# Patient Record
Sex: Male | Born: 1975 | Hispanic: No | Marital: Single | Smoking: Former smoker
Health system: Southern US, Community
[De-identification: ages and names within clinical notes are randomized; demographics above are authoritative.]

## PROBLEM LIST (undated history)

## (undated) DIAGNOSIS — I739 Peripheral vascular disease, unspecified: Secondary | ICD-10-CM

## (undated) DIAGNOSIS — E785 Hyperlipidemia, unspecified: Secondary | ICD-10-CM

## (undated) DIAGNOSIS — E119 Type 2 diabetes mellitus without complications: Secondary | ICD-10-CM

## (undated) DIAGNOSIS — I10 Essential (primary) hypertension: Secondary | ICD-10-CM

## (undated) DIAGNOSIS — F329 Major depressive disorder, single episode, unspecified: Secondary | ICD-10-CM

## (undated) HISTORY — DX: Type 2 diabetes mellitus without complications: E11.9

## (undated) HISTORY — DX: Hyperlipidemia, unspecified: E78.5

## (undated) HISTORY — DX: Peripheral vascular disease, unspecified: I73.9

## (undated) HISTORY — DX: Essential (primary) hypertension: I10

## (undated) HISTORY — DX: Major depressive disorder, single episode, unspecified: F32.9

---

## 2007-04-16 HISTORY — PX: OTHER SURGICAL HISTORY: SHX169

## 2015-01-27 ENCOUNTER — Encounter: Payer: Self-pay | Admitting: Medical

## 2015-01-27 ENCOUNTER — Ambulatory Visit (INDEPENDENT_AMBULATORY_CARE_PROVIDER_SITE_OTHER): Payer: No Typology Code available for payment source | Admitting: Medical

## 2015-01-27 VITALS — BP 150/90 | HR 93 | Temp 97.8°F | Ht 70.5 in | Wt 213.8 lb

## 2015-01-27 DIAGNOSIS — F32A Depression, unspecified: Secondary | ICD-10-CM

## 2015-01-27 DIAGNOSIS — F329 Major depressive disorder, single episode, unspecified: Secondary | ICD-10-CM

## 2015-01-27 DIAGNOSIS — E1169 Type 2 diabetes mellitus with other specified complication: Secondary | ICD-10-CM

## 2015-01-27 DIAGNOSIS — E1165 Type 2 diabetes mellitus with hyperglycemia: Secondary | ICD-10-CM | POA: Insufficient documentation

## 2015-01-27 DIAGNOSIS — E1121 Type 2 diabetes mellitus with diabetic nephropathy: Secondary | ICD-10-CM | POA: Insufficient documentation

## 2015-01-27 DIAGNOSIS — IMO0002 Reserved for concepts with insufficient information to code with codable children: Secondary | ICD-10-CM

## 2015-01-27 DIAGNOSIS — E119 Type 2 diabetes mellitus without complications: Secondary | ICD-10-CM | POA: Insufficient documentation

## 2015-01-27 DIAGNOSIS — I10 Essential (primary) hypertension: Secondary | ICD-10-CM

## 2015-01-27 DIAGNOSIS — E089 Diabetes mellitus due to underlying condition without complications: Secondary | ICD-10-CM | POA: Insufficient documentation

## 2015-01-27 DIAGNOSIS — E0841 Diabetes mellitus due to underlying condition with diabetic mononeuropathy: Secondary | ICD-10-CM

## 2015-01-27 DIAGNOSIS — E1140 Type 2 diabetes mellitus with diabetic neuropathy, unspecified: Secondary | ICD-10-CM | POA: Insufficient documentation

## 2015-01-27 DIAGNOSIS — E785 Hyperlipidemia, unspecified: Secondary | ICD-10-CM

## 2015-01-27 HISTORY — DX: Depression, unspecified: F32.A

## 2015-01-27 LAB — CBC WITH DIFFERENTIAL/PLATELET
BASOS ABS: 0 10*3/uL (ref 0.0–0.1)
Basophils Relative: 0.4 % (ref 0.0–3.0)
EOS PCT: 1.4 % (ref 0.0–5.0)
Eosinophils Absolute: 0.1 10*3/uL (ref 0.0–0.7)
HCT: 45.9 % (ref 39.0–52.0)
HEMOGLOBIN: 15.3 g/dL (ref 13.0–17.0)
LYMPHS PCT: 29.7 % (ref 12.0–46.0)
Lymphs Abs: 2.5 10*3/uL (ref 0.7–4.0)
MCHC: 33.4 g/dL (ref 30.0–36.0)
MCV: 87.6 fl (ref 78.0–100.0)
MONOS PCT: 7.4 % (ref 3.0–12.0)
Monocytes Absolute: 0.6 10*3/uL (ref 0.1–1.0)
Neutro Abs: 5.1 10*3/uL (ref 1.4–7.7)
Neutrophils Relative %: 61.1 % (ref 43.0–77.0)
Platelets: 282 10*3/uL (ref 150.0–400.0)
RBC: 5.24 Mil/uL (ref 4.22–5.81)
RDW: 14.2 % (ref 11.5–15.5)
WBC: 8.4 10*3/uL (ref 4.0–10.5)

## 2015-01-27 LAB — LIPID PANEL
CHOLESTEROL: 163 mg/dL (ref 0–200)
HDL: 33.4 mg/dL — ABNORMAL LOW (ref >39.00)
LDL Cholesterol: 106 mg/dL — ABNORMAL HIGH (ref 0–99)
NonHDL: 129.54
Total CHOL/HDL Ratio: 5
Triglycerides: 120 mg/dL (ref 0.0–149.0)
VLDL: 24 mg/dL (ref 0.0–40.0)

## 2015-01-27 LAB — COMPREHENSIVE METABOLIC PANEL
ALBUMIN: 4.2 g/dL (ref 3.5–5.2)
ALK PHOS: 87 U/L (ref 39–117)
ALT: 13 U/L (ref 0–53)
AST: 11 U/L (ref 0–37)
BUN: 14 mg/dL (ref 6–23)
CO2: 29 mEq/L (ref 19–32)
Calcium: 9.4 mg/dL (ref 8.4–10.5)
Chloride: 104 mEq/L (ref 96–112)
Creatinine, Ser: 0.77 mg/dL (ref 0.40–1.50)
GFR: 120.01 mL/min (ref >60.00)
Glucose, Bld: 171 mg/dL — ABNORMAL HIGH (ref 70–99)
POTASSIUM: 3.9 meq/L (ref 3.5–5.1)
Sodium: 141 mEq/L (ref 135–145)
TOTAL PROTEIN: 7.5 g/dL (ref 6.0–8.3)
Total Bilirubin: 0.5 mg/dL (ref 0.2–1.2)

## 2015-01-27 LAB — HEMOGLOBIN A1C: HEMOGLOBIN A1C: 9 % — AB (ref 4.6–6.5)

## 2015-01-27 MED ORDER — SERTRALINE HCL 25 MG PO TABS: 25.0000 mg | ORAL_TABLET | Freq: Every day | ORAL | Status: DC

## 2015-01-27 MED ORDER — GABAPENTIN 300 MG PO CAPS: 300.0000 mg | ORAL_CAPSULE | Freq: Three times a day (TID) | ORAL | Status: DC

## 2015-01-27 MED ORDER — LISINOPRIL 10 MG PO TABS: 10.0000 mg | ORAL_TABLET | Freq: Every day | ORAL | Status: DC

## 2015-01-27 NOTE — Patient Instructions (Addendum)
Diabetes mellitus type II, uncontrolled (HCC) Since 2007. 6 months ago he started to get some blurred vision. Pt states local clinic has him on metformin 500 ER 2 tabs  a day. Also he is on glipizide 10 mg a day.  Will get a1c and cmp today. Then adjust meds accordingly.  Diabetic neuropathy (HCC) Refill gabapentin.  Depression Rx sertraline 25 mg tab. May titrate up if needed.  HTN (hypertension) Pt is already on hctz. I will add lisinopril. May need to change hctz in future due to possible increase bs effect.   Your ha mild daily I think is from htn. If you get ha that worsens/severe or neurologic type signs or symptoms then ED evaluation.  Follow up in 1 month or as needed

## 2015-01-27 NOTE — Assessment & Plan Note (Signed)
Since 2007. 6 months ago he started to get some blurred vision. Pt states local clinic has him on metformin 500 ER 2 tabs  a day. Also he is on glipizide 10 mg a day.  Will get a1c and cmp today. Then adjust meds accordingly.

## 2015-01-27 NOTE — Assessment & Plan Note (Signed)
Refill gabapentin 

## 2015-01-27 NOTE — Assessment & Plan Note (Signed)
Rx sertraline 25 mg tab. May titrate up if needed.

## 2015-01-27 NOTE — Assessment & Plan Note (Signed)
Pt is already on hctz. I will add lisinopril. May need to change hctz in future due to possible increase bs effect.

## 2015-01-27 NOTE — Progress Notes (Signed)
Subjective:    Patient ID: Thomas [name], male    DOB: 1976-03-24, 39 y.o.   MRN: [id number]  HPI   I have reviewed pt PMH, PSH, FH, Social History and Surgical History.  Diabetes II- Since 2007. 6 months ago he started to get some blurred vision that occurs transiently. Pt states local clinic has him on metformin 500 ER 2 tabs  a day. Also he is on glipizide 10 mg a day.   Pt has some diabetic neuropathy type pain also. Pain since 2013. He is on gabapentin. But his symptoms persist. Current dose sedates him. Pt has orange card for his rx.  Pt [facility].  Pt states in summer he was hospitalized due to high blood sugar.  Pt blood sugar this am was 208. Highest last week was 280. Other readings were 120 to 170 range.   Pt states if does not eat at night his blood sugars are controlled next am.  Pt has some loose teeth and he was scheduled to see dentist.  Pt not sure if he is on bp medication. He states daily low level HA since April. He does not check his bp. He states he has no bp device. No gross motor or sensory function on review today.(see below neuro exam)  Unemployed. Pt former Curator, used to drive fork lift, No exercise, 3 days a week starbucks coffee, single. Speaks Arabic. Pt is from [place]. Friend is with him who translates.     Review of Systems  Constitutional: Negative for fever, chills and fatigue.  Respiratory: Negative for cough, chest tightness and shortness of breath.   Cardiovascular: Negative for chest pain and palpitations.  Gastrointestinal: Negative for abdominal pain, constipation, blood in stool and abdominal distention.  Endocrine: Positive for polydipsia, polyphagia and polyuria.  Musculoskeletal: Negative for back pain.  Neurological: Positive for headaches. Negative for dizziness, syncope, weakness, light-headedness and numbness.  Hematological: Negative for adenopathy. Does not bruise/bleed easily.    Psychiatric/Behavioral: Positive for dysphoric mood. Negative for behavioral problems and confusion. The patient is not nervous/anxious.        Pt states depressed since 2013. Occurred when he was let go. A lot of coworkers discriminate against him. Per his report.     Past Medical History  Diagnosis Date  . Diabetes mellitus without complication (HCC)   . Hyperlipidemia   . Hypertension     Social History   Social History  . Marital Status: Single    Spouse Name: N/A  . Number of Children: N/A  . Years of Education: N/A   Occupational History  . Not on file.   Social History Main Topics  . Smoking status: Current Every Day Smoker  . Smokeless tobacco: Never Used  . Alcohol Use: No  . Drug Use: No  . Sexual Activity: Not on file   Other Topics Concern  . Not on file   Social History Narrative  . No narrative on file    Past Surgical History  Procedure Laterality Date  . Kidney stones  2009    History reviewed. No pertinent family history.  No Known Allergies  No current outpatient prescriptions on file prior to visit.   No current facility-administered medications on file prior to visit.    BP 150/90 mmHg  Pulse 93  Temp(Src) 97.8 F (36.6 C) (Oral)  Ht 5' 10.5" (1.791 m)  Wt 213 lb 12.8 oz (96.979 kg)  BMI 30.23 kg/m2  SpO2 98%  Objective:   Physical Exam  General Mental Status- Alert. General Appearance- Not in acute distress.   Mouth- poor dentition  Skin General: Color- Normal Color. Moisture- Normal Moisture.  Neck Carotid Arteries- Normal color. Moisture- Normal Moisture. No carotid bruits. No JVD.  Chest and Lung Exam Auscultation: Breath Sounds:-Normal.  Cardiovascular Auscultation:Rythm- Regular, Rate and Rythm Murmurs & Other Heart Sounds:Auscultation of the heart reveals- No Murmurs.  Abdomen Inspection:-Inspeection Normal. Palpation/Percussion:Note:No mass. Palpation and Percussion of the abdomen reveal- Non  Tender, Non Distended + BS, no rebound or guarding.    Neurologic Cranial Nerve exam:- CN III-XII intact(No nystagmus), symmetric smile. Drift Test:- No drift. Finger to Nose:- Normal/Intact Strength:- 5/5 equal and symmetric strength both upper and lower extremities.  Feet exam- see quality metrics. But no lesions or breakdown either side.      Assessment & Plan:  TranslatorMorene Rankins- Majeed Mahmood [phone].

## 2015-01-27 NOTE — Progress Notes (Signed)
Pre visit review using our clinic review tool, if applicable. No additional management support is needed unless otherwise documented below in the visit note. 

## 2015-01-29 ENCOUNTER — Telehealth: Payer: Self-pay | Admitting: Medical

## 2015-01-29 MED ORDER — METFORMIN HCL ER 750 MG PO TB24: ORAL_TABLET | ORAL | Status: DC

## 2015-01-29 NOTE — Telephone Encounter (Signed)
I sent in higher dose metformin to his pharmacy. Stop former lower dose.

## 2015-01-30 NOTE — Telephone Encounter (Signed)
Spoke with pt's friend and he voices understanding.

## 2015-02-23 ENCOUNTER — Encounter: Payer: Self-pay | Admitting: Medical

## 2015-02-23 ENCOUNTER — Ambulatory Visit (INDEPENDENT_AMBULATORY_CARE_PROVIDER_SITE_OTHER): Payer: No Typology Code available for payment source | Admitting: Medical

## 2015-02-23 VITALS — BP 140/90 | HR 89 | Temp 98.3°F | Ht 70.5 in | Wt 211.0 lb

## 2015-02-23 DIAGNOSIS — E1169 Type 2 diabetes mellitus with other specified complication: Secondary | ICD-10-CM

## 2015-02-23 DIAGNOSIS — IMO0002 Reserved for concepts with insufficient information to code with codable children: Secondary | ICD-10-CM

## 2015-02-23 DIAGNOSIS — E785 Hyperlipidemia, unspecified: Secondary | ICD-10-CM

## 2015-02-23 DIAGNOSIS — F32A Depression, unspecified: Secondary | ICD-10-CM

## 2015-02-23 DIAGNOSIS — F329 Major depressive disorder, single episode, unspecified: Secondary | ICD-10-CM

## 2015-02-23 DIAGNOSIS — I10 Essential (primary) hypertension: Secondary | ICD-10-CM

## 2015-02-23 DIAGNOSIS — E1165 Type 2 diabetes mellitus with hyperglycemia: Secondary | ICD-10-CM

## 2015-02-23 MED ORDER — LISINOPRIL 20 MG PO TABS: 20.0000 mg | ORAL_TABLET | Freq: Every day | ORAL | Status: DC

## 2015-02-23 MED ORDER — SERTRALINE HCL 50 MG PO TABS: 50.0000 mg | ORAL_TABLET | Freq: Every day | ORAL | Status: DC

## 2015-02-23 MED ORDER — CANAGLIFLOZIN 100 MG PO TABS: 100.0000 mg | ORAL_TABLET | Freq: Every day | ORAL | Status: DC

## 2015-02-23 NOTE — Progress Notes (Signed)
Subjective:    Patient ID: Thomas [name], male    DOB: 03/27/1976, 39 y.o.   MRN: [id number]  HPI Pt in today with friend who translates.  Pt last a1-c was 9.0. Sugar levels  not controlled. I increased his metformin to 750 mg bid(pharmacy did not give him the 850 mg as I rx'd) from  previous 500 mg bid. Considered use of invokana if sugars not improving.  Pt test blood sugar in am. Sometimes blood sugar 200 . At night blood sugar sometimes over 200. He describes always has some high blood sugar.   Pt had low hdl on last lipid panel and mild high ldl.   Pt bp was 150/90 last time. 140/90 today first check by MA. No cardiac or neurologic symptoms presently.  Pt has some depression on last visit. I rx'd sertraline. Pt states he still feels same level of depression. I started him on the low dose sertraline.   Pt states neurontin helps little but not sure. It sedates him too much and he will sleep.       Review of Systems  Constitutional: Negative for fever, chills, diaphoresis, activity change and fatigue.  Respiratory: Negative for cough, chest tightness and shortness of breath.   Cardiovascular: Negative for chest pain, palpitations and leg swelling.  Gastrointestinal: Negative for nausea, vomiting and abdominal pain.  Musculoskeletal: Negative for neck pain and neck stiffness.  Neurological: Positive for dizziness. Negative for tremors, seizures, syncope, facial asymmetry, speech difficulty, weakness, light-headedness, numbness and headaches.       He gets intermittently dizzy. States very frequent.  Transient blurred vision. For 3-4 months.   Pt told by optometrist  vision 20/20. They did not see any problem.  Hematological:       About same not worse.  Psychiatric/Behavioral: Positive for dysphoric mood. Negative for suicidal ideas, behavioral problems, confusion, sleep disturbance and agitation. The patient is not nervous/anxious.     Past Medical History  Diagnosis  Date  . Diabetes mellitus without complication (HCC)   . Hyperlipidemia   . Hypertension   . Depression 01/27/2015    Social History   Social History  . Marital Status: Single    Spouse Name: N/A  . Number of Children: N/A  . Years of Education: N/A   Occupational History  . Not on file.   Social History Main Topics  . Smoking status: Current Every Day Smoker  . Smokeless tobacco: Never Used  . Alcohol Use: No  . Drug Use: No  . Sexual Activity: Not on file   Other Topics Concern  . Not on file   Social History Narrative    Past Surgical History  Procedure Laterality Date  . Kidney stones  2009    No family history on file.  No Known Allergies  Current Outpatient Prescriptions on File Prior to Visit  Medication Sig Dispense Refill  . gabapentin (NEURONTIN) 300 MG capsule Take 1 capsule (300 mg total) by mouth 3 (three) times daily. 90 capsule 1  . glipiZIDE (GLUCOTROL) 10 MG tablet Take 10 mg by mouth daily before breakfast.    . hydrochlorothiazide (HYDRODIURIL) 25 MG tablet Take 25 mg by mouth daily. Take 1 tablet daily after breakfast.    . lisinopril (PRINIVIL,ZESTRIL) 10 MG tablet Take 1 tablet (10 mg total) by mouth daily. 30 tablet 3  . lovastatin (MEVACOR) 40 MG tablet Take 40 mg by mouth at bedtime.    . metFORMIN (GLUCOPHAGE XR) 750 MG 24 hr tablet  1 tab po bid 180 tablet 1  . sertraline (ZOLOFT) 25 MG tablet Take 1 tablet (25 mg total) by mouth daily. 30 tablet 0   No current facility-administered medications on file prior to visit.    BP 140/90 mmHg  Pulse 89  Temp(Src) 98.3 F (36.8 C) (Oral)  Ht 5' 10.5" (1.791 m)  Wt 211 lb (95.709 kg)  BMI 29.84 kg/m2  SpO2 98%       Objective:   Physical Exam  General Mental Status- Alert. General Appearance- Not in acute distress.   Skin General: Color- Normal Color. Moisture- Normal Moisture.  Neck Carotid Arteries- Normal color. Moisture- Normal Moisture. No carotid bruits. No  JVD.  Chest and Lung Exam Auscultation: Breath Sounds:-Normal.  Cardiovascular Auscultation:Rythm- Regular. Murmurs & Other Heart Sounds:Auscultation of the heart reveals- No Murmurs.  Abdomen Inspection:-Inspeection Normal. Palpation/Percussion:Note:No mass. Palpation and Percussion of the abdomen reveal- Non Tender, Non Distended + BS, no rebound or guarding.    Neurologic Cranial Nerve exam:- CN III-XII intact(No nystagmus), symmetric smile. Strength:- 5/5 equal and symmetric strength both upper and lower extremities.      Assessment & Plan:  For diabetes will rx invokana. Continue metformin and glucotrol. Low sugar diet.  For your htn I am going to increase your lisinopril to 20 mg a day.  For your depression will increase sertraline to 50 mg a day.  For neuralgia can continue with 100 mg q hs for neuralgia. But not more since sedates him excessively.  Follow up in one month or as needed.  Note his occasional dizziness intermittently I think may be related to high blood sugar. He had negative neuro exam. Not symptomatic during interview.

## 2015-02-23 NOTE — Patient Instructions (Addendum)
For diabetes will rx invokana. Continue metformin and glucotrol. Low sugar diet.  For your htn I am going to increase your lisinopril to 20 mg a day.  For your depression will increase sertraline to 50 mg a day.  For neuralgia can continue with 100 mg q hs for neuralgia. But not more since sedates him excessively.  Follow up in one month or as needed

## 2015-02-23 NOTE — Progress Notes (Signed)
Pre visit review using our clinic review tool, if applicable. No additional management support is needed unless otherwise documented below in the visit note. 

## 2015-02-27 ENCOUNTER — Ambulatory Visit: Payer: No Typology Code available for payment source | Admitting: Medical

## 2015-03-23 ENCOUNTER — Ambulatory Visit (INDEPENDENT_AMBULATORY_CARE_PROVIDER_SITE_OTHER): Payer: No Typology Code available for payment source | Admitting: Medical

## 2015-03-23 ENCOUNTER — Encounter: Payer: Self-pay | Admitting: Medical

## 2015-03-23 VITALS — BP 122/80 | HR 83 | Temp 97.8°F | Ht 70.5 in | Wt 211.0 lb

## 2015-03-23 DIAGNOSIS — E1165 Type 2 diabetes mellitus with hyperglycemia: Secondary | ICD-10-CM

## 2015-03-23 DIAGNOSIS — IMO0002 Reserved for concepts with insufficient information to code with codable children: Secondary | ICD-10-CM

## 2015-03-23 DIAGNOSIS — E1169 Type 2 diabetes mellitus with other specified complication: Secondary | ICD-10-CM

## 2015-03-23 DIAGNOSIS — F32A Depression, unspecified: Secondary | ICD-10-CM

## 2015-03-23 DIAGNOSIS — F329 Major depressive disorder, single episode, unspecified: Secondary | ICD-10-CM

## 2015-03-23 DIAGNOSIS — I10 Essential (primary) hypertension: Secondary | ICD-10-CM

## 2015-03-23 MED ORDER — LISINOPRIL 20 MG PO TABS: 20.0000 mg | ORAL_TABLET | Freq: Every day | ORAL | Status: DC

## 2015-03-23 MED ORDER — GLIPIZIDE 10 MG PO TABS: 10.0000 mg | ORAL_TABLET | Freq: Every day | ORAL | Status: DC

## 2015-03-23 MED ORDER — CANAGLIFLOZIN 100 MG PO TABS: 100.0000 mg | ORAL_TABLET | Freq: Every day | ORAL | Status: DC

## 2015-03-23 MED ORDER — SERTRALINE HCL 50 MG PO TABS: 50.0000 mg | ORAL_TABLET | Freq: Every day | ORAL | Status: DC

## 2015-03-23 NOTE — Patient Instructions (Addendum)
For diabetes will continue current treatment metofrmin and glipizide. Will send rx of invokana down stairs to our pharmacy. It may be cheaper. May refer to endocrinologist since they may have samples of diabetic meds. Discussed you could possibly use invokana from your home country but I can officially endorse that. Get urine microalbumin today.  Depression much improved will rx sertlane refill.  Your blood pressure is controlled today. Will refill.  Regarding your blurred vision and double vision. Last eye md told you diabetic and htn effect. We will get second opinion in January. If any acute vision changes before that appointment then ED evaluation  Follow up 1 month or as needed

## 2015-03-23 NOTE — Progress Notes (Signed)
   Subjective:    Patient ID: Thomas CurdHaider [name], male    DOB: [birth year], 39 y.o.   MRN: [id number]  HPI  Pt is in he did not get invokana due to cost $400 dollars. He is on metformin and glucotrol. Pt has been checking his blood sugars. Bs 200 at times. Then later will check and 185. Pt still getting dizzy intermitently. Not severe or constant. No associated neurologic signs or symptoms.   Pt has appointment  Eye doctor January 5th.   Pt blood pressure is improved today. No cardiac or neurologic signs or symptoms.  Pt mood is better with the sertraline increase of dose.  Pt not exercising.  Pt states hx of 3-4 months rt eye double vision. Opthamlogist told him was htn and diabetes effect. Pt has diabetic eye check in one month.   Review of Systems  Constitutional: Negative for fever, chills, diaphoresis, activity change and fatigue.  Eyes: Positive for visual disturbance.       See hpi.  Respiratory: Negative for cough, chest tightness and shortness of breath.   Cardiovascular: Negative for chest pain, palpitations and leg swelling.  Gastrointestinal: Negative for nausea, vomiting and abdominal pain.  Musculoskeletal: Negative for neck pain and neck stiffness.  Neurological: Negative for dizziness, seizures, syncope and numbness.  Psychiatric/Behavioral: Negative for behavioral problems, confusion, dysphoric mood and agitation. The patient is not nervous/anxious.        Much better mood happier.       Objective:   Physical Exam  General Mental Status- Alert. General Appearance- Not in acute distress. Pt from [place]. His friend translates today.  Eyes- Perrl bilateral . Normal conjunctiva.   Skin General: Color- Normal Color. Moisture- Normal Moisture.  Neck Carotid Arteries- Normal color. Moisture- Normal Moisture. No carotid bruits. No JVD.  Chest and Lung Exam Auscultation: Breath Sounds:-Normal.  Cardiovascular Auscultation:Rythm- Regular. Murmurs & Other Heart  Sounds:Auscultation of the heart reveals- No Murmurs.  Abdomen Inspection:-Inspeection Normal. Palpation/Percussion:Note:No mass. Palpation and Percussion of the abdomen reveal- Non Tender, Non Distended + BS, no rebound or guarding.    Neurologic Cranial Nerve exam:- CN III-XII intact(No nystagmus), symmetric smile. Strength:- 5/5 equal and symmetric strength both upper and lower extremities.      Assessment & Plan:  For diabetes will continue current treatment metofrmin and glipizide. Will send rx of invokana down stairs to our pharmacy. It may be cheaper. May refer to endocrinologist since they may have samples of diabetic meds. Discussed you could possibly use invokana from your home country but I can officially endorse that. Get urine microalbumin today.  Depression much improved will rx sertlane refill.  Your blood pressure is controlled today. Will refill.  Regarding your blurred vision and double vision. Last eye md told you diabetic and htn effect. We will get second opinion in January. If any acute vision changes before that appointment then ED evaluation  Follow up 1 month or as needed

## 2015-03-23 NOTE — Progress Notes (Signed)
Pre visit review using our clinic review tool, if applicable. No additional management support is needed unless otherwise documented below in the visit note. 

## 2015-03-24 LAB — MICROALBUMIN, URINE: Microalb, Ur: 51.8 mg/dL

## 2015-03-27 ENCOUNTER — Telehealth: Payer: Self-pay

## 2015-03-27 NOTE — Telephone Encounter (Signed)
Pt son, Thomas [name], called back for lab results. He said he is the pts translator and did not speak to anyone. Per notes pt and translator were informed. He is requesting call back at [phone]. Per Ronny FlurryHaider gave Verbal okay to give results to son and he gave me ok on 03/27/15 at 4:50pm. Results given to son. Patient was made aware that a copy of results were in the mail.

## 2015-04-20 LAB — HM DIABETES EYE EXAM

## 2015-04-24 ENCOUNTER — Ambulatory Visit: Payer: No Typology Code available for payment source | Admitting: Medical

## 2015-04-26 ENCOUNTER — Telehealth: Payer: Self-pay | Admitting: Medical

## 2015-04-26 ENCOUNTER — Encounter: Payer: Self-pay | Admitting: Medical

## 2015-04-26 ENCOUNTER — Ambulatory Visit (INDEPENDENT_AMBULATORY_CARE_PROVIDER_SITE_OTHER): Payer: No Typology Code available for payment source | Admitting: Medical

## 2015-04-26 VITALS — BP 130/88 | HR 94 | Temp 97.4°F | Ht 71.0 in | Wt 214.6 lb

## 2015-04-26 DIAGNOSIS — IMO0002 Reserved for concepts with insufficient information to code with codable children: Secondary | ICD-10-CM

## 2015-04-26 DIAGNOSIS — I10 Essential (primary) hypertension: Secondary | ICD-10-CM

## 2015-04-26 DIAGNOSIS — E11319 Type 2 diabetes mellitus with unspecified diabetic retinopathy without macular edema: Secondary | ICD-10-CM

## 2015-04-26 DIAGNOSIS — E1165 Type 2 diabetes mellitus with hyperglycemia: Secondary | ICD-10-CM

## 2015-04-26 DIAGNOSIS — E785 Hyperlipidemia, unspecified: Secondary | ICD-10-CM

## 2015-04-26 DIAGNOSIS — E1169 Type 2 diabetes mellitus with other specified complication: Secondary | ICD-10-CM

## 2015-04-26 LAB — COMPREHENSIVE METABOLIC PANEL
ALT: 16 U/L (ref 0–53)
AST: 12 U/L (ref 0–37)
Albumin: 4.6 g/dL (ref 3.5–5.2)
Alkaline Phosphatase: 93 U/L (ref 39–117)
BUN: 26 mg/dL — AB (ref 6–23)
CHLORIDE: 100 meq/L (ref 96–112)
CO2: 31 meq/L (ref 19–32)
CREATININE: 0.9 mg/dL (ref 0.40–1.50)
Calcium: 10.2 mg/dL (ref 8.4–10.5)
GFR: 100.11 mL/min (ref >60.00)
GLUCOSE: 229 mg/dL — AB (ref 70–99)
Potassium: 4.2 mEq/L (ref 3.5–5.1)
SODIUM: 137 meq/L (ref 135–145)
Total Bilirubin: 0.4 mg/dL (ref 0.2–1.2)
Total Protein: 7.6 g/dL (ref 6.0–8.3)

## 2015-04-26 LAB — HEMOGLOBIN A1C: Hgb A1c MFr Bld: 9.5 % — ABNORMAL HIGH (ref 4.6–6.5)

## 2015-04-26 NOTE — Patient Instructions (Addendum)
For diabetes continue with metformin, glipizide and invokana.   Pt given info on maybe getting med discounted price.  Continue with current bp meds. Bp is controlled.  Will refer pt to ophthalmologist for diabetic retinopathy.  Follow up in 3 months or as needed  Also gave [facility] number [phone].

## 2015-04-26 NOTE — Progress Notes (Signed)
Subjective:    Patient ID: Thomas [name], male    DOB: [birth year], 40 y.o.   MRN: [id number]  HPI   Pt in for follow up. Has IT trainer.   Pt did go to eye Doctor. Pt went to optometrist. And he was told needs to see ophthalmologist.  Pt blood pressure good today. Better than last time. He does not check. No cardiac or neurologic signs or symptoms.  Pt sugar levels at home 130-150.  Pt had invokona on last visit. It was free on last visit. He was given some info on how he could get it cheaper after free month runs out. Also on glucotrol and metformin. He states he feels better with invokana and sugars controlled.  Pt mood is still good with sertraline. He is not reporting depression. Currently on sertraline.  Pt has loose teeth. Dentist recommended pulling at least 3 teeth. Pt declines stating he is too young and cost too much.  Pt will need some documents on earnings etc  in order to get discounted price of invokana.       Review of Systems  Constitutional: Negative for fever, chills, activity change and fatigue.  Respiratory: Negative for cough, chest tightness and shortness of breath.   Cardiovascular: Negative for chest pain, palpitations and leg swelling.  Gastrointestinal: Negative for nausea, vomiting and abdominal pain.  Musculoskeletal: Negative for neck pain and neck stiffness.  Neurological: Negative for dizziness, weakness and headaches.  Psychiatric/Behavioral: Negative for behavioral problems, confusion and agitation. The patient is not nervous/anxious.        Mood better.   Past Medical History  Diagnosis Date  . Diabetes mellitus without complication (HCC)   . Hyperlipidemia   . Hypertension   . Depression 01/27/2015    Social History   Social History  . Marital Status: Single    Spouse Name: N/A  . Number of Children: N/A  . Years of Education: N/A   Occupational History  . Not on file.   Social History Main Topics  . Smoking  status: Current Every Day Smoker  . Smokeless tobacco: Never Used  . Alcohol Use: No  . Drug Use: No  . Sexual Activity: Not on file   Other Topics Concern  . Not on file   Social History Narrative    Past Surgical History  Procedure Laterality Date  . Kidney stones  2009    No family history on file.  No Known Allergies  Current Outpatient Prescriptions on File Prior to Visit  Medication Sig Dispense Refill  . canagliflozin (INVOKANA) 100 MG TABS tablet Take 1 tablet (100 mg total) by mouth daily before breakfast. 30 tablet 3  . doxycycline (VIBRA-TABS) 100 MG tablet TK 1 T PO  D  0  . gabapentin (NEURONTIN) 300 MG capsule Take 1 capsule (300 mg total) by mouth 3 (three) times daily. 90 capsule 1  . glipiZIDE (GLUCOTROL) 10 MG tablet Take 1 tablet (10 mg total) by mouth daily before breakfast. 30 tablet 3  . hydrochlorothiazide (HYDRODIURIL) 25 MG tablet Take 25 mg by mouth daily. Take 1 tablet daily after breakfast.    . lisinopril (PRINIVIL,ZESTRIL) 20 MG tablet Take 1 tablet (20 mg total) by mouth daily. 30 tablet 5  . lovastatin (MEVACOR) 40 MG tablet Take 40 mg by mouth at bedtime.    . metFORMIN (GLUCOPHAGE XR) 750 MG 24 hr tablet 1 tab po bid 180 tablet 1  . sertraline (ZOLOFT) 50 MG tablet Take 1 tablet (  50 mg total) by mouth daily. 30 tablet 3   No current facility-administered medications on file prior to visit.    BP 130/88 mmHg  Pulse 94  Temp(Src) 97.4 F (36.3 C) (Oral)  Ht 5\' 11"  (1.803 m)  Wt 214 lb 9.6 oz (97.342 kg)  BMI 29.94 kg/m2  SpO2 98%       Objective:   Physical Exam   Expand All Collapse All     Subjective:    Patient ID: Thomas [name], male DOB: [birth year], 40 y.o. MRN: [id number]  HPI  Pt is in he did not get invokana due to cost $400 dollars. He is on metformin and glucotrol. Pt has been checking his blood sugars. Bs 200 at times. Then later will check and 185. Pt still getting dizzy intermitently. Not severe or  constant. No associated neurologic signs or symptoms.   Pt has appointment Eye doctor January 5th.   Pt blood pressure is improved today. No cardiac or neurologic signs or symptoms.  Pt mood is better with the sertraline increase of dose.  Pt not exercising.  Pt states hx of 3-4 months rt eye double vision. Opthamlogist told him was htn and diabetes effect. Pt has diabetic eye check in one month.   Review of Systems  Constitutional: Negative for fever, chills, diaphoresis, activity change and fatigue.  Eyes: Positive for visual disturbance.   See hpi.  Respiratory: Negative for cough, chest tightness and shortness of breath.  Cardiovascular: Negative for chest pain, palpitations and leg swelling.  Gastrointestinal: Negative for nausea, vomiting and abdominal pain.  Musculoskeletal: Negative for neck pain and neck stiffness.  Neurological: Negative for dizziness, seizures, syncope and numbness.  Psychiatric/Behavioral: Negative for behavioral problems, confusion, dysphoric mood and agitation. The patient is not nervous/anxious.   Much better mood happier.       Objective:   Physical Exam  General Mental Status- Alert. General Appearance- Not in acute distress. Pt from [place]. His friend translates today.  Mouth- 3 front teeth loose.  Eyes- Perrl bilateral . Normal conjunctiva.   Skin General: Color- Normal Color. Moisture- Normal Moisture.  Neck Carotid Arteries- Normal color. Moisture- Normal Moisture. No carotid bruits. No JVD.  Chest and Lung Exam Auscultation: Breath Sounds:-Normal.  Cardiovascular Auscultation:Rythm- Regular. Murmurs & Other Heart Sounds:Auscultation of the heart reveals- No Murmurs.  Abdomen Inspection:-Inspeection Normal. Palpation/Percussion:Note:No mass. Palpation and Percussion of the abdomen reveal- Non Tender, Non Distended + BS, no rebound or guarding.    Neurologic Cranial Nerve exam:- CN III-XII intact(No  nystagmus), symmetric smile. Strength:- 5/5 equal and symmetric strength both upper and lower extremities.             Assessment & Plan:  [phone](Yasser friend who translates)  For diabetes continue with metformin, glipizide and invokana. (I talked with pharmacist today to see if he can get invokana free again). Not option today. He needs to fill out forms and call number given by pharmacist. His English speaking friend states will help.  Pt given info on maybe getting med discounted price.  Continue with current bp meds. Bp is controlled.  Will refer pt to ophthalmologist for diabetic retinopathy.  Follow up in 3 months or as needed  Also gave [facility] wellness number [phone].

## 2015-04-26 NOTE — Telephone Encounter (Signed)
I thinks 05-08-15 date ok. At end of today note 04-26-2015. Number of friend who comes with him that speaks english. You can give him info on appointment. His name is Production managerYasser.

## 2015-04-26 NOTE — Progress Notes (Signed)
Pre visit review using our clinic review tool, if applicable. No additional management support is needed unless otherwise documented below in the visit note. 

## 2015-04-27 ENCOUNTER — Encounter: Payer: Self-pay | Admitting: Medical

## 2015-04-28 NOTE — Telephone Encounter (Signed)
Error

## 2015-05-26 ENCOUNTER — Other Ambulatory Visit: Payer: Self-pay

## 2015-05-26 MED ORDER — LOVASTATIN 40 MG PO TABS: 40.0000 mg | ORAL_TABLET | Freq: Every day | ORAL | Status: DC

## 2015-05-26 NOTE — Telephone Encounter (Signed)
Rx filled and sent to pharmacy and enough refills until pt comes in for an appointment in April.

## 2015-06-22 ENCOUNTER — Telehealth: Payer: Self-pay

## 2015-06-22 NOTE — Telephone Encounter (Signed)
Last refill was 01/29/15 for 750mg  Pt was last seen 04/26/15, Last dose on file is 750 mg  Twice a day.  Refill request was for 500 mg ER bid.  Please advise.

## 2015-06-22 NOTE — Telephone Encounter (Signed)
He needs to be on 750 mg twice daily. Not 500 mg Please clarify this with company that sent the fax to us.

## 2015-06-23 ENCOUNTER — Telehealth: Payer: Self-pay | Admitting: Medical

## 2015-06-23 MED ORDER — METFORMIN HCL ER 750 MG PO TB24: ORAL_TABLET | ORAL | Status: DC

## 2015-06-23 NOTE — Telephone Encounter (Signed)
Call pt pharmacy. I think they gave him metformin xr 500 mg  because they don't cary 750 mg tab. You could call him in same dose as he is on now. One month supply but he is due for a1-c 07-25-2015. So Please call him and ask him to come in for 30 minute visit. He needs a Nurse, learning disabilitytranslator.

## 2015-06-23 NOTE — Telephone Encounter (Signed)
Left message for pt's translator to call the office to set up an appointment. Pt will need to be in a 30 min slot.

## 2015-06-23 NOTE — Telephone Encounter (Signed)
Sent new Rx to pharmacy and Misty StanleyLisa at pharmacy was aware of the change.

## 2015-06-23 NOTE — Telephone Encounter (Signed)
Larita FifeLynn from pharmacy called in to make PCP aware that the reason that the reason that they request a lower dosage is because the dosage pcp is prescribing cost more. Pharmacist is requesting a call back to discuss further.     CB: [phone]

## 2015-07-20 ENCOUNTER — Other Ambulatory Visit: Payer: Self-pay | Admitting: Medical

## 2015-07-25 ENCOUNTER — Encounter: Payer: Self-pay | Admitting: Medical

## 2015-07-25 ENCOUNTER — Ambulatory Visit (INDEPENDENT_AMBULATORY_CARE_PROVIDER_SITE_OTHER): Payer: No Typology Code available for payment source | Admitting: Medical

## 2015-07-25 VITALS — BP 118/80 | HR 78 | Temp 98.3°F | Ht 71.0 in | Wt 211.8 lb

## 2015-07-25 DIAGNOSIS — F32A Depression, unspecified: Secondary | ICD-10-CM

## 2015-07-25 DIAGNOSIS — I10 Essential (primary) hypertension: Secondary | ICD-10-CM

## 2015-07-25 DIAGNOSIS — E785 Hyperlipidemia, unspecified: Secondary | ICD-10-CM

## 2015-07-25 DIAGNOSIS — K140 Glossitis: Secondary | ICD-10-CM

## 2015-07-25 DIAGNOSIS — F329 Major depressive disorder, single episode, unspecified: Secondary | ICD-10-CM

## 2015-07-25 DIAGNOSIS — IMO0002 Reserved for concepts with insufficient information to code with codable children: Secondary | ICD-10-CM

## 2015-07-25 DIAGNOSIS — E1169 Type 2 diabetes mellitus with other specified complication: Secondary | ICD-10-CM

## 2015-07-25 DIAGNOSIS — E1165 Type 2 diabetes mellitus with hyperglycemia: Secondary | ICD-10-CM

## 2015-07-25 LAB — CBC WITH DIFFERENTIAL/PLATELET
BASOS ABS: 0 10*3/uL (ref 0.0–0.1)
Basophils Relative: 0.4 % (ref 0.0–3.0)
Eosinophils Absolute: 0.1 10*3/uL (ref 0.0–0.7)
Eosinophils Relative: 1.4 % (ref 0.0–5.0)
HCT: 46.7 % (ref 39.0–52.0)
Hemoglobin: 15.9 g/dL (ref 13.0–17.0)
LYMPHS ABS: 3.1 10*3/uL (ref 0.7–4.0)
Lymphocytes Relative: 28.3 % (ref 12.0–46.0)
MCHC: 33.9 g/dL (ref 30.0–36.0)
MCV: 85.9 fl (ref 78.0–100.0)
MONO ABS: 0.7 10*3/uL (ref 0.1–1.0)
Monocytes Relative: 6.3 % (ref 3.0–12.0)
NEUTROS ABS: 6.9 10*3/uL (ref 1.4–7.7)
NEUTROS PCT: 63.6 % (ref 43.0–77.0)
PLATELETS: 274 10*3/uL (ref 150.0–400.0)
RBC: 5.44 Mil/uL (ref 4.22–5.81)
RDW: 13.9 % (ref 11.5–15.5)
WBC: 10.8 10*3/uL — ABNORMAL HIGH (ref 4.0–10.5)

## 2015-07-25 LAB — COMPREHENSIVE METABOLIC PANEL
ALBUMIN: 4.5 g/dL (ref 3.5–5.2)
ALK PHOS: 90 U/L (ref 39–117)
ALT: 25 U/L (ref 0–53)
AST: 16 U/L (ref 0–37)
BILIRUBIN TOTAL: 0.4 mg/dL (ref 0.2–1.2)
BUN: 18 mg/dL (ref 6–23)
CO2: 28 meq/L (ref 19–32)
Calcium: 9.8 mg/dL (ref 8.4–10.5)
Chloride: 100 mEq/L (ref 96–112)
Creatinine, Ser: 0.83 mg/dL (ref 0.40–1.50)
GFR: 109.78 mL/min (ref >60.00)
GLUCOSE: 251 mg/dL — AB (ref 70–99)
Potassium: 4.2 mEq/L (ref 3.5–5.1)
Sodium: 136 mEq/L (ref 135–145)
TOTAL PROTEIN: 7.6 g/dL (ref 6.0–8.3)

## 2015-07-25 LAB — LIPID PANEL
CHOL/HDL RATIO: 7
CHOLESTEROL: 228 mg/dL — AB (ref 0–200)
HDL: 34.1 mg/dL — ABNORMAL LOW (ref >39.00)
NonHDL: 193.47
TRIGLYCERIDES: 268 mg/dL — AB (ref 0.0–149.0)
VLDL: 53.6 mg/dL — ABNORMAL HIGH (ref 0.0–40.0)

## 2015-07-25 LAB — LDL CHOLESTEROL, DIRECT: Direct LDL: 109 mg/dL

## 2015-07-25 LAB — VITAMIN B12: Vitamin B-12: 448 pg/mL (ref 211–911)

## 2015-07-25 LAB — HEMOGLOBIN A1C: HEMOGLOBIN A1C: 11.6 % — AB (ref 4.6–6.5)

## 2015-07-25 MED ORDER — GLIPIZIDE 10 MG PO TABS: 10.0000 mg | ORAL_TABLET | Freq: Every day | ORAL | Status: DC

## 2015-07-25 MED ORDER — METFORMIN HCL ER 500 MG PO TB24: ORAL_TABLET | ORAL | Status: DC

## 2015-07-25 NOTE — Progress Notes (Signed)
Subjective:    Patient ID: Thomas [name], male    DOB: [birth year], 41 y.o.   MRN: [id number]  HPI  Pt a1-c  In January was about  225. Previous a1c showing average blood sugar about 215.  Pt admits he has not been able to exercise.     Pt is taking metfromin twice a day. Also on glucotrol 10 mg a day. He was not able to get invokana 100 mg a day. It would cost him $400. Pt state drug manufacture was going to send [place] information for [place] to fill out. But pt also has language barrier. He come in with his translator. So it is unclear if he every filled his forms?  Pt states his mood is controlled and he no longer wants to use sertraline. He is happy now and in a good mood.  Pt in past he had some neuropathy. He had been on in the past. He ran out of medications. He does not want to take med since it make him  too drowsy.  Pt thinks the rt side of his tongue has faint white appearance. No pain. No ulcers.   Pt bp is well controlled today. No cardiac or neurologic signs or symptoms.  Pt has high cholesterol history. Will get lipid panel today.      Review of Systems  Constitutional: Negative for fever, chills and fatigue.  HENT: Negative for congestion, drooling, ear pain, nosebleeds, sinus pressure and sore throat.   Respiratory: Negative for cough, chest tightness, shortness of breath and wheezing.   Cardiovascular: Negative for chest pain and palpitations.  Gastrointestinal: Negative for abdominal pain.  Musculoskeletal: Negative for back pain.  Skin: Negative for rash.  Neurological: Negative for dizziness, numbness and headaches.  Hematological: Negative for adenopathy. Does not bruise/bleed easily.  Psychiatric/Behavioral: Negative for behavioral problems, confusion and dysphoric mood. The patient is not nervous/anxious.     Past Medical History  Diagnosis Date  . Diabetes mellitus without complication (HCC)   . Hyperlipidemia   . Hypertension   . Depression 01/27/2015      Social History   Social History  . Marital Status: Single    Spouse Name: N/A  . Number of Children: N/A  . Years of Education: N/A   Occupational History  . Not on file.   Social History Main Topics  . Smoking status: Current Every Day Smoker  . Smokeless tobacco: Never Used  . Alcohol Use: No  . Drug Use: No  . Sexual Activity: Not on file   Other Topics Concern  . Not on file   Social History Narrative    Past Surgical History  Procedure Laterality Date  . Kidney stones  2009    No family history on file.  No Known Allergies  Current Outpatient Prescriptions on File Prior to Visit  Medication Sig Dispense Refill  . canagliflozin (INVOKANA) 100 MG TABS tablet Take 1 tablet (100 mg total) by mouth daily before breakfast. 30 tablet 3  . doxycycline (VIBRA-TABS) 100 MG tablet TK 1 T PO  D  0  . gabapentin (NEURONTIN) 300 MG capsule Take 1 capsule (300 mg total) by mouth 3 (three) times daily. 90 capsule 1  . glipiZIDE (GLUCOTROL) 10 MG tablet TAKE ONE TABLET DAILY BEFORE BREAKFAST 30 tablet 2  . hydrochlorothiazide (HYDRODIURIL) 25 MG tablet Take 25 mg by mouth daily. Take 1 tablet daily after breakfast.    . lisinopril (PRINIVIL,ZESTRIL) 20 MG tablet Take 1 tablet (20 mg total)  by mouth daily. 30 tablet 5  . lovastatin (MEVACOR) 40 MG tablet Take 1 tablet (40 mg total) by mouth at bedtime. 30 tablet 2  . metFORMIN (GLUCOPHAGE-XR) 500 MG 24 hr tablet TAKE 1 TABLET BY MOUTH TWO TIMES A DAY 60 tablet 0  . sertraline (ZOLOFT) 50 MG tablet TAKE ONE TABLET DAILY 30 tablet 2   No current facility-administered medications on file prior to visit.    BP 118/80 mmHg  Pulse 78  Temp(Src) 98.3 F (36.8 C) (Oral)  Ht 5\' 11"  (1.803 m)  Wt 211 lb 12.8 oz (96.072 kg)  BMI 29.55 kg/m2  SpO2 98%       Objective:   Physical Exam   General Mental Status- Alert. General Appearance- Not in acute distress.   Heent- poor dentition. Faint whitsh color to tongue. But no  thrush. No ulcers.   Skin General: Color- Normal Color. Moisture- Normal Moisture.  Neck Carotid Arteries- Normal color. Moisture- Normal Moisture. No carotid bruits. No JVD.  Chest and Lung Exam Auscultation: Breath Sounds:-Normal.  Cardiovascular Auscultation:Rythm- Regular. Murmurs & Other Heart Sounds:Auscultation of the heart reveals- No Murmurs.  Abdomen Inspection:-Inspeection Normal. Palpation/Percussion:Note:No mass. Palpation and Percussion of the abdomen reveal- Non Tender, Non Distended + BS, no rebound or guarding.   Neurologic Cranial Nerve exam:- CN III-XII intact(No nystagmus), symmetric smile. Strength:- 5/5 equal and symmetric strength both upper and lower extremities.     Assessment & Plan:  For htn continue lisinopril.   For your high cholesterol will get lipid panel today. Adjust med accordingly..  For diabetes get cmp and a1-c. Will increase metformin dose today. Continue glucotrol. If you get me pt assistance forms to fill out invokana will do so.  Your are better with your mood and want to stop sertraline. If with stopping mood worsens let me know.  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  Follow up in 3 months or as needed.  You declined health maintenance today do to concerns for cost.

## 2015-07-25 NOTE — Patient Instructions (Addendum)
For htn continue lisinopril.   For your high cholesterol will get lipid panel today. Adjust med accordingly  For diabetes get cmp and a1-c. Will increase metformin dose today. Continue glucotrol. If you get me pt assistance forms to fill out invokana will do so.  Your are better with your mood and want to stop sertraline. If with stopping mood worsens let me know.  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  Follow up in 3 months or as needed.  You declined health maintenance today do to concerns for cost.

## 2015-07-25 NOTE — Progress Notes (Signed)
Pre visit review using our clinic review tool, if applicable. No additional management support is needed unless otherwise documented below in the visit note. 

## 2015-07-26 ENCOUNTER — Telehealth: Payer: Self-pay | Admitting: Medical

## 2015-07-26 MED ORDER — EZETIMIBE 10 MG PO TABS: 10.0000 mg | ORAL_TABLET | Freq: Every day | ORAL | Status: DC

## 2015-07-26 NOTE — Telephone Encounter (Signed)
rx zetia sent in. Continue current statin.

## 2015-07-27 NOTE — Telephone Encounter (Signed)
Left message for pt and per DPR pt's friend Majeed of the changes in the medications. Pt's friend advised understanding.

## 2015-08-21 ENCOUNTER — Other Ambulatory Visit: Payer: Self-pay

## 2015-08-21 MED ORDER — HYDROCHLOROTHIAZIDE 25 MG PO TABS: 25.0000 mg | ORAL_TABLET | Freq: Every day | ORAL | Status: DC

## 2015-09-08 ENCOUNTER — Telehealth: Payer: Self-pay | Admitting: *Deleted

## 2015-09-08 NOTE — Telephone Encounter (Signed)
Received fax that is not legible, [facility] to [facility] requesting a clear copy to be mailed to our office. Awaiting form(s). JG//CMA

## 2015-09-16 ENCOUNTER — Other Ambulatory Visit: Payer: Self-pay | Admitting: Medical

## 2015-10-19 ENCOUNTER — Other Ambulatory Visit: Payer: Self-pay | Admitting: Medical

## 2015-10-19 ENCOUNTER — Telehealth: Payer: Self-pay | Admitting: Medical

## 2015-10-19 NOTE — Telephone Encounter (Signed)
This pt should be a 30 minute appointment. I believe we discussed this and he was designated as 30 minutes. Will you point this out to [place].

## 2015-10-20 NOTE — Telephone Encounter (Signed)
Changed in the patients chart on 10/20/15.

## 2015-10-24 ENCOUNTER — Ambulatory Visit (HOSPITAL_BASED_OUTPATIENT_CLINIC_OR_DEPARTMENT_OTHER)
Admission: RE | Admit: 2015-10-24 | Discharge: 2015-10-24 | Disposition: A | Discharge status: Home / Self Care | Payer: No Typology Code available for payment source | Source: Ambulatory Visit | Attending: Medical | Admitting: Medical

## 2015-10-24 ENCOUNTER — Encounter: Payer: Self-pay | Admitting: Medical

## 2015-10-24 ENCOUNTER — Telehealth: Payer: Self-pay | Admitting: Medical

## 2015-10-24 ENCOUNTER — Ambulatory Visit (INDEPENDENT_AMBULATORY_CARE_PROVIDER_SITE_OTHER): Payer: No Typology Code available for payment source | Admitting: Medical

## 2015-10-24 VITALS — BP 122/78 | HR 87 | Temp 98.2°F | Ht 70.5 in | Wt 220.4 lb

## 2015-10-24 DIAGNOSIS — I10 Essential (primary) hypertension: Secondary | ICD-10-CM

## 2015-10-24 DIAGNOSIS — E785 Hyperlipidemia, unspecified: Secondary | ICD-10-CM

## 2015-10-24 DIAGNOSIS — M25571 Pain in right ankle and joints of right foot: Secondary | ICD-10-CM

## 2015-10-24 DIAGNOSIS — IMO0002 Reserved for concepts with insufficient information to code with codable children: Secondary | ICD-10-CM

## 2015-10-24 DIAGNOSIS — F329 Major depressive disorder, single episode, unspecified: Secondary | ICD-10-CM

## 2015-10-24 DIAGNOSIS — E1165 Type 2 diabetes mellitus with hyperglycemia: Secondary | ICD-10-CM

## 2015-10-24 DIAGNOSIS — F32A Depression, unspecified: Secondary | ICD-10-CM

## 2015-10-24 DIAGNOSIS — E1169 Type 2 diabetes mellitus with other specified complication: Secondary | ICD-10-CM

## 2015-10-24 LAB — COMPREHENSIVE METABOLIC PANEL
ALK PHOS: 99 U/L (ref 39–117)
ALT: 20 U/L (ref 0–53)
AST: 14 U/L (ref 0–37)
Albumin: 4.4 g/dL (ref 3.5–5.2)
BILIRUBIN TOTAL: 0.3 mg/dL (ref 0.2–1.2)
BUN: 20 mg/dL (ref 6–23)
CALCIUM: 9.8 mg/dL (ref 8.4–10.5)
CO2: 26 meq/L (ref 19–32)
CREATININE: 0.85 mg/dL (ref 0.40–1.50)
Chloride: 100 mEq/L (ref 96–112)
GFR: 106.66 mL/min (ref >60.00)
GLUCOSE: 248 mg/dL — AB (ref 70–99)
Potassium: 4.5 mEq/L (ref 3.5–5.1)
Sodium: 134 mEq/L — ABNORMAL LOW (ref 135–145)
TOTAL PROTEIN: 7.9 g/dL (ref 6.0–8.3)

## 2015-10-24 LAB — LIPID PANEL
Cholesterol: 203 mg/dL — ABNORMAL HIGH (ref 0–200)
HDL: 32.1 mg/dL — AB (ref >39.00)
Total CHOL/HDL Ratio: 6
Triglycerides: 435 mg/dL — ABNORMAL HIGH (ref 0.0–149.0)

## 2015-10-24 LAB — LDL CHOLESTEROL, DIRECT: LDL DIRECT: 100 mg/dL

## 2015-10-24 LAB — HEMOGLOBIN A1C: Hgb A1c MFr Bld: 9.3 % — ABNORMAL HIGH (ref 4.6–6.5)

## 2015-10-24 IMAGING — DX DG FOOT COMPLETE 3+V*R*
3 series · 3 of 3 positions shown · non-contrast
Comparison: None in PACs

CLINICAL DATA: Pain in the ball of the right foot for a long time

EXAM:
RIGHT FOOT COMPLETE - 3+ VIEW

[foot ap]
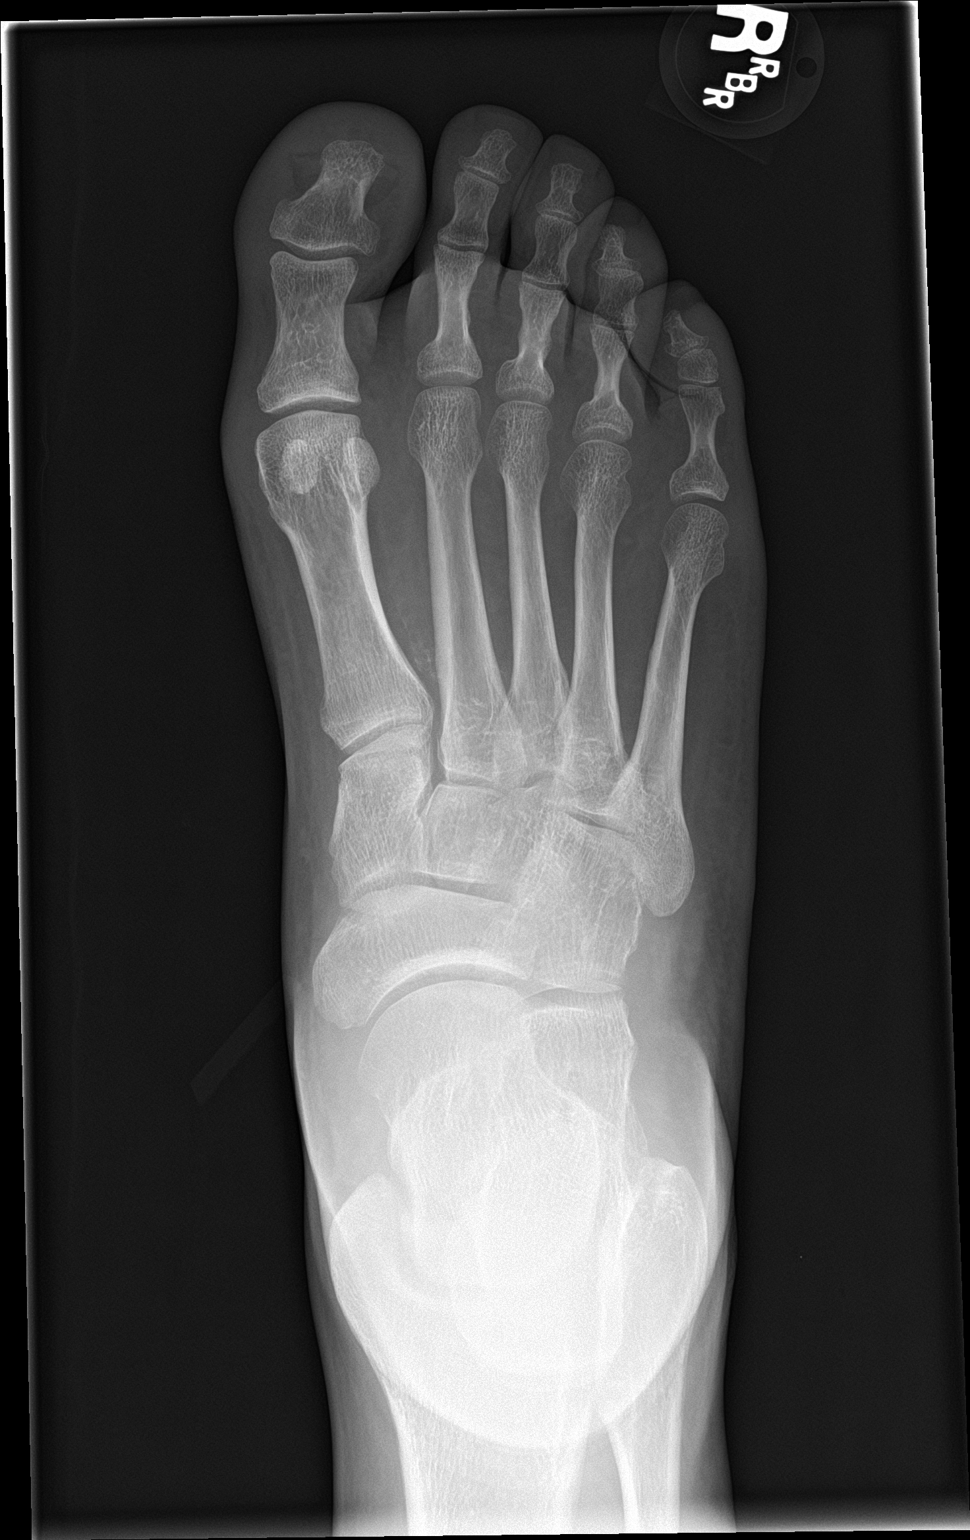

[foot obl]
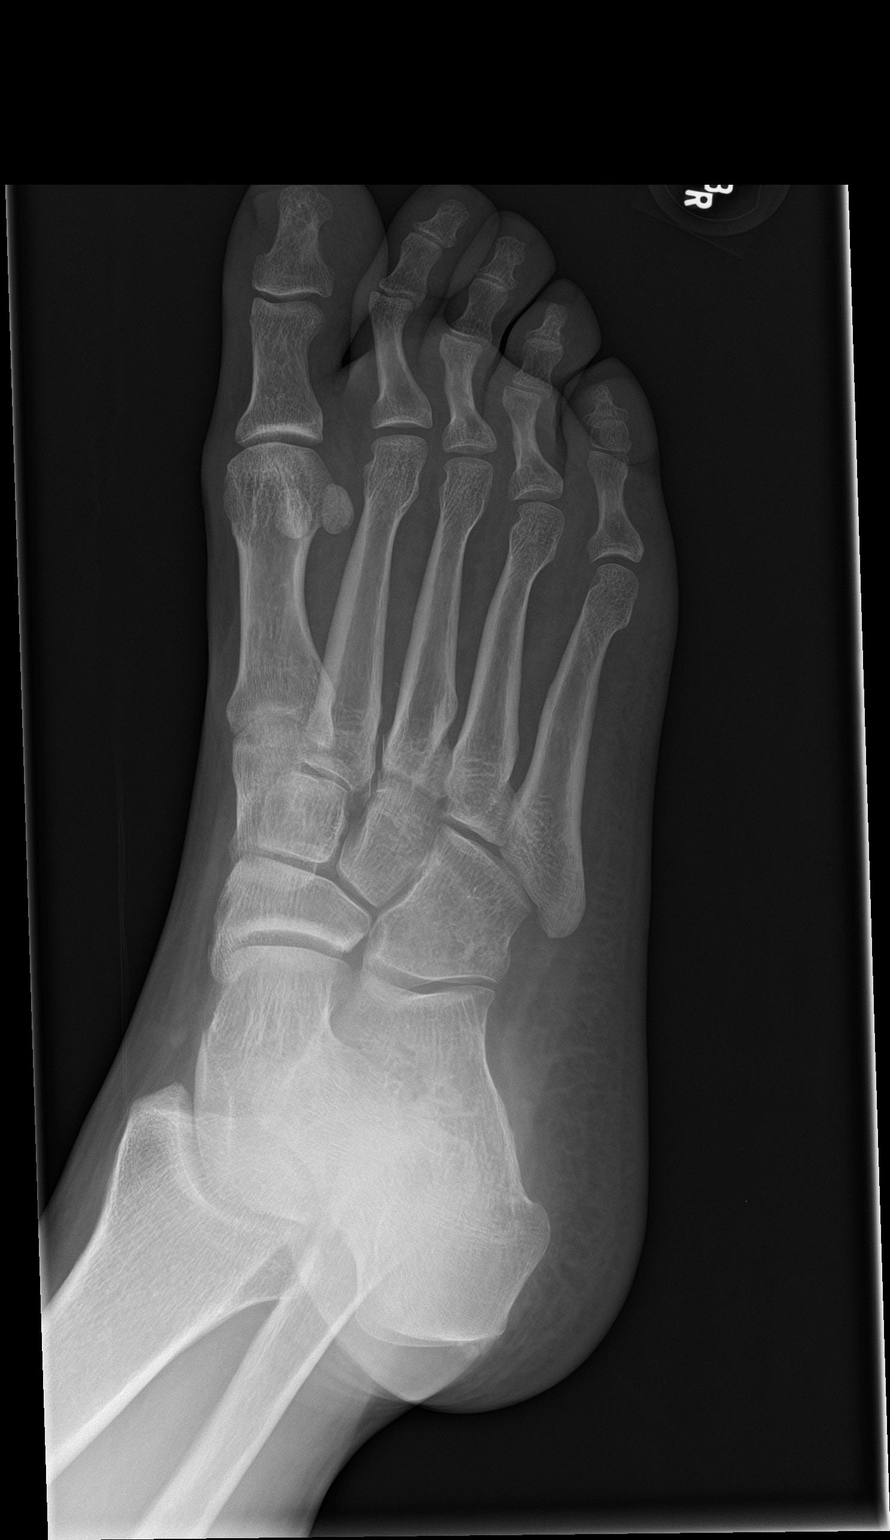

[foot lat]
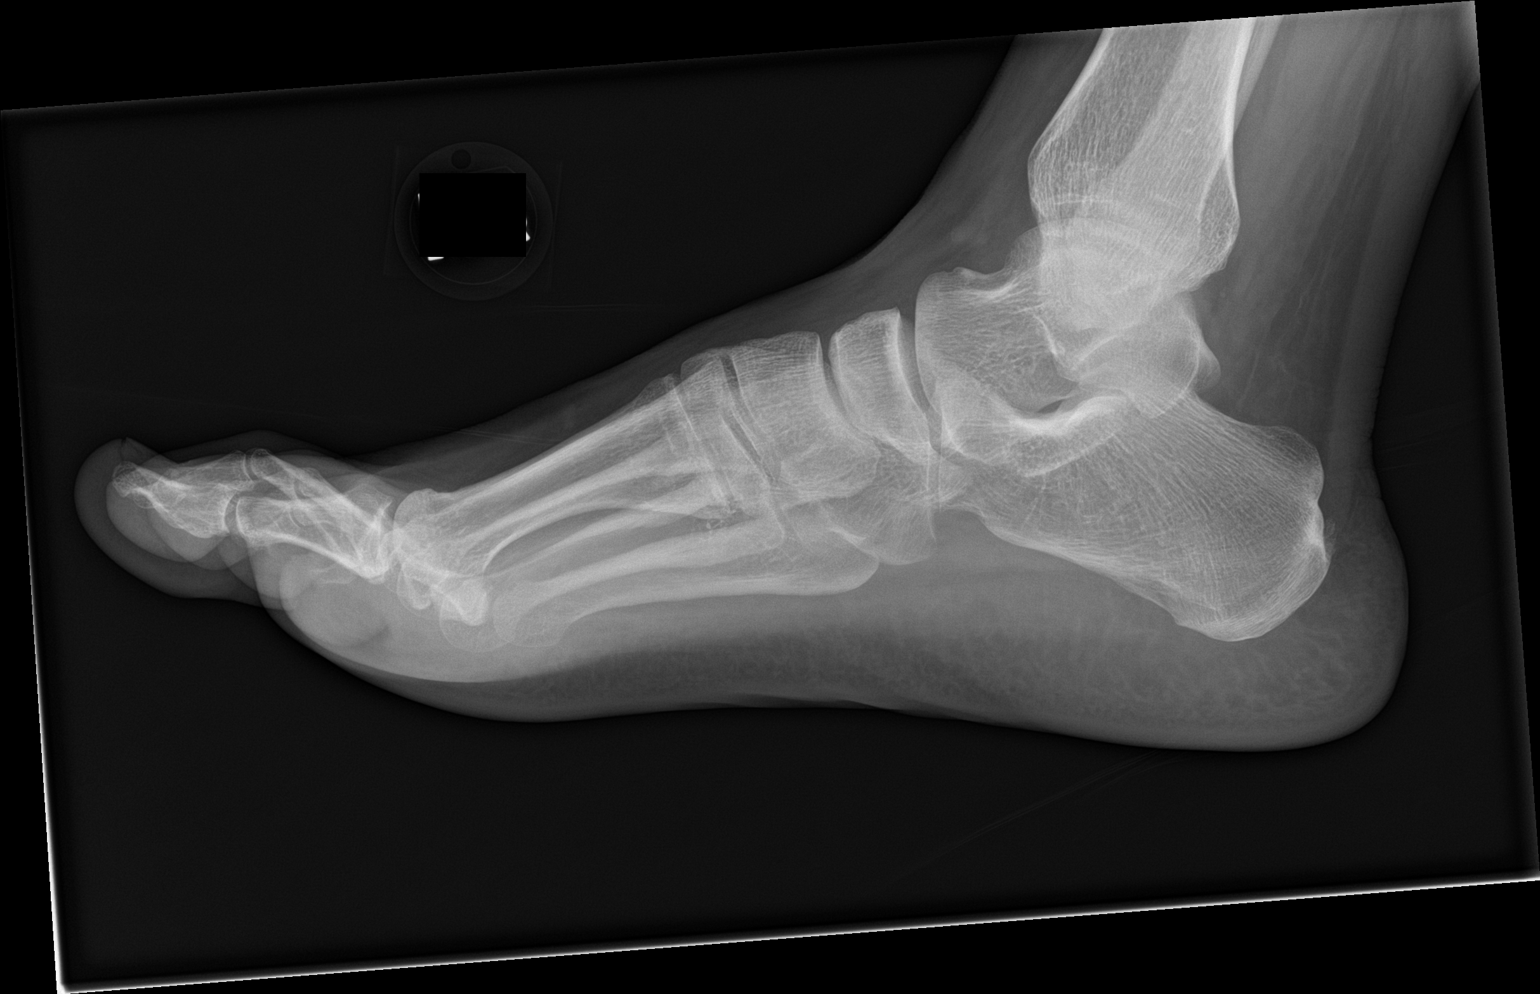

[3 of 3 positions shown; findings below may reference images not displayed]

FINDINGS: The bones of the right foot are adequately mineralized. There is no
lytic or blastic lesion. No acute or old fracture is observed. The
joint spaces are preserved. Specific attention to the MTP joint
regions reveals no abnormality. There is vascular calcification
between the first and second metatarsals.
IMPRESSION: There is no acute or chronic bony abnormality of the right foot.

## 2015-10-24 MED ORDER — LOVASTATIN 40 MG PO TABS: 40.0000 mg | ORAL_TABLET | Freq: Every day | ORAL | Status: DC

## 2015-10-24 NOTE — Patient Instructions (Addendum)
Bp well controlled today. Check bp intermittently at home 3 times a week.  For high cholesterol will recheck panel today. Then likely restart meds.  For diabetes continue current regimen.  For  Mood sertraline no longer needed. If mood worsens again notify us.  For foot pain will get rt foot xray.   For rare transient dizziness want you to check sugar and bp when you have any prolonged event.   Follow up in 3 months or as needed  Food Choices to Lower Your Triglycerides Triglycerides are a type of fat in your blood. High levels of triglycerides can increase the risk of heart disease and stroke. If your triglyceride levels are high, the foods you eat and your eating habits are very important. Choosing the right foods can help lower your triglycerides.  WHAT GENERAL GUIDELINES DO I NEED TO FOLLOW?  Lose weight if you are overweight.   Limit or avoid alcohol.   Fill one half of your plate with vegetables and green salads.   Limit fruit to two servings a day. Choose fruit instead of juice.   Make one fourth of your plate whole grains. Look for the word "whole" as the first word in the ingredient list.  Fill one fourth of your plate with lean protein foods.  Enjoy fatty fish (such as salmon, mackerel, sardines, and tuna) three times a week.   Choose healthy fats.   Limit foods high in starch and sugar.  Eat more home-cooked food and less restaurant, buffet, and fast food.  Limit fried foods.  Cook foods using methods other than frying.  Limit saturated fats.  Check ingredient lists to avoid foods with partially hydrogenated oils (trans fats) in them. WHAT FOODS CAN I EAT?  Grains Whole grains, such as whole wheat or whole grain breads, crackers, cereals, and pasta. Unsweetened oatmeal, bulgur, barley, quinoa, or brown rice. Corn or whole wheat flour tortillas.  Vegetables Fresh or frozen vegetables (raw, steamed, roasted, or grilled). Green salads. Fruits All  fresh, canned (in natural juice), or frozen fruits. Meat and Other Protein Products Ground beef (85% or leaner), grass-fed beef, or beef trimmed of fat. Skinless chicken or [place]. Ground chicken or [place]. Pork trimmed of fat. All fish and seafood. Eggs. Dried beans, peas, or lentils. Unsalted nuts or seeds. Unsalted canned or dry beans. Dairy Low-fat dairy products, such as skim or 1% milk, 2% or reduced-fat cheeses, low-fat ricotta or cottage cheese, or plain low-fat yogurt. Fats and Oils Tub margarines without trans fats. Light or reduced-fat mayonnaise and salad dressings. Avocado. Safflower, olive, or canola oils. Natural peanut or almond butter. The items listed above may not be a complete list of recommended foods or beverages. Contact your dietitian for more options. WHAT FOODS ARE NOT RECOMMENDED?  Grains White bread. White pasta. White rice. Cornbread. Bagels, pastries, and croissants. Crackers that contain trans fat. Vegetables White potatoes. Corn. Creamed or fried vegetables. Vegetables in a cheese sauce. Fruits Dried fruits. Canned fruit in light or heavy syrup. Fruit juice. Meat and Other Protein Products Fatty cuts of meat. Ribs, chicken wings, bacon, sausage, bologna, salami, chitterlings, fatback, hot dogs, bratwurst, and packaged luncheon meats. Dairy Whole or 2% milk, cream, half-and-half, and cream cheese. Whole-fat or sweetened yogurt. Full-fat cheeses. Nondairy creamers and whipped toppings. Processed cheese, cheese spreads, or cheese curds. Sweets and Desserts Corn syrup, sugars, honey, and molasses. Candy. Jam and jelly. Syrup. Sweetened cereals. Cookies, pies, cakes, donuts, muffins, and ice cream. Fats and Oils Butter, stick  margarine, lard, shortening, ghee, or bacon fat. Coconut, palm kernel, or palm oils. Beverages Alcohol. Sweetened drinks (such as sodas, lemonade, and fruit drinks or punches). The items listed above may not be a complete list of foods and  beverages to avoid. Contact your dietitian for more information.   This information is not intended to replace advice given to you by your health care provider. Make sure you discuss any questions you have with your health care provider.   Document Released: 01/18/2004 Document Revised: 04/22/2014 Document Reviewed: 02/03/2013 Elsevier Interactive Patient Education [employer]

## 2015-10-24 NOTE — Telephone Encounter (Signed)
Refilled his mevacor. His lipids were high. He was not taken his meds. So will repeat lipid panel fasting in 3 months. Call his friend so he knows med is at pharmacy.

## 2015-10-24 NOTE — Progress Notes (Signed)
Subjective:    Patient ID: Thomas CurdHaider Aylward, male    DOB: [birth year], 40 y.o.   MRN: [id number]  HPI   Pt in for follow up.   Pt in for diabetic check up. Pt is not exercising. Pt is checking his blood sugars. Pt recently states his bs is 97-110. When he checks his sugars in am has reading can occasionally go up to 140.Pt is on metformin and glipizide.   Pt bp is good today. Pt does have machine to check his bp. Discussed bp goal when he checks. No cardiac or neurologic signs or symptoms.  Pt last cholesterol check was high. In past he was on lovastatin. He did not have refill and so he stopped the med.  Pt mood is still good. Not feeling depressed. He ran out of sertraline. He did well without and states does not need.  He did mention occasional transient rt foot pain for years. Pain random and mild. He wears shoe with good insole that is soft.  He mentioned occasional random very transient dizziness for few seconds and only twice past 2 weeks. No associated signs or symptoms.       Review of Systems  Constitutional: Negative for fever, chills, diaphoresis, activity change and fatigue.  Respiratory: Negative for cough, chest tightness and shortness of breath.   Cardiovascular: Negative for chest pain, palpitations and leg swelling.  Gastrointestinal: Negative for nausea, vomiting and abdominal pain.  Musculoskeletal: Negative for neck pain and neck stiffness.       Rt foot pain.  Neurological: Negative for dizziness, seizures, syncope, weakness, light-headedness and headaches.  Psychiatric/Behavioral: Negative for behavioral problems, confusion and agitation. The patient is not nervous/anxious.     Past Medical History  Diagnosis Date  . Diabetes mellitus without complication (HCC)   . Hyperlipidemia   . Hypertension   . Depression 01/27/2015     Social History   Social History  . Marital Status: Single    Spouse Name: N/A  . Number of Children: N/A  . Years of  Education: N/A   Occupational History  . Not on file.   Social History Main Topics  . Smoking status: Current Every Day Smoker  . Smokeless tobacco: Never Used  . Alcohol Use: No  . Drug Use: No  . Sexual Activity: Not on file   Other Topics Concern  . Not on file   Social History Narrative    Past Surgical History  Procedure Laterality Date  . Kidney stones  2009    No family history on file.  No Known Allergies  Current Outpatient Prescriptions on File Prior to Visit  Medication Sig Dispense Refill  . doxycycline (VIBRA-TABS) 100 MG tablet TK 1 T PO  D  0  . ezetimibe (ZETIA) 10 MG tablet Take 1 tablet (10 mg total) by mouth daily. 30 tablet 3  . glipiZIDE (GLUCOTROL) 10 MG tablet Take 1 tablet (10 mg total) by mouth daily before breakfast. 30 tablet 2  . hydrochlorothiazide (HYDRODIURIL) 25 MG tablet Take 1 tablet (25 mg total) by mouth daily. Take 1 tablet daily after breakfast. 30 tablet 2  . lisinopril (PRINIVIL,ZESTRIL) 20 MG tablet TAKE ONE TABLET DAILY 30 tablet 4  . lovastatin (MEVACOR) 40 MG tablet Take 1 tablet (40 mg total) by mouth at bedtime. 30 tablet 2  . metFORMIN (GLUCOPHAGE-XR) 500 MG 24 hr tablet TAKE TWO TABLETS BY MOUTH TWICE A DAY 120 tablet 1  . sertraline (ZOLOFT) 50 MG tablet TAKE ONE  TABLET DAILY 30 tablet 2   No current facility-administered medications on file prior to visit.    BP 122/78 mmHg  Pulse 87  Temp(Src) 98.2 F (36.8 C) (Oral)  Ht 5' 10.5" (1.791 m)  Wt 220 lb 6.4 oz (99.973 kg)  BMI 31.17 kg/m2  SpO2 98%       Objective:   Physical Exam   General Mental Status- Alert. General Appearance- Not in acute distress.   Skin General: Color- Normal Color. Moisture- Normal Moisture.  Neck Carotid Arteries- Normal color. Moisture- Normal Moisture. No carotid bruits. No JVD.  Chest and Lung Exam Auscultation: Breath Sounds:-Normal.  Cardiovascular Auscultation:Rythm- Regular. Murmurs & Other Heart  Sounds:Auscultation of the heart reveals- No Murmurs.  Abdomen Inspection:-Inspeection Normal. Palpation/Percussion:Note:No mass. Palpation and Percussion of the abdomen reveal- Non Tender, Non Distended + BS, no rebound or guarding.    Neurologic Cranial Nerve exam:- CN III-XII intact(No nystagmus), symmetric smile. Drift Test:- No drift. Romberg Exam:- Negative.  Heal to Toe Gait exam:-Normal. Finger to Nose:- Normal/Intact Strength:- 5/5 equal and symmetric strength both upper and lower extremities.  Rt foot- faint pain distal metatarsal head region.(see foot exam)       Assessment & Plan:  Bp well controlled today. Check bp intermittently at home 3 times a week.  For high cholesterol will recheck panel today. Then likely restart meds.  For diabetes continue current regimen.  For  Mood sertraline no longer needed. If mood worsens again notify us.  For foot pain will get rt foot xray.   For rare transient dizziness want you to check sugar and bp when you have any prolonged event.   Follow up in 3 months or as needed  Shanta Dorvil, Ramon Dredge, [employer]

## 2015-12-18 ENCOUNTER — Other Ambulatory Visit: Payer: Self-pay | Admitting: Medical

## 2016-01-17 ENCOUNTER — Other Ambulatory Visit: Payer: Self-pay | Admitting: Medical

## 2016-01-24 ENCOUNTER — Ambulatory Visit (INDEPENDENT_AMBULATORY_CARE_PROVIDER_SITE_OTHER): Payer: No Typology Code available for payment source | Admitting: Medical

## 2016-01-24 ENCOUNTER — Encounter: Payer: Self-pay | Admitting: Medical

## 2016-01-24 VITALS — BP 130/80 | HR 101 | Temp 98.4°F | Ht 71.0 in | Wt 224.0 lb

## 2016-01-24 DIAGNOSIS — Z23 Encounter for immunization: Secondary | ICD-10-CM

## 2016-01-24 DIAGNOSIS — E1169 Type 2 diabetes mellitus with other specified complication: Secondary | ICD-10-CM

## 2016-01-24 DIAGNOSIS — I10 Essential (primary) hypertension: Secondary | ICD-10-CM

## 2016-01-24 DIAGNOSIS — F17200 Nicotine dependence, unspecified, uncomplicated: Secondary | ICD-10-CM

## 2016-01-24 DIAGNOSIS — IMO0002 Reserved for concepts with insufficient information to code with codable children: Secondary | ICD-10-CM

## 2016-01-24 DIAGNOSIS — E785 Hyperlipidemia, unspecified: Secondary | ICD-10-CM

## 2016-01-24 DIAGNOSIS — E1165 Type 2 diabetes mellitus with hyperglycemia: Secondary | ICD-10-CM

## 2016-01-24 LAB — LDL CHOLESTEROL, DIRECT: Direct LDL: 104 mg/dL

## 2016-01-24 LAB — COMPREHENSIVE METABOLIC PANEL
ALT: 28 U/L (ref 0–53)
AST: 19 U/L (ref 0–37)
Albumin: 4.2 g/dL (ref 3.5–5.2)
Alkaline Phosphatase: 86 U/L (ref 39–117)
BUN: 14 mg/dL (ref 6–23)
CHLORIDE: 98 meq/L (ref 96–112)
CO2: 27 meq/L (ref 19–32)
CREATININE: 0.93 mg/dL (ref 0.40–1.50)
Calcium: 9.7 mg/dL (ref 8.4–10.5)
GFR: 96.02 mL/min (ref >60.00)
GLUCOSE: 248 mg/dL — AB (ref 70–99)
Potassium: 3.9 mEq/L (ref 3.5–5.1)
SODIUM: 135 meq/L (ref 135–145)
Total Bilirubin: 0.3 mg/dL (ref 0.2–1.2)
Total Protein: 7.7 g/dL (ref 6.0–8.3)

## 2016-01-24 LAB — HEMOGLOBIN A1C: HEMOGLOBIN A1C: 10 % — AB (ref 4.6–6.5)

## 2016-01-24 LAB — LIPID PANEL
Cholesterol: 220 mg/dL — ABNORMAL HIGH (ref 0–200)
HDL: 30.2 mg/dL — ABNORMAL LOW (ref >39.00)
Total CHOL/HDL Ratio: 7
Triglycerides: 491 mg/dL — ABNORMAL HIGH (ref 0.0–149.0)

## 2016-01-24 MED ORDER — BUPROPION HCL ER (SR) 150 MG PO TB12: ORAL_TABLET | ORAL | 1 refills | Status: DC

## 2016-01-24 NOTE — Progress Notes (Signed)
Subjective:    Patient ID: Thomas [name], male    DOB: [birth year], 40 y.o.   MRN: [id number]  HPI  Pt in for follow up.  Pt had some surgeries on his eyes. Pt had laser surgery on his rt eye. Then next week will have left eye surgery. 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 Surgeon was Dr Grayling Congress. Pt will have second surgery on Jan 30, 2016. Still has some blurred vision.  Pt feels some fatigue in his legs. He states with mild activity. Pt friends he thinks he is deconditioned. Pt is not working. He stays at his house a lot. He is sedentary. No numbness in legs. But faint numbness at bottom of his feet. No back pain. No incontinence. No saddle anesthesia.  Pt a1-c past was 9.3. No hyperglycemic signs and symptoms. Pt states a lot of times his sugar 150-160. Pt is on metformin and glipizide.  Pt bp mild high initially. No cardiac or neurologic signs or symptoms.  Pt is smoker.Current 1 pack a day. Was 3 pack a day.    Review of Systems  Constitutional: Negative for chills, fatigue and fever.  Eyes:       See hpi  Respiratory: Negative for cough, chest tightness, shortness of breath and wheezing.   Cardiovascular: Negative for chest pain.  Gastrointestinal: Negative for abdominal pain.  Musculoskeletal:       See hpi.  Neurological: Negative for dizziness, tremors, seizures, syncope, speech difficulty, light-headedness, numbness and headaches.  Hematological: Negative for adenopathy.  Psychiatric/Behavioral: Negative for behavioral problems, confusion, sleep disturbance and suicidal ideas. The patient is not nervous/anxious.      Past Medical History:  Diagnosis Date  . Depression 01/27/2015  . Diabetes mellitus without complication (HCC)   . Hyperlipidemia   . Hypertension      Social History   Social History  . Marital status: Single    Spouse name: N/A  . Number of children: N/A  . Years of education: N/A   Occupational History  . Not on  file.   Social History Main Topics  . Smoking status: Current Every Day Smoker  . Smokeless tobacco: Never Used  . Alcohol use No  . Drug use: No  . Sexual activity: Not on file   Other Topics Concern  . Not on file   Social History Narrative  . No narrative on file    Past Surgical History:  Procedure Laterality Date  . kidney stones  2009    No family history on file.  No Known Allergies  Current Outpatient Prescriptions on File Prior to Visit  Medication Sig Dispense Refill  . glipiZIDE (GLUCOTROL) 10 MG tablet TAKE 1 TABLET BY MOUTH DAILY BEFORE BREAKFAST 30 tablet 1  . hydrochlorothiazide (HYDRODIURIL) 25 MG tablet TAKE 1 TABLET BY MOUTH DAILY AFTER BREAKFAST. 30 tablet 1  . lisinopril (PRINIVIL,ZESTRIL) 20 MG tablet TAKE ONE TABLET DAILY 30 tablet 4  . metFORMIN (GLUCOPHAGE-XR) 500 MG 24 hr tablet TAKE TWO TABLETS BY MOUTH TWICE A DAY 120 tablet 0   No current facility-administered medications on file prior to visit.     BP 130/80   Pulse (!) 101   Temp 98.4 F (36.9 C) (Oral)   Ht 5\' 11"  (1.803 m)   Wt 224 lb (101.6 kg)   SpO2 99%   BMI 31.24 kg/m      Objective:   Physical Exam  General Mental Status- Alert. General Appearance- Not in acute distress.  Skin General: Color- Normal Color. Moisture- Normal Moisture.  Neck Carotid Arteries- Normal color. Moisture- Normal Moisture. No carotid bruits. No JVD.  Chest and Lung Exam Auscultation: Breath Sounds:-Normal.  Cardiovascular Auscultation:Rythm- Regular. Murmurs & Other Heart Sounds:Auscultation of the heart reveals- No Murmurs.  Abdomen Inspection:-Inspeection Normal. Palpation/Percussion:Note:No mass. Palpation and Percussion of the abdomen reveal- Non Tender, Non Distended + BS, no rebound or guarding.   Neurologic Cranial Nerve exam:- CN III-XII intact(No nystagmus), symmetric smile. Strength:- 5/5 equal and symmetric strength both upper and lower extremities. Good strength of lower  ext.      Assessment & Plan:  Your bp is well controlled today. Continue with current med regimen.  Will repeat your lipid panel today.  For your diabetes will check you a1c and cmp today.  For smoking will rx wellbutrin. Print rx. [facility]  I want you to discuss to blurred with Eye MD next week.   I think periodic leg weakness is from deconditioning but if this is more profound then may refer to neurologist. Their are other conditions that may need to be considered  but I want him to first try being active daily before doing extensive work up. Physical exam today appeared normal.  Follow up in 3 months or as needed  Flu vaccine today.  [name], [name], PA-C

## 2016-01-24 NOTE — Progress Notes (Signed)
Pre visit review using our clinic tool,if applicable. No additional management support is needed unless otherwise documented below in the visit note.  

## 2016-01-24 NOTE — Patient Instructions (Addendum)
Your bp is well controlled today. Continue with current med regimen.  Will repeat your lipid panel today.  For your diabetes will check you a1c and cmp today.  For smoking will rx wellbutrin. Print rx. [facility]  I want you to discuss to blurred with Eye MD next week.   I think periodic leg weakness is from deconditioning but if this is more profound then may refer to neurologist. Their are other conditions that may need to be considered in future  but I want you  to first try being active daily before doing extensive work up. Physical exam today appeared normal.  Will give flu vaccine today.  Follow up in 3 months  or as needed

## 2016-01-27 ENCOUNTER — Telehealth: Payer: Self-pay | Admitting: Medical

## 2016-01-29 NOTE — Telephone Encounter (Signed)
Opened for review. 

## 2016-01-30 ENCOUNTER — Telehealth: Payer: Self-pay | Admitting: Medical

## 2016-01-30 MED ORDER — PRAVASTATIN SODIUM 20 MG PO TABS: 20.0000 mg | ORAL_TABLET | Freq: Every day | ORAL | 3 refills | Status: DC

## 2016-01-30 MED ORDER — LIRAGLUTIDE 18 MG/3ML ~~LOC~~ SOPN: 0.6000 mg | PEN_INJECTOR | Freq: Once | SUBCUTANEOUS | 5 refills | Status: DC

## 2016-01-30 MED ORDER — FENOFIBRATE 48 MG PO TABS: 48.0000 mg | ORAL_TABLET | Freq: Every day | ORAL | 3 refills | Status: DC

## 2016-01-30 NOTE — Telephone Encounter (Signed)
rx of pravastatin, fenofibrate and vicotza sent to his pharmacy. Will you call pt and verify that meds were not too expensive.  Pravastatin will reduce cholesterol. Fenofibrate will reduce  his triglycerides. If pt has body aches with with these cholesterol  meds then stop both and let us know.

## 2016-02-05 NOTE — Telephone Encounter (Signed)
Called patient regarding medication affordability and providers recomendations.. Unable to leave message for return call,voicemail not set up.

## 2016-02-05 NOTE — Telephone Encounter (Signed)
Called back  To check on patients affordability to pick up medications.   Per caregiver medications have not been picked up from pharmacy. States they may go today.

## 2016-02-15 ENCOUNTER — Other Ambulatory Visit: Payer: Self-pay | Admitting: Medical

## 2016-02-15 NOTE — Telephone Encounter (Signed)
Pt was last seen 01/24/16 and told to continue current medication regimen. Will refill metformin #120 tablets 5 refills  and lisinopril #30 tablets 3 refills. TL/CMA

## 2016-03-19 ENCOUNTER — Other Ambulatory Visit: Payer: Self-pay | Admitting: Medical

## 2016-04-26 ENCOUNTER — Ambulatory Visit: Payer: No Typology Code available for payment source | Admitting: Medical

## 2016-05-03 ENCOUNTER — Encounter: Payer: Self-pay | Admitting: Medical

## 2016-05-03 ENCOUNTER — Ambulatory Visit (INDEPENDENT_AMBULATORY_CARE_PROVIDER_SITE_OTHER): Payer: No Typology Code available for payment source | Admitting: Medical

## 2016-05-03 VITALS — BP 139/83 | HR 103 | Temp 98.1°F | Resp 16 | Ht 71.0 in | Wt 223.0 lb

## 2016-05-03 DIAGNOSIS — I10 Essential (primary) hypertension: Secondary | ICD-10-CM

## 2016-05-03 DIAGNOSIS — IMO0002 Reserved for concepts with insufficient information to code with codable children: Secondary | ICD-10-CM

## 2016-05-03 DIAGNOSIS — F329 Major depressive disorder, single episode, unspecified: Secondary | ICD-10-CM

## 2016-05-03 DIAGNOSIS — E1169 Type 2 diabetes mellitus with other specified complication: Secondary | ICD-10-CM

## 2016-05-03 DIAGNOSIS — F32A Depression, unspecified: Secondary | ICD-10-CM

## 2016-05-03 DIAGNOSIS — E1165 Type 2 diabetes mellitus with hyperglycemia: Secondary | ICD-10-CM

## 2016-05-03 DIAGNOSIS — F17200 Nicotine dependence, unspecified, uncomplicated: Secondary | ICD-10-CM

## 2016-05-03 DIAGNOSIS — E785 Hyperlipidemia, unspecified: Secondary | ICD-10-CM

## 2016-05-03 LAB — COMPLETE METABOLIC PANEL WITH GFR
ALBUMIN: 4.1 g/dL (ref 3.6–5.1)
ALK PHOS: 89 U/L (ref 40–115)
ALT: 20 U/L (ref 9–46)
AST: 13 U/L (ref 10–40)
BILIRUBIN TOTAL: 0.4 mg/dL (ref 0.2–1.2)
BUN: 14 mg/dL (ref 7–25)
CO2: 22 mmol/L (ref 20–31)
Calcium: 9.8 mg/dL (ref 8.6–10.3)
Chloride: 99 mmol/L (ref 98–110)
Creat: 0.82 mg/dL (ref 0.60–1.35)
GLUCOSE: 270 mg/dL — AB (ref 65–99)
POTASSIUM: 4.3 mmol/L (ref 3.5–5.3)
SODIUM: 133 mmol/L — AB (ref 135–146)
TOTAL PROTEIN: 7.4 g/dL (ref 6.1–8.1)

## 2016-05-03 LAB — HEMOGLOBIN A1C: HEMOGLOBIN A1C: 11.1 % — AB (ref 4.6–6.5)

## 2016-05-03 MED ORDER — PRAVASTATIN SODIUM 20 MG PO TABS: 20.0000 mg | ORAL_TABLET | Freq: Every day | ORAL | 3 refills | Status: DC

## 2016-05-03 MED ORDER — FENOFIBRATE 48 MG PO TABS: 48.0000 mg | ORAL_TABLET | Freq: Every day | ORAL | 3 refills | Status: DC

## 2016-05-03 MED ORDER — LIRAGLUTIDE 18 MG/3ML ~~LOC~~ SOPN: 0.6000 mg | PEN_INJECTOR | Freq: Once | SUBCUTANEOUS | 5 refills | Status: DC

## 2016-05-03 MED ORDER — BUPROPION HCL ER (SR) 150 MG PO TB12: ORAL_TABLET | ORAL | 1 refills | Status: DC

## 2016-05-03 NOTE — Progress Notes (Signed)
Subjective:    Patient ID: Thomas [name], male    DOB: August 07, 1975, 41 y.o.   MRN: [id number]  HPI  Pt in for follow up.  Pt has high cholesterol. I had written fenofribrate and zocor but he never got the medication filled. Pt has not been exercising.   Pt is smoker. I had written him for wellbutrin. He lost the rx.   He also reports some depression. Mild-moderate. No homicidal or suicidal ideations. Pt states about a month ago his previous depression returned.  Pt bp little high initially. No gross motor or sensory functions. No chest pain.  Pt has some insomnia as well. Trouble sleeping at night.      Review of Systems  Constitutional: Negative for chills, fatigue and fever.  Respiratory: Negative for cough, choking, shortness of breath and wheezing.   Cardiovascular: Negative for chest pain and palpitations.  Gastrointestinal: Negative for abdominal pain, constipation, diarrhea and nausea.  Musculoskeletal: Negative for back pain.  Skin: Negative for rash.  Neurological: Negative for dizziness and headaches.  Hematological: Negative for adenopathy. Does not bruise/bleed easily.  Psychiatric/Behavioral: Positive for dysphoric mood. Negative for behavioral problems, confusion, sleep disturbance and suicidal ideas. The patient is not nervous/anxious.     Past Medical History:  Diagnosis Date  . Depression 01/27/2015  . Diabetes mellitus without complication (HCC)   . Hyperlipidemia   . Hypertension      Social History   Social History  . Marital status: Single    Spouse name: N/A  . Number of children: N/A  . Years of education: N/A   Occupational History  . Not on file.   Social History Main Topics  . Smoking status: Current Every Day Smoker  . Smokeless tobacco: Never Used  . Alcohol use No  . Drug use: No  . Sexual activity: Not on file   Other Topics Concern  . Not on file   Social History Narrative  . No narrative on file    Past Surgical  History:  Procedure Laterality Date  . kidney stones  2009    No family history on file.  No Known Allergies  Current Outpatient Prescriptions on File Prior to Visit  Medication Sig Dispense Refill  . glipiZIDE (GLUCOTROL) 10 MG tablet TAKE ONE TABLET BY MOUTH DAILY BEFORE BREAKFAST 30 tablet 1  . hydrochlorothiazide (HYDRODIURIL) 25 MG tablet TAKE 1 TABLET BY MOUTH DAILY AFTER BREAKFAST 30 tablet 1  . lisinopril (PRINIVIL,ZESTRIL) 20 MG tablet TAKE 1 TABLET BY MOUTH DAILY 30 tablet 3  . metFORMIN (GLUCOPHAGE-XR) 500 MG 24 hr tablet TAKE TWO TABLETS BY MOUTH TWICE A DAY 120 tablet 5  . buPROPion (WELLBUTRIN SR) 150 MG 12 hr tablet 1 tab po q day for 3 days then 1 tab po bid (Patient not taking: Reported on 05/03/2016) 63 tablet 1  . fenofibrate (TRICOR) 48 MG tablet Take 1 tablet (48 mg total) by mouth daily. (Patient not taking: Reported on 05/03/2016) 30 tablet 3  . liraglutide 18 MG/3ML SOPN Inject 0.1 mLs (0.6 mg total) into the skin once. 3 mL 5  . pravastatin (PRAVACHOL) 20 MG tablet Take 1 tablet (20 mg total) by mouth daily. (Patient not taking: Reported on 05/03/2016) 30 tablet 3   No current facility-administered medications on file prior to visit.     BP (!) 131/93 (BP Location: Left Arm, Patient Position: Sitting, Cuff Size: Large) Comment: No BP medication today.  Pulse (!) 108   Temp 98.1 F (36.7 C) (  Oral)   Resp 16   Ht 5\' 11"  (1.803 m)   Wt 223 lb (101.2 kg)   SpO2 100%   BMI 31.10 kg/m       Objective:   Physical Exam   General Mental Status- Alert. General Appearance- Not in acute distress.   Skin General: Color- Normal Color. Moisture- Normal Moisture.  Neck Carotid Arteries- Normal color. Moisture- Normal Moisture. No carotid bruits. No JVD.  Chest and Lung Exam Auscultation: Breath Sounds:-Normal.  Cardiovascular Auscultation:Rythm- Regular. Murmurs & Other Heart Sounds:Auscultation of the heart reveals- No  Murmurs.  Abdomen Inspection:-Inspeection Normal. Palpation/Percussion:Note:No mass. Palpation and Percussion of the abdomen reveal- Non Tender, Non Distended + BS, no rebound or guarding.    Neurologic Cranial Nerve exam:- CN III-XII intact(No nystagmus), symmetric smile. Drift Test:- No drift. Romberg Exam:- Negative.  Heal to Toe Gait exam:-Normal. Finger to Nose:- Normal/Intact Strength:- 5/5 equal and symmetric strength both upper and lower extremities.     Assessment & Plan:  Your bp is slight high today when I checked but less that 140 on systolic(top number). Continue current bp medication.  Your diabetes has not been well controlled. Will prescribe new medication victoza. If this is covered under your insurance then stop glipizide. Continue metformn.  For you mood and smoking cessation rx wellbutrin.  If you need medication for sleep then can rx trazadone but you declined today.  For you high cholesterol diet and exercise. Rx fenofibrate and zocor.   Get labs today.  Follow up 3 months or as needed

## 2016-05-03 NOTE — Progress Notes (Signed)
Pre visit review using our clinic review tool, if applicable. No additional management support is needed unless otherwise documented below in the visit note/SLS  

## 2016-05-03 NOTE — Patient Instructions (Addendum)
Your bp is slight high today when I checked but less that 140 on systolic(top number). Continue current bp medication.  Your diabetes has not been well controlled. Will prescribe new medication victoza. If this is covered under your insurance then stop glipizide. Continue metformin.  For you mood and smoking cessation rx wellbutrin.  If you need medication for sleep then can rx trazadone but you declined today.  For you high cholesterol diet and exercise. Rx fenofibrate and zocor.   Get labs today.  Follow up 3 months or as needed   When he sees Retina Specialist I asked him to have note of visit sent to our office.

## 2016-05-18 ENCOUNTER — Other Ambulatory Visit: Payer: Self-pay | Admitting: Medical

## 2016-06-25 ENCOUNTER — Other Ambulatory Visit: Payer: Self-pay | Admitting: *Deleted

## 2016-06-25 MED ORDER — HYDROCHLOROTHIAZIDE 25 MG PO TABS: ORAL_TABLET | ORAL | 3 refills | Status: DC

## 2016-06-25 MED ORDER — GLIPIZIDE 10 MG PO TABS: ORAL_TABLET | ORAL | 3 refills | Status: DC

## 2016-06-25 MED ORDER — LISINOPRIL 20 MG PO TABS: 20.0000 mg | ORAL_TABLET | Freq: Every day | ORAL | 3 refills | Status: DC

## 2016-06-25 NOTE — Progress Notes (Signed)
Refill sent per [facility] refill protocol/SLS  

## 2016-08-01 ENCOUNTER — Ambulatory Visit (INDEPENDENT_AMBULATORY_CARE_PROVIDER_SITE_OTHER): Payer: No Typology Code available for payment source | Admitting: Medical

## 2016-08-01 ENCOUNTER — Encounter: Payer: Self-pay | Admitting: Medical

## 2016-08-01 VITALS — BP 118/79 | HR 102 | Temp 98.1°F | Resp 16 | Ht 73.0 in | Wt 216.8 lb

## 2016-08-01 DIAGNOSIS — IMO0002 Reserved for concepts with insufficient information to code with codable children: Secondary | ICD-10-CM

## 2016-08-01 DIAGNOSIS — E1165 Type 2 diabetes mellitus with hyperglycemia: Secondary | ICD-10-CM

## 2016-08-01 DIAGNOSIS — E785 Hyperlipidemia, unspecified: Secondary | ICD-10-CM

## 2016-08-01 DIAGNOSIS — I10 Essential (primary) hypertension: Secondary | ICD-10-CM

## 2016-08-01 DIAGNOSIS — F32A Depression, unspecified: Secondary | ICD-10-CM

## 2016-08-01 DIAGNOSIS — F329 Major depressive disorder, single episode, unspecified: Secondary | ICD-10-CM

## 2016-08-01 DIAGNOSIS — E1169 Type 2 diabetes mellitus with other specified complication: Secondary | ICD-10-CM

## 2016-08-01 MED ORDER — CANAGLIFLOZIN 300 MG PO TABS: 300.0000 mg | ORAL_TABLET | Freq: Every day | ORAL | 3 refills | Status: DC

## 2016-08-01 NOTE — Patient Instructions (Addendum)
For htn continue current meds. Bp well controlled.  Mood well controlled since last visit. No longer on medication for depression.  Will check lab today cmp, a1c and lipid panel. Adjust meds accordingly. I will go ahead and rx invokana 300 mg dose. Your diabetic meds are  too expensive here as various meds I rx'd you could not purchase.. So would get meds at pharmacy you trust as discussed. Overseas pharmacy your family uses in [place] might be safe option.  You declined ekg today. If you get sustained tachycardia sensaton or palpitatoin that area severe then ED evaluation. Offer ekg again on next visit.  Follow up in 3 months or as needed

## 2016-08-01 NOTE — Progress Notes (Signed)
Pre visit review using our clinic review tool, if applicable. No additional management support is needed unless otherwise documented below in the visit note. 

## 2016-08-01 NOTE — Progress Notes (Signed)
Subjective:    Patient ID: Thomas [name], male    DOB: January 08, 1976, 41 y.o.   MRN: [id number]  HPI   Pt in for follow up.  Pt is on glipizide and metformin. In past invokana cost $400. When he got free samples his sugars were better. I had written victoza recently  and cost  was [phone] so he did not take the medication.  Pt had tried to be seen by clinic that has pharmacy. Sometimes free meds given but long wait up to 6 months for them to see him.  Pt states he has access to meds in [place]. His family knows reputable pharmacist.  Last lipid panel was in 6 months ago. He is fasting  Occasional feels random transient fast heart rate. Very rare. Never chest pain or sob. He can't describe frequency. Sometimes at rest. Not noticing with activity. He decline ekg today.  Pt has appointment with retina specialist September 13, 2016.  Bp controlled today.     Review of Systems  Constitutional: Negative for chills, fatigue and fever.  HENT: Negative for congestion and ear pain.   Respiratory: Negative for cough, chest tightness, shortness of breath and wheezing.   Cardiovascular: Negative for chest pain and palpitations.       Random occasional  Tachycardia. See hpi.  Gastrointestinal: Negative for abdominal pain.  Endocrine: Negative for polydipsia, polyphagia and polyuria.  Genitourinary: Negative for frequency.  Musculoskeletal: Negative for back pain, joint swelling and neck pain.  Skin: Negative for rash.  Neurological: Negative for dizziness, speech difficulty, weakness, numbness and headaches.  Hematological: Negative for adenopathy. Does not bruise/bleed easily.  Psychiatric/Behavioral: Negative for behavioral problems, confusion, dysphoric mood and suicidal ideas. The patient is not nervous/anxious.     Past Medical History:  Diagnosis Date  . Depression 01/27/2015  . Diabetes mellitus without complication (HCC)   . Hyperlipidemia   . Hypertension      Social History    Social History  . Marital status: Single    Spouse name: N/A  . Number of children: N/A  . Years of education: N/A   Occupational History  . Not on file.   Social History Main Topics  . Smoking status: Current Every Day Smoker  . Smokeless tobacco: Never Used  . Alcohol use No  . Drug use: No  . Sexual activity: Not on file   Other Topics Concern  . Not on file   Social History Narrative  . No narrative on file    Past Surgical History:  Procedure Laterality Date  . kidney stones  2009    No family history on file.  No Known Allergies  Current Outpatient Prescriptions on File Prior to Visit  Medication Sig Dispense Refill  . fenofibrate (TRICOR) 48 MG tablet Take 1 tablet (48 mg total) by mouth daily. 30 tablet 3  . glipiZIDE (GLUCOTROL) 10 MG tablet TAKE ONE TABLET BY MOUTH DAILY BEFORE BREAKFAST 30 tablet 3  . hydrochlorothiazide (HYDRODIURIL) 25 MG tablet TAKE 1 TABLET BY MOUTH DAILY AFTER BREAKFAST 30 tablet 3  . lisinopril (PRINIVIL,ZESTRIL) 20 MG tablet Take 1 tablet (20 mg total) by mouth daily. 30 tablet 3  . metFORMIN (GLUCOPHAGE-XR) 500 MG 24 hr tablet TAKE TWO TABLETS BY MOUTH TWICE A DAY 120 tablet 5  . liraglutide 18 MG/3ML SOPN Inject 0.1 mLs (0.6 mg total) into the skin once. 3 mL 5  . pravastatin (PRAVACHOL) 20 MG tablet Take 1 tablet (20 mg total) by mouth daily. (Patient  not taking: Reported on 08/01/2016) 30 tablet 3   No current facility-administered medications on file prior to visit.     BP 118/79 (BP Location: Right Arm, Patient Position: Sitting, Cuff Size: Large)   Pulse (!) 102   Temp 98.1 F (36.7 C) (Oral)   Resp 16   Ht  (1.854 m)   Wt 216 lb 12.8 oz (98.3 kg)   SpO2 98%   BMI 28.60 kg/m       Objective:   Physical Exam   General Mental Status- Alert. General Appearance- Not in acute distress.   Skin General: Color- Normal Color. Moisture- Normal Moisture.  Neck Carotid Arteries- Normal color. Moisture- Normal  Moisture. No carotid bruits. No JVD.  Chest and Lung Exam Auscultation: Breath Sounds:-Normal.  Cardiovascular Auscultation:Rythm- Regular. Murmurs & Other Heart Sounds:Auscultation of the heart reveals- No Murmurs.  Abdomen Inspection:-Inspeection Normal. Palpation/Percussion:Note:No mass. Palpation and Percussion of the abdomen reveal- Non Tender, Non Distended + BS, no rebound or guarding.   Neurologic Cranial Nerve exam:- CN III-XII intact(No nystagmus), symmetric smile. Strength:- 5/5 equal and symmetric strength both upper and lower extremities.    Assessment & Plan:  [phone].  Thomas [name] [phone].  For htn continue current meds. Bp well controlled.  Mood well controlled since last visit. No longer on medication for depression.  Will check lab today cmp, a1c and lipid panel. Adjust meds accordingly. I will go ahead and rx invokana 300 mg dose. Your diabetic meds are  too expensive here as various meds I rx'd you could not purchase.. So would get meds at pharmacy you trust as discussed. Overseas pharmacy your family uses in [place] might be safe option.  You declined ekg today. If you get sustained tachycardia sensaton or palpitatoin that area severe then ED evaluation. Offer ekg again on next visit.  Follow up in 3 months or as needed  Thomas [name], Ramon Dredge, [employer]

## 2016-08-05 ENCOUNTER — Telehealth: Payer: Self-pay | Admitting: Medical

## 2016-08-05 ENCOUNTER — Other Ambulatory Visit (INDEPENDENT_AMBULATORY_CARE_PROVIDER_SITE_OTHER): Payer: No Typology Code available for payment source

## 2016-08-05 DIAGNOSIS — I10 Essential (primary) hypertension: Secondary | ICD-10-CM

## 2016-08-05 DIAGNOSIS — E1165 Type 2 diabetes mellitus with hyperglycemia: Secondary | ICD-10-CM

## 2016-08-05 DIAGNOSIS — E1169 Type 2 diabetes mellitus with other specified complication: Secondary | ICD-10-CM

## 2016-08-05 DIAGNOSIS — E785 Hyperlipidemia, unspecified: Secondary | ICD-10-CM

## 2016-08-05 DIAGNOSIS — IMO0002 Reserved for concepts with insufficient information to code with codable children: Secondary | ICD-10-CM

## 2016-08-05 LAB — COMPREHENSIVE METABOLIC PANEL
ALBUMIN: 4.1 g/dL (ref 3.5–5.2)
ALK PHOS: 78 U/L (ref 39–117)
ALT: 21 U/L (ref 0–53)
AST: 15 U/L (ref 0–37)
BUN: 11 mg/dL (ref 6–23)
CALCIUM: 9.8 mg/dL (ref 8.4–10.5)
CO2: 27 mEq/L (ref 19–32)
Chloride: 101 mEq/L (ref 96–112)
Creatinine, Ser: 0.79 mg/dL (ref 0.40–1.50)
GFR: 115.6 mL/min (ref >60.00)
Glucose, Bld: 178 mg/dL — ABNORMAL HIGH (ref 70–99)
POTASSIUM: 3.9 meq/L (ref 3.5–5.1)
Sodium: 136 mEq/L (ref 135–145)
TOTAL PROTEIN: 7.1 g/dL (ref 6.0–8.3)
Total Bilirubin: 0.6 mg/dL (ref 0.2–1.2)

## 2016-08-05 LAB — CBC WITH DIFFERENTIAL/PLATELET
Basophils Absolute: 0 10*3/uL (ref 0.0–0.1)
Basophils Relative: 0.5 % (ref 0.0–3.0)
EOS ABS: 0.2 10*3/uL (ref 0.0–0.7)
Eosinophils Relative: 1.9 % (ref 0.0–5.0)
HCT: 44.6 % (ref 39.0–52.0)
HEMOGLOBIN: 15.1 g/dL (ref 13.0–17.0)
Lymphocytes Relative: 35.9 % (ref 12.0–46.0)
Lymphs Abs: 3.2 10*3/uL (ref 0.7–4.0)
MCHC: 33.9 g/dL (ref 30.0–36.0)
MCV: 86.2 fl (ref 78.0–100.0)
MONO ABS: 0.8 10*3/uL (ref 0.1–1.0)
Monocytes Relative: 8.9 % (ref 3.0–12.0)
Neutro Abs: 4.7 10*3/uL (ref 1.4–7.7)
Neutrophils Relative %: 52.8 % (ref 43.0–77.0)
Platelets: 276 10*3/uL (ref 150.0–400.0)
RBC: 5.17 Mil/uL (ref 4.22–5.81)
RDW: 13.7 % (ref 11.5–15.5)
WBC: 8.9 10*3/uL (ref 4.0–10.5)

## 2016-08-05 LAB — LIPID PANEL
Cholesterol: 212 mg/dL — ABNORMAL HIGH (ref 0–200)
HDL: 32.1 mg/dL — AB (ref >39.00)
LDL Cholesterol: 140 mg/dL — ABNORMAL HIGH (ref 0–99)
NonHDL: 180.03
TRIGLYCERIDES: 200 mg/dL — AB (ref 0.0–149.0)
Total CHOL/HDL Ratio: 7
VLDL: 40 mg/dL (ref 0.0–40.0)

## 2016-08-05 LAB — HEMOGLOBIN A1C: HEMOGLOBIN A1C: 11.2 % — AB (ref 4.6–6.5)

## 2016-08-05 MED ORDER — METFORMIN HCL ER 500 MG PO TB24: 1000.0000 mg | ORAL_TABLET | Freq: Two times a day (BID) | ORAL | 5 refills | Status: DC

## 2016-08-05 NOTE — Telephone Encounter (Signed)
rx metformin sent to pt pharmacy 

## 2016-08-06 NOTE — Telephone Encounter (Signed)
Notified pt. Rx sent to pharmacy.  

## 2016-08-16 ENCOUNTER — Telehealth: Payer: Self-pay | Admitting: Medical

## 2016-08-16 MED ORDER — PRAVASTATIN SODIUM 20 MG PO TABS
20.0000 mg | ORAL_TABLET | Freq: Every day | ORAL | 3 refills | Status: DC
Start: 2016-08-16 — End: 2017-09-08

## 2016-08-16 NOTE — Telephone Encounter (Signed)
Tired to reach pt left a voice mail that rx was sent to pharmacy and if pt has any question give us a call back.

## 2016-08-16 NOTE — Telephone Encounter (Signed)
Caller name: Dwana CurdHaider Rieke Relationship to patient: self Can be reached: [phone] [facility] [address]  Reason for call: Pt went to p/u pravastatin that was discussed with CMA and pharmacy did not receive any order for it. Please send today as pt does not have any medication.

## 2016-08-19 ENCOUNTER — Telehealth: Payer: Self-pay | Admitting: Medical

## 2016-08-19 NOTE — Telephone Encounter (Signed)
Pt states is supposed to let provider know that invokana was received.

## 2016-10-02 LAB — HM DIABETES EYE EXAM

## 2016-11-01 ENCOUNTER — Encounter: Payer: Self-pay | Admitting: Medical

## 2016-11-01 ENCOUNTER — Ambulatory Visit (INDEPENDENT_AMBULATORY_CARE_PROVIDER_SITE_OTHER): Payer: No Typology Code available for payment source | Admitting: Medical

## 2016-11-01 VITALS — BP 138/82 | HR 98 | Temp 98.2°F | Resp 16 | Ht 70.0 in | Wt 218.6 lb

## 2016-11-01 DIAGNOSIS — I10 Essential (primary) hypertension: Secondary | ICD-10-CM

## 2016-11-01 DIAGNOSIS — E1169 Type 2 diabetes mellitus with other specified complication: Secondary | ICD-10-CM

## 2016-11-01 DIAGNOSIS — IMO0002 Reserved for concepts with insufficient information to code with codable children: Secondary | ICD-10-CM

## 2016-11-01 DIAGNOSIS — E785 Hyperlipidemia, unspecified: Secondary | ICD-10-CM

## 2016-11-01 DIAGNOSIS — E1165 Type 2 diabetes mellitus with hyperglycemia: Secondary | ICD-10-CM

## 2016-11-01 NOTE — Progress Notes (Signed)
Subjective:    Patient ID: Thomas [name], male    DOB: 1975/07/03, 41 y.o.   MRN: [id number]  HPI  Pt in for follow up.   [place] He did get the invokana. 300 mg ose and started.  In addition to glipizide and metformin. Pt states when he takes all of the meds he feels dizzy. One time his blood sugar was 50. He thinks invokana too strong. Pt ate chocalate and sugar came up quick.  Pt is fasting. Compliant on his statin.  Bp good today. No side effect from med. No cardiac signs or symptoms.     Review of Systems  Constitutional: Negative for chills, fatigue and fever.  HENT: Negative for congestion, dental problem, ear pain and postnasal drip.   Respiratory: Negative for cough, chest tightness, shortness of breath and wheezing.   Cardiovascular: Negative for chest pain and palpitations.  Gastrointestinal: Negative for abdominal pain.  Genitourinary: Negative for dysuria, flank pain, frequency and penile swelling.  Musculoskeletal: Negative for back pain and myalgias.  Skin: Negative for rash.  Neurological: Negative for dizziness and headaches.       Dizziness when sugar was low.  Hematological: Negative for adenopathy. Does not bruise/bleed easily.  Psychiatric/Behavioral: Negative for behavioral problems and confusion.   Past Medical History:  Diagnosis Date  . Depression 01/27/2015  . Diabetes mellitus without complication (HCC)   . Hyperlipidemia   . Hypertension      Social History   Social History  . Marital status: Single    Spouse name: N/A  . Number of children: N/A  . Years of education: N/A   Occupational History  . Not on file.   Social History Main Topics  . Smoking status: Current Every Day Smoker  . Smokeless tobacco: Never Used  . Alcohol use No  . Drug use: No  . Sexual activity: Not on file   Other Topics Concern  . Not on file   Social History Narrative  . No narrative on file    Past Surgical  History:  Procedure Laterality Date  . kidney stones  2009    No family history on file.  No Known Allergies  Current Outpatient Prescriptions on File Prior to Visit  Medication Sig Dispense Refill  . canagliflozin (INVOKANA) 300 MG TABS tablet Take 1 tablet (300 mg total) by mouth daily before breakfast. 90 tablet 3  . fenofibrate (TRICOR) 48 MG tablet Take 1 tablet (48 mg total) by mouth daily. 30 tablet 3  . glipiZIDE (GLUCOTROL) 10 MG tablet TAKE ONE TABLET BY MOUTH DAILY BEFORE BREAKFAST 30 tablet 3  . hydrochlorothiazide (HYDRODIURIL) 25 MG tablet TAKE 1 TABLET BY MOUTH DAILY AFTER BREAKFAST 30 tablet 3  . liraglutide 18 MG/3ML SOPN Inject 0.1 mLs (0.6 mg total) into the skin once. 3 mL 5  . lisinopril (PRINIVIL,ZESTRIL) 20 MG tablet Take 1 tablet (20 mg total) by mouth daily. 30 tablet 3  . metFORMIN (GLUCOPHAGE-XR) 500 MG 24 hr tablet Take 2 tablets (1,000 mg total) by mouth 2 (two) times daily. 120 tablet 5  . pravastatin (PRAVACHOL) 20 MG tablet Take 1 tablet (20 mg total) by mouth daily. 30 tablet 3   No current facility-administered medications on file prior to visit.     BP 138/82   Pulse 98   Temp 98.2 F (36.8 C) (Oral)   Resp 16   Ht 5\' 10"  (1.778 m)   Wt 218 lb 9.6 oz (  99.2 kg)   SpO2 99%   BMI 31.37 kg/m       Objective:   Physical Exam  General Mental Status- Alert. General Appearance- Not in acute distress.   Neck Carotid Arteries- Normal color. Moisture- Normal Moisture. No carotid bruits. No JVD.  Chest and Lung Exam Auscultation: Breath Sounds:-Normal.  Cardiovascular Auscultation:Rythm- Regular. Murmurs & Other Heart Sounds:Auscultation of the heart reveals- No Murmurs.    Neurologic Cranial Nerve exam:- CN III-XII intact(No nystagmus), symmetric smile. Strength:- 5/5 equal and symmetric strength both upper and lower extremities.     Assessment & Plan:  Bp controlled today. Continue current medication.  For diabetes and recent low  sugars I want you to stop glipizide, take half of invokana 300 mg dose, and continue metformin. Check sugars twice daily fasting and another post meal.   When in for labs on Thursday or Friday  please show me your weekly sugar readings so I can determine if need to titrate up on invokana.  For cholesterol get lipid panel and may adjust cholesterol med dose.  Follow up date to be determined after lab review.  Discussed with pt foods and beverages that would quickly bring sugar up in event low sugar. But with above changes doubt will have.  Nicklaus Alviar, [name], PA-C

## 2016-11-01 NOTE — Patient Instructions (Addendum)
Bp controlled today. Continue current medication.  For diabetes and recent low sugars I want you to stop glipizide, take half of invokana 300 mg dose, and continue metformin. Check sugars twice daily fasting and another post meal.   When in for labs on Thursday or Friday  please show me your weekly sugar readings so I can determine if need to titrate up on invokana.  For cholesterol get lipid panel and may adjust cholesterol med dose.  Follow up date to be determined after lab review.

## 2016-11-07 ENCOUNTER — Other Ambulatory Visit: Payer: Self-pay | Admitting: Medical

## 2016-11-07 ENCOUNTER — Encounter: Payer: Self-pay | Admitting: Medical

## 2016-11-07 ENCOUNTER — Ambulatory Visit (INDEPENDENT_AMBULATORY_CARE_PROVIDER_SITE_OTHER): Payer: No Typology Code available for payment source | Admitting: Medical

## 2016-11-07 ENCOUNTER — Other Ambulatory Visit (INDEPENDENT_AMBULATORY_CARE_PROVIDER_SITE_OTHER): Payer: No Typology Code available for payment source

## 2016-11-07 VITALS — BP 113/83 | HR 106 | Temp 97.7°F | Resp 16 | Ht 70.0 in | Wt 212.4 lb

## 2016-11-07 DIAGNOSIS — IMO0002 Reserved for concepts with insufficient information to code with codable children: Secondary | ICD-10-CM

## 2016-11-07 DIAGNOSIS — I10 Essential (primary) hypertension: Secondary | ICD-10-CM

## 2016-11-07 DIAGNOSIS — E1165 Type 2 diabetes mellitus with hyperglycemia: Secondary | ICD-10-CM

## 2016-11-07 DIAGNOSIS — E785 Hyperlipidemia, unspecified: Secondary | ICD-10-CM

## 2016-11-07 DIAGNOSIS — E1169 Type 2 diabetes mellitus with other specified complication: Secondary | ICD-10-CM

## 2016-11-07 LAB — COMPREHENSIVE METABOLIC PANEL
ALT: 22 U/L (ref 0–53)
AST: 14 U/L (ref 0–37)
Albumin: 4.4 g/dL (ref 3.5–5.2)
Alkaline Phosphatase: 83 U/L (ref 39–117)
BILIRUBIN TOTAL: 0.6 mg/dL (ref 0.2–1.2)
BUN: 21 mg/dL (ref 6–23)
CALCIUM: 9.7 mg/dL (ref 8.4–10.5)
CHLORIDE: 100 meq/L (ref 96–112)
CO2: 24 meq/L (ref 19–32)
CREATININE: 1.07 mg/dL (ref 0.40–1.50)
GFR: 81.34 mL/min (ref >60.00)
Glucose, Bld: 264 mg/dL — ABNORMAL HIGH (ref 70–99)
Potassium: 4 mEq/L (ref 3.5–5.1)
SODIUM: 133 meq/L — AB (ref 135–145)
Total Protein: 7.7 g/dL (ref 6.0–8.3)

## 2016-11-07 LAB — LIPID PANEL
CHOL/HDL RATIO: 6
CHOLESTEROL: 201 mg/dL — AB (ref 0–200)
HDL: 32.2 mg/dL — ABNORMAL LOW (ref >39.00)
Triglycerides: 411 mg/dL — ABNORMAL HIGH (ref 0.0–149.0)

## 2016-11-07 LAB — LDL CHOLESTEROL, DIRECT: LDL DIRECT: 98 mg/dL

## 2016-11-07 LAB — HEMOGLOBIN A1C: Hgb A1c MFr Bld: 11.3 % — ABNORMAL HIGH (ref 4.6–6.5)

## 2016-11-07 NOTE — Patient Instructions (Addendum)
Your sugars have been high since last visit. I had reduced your invokana dose based on you stating the medication was too strong. This appears not to be the case after review of recent sugar levels.  I want you to continue metformin the same and take full tab of invokana 300 mg a day. Check sugars fasting and one post meal as well daily. Write your readings down. Stop drinking your pepsi and no juices.  Check sugar also if you feel ill/not well. Discussed measures to take if low sugars.  Follow up in one week or as needed. May add back glipizide in near future or maybe just half tab  Follow up in one week or as needed

## 2016-11-07 NOTE — Progress Notes (Signed)
Subjective:    Patient ID: Thomas [name], male    DOB: 03-06-1976, 41 y.o.   MRN: [id number]  HPI  Pt in for follow up.  Previously to his last visit with me he stated he felt dizzy for one week while on all 3 of this diabetic medications. He checked sugar 14 times that week prior to seeing me. 2 times sugar were 70-80 after 5 hours fasting. The other times sugars were 150-160.    He though the invokana 300 mg q ay, gliizide 10 mg a day and and metformin  xr  500 mg 2  tabs a day was too much. So I decreased his regimen.  I had advised to take only one half tab of the invokana and continue metformin. To hold glipizide  After the recent change sugars  over past 8 days have been 250, 300, 281, 156, 263, 272, 341, and 284.    Pt also t admits not compliant on diet. 2 pepsi a day.  Sugar today was 295 fasting.  Review of Systems  Constitutional: Negative for chills, fatigue and fever.  HENT: Negative for congestion, sinus pain and sinus pressure.   Respiratory: Negative for cough, choking, chest tightness, shortness of breath and wheezing.   Cardiovascular: Negative for chest pain and palpitations.  Gastrointestinal: Negative for abdominal pain, constipation and diarrhea.  Endocrine: Positive for polyuria. Negative for polydipsia and polyphagia.  Musculoskeletal: Negative for back pain.  Skin: Negative for rash.  Neurological: Negative for dizziness and headaches.  Hematological: Negative for adenopathy. Does not bruise/bleed easily.  Psychiatric/Behavioral: Negative for behavioral problems and confusion. The patient is not nervous/anxious.     Past Medical History:  Diagnosis Date  . Depression 01/27/2015  . Diabetes mellitus without complication (HCC)   . Hyperlipidemia   . Hypertension      Social History   Social History  . Marital status: Single    Spouse name: N/A  . Number of children: N/A  . Years of education: N/A   Occupational History  . Not on file.    Social History Main Topics  . Smoking status: Current Every Day Smoker  . Smokeless tobacco: Never Used  . Alcohol use No  . Drug use: No  . Sexual activity: Not on file   Other Topics Concern  . Not on file   Social History Narrative  . No narrative on file    Past Surgical History:  Procedure Laterality Date  . kidney stones  2009    No family history on file.  No Known Allergies  Current Outpatient Prescriptions on File Prior to Visit  Medication Sig Dispense Refill  . canagliflozin (INVOKANA) 300 MG TABS tablet Take 1 tablet (300 mg total) by mouth daily before breakfast. 90 tablet 3  . fenofibrate (TRICOR) 48 MG tablet Take 1 tablet (48 mg total) by mouth daily. 30 tablet 3  . glipiZIDE (GLUCOTROL) 10 MG tablet TAKE ONE TABLET BY MOUTH DAILY BEFORE BREAKFAST 30 tablet 3  . hydrochlorothiazide (HYDRODIURIL) 25 MG tablet TAKE 1 TABLET BY MOUTH DAILY AFTER BREAKFAST 30 tablet 3  . liraglutide 18 MG/3ML SOPN Inject 0.1 mLs (0.6 mg total) into the skin once. 3 mL 5  . lisinopril (PRINIVIL,ZESTRIL) 20 MG tablet Take 1 tablet (20 mg total) by mouth daily. 30 tablet 3  . metFORMIN (GLUCOPHAGE-XR) 500 MG 24 hr tablet Take 2 tablets (1,000 mg total) by mouth 2 (two) times daily. 120 tablet 5  . pravastatin (PRAVACHOL) 20 MG  tablet Take 1 tablet (20 mg total) by mouth daily. 30 tablet 3   No current facility-administered medications on file prior to visit.     BP 113/83   Pulse (!) 106   Temp 97.7 F (36.5 C) (Oral)   Resp 16   Ht 5\' 10"  (1.778 m)   Wt 212 lb 6.4 oz (96.3 kg)   SpO2 97%   BMI 30.48 kg/m       Objective:   Physical Exam  General Mental Status- Alert. General Appearance- Not in acute distress.   Skin General: Color- Normal Color. Moisture- Normal Moisture.  Neck Carotid Arteries- Normal color. Moisture- Normal Moisture. No carotid bruits. No JVD.  Chest and Lung Exam Auscultation: Breath  Sounds:-Normal.  Cardiovascular Auscultation:Rythm- Regular. Murmurs & Other Heart Sounds:Auscultation of the heart reveals- No Murmurs.  Abdomen Inspection:-Inspeection Normal. Palpation/Percussion:Note:No mass. Palpation and Percussion of the abdomen reveal- Non Tender, Non Distended + BS, no rebound or guarding.   Neurologic Cranial Nerve exam:- CN III-XII intact(No nystagmus), symmetric smile. Strength:- 5/5 equal and symmetric strength both upper and lower extremities.        Assessment & Plan:  Your sugars have been high since last visit. I had reduced your invokana dose based on you stating the medication was too strong. This appears not to be the case after review of recent sugar levels.  I want you to continue metformin the same and take full tab of invokana 300 mg a day. Check sugars fasting and one post meal as well daily. Write your readings down. Stop drinking your pepsi and no juices.  Check sugar also if you feel ill/not well. Discussed measures to take if low sugars.  Follow up in one week or as needed. May add back glipizide in near future or maybe just half tab  Follow up in one week or as needed   Stephaie Dardis, [name], [place]

## 2016-11-14 ENCOUNTER — Ambulatory Visit (INDEPENDENT_AMBULATORY_CARE_PROVIDER_SITE_OTHER): Payer: No Typology Code available for payment source | Admitting: Medical

## 2016-11-14 VITALS — BP 117/75 | HR 105 | Temp 98.1°F | Resp 16 | Ht 71.0 in | Wt 216.6 lb

## 2016-11-14 DIAGNOSIS — R739 Hyperglycemia, unspecified: Secondary | ICD-10-CM

## 2016-11-14 DIAGNOSIS — E119 Type 2 diabetes mellitus without complications: Secondary | ICD-10-CM

## 2016-11-14 LAB — COMPLETE METABOLIC PANEL WITH GFR
ALBUMIN: 4.3 g/dL (ref 3.6–5.1)
ALT: 20 U/L (ref 9–46)
AST: 13 U/L (ref 10–40)
Alkaline Phosphatase: 91 U/L (ref 40–115)
BILIRUBIN TOTAL: 0.4 mg/dL (ref 0.2–1.2)
BUN: 19 mg/dL (ref 7–25)
CALCIUM: 10.1 mg/dL (ref 8.6–10.3)
CO2: 23 mmol/L (ref 20–31)
CREATININE: 0.98 mg/dL (ref 0.60–1.35)
Chloride: 101 mmol/L (ref 98–110)
GFR, Est African American: >89 mL/min (ref >=60)
GLUCOSE: 131 mg/dL — AB (ref 65–99)
POTASSIUM: 4.4 mmol/L (ref 3.5–5.3)
Sodium: 136 mmol/L (ref 135–146)
Total Protein: 7.2 g/dL (ref 6.1–8.1)

## 2016-11-14 LAB — GLUCOSE, POCT (MANUAL RESULT ENTRY)

## 2016-11-14 MED ORDER — PIOGLITAZONE HCL 30 MG PO TABS: 30.0000 mg | ORAL_TABLET | Freq: Every day | ORAL | 0 refills | Status: DC

## 2016-11-14 MED ORDER — CANAGLIFLOZIN 300 MG PO TABS: 300.0000 mg | ORAL_TABLET | Freq: Every day | ORAL | 0 refills | Status: DC

## 2016-11-14 MED FILL — PIOGLITAZONE HCL 30 MG TAB: 30 | 30 days supply | Qty: 30 | Fill #0

## 2016-11-14 NOTE — Patient Instructions (Signed)
For your diabetes continue metformin at current dose. Due to occasional low sugar decrease glipizide to 5 mg a day. Will rx actos 30 mg tab a day. Get at our pharmacy.  Keep checking bs daily. If you get still occasoinal low sugar will advise stopping glipizide all together.   Follow up in 3 weeks or as needed. If sugars not decreasing over next 10 days come in sooner. Stop invokana

## 2016-11-14 NOTE — Progress Notes (Signed)
Subjective:    Patient ID: Thomas [name], male    DOB: 01/25/1976, 41 y.o.   MRN: [id number]  HPI  Pt in for follow up. I had advised using metformin  xr 500 mg and 300 mg invokana. He only used it for 3 days. Sugar readings were 230, 180, and 313. He states invokana made him use bathroom/urinate too much.   Then he decided to go back to gliizide 10 mg  and metformin xr 500 2 a day. With these over past 5-6 days sugars 230, 197, 249, 313, 148 , 192 and 180. He states sugar was 60 around 2 in afternoon. He did eat lunch.   Pt a1c was 11.3 7 days ago.  Pt sugar in office was 193.     Review of Systems  Constitutional: Negative for chills, fatigue and fever.  Respiratory: Negative for cough, chest tightness, shortness of breath and wheezing.   Cardiovascular: Negative for chest pain and palpitations.  Gastrointestinal: Negative for abdominal pain, blood in stool, diarrhea and vomiting.  Genitourinary: Negative for decreased urine volume, dysuria, flank pain, frequency, hematuria, penile swelling and scrotal swelling.  Musculoskeletal: Negative for back pain, joint swelling and neck stiffness.  Skin: Negative for rash.  Neurological: Negative for dizziness, seizures, speech difficulty, weakness, light-headedness and headaches.  Hematological: Negative for adenopathy. Does not bruise/bleed easily.  Psychiatric/Behavioral: Negative for behavioral problems and confusion.    Past Medical History:  Diagnosis Date  . Depression 01/27/2015  . Diabetes mellitus without complication (HCC)   . Hyperlipidemia   . Hypertension      Social History   Social History  . Marital status: Single    Spouse name: N/A  . Number of children: N/A  . Years of education: N/A   Occupational History  . Not on file.   Social History Main Topics  . Smoking status: Current Every Day Smoker  . Smokeless tobacco: Never Used  . Alcohol use No  . Drug use: No  . Sexual activity: Not on file    Other Topics Concern  . Not on file   Social History Narrative  . No narrative on file    Past Surgical History:  Procedure Laterality Date  . kidney stones  2009    No family history on file.  No Known Allergies  Current Outpatient Prescriptions on File Prior to Visit  Medication Sig Dispense Refill  . fenofibrate (TRICOR) 48 MG tablet Take 1 tablet (48 mg total) by mouth daily. 30 tablet 3  . glipiZIDE (GLUCOTROL) 10 MG tablet TAKE ONE TABLET BY MOUTH DAILY BEFORE BREAKFAST 30 tablet 2  . hydrochlorothiazide (HYDRODIURIL) 25 MG tablet TAKE 1 TABLET BY MOUTH DAILY AFTER BREAKFAST 30 tablet 2  . lisinopril (PRINIVIL,ZESTRIL) 20 MG tablet TAKE 1 TABLET BY MOUTH DAILY 30 tablet 2  . metFORMIN (GLUCOPHAGE-XR) 500 MG 24 hr tablet Take 2 tablets (1,000 mg total) by mouth 2 (two) times daily. 120 tablet 5  . pravastatin (PRAVACHOL) 20 MG tablet Take 1 tablet (20 mg total) by mouth daily. 30 tablet 3   No current facility-administered medications on file prior to visit.     BP 117/75   Pulse (!) 105   Temp 98.1 F (36.7 C) (Oral)   Resp 16   Ht 5\' 11"  (1.803 m)   Wt 216 lb 9.6 oz (98.2 kg)   SpO2 100%   BMI 30.21 kg/m       Objective:   Physical Exam  General Mental Status-  Alert. General Appearance- Not in acute distress.   Skin General: Color- Normal Color. Moisture- Normal Moisture.  Neck Carotid Arteries- Normal color. Moisture- Normal Moisture. No carotid bruits. No JVD.  Chest and Lung Exam Auscultation: Breath Sounds:-Normal.  Cardiovascular Auscultation:Rythm- Regular. Murmurs & Other Heart Sounds:Auscultation of the heart reveals- No Murmurs.  Abdomen Inspection:-Inspeection Normal. Palpation/Percussion:Note:No mass. Palpation and Percussion of the abdomen reveal- Non Tender, Non Distended + BS, no rebound or guarding.   Neurologic Cranial Nerve exam:- CN III-XII intact(No nystagmus), symmetric smile. Strength:- 5/5 equal and symmetric  strength both upper and lower extremities.  Lower ext- no pedal edema.     Assessment & Plan:  For your diabetes continue metformin at current dose. Due to occasional low sugar decrease glipizide to 5 mg a day. Will rx actos 30 mg tab a day. Get at our pharmacy.  Keep checking bs daily. If you get still occasoinal low sugar will advise stopping glipizide all together.   Follow up in 3 weeks or as needed. If sugars not decreasing over next 10 days come in sooner. Stop invokana  Rx advisement given regarding actos. Signs and symptoms to watch for. Pt expressed understanding. (friend who speaks english served as Engineer, technical salesinterpretor)  Biochemist, clinicalaguier, BradburyEdward, VF CorporationPA-C

## 2016-11-26 ENCOUNTER — Telehealth: Payer: Self-pay | Admitting: Medical

## 2016-11-26 MED ORDER — PIOGLITAZONE HCL 15 MG PO TABS: 15.0000 mg | ORAL_TABLET | Freq: Every day | ORAL | 0 refills | Status: DC

## 2016-11-26 NOTE — Telephone Encounter (Signed)
Sent in additional 15 mg of actos.

## 2016-11-27 ENCOUNTER — Telehealth: Payer: Self-pay | Admitting: Medical

## 2016-11-27 NOTE — Telephone Encounter (Signed)
Pt returned call to get his lab results, please call pt at [phone]. Please advise.

## 2016-12-04 NOTE — Telephone Encounter (Signed)
Left pt a message to call back mailed letter.

## 2016-12-05 ENCOUNTER — Encounter: Payer: Self-pay | Admitting: Medical

## 2016-12-05 ENCOUNTER — Ambulatory Visit (INDEPENDENT_AMBULATORY_CARE_PROVIDER_SITE_OTHER): Payer: No Typology Code available for payment source | Admitting: Medical

## 2016-12-05 VITALS — BP 129/78 | HR 104 | Temp 98.0°F | Resp 16 | Ht 71.0 in | Wt 222.6 lb

## 2016-12-05 DIAGNOSIS — E119 Type 2 diabetes mellitus without complications: Secondary | ICD-10-CM

## 2016-12-05 NOTE — Progress Notes (Signed)
Subjective:    Patient ID: Thomas [name], male    DOB: [birth year], 41 y.o.   MRN: [id number]  HPI  Pt in for a follow up.   Pt states that when he eats and take medicine his sugars often go over 300. I had plans to increase his actos to 45 mg but he never got the message(so he was actually only taking 30 mg a day.). With Actos he reports  side effects are heart burn and constipation. Pt has been on metformin xr  500 2 tab a day. Also he takes 1/2 tab po glipizide. Regarding the glipizide as he did report some past low sugar events when he took the full dose.  Historically all medications that I wrote him were not well covered. Too expensive. The only thing that he could consistently fill without complaining of being too expensive were metformin and glipizide.  Most recently he mentioned that he had a friend from [place] who could get medications for him at much less priced and locally. So this was tried with Actos however he reports to me side effects.  Pt sugars are most of the time in mid 200's. Some high 100's and some over 300.   Review of Systems  Constitutional: Negative for appetite change, chills, diaphoresis, fatigue and fever.  Respiratory: Negative for cough, chest tightness, shortness of breath and wheezing.   Cardiovascular: Negative for chest pain and palpitations.  Musculoskeletal: Negative for back pain.  Skin: Negative for rash.  Neurological: Negative for dizziness and headaches.  Hematological: Negative for adenopathy. Does not bruise/bleed easily.  Psychiatric/Behavioral: Negative for behavioral problems, confusion, decreased concentration, sleep disturbance and suicidal ideas. The patient is not nervous/anxious and is not hyperactive.      Past Medical History:  Diagnosis Date  . Depression 01/27/2015  . Diabetes mellitus without complication (HCC)   . Hyperlipidemia   . Hypertension      Social History   Social History  . Marital status: Single   Spouse name: N/A  . Number of children: N/A  . Years of education: N/A   Occupational History  . Not on file.   Social History Main Topics  . Smoking status: Current Every Day Smoker  . Smokeless tobacco: Never Used  . Alcohol use No  . Drug use: No  . Sexual activity: Not on file   Other Topics Concern  . Not on file   Social History Narrative  . No narrative on file    Past Surgical History:  Procedure Laterality Date  . kidney stones  2009    No family history on file.  No Known Allergies  Current Outpatient Prescriptions on File Prior to Visit  Medication Sig Dispense Refill  . fenofibrate (TRICOR) 48 MG tablet Take 1 tablet (48 mg total) by mouth daily. 30 tablet 3  . glipiZIDE (GLUCOTROL) 10 MG tablet TAKE ONE TABLET BY MOUTH DAILY BEFORE BREAKFAST 30 tablet 2  . hydrochlorothiazide (HYDRODIURIL) 25 MG tablet TAKE 1 TABLET BY MOUTH DAILY AFTER BREAKFAST 30 tablet 2  . lisinopril (PRINIVIL,ZESTRIL) 20 MG tablet TAKE 1 TABLET BY MOUTH DAILY 30 tablet 2  . metFORMIN (GLUCOPHAGE-XR) 500 MG 24 hr tablet Take 2 tablets (1,000 mg total) by mouth 2 (two) times daily. 120 tablet 5  . pioglitazone (ACTOS) 15 MG tablet Take 1 tablet (15 mg total) by mouth daily. 30 tablet 0  . pioglitazone (ACTOS) 30 MG tablet Take 1 tablet (30 mg total) by mouth daily. 30 tablet 0  .  pravastatin (PRAVACHOL) 20 MG tablet Take 1 tablet (20 mg total) by mouth daily. 30 tablet 3   No current facility-administered medications on file prior to visit.     BP 129/78   Pulse (!) 104   Temp 98 F (36.7 C) (Oral)   Resp 16   Ht 5\' 11"  (1.803 m)   Wt 222 lb 9.6 oz (101 kg)   SpO2 99%   BMI 31.05 kg/m       Objective:   Physical Exam   General Mental Status- Alert. General Appearance- Not in acute distress.   Skin General: Color- Normal Color. Moisture- Normal Moisture.  Neck Carotid Arteries- Normal color. Moisture- Normal Moisture. No carotid bruits. No JVD.  Chest and Lung  Exam Auscultation: Breath Sounds:-Normal.  Cardiovascular Auscultation:Rythm- Regular. Murmurs & Other Heart Sounds:Auscultation of the heart reveals- No Murmurs.  Abdomen Inspection:-Inspeection Normal. Palpation/Percussion:Note:No mass. Palpation and Percussion of the abdomen reveal- Non Tender, Non Distended + BS, no rebound or guarding.    Neurologic Cranial Nerve exam:- CN III-XII intact(No nystagmus), symmetric smile. Strength:- 5/5 equal and symmetric strength both upper and lower extremities.     Assessment & Plan:  For your diabetes which has not been well controlled I want you to continue the metformin 2 tablets a day and I am giving you 2 sample pens of victoza to start. You'll take 0.6 mg once daily for the first week, 1.2 mg once daily for the second week, then advance to 1.8 mg daily.  Keep checking your sugars twice daily. Please discontinue the Actos as he reported many side effects. Can go ahead and stop the glipizide one half tablet as well.  I went ahead and wrote a referral to endocrinologist. [facility] card will determine if they have specialist they can send you to.  Follow-up with me in 2 weeks or as needed.  Note patient and his friend states that if the Victoza samples do work they think they might be able to get it from [place]. I will also write a prescription if the medication is working. Hopefully would be recently covered.  Vaeda Westall, Ramon Dredge, PA-C

## 2016-12-05 NOTE — Patient Instructions (Addendum)
For your diabetes which has not been well controlled I want you to continue the metformin 2 tablets a day and I am giving you 2 sample pens of victoza to start. You'll take 0.6 mg once daily for the first week, 1.2 mg once daily for the second week, then advance to 1.8 mg daily.  Keep checking your sugars twice daily. Please discontinue the Actos as he reported many side effects. Can go ahead and stop the glipizide one half tablet as well.  I went ahead and wrote a referral to endocrinologist. [facility] card will determine if they have specialist they can send you to.  Follow-up with me in 2 weeks or as needed.

## 2016-12-19 ENCOUNTER — Ambulatory Visit: Payer: No Typology Code available for payment source | Admitting: Medical

## 2016-12-20 ENCOUNTER — Emergency Department (HOSPITAL_BASED_OUTPATIENT_CLINIC_OR_DEPARTMENT_OTHER)
Admission: EM | Admit: 2016-12-20 | Discharge: 2016-12-20 | Disposition: A | Discharge status: Home / Self Care | Payer: No Typology Code available for payment source | Attending: Emergency Medicine | Admitting: Emergency Medicine

## 2016-12-20 ENCOUNTER — Encounter (HOSPITAL_BASED_OUTPATIENT_CLINIC_OR_DEPARTMENT_OTHER): Payer: Self-pay | Admitting: Emergency Medicine

## 2016-12-20 ENCOUNTER — Other Ambulatory Visit: Payer: Self-pay

## 2016-12-20 ENCOUNTER — Encounter: Payer: Self-pay | Admitting: Medical

## 2016-12-20 ENCOUNTER — Ambulatory Visit (INDEPENDENT_AMBULATORY_CARE_PROVIDER_SITE_OTHER): Payer: No Typology Code available for payment source | Admitting: Medical

## 2016-12-20 VITALS — BP 90/70 | HR 108 | Temp 98.0°F | Resp 16 | Ht 71.0 in | Wt 210.6 lb

## 2016-12-20 DIAGNOSIS — F17200 Nicotine dependence, unspecified, uncomplicated: Secondary | ICD-10-CM | POA: Insufficient documentation

## 2016-12-20 DIAGNOSIS — E118 Type 2 diabetes mellitus with unspecified complications: Secondary | ICD-10-CM

## 2016-12-20 DIAGNOSIS — I959 Hypotension, unspecified: Secondary | ICD-10-CM

## 2016-12-20 DIAGNOSIS — R55 Syncope and collapse: Secondary | ICD-10-CM

## 2016-12-20 DIAGNOSIS — H81399 Other peripheral vertigo, unspecified ear: Secondary | ICD-10-CM | POA: Insufficient documentation

## 2016-12-20 DIAGNOSIS — Z7984 Long term (current) use of oral hypoglycemic drugs: Secondary | ICD-10-CM | POA: Insufficient documentation

## 2016-12-20 DIAGNOSIS — E860 Dehydration: Secondary | ICD-10-CM

## 2016-12-20 DIAGNOSIS — R195 Other fecal abnormalities: Secondary | ICD-10-CM

## 2016-12-20 DIAGNOSIS — Z7902 Long term (current) use of antithrombotics/antiplatelets: Secondary | ICD-10-CM | POA: Insufficient documentation

## 2016-12-20 DIAGNOSIS — Z79899 Other long term (current) drug therapy: Secondary | ICD-10-CM | POA: Insufficient documentation

## 2016-12-20 DIAGNOSIS — I10 Essential (primary) hypertension: Secondary | ICD-10-CM | POA: Insufficient documentation

## 2016-12-20 DIAGNOSIS — I951 Orthostatic hypotension: Secondary | ICD-10-CM

## 2016-12-20 DIAGNOSIS — R112 Nausea with vomiting, unspecified: Secondary | ICD-10-CM

## 2016-12-20 DIAGNOSIS — R42 Dizziness and giddiness: Secondary | ICD-10-CM

## 2016-12-20 DIAGNOSIS — E1140 Type 2 diabetes mellitus with diabetic neuropathy, unspecified: Secondary | ICD-10-CM | POA: Insufficient documentation

## 2016-12-20 LAB — COMPREHENSIVE METABOLIC PANEL
ALT: 19 U/L (ref 17–63)
AST: 18 U/L (ref 15–41)
Albumin: 4.1 g/dL (ref 3.5–5.0)
Alkaline Phosphatase: 69 U/L (ref 38–126)
Anion gap: 10 (ref 5–15)
BILIRUBIN TOTAL: 0.6 mg/dL (ref 0.3–1.2)
BUN: 34 mg/dL — AB (ref 6–20)
CALCIUM: 9.8 mg/dL (ref 8.9–10.3)
CO2: 28 mmol/L (ref 22–32)
Chloride: 99 mmol/L — ABNORMAL LOW (ref 101–111)
Creatinine, Ser: 1.34 mg/dL — ABNORMAL HIGH (ref 0.61–1.24)
GFR calc Af Amer: >60 mL/min (ref >60)
Glucose, Bld: 194 mg/dL — ABNORMAL HIGH (ref 65–99)
POTASSIUM: 4.2 mmol/L (ref 3.5–5.1)
Sodium: 137 mmol/L (ref 135–145)
TOTAL PROTEIN: 7.5 g/dL (ref 6.5–8.1)

## 2016-12-20 LAB — URINALYSIS, ROUTINE W REFLEX MICROSCOPIC
GLUCOSE, UA: NEGATIVE mg/dL
HGB URINE DIPSTICK: NEGATIVE
KETONES UR: 15 mg/dL — AB
LEUKOCYTES UA: NEGATIVE
Nitrite: NEGATIVE
PH: 5.5 (ref 5.0–8.0)
PROTEIN: 100 mg/dL — AB
Specific Gravity, Urine: >1.03 — ABNORMAL HIGH (ref 1.005–1.030)

## 2016-12-20 LAB — CBC WITH DIFFERENTIAL/PLATELET
BASOS ABS: 0 10*3/uL (ref 0.0–0.1)
Basophils Relative: 0 %
Eosinophils Absolute: 0.1 10*3/uL (ref 0.0–0.7)
Eosinophils Relative: 1 %
HEMATOCRIT: 40.5 % (ref 39.0–52.0)
Hemoglobin: 13.8 g/dL (ref 13.0–17.0)
LYMPHS PCT: 31 %
Lymphs Abs: 3.2 10*3/uL (ref 0.7–4.0)
MCH: 29.4 pg (ref 26.0–34.0)
MCHC: 34.1 g/dL (ref 30.0–36.0)
MCV: 86.2 fL (ref 78.0–100.0)
MONO ABS: 0.9 10*3/uL (ref 0.1–1.0)
MONOS PCT: 8 %
Neutro Abs: 6.3 10*3/uL (ref 1.7–7.7)
Neutrophils Relative %: 60 %
Platelets: 271 10*3/uL (ref 150–400)
RBC: 4.7 MIL/uL (ref 4.22–5.81)
RDW: 12.9 % (ref 11.5–15.5)
WBC: 10.6 10*3/uL — ABNORMAL HIGH (ref 4.0–10.5)

## 2016-12-20 LAB — URINALYSIS, MICROSCOPIC (REFLEX): WBC, UA: NONE SEEN WBC/hpf (ref 0–5)

## 2016-12-20 LAB — TROPONIN I

## 2016-12-20 MED ORDER — MECLIZINE HCL 25 MG PO TABS
25.0000 mg | ORAL_TABLET | Freq: Once | ORAL | Status: AC
  Administered 2016-12-20: 25 mg via ORAL
  Filled 2016-12-20: qty 1

## 2016-12-20 MED ORDER — MECLIZINE HCL 25 MG PO TABS: 25.0000 mg | ORAL_TABLET | Freq: Three times a day (TID) | ORAL | 0 refills | Status: DC | PRN

## 2016-12-20 MED ORDER — GI COCKTAIL ~~LOC~~
30.0000 mL | Freq: Once | ORAL | Status: AC
  Administered 2016-12-20: 30 mL via ORAL
  Filled 2016-12-20: qty 30

## 2016-12-20 MED ORDER — SODIUM CHLORIDE 0.9 % IV BOLUS (SEPSIS)
2000.0000 mL | Freq: Once | INTRAVENOUS | Status: AC
  Administered 2016-12-20: 2000 mL via INTRAVENOUS

## 2016-12-20 MED ORDER — SODIUM CHLORIDE 0.9 % IV BOLUS (SEPSIS)
1000.0000 mL | Freq: Once | INTRAVENOUS | Status: AC
  Administered 2016-12-20: 1000 mL via INTRAVENOUS

## 2016-12-20 NOTE — Progress Notes (Signed)
Subjective:    Patient ID: Thomas [name], male    DOB: 1975-06-08, 41 y.o.   MRN: [id number]  HPI  Pt in for follow up.   Pt had 2 episodes of syncope. He just passed out. Very brief only for 3 seconds each time per pt. No chest pain or palpitations before passing out. Pt had sugar of 157 on prior event of syncope. Passed out 2 days ago. Bp check after that 1st  episode was 89/69  automatic. Then today 2nd time just walking and passed out(sugar today 197). Felt dizziness before and on regaining consciousness reports still feeling dizzy . No cardiac symptoms preceded brief LOC. Pt bp earlier today by machine was 95/70 and 105/70.  Pt had some loose stools since starting the victoza. But only 2 times a day. His usual bm pattern one time every 3 days.   Pt sugars have been in better range. He is now on victoza 1.2 mg a day. He is still on metformin. He brings in a long list of blood sugar readings since starting victoza and (180, 128, 220, 205, 185, 170, 177, 187, 215, 187, 157, 184, 139, 185, 141, 174, 155, 147, 162, and 179).  The above blood sugar readings are better than they have been in the past.   Pt has been vomiting each time he eats. Over past week-10 days. No abdomen/flank area pain. No ruq pain.   No trauma to head region on file. Though he does report some slight dizziness on review of systems.  Review of Systems  Constitutional: Negative for chills, fatigue and fever.  HENT: Negative for congestion and drooling.   Eyes: Negative for pain, discharge, redness and visual disturbance.  Respiratory: Negative for chest tightness, shortness of breath and wheezing.   Cardiovascular: Negative for chest pain and palpitations.  Gastrointestinal: Positive for diarrhea, nausea and vomiting. Negative for abdominal distention, abdominal pain, anal bleeding, blood in stool and constipation.  Genitourinary: Negative for difficulty urinating, dysuria and frequency.  Musculoskeletal:  Negative for back pain and joint swelling.  Neurological: Positive for dizziness and syncope. Negative for seizures, weakness, light-headedness, numbness and headaches.  Hematological: Negative for adenopathy. Does not bruise/bleed easily.  Psychiatric/Behavioral: Negative for behavioral problems and confusion.   Past Medical History:  Diagnosis Date  . Depression 01/27/2015  . Diabetes mellitus without complication (HCC)   . Hyperlipidemia   . Hypertension      Social History   Social History  . Marital status: Single    Spouse name: N/A  . Number of children: N/A  . Years of education: N/A   Occupational History  . Not on file.   Social History Main Topics  . Smoking status: Current Every Day Smoker  . Smokeless tobacco: Never Used  . Alcohol use No  . Drug use: No  . Sexual activity: Not on file   Other Topics Concern  . Not on file   Social History Narrative  . No narrative on file    Past Surgical History:  Procedure Laterality Date  . kidney stones  2009    No family history on file.  No Known Allergies  Current Outpatient Prescriptions on File Prior to Visit  Medication Sig Dispense Refill  . fenofibrate (TRICOR) 48 MG tablet Take 1 tablet (48 mg total) by mouth daily. 30 tablet 3  . glipiZIDE (GLUCOTROL) 10 MG tablet TAKE ONE TABLET BY MOUTH DAILY BEFORE BREAKFAST 30 tablet 2  . hydrochlorothiazide (HYDRODIURIL) 25 MG tablet TAKE  1 TABLET BY MOUTH DAILY AFTER BREAKFAST 30 tablet 2  . lisinopril (PRINIVIL,ZESTRIL) 20 MG tablet TAKE 1 TABLET BY MOUTH DAILY 30 tablet 2  . metFORMIN (GLUCOPHAGE-XR) 500 MG 24 hr tablet Take 2 tablets (1,000 mg total) by mouth 2 (two) times daily. 120 tablet 5  . pravastatin (PRAVACHOL) 20 MG tablet Take 1 tablet (20 mg total) by mouth daily. 30 tablet 3   No current facility-administered medications on file prior to visit.     BP 90/70   Pulse (!) 108   Temp 98 F (36.7 C) (Oral)   Resp 16   Ht 5\' 11"  (1.803 m)   Wt  210 lb 9.6 oz (95.5 kg)   SpO2 100%   BMI 29.37 kg/m       Objective:   Physical Exam  General Mental Status- Alert. General Appearance- Not in acute distress.   Skin General: Color- Normal Color. Moisture- Normal Moisture.  Neck Carotid Arteries- Normal color. Moisture- Normal Moisture. No carotid bruits. No JVD.  Chest and Lung Exam Auscultation: Breath Sounds:-Normal.  Cardiovascular Auscultation:Rythm- Regular. Murmurs & Other Heart Sounds:Auscultation of the heart reveals- No Murmurs.  Abdomen Inspection:-Inspeection Normal. Palpation/Percussion:Note:No mass. Palpation and Percussion of the abdomen reveal- Non Tender, Non Distended + BS, no rebound or guarding.   Neurologic Cranial Nerve exam:- CN III-XII intact(No nystagmus), symmetric smile. Strength:- 5/5 equal and symmetric strength both upper and lower extremities.      Assessment & Plan:  For your recent syncopal episodes, low blood pressure and dizziness, I want you to evaluated by the emergency department. The cause your symptoms might be dehydration but want to rule out any anemia. Also you might need imaging/CT of head. We will let emergency department decide that.  The vomiting could be viral related, gastroparesis or may be medication side effect?  Getting stat labs and workup will help us determine cause.  ekg normal rhythm. Very subtle/doubtful st elevation anterolateral. I think computer misread. Notified ED MD.  Follow up date with me to be determined after ED evaluation.        Cambell Rickenbach, [name], PA-C

## 2016-12-20 NOTE — ED Provider Notes (Signed)
[facility]-EMERGENCY DEPT Provider Note   CSN: [id number] Arrival date & time: 12/20/16  1423     History   Chief Complaint Chief Complaint  Patient presents with  . Near Syncope    HPI Thomas [name] is a 41 y.o. male.  HPI Patient presents with one week of dizziness which she describes as spinning sensation. Associated with nausea and vomiting. He's also had roughly one week of diarrhea. No gross blood in stool. Had several episodes of brief loss of consciousness with standing. No trauma. Was seen in his primary care doctor's office today and was hypertensive and tachycardic. Refer to the emergency department. Patient denies any chest pain or shortness of breath. No abdominal pain. No recent fever or chills. No focal weakness or numbness. Past Medical History:  Diagnosis Date  . Depression 01/27/2015  . Diabetes mellitus without complication (HCC)   . Hyperlipidemia   . Hypertension     Patient Active Problem List   Diagnosis Date Noted  . Diabetes mellitus type II, uncontrolled (HCC) 01/27/2015  . Diabetic neuropathy (HCC) 01/27/2015  . HTN (hypertension) 01/27/2015  . Depression 01/27/2015  . Hyperlipidemia 01/27/2015    Past Surgical History:  Procedure Laterality Date  . kidney stones  2009       Home Medications    Prior to Admission medications   Medication Sig Start Date End Date Taking? Authorizing Provider  fenofibrate (TRICOR) 48 MG tablet Take 1 tablet (48 mg total) by mouth daily. 05/03/16   Saguier, [name], PA-C  glipiZIDE (GLUCOTROL) 10 MG tablet TAKE ONE TABLET BY MOUTH DAILY BEFORE BREAKFAST 11/08/16   Saguier, [name], PA-C  hydrochlorothiazide (HYDRODIURIL) 25 MG tablet TAKE 1 TABLET BY MOUTH DAILY AFTER BREAKFAST 11/08/16   Saguier, [name], PA-C  lisinopril (PRINIVIL,ZESTRIL) 20 MG tablet TAKE 1 TABLET BY MOUTH DAILY 11/08/16   Saguier, [name], PA-C  meclizine (ANTIVERT) 25 MG tablet Take 1 tablet (25 mg total) by mouth 3 (three) times daily as needed  for dizziness. 12/20/16   Loren RacerYelverton, Osmel Dykstra, MD  metFORMIN (GLUCOPHAGE-XR) 500 MG 24 hr tablet Take 2 tablets (1,000 mg total) by mouth 2 (two) times daily. 08/05/16   Saguier, [name], PA-C  pravastatin (PRAVACHOL) 20 MG tablet Take 1 tablet (20 mg total) by mouth daily. 08/16/16   Saguier, Kateri McEdward, PA-C    Family History History reviewed. No pertinent family history.  Social History Social History  Substance Use Topics  . Smoking status: Current Every Day Smoker  . Smokeless tobacco: Never Used  . Alcohol use No     Allergies   Patient has no known allergies.   Review of Systems Review of Systems  Constitutional: Negative for chills and fever.  HENT: Negative for congestion, ear pain, hearing loss, sinus pain, sinus pressure, sore throat, trouble swallowing and voice change.   Eyes: Negative for visual disturbance.  Respiratory: Negative for cough and shortness of breath.   Cardiovascular: Negative for chest pain, palpitations and leg swelling.  Gastrointestinal: Positive for diarrhea, nausea and vomiting. Negative for abdominal pain, blood in stool and constipation.  Genitourinary: Negative for dysuria and flank pain.  Musculoskeletal: Negative for back pain, myalgias, neck pain and neck stiffness.  Skin: Negative for rash and wound.  Neurological: Positive for dizziness, syncope and light-headedness. Negative for tremors, weakness, numbness and headaches.  All other systems reviewed and are negative.    Physical Exam Updated Vital Signs BP 130/80   Pulse 96   Temp 98.7 F (37.1 C) (Oral)   Resp 13  SpO2 100%   Physical Exam  Constitutional: He is oriented to person, place, and time. He appears well-developed and well-nourished. No distress.  HENT:  Head: Normocephalic and atraumatic.  Mouth/Throat: Oropharynx is clear and moist. No oropharyngeal exudate.  Eyes: Pupils are equal, round, and reactive to light. EOM are normal.  No nystagmus  Neck: Normal range of  motion. Neck supple. No JVD present.  Cardiovascular: Normal rate, regular rhythm, normal heart sounds and intact distal pulses.  Exam reveals no gallop and no friction rub.   No murmur heard. Pulmonary/Chest: Effort normal and breath sounds normal. No respiratory distress. He has no wheezes. He has no rales. He exhibits no tenderness.  Abdominal: Soft. Bowel sounds are normal. He exhibits no distension. There is no tenderness. There is no rebound and no guarding.  Musculoskeletal: Normal range of motion. He exhibits no edema or tenderness.  No lower extremity swelling, asymmetry or tenderness. Distal pulses intact.  Neurological: He is alert and oriented to person, place, and time.  Patient is alert and oriented x3 with clear, goal oriented speech. Patient has 5/5 motor in all extremities. Sensation is intact to light touch. Bilateral finger-to-nose is normal with no signs of dysmetria.  Skin: Skin is warm and dry. Capillary refill takes less than 2 seconds. No rash noted. He is not diaphoretic. No erythema.  Psychiatric: He has a normal mood and affect. His behavior is normal.  Nursing note and vitals reviewed.    ED Treatments / Results  Labs (all labs ordered are listed, but only abnormal results are displayed) Labs Reviewed  CBC WITH DIFFERENTIAL/PLATELET - Abnormal; Notable for the following:       Result Value   WBC 10.6 (*)    All other components within normal limits  COMPREHENSIVE METABOLIC PANEL - Abnormal; Notable for the following:    Chloride 99 (*)    Glucose, Bld 194 (*)    BUN 34 (*)    Creatinine, Ser 1.34 (*)    All other components within normal limits  URINALYSIS, ROUTINE W REFLEX MICROSCOPIC - Abnormal; Notable for the following:    APPearance CLOUDY (*)    Specific Gravity, Urine >1.030 (*)    Bilirubin Urine SMALL (*)    Ketones, ur 15 (*)    Protein, ur 100 (*)    All other components within normal limits  URINALYSIS, MICROSCOPIC (REFLEX) - Abnormal;  Notable for the following:    Bacteria, UA RARE (*)    Squamous Epithelial / LPF 0-5 (*)    All other components within normal limits  TROPONIN I    EKG  EKG Interpretation None       Radiology No results found.  Procedures Procedures (including critical care time)  Medications Ordered in ED Medications  sodium chloride 0.9 % bolus 2,000 mL (0 mLs Intravenous Stopped 12/20/16 1851)  sodium chloride 0.9 % bolus 1,000 mL (0 mLs Intravenous Stopped 12/20/16 2008)  meclizine (ANTIVERT) tablet 25 mg (25 mg Oral Given 12/20/16 2013)  gi cocktail (Maalox,Lidocaine,Donnatal) (30 mLs Oral Given 12/20/16 2103)     Initial Impression / Assessment and Plan / ED Course  I have reviewed the triage vital signs and the nursing notes.  Pertinent labs & imaging results that were available during my care of the patient were reviewed by me and considered in my medical decision making (see chart for details).    Patient's lightheadedness is improved after IV fluids. He still has some spinning sensation especially with movement of  his head. Given meclizine with improvement. Requesting to be discharged home. Advised to follow up closely with his primary doctor and actually struck him plenty of fluids. Change positions slowly. Low suspicion for central cause of the patient's vertigo. He is able to ambulate without difficulty. Normal neurologic exam.  If symptoms persist may need neurologic/ent referral. Final Clinical Impressions(s) / ED Diagnoses   Final diagnoses:  Syncope and collapse  Dehydration  Orthostasis  Peripheral vertigo, unspecified laterality    New Prescriptions Discharge Medication List as of 12/20/2016 10:20 PM    START taking these medications   Details  meclizine (ANTIVERT) 25 MG tablet Take 1 tablet (25 mg total) by mouth 3 (three) times daily as needed for dizziness., Starting Fri 12/20/2016, Print         Loren Racer, MD 12/21/16 1534

## 2016-12-20 NOTE — ED Notes (Signed)
Pt has friend in room that is interpreting arabic for him. Pt states that for 1 week he has been experiencing N/V/D, and is now feeling a room spinning dizziness. Pt reports having feelings of nearly passing out this week.

## 2016-12-20 NOTE — ED Notes (Signed)
Pt reports no improvement in the dizziness.

## 2016-12-20 NOTE — ED Notes (Signed)
Pt now reporting "heartburn." No ShOB, nausea, or diaphoresis present. Repeat EKG obtained and EDP notified.

## 2016-12-20 NOTE — Patient Instructions (Addendum)
For your recent syncopal episodes, low blood pressure and dizziness, I want you to evaluated by the emergency department. The cause your symptoms might be dehydration but want to rule out any anemia. Also you might need imaging/CT of head. We will let emergency department decide that.  The vomiting could be viral related, gastroparesis or may be medication side effect?  Getting stat labs and workup will help us determine cause.  Follow up date to be determined after ED evaluation.

## 2016-12-20 NOTE — ED Notes (Signed)
ED Provider at bedside. 

## 2016-12-20 NOTE — ED Notes (Signed)
Pt ambulated in hallway. Had a steady and even gait. Still complaining of double and blurry vision. RN notified.

## 2016-12-20 NOTE — ED Notes (Signed)
Pt still reporting dizziness when he gets up.Pt also noted to have some mild swelling in the face. Resp status assessed. Lung sounds clear & =. Pt reports no difficulty with breathing.  EDP notified. See new orders.

## 2016-12-27 ENCOUNTER — Ambulatory Visit (INDEPENDENT_AMBULATORY_CARE_PROVIDER_SITE_OTHER): Payer: No Typology Code available for payment source | Admitting: Medical

## 2016-12-27 ENCOUNTER — Ambulatory Visit: Payer: No Typology Code available for payment source | Admitting: Medical

## 2016-12-27 ENCOUNTER — Encounter: Payer: Self-pay | Admitting: Medical

## 2016-12-27 VITALS — BP 111/78 | HR 102 | Temp 98.2°F | Ht 71.0 in | Wt 216.0 lb

## 2016-12-27 DIAGNOSIS — E119 Type 2 diabetes mellitus without complications: Secondary | ICD-10-CM

## 2016-12-27 NOTE — Patient Instructions (Signed)
Your blood sugars are now much improved than previously. It appears that your symptoms from the emergency department were related to viral illness and I don't think it was a side effect of your new diabetic medication. I am happy that you have been using medications over the past week with no adverse side effects and that you feel very good today.  I want you to concentrate on very healthy low sugar diet, continue metformin and continue victoza 1.2 mg daily.  We gave you to more sample pens today. In the past the majority of your medications have been very expensive and this has been a barrier to treatment. I want you to check victoza can be brought from your home country at a reasonable price. Let me know what you find out.   Follow-up in one month or as needed.

## 2016-12-27 NOTE — Progress Notes (Signed)
Subjective:    Patient ID: Thomas [name], male    DOB: [birth year], 41 y.o.   MRN: [id number]  HPI   Pt in for follow up.  He went/was sent  to ED due to syncope and weakness on last visit .Pt had one week of diarrhea and vomiting preceding the ED evaluation. It appears he was severely dehydrated. He got 4 bags of iv fluid per friend who was with him. He is on metformrin xr 500 mg 2 tab po bid. On victoza 1.2 mg a day. He states sugars have been better recently and no report of recurrent diarrhea.   He states when he used 1.8 mg dose victoza sugars dropped to 60. He felt light headed and dizzy. Since ED discharge with the metformin and victoza most of sugars about 2/3 between 130-170. About 1/3 above 170. But no sugars over 200.  He states recently he has felt very good.      Review of Systems  Constitutional: Negative for chills, fatigue and fever.  HENT: Negative for congestion, ear discharge, ear pain, facial swelling and hearing loss.   Respiratory: Negative for cough, chest tightness, shortness of breath and wheezing.   Cardiovascular: Negative for chest pain and palpitations.  Gastrointestinal: Negative for abdominal pain, diarrhea, nausea, rectal pain and vomiting.  Genitourinary: Negative for dysuria, flank pain, frequency, testicular pain and urgency.  Musculoskeletal: Negative for back pain.  Skin: Negative for rash.  Neurological: Negative for dizziness, speech difficulty, weakness, light-headedness, numbness and headaches.  Hematological: Negative for adenopathy. Does not bruise/bleed easily.  Psychiatric/Behavioral: Negative for behavioral problems and confusion.   Past Medical History:  Diagnosis Date  . Depression 01/27/2015  . Diabetes mellitus without complication (HCC)   . Hyperlipidemia   . Hypertension      Social History   Social History  . Marital status: Single    Spouse name: N/A  . Number of children: N/A  . Years of education: N/A    Occupational History  . Not on file.   Social History Main Topics  . Smoking status: Current Every Day Smoker  . Smokeless tobacco: Never Used  . Alcohol use No  . Drug use: No  . Sexual activity: Not on file   Other Topics Concern  . Not on file   Social History Narrative  . No narrative on file    Past Surgical History:  Procedure Laterality Date  . kidney stones  2009    No family history on file.  No Known Allergies  Current Outpatient Prescriptions on File Prior to Visit  Medication Sig Dispense Refill  . hydrochlorothiazide (HYDRODIURIL) 25 MG tablet TAKE 1 TABLET BY MOUTH DAILY AFTER BREAKFAST 30 tablet 2  . lisinopril (PRINIVIL,ZESTRIL) 20 MG tablet TAKE 1 TABLET BY MOUTH DAILY 30 tablet 2  . meclizine (ANTIVERT) 25 MG tablet Take 1 tablet (25 mg total) by mouth 3 (three) times daily as needed for dizziness. 30 tablet 0  . metFORMIN (GLUCOPHAGE-XR) 500 MG 24 hr tablet Take 2 tablets (1,000 mg total) by mouth 2 (two) times daily. 120 tablet 5  . fenofibrate (TRICOR) 48 MG tablet Take 1 tablet (48 mg total) by mouth daily. (Patient not taking: Reported on 12/27/2016) 30 tablet 3  . glipiZIDE (GLUCOTROL) 10 MG tablet TAKE ONE TABLET BY MOUTH DAILY BEFORE BREAKFAST (Patient not taking: Reported on 12/27/2016) 30 tablet 2  . pravastatin (PRAVACHOL) 20 MG tablet Take 1 tablet (20 mg total) by mouth daily. (Patient not taking: Reported  on 12/27/2016) 30 tablet 3   No current facility-administered medications on file prior to visit.     BP 111/78   Pulse (!) 102   Temp 98.2 F (36.8 C) (Oral)   Ht  (1.803 m)   Wt 216 lb (98 kg)   SpO2 99%   BMI 30.13 kg/m       Objective:   Physical Exam  General Mental Status- Alert. General Appearance- Not in acute distress.   Skin General: Color- Normal Color. Moisture- Normal Moisture.  Chest and Lung Exam Auscultation: Breath Sounds:-Normal.  Cardiovascular Auscultation:Rythm- Regular. Murmurs & Other  Heart Sounds:Auscultation of the heart reveals- No Murmurs.  Abdomen Inspection:-Inspeection Normal. Palpation/Percussion:Note:No mass. Palpation and Percussion of the abdomen reveal- Non Tender, Non Distended + BS, no rebound or guarding.  Neurologic Cranial Nerve exam:- CN III-XII intact(No nystagmus), symmetric smile. Strength:- 5/5 equal and symmetric strength both upper and lower extremities.      Assessment & Plan:  Your blood sugars are now much improved than previously. It appears that your symptoms from the emergency department were related to viral illness and I don't think it was a side effect of your new diabetic medication. I am happy that you have been using medications over the past week with no adverse side effects and that you feel very good today.  I want you to concentrate on very healthy low sugar diet, continue metformin and continue victoza 1.2 mg daily.  We gave you to more sample pens today. In the past the majority of your medications have been very expensive and this has been a barrier to treatment. I want you to check victoza can be brought from your home country at a reasonable price. Let me know what you find out.   Follow-up in one month or as needed.

## 2016-12-27 NOTE — Progress Notes (Signed)
Pre visit review using our clinic tool,if applicable. No additional management support is needed unless otherwise documented below in the visit note.  

## 2017-01-02 ENCOUNTER — Telehealth: Payer: Self-pay | Admitting: Medical

## 2017-01-02 MED ORDER — LIRAGLUTIDE 18 MG/3ML ~~LOC~~ SOPN: PEN_INJECTOR | SUBCUTANEOUS | 1 refills | Status: DC

## 2017-01-02 NOTE — Telephone Encounter (Signed)
I sent a prescription of generic victoza to his pharmacy so they can see how expensive it is. But also printed out prescription since he has to present that to the agency that might be helping him with his prescriptions in the near future. Apparently that assistance program might start in November.

## 2017-01-02 NOTE — Telephone Encounter (Signed)
Caller name: Jayel Relation to pt: self  Call back number: [phone] Pharmacy:  Reason for call: Pt came in office stating that is needing a new printed out rx for INSULIN DEGLUDEC-LIRAGLUTIDE [place] since pt states the printed out med list does not mentioned the dose at all and the Assistance program is helping pt to get his meds. Pt has already rx for two wks at home and is needing the rx for next month only. Please advise. (This is for [facility] Department)

## 2017-01-03 NOTE — Telephone Encounter (Signed)
Pt received rx yesterday.

## 2017-01-07 ENCOUNTER — Telehealth: Payer: Self-pay | Admitting: Medical

## 2017-01-07 NOTE — Telephone Encounter (Signed)
Instructed pt to go to ED for triage per Lowne. Pt states no fever no chills no pain. Pt states prefers to see [employer]. Scheduled pt appt with [place] tomorrow.

## 2017-01-07 NOTE — Telephone Encounter (Signed)
I'm out of Office 

## 2017-01-07 NOTE — Telephone Encounter (Addendum)
Pt states vomiting white jello-like one inch to 1/2 inch particles/ chunks. Two to seven pieces each time it happens. Pt states has happened twice in last week. Brayton Caves out of office today. Checking with Lowne for instructions.

## 2017-01-07 NOTE — Telephone Encounter (Signed)
Please call pt and get more info==== should go to er If it sounds like blood

## 2017-01-08 ENCOUNTER — Encounter: Payer: Self-pay | Admitting: Medical

## 2017-01-08 ENCOUNTER — Ambulatory Visit (INDEPENDENT_AMBULATORY_CARE_PROVIDER_SITE_OTHER): Payer: No Typology Code available for payment source | Admitting: Medical

## 2017-01-08 VITALS — BP 110/68 | HR 112 | Temp 98.1°F | Resp 16 | Ht 71.0 in | Wt 215.6 lb

## 2017-01-08 DIAGNOSIS — R112 Nausea with vomiting, unspecified: Secondary | ICD-10-CM

## 2017-01-08 DIAGNOSIS — E119 Type 2 diabetes mellitus without complications: Secondary | ICD-10-CM

## 2017-01-08 MED ORDER — ONDANSETRON 8 MG PO TBDP: 8.0000 mg | ORAL_TABLET | Freq: Three times a day (TID) | ORAL | 0 refills | Status: DC | PRN

## 2017-01-08 MED FILL — ONDANSETRON ODT 8 MG TABLET: 8 | 7 days supply | Qty: 20 | Fill #0

## 2017-01-08 NOTE — Progress Notes (Signed)
Subjective:    Patient ID: Thomas [name], male    DOB: 1976-03-04, 41 y.o.   MRN: [id number]  HPI  Pt 2 weeks ago vomited twice. He would vomit stomach contents but also will vomit gelatinous/whitish semi transparent, soft oval shaped mass  2 cm x 5 mm. He brings in two of these. But yesterday just vomited one time. He feels nausea now. States every time he eats he feels nauseous and therefore does not want to eat.(Note at one point in the interview through the interpreter he describes some undigested food particles apart from the shaped mass he brings in today)  Yesterday he vomited again as he did 2 weeks. His stools don't look black. No blood in vomitus reported. No black vomitus.  Pt sugars blood sugars and been 130, 135, and 140. He is afraid of low blood sugars. Occasional feels light headed. He is on glipizide, metformin and vicotza.  He does not have any dizziness presently on interview. No abdominal pain.     Review of Systems  Constitutional: Negative for chills, fatigue and fever.  Respiratory: Negative for chest tightness, shortness of breath and wheezing.   Cardiovascular: Negative for chest pain and palpitations.  Gastrointestinal: Positive for nausea and vomiting. Negative for abdominal distention, abdominal pain, blood in stool, constipation and diarrhea.  Neurological:       Not dizzy now.  Hematological: Negative for adenopathy. Does not bruise/bleed easily.  Psychiatric/Behavioral: Negative for behavioral problems and confusion.   Past Medical History:  Diagnosis Date  . Depression 01/27/2015  . Diabetes mellitus without complication (HCC)   . Hyperlipidemia   . Hypertension      Social History   Social History  . Marital status: Single    Spouse name: N/A  . Number of children: N/A  . Years of education: N/A   Occupational History  . Not on file.   Social History Main Topics  . Smoking status: Current Every Day Smoker  . Smokeless tobacco: Never  Used  . Alcohol use No  . Drug use: No  . Sexual activity: Not on file   Other Topics Concern  . Not on file   Social History Narrative  . No narrative on file    Past Surgical History:  Procedure Laterality Date  . kidney stones  2009    No family history on file.  No Known Allergies  Current Outpatient Prescriptions on File Prior to Visit  Medication Sig Dispense Refill  . fenofibrate (TRICOR) 48 MG tablet Take 1 tablet (48 mg total) by mouth daily. 30 tablet 3  . glipiZIDE (GLUCOTROL) 10 MG tablet TAKE ONE TABLET BY MOUTH DAILY BEFORE BREAKFAST 30 tablet 2  . hydrochlorothiazide (HYDRODIURIL) 25 MG tablet TAKE 1 TABLET BY MOUTH DAILY AFTER BREAKFAST 30 tablet 2  . INSULIN DEGLUDEC-LIRAGLUTIDE [place] Inject into the skin.    Marland Kitchen liraglutide 18 MG/3ML SOPN 1.2 mg injection daily 5 pen 1  . lisinopril (PRINIVIL,ZESTRIL) 20 MG tablet TAKE 1 TABLET BY MOUTH DAILY 30 tablet 2  . meclizine (ANTIVERT) 25 MG tablet Take 1 tablet (25 mg total) by mouth 3 (three) times daily as needed for dizziness. 30 tablet 0  . metFORMIN (GLUCOPHAGE-XR) 500 MG 24 hr tablet Take 2 tablets (1,000 mg total) by mouth 2 (two) times daily. 120 tablet 5  . pravastatin (PRAVACHOL) 20 MG tablet Take 1 tablet (20 mg total) by mouth daily. 30 tablet 3   No current facility-administered medications on file prior to visit.  BP 110/68   Pulse (!) 112   Temp 98.1 F (36.7 C) (Oral)   Resp 16   Ht  (1.803 m)   Wt 215 lb 9.6 oz (97.8 kg)   SpO2 100%   BMI 30.07 kg/m       Objective:   Physical Exam  General Appearance- Not in acute distress.  HEENT Eyes- Scleraeral/Conjuntiva-bilat- Not Yellow. Mouth & Throat- Normal.  Chest and Lung Exam Auscultation: Breath sounds:-Normal. Adventitious sounds:- No Adventitious sounds.  Cardiovascular Auscultation:Rythm - Regular. Heart Sounds -Normal heart sounds.  Abdomen Inspection:-Inspection Normal.  Palpation/Perucssion: Palpation and  Percussion of the abdomen reveal- Non Tender, No Rebound tenderness, No rigidity(Guarding) and No Palpable abdominal masses.  Liver:-Normal.  Spleen:- Normal.   Rectal Anorectal Exam: Stool - Hemoccult of stool/mucous is Heme Negative. External - normal external exam. Internal - normal sphincter tone. No rectal mass.      Assessment & Plan:  [phone](IONGEXBM Jowad)  For recent nausea and vomiting with eating, I'm prescribing Zofran.  I have some concern that you might have gastroparesis secondary from diabetes.  I am going to try to refer you to gastroenterologist. This might be difficult due to your insurance but will go ahead and get that process started.  For your diabetes I want you to continue the victoza  and metformin but stop glipizide. I don't think you will  have any low blood sugar events but if you do use foods or beverages that we discussed to bring your blood sugar back up.  Today we will order labs CBC, CMP,  amylase and lipase. I was told we can't get labs stat tonight. But I am asking staff to send labs out first thing tomorrow morning.(Did designate the labs sent out tomorrow at 8 AM to be stat)  During the interim, if you have any worsening signs or symptoms as discussed then emergency department evaluation.   Follow-up in 7-10 days or as needed. Please call your friend tomorrow morning and update him how you are feeling. Then he could call and advise me.

## 2017-01-08 NOTE — Patient Instructions (Addendum)
For recent nausea and vomiting with eating, I'm prescribing Zofran.  I have some concern that you might have gastroparesis secondary from diabetes.  I am going to try to refer you to gastroenterologist. This might be difficult due to your insurance but will go ahead and get that process started.  For your diabetes I want you to continue the victoza  and metformin but stop glipizide. I don't think you will have any low blood sugar events but if you do use foods or beverages that we discussed to bring your blood sugar back up.  Today we will order labs CBC, CMP,  amylase and lipase. I was told we can't get labs stat tonight. But I am asking staff to send labs out first thing tomorrow morning.  During the interim, if you have any worsening signs or symptoms as discussed then emergency department evaluation.   Follow-up in 7-10 days or as needed. Please call your friend tomorrow morning and update him how you are feeling. Then he could call and advise me.  Both

## 2017-01-08 NOTE — Telephone Encounter (Signed)
Patient is being seen today 01/08/17 by [name]

## 2017-01-09 LAB — COMPREHENSIVE METABOLIC PANEL
ALK PHOS: 67 U/L (ref 39–117)
ALT: 22 U/L (ref 0–53)
AST: 15 U/L (ref 0–37)
Albumin: 4.6 g/dL (ref 3.5–5.2)
BILIRUBIN TOTAL: 0.5 mg/dL (ref 0.2–1.2)
BUN: 22 mg/dL (ref 6–23)
CO2: 27 mEq/L (ref 19–32)
Calcium: 10.1 mg/dL (ref 8.4–10.5)
Chloride: 100 mEq/L (ref 96–112)
Creatinine, Ser: 1.28 mg/dL (ref 0.40–1.50)
GFR: 66.09 mL/min (ref >60.00)
GLUCOSE: 159 mg/dL — AB (ref 70–99)
Potassium: 4.6 mEq/L (ref 3.5–5.1)
SODIUM: 136 meq/L (ref 135–145)
TOTAL PROTEIN: 7.7 g/dL (ref 6.0–8.3)

## 2017-01-09 LAB — AMYLASE: Amylase: 55 U/L (ref 27–131)

## 2017-01-09 LAB — CBC WITH DIFFERENTIAL/PLATELET
BASOS ABS: 0.1 10*3/uL (ref 0.0–0.1)
BASOS PCT: 0.6 % (ref 0.0–3.0)
EOS ABS: 0.1 10*3/uL (ref 0.0–0.7)
Eosinophils Relative: 1.4 % (ref 0.0–5.0)
HEMATOCRIT: 45 % (ref 39.0–52.0)
HEMOGLOBIN: 14.9 g/dL (ref 13.0–17.0)
LYMPHS PCT: 30 % (ref 12.0–46.0)
Lymphs Abs: 3 10*3/uL (ref 0.7–4.0)
MCHC: 33.2 g/dL (ref 30.0–36.0)
MCV: 88.8 fl (ref 78.0–100.0)
MONOS PCT: 7.6 % (ref 3.0–12.0)
Monocytes Absolute: 0.8 10*3/uL (ref 0.1–1.0)
NEUTROS PCT: 60.4 % (ref 43.0–77.0)
Neutro Abs: 6 10*3/uL (ref 1.4–7.7)
Platelets: 313 10*3/uL (ref 150.0–400.0)
RBC: 5.06 Mil/uL (ref 4.22–5.81)
RDW: 13.7 % (ref 11.5–15.5)
WBC: 9.9 10*3/uL (ref 4.0–10.5)

## 2017-01-09 LAB — LIPASE: Lipase: 23 U/L (ref 11.0–59.0)

## 2017-01-28 ENCOUNTER — Other Ambulatory Visit: Payer: Self-pay | Admitting: Medical

## 2017-01-30 ENCOUNTER — Encounter: Payer: Self-pay | Admitting: Medical

## 2017-01-30 ENCOUNTER — Ambulatory Visit (INDEPENDENT_AMBULATORY_CARE_PROVIDER_SITE_OTHER): Payer: No Typology Code available for payment source | Admitting: Medical

## 2017-01-30 VITALS — BP 145/88 | HR 103 | Temp 98.1°F | Resp 16 | Ht 72.0 in | Wt 218.6 lb

## 2017-01-30 DIAGNOSIS — Z23 Encounter for immunization: Secondary | ICD-10-CM

## 2017-01-30 DIAGNOSIS — R1013 Epigastric pain: Secondary | ICD-10-CM

## 2017-01-30 DIAGNOSIS — E119 Type 2 diabetes mellitus without complications: Secondary | ICD-10-CM

## 2017-01-30 MED ORDER — TRAZODONE HCL 50 MG PO TABS: 25.0000 mg | ORAL_TABLET | Freq: Every evening | ORAL | 3 refills | Status: DC | PRN

## 2017-01-30 MED ORDER — OMEPRAZOLE 20 MG PO CPDR: 20.0000 mg | DELAYED_RELEASE_CAPSULE | Freq: Every day | ORAL | 3 refills | Status: DC

## 2017-01-30 MED ORDER — TRAZODONE HCL 50 MG PO TABS
25.0000 mg | ORAL_TABLET | Freq: Every evening | ORAL | 3 refills | Status: DC | PRN
Start: 2017-01-30 — End: 2019-03-23

## 2017-01-30 MED FILL — traZODone HCL 50 MG TABS: 50 | 30 days supply | Qty: 30 | Fill #0

## 2017-01-30 MED FILL — OMEPRAZOLE 20 MG CAP: 20 | 30 days supply | Qty: 30 | Fill #0

## 2017-01-30 NOTE — Patient Instructions (Addendum)
Your diabetes is better controlled with victoza and metformin. Low sugar diet and exercise. We gave you sample pen. If you can get vict[place]za from [place] I think that would be good option.  For epigastric pain/likley reflux rx omeprazole and try to decrease smoking and coffee.  Can get cmp and a1c. Feb 07, 2017.  For depression rx trazadone to use at night. If thoughts of harm to self or others then [facility] ED evaluation.  If GI symptoms persist despite omeprazole will again try to push referral to GI through.  Follow up in 3-4 weeks or as needed

## 2017-01-30 NOTE — Progress Notes (Signed)
Subjective:    Patient ID: Thomas [name], male    DOB: 1975/11/03, 41 y.o.   MRN: [id number]  HPI  Pt in for follow up.  He has diabetes. He bring daily am and pm readings. His sugars look on average about 150. His highest is 180. His lowest is 118.  Pt is now on victoza and metformin. This is most successful regimen up to date.  He also has hx of recent nausea and vomiting on last visit. I prescribed zofran. I have tried to refer to GI but with his insurance referral to specialist has been challenge/impossible in the past due to insurance(appears many offices don't accept his). I had concern for gastroparesis. Maybe gallbadder disease. Last visit cmp, amylase and lipase was normal. His infection fighting cells were not elevated. Now he is having epigastric pain toward throat when he lies supine. When he stands he gets better.   Pt has been trying to get help with medications through a service. I wrote a letter but they can't help him out yet.  In past he was depressed and he was on ssri sertraline he felt tired with medication and he did not sleep at night. In past he was depressed and felt better for numerous months. He stopped sertraline. Recently getting depressed again over past 1-2 months. No thoughts of suicide or harming self. He is depressed about being unemployed.   Pt will get flu vaccine today.  Review of Systems  Constitutional: Negative for chills and fatigue.  Respiratory: Negative for cough, choking, chest tightness, shortness of breath and wheezing.   Cardiovascular: Negative for chest pain and palpitations.  Gastrointestinal: Positive for abdominal pain and nausea. Negative for abdominal distention, anal bleeding, blood in stool, constipation and diarrhea.  Endocrine: Negative for polydipsia, polyphagia and polyuria.  Genitourinary: Negative for dysuria, flank pain and frequency.  Musculoskeletal: Negative for back pain.  Skin: Negative for rash.  Neurological:  Negative for dizziness and headaches.  Hematological: Negative for adenopathy. Does not bruise/bleed easily.  Psychiatric/Behavioral: Positive for dysphoric mood. Negative for behavioral problems and confusion.    Past Medical History:  Diagnosis Date  . Depression 01/27/2015  . Diabetes mellitus without complication (HCC)   . Hyperlipidemia   . Hypertension      Social History   Social History  . Marital status: Single    Spouse name: N/A  . Number of children: N/A  . Years of education: N/A   Occupational History  . Not on file.   Social History Main Topics  . Smoking status: Current Every Day Smoker  . Smokeless tobacco: Never Used  . Alcohol use No  . Drug use: No  . Sexual activity: Not on file   Other Topics Concern  . Not on file   Social History Narrative  . No narrative on file    Past Surgical History:  Procedure Laterality Date  . kidney stones  2009    No family history on file.  No Known Allergies  Current Outpatient Prescriptions on File Prior to Visit  Medication Sig Dispense Refill  . fenofibrate (TRICOR) 48 MG tablet Take 1 tablet (48 mg total) by mouth daily. 30 tablet 3  . glipiZIDE (GLUCOTROL) 10 MG tablet TAKE ONE TABLET BY MOUTH DAILY BEFORE BREAKFAST 30 tablet 2  . hydrochlorothiazide (HYDRODIURIL) 25 MG tablet TAKE ONE TABLET BY MOUTH DAILY AFTER BREAKFAST 30 tablet 1  . INSULIN DEGLUDEC-LIRAGLUTIDE [place] Inject into the skin.    Marland Kitchen liraglutide 18 MG/3ML  SOPN 1.2 mg injection daily 5 pen 1  . lisinopril (PRINIVIL,ZESTRIL) 20 MG tablet TAKE ONE TABLET BY MOUTH DAILY 30 tablet 1  . meclizine (ANTIVERT) 25 MG tablet Take 1 tablet (25 mg total) by mouth 3 (three) times daily as needed for dizziness. 30 tablet 0  . metFORMIN (GLUCOPHAGE-XR) 500 MG 24 hr tablet Take 2 tablets (1,000 mg total) by mouth 2 (two) times daily. 120 tablet 5  . ondansetron (ZOFRAN ODT) 8 MG disintegrating tablet Take 1 tablet (8 mg total) by mouth every 8 (eight) hours  as needed for nausea or vomiting. 20 tablet 0  . pravastatin (PRAVACHOL) 20 MG tablet Take 1 tablet (20 mg total) by mouth daily. 30 tablet 3   No current facility-administered medications on file prior to visit.     BP (!) 145/88   Pulse (!) 103   Temp 98.1 F (36.7 C) (Oral)   Resp 16   Ht 6' (1.829 m)   Wt 218 lb 9.6 oz (99.2 kg)   SpO2 100%   BMI 29.65 kg/m       Objective:   Physical Exam  General Appearance- Not in acute distress.  HEENT Eyes- Scleraeral/Conjuntiva-bilat- Not Yellow. Mouth & Throat- Normal.  Chest and Lung Exam Auscultation: Breath sounds:-Normal. Adventitious sounds:- No Adventitious sounds.  Cardiovascular Auscultation:Rythm - Regular. Heart Sounds -Normal heart sounds.  Abdomen Inspection:-Inspection Normal.  Palpation/Perucssion: Palpation and Percussion of the abdomen reveal- Non Tender, No Rebound tenderness, No rigidity(Guarding) and No Palpable abdominal masses.  Liver:-Normal.  Spleen:- Normal.   Neuro- CN III-XII grossly intact.     Assessment & Plan:  Your diabetes is better controlled with victoza and metformin. Low sugar diet and exercise. We gave you sample vicotoza samplepen. If you can get victoza from [place] I think that would be good option. In past many diabetic meds have been too expensive. He has family that live [place] and often bring medication when they visit.  For epigastric pain/likley reflux rx omeprazole and try to decrease smoking and coffee.  Can get cmp and a1c. Feb 07, 2017.   For depression rx trazadone to use at night. If thoughts of harm to self or others then [facility] ED evaluation.  If GI symptoms persist despite omeprazole will again try to push referral to GI through.  Follow up in 3-4 weeks or as needed  Olanrewaju Osborn, [name], VF CorporationPA-C

## 2017-02-20 ENCOUNTER — Encounter: Payer: Self-pay | Admitting: Medical

## 2017-02-24 ENCOUNTER — Ambulatory Visit (INDEPENDENT_AMBULATORY_CARE_PROVIDER_SITE_OTHER): Payer: No Typology Code available for payment source | Admitting: Medical

## 2017-02-24 ENCOUNTER — Encounter: Payer: Self-pay | Admitting: Medical

## 2017-02-24 VITALS — BP 138/85 | HR 113 | Temp 98.2°F | Resp 16 | Ht 74.0 in | Wt 218.4 lb

## 2017-02-24 DIAGNOSIS — E119 Type 2 diabetes mellitus without complications: Secondary | ICD-10-CM

## 2017-02-24 DIAGNOSIS — R197 Diarrhea, unspecified: Secondary | ICD-10-CM

## 2017-02-24 DIAGNOSIS — K219 Gastro-esophageal reflux disease without esophagitis: Secondary | ICD-10-CM

## 2017-02-24 DIAGNOSIS — R1084 Generalized abdominal pain: Secondary | ICD-10-CM

## 2017-02-24 DIAGNOSIS — F32A Depression, unspecified: Secondary | ICD-10-CM

## 2017-02-24 DIAGNOSIS — F329 Major depressive disorder, single episode, unspecified: Secondary | ICD-10-CM

## 2017-02-24 LAB — COMPREHENSIVE METABOLIC PANEL
ALT: 20 U/L (ref 0–53)
AST: 14 U/L (ref 0–37)
Albumin: 4.2 g/dL (ref 3.5–5.2)
Alkaline Phosphatase: 80 U/L (ref 39–117)
BILIRUBIN TOTAL: 0.3 mg/dL (ref 0.2–1.2)
BUN: 17 mg/dL (ref 6–23)
CALCIUM: 9.8 mg/dL (ref 8.4–10.5)
CO2: 27 meq/L (ref 19–32)
Chloride: 101 mEq/L (ref 96–112)
Creatinine, Ser: 0.94 mg/dL (ref 0.40–1.50)
GFR: 94.32 mL/min (ref >60.00)
GLUCOSE: 179 mg/dL — AB (ref 70–99)
Potassium: 4.1 mEq/L (ref 3.5–5.1)
Sodium: 137 mEq/L (ref 135–145)
Total Protein: 7.7 g/dL (ref 6.0–8.3)

## 2017-02-24 LAB — CBC WITH DIFFERENTIAL/PLATELET
BASOS ABS: 0.1 10*3/uL (ref 0.0–0.1)
Basophils Relative: 0.6 % (ref 0.0–3.0)
EOS ABS: 0.2 10*3/uL (ref 0.0–0.7)
Eosinophils Relative: 1.6 % (ref 0.0–5.0)
HEMATOCRIT: 45.5 % (ref 39.0–52.0)
Hemoglobin: 15 g/dL (ref 13.0–17.0)
LYMPHS PCT: 23.6 % (ref 12.0–46.0)
Lymphs Abs: 2.5 10*3/uL (ref 0.7–4.0)
MCHC: 33 g/dL (ref 30.0–36.0)
MCV: 88.2 fl (ref 78.0–100.0)
Monocytes Absolute: 0.7 10*3/uL (ref 0.1–1.0)
Monocytes Relative: 6.7 % (ref 3.0–12.0)
NEUTROS ABS: 7.2 10*3/uL (ref 1.4–7.7)
NEUTROS PCT: 67.5 % (ref 43.0–77.0)
PLATELETS: 334 10*3/uL (ref 150.0–400.0)
RBC: 5.16 Mil/uL (ref 4.22–5.81)
RDW: 13.5 % (ref 11.5–15.5)
WBC: 10.6 10*3/uL — ABNORMAL HIGH (ref 4.0–10.5)

## 2017-02-24 LAB — LIPASE: Lipase: 23 U/L (ref 11.0–59.0)

## 2017-02-24 LAB — HEMOGLOBIN A1C: Hgb A1c MFr Bld: 8.2 % — ABNORMAL HIGH (ref 4.6–6.5)

## 2017-02-24 LAB — AMYLASE: Amylase: 51 U/L (ref 27–131)

## 2017-02-24 NOTE — Progress Notes (Signed)
Subjective:    Patient ID: Thomas [name], male    DOB: 05-Oct-1975, 41 y.o.   MRN: [id number]  HPI  Pt in for follow up. His sugar is 175 today. He did not eat or take any bp medications today.  Pt is still on victoza and metformin.   Pt did not make it to GI appointment. Pt does not speak english well. GI office left message with pt friend. Pt states he was not aware of appointment.  Pt has loose stools for 2-3 weeks. Little bit of watery stools but not much. He has some intermittent scattered areas of mild abdomen pain both lower quadrants and some epigastric pain. Pt has been on metformin in past. Metformin is not new.   Pt wakes up at times at night with pains and gas feeling.  On last visit I rx omeprazole.   Pt had some decreased mood and insomnia. He did not take the trazadone.    Review of Systems  Gastrointestinal: Positive for abdominal distention, abdominal pain and diarrhea. Negative for constipation, nausea and vomiting.  Musculoskeletal: Negative for back pain, myalgias, neck pain and neck stiffness.  Neurological: Negative for dizziness, seizures, weakness and headaches.  Hematological: Negative for adenopathy. Does not bruise/bleed easily.  Psychiatric/Behavioral: Positive for dysphoric mood and sleep disturbance. Negative for behavioral problems and suicidal ideas. The patient is not nervous/anxious.     Past Medical History:  Diagnosis Date  . Depression 01/27/2015  . Diabetes mellitus without complication (HCC)   . Hyperlipidemia   . Hypertension      Social History   Socioeconomic History  . Marital status: Single    Spouse name: Not on file  . Number of children: Not on file  . Years of education: Not on file  . Highest education level: Not on file  Social Needs  . Financial resource strain: Not on file  . Food insecurity - worry: Not on file  . Food insecurity - inability: Not on file  . Transportation needs - medical: Not on file  .  Transportation needs - non-medical: Not on file  Occupational History  . Not on file  Tobacco Use  . Smoking status: Current Every Day Smoker  . Smokeless tobacco: Never Used  Substance and Sexual Activity  . Alcohol use: No    Alcohol/week: 0.0 oz  . Drug use: No  . Sexual activity: Not on file  Other Topics Concern  . Not on file  Social History Narrative  . Not on file    Past Surgical History:  Procedure Laterality Date  . kidney stones  2009    No family history on file.  No Known Allergies  Current Outpatient Medications on File Prior to Visit  Medication Sig Dispense Refill  . fenofibrate (TRICOR) 48 MG tablet Take 1 tablet (48 mg total) by mouth daily. 30 tablet 3  . glipiZIDE (GLUCOTROL) 10 MG tablet TAKE ONE TABLET BY MOUTH DAILY BEFORE BREAKFAST 30 tablet 2  . hydrochlorothiazide (HYDRODIURIL) 25 MG tablet TAKE ONE TABLET BY MOUTH DAILY AFTER BREAKFAST 30 tablet 1  . INSULIN DEGLUDEC-LIRAGLUTIDE [place] Inject into the skin.    Marland Kitchen. liraglutide 18 MG/3ML SOPN 1.2 mg injection daily 5 pen 1  . lisinopril (PRINIVIL,ZESTRIL) 20 MG tablet TAKE ONE TABLET BY MOUTH DAILY 30 tablet 1  . meclizine (ANTIVERT) 25 MG tablet Take 1 tablet (25 mg total) by mouth 3 (three) times daily as needed for dizziness. 30 tablet 0  . metFORMIN (GLUCOPHAGE-XR) 500  MG 24 hr tablet Take 2 tablets (1,000 mg total) by mouth 2 (two) times daily. 120 tablet 5  . omeprazole (PRILOSEC) 20 MG capsule Take 1 capsule (20 mg total) by mouth daily. 30 capsule 3  . ondansetron (ZOFRAN ODT) 8 MG disintegrating tablet Take 1 tablet (8 mg total) by mouth every 8 (eight) hours as needed for nausea or vomiting. 20 tablet 0  . pravastatin (PRAVACHOL) 20 MG tablet Take 1 tablet (20 mg total) by mouth daily. 30 tablet 3  . traZODone (DESYREL) 50 MG tablet Take 0.5-1 tablets (25-50 mg total) by mouth at bedtime as needed for sleep. 30 tablet 3   No current facility-administered medications on file prior to visit.      BP 138/85   Pulse (!) 113   Temp 98.2 F (36.8 C) (Oral)   Resp 16   Ht 6\' 2"  (1.88 m)   Wt 218 lb 6.4 oz (99.1 kg)   SpO2 100%   BMI 28.04 kg/m       Objective:   Physical Exam  General Appearance- Not in acute distress.  HEENT Eyes- Scleraeral/Conjuntiva-bilat- Not Yellow. Mouth & Throat- Normal.  Chest and Lung Exam Auscultation: Breath sounds:-Normal. Adventitious sounds:- No Adventitious sounds.  Cardiovascular Auscultation:Rythm - Regular. Heart Sounds -Normal heart sounds.  Abdomen Inspection:-Inspection Normal.  Palpation/Perucssion: Palpation and Percussion of the abdomen reveal- mild diffuse faint Tender, No Rebound tenderness, No rigidity(Guarding) and No Palpable abdominal masses.  Liver:-Normal.  Spleen:- Normal.   Back- no cva tenderness.      Assessment & Plan:  Pt friend [phone].  For recent mild generalized abdominal pain, ordered CBC, CMP, amylase, and lipase.  Also H. pylori breath test.  Please continue omeprazole.  For the loose stools ,I added stool panel studies to be done today.  Can use Imodium ad over-the-counter.  Also eat bland foods.  For diabetes, will get A1c today.  Continue current regimen for diabetes.  Since you have been on metformin for numerous months I do not think metformin is the cause of loose stools.  For depression with some insomnia, I do want you to try the trazodone.  Please call today and try to reschedule your appointment with GI.  We gave you the number.  Follow-up date to be determined after lab review.  If any severe abdomen pain with worsening symptoms and emergency department evaluation after hours.  Channah Godeaux, [name], PA-C

## 2017-02-24 NOTE — Patient Instructions (Addendum)
For recent mild generalized abdominal pain, ordered CBC, CMP, amylase, and lipase.  Also H. pylori breath test.  Please continue omeprazole.  For the loose stools ,I added stool panel studies to be done today.  Can use Imodium ad over-the-counter.  Also eat bland foods.  For diabetes, will get A1c today.  Continue current regimen for diabetes.  Since you have been on metformin for numerous months I do not think metformin is cause of loose stools.  For depression with some insomnia, I do want you to try the trazodone.  Please call today and try to reschedule your appointment with GI.  We gave you the number. You will need to call since appointment missed.  Follow-up date to be determined after lab review.  If any severe abdomen pain with worsening symptoms and emergency department evaluation after hours.

## 2017-02-25 NOTE — Addendum Note (Signed)
Addended by: Harley AltoPRICE, Lian Pounds M on: 02/25/2017 11:36 AM   Modules accepted: Orders

## 2017-02-26 ENCOUNTER — Telehealth: Payer: Self-pay | Admitting: Medical

## 2017-02-26 LAB — CLOSTRIDIUM DIFFICILE BY PCR: Toxigenic C. Difficile by PCR: NOT DETECTED

## 2017-02-26 LAB — TIQ-NTM

## 2017-02-26 LAB — H. PYLORI BREATH TEST: H. pylori Breath Test: DETECTED — AB

## 2017-02-26 MED ORDER — AMOXICILLIN 500 MG PO CAPS: ORAL_CAPSULE | ORAL | 0 refills | Status: DC

## 2017-02-26 MED ORDER — CLARITHROMYCIN 500 MG PO TABS: 500.0000 mg | ORAL_TABLET | Freq: Two times a day (BID) | ORAL | 0 refills | Status: DC

## 2017-02-26 NOTE — Telephone Encounter (Signed)
Prescription of amoxicillin and clarithromycin sent to patient's pharmacy

## 2017-02-28 ENCOUNTER — Other Ambulatory Visit: Payer: Self-pay | Admitting: Medical

## 2017-03-01 LAB — STOOL CULTURE
MICRO NUMBER: 81277902
MICRO NUMBER: 81277903
MICRO NUMBER: 81277904
SHIGA RESULT:: NOT DETECTED
SPECIMEN QUALITY: ADEQUATE
SPECIMEN QUALITY:: ADEQUATE
SPECIMEN QUALITY:: ADEQUATE

## 2017-03-03 LAB — OVA AND PARASITE EXAMINATION
CONCENTRATE RESULT: NONE SEEN
TRICHROME RESULT: NONE SEEN

## 2017-03-27 ENCOUNTER — Encounter: Payer: Self-pay | Admitting: Medical

## 2017-03-27 ENCOUNTER — Ambulatory Visit: Payer: No Typology Code available for payment source | Admitting: Medical

## 2017-03-27 VITALS — BP 130/80 | HR 114 | Temp 98.8°F | Resp 16 | Wt 220.2 lb

## 2017-03-27 DIAGNOSIS — E119 Type 2 diabetes mellitus without complications: Secondary | ICD-10-CM

## 2017-03-27 DIAGNOSIS — K219 Gastro-esophageal reflux disease without esophagitis: Secondary | ICD-10-CM

## 2017-03-27 DIAGNOSIS — I10 Essential (primary) hypertension: Secondary | ICD-10-CM

## 2017-03-27 MED ORDER — OMEPRAZOLE 20 MG PO CPDR: 20.0000 mg | DELAYED_RELEASE_CAPSULE | Freq: Every day | ORAL | 3 refills | Status: DC

## 2017-03-27 NOTE — Progress Notes (Signed)
Subjective:    Patient ID: Thomas CurdHaider Burget, male    DOB: 1975-07-02, 41 y.o.   MRN: [id number]  HPI  Pt in for follow up.  Pt is on victoza and metformin. Pt sugars have been around 230. On metformin xr 500 2 tab po bid. Pt has been on vicotza 1.2 mg recently. He has never been on 1.8 mg of victoza. Pt states with 1.2 mg his sugars have been 150 at lowest. [facility]   Pt blood pressure is borderline today. Pt is on lisinopril 20 mg a day.  Pt stomach feels better with omeprazole 20 mg. I wrote for daily use. He is using it 1 tab every 3 days.  H pylori was + in November. He took antibiotic and felt better.     Review of Systems  Constitutional: Negative for chills, fatigue and fever.  Respiratory: Negative for cough, chest tightness and wheezing.   Cardiovascular: Negative for chest pain and palpitations.  Gastrointestinal: Negative for abdominal distention, abdominal pain, diarrhea, nausea and vomiting.  Endocrine: Negative for polydipsia, polyphagia and polyuria.  Genitourinary: Negative for dysuria and frequency.  Musculoskeletal: Negative for back pain.  Skin: Negative for rash.  Neurological: Negative for dizziness, weakness and headaches.  Hematological: Negative for adenopathy. Does not bruise/bleed easily.  Psychiatric/Behavioral: Negative for behavioral problems and confusion.   Past Medical History:  Diagnosis Date  . Depression 01/27/2015  . Diabetes mellitus without complication (HCC)   . Hyperlipidemia   . Hypertension      Social History   Socioeconomic History  . Marital status: Single    Spouse name: Not on file  . Number of children: Not on file  . Years of education: Not on file  . Highest education level: Not on file  Social Needs  . Financial resource strain: Not on file  . Food insecurity - worry: Not on file  . Food insecurity - inability: Not on file  . Transportation needs -  medical: Not on file  . Transportation needs - non-medical: Not on file  Occupational History  . Not on file  Tobacco Use  . Smoking status: Current Every Day Smoker  . Smokeless tobacco: Never Used  Substance and Sexual Activity  . Alcohol use: No    Alcohol/week: 0.0 oz  . Drug use: No  . Sexual activity: Not on file  Other Topics Concern  . Not on file  Social History Narrative  . Not on file    Past Surgical History:  Procedure Laterality Date  . kidney stones  2009    No family history on file.  No Known Allergies  Current Outpatient Medications on File Prior to Visit  Medication Sig Dispense Refill  . amoxicillin (AMOXIL) 500 MG capsule 2 tablets p.o. twice daily times 10 days. 40 capsule 0  . clarithromycin (BIAXIN) 500 MG tablet Take 1 tablet (500 mg total) 2 (two) times daily by mouth. 20 tablet 0  . fenofibrate (TRICOR) 48 MG tablet Take 1 tablet (48 mg total) by mouth daily. 30 tablet 3  . glipiZIDE (GLUCOTROL) 10 MG tablet TAKE ONE TABLET BY MOUTH DAILY BEFORE BREAKFAST 30 tablet 2  . hydrochlorothiazide (HYDRODIURIL) 25 MG tablet TAKE ONE TABLET BY MOUTH DAILY AFTER BREAKFAST 30 tablet 1  . INSULIN DEGLUDEC-LIRAGLUTIDE [place] Inject into the skin.    Marland Kitchen. liraglutide 18 MG/3ML SOPN 1.2 mg injection daily 5 pen 1  . lisinopril (PRINIVIL,ZESTRIL) 20 MG tablet  TAKE ONE TABLET BY MOUTH DAILY 30 tablet 1  . meclizine (ANTIVERT) 25 MG tablet Take 1 tablet (25 mg total) by mouth 3 (three) times daily as needed for dizziness. 30 tablet 0  . metFORMIN (GLUCOPHAGE-XR) 500 MG 24 hr tablet TAKE TWO TABLETS BY MOUTH TWICE A DAY 120 tablet 4  . omeprazole (PRILOSEC) 20 MG capsule Take 1 capsule (20 mg total) by mouth daily. 30 capsule 3  . ondansetron (ZOFRAN ODT) 8 MG disintegrating tablet Take 1 tablet (8 mg total) by mouth every 8 (eight) hours as needed for nausea or vomiting. 20 tablet 0  . pravastatin (PRAVACHOL) 20 MG tablet Take 1 tablet (20 mg total) by mouth daily. 30  tablet 3  . traZODone (DESYREL) 50 MG tablet Take 0.5-1 tablets (25-50 mg total) by mouth at bedtime as needed for sleep. 30 tablet 3   No current facility-administered medications on file prior to visit.     BP 130/80   Pulse (!) 14   Temp 98.8 F (37.1 C) (Oral)   Resp 16   Wt 220 lb 3.2 oz (99.9 kg)   SpO2 98%   BMI 28.27 kg/m       Objective:   Physical Exam  General Mental Status- Alert. General Appearance- Not in acute distress.   Skin General: Color- Normal Color. Moisture- Normal Moisture.  Neck Carotid Arteries- Normal color. Moisture- Normal Moisture. No carotid bruits. No JVD.  Chest and Lung Exam Auscultation: Breath Sounds:-Normal.  Cardiovascular Auscultation:Rythm- Regular. Murmurs & Other Heart Sounds:Auscultation of the heart reveals- No Murmurs.  Abdomen Inspection:-Inspeection Normal. Palpation/Percussion:Note:No mass. Palpation and Percussion of the abdomen reveal- Non Tender, Non Distended + BS, no rebound or guarding.   Neurologic Cranial Nerve exam:- CN III-XII intact(No nystagmus), symmetric smile. Strength:- 5/5 equal and symmetric strength both upper and lower extremities.  Lower ext- see quality metrics     Assessment & Plan:  Your sugars are better but not ideal. I do think it would be beneficial for your sugars to be better controlled. I do think you can increase victoza to 1.8 mg if you can. If you feel poorer with better control can alternate to lower dose of 1.2 mg. Historically you report feeling worse as we brought your blood sugars down to more reasonable level. But if you feel poorly do check sugars to make sure no low blood sugars. Any sugars less than 70 eat appropiate foods or drink beverage to increase sugar. Continue metformin.  BP well controlled today. Continue metformin.  For gerd continue omeprazole. Refilling omeprazole.   Follow up May 29, 2016 or as needed. On that day get fasting labs and check a1c.  Beyonca Wisz,  [name], PA-C

## 2017-03-27 NOTE — Patient Instructions (Addendum)
Your sugars are better but not ideal. I do think it would be beneficial for your sugars to be better controlled. I do think you can increase victoza to 1.8 mg if you can. If you feel poorer with better control can alternate to lower dose of 1.2 mg. Historically you report feeling worse as we brought your blood sugars down to more reasonable level. But if you feel poorly do check sugars to make sure no low blood sugars. Any sugars less than 70 eat appropiate foods or drink beverage to increase sugar. Continue metformin.  BP well controlled today. Continue metformin.  For gerd continue omeprazole. Refilling omeprazole.   Follow up May 29, 2016 or as needed. On that day get fasting labs and check a1c.

## 2017-03-31 ENCOUNTER — Other Ambulatory Visit: Payer: Self-pay | Admitting: Medical

## 2017-05-02 ENCOUNTER — Other Ambulatory Visit: Payer: Self-pay | Admitting: Medical

## 2017-05-21 LAB — HM DIABETES EYE EXAM

## 2017-05-29 ENCOUNTER — Ambulatory Visit: Payer: No Typology Code available for payment source | Admitting: Medical

## 2017-05-29 ENCOUNTER — Other Ambulatory Visit: Payer: Self-pay | Admitting: Medical

## 2017-05-29 ENCOUNTER — Encounter: Payer: Self-pay | Admitting: Medical

## 2017-05-29 VITALS — BP 130/80 | HR 106 | Temp 97.8°F | Resp 16 | Ht 72.0 in | Wt 220.4 lb

## 2017-05-29 DIAGNOSIS — E785 Hyperlipidemia, unspecified: Secondary | ICD-10-CM

## 2017-05-29 DIAGNOSIS — B351 Tinea unguium: Secondary | ICD-10-CM

## 2017-05-29 DIAGNOSIS — E119 Type 2 diabetes mellitus without complications: Secondary | ICD-10-CM

## 2017-05-29 DIAGNOSIS — I10 Essential (primary) hypertension: Secondary | ICD-10-CM

## 2017-05-29 LAB — LIPID PANEL
Cholesterol: 185 mg/dL (ref 0–200)
HDL: 31.7 mg/dL — AB (ref >39.00)
NONHDL: 153.37
Total CHOL/HDL Ratio: 6
Triglycerides: 219 mg/dL — ABNORMAL HIGH (ref 0.0–149.0)
VLDL: 43.8 mg/dL — ABNORMAL HIGH (ref 0.0–40.0)

## 2017-05-29 LAB — COMPREHENSIVE METABOLIC PANEL
ALBUMIN: 4.3 g/dL (ref 3.5–5.2)
ALK PHOS: 87 U/L (ref 39–117)
ALT: 30 U/L (ref 0–53)
AST: 18 U/L (ref 0–37)
BILIRUBIN TOTAL: 0.5 mg/dL (ref 0.2–1.2)
BUN: 16 mg/dL (ref 6–23)
CO2: 29 mEq/L (ref 19–32)
CREATININE: 0.98 mg/dL (ref 0.40–1.50)
Calcium: 9.5 mg/dL (ref 8.4–10.5)
Chloride: 100 mEq/L (ref 96–112)
GFR: 89.77 mL/min (ref >60.00)
GLUCOSE: 153 mg/dL — AB (ref 70–99)
POTASSIUM: 4.1 meq/L (ref 3.5–5.1)
SODIUM: 135 meq/L (ref 135–145)
TOTAL PROTEIN: 8 g/dL (ref 6.0–8.3)

## 2017-05-29 LAB — HEMOGLOBIN A1C: Hgb A1c MFr Bld: 8.9 % — ABNORMAL HIGH (ref 4.6–6.5)

## 2017-05-29 LAB — LDL CHOLESTEROL, DIRECT: LDL DIRECT: 112 mg/dL

## 2017-05-29 MED ORDER — CICLOPIROX 8 % EX SOLN: Freq: Every day | CUTANEOUS | 0 refills | Status: DC

## 2017-05-29 NOTE — Progress Notes (Signed)
Subjective:    Patient ID: Thomas [name], male    DOB: 30-Nov-1975, 41 y.o.   MRN: [id number]  HPI   Pt in for follow up on diabetes, htn and hyperlipidemia.  Pt sugars have been 100-140 when he checks at home.. Feeling well. Pt is now on victoza and metformin.  He states occasionally he will feel slightly lightheaded if he gets his sugar level and high 70s.  This is very rare.  States when that occurs he will eat something and slight lightheaded sensation will go away.  He never reports any sugars less than 70.  For htn on lisinopril and hctz.  Pt triglycerides have been high in the past. Will get lipid panel.      Review of Systems  Constitutional: Negative for chills, fatigue and fever.  Respiratory: Negative for cough, chest tightness, shortness of breath and wheezing.   Cardiovascular: Negative for chest pain and palpitations.  Musculoskeletal: Negative for arthralgias, back pain and gait problem.  Skin: Negative for rash.  Neurological: Negative for seizures, facial asymmetry, speech difficulty, weakness and light-headedness.  Hematological: Negative for adenopathy. Does not bruise/bleed easily.  Psychiatric/Behavioral: Negative for behavioral problems, confusion and suicidal ideas. The patient is not nervous/anxious.     Past Medical History:  Diagnosis Date  . Depression 01/27/2015  . Diabetes mellitus without complication (HCC)   . Hyperlipidemia   . Hypertension      Social History   Socioeconomic History  . Marital status: Single    Spouse name: Not on file  . Number of children: Not on file  . Years of education: Not on file  . Highest education level: Not on file  Social Needs  . Financial resource strain: Not on file  . Food insecurity - worry: Not on file  . Food insecurity - inability: Not on file  . Transportation needs - medical: Not on file  . Transportation needs - non-medical: Not on file  Occupational History  . Not on file  Tobacco Use    . Smoking status: Current Every Day Smoker  . Smokeless tobacco: Never Used  Substance and Sexual Activity  . Alcohol use: No    Alcohol/week: 0.0 oz  . Drug use: No  . Sexual activity: Not on file  Other Topics Concern  . Not on file  Social History Narrative  . Not on file    Past Surgical History:  Procedure Laterality Date  . kidney stones  2009    No family history on file.  No Known Allergies  Current Outpatient Medications on File Prior to Visit  Medication Sig Dispense Refill  . amoxicillin (AMOXIL) 500 MG capsule 2 tablets p.o. twice daily times 10 days. 40 capsule 0  . clarithromycin (BIAXIN) 500 MG tablet Take 1 tablet (500 mg total) 2 (two) times daily by mouth. 20 tablet 0  . fenofibrate (TRICOR) 48 MG tablet Take 1 tablet (48 mg total) by mouth daily. 30 tablet 3  . hydrochlorothiazide (HYDRODIURIL) 25 MG tablet TAKE ONE TABLET BY MOUTH DAILY AFTER BREAKFAST 30 tablet 0  . INSULIN DEGLUDEC-LIRAGLUTIDE [place] Inject into the skin.    Marland Kitchen liraglutide 18 MG/3ML SOPN 1.2 mg injection daily 5 pen 1  . lisinopril (PRINIVIL,ZESTRIL) 20 MG tablet TAKE ONE TABLET BY MOUTH DAILY 30 tablet 0  . meclizine (ANTIVERT) 25 MG tablet Take 1 tablet (25 mg total) by mouth 3 (three) times daily as needed for dizziness. 30 tablet 0  . metFORMIN (GLUCOPHAGE-XR) 500 MG 24  hr tablet TAKE TWO TABLETS BY MOUTH TWICE A DAY 120 tablet 4  . omeprazole (PRILOSEC) 20 MG capsule Take 1 capsule (20 mg total) by mouth daily. 30 capsule 3  . ondansetron (ZOFRAN ODT) 8 MG disintegrating tablet Take 1 tablet (8 mg total) by mouth every 8 (eight) hours as needed for nausea or vomiting. 20 tablet 0  . pravastatin (PRAVACHOL) 20 MG tablet Take 1 tablet (20 mg total) by mouth daily. 30 tablet 3  . traZODone (DESYREL) 50 MG tablet Take 0.5-1 tablets (25-50 mg total) by mouth at bedtime as needed for sleep. 30 tablet 3   No current facility-administered medications on file prior to visit.     BP (!) 133/93    Pulse (!) 106   Temp 97.8 F (36.6 C) (Oral)   Resp 16   Ht 6' (1.829 m)   Wt 220 lb 6.4 oz (100 kg)   SpO2 100%   BMI 29.89 kg/m       Objective:   Physical Exam  General Mental Status- Alert. General Appearance- Not in acute distress.   Skin General: Color- Normal Color. Moisture- Normal Moisture.  Neck Carotid Arteries- Normal color. Moisture- Normal Moisture. No carotid bruits. No JVD.  Chest and Lung Exam Auscultation: Breath Sounds:-Normal.  Cardiovascular Auscultation:Rythm- Regular. Murmurs & Other Heart Sounds:Auscultation of the heart reveals- No Murmurs.  Abdomen Inspection:-Inspeection Normal. Palpation/Percussion:Note:No mass. Palpation and Percussion of the abdomen reveal- Non Tender, Non Distended + BS, no rebound or guarding.   Neurologic Cranial Nerve exam:- CN III-XII intact(No nystagmus), symmetric smile. Strength:- 5/5 equal and symmetric strength both upper and lower extremities.  Toes- various toes on both feet thick disfigured/discolored nails.     Assessment & Plan:  Your blood sugar readings at home have been very good.  We will check your A1c today.  If your A1c is real tightly controlled and you occasionally get sugar levels less than 70 then I might decrease dosage of your metformin in the future.  For your high cholesterol we will recheck your lipid panel today and see if your triglycerides have improved with better glucose control.  Your blood pressure is well controlled today and continue your lisinopril and HCTZ.  You do appear to have some toenail fungus on exam.  I wrote you for Penlac to use as instructed.  Follow-up in 3 months or as needed.  Esperanza RichtersEdward Samanthia Howland, PA-C

## 2017-05-29 NOTE — Patient Instructions (Signed)
Your blood sugar readings at home have been very good.  We will check your A1c today.  If your A1c is real tightly controlled and you occasionally get sugar levels less than 70 then I might decrease dosage of your metformin in the future.  For your high cholesterol we will recheck your lipid panel today and see if your triglycerides have improved with better glucose control.  Your blood pressure is well controlled today and continue your lisinopril and HCTZ.  You do appear to have some toenail fungus on exam.  I wrote you for Penlac to use as instructed.  Follow-up in 3 months or as needed.

## 2017-06-06 ENCOUNTER — Encounter: Payer: Self-pay | Admitting: Medical

## 2017-08-07 ENCOUNTER — Other Ambulatory Visit: Payer: Self-pay | Admitting: Medical

## 2017-08-07 ENCOUNTER — Other Ambulatory Visit: Payer: Self-pay

## 2017-08-07 MED ORDER — LIRAGLUTIDE 18 MG/3ML ~~LOC~~ SOPN: PEN_INJECTOR | SUBCUTANEOUS | 1 refills | Status: DC

## 2017-08-07 NOTE — Telephone Encounter (Signed)
Copied from CRM [phone]. Topic: Quick Communication - Rx Refill/Question >> Aug 07, 2017 11:00 AM Eston Mouldavis, Haila Dena B wrote: Medication: liraglutide 18 MG/3ML SOPN  Has the patient contacted their pharmacy? yes  (Agent: If no, request that the patient contact the pharmacy for the refill.)  Preferred Pharmacy (with phone number or street name): [facility] [place] - [address] [phone] (Phone) [phone] (Fax)      Agent: Please be advised that RX refills may take up to 3 business days. We ask that you follow-up with your pharmacy.

## 2017-08-08 NOTE — Telephone Encounter (Signed)
Rx faxed to pharmacy  

## 2017-08-19 ENCOUNTER — Other Ambulatory Visit: Payer: Self-pay | Admitting: Medical

## 2017-08-26 ENCOUNTER — Ambulatory Visit: Payer: No Typology Code available for payment source | Admitting: Medical

## 2017-08-26 ENCOUNTER — Encounter: Payer: Self-pay | Admitting: Medical

## 2017-08-26 VITALS — BP 132/86 | HR 68 | Temp 98.0°F | Resp 16 | Ht 72.0 in | Wt 216.8 lb

## 2017-08-26 DIAGNOSIS — E119 Type 2 diabetes mellitus without complications: Secondary | ICD-10-CM

## 2017-08-26 DIAGNOSIS — R252 Cramp and spasm: Secondary | ICD-10-CM

## 2017-08-26 DIAGNOSIS — E785 Hyperlipidemia, unspecified: Secondary | ICD-10-CM

## 2017-08-26 MED ORDER — OMEPRAZOLE 20 MG PO CPDR
20.0000 mg | DELAYED_RELEASE_CAPSULE | Freq: Every day | ORAL | 3 refills | Status: DC
Start: 2017-08-26 — End: 2019-03-23

## 2017-08-26 MED ORDER — ONDANSETRON 8 MG PO TBDP: 8.0000 mg | ORAL_TABLET | Freq: Three times a day (TID) | ORAL | 0 refills | Status: DC | PRN

## 2017-08-26 NOTE — Patient Instructions (Signed)
Your blood sugar average was 210 on last a1c on last visit. We were never able to contact you. I had wanted to increase your victoza to 1.8 mg daily but you report some low blood sugars. So after we repeat a1c tomorrow. Might have you decrease your metformin to 1 tab twice daily and check your blood sugars daily to make sure no blood sugars less than 90. But I will need to check a1c first then will call you friend Kandace Parkins who always translates for you. No changes presently.  We will also check your lipid panel tomorrow.  In addition we will get metabolic panel to check your liver function and electrolytes.  We will include a magnesium level to work-up your right calf cramping.  You are taking a diuretic so it is possible that she might have some recent low potassium.  Follow-up date to be determined after lab review.  Please schedule your appointments in the future when Kandace Parkins can attend.

## 2017-08-26 NOTE — Progress Notes (Signed)
Subjective:    Patient ID: Thomas [name], male    DOB: 1976/01/25, 42 y.o.   MRN: [id number]  HPI  Pt in for follow up.  Pt comes in by himself. He usually comes in with his translator.  No one called him on labs done on 05/29/2017.  Pt was only using 1.2 mg of Victoza. He in past thought 1.8 mg was too strong. He would get some sugar levels of 50-70 on occasion but describes this occurring rarely but significant enough.   Pt in past has heart burn/gastritis. I had in the past given omeprazole and zofran. He ran out of those about 2 months ago. He got zantac filled.  Recent over past month he has some easily induced rt leg cramping on acitivity. But then on rest the cramping eases up quickly. He will rest or 5 minutes and calf cramping resolves quickly and entirely.  Review of Systems  Constitutional: Negative for chills and fever.  HENT: Negative for congestion, drooling and ear pain.   Respiratory: Negative for cough, chest tightness, shortness of breath and wheezing.   Cardiovascular: Negative for chest pain and palpitations.  Gastrointestinal: Positive for abdominal pain. Negative for rectal pain.       Heart burn.  Genitourinary: Negative for difficulty urinating, enuresis, frequency and genital sores.  Musculoskeletal: Negative for gait problem.       Rt calf cramping see hpi.  Skin: Negative for rash.  Neurological: Negative for dizziness, speech difficulty, weakness and headaches.  Hematological: Negative for adenopathy. Does not bruise/bleed easily.  Psychiatric/Behavioral: Negative for behavioral problems and decreased concentration.   Past Medical History:  Diagnosis Date  . Depression 01/27/2015  . Diabetes mellitus without complication (HCC)   . Hyperlipidemia   . Hypertension      Social History   Socioeconomic History  . Marital status: Single    Spouse name: Not on file  . Number of children: Not on file  . Years of education: Not on file  . Highest  education level: Not on file  Occupational History  . Not on file  Social Needs  . Financial resource strain: Not on file  . Food insecurity:    Worry: Not on file    Inability: Not on file  . Transportation needs:    Medical: Not on file    Non-medical: Not on file  Tobacco Use  . Smoking status: Current Every Day Smoker  . Smokeless tobacco: Never Used  Substance and Sexual Activity  . Alcohol use: No    Alcohol/week: 0.0 oz  . Drug use: No  . Sexual activity: Not on file  Lifestyle  . Physical activity:    Days per week: Not on file    Minutes per session: Not on file  . Stress: Not on file  Relationships  . Social connections:    Talks on phone: Not on file    Gets together: Not on file    Attends religious service: Not on file    Active member of club or organization: Not on file    Attends meetings of clubs or organizations: Not on file    Relationship status: Not on file  . Intimate partner violence:    Fear of current or ex partner: Not on file    Emotionally abused: Not on file    Physically abused: Not on file    Forced sexual activity: Not on file  Other Topics Concern  . Not on file  Social History  Narrative  . Not on file    Past Surgical History:  Procedure Laterality Date  . kidney stones  2009    No family history on file.  No Known Allergies  Current Outpatient Medications on File Prior to Visit  Medication Sig Dispense Refill  . ciclopirox (PENLAC) 8 % solution Apply topically at bedtime. Apply over nail and surrounding skin. Apply daily over previous coat. After seven (7) days, may remove with alcohol and continue cycle. 6.6 mL 0  . clarithromycin (BIAXIN) 500 MG tablet Take 1 tablet (500 mg total) 2 (two) times daily by mouth. 20 tablet 0  . fenofibrate (TRICOR) 48 MG tablet Take 1 tablet (48 mg total) by mouth daily. 30 tablet 3  . hydrochlorothiazide (HYDRODIURIL) 25 MG tablet TAKE ONE TABLET BY MOUTH DAILY FOR BREAKFAST 30 tablet 5  .  INSULIN DEGLUDEC-LIRAGLUTIDE [place] Inject into the skin.    Marland Kitchen liraglutide (VICTOZA) 18 MG/3ML SOPN 1.2 mg injection daily 5 pen 1  . lisinopril (PRINIVIL,ZESTRIL) 20 MG tablet TAKE ONE TABLET BY MOUTH DAILY 30 tablet 5  . meclizine (ANTIVERT) 25 MG tablet Take 1 tablet (25 mg total) by mouth 3 (three) times daily as needed for dizziness. 30 tablet 0  . metFORMIN (GLUCOPHAGE-XR) 500 MG 24 hr tablet TAKE TWO TABLETS BY MOUTH TWICE A DAY 120 tablet 3  . ondansetron (ZOFRAN ODT) 8 MG disintegrating tablet Take 1 tablet (8 mg total) by mouth every 8 (eight) hours as needed for nausea or vomiting. 20 tablet 0  . pravastatin (PRAVACHOL) 20 MG tablet Take 1 tablet (20 mg total) by mouth daily. 30 tablet 3  . traZODone (DESYREL) 50 MG tablet Take 0.5-1 tablets (25-50 mg total) by mouth at bedtime as needed for sleep. 30 tablet 3   No current facility-administered medications on file prior to visit.     BP 132/86   Pulse 68   Temp 98 F (36.7 C) (Oral)   Resp 16   Ht 6' (1.829 m)   Wt 216 lb 12.8 oz (98.3 kg)   SpO2 100%   BMI 29.40 kg/m       Objective:   Physical Exam  General Appearance- Not in acute distress.  HEENT Eyes- Scleraeral/Conjuntiva-bilat- Not Yellow. Mouth & Throat- Normal.  Chest and Lung Exam Auscultation: Breath sounds:-Normal. Adventitious sounds:- No Adventitious sounds.  Cardiovascular Auscultation:Rythm - Regular. Heart Sounds -Normal heart sounds.  Abdomen Inspection:-Inspection Normal.  Palpation/Perucssion: Palpation and Percussion of the abdomen reveal- Non Tender, No Rebound tenderness, No rigidity(Guarding) and No Palpable abdominal masses.  Liver:-Normal.  Spleen:- Normal.   Back- no cva tenderness.  Rt lower ext- no swelling of calfs. Negative homan sign. Good foot pulses. No pain on palpation. No warmth or induration of calf.      Assessment & Plan:  Pt friend phone number Kandace Parkins that always translates but not hear today.  [phone].   Your blood sugar average was 210 on last a1c on last visit. We were never able to contact you. I had wanted to increase your victoza to 1.8 mg daily but you report some low blood sugars. So after we repeat a1c tomorrow. Might have you decrease your metformin to 1 tab twice daily and check your blood sugars daily to make sure no blood sugars less than 90. But I will need to check a1c first then will call you friend Kandace Parkins who always translates for you. No changes presently.  We will also check your lipid panel tomorrow.  In addition  we will get metabolic panel to check your liver function and electrolytes.  We will include a magnesium level to work-up your right calf cramping.  You are taking a diuretic so it is possible that she might have some recent low potassium.  Follow-up date to be determined after lab review.  Please schedule your appointments in the future when Kandace Parkins can attend.

## 2017-08-27 ENCOUNTER — Other Ambulatory Visit (INDEPENDENT_AMBULATORY_CARE_PROVIDER_SITE_OTHER): Payer: No Typology Code available for payment source

## 2017-08-27 DIAGNOSIS — R252 Cramp and spasm: Secondary | ICD-10-CM

## 2017-08-27 DIAGNOSIS — E785 Hyperlipidemia, unspecified: Secondary | ICD-10-CM

## 2017-08-27 DIAGNOSIS — E119 Type 2 diabetes mellitus without complications: Secondary | ICD-10-CM

## 2017-08-27 LAB — COMPREHENSIVE METABOLIC PANEL
ALT: 24 U/L (ref 0–53)
AST: 12 U/L (ref 0–37)
Albumin: 4.2 g/dL (ref 3.5–5.2)
Alkaline Phosphatase: 85 U/L (ref 39–117)
BUN: 30 mg/dL — AB (ref 6–23)
CO2: 26 mEq/L (ref 19–32)
Calcium: 9.6 mg/dL (ref 8.4–10.5)
Chloride: 98 mEq/L (ref 96–112)
Creatinine, Ser: 1.35 mg/dL (ref 0.40–1.50)
GFR: 61.96 mL/min (ref >60.00)
Glucose, Bld: 259 mg/dL — ABNORMAL HIGH (ref 70–99)
POTASSIUM: 4.3 meq/L (ref 3.5–5.1)
SODIUM: 136 meq/L (ref 135–145)
TOTAL PROTEIN: 7.1 g/dL (ref 6.0–8.3)
Total Bilirubin: 0.4 mg/dL (ref 0.2–1.2)

## 2017-08-27 LAB — LIPID PANEL
CHOLESTEROL: 218 mg/dL — AB (ref 0–200)
HDL: 29.1 mg/dL — AB (ref >39.00)
NonHDL: 189.12
TRIGLYCERIDES: 272 mg/dL — AB (ref 0.0–149.0)
Total CHOL/HDL Ratio: 7
VLDL: 54.4 mg/dL — AB (ref 0.0–40.0)

## 2017-08-27 LAB — LDL CHOLESTEROL, DIRECT: Direct LDL: 142 mg/dL

## 2017-08-27 LAB — HEMOGLOBIN A1C: Hgb A1c MFr Bld: 9.7 % — ABNORMAL HIGH (ref 4.6–6.5)

## 2017-08-27 LAB — MAGNESIUM: MAGNESIUM: 1.5 mg/dL (ref 1.5–2.5)

## 2017-09-08 ENCOUNTER — Telehealth: Payer: Self-pay | Admitting: Medical

## 2017-09-08 MED ORDER — DAPAGLIFLOZIN PROPANEDIOL 5 MG PO TABS: 5.0000 mg | ORAL_TABLET | Freq: Every day | ORAL | 3 refills | Status: DC

## 2017-09-08 MED ORDER — PRAVASTATIN SODIUM 40 MG PO TABS: 40.0000 mg | ORAL_TABLET | Freq: Every day | ORAL | 3 refills | Status: DC

## 2017-09-08 NOTE — Telephone Encounter (Signed)
Rx farxiga sent to patient pharmacy.

## 2017-09-08 NOTE — Telephone Encounter (Signed)
Rx new dose fenofibrate.

## 2018-01-05 ENCOUNTER — Telehealth: Payer: Self-pay | Admitting: Medical

## 2018-01-05 MED ORDER — LIRAGLUTIDE 18 MG/3ML ~~LOC~~ SOPN: PEN_INJECTOR | SUBCUTANEOUS | 1 refills | Status: DC

## 2018-01-05 NOTE — Telephone Encounter (Signed)
Copied from CRM [id number]. Topic: Quick Communication - See Telephone Encounter >> Jan 05, 2018 11:54 AM Windy KalataMichael, Alcide Memoli L, NT wrote: CRM for notification. See Telephone encounter for: 01/05/18.  [name] is calling from the [facility] and is requesting a refill for the patient on liraglutide (VICTOZA) 18 MG/3ML SOPN.  [facility] [place] - [address] [place] [address] Phone: [phone] Fax: [phone]

## 2018-01-07 ENCOUNTER — Telehealth: Payer: Self-pay

## 2018-01-07 NOTE — Telephone Encounter (Signed)
Error

## 2018-01-07 NOTE — Telephone Encounter (Signed)
Pharmacy calling again, did not receive the rx. Author relayed vicotza rx as prescribed by [name] on 9/23 over the phone. No other needs at this time.

## 2018-01-30 ENCOUNTER — Other Ambulatory Visit: Payer: Self-pay | Admitting: Medical

## 2018-02-23 ENCOUNTER — Telehealth: Payer: Self-pay | Admitting: *Deleted

## 2018-02-23 NOTE — Telephone Encounter (Signed)
Received request for Medical Records from [facility] Disability Determination Services; forwarded to Medical Records via email/scan/SLS 11/11

## 2018-04-22 ENCOUNTER — Telehealth: Payer: Self-pay | Admitting: *Deleted

## 2018-04-22 NOTE — Telephone Encounter (Signed)
Received request for Medical Records from [place] Disability Determination Services; forwarded to Medical Records via email/scan/SLS  

## 2018-05-10 ENCOUNTER — Other Ambulatory Visit: Payer: Self-pay | Admitting: Medical

## 2018-05-11 NOTE — Telephone Encounter (Signed)
Pt due for follow up please call and schedule appointment.  

## 2018-07-19 ENCOUNTER — Other Ambulatory Visit: Payer: Self-pay | Admitting: Medical

## 2018-07-19 ENCOUNTER — Other Ambulatory Visit: Payer: Self-pay | Admitting: Family Medicine

## 2018-08-13 ENCOUNTER — Other Ambulatory Visit: Payer: Self-pay

## 2018-08-13 ENCOUNTER — Encounter: Payer: Self-pay | Admitting: Medical

## 2018-08-13 ENCOUNTER — Ambulatory Visit (INDEPENDENT_AMBULATORY_CARE_PROVIDER_SITE_OTHER): Payer: No Typology Code available for payment source | Admitting: Medical

## 2018-08-13 DIAGNOSIS — E119 Type 2 diabetes mellitus without complications: Secondary | ICD-10-CM

## 2018-08-13 DIAGNOSIS — I10 Essential (primary) hypertension: Secondary | ICD-10-CM

## 2018-08-13 DIAGNOSIS — E785 Hyperlipidemia, unspecified: Secondary | ICD-10-CM

## 2018-08-13 NOTE — Progress Notes (Signed)
Subjective:    Patient ID: Thomas [name], male    DOB: [birth year], 43 y.o.   MRN: [id number]  HPI  Virtual Visit via Video Note  I connected with Thomas [name] on 08/13/18 at  1:00 PM EDT by a video enabled telemedicine application and verified that I am speaking with the correct person using two identifiers.  Location: Patient: [employer].(Friend at his [employer] who speaks good English interpreted for [facility]) Provider: office   Pt has bp cuff at home but can't check since at shop.   I discussed the limitations of evaluation and management by telemedicine and the availability of in person appointments. The patient expressed understanding and agreed to proceed.   History of Present Illness:  About 1 year since last seen. Did not keep follow up.  Pt is diabetic. He is fasting sunrise to sunset. He has been checking his sugars and level 120-130. Sometimes up to 150. Fasting for Ramadan. Pt agrees will check if weak, dizzy etc. He has been doing ramadan for 7 days. He is on metformin.He is also on victoza.  He ran out of victoza this month. After breaking fast sugar will spike to 200.  Pt has high blood pressure. He does not check his blood pressure. No cardiac signs or symptoms   Pt has high cholesterol. He is on pravachol and fenofibrate.   Observations/Objective: No acute distress. Neuro-gross motor intact on inspection. Lungs-   Assessment and Plan: [phone]([name])  Patient has history of diabetes and has not been seen for about 1 year.  His sugars have been recently in the lower 100 range most of the days.  Occasionally his sugars will spike up to 200 but this is after eating.  He has been doing Ramadan and states that most of the time he feels fine and his sugars have been in very good range.  He does report yesterday he felt slight dizzy for about 5 minutes.  He sat down to rest and states that the symptoms resolved.  However he did not check his blood  sugar.  He is on metformin presently but recently ran out of the Victoza.  Advised patient that if he starts to feel dizzy, lightheaded or generally not well he should check his blood sugar.  Particularly if he decides to do a fasting for Ramadan.  Presently not going to prescribe Victoza as concerned that this might lead to low blood sugar events.  I do want him to get CMP, A1c and lipid panel tomorrow.  We will follow his A1c and see if he even needs to be on Victoza presently.  If his sugars are at a reasonable level then might not have him use of Victoza until the end of Ramadan.   Patient has high blood pressure but he has not been checking his blood pressure.  He does have a blood pressure cuff at home.  Asked him to check his blood pressure and when he is in for blood work tomorrow morning to let us know what his blood pressure reading was.  Presently he will stay on the same regimen for blood pressure.  He does have a history of high cholesterol and is on Zocor and fenofibrate.  He has current prescriptions that and will continue.  Will follow up on labs this weekend and inform him of the results likely on Monday.  Follow Up Instructions:    I discussed the assessment and treatment plan with the patient. The patient was provided an  opportunity to ask questions and all were answered. The patient agreed with the plan and demonstrated an understanding of the instructions.   The patient was advised to call back or seek an in-person evaluation if the symptoms worsen or if the condition fails to improve as anticipated.   25 minutes spent with patient.  50% of time spent counseling patient on plan going forward.  Particularly went into detail regarding diabetic plan in light of his fasting during Ramadan.  Explained possibility will prescribe Victoza presently but will be dependent on his sugar levels when he checks as well as A1c which is pending presently.   [name], PA-C     Review  of Systems  Constitutional: Negative for activity change, chills and fever.  HENT: Positive for congestion.   Respiratory: Negative for cough, chest tightness and shortness of breath.   Cardiovascular: Negative for chest pain, palpitations and leg swelling.  Gastrointestinal: Negative for abdominal pain, nausea and vomiting.  Endocrine: Negative for polyphagia and polyuria.  Musculoskeletal: Negative for neck pain and neck stiffness.  Neurological: Negative for dizziness, seizures, syncope, weakness and headaches.       Negative presently.  Brief dizziness yesterday.  Psychiatric/Behavioral: Negative for agitation, behavioral problems and confusion. The patient is not nervous/anxious.        Objective:   Physical Exam        Assessment & Plan:

## 2018-08-13 NOTE — Patient Instructions (Addendum)
Patient has history of diabetes and has not been seen for about 1 year.  His sugars have been recently in the lower 100 range most of the days.  Occasionally his sugars will spike up to 200 but this is after eating.  He has been doing Ramadan and states that most of the time he feels fine and his sugars have been in very good range.  He does report yesterday he felt slight dizzy for about 5 minutes.  He sat down to rest and states that the symptoms resolved.  However he did not check his blood sugar.  He is on metformin presently but recently ran out of the Victoza.  Advised patient that if he starts to feel dizzy, lightheaded or generally not well he should check his blood sugar.  Particularly if he decides to do a fasting for Ramadan.  Presently not going to prescribe Victoza as concerned that this might lead to low blood sugar events.  I do want him to get CMP, A1c and lipid panel tomorrow.  We will follow his A1c and see if he even needs to be on Victoza presently.  If his sugars are at a reasonable level then might not have him use of Victoza until the end of Ramadan.   Patient has high blood pressure but he has not been checking his blood pressure.  He does have a blood pressure cuff at home.  Asked him to check his blood pressure and when he is in for blood work tomorrow morning to let us know what his blood pressure reading was.  Presently he will stay on the same regimen for blood pressure.  He does have a history of high cholesterol and is on Zocor and fenofibrate.  He has current prescriptions that and will continue.  Will follow up on labs this weekend and inform him of the results likely on Monday.

## 2018-08-14 ENCOUNTER — Other Ambulatory Visit (INDEPENDENT_AMBULATORY_CARE_PROVIDER_SITE_OTHER): Payer: No Typology Code available for payment source

## 2018-08-14 ENCOUNTER — Other Ambulatory Visit: Payer: Self-pay

## 2018-08-14 DIAGNOSIS — E119 Type 2 diabetes mellitus without complications: Secondary | ICD-10-CM

## 2018-08-14 DIAGNOSIS — E785 Hyperlipidemia, unspecified: Secondary | ICD-10-CM

## 2018-08-14 DIAGNOSIS — I10 Essential (primary) hypertension: Secondary | ICD-10-CM

## 2018-08-14 LAB — LIPID PANEL
Cholesterol: 267 mg/dL — ABNORMAL HIGH (ref 0–200)
HDL: 37 mg/dL — ABNORMAL LOW (ref >39.00)
Total CHOL/HDL Ratio: 7
Triglycerides: 597 mg/dL — ABNORMAL HIGH (ref 0.0–149.0)

## 2018-08-14 LAB — COMPREHENSIVE METABOLIC PANEL
ALT: 38 U/L (ref 0–53)
AST: 20 U/L (ref 0–37)
Albumin: 4 g/dL (ref 3.5–5.2)
Alkaline Phosphatase: 112 U/L (ref 39–117)
BUN: 22 mg/dL (ref 6–23)
CO2: 30 mEq/L (ref 19–32)
Calcium: 9.6 mg/dL (ref 8.4–10.5)
Chloride: 94 mEq/L — ABNORMAL LOW (ref 96–112)
Creatinine, Ser: 1.01 mg/dL (ref 0.40–1.50)
GFR: 81.09 mL/min (ref >60.00)
Glucose, Bld: 310 mg/dL — ABNORMAL HIGH (ref 70–99)
Potassium: 4.4 mEq/L (ref 3.5–5.1)
Sodium: 134 mEq/L — ABNORMAL LOW (ref 135–145)
Total Bilirubin: 0.4 mg/dL (ref 0.2–1.2)
Total Protein: 7 g/dL (ref 6.0–8.3)

## 2018-08-14 LAB — HEMOGLOBIN A1C: Hgb A1c MFr Bld: 15.8 % — ABNORMAL HIGH (ref 4.6–6.5)

## 2018-08-14 LAB — LDL CHOLESTEROL, DIRECT: Direct LDL: 118 mg/dL

## 2018-08-17 ENCOUNTER — Telehealth: Payer: Self-pay | Admitting: Medical

## 2018-08-17 MED ORDER — LIRAGLUTIDE 18 MG/3ML ~~LOC~~ SOPN: PEN_INJECTOR | SUBCUTANEOUS | 1 refills | Status: DC

## 2018-08-17 NOTE — Telephone Encounter (Signed)
Sent in victoza to pt pharmacy.

## 2018-08-22 ENCOUNTER — Other Ambulatory Visit: Payer: Self-pay | Admitting: Medical

## 2018-09-08 ENCOUNTER — Other Ambulatory Visit: Payer: Self-pay | Admitting: Medical

## 2018-09-08 MED ORDER — LIRAGLUTIDE 18 MG/3ML ~~LOC~~ SOPN: PEN_INJECTOR | SUBCUTANEOUS | 1 refills | Status: DC

## 2018-09-08 NOTE — Telephone Encounter (Signed)
Copied from CRM [id number]. Topic: Quick Communication - Rx Refill/Question >> Sep 08, 2018 11:49 AM Jaquita Rector A wrote: Medication: liraglutide (VICTOZA) 18 MG/3ML SOPN  Per patient he is out last Rx was [facility] to [facility] please resend  Has the patient contacted their pharmacy? Yes.   (Agent: If no, request that the patient contact the pharmacy for the refill.) (Agent: If yes, when and what did the pharmacy advise?)  Preferred Pharmacy (with phone number or street name): [facility] - [place], [place] - [address] [phone] (Phone) [phone] (Fax)    Agent: Please be advised that RX refills may take up to 3 business days. We ask that you follow-up with your pharmacy.

## 2018-09-08 NOTE — Telephone Encounter (Signed)
Rx sent to [facility].

## 2018-09-21 ENCOUNTER — Other Ambulatory Visit: Payer: Self-pay | Admitting: Medical

## 2018-10-20 ENCOUNTER — Other Ambulatory Visit: Payer: Self-pay | Admitting: Medical

## 2018-11-05 ENCOUNTER — Other Ambulatory Visit: Payer: Self-pay | Admitting: Medical

## 2018-11-05 MED ORDER — LIRAGLUTIDE 18 MG/3ML ~~LOC~~ SOPN: PEN_INJECTOR | SUBCUTANEOUS | 1 refills | Status: DC

## 2018-11-05 NOTE — Telephone Encounter (Signed)
liraglutide (VICTOZA) 53 MG/3ML [place], [place] [phone] (Phone) [phone] (Fax)   pls refill

## 2018-11-05 NOTE — Telephone Encounter (Signed)
Refills sent

## 2018-11-13 ENCOUNTER — Ambulatory Visit: Payer: No Typology Code available for payment source | Admitting: Medical

## 2018-11-18 ENCOUNTER — Other Ambulatory Visit: Payer: Self-pay | Admitting: Medical

## 2018-11-23 ENCOUNTER — Encounter: Payer: Self-pay | Admitting: Medical

## 2018-11-23 ENCOUNTER — Ambulatory Visit (INDEPENDENT_AMBULATORY_CARE_PROVIDER_SITE_OTHER): Payer: No Typology Code available for payment source | Admitting: Medical

## 2018-11-23 ENCOUNTER — Other Ambulatory Visit: Payer: Self-pay

## 2018-11-23 VITALS — BP 119/80 | HR 111 | Temp 98.3°F | Resp 16 | Ht 72.0 in | Wt 218.6 lb

## 2018-11-23 DIAGNOSIS — E785 Hyperlipidemia, unspecified: Secondary | ICD-10-CM

## 2018-11-23 DIAGNOSIS — E119 Type 2 diabetes mellitus without complications: Secondary | ICD-10-CM

## 2018-11-23 DIAGNOSIS — E0842 Diabetes mellitus due to underlying condition with diabetic polyneuropathy: Secondary | ICD-10-CM

## 2018-11-23 DIAGNOSIS — R252 Cramp and spasm: Secondary | ICD-10-CM

## 2018-11-23 MED ORDER — GABAPENTIN 100 MG PO CAPS: 100.0000 mg | ORAL_CAPSULE | Freq: Every day | ORAL | 0 refills | Status: DC

## 2018-11-23 NOTE — Progress Notes (Signed)
Subjective:    Patient ID: Thomas [name], male    DOB: 1975-06-19, 43 y.o.   MRN: [id number]  HPI  Pt in for follow up. Has been 3 months since last visit.  His a1c was 15.8. Pt is on victoza 1.8 mg injection and metformin. Pt admits he does not follow diet. He states has been to diabetic education before in past and can't follow the diet.  He also has numbness and tingling to his feet. His feet feel cold at time. States some cramping at times. 7-8 months of tingling and cramping. Short distance like from office to elevator.     Review of Systems  Constitutional: Negative for chills and fatigue.  HENT: Negative for congestion and ear discharge.   Respiratory: Negative for cough, chest tightness, shortness of breath and wheezing.   Cardiovascular: Negative for chest pain and palpitations.  Gastrointestinal: Negative for abdominal pain.  Musculoskeletal: Negative for back pain and joint swelling.  Skin: Negative for rash.  Neurological: Negative for facial asymmetry, speech difficulty and weakness.       Numbness to feet. Worse right side with cramping.  Hematological: Negative for adenopathy. Does not bruise/bleed easily.  Psychiatric/Behavioral: Negative for behavioral problems, confusion and sleep disturbance. The patient is not nervous/anxious.    Past Medical History:  Diagnosis Date  . Depression 01/27/2015  . Diabetes mellitus without complication (HCC)   . Hyperlipidemia   . Hypertension      Social History   Socioeconomic History  . Marital status: Single    Spouse name: Not on file  . Number of children: Not on file  . Years of education: Not on file  . Highest education level: Not on file  Occupational History  . Not on file  Social Needs  . Financial resource strain: Not on file  . Food insecurity    Worry: Not on file    Inability: Not on file  . Transportation needs    Medical: Not on file    Non-medical: Not on file  Tobacco Use  . Smoking status:  Current Every Day Smoker  . Smokeless tobacco: Never Used  Substance and Sexual Activity  . Alcohol use: No    Alcohol/week: 0.0 standard drinks  . Drug use: No  . Sexual activity: Not on file  Lifestyle  . Physical activity    Days per week: Not on file    Minutes per session: Not on file  . Stress: Not on file  Relationships  . Social Musicianconnections    Talks on phone: Not on file    Gets together: Not on file    Attends religious service: Not on file    Active member of club or organization: Not on file    Attends meetings of clubs or organizations: Not on file    Relationship status: Not on file  . Intimate partner violence    Fear of current or ex partner: Not on file    Emotionally abused: Not on file    Physically abused: Not on file    Forced sexual activity: Not on file  Other Topics Concern  . Not on file  Social History Narrative  . Not on file    Past Surgical History:  Procedure Laterality Date  . kidney stones  2009    No family history on file.  No Known Allergies  Current Outpatient Medications on File Prior to Visit  Medication Sig Dispense Refill  . ciclopirox (PENLAC) 8 % solution Apply  topically at bedtime. Apply over nail and surrounding skin. Apply daily over previous coat. After seven (7) days, may remove with alcohol and continue cycle. 6.6 mL 0  . clarithromycin (BIAXIN) 500 MG tablet Take 1 tablet (500 mg total) 2 (two) times daily by mouth. 20 tablet 0  . dapagliflozin propanediol (FARXIGA) 5 MG TABS tablet Take 5 mg by mouth daily. 30 tablet 3  . fenofibrate (TRICOR) 48 MG tablet Take 1 tablet (48 mg total) by mouth daily. 30 tablet 3  . hydrochlorothiazide (HYDRODIURIL) 25 MG tablet TAKE ONE TABLET BY MOUTH DAILY 30 tablet 0  . INSULIN DEGLUDEC-LIRAGLUTIDE [place] Inject into the skin.    Marland Kitchen. liraglutide (VICTOZA) 18 MG/3ML SOPN 1.8 mg injection daily(please explain titration to max dose) 15 mL 1  . lisinopril (ZESTRIL) 20 MG tablet TAKE ONE TABLET  BY MOUTH DAILY 30 tablet 0  . meclizine (ANTIVERT) 25 MG tablet Take 1 tablet (25 mg total) by mouth 3 (three) times daily as needed for dizziness. 30 tablet 0  . metFORMIN (GLUCOPHAGE-XR) 500 MG 24 hr tablet TAKE TWO TABLETS BY MOUTH TWICE A DAY 120 tablet 0  . omeprazole (PRILOSEC) 20 MG capsule Take 1 capsule (20 mg total) by mouth daily. 30 capsule 3  . ondansetron (ZOFRAN ODT) 8 MG disintegrating tablet Take 1 tablet (8 mg total) by mouth every 8 (eight) hours as needed for nausea or vomiting. 20 tablet 0  . pravastatin (PRAVACHOL) 40 MG tablet Take 1 tablet (40 mg total) by mouth daily. 30 tablet 3  . traZODone (DESYREL) 50 MG tablet Take 0.5-1 tablets (25-50 mg total) by mouth at bedtime as needed for sleep. 30 tablet 3   No current facility-administered medications on file prior to visit.     BP 119/80   Pulse (!) 111   Temp 98.3 F (36.8 C) (Oral)   Resp 16   Ht 6' (1.829 m)   Wt 218 lb 9.6 oz (99.2 kg)   SpO2 99%   BMI 29.65 kg/m       Objective:   Physical Exam  General Mental Status- Alert. General Appearance- Not in acute distress.   Skin General: Color- Normal Color. Moisture- Normal Moisture.  Neck Carotid Arteries- Normal color. Moisture- Normal Moisture. No carotid bruits. No JVD.  Chest and Lung Exam Auscultation: Breath Sounds:-Normal.  Cardiovascular Auscultation:Rythm- Regular. Murmurs & Other Heart Sounds:Auscultation of the heart reveals- No Murmurs.  Abdomen Inspection:-Inspeection Normal. Palpation/Percussion:Note:No mass. Palpation and Percussion of the abdomen reveal- Non Tender, Non Distended + BS, no rebound or guarding.    Neurologic Cranial Nerve exam:- CN III-XII intact(No nystagmus), symmetric smile. Strength:- 5/5 equal and symmetric strength both upper and lower extremities.  Bilateral feet- both sides negative monfilament. Pulses feel intact. No lesison.      Assessment & Plan:  [phone](yasser). [phone](majeed).   For diabetes, did place future order metabolic panel and A1c to get done hopefully tomorrow morning fasting with lipid panel as well.  If A1c still very high as last time then we will probably write him Lantus insulin and DC Victoza but keep him on the metformin.  We will discuss in detail with his friend who speaks English if having start insulin.  For right lower extremity cramping, did go ahead and place order for ABI.  Will follow results and might need to refer him to vascular specialist.  For diabetic neuropathy, I will go ahead and prescribe him gabapentin.  Will discuss in detail with friend.   For  high cholesterol, will review fasting labs and consider putting patient on atorvastatin.  Counseled patient on importance of diet and complications with diabetes.  Follow-up date to be determined after lab review.   Mackie Pai, PA-C

## 2018-11-23 NOTE — Patient Instructions (Addendum)
For diabetes, did place future order metabolic panel and G8Q to get done hopefully tomorrow morning fasting with lipid panel as well.  If A1c still very high as last time then we will probably write him Lantus insulin and DC Victoza but keep him on the metformin.  We will discuss in detail with his friend who speaks English if having start insulin.  For right lower extremity cramping, did go ahead and place order for ABI.  Will follow results and might need to refer him to vascular specialist.  For diabetic neuropathy, I will go ahead and prescribe him gabapentin.  Will discuss in detail with friend.   For high cholesterol, will review fasting labs and consider putting patient on atorvastatin.  Counseled patient on importance of diet and complications with diabetes.  Follow-up date to be determined after lab review.

## 2018-11-26 ENCOUNTER — Other Ambulatory Visit (INDEPENDENT_AMBULATORY_CARE_PROVIDER_SITE_OTHER): Payer: No Typology Code available for payment source

## 2018-11-26 ENCOUNTER — Other Ambulatory Visit: Payer: Self-pay

## 2018-11-26 DIAGNOSIS — E119 Type 2 diabetes mellitus without complications: Secondary | ICD-10-CM

## 2018-11-26 DIAGNOSIS — E785 Hyperlipidemia, unspecified: Secondary | ICD-10-CM

## 2018-11-26 DIAGNOSIS — R252 Cramp and spasm: Secondary | ICD-10-CM

## 2018-11-26 LAB — LDL CHOLESTEROL, DIRECT: Direct LDL: 108 mg/dL

## 2018-11-26 LAB — LIPID PANEL
Cholesterol: 218 mg/dL — ABNORMAL HIGH (ref 0–200)
HDL: 32.5 mg/dL — ABNORMAL LOW (ref >39.00)
Total CHOL/HDL Ratio: 7
Triglycerides: 461 mg/dL — ABNORMAL HIGH (ref 0.0–149.0)

## 2018-11-26 LAB — COMPREHENSIVE METABOLIC PANEL
ALT: 30 U/L (ref 0–53)
AST: 15 U/L (ref 0–37)
Albumin: 3.9 g/dL (ref 3.5–5.2)
Alkaline Phosphatase: 91 U/L (ref 39–117)
BUN: 17 mg/dL (ref 6–23)
CO2: 28 mEq/L (ref 19–32)
Calcium: 9.2 mg/dL (ref 8.4–10.5)
Chloride: 99 mEq/L (ref 96–112)
Creatinine, Ser: 1.17 mg/dL (ref 0.40–1.50)
GFR: 68.34 mL/min (ref >60.00)
Glucose, Bld: 328 mg/dL — ABNORMAL HIGH (ref 70–99)
Potassium: 4.4 mEq/L (ref 3.5–5.1)
Sodium: 134 mEq/L — ABNORMAL LOW (ref 135–145)
Total Bilirubin: 0.3 mg/dL (ref 0.2–1.2)
Total Protein: 6.5 g/dL (ref 6.0–8.3)

## 2018-11-26 LAB — HEMOGLOBIN A1C: Hgb A1c MFr Bld: 15.2 % — ABNORMAL HIGH (ref 4.6–6.5)

## 2018-11-27 ENCOUNTER — Telehealth: Payer: Self-pay | Admitting: Medical

## 2018-11-27 ENCOUNTER — Ambulatory Visit (HOSPITAL_BASED_OUTPATIENT_CLINIC_OR_DEPARTMENT_OTHER)
Admission: RE | Admit: 2018-11-27 | Discharge: 2018-11-27 | Disposition: A | Discharge status: Home / Self Care | Payer: Self-pay | Source: Ambulatory Visit | Attending: Medical | Admitting: Medical

## 2018-11-27 DIAGNOSIS — E0842 Diabetes mellitus due to underlying condition with diabetic polyneuropathy: Secondary | ICD-10-CM | POA: Insufficient documentation

## 2018-11-27 DIAGNOSIS — E785 Hyperlipidemia, unspecified: Secondary | ICD-10-CM | POA: Insufficient documentation

## 2018-11-27 DIAGNOSIS — E119 Type 2 diabetes mellitus without complications: Secondary | ICD-10-CM | POA: Insufficient documentation

## 2018-11-27 DIAGNOSIS — R252 Cramp and spasm: Secondary | ICD-10-CM | POA: Insufficient documentation

## 2018-11-27 LAB — MICROALBUMIN, URINE: Microalb, Ur: 27 mg/dL

## 2018-11-27 MED ORDER — LANTUS SOLOSTAR 100 UNIT/ML ~~LOC~~ SOPN: PEN_INJECTOR | SUBCUTANEOUS | 99 refills | Status: DC

## 2018-11-27 NOTE — Telephone Encounter (Signed)
Will call Thomas [name] who comes with pt on each visit and explain labs as well as explain sending in lantus and [place].   Sent rx of lantus to pt pharmacy.

## 2018-11-27 NOTE — Progress Notes (Signed)
ABI Doppler performed     11/27/18 Thomas [name]

## 2018-11-27 NOTE — Progress Notes (Signed)
  Echocardiogram 2D Echocardiogram has been performed.  Thomas [name] 11/27/2018, 11:38 AM

## 2018-12-09 ENCOUNTER — Telehealth: Payer: Self-pay

## 2018-12-09 NOTE — Telephone Encounter (Signed)
Pt called- needing to know how much Lantus to take- per med list he is to inject 10 units qhs. Pt verbalized understanding.

## 2018-12-23 ENCOUNTER — Other Ambulatory Visit: Payer: Self-pay | Admitting: Medical

## 2019-01-10 ENCOUNTER — Telehealth: Payer: Self-pay | Admitting: Medical

## 2019-01-10 NOTE — Telephone Encounter (Signed)
Opened to review 

## 2019-01-10 NOTE — Telephone Encounter (Signed)
Please try to call and schedule to discuss how to use lantus. HD states he is using too frequently and running out. They are helping him with samples.

## 2019-01-27 ENCOUNTER — Other Ambulatory Visit: Payer: Self-pay | Admitting: Medical

## 2019-02-03 ENCOUNTER — Telehealth: Payer: Self-pay | Admitting: Medical

## 2019-02-03 NOTE — Telephone Encounter (Signed)
Called to confirmed appt for 02-04-2019 and Mr Katharine Look ( pt's friend who is on [place]) informed that pt is in the Hospital since Sunday 01-31-2019 with blood sugar very high (600). Pt's friend wanted provider to know.

## 2019-02-03 NOTE — Telephone Encounter (Signed)
Should be 40 minute appointment

## 2019-02-04 ENCOUNTER — Ambulatory Visit: Payer: Self-pay | Admitting: Medical

## 2019-03-19 ENCOUNTER — Ambulatory Visit: Payer: Self-pay | Admitting: *Deleted

## 2019-03-19 ENCOUNTER — Ambulatory Visit: Payer: Self-pay | Admitting: Medical

## 2019-03-19 ENCOUNTER — Ambulatory Visit (HOSPITAL_BASED_OUTPATIENT_CLINIC_OR_DEPARTMENT_OTHER)
Admission: RE | Admit: 2019-03-19 | Discharge: 2019-03-19 | Disposition: A | Discharge status: Home / Self Care | Payer: Medicaid Other | Source: Ambulatory Visit | Attending: Medical | Admitting: Medical

## 2019-03-19 ENCOUNTER — Other Ambulatory Visit: Payer: Self-pay

## 2019-03-19 ENCOUNTER — Encounter: Payer: Self-pay | Admitting: Medical

## 2019-03-19 VITALS — BP 98/65 | HR 110 | Temp 95.9°F | Resp 16

## 2019-03-19 DIAGNOSIS — Z8619 Personal history of other infectious and parasitic diseases: Secondary | ICD-10-CM

## 2019-03-19 DIAGNOSIS — E0842 Diabetes mellitus due to underlying condition with diabetic polyneuropathy: Secondary | ICD-10-CM

## 2019-03-19 DIAGNOSIS — I10 Essential (primary) hypertension: Secondary | ICD-10-CM

## 2019-03-19 DIAGNOSIS — M79674 Pain in right toe(s): Secondary | ICD-10-CM

## 2019-03-19 DIAGNOSIS — E119 Type 2 diabetes mellitus without complications: Secondary | ICD-10-CM

## 2019-03-19 DIAGNOSIS — S91109A Unspecified open wound of unspecified toe(s) without damage to nail, initial encounter: Secondary | ICD-10-CM

## 2019-03-19 DIAGNOSIS — M79671 Pain in right foot: Secondary | ICD-10-CM | POA: Insufficient documentation

## 2019-03-19 DIAGNOSIS — M869 Osteomyelitis, unspecified: Secondary | ICD-10-CM

## 2019-03-19 LAB — COMPREHENSIVE METABOLIC PANEL
ALT: 13 U/L (ref 0–53)
AST: 11 U/L (ref 0–37)
Albumin: 3.7 g/dL (ref 3.5–5.2)
Alkaline Phosphatase: 95 U/L (ref 39–117)
BUN: 18 mg/dL (ref 6–23)
CO2: 26 mEq/L (ref 19–32)
Calcium: 9.8 mg/dL (ref 8.4–10.5)
Chloride: 96 mEq/L (ref 96–112)
Creatinine, Ser: 0.94 mg/dL (ref 0.40–1.50)
GFR: 87.85 mL/min (ref >60.00)
Glucose, Bld: 226 mg/dL — ABNORMAL HIGH (ref 70–99)
Potassium: 4.2 mEq/L (ref 3.5–5.1)
Sodium: 133 mEq/L — ABNORMAL LOW (ref 135–145)
Total Bilirubin: 0.5 mg/dL (ref 0.2–1.2)
Total Protein: 8.7 g/dL — ABNORMAL HIGH (ref 6.0–8.3)

## 2019-03-19 LAB — CBC WITH DIFFERENTIAL/PLATELET
Basophils Absolute: 0.1 10*3/uL (ref 0.0–0.1)
Basophils Relative: 0.4 % (ref 0.0–3.0)
Eosinophils Absolute: 0.1 10*3/uL (ref 0.0–0.7)
Eosinophils Relative: 0.7 % (ref 0.0–5.0)
HCT: 31.1 % — ABNORMAL LOW (ref 39.0–52.0)
Hemoglobin: 9.9 g/dL — ABNORMAL LOW (ref 13.0–17.0)
Lymphocytes Relative: 18.9 % (ref 12.0–46.0)
Lymphs Abs: 2.2 10*3/uL (ref 0.7–4.0)
MCHC: 31.9 g/dL (ref 30.0–36.0)
MCV: 78.8 fl (ref 78.0–100.0)
Monocytes Absolute: 0.6 10*3/uL (ref 0.1–1.0)
Monocytes Relative: 5.3 % (ref 3.0–12.0)
Neutro Abs: 8.8 10*3/uL — ABNORMAL HIGH (ref 1.4–7.7)
Neutrophils Relative %: 74.7 % (ref 43.0–77.0)
Platelets: 689 10*3/uL — ABNORMAL HIGH (ref 150.0–400.0)
RBC: 3.95 Mil/uL — ABNORMAL LOW (ref 4.22–5.81)
RDW: 15.5 % (ref 11.5–15.5)
WBC: 11.8 10*3/uL — ABNORMAL HIGH (ref 4.0–10.5)

## 2019-03-19 IMAGING — CR DG FOOT COMPLETE 3+V*R*
3 series · 3 of 3 positions shown · non-contrast
Comparison: None.

CLINICAL DATA: Peripheral/black appearance distal second toe.
Evaluate for underlying osteomyelitis.

EXAM:
RIGHT FOOT COMPLETE - 3+ VIEW

[t foot ap right]
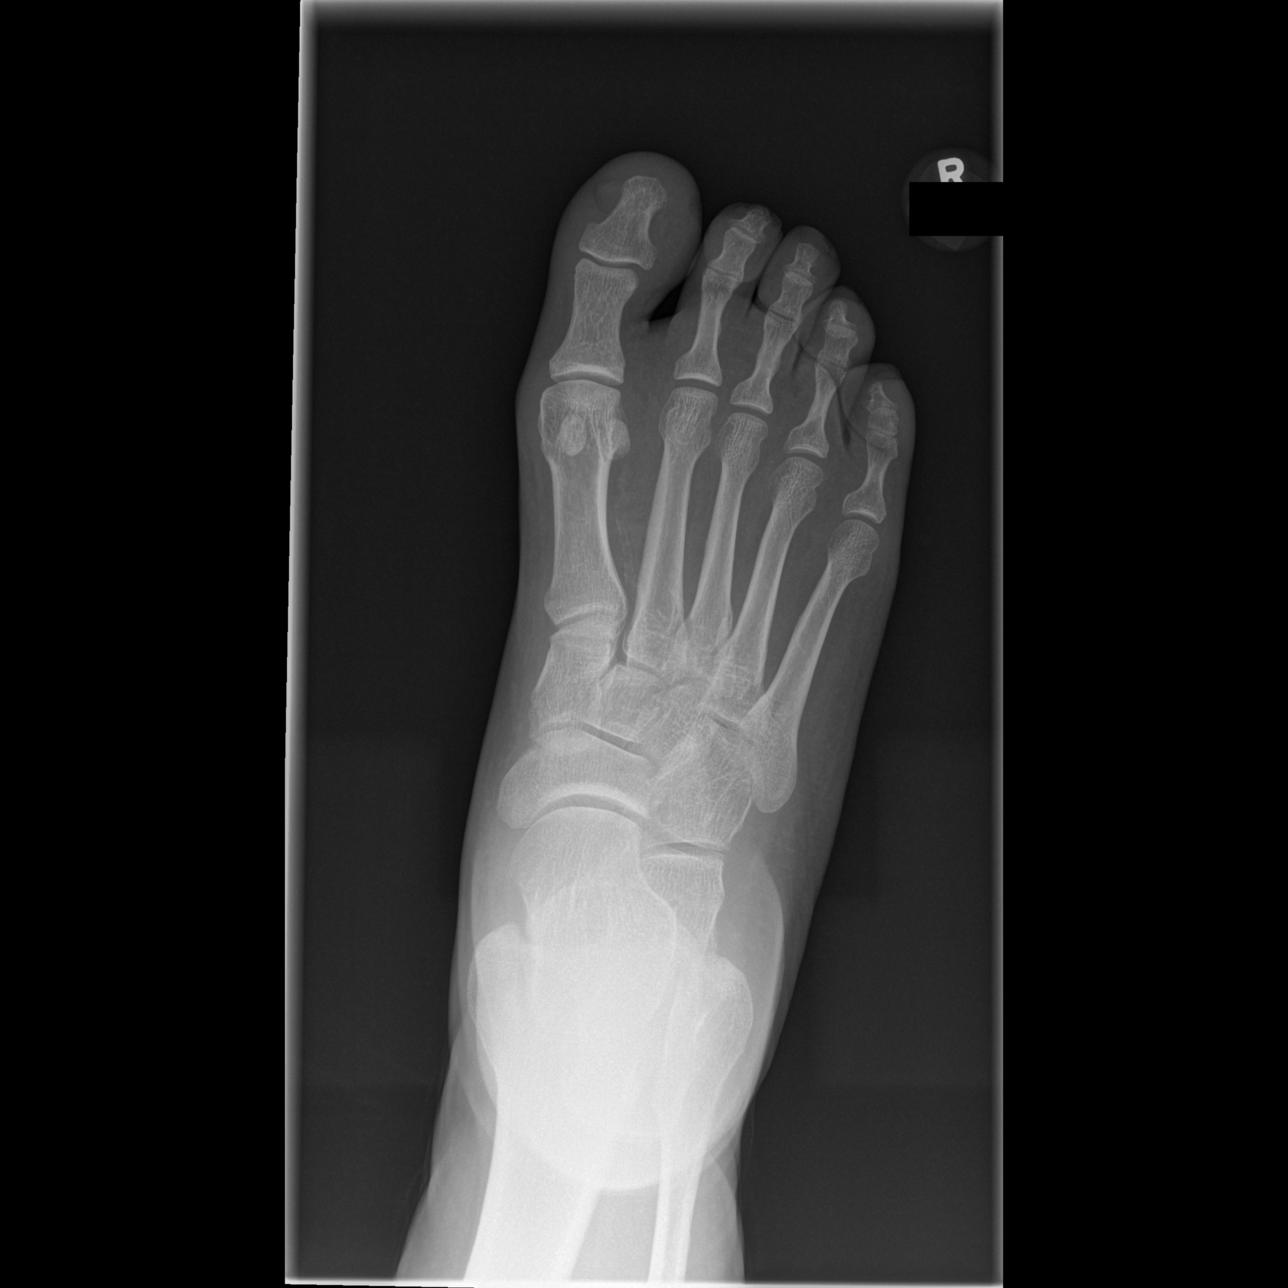

[t foot oblique right]
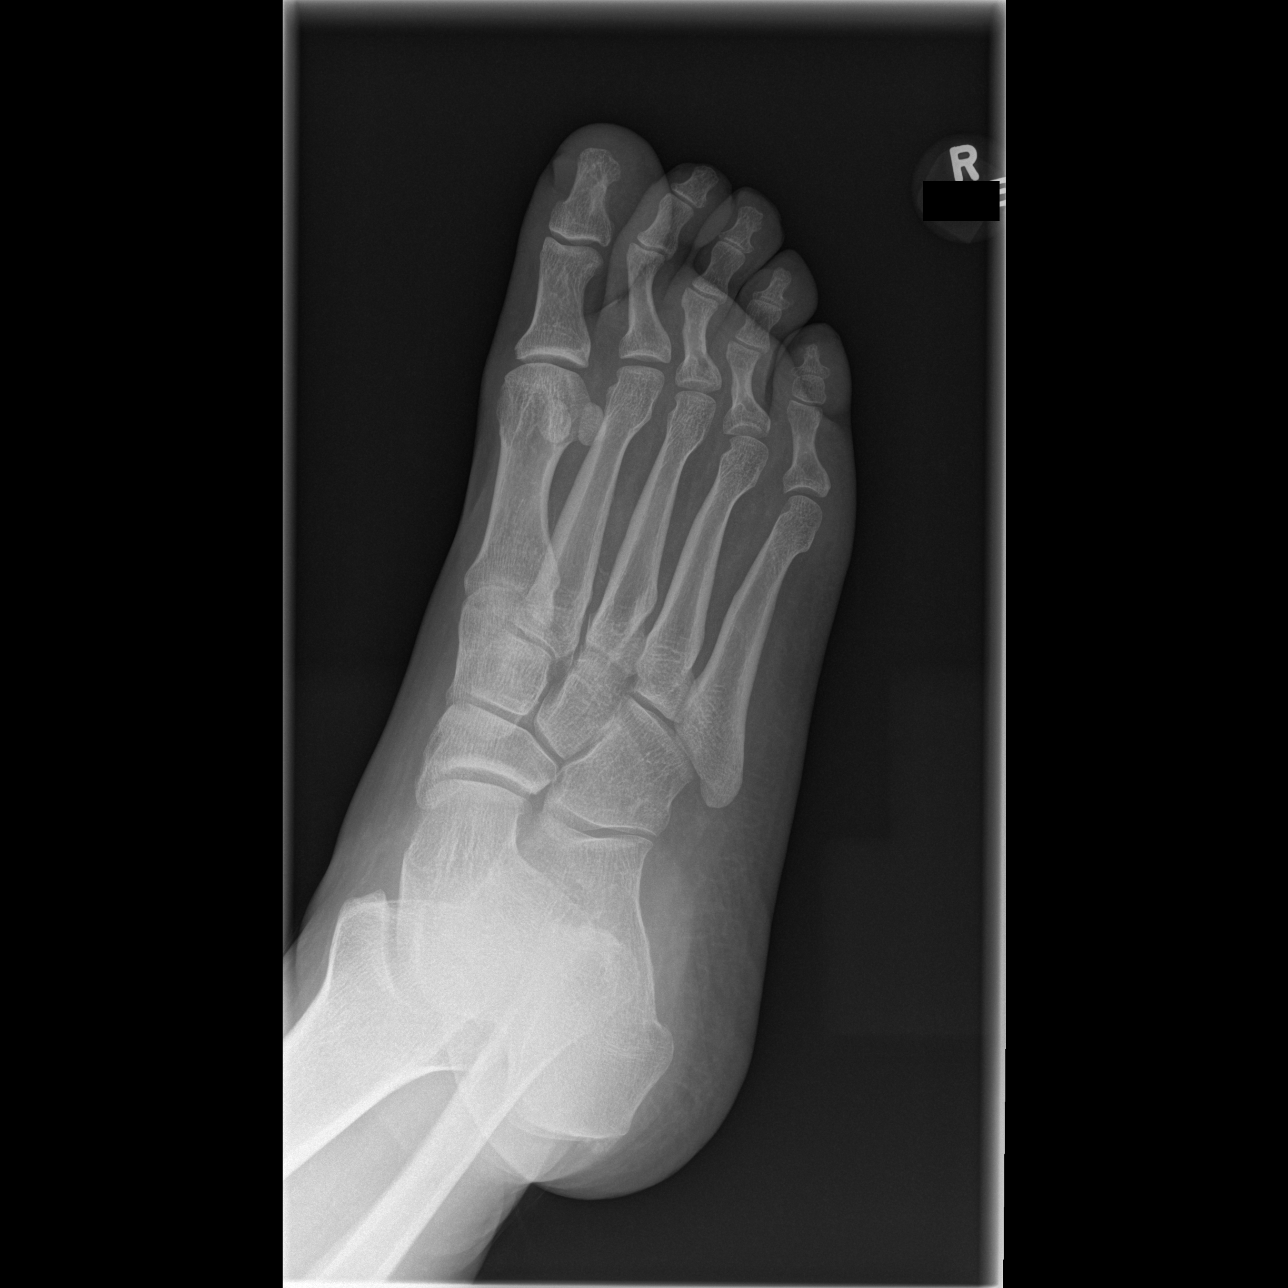

[t foot lat right]
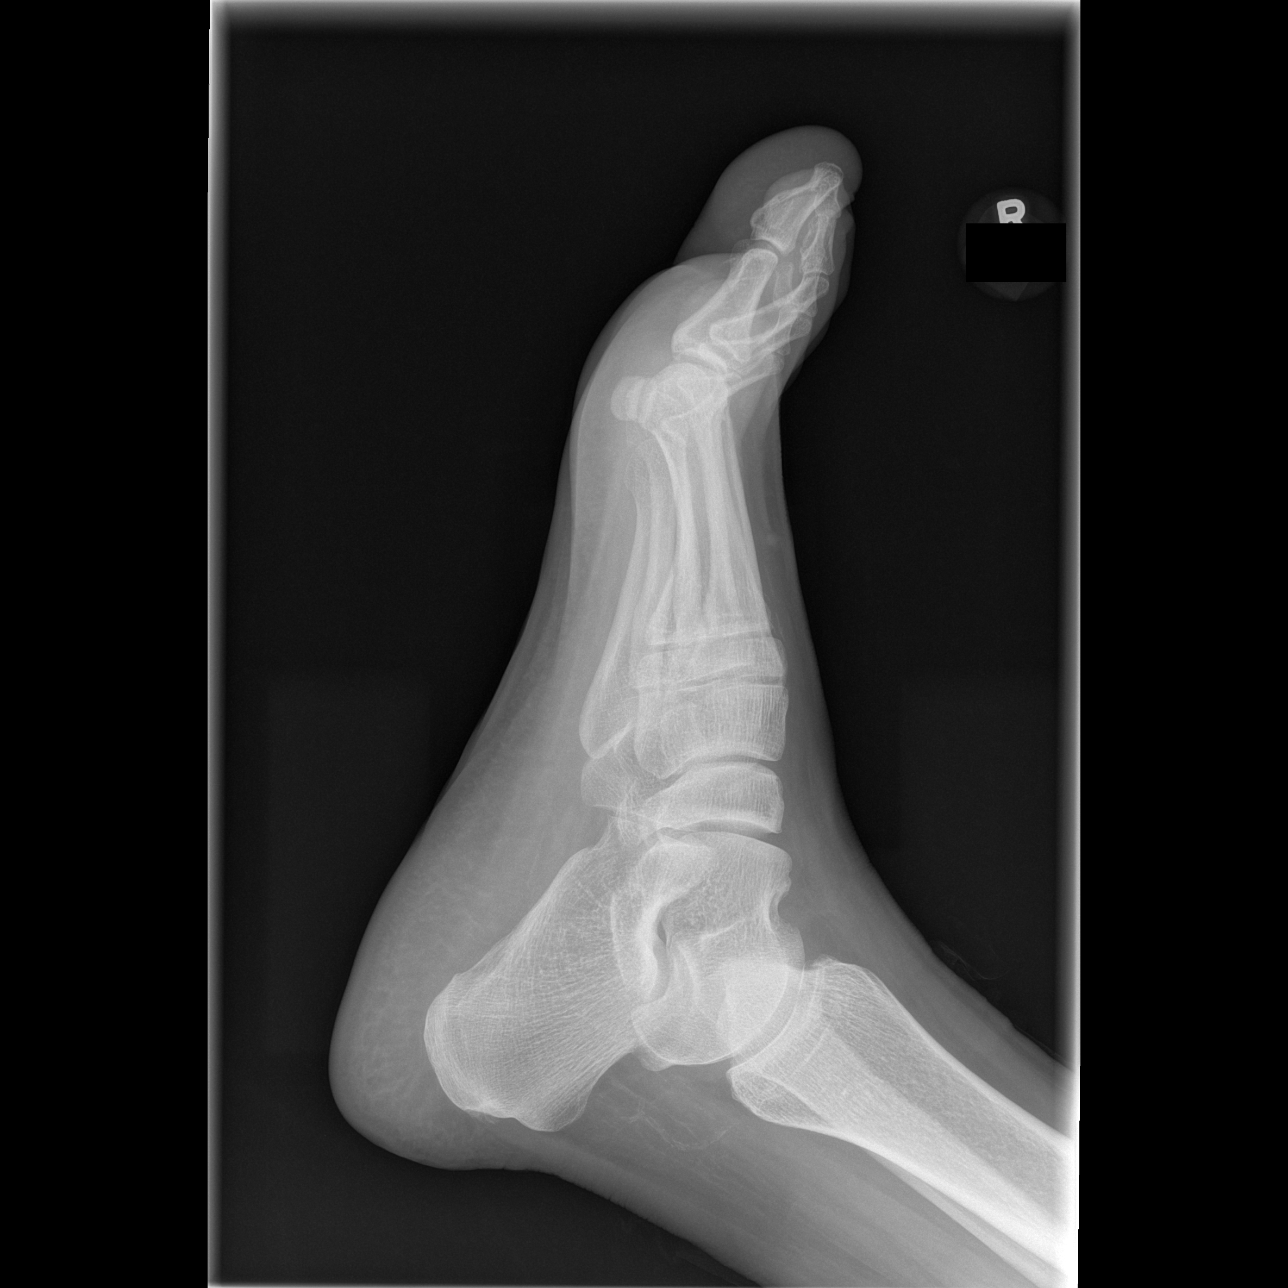

[3 of 3 positions shown; findings below may reference images not displayed]

FINDINGS: Examination demonstrates no evidence of bone destruction to suggest
osteomyelitis. There is no evidence of air within the soft tissues.
Remaining bony structures and joint spaces are normal.
IMPRESSION: No acute findings and no evidence of osteomyelitis.

## 2019-03-19 NOTE — Telephone Encounter (Signed)
Message from Alease Frame sent at 03/19/2019 4:40 PM EST  Thomas [name] called from [facility] patient is currently in office and they need to know where should they take him . Please call    Patient notified of xray results per notes of Edward,Pa on 03/19/19 at 1653.

## 2019-03-19 NOTE — Patient Instructions (Addendum)
For diabetes historically uncontrolled continue with lantus 30 units at night. But increase by one units each night if fasting sugar in am above 100. Can stop titrating when sugars 100.  For osteomyelitis continue with daily IV antibiotic.  For new onset rt toe wounds need to see podiatrist. Thankfully podiatrist at [facility] was able to see.   For htn continue current ace inhibitor.  Get cbc and cmp today.  Follow up next Thursday.     This was the above plan but when pt got to [facility] they were told they don't take orange card.  So he came back to our office. We called various other podiatrist but could not get him in. Not sure if was due to Friday or if offices not accepting orange card.  I got xray of foot which did not show osteomyelitis of the foot/toe. But still dilema that I have concern of soft tissue appearance. So advised get iv antibiotic treatment tomorrow with [facility]  and show foot to staff. But then after iv antibiotic tomorrow got to ED at same facility. I have concern with his recent hx and diabetes that eventually may develop osteomyelits in foot. Or concern may develop nonviable soft tissue due to infection. So he might need readmission?

## 2019-03-19 NOTE — Progress Notes (Signed)
Subjective:    Patient ID: Thomas [name], male    DOB: 1976/01/09, 43 y.o.   MRN: [id number]  HPI Pt  Was hospitalized for about one month per friend who is interpreting for him.   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 Pt had surgeries on lower extremity. The spine osteomyelitis.   Pt is getting iv antibiotics.   He also has some foot wounds. Discolored. Tissue.   Pt has diabetes. He has 30 units lantus at night. Pt sugars since   03/16/2019 200 fasting then second reading 299 after dinner. 03/17/2019 108 fasting. 136  After dinner. 03/18/2019 121 fasting. 135 after dinner. 03/19/2019 152 fasting.   Pt had blister appearance about 2 days ago to distal part of 2nd toe. Blister drained. Then within last 24 hours dark purple appearance to distal tip below nail to tip.   Pt can feel his toe. No odor or discharge.     Review of Systems  Constitutional: Negative for chills, fatigue and fever.  Respiratory: Negative for cough, chest tightness, shortness of breath and wheezing.   Cardiovascular: Negative for chest pain and palpitations.  Musculoskeletal: Negative for back pain, myalgias and neck pain.       See hpi  Skin: Negative for rash.  Neurological: Negative for dizziness, facial asymmetry, weakness, light-headedness and numbness.  Hematological: Negative for adenopathy. Does not bruise/bleed easily.  Psychiatric/Behavioral: Negative for behavioral problems, confusion, hallucinations, sleep disturbance and suicidal ideas. The patient is not nervous/anxious.     Past Medical History:  Diagnosis Date  . Depression 01/27/2015  . Diabetes mellitus without complication (HCC)   . Hyperlipidemia   . Hypertension      Social History   Socioeconomic History  . Marital status: Single    Spouse name: Not on file  . Number of children: Not on file  . Years of education: Not on file  . Highest education level: Not on file  Occupational History   . Not on file  Social Needs  . Financial resource strain: Not on file  . Food insecurity    Worry: Not on file    Inability: Not on file  . Transportation needs    Medical: Not on file    Non-medical: Not on file  Tobacco Use  . Smoking status: Current Every Day Smoker  . Smokeless tobacco: Never Used  Substance and Sexual Activity  . Alcohol use: No    Alcohol/week: 0.0 standard drinks  . Drug use: No  . Sexual activity: Not on file  Lifestyle  . Physical activity    Days per week: Not on file    Minutes per session: Not on file  . Stress: Not on file  Relationships  . Social Musicianconnections    Talks on phone: Not on file    Gets together: Not on file    Attends religious service: Not on file    Active member of club or organization: Not on file    Attends meetings of clubs or organizations: Not on file    Relationship status: Not on file  . Intimate partner violence    Fear of current or ex partner: Not on file    Emotionally abused: Not on file    Physically abused: Not on file    Forced sexual activity: Not on file  Other Topics Concern  . Not on file  Social History Narrative  . Not on file    Past Surgical  History:  Procedure Laterality Date  . kidney stones  2009    No family history on file.  No Known Allergies  Current Outpatient Medications on File Prior to Visit  Medication Sig Dispense Refill  . apixaban (ELIQUIS) 5 MG TABS tablet Take 5 mg by mouth.    . hydrochlorothiazide (HYDRODIURIL) 25 MG tablet TAKE ONE TABLET BY MOUTH DAILY 30 tablet 0  . Insulin Glargine (LANTUS SOLOSTAR) 100 UNIT/ML Solostar Pen 10 units q hs 5 pen PRN  . lisinopril (ZESTRIL) 20 MG tablet TAKE ONE TABLET BY MOUTH DAILY 30 tablet 0  . lovastatin (MEVACOR) 40 MG tablet Take 40 mg by mouth.    . metFORMIN (GLUCOPHAGE-XR) 500 MG 24 hr tablet TAKE TWO TABLETS BY MOUTH TWICE A DAY 120 tablet 0  . ciclopirox (PENLAC) 8 % solution Apply topically at bedtime. Apply over nail and  surrounding skin. Apply daily over previous coat. After seven (7) days, may remove with alcohol and continue cycle. 6.6 mL 0  . clarithromycin (BIAXIN) 500 MG tablet Take 1 tablet (500 mg total) 2 (two) times daily by mouth. 20 tablet 0  . dapagliflozin propanediol (FARXIGA) 5 MG TABS tablet Take 5 mg by mouth daily. 30 tablet 3  . fenofibrate (TRICOR) 48 MG tablet Take 1 tablet (48 mg total) by mouth daily. 30 tablet 3  . gabapentin (NEURONTIN) 100 MG capsule TAKE ONE CAPSULE BY MOUTH AT BEDTIME 30 capsule 0  . meclizine (ANTIVERT) 25 MG tablet Take 1 tablet (25 mg total) by mouth 3 (three) times daily as needed for dizziness. 30 tablet 0  . omeprazole (PRILOSEC) 20 MG capsule Take 1 capsule (20 mg total) by mouth daily. 30 capsule 3  . ondansetron (ZOFRAN ODT) 8 MG disintegrating tablet Take 1 tablet (8 mg total) by mouth every 8 (eight) hours as needed for nausea or vomiting. 20 tablet 0  . pravastatin (PRAVACHOL) 40 MG tablet Take 1 tablet (40 mg total) by mouth daily. (Patient not taking: Reported on 03/19/2019) 30 tablet 3  . traZODone (DESYREL) 50 MG tablet Take 0.5-1 tablets (25-50 mg total) by mouth at bedtime as needed for sleep. 30 tablet 3   No current facility-administered medications on file prior to visit.     BP 98/65   Pulse (!) 110   Temp (!) 95.9 F (35.5 C) (Temporal)   Resp 16   SpO2 100%       Objective:   Physical Exam  General- No acute distress. Pleasant patient. Neck- Full range of motion, no jvd Lungs- Clear, even and unlabored. Heart- regular rate and rhythm. Neurologic- CNII- XII grossly intact.  Rt lower ext- calf is wrapped.   Rt lower ext- removed banadage place by friend. 2nd toe distal aspect from above nail to tip dark purple appearance. No dc. Sensation intact. No foul smell. Rt great toe-medial aspect bruise. 3rd toe small scab present.      Assessment & Plan:  For diabetes historically uncontrolled continue with lantus 30 units at night.  But increase by one units each night if fasting sugar in am above 100. Can stop titrating when sugars 100.  For osteomyelitis continue with daily IV antibiotic.  For new onset rt toe wounds need to see podiatrist. Thankfully podiatrist at [facility] was able to see.   For htn continue current ace inhibitor.  Get cbc and cmp today.  Follow up next Thursday.   40 minutes spent with pt today. 50% of time spent counseling on plan going  forward.   [name], PA-C

## 2019-03-22 ENCOUNTER — Telehealth: Payer: Self-pay

## 2019-03-22 NOTE — Telephone Encounter (Signed)
Copied from [place] [id number]. Topic: Quick Communication - See Telephone Encounter >> Mar 19, 2019  4:35 PM Blase Mess A wrote: CRM for notification. See Telephone encounter for: 03/19/19.  Xray at [facility] are calling with x ray results for the patient. Call Dropped Unable to get X ray back on the phone. Thanks

## 2019-03-22 NOTE — Telephone Encounter (Signed)
Patient notified patient of results.

## 2019-03-23 ENCOUNTER — Ambulatory Visit: Payer: Self-pay | Admitting: Medical

## 2019-03-23 ENCOUNTER — Other Ambulatory Visit: Payer: Self-pay

## 2019-03-23 VITALS — BP 92/65 | HR 124 | Temp 96.5°F | Resp 12 | Ht 72.0 in | Wt 194.4 lb

## 2019-03-23 DIAGNOSIS — Z8619 Personal history of other infectious and parasitic diseases: Secondary | ICD-10-CM

## 2019-03-23 DIAGNOSIS — E119 Type 2 diabetes mellitus without complications: Secondary | ICD-10-CM

## 2019-03-23 DIAGNOSIS — M545 Low back pain, unspecified: Secondary | ICD-10-CM

## 2019-03-23 DIAGNOSIS — M869 Osteomyelitis, unspecified: Secondary | ICD-10-CM

## 2019-03-23 DIAGNOSIS — I10 Essential (primary) hypertension: Secondary | ICD-10-CM

## 2019-03-23 DIAGNOSIS — M25562 Pain in left knee: Secondary | ICD-10-CM

## 2019-03-23 DIAGNOSIS — I824Y9 Acute embolism and thrombosis of unspecified deep veins of unspecified proximal lower extremity: Secondary | ICD-10-CM

## 2019-03-23 DIAGNOSIS — S91109A Unspecified open wound of unspecified toe(s) without damage to nail, initial encounter: Secondary | ICD-10-CM

## 2019-03-23 NOTE — Progress Notes (Signed)
Subjective:    Patient ID: Thomas [name], male    DOB: November 29, 1975, 43 y.o.   MRN: [id number]  HPI  Pt rt foot feels ok per pt. No dc. No worrisome changes. I was not able to get patient in with podiatrist. Pt did got to ED on saturday and they reassured him area on his foot area ok. They gave him information to specialist that might see his foot.  Pt got iv antibiotics on Saturday. This continues and he  will need for total of 6 weeks.  On review of medication list he is on xarelto. Appears had dvt in hospital. I was not able to see this on care everywhere last times despite attempted in depth review of his chart.  Pt was given methadone by the hospital.   He is diabetic and  his sugars was 146 today. Pt is on Lantus.Marland Kitchen He is on 31 units at night.   Some left knee pain since surgery to left femur/thigh area. Pt had gait change since surgeries to lower ext for osteomyelitis.   Has some numbness and tingling to his fingers. This happened after surgery in hospital.    Review of Systems  Constitutional: Negative for chills, fatigue and fever.  HENT: Negative for congestion and drooling.   Respiratory: Negative for cough, chest tightness and shortness of breath.   Cardiovascular: Negative for chest pain and palpitations.  Gastrointestinal: Negative for abdominal pain, blood in stool, diarrhea, nausea and vomiting.  Musculoskeletal: Negative for back pain, myalgias and neck stiffness.  Skin: Negative for pallor and rash.  Neurological: Negative for dizziness, speech difficulty, weakness, numbness and headaches.  Hematological: Negative for adenopathy. Does not bruise/bleed easily.  Psychiatric/Behavioral: Negative for behavioral problems, confusion, dysphoric mood and suicidal ideas. The patient is not nervous/anxious.     Past Medical History:  Diagnosis Date  . Depression 01/27/2015  . Diabetes mellitus without complication ([place])   . Hyperlipidemia   . Hypertension       Social History   Socioeconomic History  . Marital status: Single    Spouse name: Not on file  . Number of children: Not on file  . Years of education: Not on file  . Highest education level: Not on file  Occupational History  . Not on file  Social Needs  . Financial resource strain: Not on file  . Food insecurity    Worry: Not on file    Inability: Not on file  . Transportation needs    Medical: Not on file    Non-medical: Not on file  Tobacco Use  . Smoking status: Current Every Day Smoker  . Smokeless tobacco: Never Used  Substance and Sexual Activity  . Alcohol use: No    Alcohol/week: 0.0 standard drinks  . Drug use: No  . Sexual activity: Not on file  Lifestyle  . Physical activity    Days per week: Not on file    Minutes per session: Not on file  . Stress: Not on file  Relationships  . Social Herbalist on phone: Not on file    Gets together: Not on file    Attends religious service: Not on file    Active member of club or organization: Not on file    Attends meetings of clubs or organizations: Not on file    Relationship status: Not on file  . Intimate partner violence    Fear of current or ex partner: Not on file  Emotionally abused: Not on file    Physically abused: Not on file    Forced sexual activity: Not on file  Other Topics Concern  . Not on file  Social History Narrative  . Not on file    Past Surgical History:  Procedure Laterality Date  . kidney stones  2009    No family history on file.  No Known Allergies  Current Outpatient Medications on File Prior to Visit  Medication Sig Dispense Refill  . apixaban (ELIQUIS) 5 MG TABS tablet Take 5 mg by mouth.    . gabapentin (NEURONTIN) 100 MG capsule TAKE ONE CAPSULE BY MOUTH AT BEDTIME 30 capsule 0  . hydrochlorothiazide (HYDRODIURIL) 25 MG tablet TAKE ONE TABLET BY MOUTH DAILY 30 tablet 0  . Insulin Glargine (LANTUS SOLOSTAR) 100 UNIT/ML Solostar Pen 10 units q hs (Patient  taking differently: Inject 30 Units into the skin daily. 10 units q hs) 5 pen PRN  . lisinopril (ZESTRIL) 20 MG tablet TAKE ONE TABLET BY MOUTH DAILY 30 tablet 0  . lovastatin (MEVACOR) 40 MG tablet Take 40 mg by mouth.    . metFORMIN (GLUCOPHAGE-XR) 500 MG 24 hr tablet TAKE TWO TABLETS BY MOUTH TWICE A DAY 120 tablet 0   No current facility-administered medications on file prior to visit.     BP 92/65 (BP Location: Left Arm, Cuff Size: Normal)   Pulse (!) 124   Temp (!) 96.5 F (35.8 C) (Temporal)   Resp 12   Ht 6' (1.829 m)   Wt 194 lb 6.4 oz (88.2 kg)   SpO2 100%   BMI 26.37 kg/m       Objective:   Physical Exam  General- No acute distress. Pleasant patient. Neck- Full range of motion, no jvd Lungs- Clear, even and unlabored. Heart- regular rate and rhythm. Neurologic- CNII- XII grossly intact.        Assessment & Plan:  [phone] (Husam) Majeed [phone]  For diabetes- continue insulin as we discussed.  For osteomyelitis- continue iv antibiotics.  For foot wound will refer to wound care or orthopedist.  For back pain can continue methadone.  For possible dvt rt lower ext, I want to get [place] of lower ext. Want to confirm since xarelto limits choice of medications for pain and inflammation.  For knee pain rt knee xray.  For left hand tingling can use daily.(wrist cock up splint)  Might rx nsaids but need clarification if has dvt.  Follow up in 10 days.  40 minutes spent with pt. 50% of time spent with pt couseling on plan going forward. [name], PA-C

## 2019-03-23 NOTE — Patient Instructions (Signed)
For diabetes- continue insulin as we discussed.  For osteomyelitis- continue iv antibiotics.  For foot wound will refer to wound care or orthopedist.  For back pain can continue methadone.  For possible dvt rt lower ext, I want to get [place] of lower ext. Want to confirm since xarelto limits choice of medications for pain and inflammation.  For knee pain rt knee xray.  For left hand tingling can use daily.(wrist cock up splint)  Might rx nsaids but need clarification if has dvt.  Follow up in 10 days.

## 2019-03-25 ENCOUNTER — Other Ambulatory Visit: Payer: Self-pay

## 2019-03-25 ENCOUNTER — Ambulatory Visit (HOSPITAL_BASED_OUTPATIENT_CLINIC_OR_DEPARTMENT_OTHER)
Admission: RE | Admit: 2019-03-25 | Discharge: 2019-03-25 | Disposition: A | Discharge status: Home / Self Care | Payer: Medicaid Other | Source: Ambulatory Visit | Attending: Medical | Admitting: Medical

## 2019-03-25 ENCOUNTER — Telehealth: Payer: Self-pay

## 2019-03-25 ENCOUNTER — Emergency Department (HOSPITAL_BASED_OUTPATIENT_CLINIC_OR_DEPARTMENT_OTHER)
Admission: EM | Admit: 2019-03-25 | Discharge: 2019-03-25 | Disposition: A | Discharge status: Home / Self Care | Payer: Medicaid Other | Attending: Emergency Medicine | Admitting: Emergency Medicine

## 2019-03-25 DIAGNOSIS — I825Y1 Chronic embolism and thrombosis of unspecified deep veins of right proximal lower extremity: Secondary | ICD-10-CM

## 2019-03-25 DIAGNOSIS — M25562 Pain in left knee: Secondary | ICD-10-CM | POA: Insufficient documentation

## 2019-03-25 DIAGNOSIS — M79605 Pain in left leg: Secondary | ICD-10-CM | POA: Diagnosis present

## 2019-03-25 DIAGNOSIS — F17210 Nicotine dependence, cigarettes, uncomplicated: Secondary | ICD-10-CM | POA: Insufficient documentation

## 2019-03-25 DIAGNOSIS — I10 Essential (primary) hypertension: Secondary | ICD-10-CM | POA: Diagnosis not present

## 2019-03-25 DIAGNOSIS — I824Y9 Acute embolism and thrombosis of unspecified deep veins of unspecified proximal lower extremity: Secondary | ICD-10-CM | POA: Insufficient documentation

## 2019-03-25 DIAGNOSIS — I82591 Chronic embolism and thrombosis of other specified deep vein of right lower extremity: Secondary | ICD-10-CM | POA: Insufficient documentation

## 2019-03-25 DIAGNOSIS — E785 Hyperlipidemia, unspecified: Secondary | ICD-10-CM | POA: Insufficient documentation

## 2019-03-25 DIAGNOSIS — G8929 Other chronic pain: Secondary | ICD-10-CM

## 2019-03-25 DIAGNOSIS — E119 Type 2 diabetes mellitus without complications: Secondary | ICD-10-CM | POA: Insufficient documentation

## 2019-03-25 IMAGING — DX DG KNEE 3 VIEWS*L*
3 series · 3 of 3 positions shown · non-contrast
Comparison: None.

CLINICAL DATA: Anterior left knee pain, status post recent knee
surgery, assess for osteomyelitis.

EXAM:
LEFT KNEE - 3 VIEW

[knee ap]
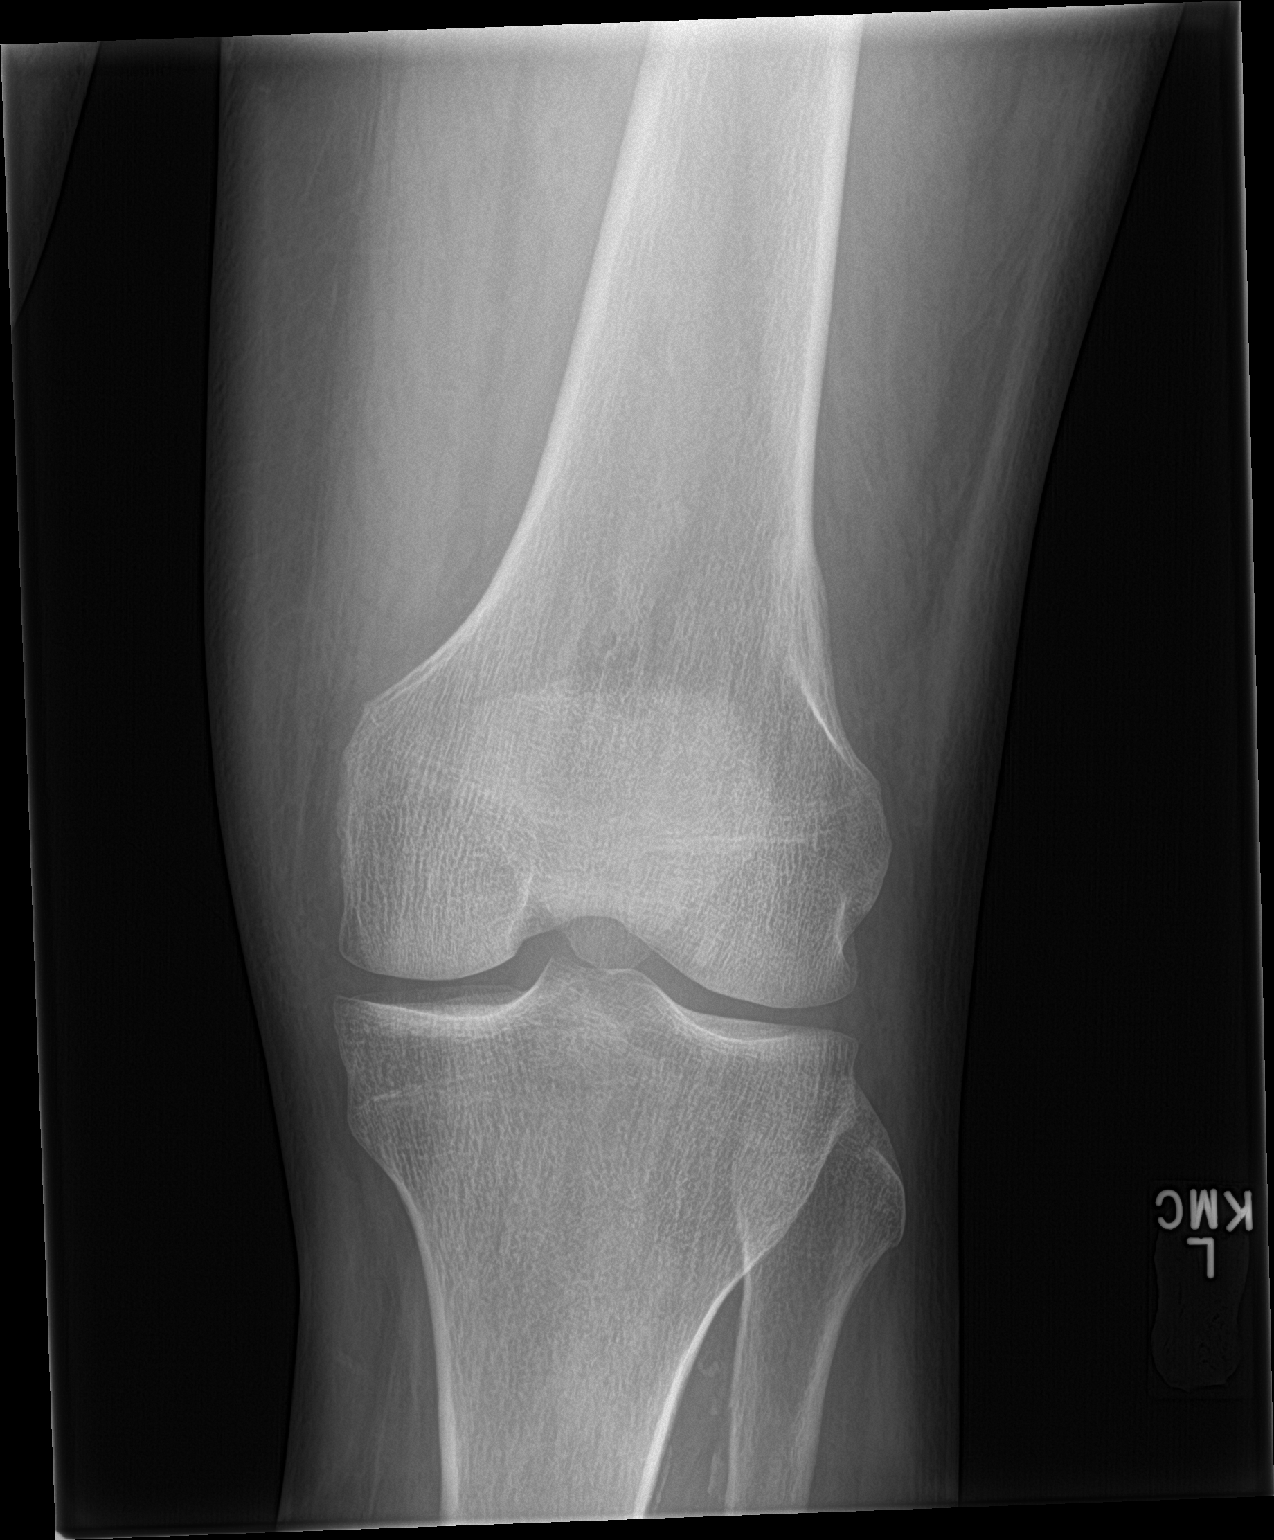

[knee lat]
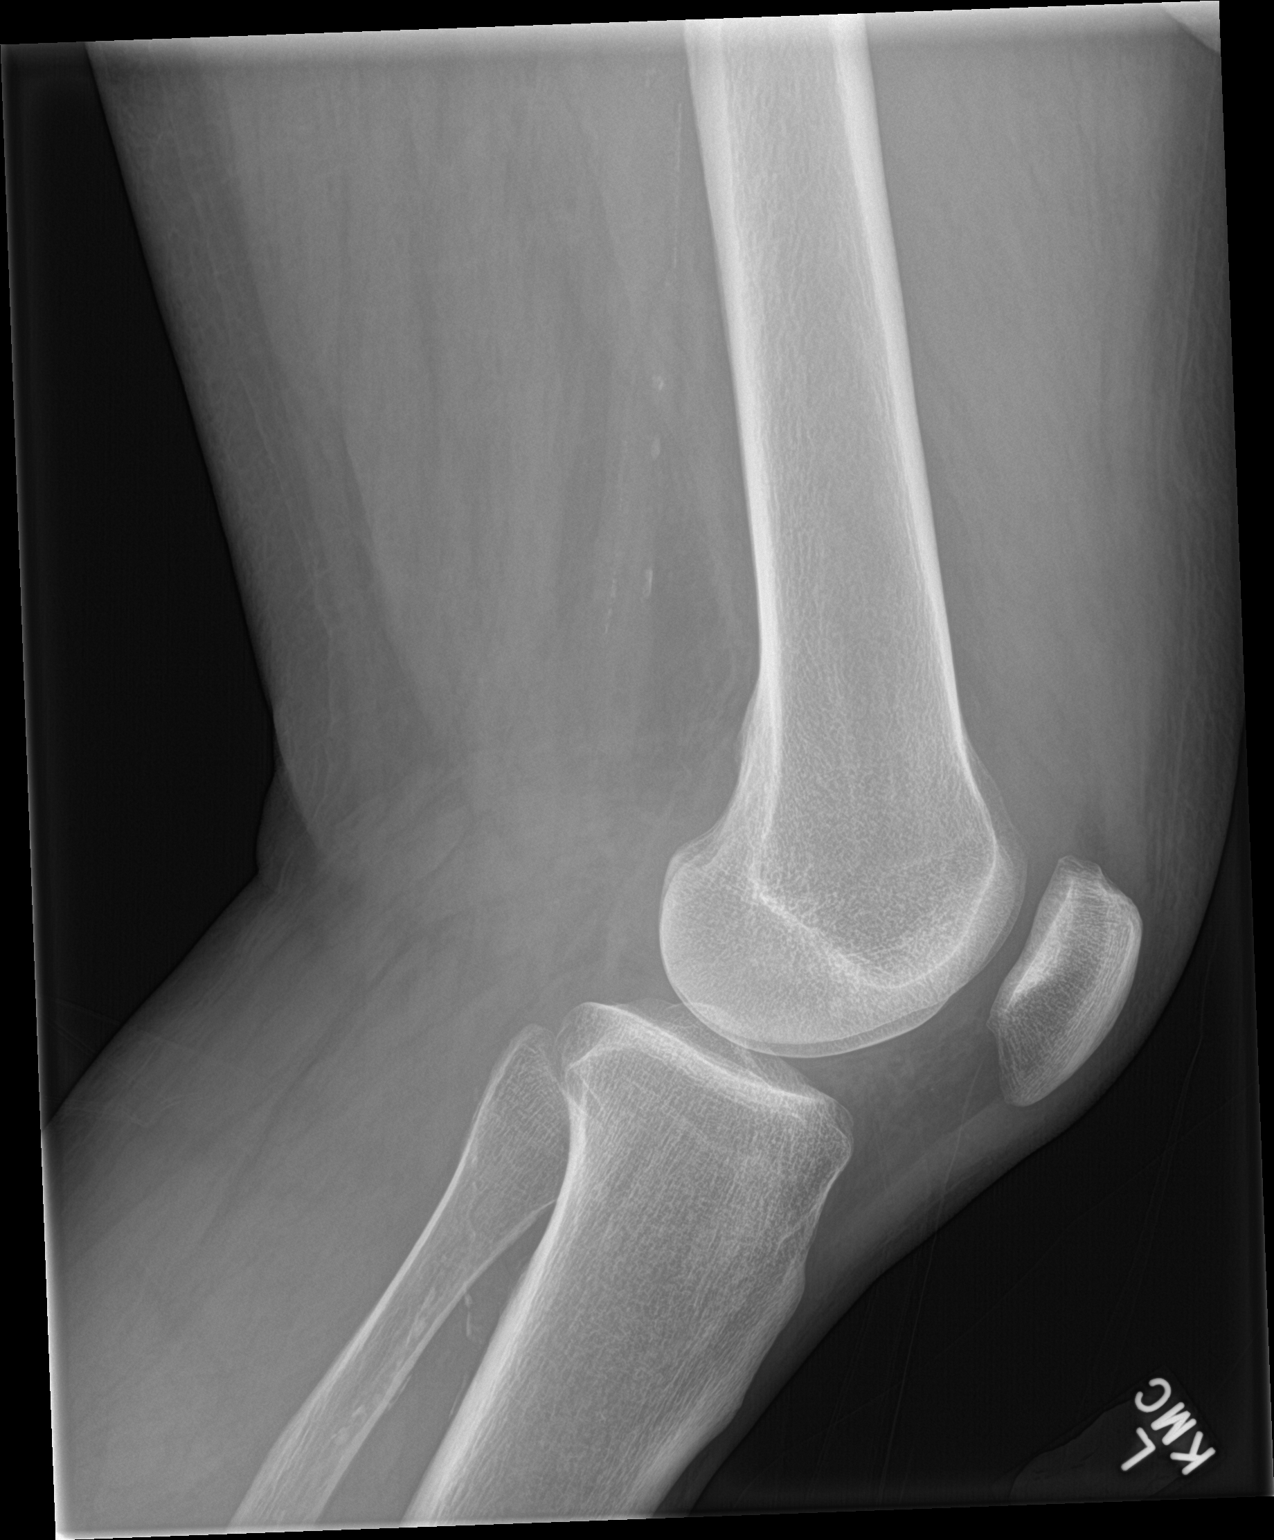

[knee sunrise]
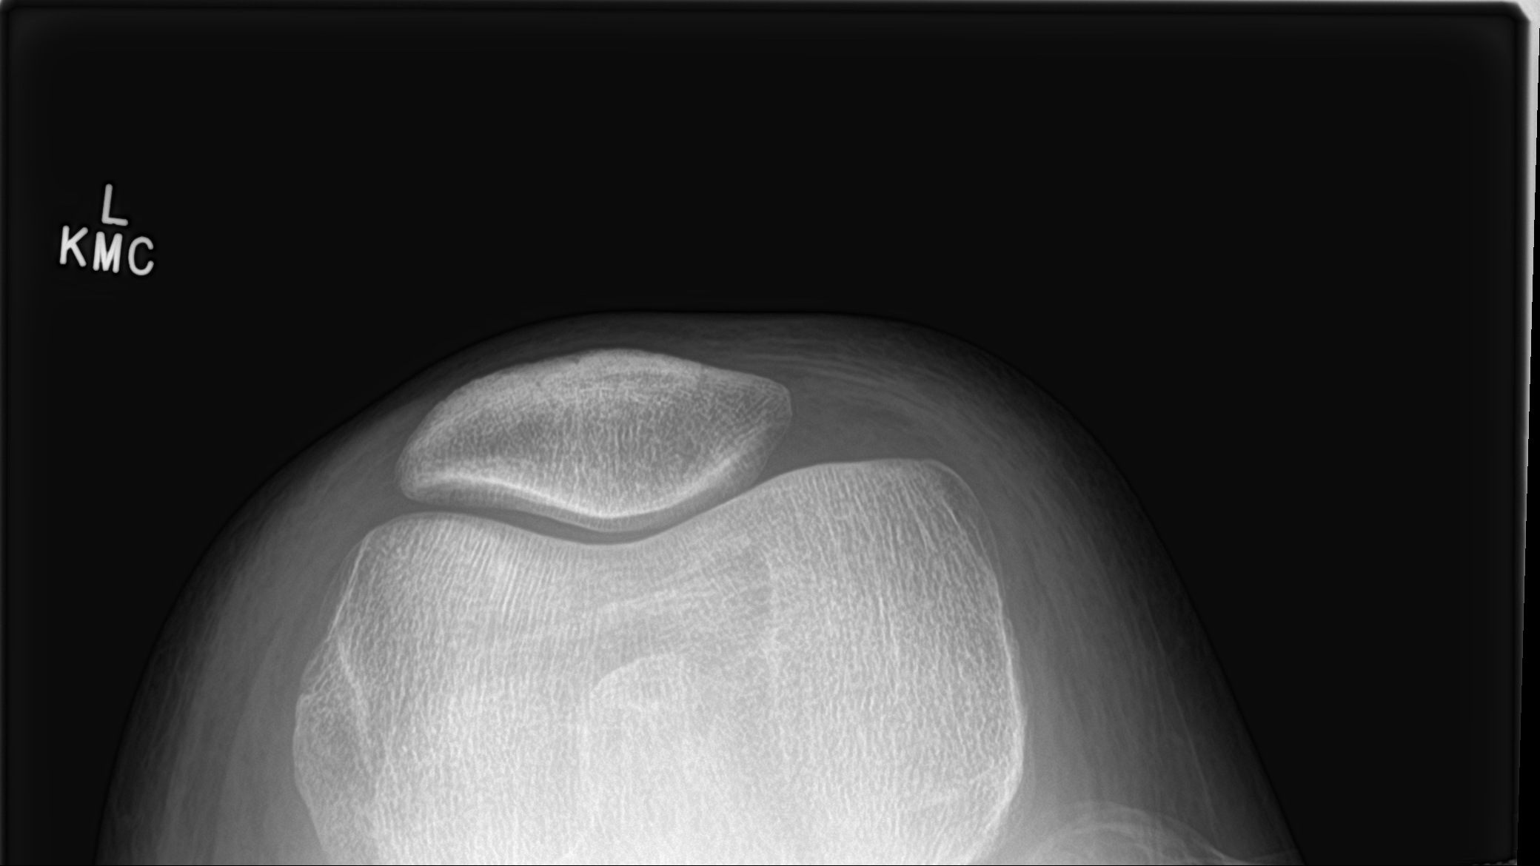

[3 of 3 positions shown; findings below may reference images not displayed]

FINDINGS: No evidence of fracture, dislocation. Small suprapatellar effusion
is identified. No evidence of arthropathy or other focal bone
abnormality. Soft tissues are unremarkable.
IMPRESSION: No acute fracture or dislocation. Small suprapatellar effusion. No
evidence of osteomyelitis.

## 2019-03-25 IMAGING — US US EXTREM LOW VENOUS*R*
1 series · 14 of 24 positions shown · non-contrast
Comparison: CT tib fib right [DATE]

CLINICAL DATA: Pain.  Calf DVT suggested on CT

EXAM:
RIGHT LOWER EXTREMITY VENOUS DOPPLER ULTRASOUND
TECHNIQUE: Gray-scale sonography with compression, as well as color and duplex
ultrasound, were performed to evaluate the deep venous system from
the level of the common femoral vein through the popliteal and
proximal calf veins.

[Series 1: us extrem low venous*right* · 44 acquisitions, 14 frames shown]
[im 1/44]
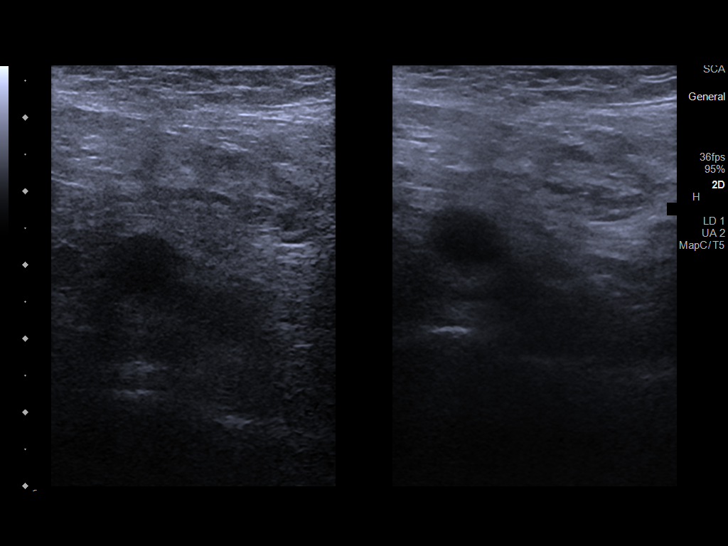
[im 4/44]
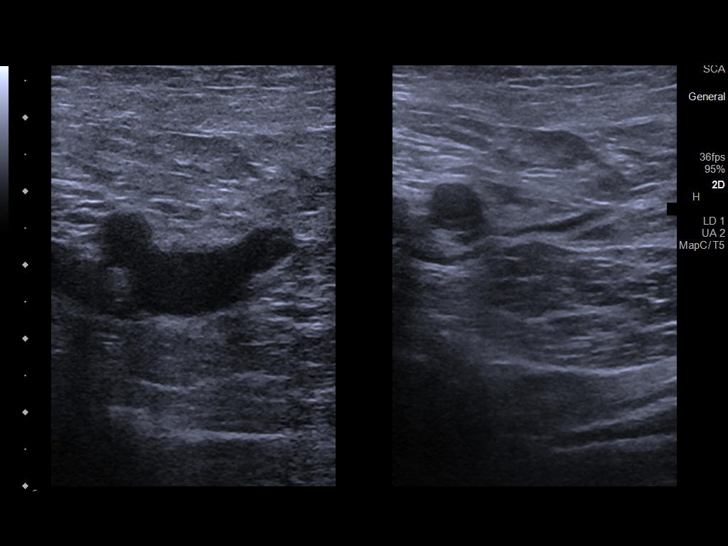
[im 10/44]
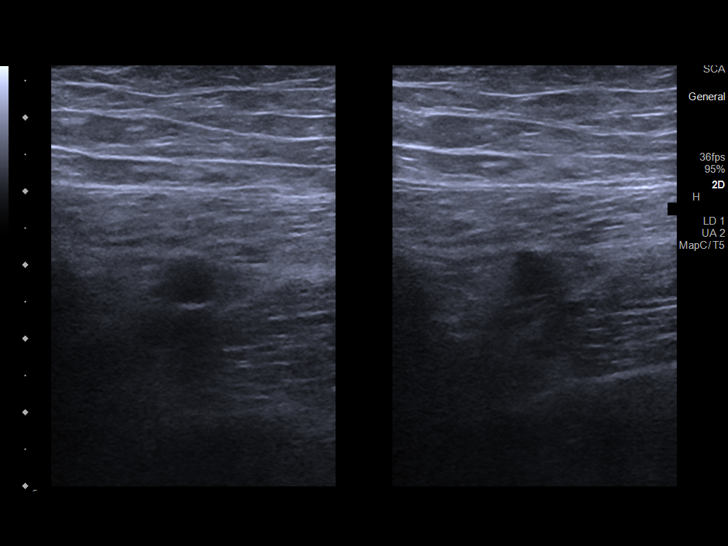
[im 14/44]
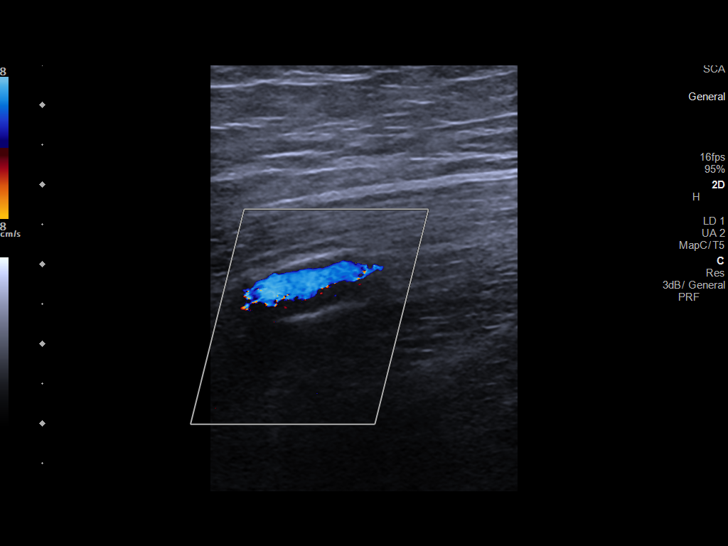
[im 15/44]
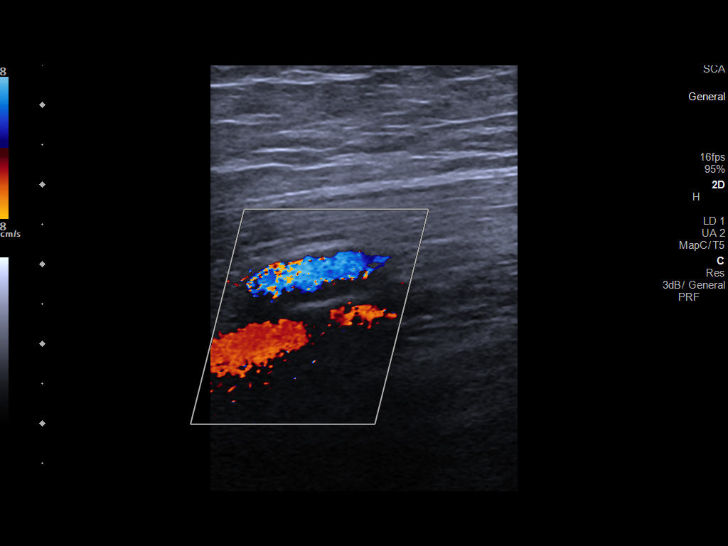
[im 19/44]
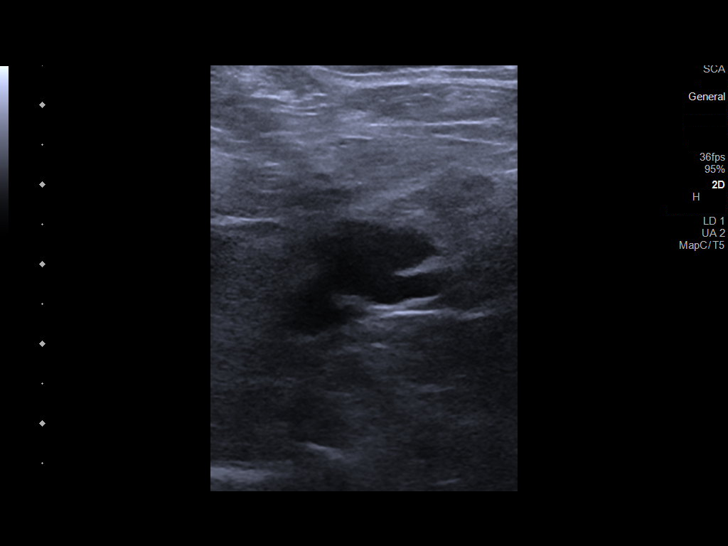
[im 21/44]
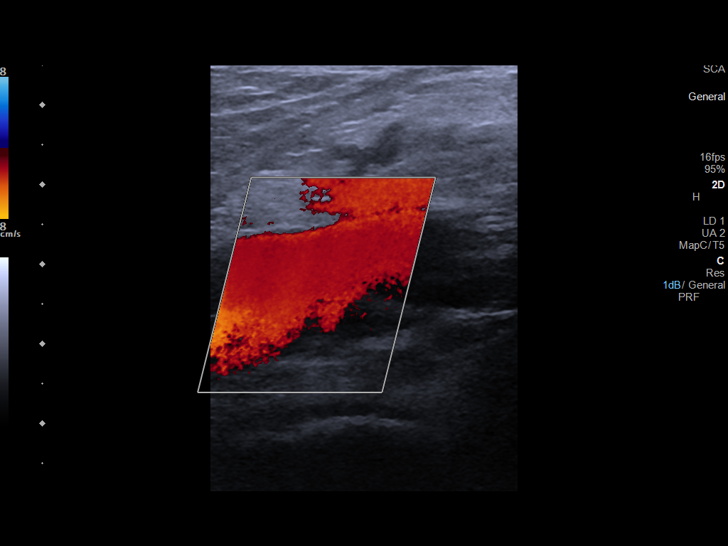
[im 23/44]
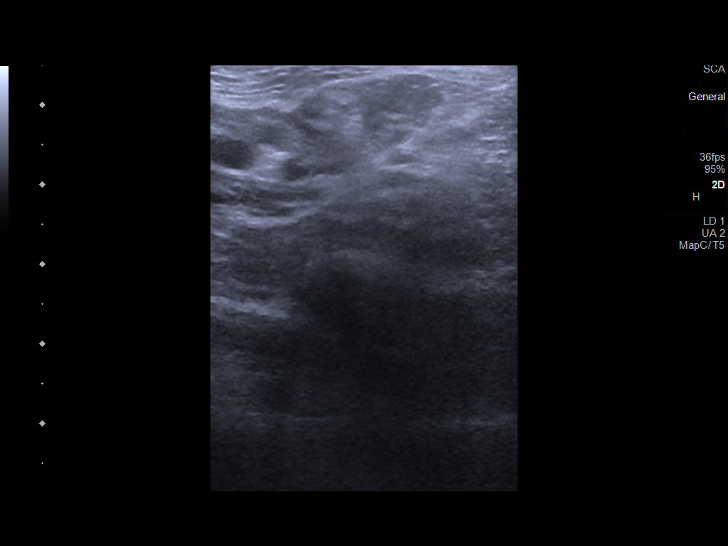
[im 27/44]
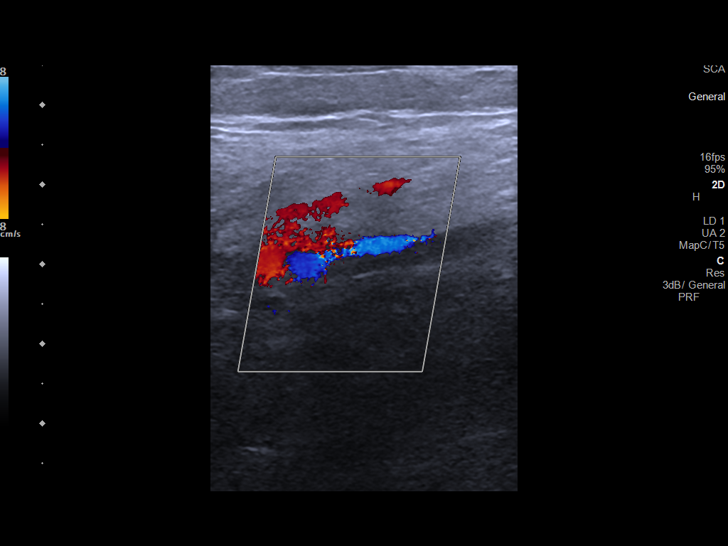
[im 29/44]
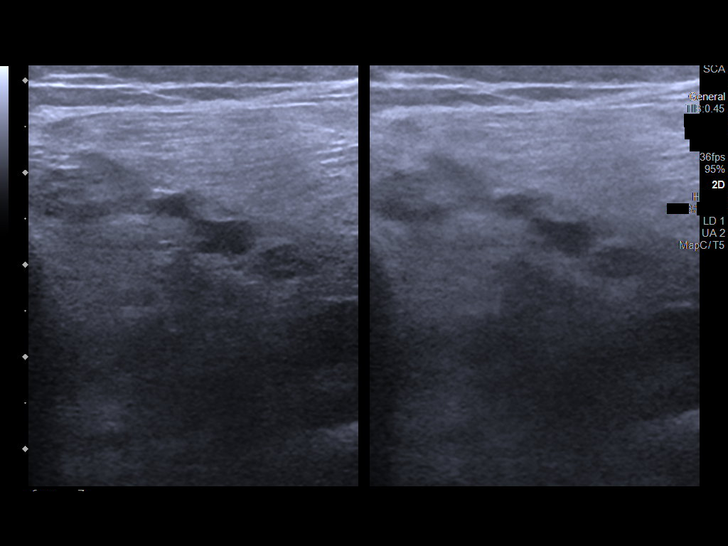
[im 32/44]
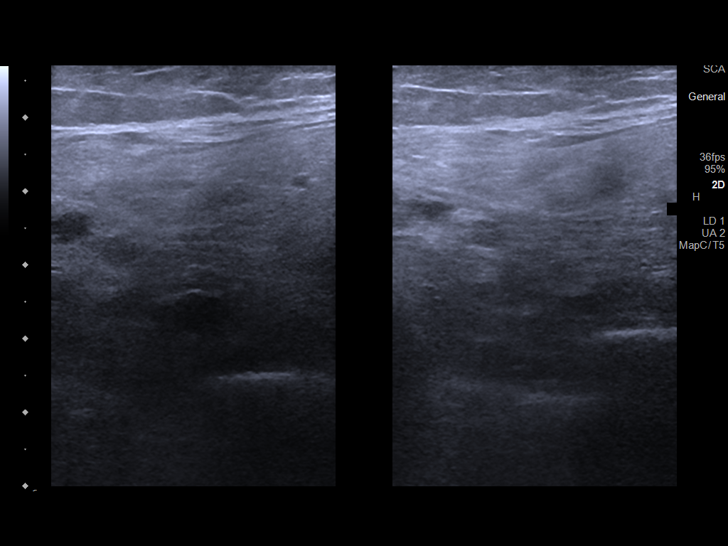
[im 34/44]
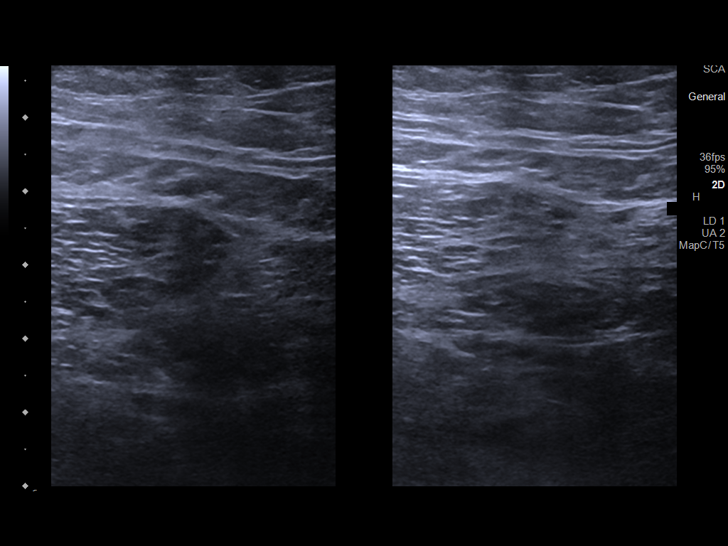
[im 40/44]
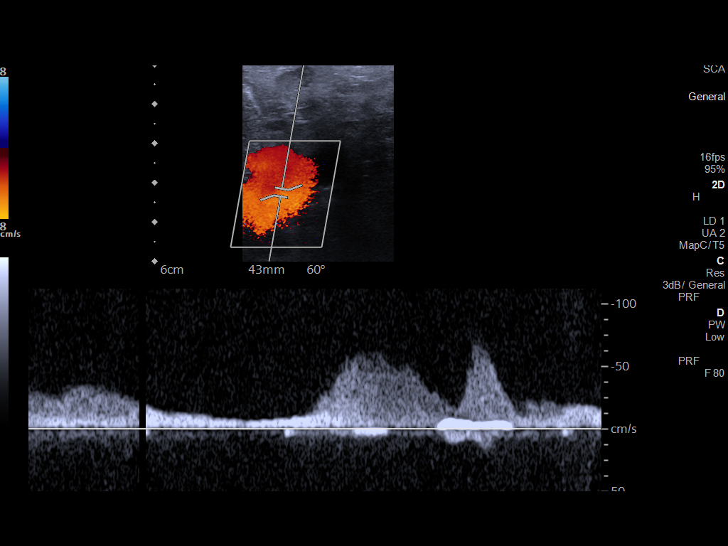
[im 44/44]
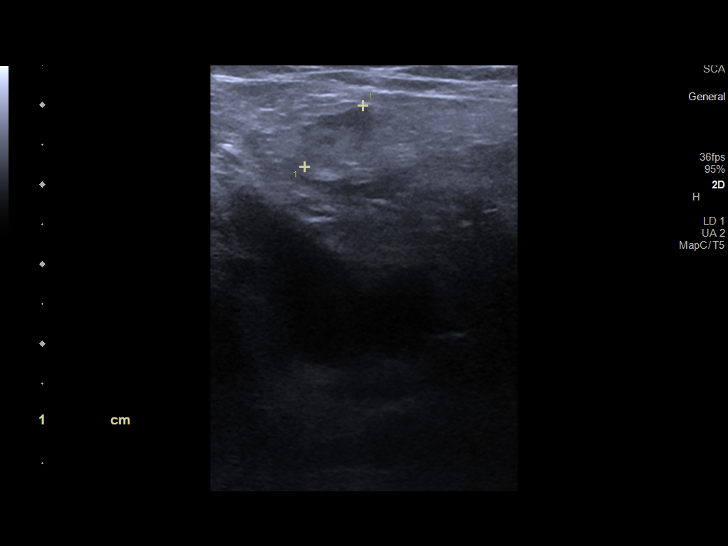

[14 of 24 positions shown; findings below may reference images not displayed]

FINDINGS: Normal compressibility of the common femoral, superficial femoral,
and popliteal veins. There is hypoechoic occlusive thrombus in
posterior tibial and peroneal veins.

Survey views of the contralateral common femoral vein are
unremarkable.

Morphologically unremarkable inguinal lymph nodes noted bilaterally.
IMPRESSION: 1. POSITIVE for isolated right calf (posterior tibial and peroneal)
DVT.

## 2019-03-25 MED ORDER — OXYCODONE-ACETAMINOPHEN 5-325 MG PO TABS: 1.0000 | ORAL_TABLET | Freq: Four times a day (QID) | ORAL | 0 refills | Status: DC | PRN

## 2019-03-25 NOTE — Telephone Encounter (Signed)
Reviewed pt records and ct which stated possible dvt. Ordered US to get clear understanding as Eliquis prohibits use of meds which may be useful for current knee pain and extremity pain. Ct unclear report of rt lower ext in regards to possible dvt.

## 2019-03-25 NOTE — Telephone Encounter (Signed)
Patient was sent up to clinic from radiology [facility]), d/t positive DVT in right leg. Hoyle Sauer from radiology spoke with Orene Desanctis, who relayed message to this nurse and Dr. Etter Sjogren.  Patient is accompanied by friend, both state pt is taking Eliquis 5mg  daily.  D/T complex medical history and current conditions, per Dr. Etter Sjogren, patient was advised to go downstairs to ER for evaluation.  Patient verbalized understanding.

## 2019-03-25 NOTE — ED Notes (Signed)
Patient was seen and treated / assessed by the MD - on d/c this RN noted no distress by the patient, pt is a/ox 4

## 2019-03-25 NOTE — ED Triage Notes (Signed)
Was hospitalized x2 weeks ago bilat leg surgery due to bone infection, upper left thigh and lower right leg. [facility], has severe left leg pain. PCP Dr Oletta Lamas upstairs sent him to ER. Friend here to help translate. States toes are turning balck

## 2019-03-26 NOTE — ED Provider Notes (Signed)
Emergency Department Provider Note   I have reviewed the triage vital signs and the nursing notes.   HISTORY  Chief Complaint No chief complaint on file.   HPI Thomas [name] is a 43 y.o. male presents to the emergency department after obtaining an outpatient ultrasound which showed DVT in the right calf.  Patient was initially having pain in the left leg.  He had a prolonged hospitalization complicated with osteomyelitis requiring surgery.  He was following with his PCP who sent him for plain films which were negative but when the DVT was found he was referred to the emergency department.  Patient was started on Eliquis during his hospitalization but is unsure exactly why this was done.  He denies any chest pain or shortness of breath.  No fevers or chills.  No opening of wounds or drainage.  No radiation of symptoms or other modifying factors.  Encounter performed with an Arabic interpreter (video).   Past Medical History:  Diagnosis Date  . Depression 01/27/2015  . Diabetes mellitus without complication ([place])   . Hyperlipidemia   . Hypertension     Patient Active Problem List   Diagnosis Date Noted  . Diabetes mellitus type II, uncontrolled ([place]) 01/27/2015  . Diabetic neuropathy ([place]) 01/27/2015  . HTN (hypertension) 01/27/2015  . Depression 01/27/2015  . Hyperlipidemia 01/27/2015    Past Surgical History:  Procedure Laterality Date  . kidney stones  2009    Allergies Patient has no known allergies.  No family history on file.  Social History Social History   Tobacco Use  . Smoking status: Current Every Day Smoker  . Smokeless tobacco: Never Used  Substance Use Topics  . Alcohol use: No    Alcohol/week: 0.0 standard drinks  . Drug use: No    Review of Systems  Constitutional: No fever/chills Eyes: No visual changes. ENT: No sore throat. Cardiovascular: Denies chest pain. Respiratory: Denies shortness of breath. Gastrointestinal: No abdominal  pain.  No nausea, no vomiting.  No diarrhea.  No constipation. Genitourinary: Negative for dysuria. Musculoskeletal: Negative for back pain. Positive leg pain.  Skin: Negative for rash. Neurological: Negative for headaches, focal weakness or numbness.  10-point ROS otherwise negative.  ____________________________________________   PHYSICAL EXAM:  VITAL SIGNS: ED Triage Vitals  Enc Vitals Group     BP 03/25/19 1608 117/90     Pulse Rate 03/25/19 1608 60     Resp 03/25/19 1608 18     Temp 03/25/19 1608 99.2 F (37.3 C)     Temp Source 03/25/19 1608 Oral     SpO2 03/25/19 1608 100 %     Weight 03/25/19 1609 194 lb (88 kg)     Height 03/25/19 1609 5\' 10"  (1.778 m)   Constitutional: Alert and oriented. Well appearing and in no acute distress. Eyes: Conjunctivae are normal.  Head: Atraumatic. Nose: No congestion/rhinnorhea. Mouth/Throat: Mucous membranes are moist.  Neck: No stridor.  Cardiovascular: Normal rate, regular rhythm. Good peripheral circulation. Grossly normal heart sounds.   Respiratory: Normal respiratory effort.  No retractions. Lungs CTAB. Gastrointestinal: Soft and nontender. No distention.  Musculoskeletal: No lower extremity tenderness nor edema. No gross deformities of extremities. Neurologic:  Normal speech and language. No gross focal neurologic deficits are appreciated.  Skin:  Skin is warm, dry and intact. No rash noted.  ____________________________________________  RADIOLOGY  US Venous Img Lower Unilateral Right  Result Date: 03/25/2019 CLINICAL DATA:  Pain.  Calf DVT suggested on CT EXAM: RIGHT LOWER EXTREMITY VENOUS  DOPPLER ULTRASOUND TECHNIQUE: Gray-scale sonography with compression, as well as color and duplex ultrasound, were performed to evaluate the deep venous system from the level of the common femoral vein through the popliteal and proximal calf veins. COMPARISON:  CT tib fib right 03/03/2019 FINDINGS: Normal compressibility of the common  femoral, superficial femoral, and popliteal veins. There is hypoechoic occlusive thrombus in posterior tibial and peroneal veins. Survey views of the contralateral common femoral vein are unremarkable. Morphologically unremarkable inguinal lymph nodes noted bilaterally. IMPRESSION: 1. POSITIVE for isolated right calf (posterior tibial and peroneal) DVT. Electronically Signed   By: Corlis Leak M.D.   On: 03/25/2019 16:11   DG Knee 3 Views Left  Result Date: 03/25/2019 CLINICAL DATA:  Anterior left knee pain, status post recent knee surgery, assess for osteomyelitis. EXAM: LEFT KNEE - 3 VIEW COMPARISON:  None. FINDINGS: No evidence of fracture, dislocation. Small suprapatellar effusion is identified. No evidence of arthropathy or other focal bone abnormality. Soft tissues are unremarkable. IMPRESSION: No acute fracture or dislocation. Small suprapatellar effusion. No evidence of osteomyelitis. Electronically Signed   By: Sherian Rein M.D.   On: 03/25/2019 15:32    ____________________________________________   PROCEDURES  Procedure(s) performed:   Procedures  None ____________________________________________   INITIAL IMPRESSION / ASSESSMENT AND PLAN / ED COURSE  Pertinent labs & imaging results that were available during my care of the patient were reviewed by me and considered in my medical decision making (see chart for details).   Patient presents to the emergency department with concern for acute DVT in the right calf.  In review of the patient's medical history and admission from [facility] in care everywhere patient was started on Eliquis during that hospitalization with DVT as the indication.  In review of CT imaging from that admission they found a DVT in the right calf at that time.  It appears that the DVT seen on today's ultrasound correlates with that finding and I do not feel this is a new DVT or indication of Eliquis failure.  Patient is not having signs or symptoms of DVT.   His legs are well-appearing without evidence of cellulitis or septic joint.  I explained the patient's diagnosis to him and will not start any new medications.  He will continue his anticoagulation and outpatient antibiotics.  Encouraged him to follow with his PCP for discussion.   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  Alona Bene, MD, [facility] Emergency Medicine    Wynette Jersey, Arlyss Repress, MD 03/26/19 1318

## 2019-04-02 ENCOUNTER — Telehealth: Payer: Self-pay | Admitting: Medical

## 2019-04-02 ENCOUNTER — Other Ambulatory Visit: Payer: Self-pay | Admitting: Medical

## 2019-04-02 NOTE — Telephone Encounter (Signed)
Requested medication (s) are due for refill today:yes  Requested medication (s) are on the active medication list: yes  Last refill:  11/27/2018  Future visit scheduled: yes  Notes to clinic:  looks like dose was going to be changing titrating. Please advise    Requested Prescriptions  Pending Prescriptions Disp Refills   Insulin Glargine (LANTUS SOLOSTAR) 100 UNIT/ML Solostar Pen 5 pen PRN    Sig: 10 units q hs      Endocrinology:  Diabetes - Insulins Failed - 04/02/2019  1:22 PM      Failed - HBA1C is between 0 and 7.9 and within 180 days    Hgb A1c MFr Bld  Date Value Ref Range Status  11/26/2018 15.2 (H) 4.6 - 6.5 % Final    Comment:    Glycemic Control Guidelines for People with Diabetes:Non Diabetic:  <6%Goal of Therapy: <7%Additional Action Suggested:  >8%           Passed - Valid encounter within last 6 months    Recent Outpatient Visits           1 week ago Type 2 diabetes mellitus without complication, without long-term current use of insulin ([place])   [facility] [place], PA-C   2 weeks ago Pain of toe of right foot   [facility] at [place], PA-C   4 months ago Type 2 diabetes mellitus without complication, without long-term current use of insulin ([place])   [facility] at [place], PA-C   7 months ago Type 2 diabetes mellitus without complication, without long-term current use of insulin ([place])   [facility] at [place], PA-C   1 year ago Type 2 diabetes mellitus without complication, without long-term current use of insulin ([place])   [facility] at [place], [employer]             In 1 week [place], [employer], PA-C [employer] [facility] [employer], [facility]

## 2019-04-02 NOTE — Telephone Encounter (Signed)
Copied from [place] [id number]. Topic: General - Other >> Apr 01, 2019  1:00 PM Burchel, Abbi R wrote: Estill Bamberg (wound care) called to let Thomas [name] know pt is sched: 04/22/2019 1:15 pm

## 2019-04-02 NOTE — Telephone Encounter (Signed)
Medication Refill - Medication: Insulin Glargine (LANTUS SOLOSTAR) 100 UNIT/ML Solostar Pen  Preferred Pharmacy:  [facility], [place] Phone:  [phone]  Fax:  [phone]       Pt was  advised that RX refills may take up to 3 business days. We ask that you follow-up with your pharmacy.

## 2019-04-03 MED ORDER — LANTUS SOLOSTAR 100 UNIT/ML ~~LOC~~ SOPN: PEN_INJECTOR | SUBCUTANEOUS | 3 refills | Status: DC

## 2019-04-13 ENCOUNTER — Ambulatory Visit: Payer: No Typology Code available for payment source | Admitting: Medical

## 2019-04-13 ENCOUNTER — Emergency Department (HOSPITAL_BASED_OUTPATIENT_CLINIC_OR_DEPARTMENT_OTHER): Payer: Medicaid Other

## 2019-04-13 ENCOUNTER — Encounter: Payer: Self-pay | Admitting: Medical

## 2019-04-13 ENCOUNTER — Other Ambulatory Visit: Payer: Self-pay

## 2019-04-13 ENCOUNTER — Encounter (HOSPITAL_BASED_OUTPATIENT_CLINIC_OR_DEPARTMENT_OTHER): Payer: Self-pay | Admitting: *Deleted

## 2019-04-13 ENCOUNTER — Inpatient Hospital Stay (HOSPITAL_BASED_OUTPATIENT_CLINIC_OR_DEPARTMENT_OTHER)
Admission: EM | Admit: 2019-04-13 | Discharge: 2019-05-03 | DRG: 854 | Disposition: A | Discharge status: Home Health | Payer: Medicaid Other | Attending: Internal Medicine | Admitting: Internal Medicine

## 2019-04-13 VITALS — BP 90/50 | HR 125 | Temp 96.0°F | Resp 18 | Wt 186.8 lb

## 2019-04-13 DIAGNOSIS — E1142 Type 2 diabetes mellitus with diabetic polyneuropathy: Secondary | ICD-10-CM | POA: Diagnosis present

## 2019-04-13 DIAGNOSIS — Y838 Other surgical procedures as the cause of abnormal reaction of the patient, or of later complication, without mention of misadventure at the time of the procedure: Secondary | ICD-10-CM | POA: Diagnosis not present

## 2019-04-13 DIAGNOSIS — K6289 Other specified diseases of anus and rectum: Secondary | ICD-10-CM | POA: Diagnosis present

## 2019-04-13 DIAGNOSIS — Z794 Long term (current) use of insulin: Secondary | ICD-10-CM | POA: Diagnosis not present

## 2019-04-13 DIAGNOSIS — D62 Acute posthemorrhagic anemia: Secondary | ICD-10-CM | POA: Diagnosis not present

## 2019-04-13 DIAGNOSIS — E1165 Type 2 diabetes mellitus with hyperglycemia: Secondary | ICD-10-CM | POA: Diagnosis present

## 2019-04-13 DIAGNOSIS — I10 Essential (primary) hypertension: Secondary | ICD-10-CM | POA: Diagnosis present

## 2019-04-13 DIAGNOSIS — M4645 Discitis, unspecified, thoracolumbar region: Secondary | ICD-10-CM | POA: Diagnosis present

## 2019-04-13 DIAGNOSIS — R5383 Other fatigue: Secondary | ICD-10-CM

## 2019-04-13 DIAGNOSIS — I82442 Acute embolism and thrombosis of left tibial vein: Secondary | ICD-10-CM | POA: Diagnosis not present

## 2019-04-13 DIAGNOSIS — Z7901 Long term (current) use of anticoagulants: Secondary | ICD-10-CM

## 2019-04-13 DIAGNOSIS — Z87442 Personal history of urinary calculi: Secondary | ICD-10-CM

## 2019-04-13 DIAGNOSIS — A419 Sepsis, unspecified organism: Secondary | ICD-10-CM

## 2019-04-13 DIAGNOSIS — R7881 Bacteremia: Secondary | ICD-10-CM | POA: Diagnosis present

## 2019-04-13 DIAGNOSIS — IMO0002 Reserved for concepts with insufficient information to code with codable children: Secondary | ICD-10-CM | POA: Diagnosis present

## 2019-04-13 DIAGNOSIS — Z79899 Other long term (current) drug therapy: Secondary | ICD-10-CM

## 2019-04-13 DIAGNOSIS — R0781 Pleurodynia: Secondary | ICD-10-CM

## 2019-04-13 DIAGNOSIS — R8290 Unspecified abnormal findings in urine: Secondary | ICD-10-CM

## 2019-04-13 DIAGNOSIS — M40209 Unspecified kyphosis, site unspecified: Secondary | ICD-10-CM | POA: Diagnosis present

## 2019-04-13 DIAGNOSIS — I771 Stricture of artery: Secondary | ICD-10-CM | POA: Diagnosis present

## 2019-04-13 DIAGNOSIS — Y92009 Unspecified place in unspecified non-institutional (private) residence as the place of occurrence of the external cause: Secondary | ICD-10-CM

## 2019-04-13 DIAGNOSIS — I82451 Acute embolism and thrombosis of right peroneal vein: Secondary | ICD-10-CM | POA: Diagnosis present

## 2019-04-13 DIAGNOSIS — Z452 Encounter for adjustment and management of vascular access device: Secondary | ICD-10-CM

## 2019-04-13 DIAGNOSIS — I82439 Acute embolism and thrombosis of unspecified popliteal vein: Secondary | ICD-10-CM | POA: Diagnosis present

## 2019-04-13 DIAGNOSIS — I70261 Atherosclerosis of native arteries of extremities with gangrene, right leg: Secondary | ICD-10-CM | POA: Diagnosis not present

## 2019-04-13 DIAGNOSIS — R42 Dizziness and giddiness: Secondary | ICD-10-CM

## 2019-04-13 DIAGNOSIS — M4624 Osteomyelitis of vertebra, thoracic region: Secondary | ICD-10-CM | POA: Diagnosis present

## 2019-04-13 DIAGNOSIS — E785 Hyperlipidemia, unspecified: Secondary | ICD-10-CM | POA: Diagnosis present

## 2019-04-13 DIAGNOSIS — A4102 Sepsis due to Methicillin resistant Staphylococcus aureus: Secondary | ICD-10-CM | POA: Diagnosis not present

## 2019-04-13 DIAGNOSIS — E1169 Type 2 diabetes mellitus with other specified complication: Secondary | ICD-10-CM | POA: Diagnosis present

## 2019-04-13 DIAGNOSIS — K629 Disease of anus and rectum, unspecified: Secondary | ICD-10-CM | POA: Diagnosis present

## 2019-04-13 DIAGNOSIS — M4804 Spinal stenosis, thoracic region: Secondary | ICD-10-CM | POA: Diagnosis present

## 2019-04-13 DIAGNOSIS — I058 Other rheumatic mitral valve diseases: Secondary | ICD-10-CM | POA: Diagnosis present

## 2019-04-13 DIAGNOSIS — R918 Other nonspecific abnormal finding of lung field: Secondary | ICD-10-CM | POA: Diagnosis not present

## 2019-04-13 DIAGNOSIS — L7632 Postprocedural hematoma of skin and subcutaneous tissue following other procedure: Secondary | ICD-10-CM | POA: Diagnosis not present

## 2019-04-13 DIAGNOSIS — E1152 Type 2 diabetes mellitus with diabetic peripheral angiopathy with gangrene: Secondary | ICD-10-CM | POA: Diagnosis not present

## 2019-04-13 DIAGNOSIS — W19XXXA Unspecified fall, initial encounter: Secondary | ICD-10-CM | POA: Diagnosis present

## 2019-04-13 DIAGNOSIS — M899 Disorder of bone, unspecified: Secondary | ICD-10-CM | POA: Diagnosis not present

## 2019-04-13 DIAGNOSIS — I959 Hypotension, unspecified: Secondary | ICD-10-CM

## 2019-04-13 DIAGNOSIS — M4854XA Collapsed vertebra, not elsewhere classified, thoracic region, initial encounter for fracture: Secondary | ICD-10-CM | POA: Diagnosis present

## 2019-04-13 DIAGNOSIS — Z8679 Personal history of other diseases of the circulatory system: Secondary | ICD-10-CM

## 2019-04-13 DIAGNOSIS — M546 Pain in thoracic spine: Secondary | ICD-10-CM

## 2019-04-13 DIAGNOSIS — Z87891 Personal history of nicotine dependence: Secondary | ICD-10-CM

## 2019-04-13 DIAGNOSIS — I82469 Acute embolism and thrombosis of unspecified calf muscular vein: Secondary | ICD-10-CM

## 2019-04-13 DIAGNOSIS — E1151 Type 2 diabetes mellitus with diabetic peripheral angiopathy without gangrene: Secondary | ICD-10-CM | POA: Diagnosis present

## 2019-04-13 DIAGNOSIS — I059 Rheumatic mitral valve disease, unspecified: Secondary | ICD-10-CM | POA: Diagnosis present

## 2019-04-13 DIAGNOSIS — Z86718 Personal history of other venous thrombosis and embolism: Secondary | ICD-10-CM

## 2019-04-13 DIAGNOSIS — R000 Tachycardia, unspecified: Secondary | ICD-10-CM

## 2019-04-13 DIAGNOSIS — I708 Atherosclerosis of other arteries: Secondary | ICD-10-CM | POA: Diagnosis present

## 2019-04-13 DIAGNOSIS — I96 Gangrene, not elsewhere classified: Secondary | ICD-10-CM | POA: Diagnosis present

## 2019-04-13 DIAGNOSIS — M4620 Osteomyelitis of vertebra, site unspecified: Secondary | ICD-10-CM | POA: Diagnosis present

## 2019-04-13 DIAGNOSIS — Z20822 Contact with and (suspected) exposure to covid-19: Secondary | ICD-10-CM | POA: Diagnosis not present

## 2019-04-13 LAB — GLUCOSE, POCT (MANUAL RESULT ENTRY): POC Glucose: 231 mg/dl — AB (ref 70–99)

## 2019-04-13 LAB — CBG MONITORING, ED: Glucose-Capillary: 252 mg/dL — ABNORMAL HIGH (ref 70–99)

## 2019-04-13 LAB — HEPATIC FUNCTION PANEL
ALT: 25 U/L (ref 0–44)
AST: 14 U/L — ABNORMAL LOW (ref 15–41)
Albumin: 3 g/dL — ABNORMAL LOW (ref 3.5–5.0)
Alkaline Phosphatase: 84 U/L (ref 38–126)
Bilirubin, Direct: <0.1 mg/dL (ref 0.0–0.2)
Total Bilirubin: 0.4 mg/dL (ref 0.3–1.2)
Total Protein: 8.8 g/dL — ABNORMAL HIGH (ref 6.5–8.1)

## 2019-04-13 LAB — CBC
HCT: 30.7 % — ABNORMAL LOW (ref 39.0–52.0)
Hemoglobin: 9.4 g/dL — ABNORMAL LOW (ref 13.0–17.0)
MCH: 24.5 pg — ABNORMAL LOW (ref 26.0–34.0)
MCHC: 30.6 g/dL (ref 30.0–36.0)
MCV: 79.9 fL — ABNORMAL LOW (ref 80.0–100.0)
Platelets: 647 10*3/uL — ABNORMAL HIGH (ref 150–400)
RBC: 3.84 MIL/uL — ABNORMAL LOW (ref 4.22–5.81)
RDW: 14.6 % (ref 11.5–15.5)
WBC: 15.2 10*3/uL — ABNORMAL HIGH (ref 4.0–10.5)
nRBC: 0 % (ref 0.0–0.2)

## 2019-04-13 LAB — SARS CORONAVIRUS 2 AG (30 MIN TAT): SARS Coronavirus 2 Ag: NEGATIVE

## 2019-04-13 LAB — URINALYSIS, ROUTINE W REFLEX MICROSCOPIC
Bilirubin Urine: NEGATIVE
Glucose, UA: NEGATIVE mg/dL
Hgb urine dipstick: NEGATIVE
Ketones, ur: NEGATIVE mg/dL
Leukocytes,Ua: NEGATIVE
Nitrite: NEGATIVE
Protein, ur: 30 mg/dL — AB
Specific Gravity, Urine: 1.015 (ref 1.005–1.030)
pH: 5.5 (ref 5.0–8.0)

## 2019-04-13 LAB — PROTIME-INR
INR: 1.3 — ABNORMAL HIGH (ref 0.8–1.2)
Prothrombin Time: 16.1 seconds — ABNORMAL HIGH (ref 11.4–15.2)

## 2019-04-13 LAB — URINALYSIS, MICROSCOPIC (REFLEX)

## 2019-04-13 LAB — BASIC METABOLIC PANEL
Anion gap: 12 (ref 5–15)
BUN: 26 mg/dL — ABNORMAL HIGH (ref 6–20)
CO2: 24 mmol/L (ref 22–32)
Calcium: 9.5 mg/dL (ref 8.9–10.3)
Chloride: 97 mmol/L — ABNORMAL LOW (ref 98–111)
Creatinine, Ser: 1.37 mg/dL — ABNORMAL HIGH (ref 0.61–1.24)
GFR calc Af Amer: >60 mL/min (ref >60)
GFR calc non Af Amer: >60 mL/min (ref >60)
Glucose, Bld: 267 mg/dL — ABNORMAL HIGH (ref 70–99)
Potassium: 4.1 mmol/L (ref 3.5–5.1)
Sodium: 133 mmol/L — ABNORMAL LOW (ref 135–145)

## 2019-04-13 LAB — LACTIC ACID, PLASMA
Lactic Acid, Venous: 2 mmol/L (ref 0.5–1.9)
Lactic Acid, Venous: 1.1 mmol/L (ref 0.5–1.9)

## 2019-04-13 LAB — LIPASE, BLOOD: Lipase: 20 U/L (ref 11–51)

## 2019-04-13 LAB — TROPONIN I (HIGH SENSITIVITY)
Troponin I (High Sensitivity): 4 ng/L (ref <18)
Troponin I (High Sensitivity): 4 ng/L (ref <18)

## 2019-04-13 LAB — APTT: aPTT: 43 seconds — ABNORMAL HIGH (ref 24–36)

## 2019-04-13 IMAGING — DX DG CHEST 1V PORT
1 series · 1 of 1 positions shown · non-contrast
Comparison: [DATE], [DATE]

CLINICAL DATA: 43-year-old male with a history of chills and recent
endocarditis

EXAM:
PORTABLE CHEST 1 VIEW

[chest ap]
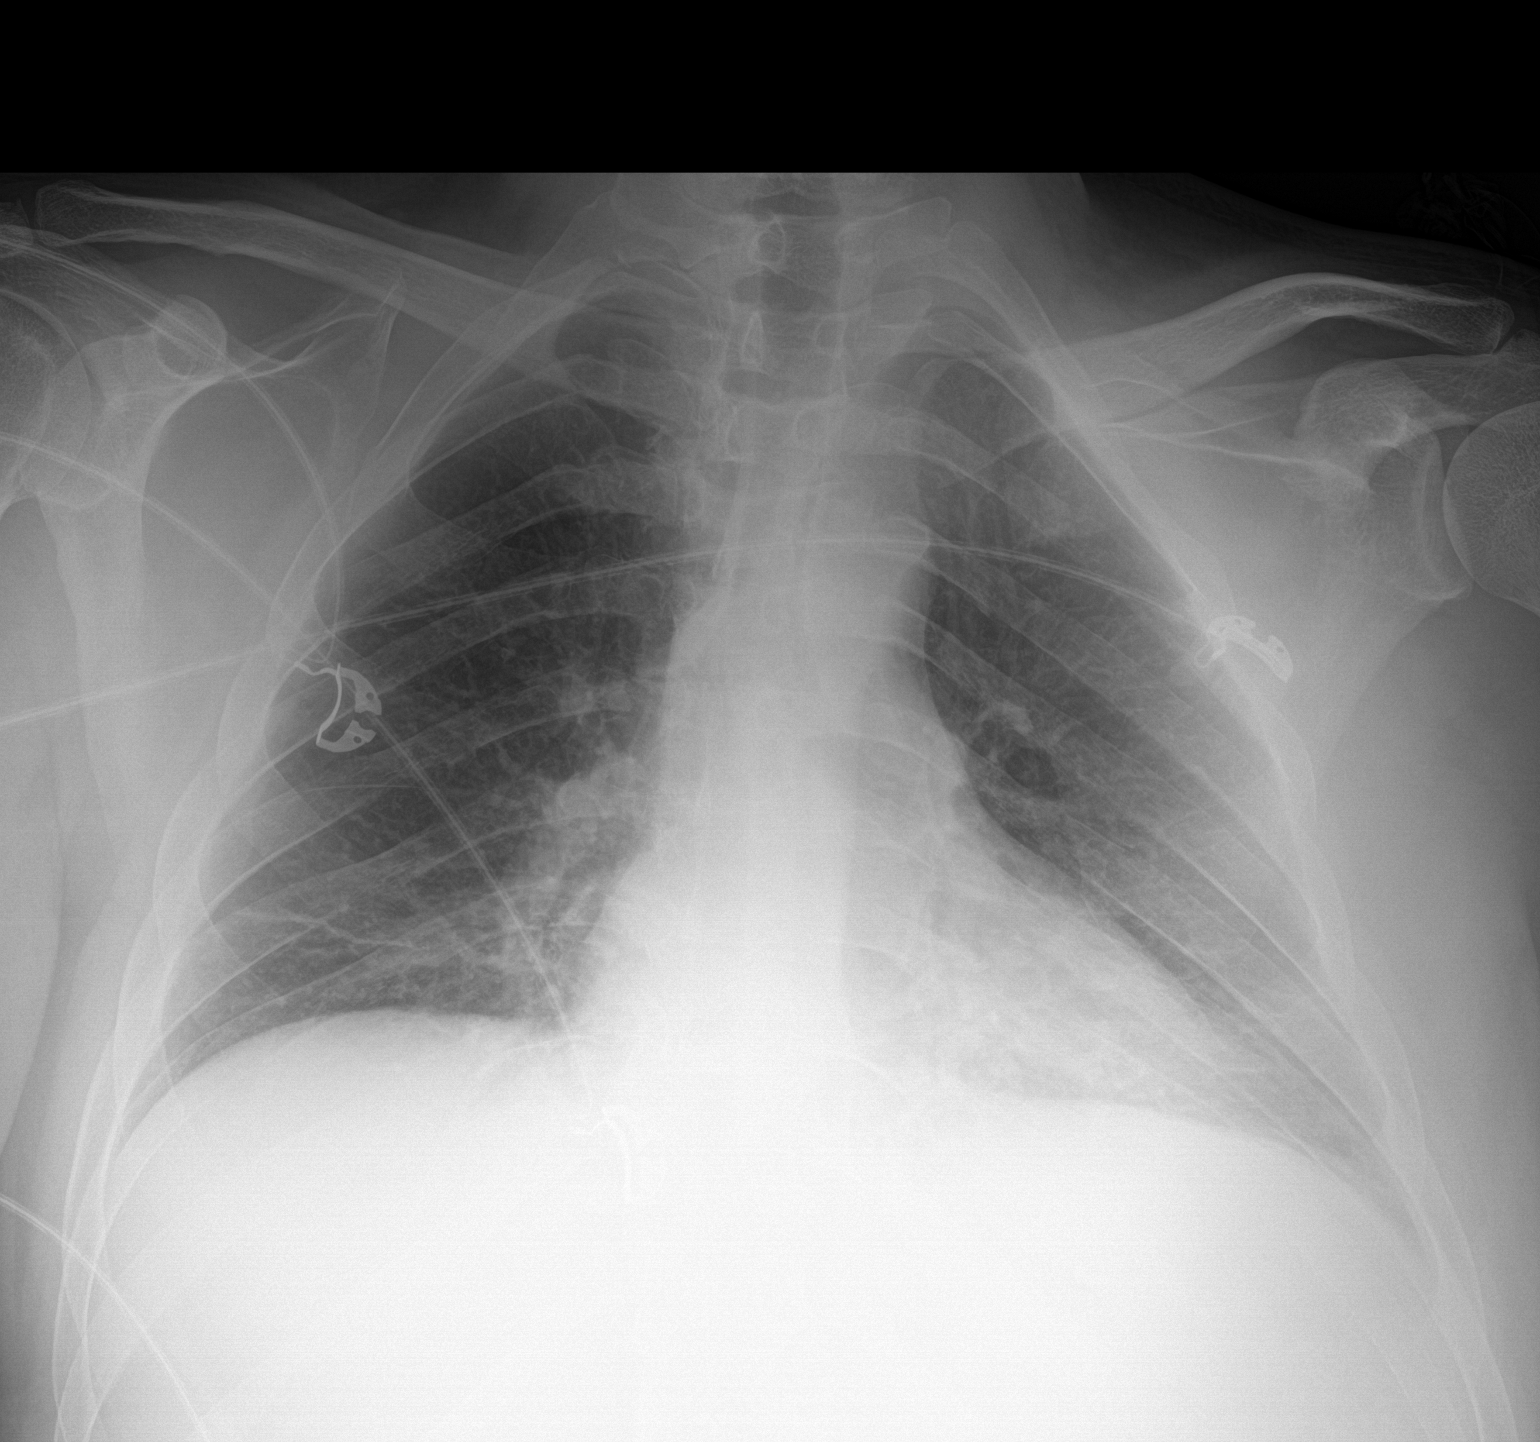

[1 of 1 positions shown; findings below may reference images not displayed]

FINDINGS: Cardiomediastinal silhouette unchanged in size contour.

Questionable reticulonodular opacity overlying the left cardiac
border. No pneumothorax. No pleural effusion.
IMPRESSION: Questionable reticulonodular opacity overlying the cardiac
silhouette and the left heart border, potentially developing
infection. Further evaluation with a formal PA and lateral chest
x-ray may be useful, or alternatively CT.

## 2019-04-13 IMAGING — CT CT ABD-PELV W/ CM
4 of 14 series · 14 of 46 positions shown, 18 images · IV contrast (Omnipaque)
Comparison: [DATE].

CLINICAL DATA: Sepsis.

EXAM:
CT ANGIOGRAPHY CHEST
CT ABDOMEN AND PELVIS WITH CONTRAST
TECHNIQUE: Multidetector CT imaging of the chest was performed using the
standard protocol during bolus administration of intravenous
contrast. Multiplanar CT image reconstructions and MIPs were
obtained to evaluate the vascular anatomy. Multidetector CT imaging
of the abdomen and pelvis was performed using the standard protocol
during bolus administration of intravenous contrast.
CONTRAST:  100mL OMNIPAQUE IOHEXOL 350 MG/ML SOLN

[Series 5: pe axial st · axial · 0.86mm/px · z∈[-292,-196]mm · 2 of 130 slices shown]
[im 33/130  soft-tissue]
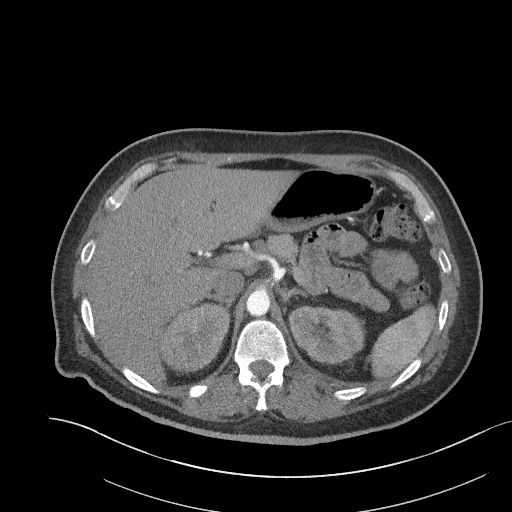
[im 65/130  soft-tissue]
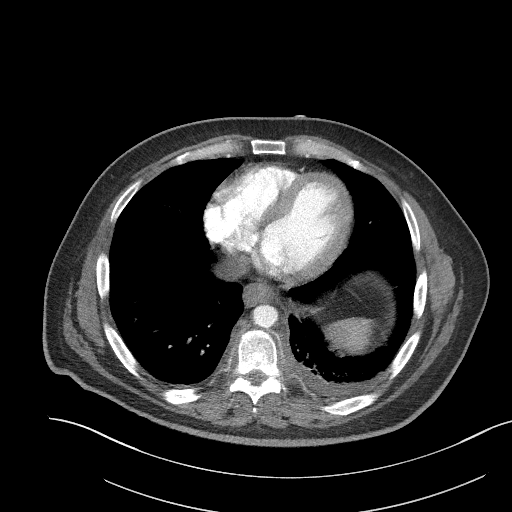

[Series 6: pe thins · axial · 0.86mm/px · z∈[-364,-26]mm · 8 of 388 slices shown]
[im 25/388  soft-tissue]
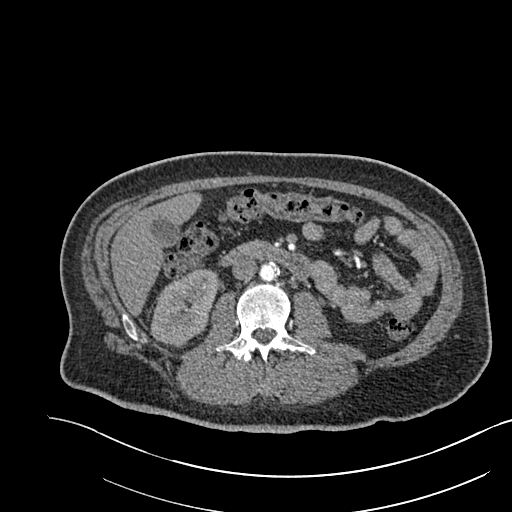
[im 73/388  soft-tissue]
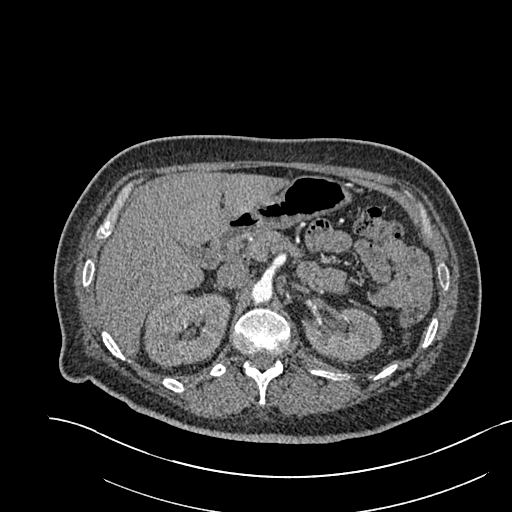
[im 121/388  soft-tissue]
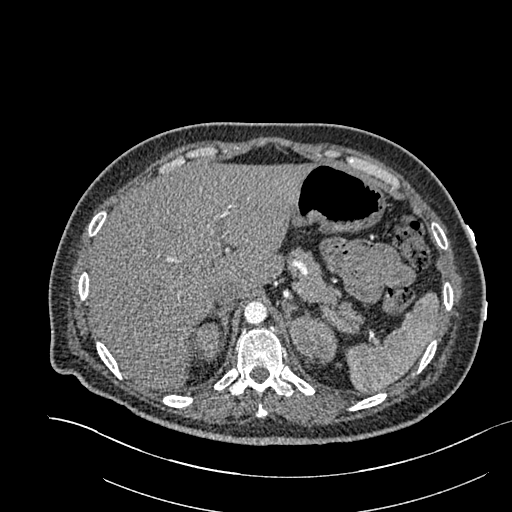
[im 170/388  soft-tissue]
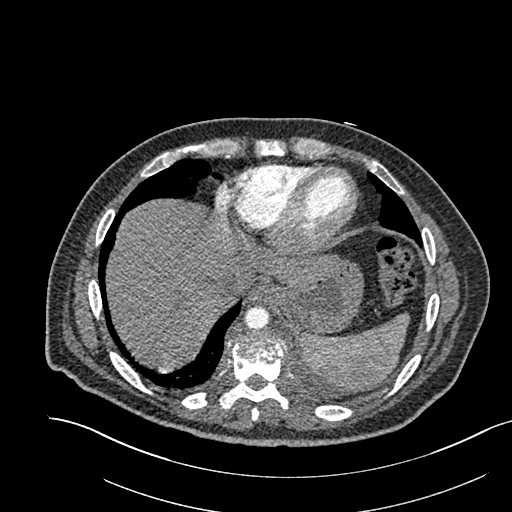
[im 218/388  soft-tissue]
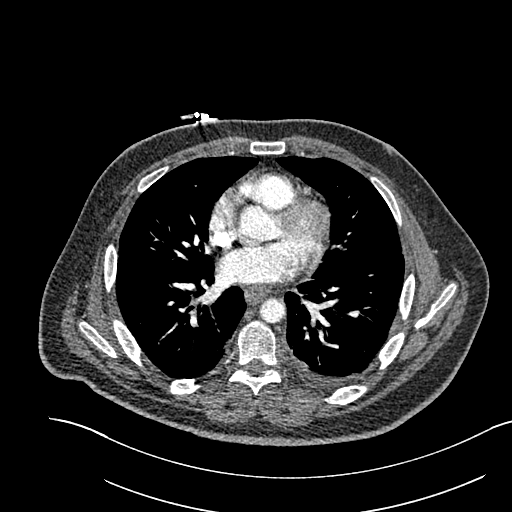
[im 267/388  soft-tissue]
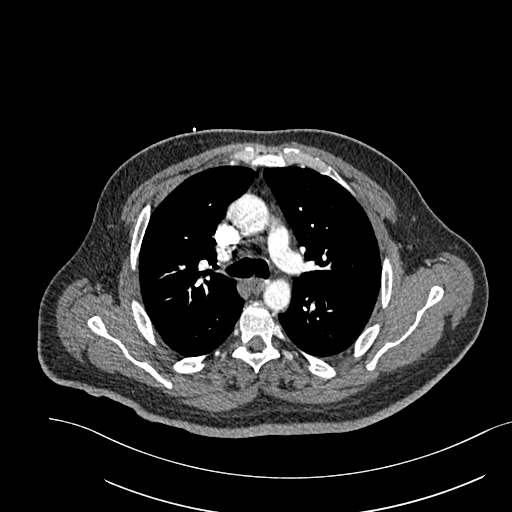
[im 315/388  soft-tissue]
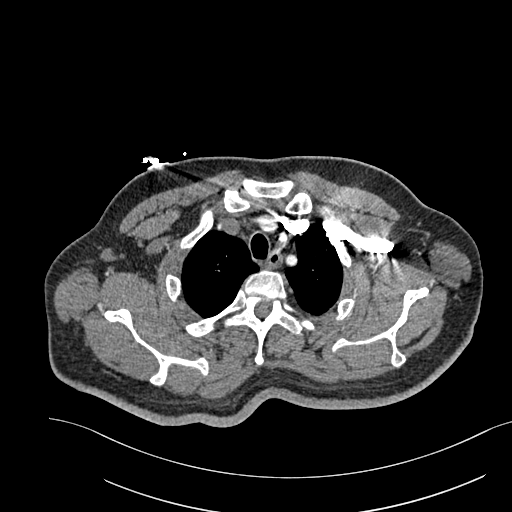
[im 363/388  soft-tissue]
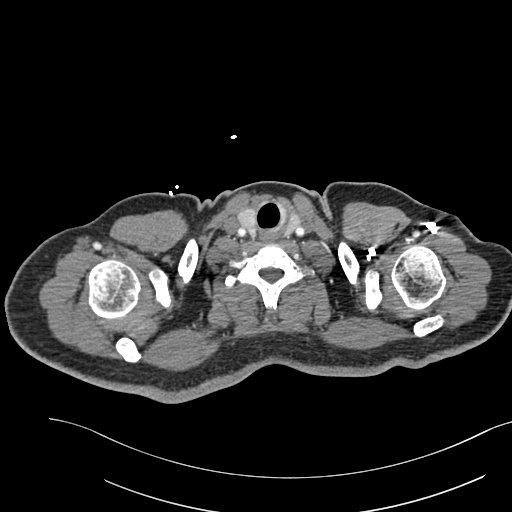

[Series 8: pe coronal mpr · coronal · 0.76mm/px · 2 of 139 slices shown, 3 images]
[im 47/139  soft-tissue]
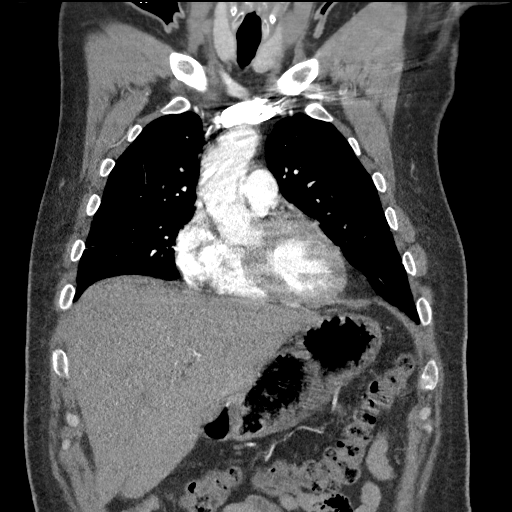
[im 47/139  bone]
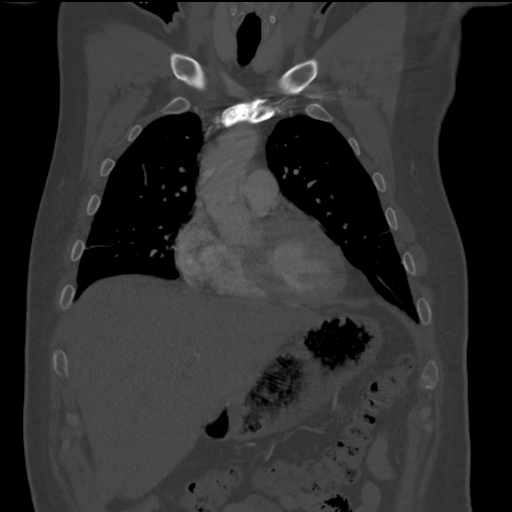
[im 93/139  soft-tissue]
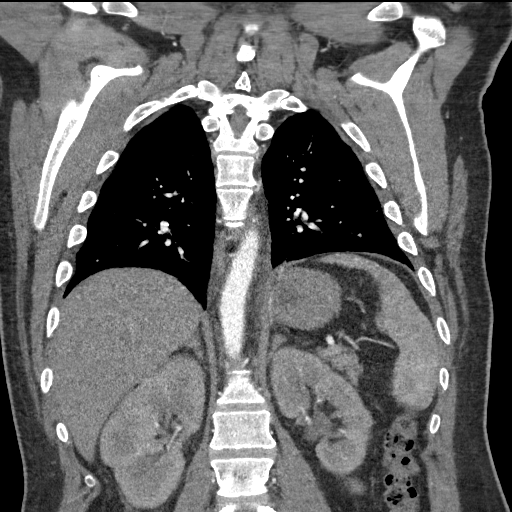

[Series 12: axial st · axial · 0.96mm/px · z∈[-565,-370]mm · 2 of 119 slices shown, 5 images]
[im 40/119  soft-tissue]
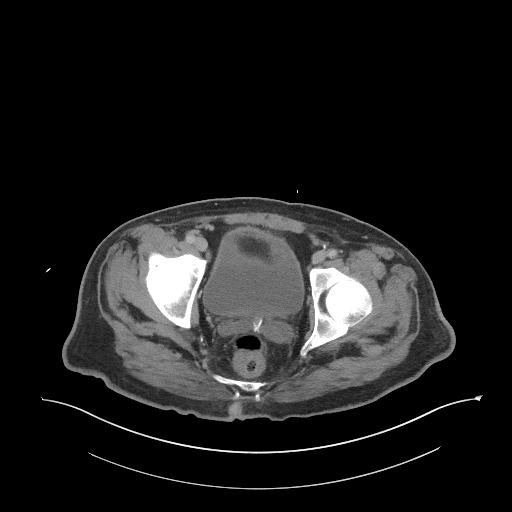
[im 40/119  lung]
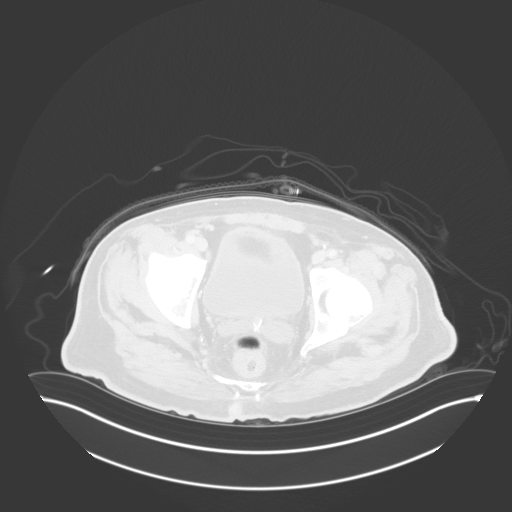
[im 40/119  bone]
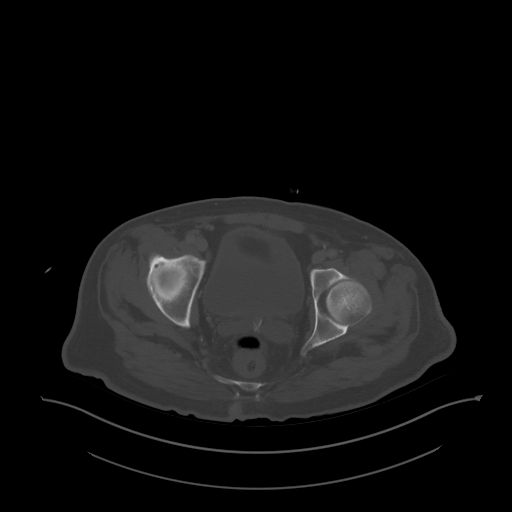
[im 79/119  soft-tissue]
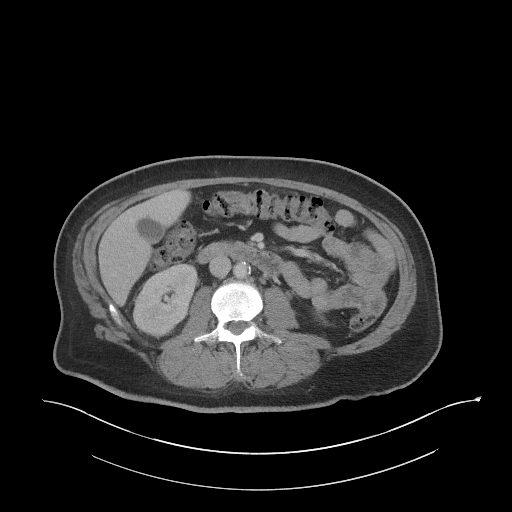
[im 79/119  lung]
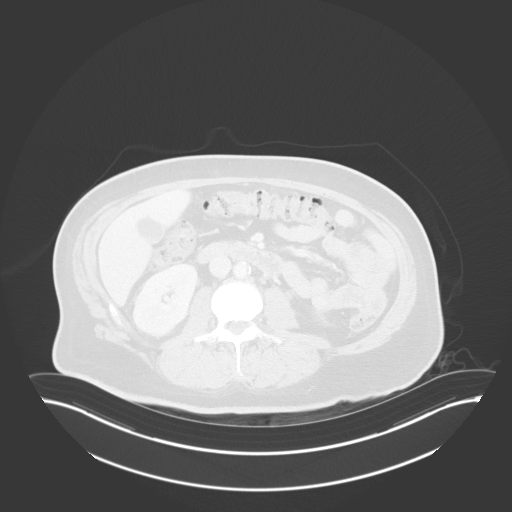

[14 of 46 positions shown; findings below may reference images not displayed]

FINDINGS: CTA CHEST FINDINGS

Cardiovascular: Satisfactory opacification of the pulmonary arteries
to the segmental level. No evidence of pulmonary embolism. Normal
heart size. No pericardial effusion.

Mediastinum/Nodes: No enlarged mediastinal, hilar, or axillary lymph
nodes. Thyroid gland, trachea, and esophagus demonstrate no
significant findings.

Lungs/Pleura: No pneumothorax is noted. Minimal left pleural
effusion is noted with adjacent left subsegmental atelectasis. Right
lung is unremarkable.

Musculoskeletal: There is noted nearly complete lytic destruction of
the T9 vertebral body with adjacent lytic destruction of the
inferior endplate of T8 and superior endplate of T 10. These
findings are most consistent with a combination of diskitis and
osteomyelitis at the T8-9 and T10 levels. MRI is recommended for
further evaluation.

Review of the MIP images confirms the above findings.

CT ABDOMEN and PELVIS FINDINGS

Hepatobiliary: No focal liver abnormality is seen. No gallstones,
gallbladder wall thickening, or biliary dilatation.

Pancreas: Unremarkable. No pancreatic ductal dilatation or
surrounding inflammatory changes.

Spleen: Normal in size without focal abnormality.

Adrenals/Urinary Tract: Adrenal glands are unremarkable. Kidneys are
normal, without renal calculi, focal lesion, or hydronephrosis.
Bladder is unremarkable.

Stomach/Bowel: The stomach appears normal. There is no evidence of
abnormal bowel dilatation. The appendix is unremarkable. However,
there is seen focal circumferential wall thickening of the rectum
concerning for inflammation or possibly malignancy. Sigmoidoscopy is
recommended for further evaluation.

Vascular/Lymphatic: Aortic atherosclerosis. No enlarged abdominal or
pelvic lymph nodes.

Reproductive: Prostate is unremarkable.

Other: No abdominal wall hernia or abnormality. No abdominopelvic
ascites.

Musculoskeletal: No acute or significant osseous findings.

Review of the MIP images confirms the above findings.
IMPRESSION: Findings consistent with osteomyelitis and discitis at T8-9 and
T9-10, with nearly complete lytic destruction of T9 vertebral body
secondary to osteomyelitis. Further evaluation with MRI is
recommended.

Focal circumferential wall thickening of the rectum is noted
concerning for inflammation or possibly malignancy. Sigmoidoscopy is
recommended for further evaluation.

Minimal left pleural effusion is noted with adjacent subsegmental
atelectasis.

No definite evidence of pulmonary embolus.

Aortic Atherosclerosis ([NX]-[NX]).

## 2019-04-13 IMAGING — CT CT ANGIO CHEST
4 of 8 series · 15 of 36 positions shown · IV contrast (Omnipaque)
Comparison: [DATE].

CLINICAL DATA: Sepsis.

EXAM:
CT ANGIOGRAPHY CHEST
CT ABDOMEN AND PELVIS WITH CONTRAST
TECHNIQUE: Multidetector CT imaging of the chest was performed using the
standard protocol during bolus administration of intravenous
contrast. Multiplanar CT image reconstructions and MIPs were
obtained to evaluate the vascular anatomy. Multidetector CT imaging
of the abdomen and pelvis was performed using the standard protocol
during bolus administration of intravenous contrast.
CONTRAST:  100mL OMNIPAQUE IOHEXOL 350 MG/ML SOLN

[Series 5: pe axial st · axial · 0.86mm/px · z∈[-292,-100]mm · 3 of 130 slices shown]
[im 33/130  lung]
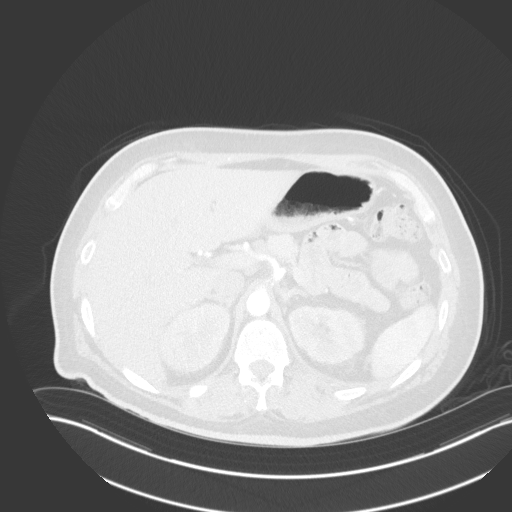
[im 65/130  lung]
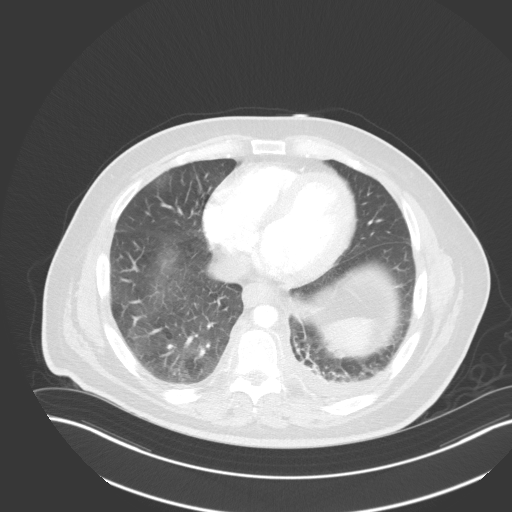
[im 97/130  lung]
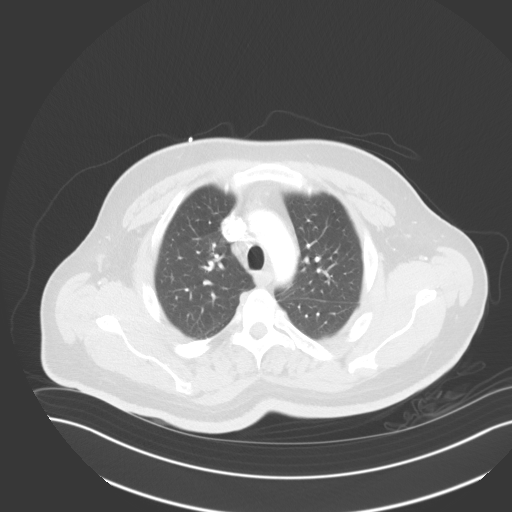

[Series 6: pe thins · axial · 0.86mm/px · z∈[-364,-26]mm · 8 of 388 slices shown]
[im 25/388  lung]
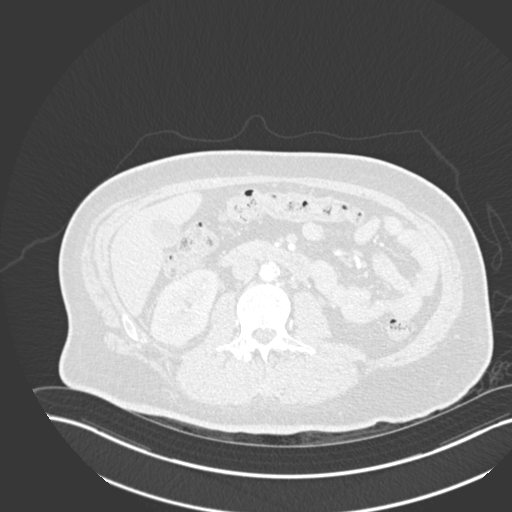
[im 73/388  lung]
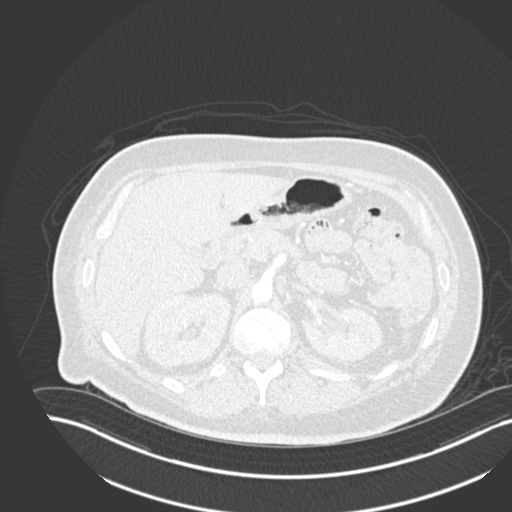
[im 121/388  lung]
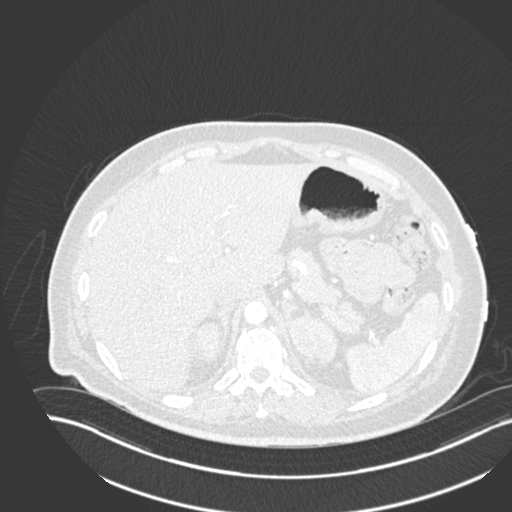
[im 170/388  lung]
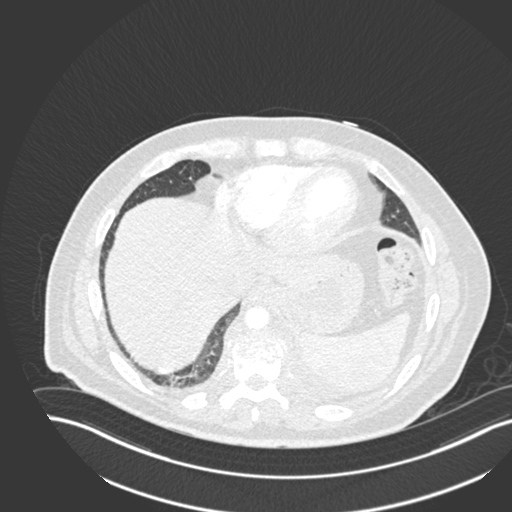
[im 218/388  lung]
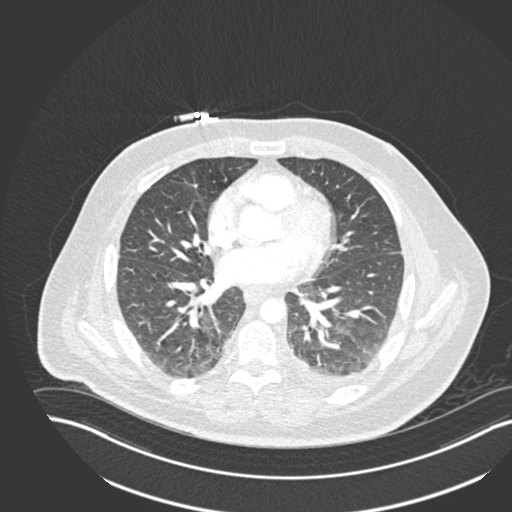
[im 267/388  lung]
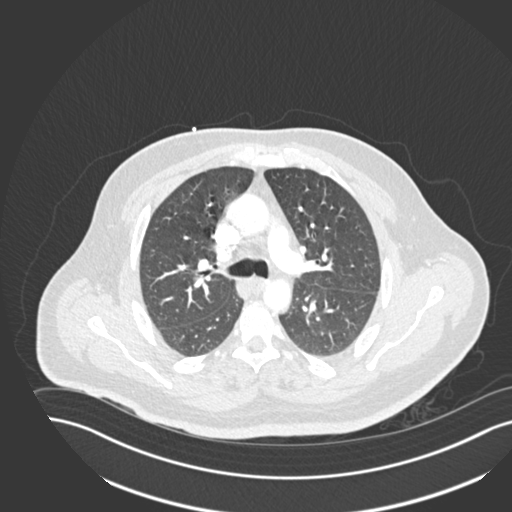
[im 315/388  lung]
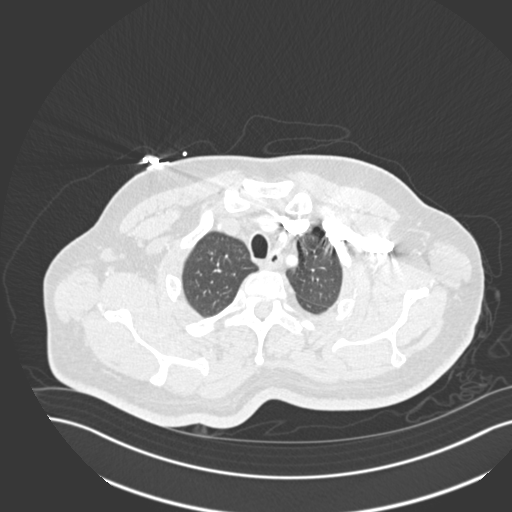
[im 363/388  lung]
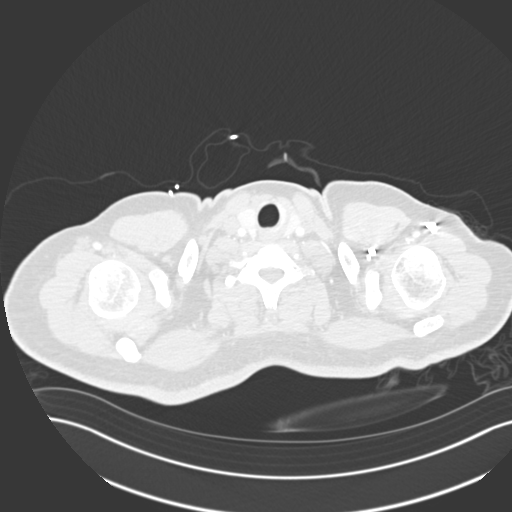

[Series 8: pe coronal mpr · coronal · 0.76mm/px · 1 of 139 slices shown]
[im 70/139  mediastinal]
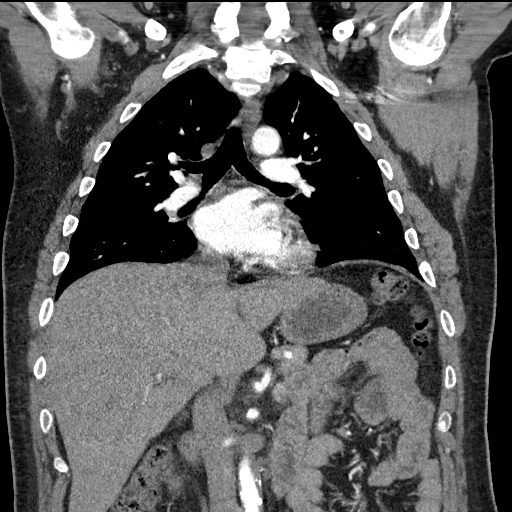

[Series 12: axial st · axial · 0.96mm/px · z∈[-615,-320]mm · 3 of 119 slices shown]
[im 30/119  lung]
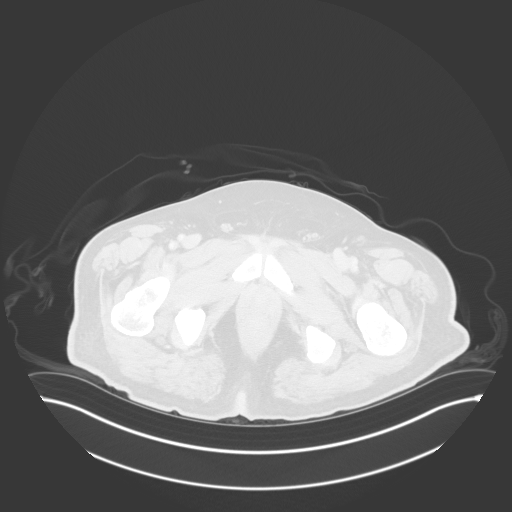
[im 60/119  mediastinal]
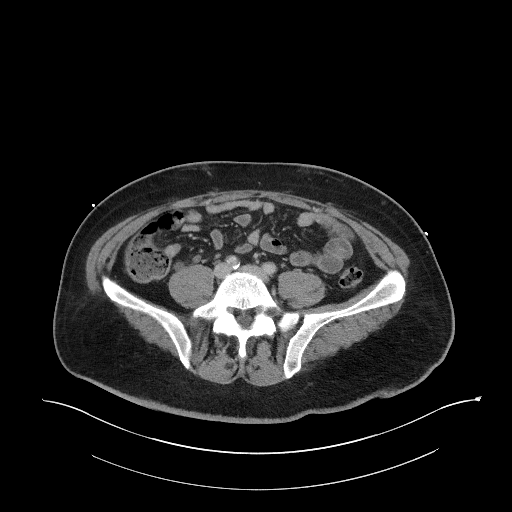
[im 89/119  lung]
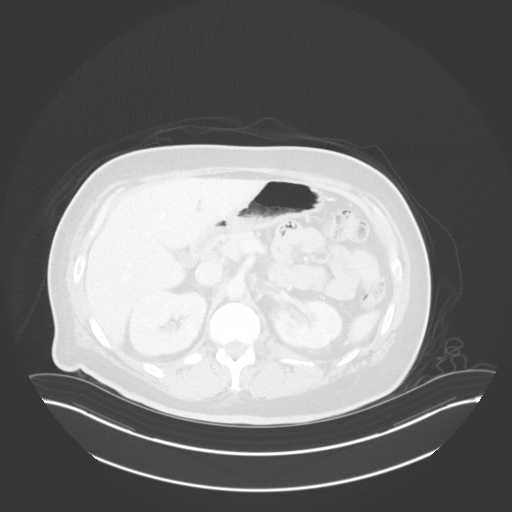

[15 of 36 positions shown; findings below may reference images not displayed]

FINDINGS: CTA CHEST FINDINGS

Cardiovascular: Satisfactory opacification of the pulmonary arteries
to the segmental level. No evidence of pulmonary embolism. Normal
heart size. No pericardial effusion.

Mediastinum/Nodes: No enlarged mediastinal, hilar, or axillary lymph
nodes. Thyroid gland, trachea, and esophagus demonstrate no
significant findings.

Lungs/Pleura: No pneumothorax is noted. Minimal left pleural
effusion is noted with adjacent left subsegmental atelectasis. Right
lung is unremarkable.

Musculoskeletal: There is noted nearly complete lytic destruction of
the T9 vertebral body with adjacent lytic destruction of the
inferior endplate of T8 and superior endplate of T 10. These
findings are most consistent with a combination of diskitis and
osteomyelitis at the T8-9 and T10 levels. MRI is recommended for
further evaluation.

Review of the MIP images confirms the above findings.

CT ABDOMEN and PELVIS FINDINGS

Hepatobiliary: No focal liver abnormality is seen. No gallstones,
gallbladder wall thickening, or biliary dilatation.

Pancreas: Unremarkable. No pancreatic ductal dilatation or
surrounding inflammatory changes.

Spleen: Normal in size without focal abnormality.

Adrenals/Urinary Tract: Adrenal glands are unremarkable. Kidneys are
normal, without renal calculi, focal lesion, or hydronephrosis.
Bladder is unremarkable.

Stomach/Bowel: The stomach appears normal. There is no evidence of
abnormal bowel dilatation. The appendix is unremarkable. However,
there is seen focal circumferential wall thickening of the rectum
concerning for inflammation or possibly malignancy. Sigmoidoscopy is
recommended for further evaluation.

Vascular/Lymphatic: Aortic atherosclerosis. No enlarged abdominal or
pelvic lymph nodes.

Reproductive: Prostate is unremarkable.

Other: No abdominal wall hernia or abnormality. No abdominopelvic
ascites.

Musculoskeletal: No acute or significant osseous findings.

Review of the MIP images confirms the above findings.
IMPRESSION: Findings consistent with osteomyelitis and discitis at T8-9 and
T9-10, with nearly complete lytic destruction of T9 vertebral body
secondary to osteomyelitis. Further evaluation with MRI is
recommended.

Focal circumferential wall thickening of the rectum is noted
concerning for inflammation or possibly malignancy. Sigmoidoscopy is
recommended for further evaluation.

Minimal left pleural effusion is noted with adjacent subsegmental
atelectasis.

No definite evidence of pulmonary embolus.

Aortic Atherosclerosis ([NX]-[NX]).

## 2019-04-13 MED ORDER — VANCOMYCIN HCL IN DEXTROSE 1-5 GM/200ML-% IV SOLN
1000.0000 mg | Freq: Once | INTRAVENOUS | Status: DC
  Filled 2019-04-13: qty 200

## 2019-04-13 MED ORDER — MORPHINE SULFATE (PF) 4 MG/ML IV SOLN
4.0000 mg | Freq: Once | INTRAVENOUS | Status: AC
  Administered 2019-04-13: 4 mg via INTRAVENOUS
  Filled 2019-04-13: qty 1

## 2019-04-13 MED ORDER — METRONIDAZOLE IN NACL 5-0.79 MG/ML-% IV SOLN
500.0000 mg | Freq: Once | INTRAVENOUS | Status: AC
  Administered 2019-04-13: 15:00:00 500 mg via INTRAVENOUS
  Filled 2019-04-13: qty 100

## 2019-04-13 MED ORDER — FENTANYL CITRATE (PF) 100 MCG/2ML IJ SOLN
50.0000 ug | Freq: Once | INTRAMUSCULAR | Status: AC
  Administered 2019-04-13: 15:00:00 50 ug via INTRAVENOUS
  Filled 2019-04-13: qty 2

## 2019-04-13 MED ORDER — SODIUM CHLORIDE 0.9 % IV SOLN
2.0000 g | Freq: Once | INTRAVENOUS | Status: AC
  Administered 2019-04-13: 15:00:00 2 g via INTRAVENOUS
  Filled 2019-04-13: qty 2

## 2019-04-13 MED ORDER — SODIUM CHLORIDE 0.9 % IV BOLUS
1000.0000 mL | Freq: Once | INTRAVENOUS | Status: AC
  Administered 2019-04-13: 13:00:00 1000 mL via INTRAVENOUS

## 2019-04-13 MED ORDER — SODIUM CHLORIDE 0.9 % IV SOLN
2.0000 g | Freq: Three times a day (TID) | INTRAVENOUS | Status: DC
  Administered 2019-04-13 – 2019-04-14 (×2): 2 g via INTRAVENOUS
  Filled 2019-04-13 (×4): qty 2

## 2019-04-13 MED ORDER — FENTANYL CITRATE (PF) 100 MCG/2ML IJ SOLN: 50.0000 ug | Freq: Once | INTRAMUSCULAR | Status: DC

## 2019-04-13 MED ORDER — ACETAMINOPHEN 325 MG PO TABS
650.0000 mg | ORAL_TABLET | ORAL | Status: DC | PRN
  Administered 2019-04-16 – 2019-04-28 (×6): 650 mg via ORAL
  Filled 2019-04-13 (×23): qty 2

## 2019-04-13 MED ORDER — SODIUM CHLORIDE 0.9 % IV SOLN
INTRAVENOUS | Status: DC | PRN
  Administered 2019-04-13 (×2): 250 mL via INTRAVENOUS

## 2019-04-13 MED ORDER — LOVASTATIN 40 MG PO TABS: 40.0000 mg | ORAL_TABLET | Freq: Every day | ORAL | 1 refills | Status: DC

## 2019-04-13 MED ORDER — VANCOMYCIN HCL IN DEXTROSE 1-5 GM/200ML-% IV SOLN
1000.0000 mg | Freq: Two times a day (BID) | INTRAVENOUS | Status: DC
  Administered 2019-04-13 – 2019-04-14 (×2): 1000 mg via INTRAVENOUS
  Filled 2019-04-13 (×2): qty 200

## 2019-04-13 MED ORDER — LOVASTATIN 40 MG PO TABS: 40.0000 mg | ORAL_TABLET | Freq: Every day | ORAL | 3 refills | Status: DC

## 2019-04-13 MED ORDER — APIXABAN 5 MG PO TABS: 5.0000 mg | ORAL_TABLET | Freq: Every day | ORAL | 1 refills | Status: DC

## 2019-04-13 MED ORDER — SODIUM CHLORIDE 0.9 % IV BOLUS (SEPSIS)
1000.0000 mL | Freq: Once | INTRAVENOUS | Status: AC
  Administered 2019-04-13: 14:00:00 1000 mL via INTRAVENOUS

## 2019-04-13 MED ORDER — HYDROMORPHONE HCL 1 MG/ML IJ SOLN
1.0000 mg | Freq: Once | INTRAMUSCULAR | Status: AC
  Administered 2019-04-13: 1 mg via INTRAVENOUS
  Filled 2019-04-13: qty 1

## 2019-04-13 MED ORDER — ACETAMINOPHEN 325 MG PO TABS
650.0000 mg | ORAL_TABLET | Freq: Once | ORAL | Status: AC
  Administered 2019-04-13: 13:00:00 650 mg via ORAL
  Filled 2019-04-13: qty 2

## 2019-04-13 MED ORDER — IBUPROFEN 200 MG PO TABS
800.0000 mg | ORAL_TABLET | Freq: Four times a day (QID) | ORAL | Status: DC | PRN
  Administered 2019-05-02: 17:00:00 800 mg via ORAL
  Filled 2019-04-13: qty 4

## 2019-04-13 MED ORDER — SODIUM CHLORIDE 0.9% FLUSH
3.0000 mL | Freq: Once | INTRAVENOUS | Status: DC
  Filled 2019-04-13: qty 3

## 2019-04-13 MED ORDER — OXYCODONE-ACETAMINOPHEN 5-325 MG PO TABS
1.0000 | ORAL_TABLET | Freq: Once | ORAL | Status: AC
  Administered 2019-04-13: 1 via ORAL
  Filled 2019-04-13: qty 1

## 2019-04-13 MED ORDER — SODIUM CHLORIDE 0.9 % IV SOLN
INTRAVENOUS | Status: DC | PRN
  Administered 2019-04-13 (×2): 250 mL via INTRAVENOUS

## 2019-04-13 MED ORDER — ONDANSETRON HCL 4 MG/2ML IJ SOLN
4.0000 mg | Freq: Once | INTRAMUSCULAR | Status: AC
  Administered 2019-04-13: 13:00:00 4 mg via INTRAVENOUS
  Filled 2019-04-13: qty 2

## 2019-04-13 MED ORDER — SODIUM CHLORIDE 0.9 % IV BOLUS (SEPSIS)
1000.0000 mL | Freq: Once | INTRAVENOUS | Status: AC
  Administered 2019-04-13: 1000 mL via INTRAVENOUS

## 2019-04-13 MED ORDER — RIVAROXABAN 20 MG PO TABS: 20.0000 mg | ORAL_TABLET | Freq: Every day | ORAL | 1 refills | Status: DC

## 2019-04-13 MED ORDER — LACTATED RINGERS IV SOLN
INTRAVENOUS | Status: DC
  Administered 2019-04-13: 17:00:00 1000 mL via INTRAVENOUS

## 2019-04-13 MED ORDER — IOHEXOL 350 MG/ML SOLN
100.0000 mL | Freq: Once | INTRAVENOUS | Status: AC | PRN
  Administered 2019-04-13: 100 mL via INTRAVENOUS

## 2019-04-13 NOTE — ED Notes (Signed)
Pt unable to void at this time. 

## 2019-04-13 NOTE — Progress Notes (Signed)
Subjective:    Patient ID: Thomas [name], male    DOB: 1975-11-25, 43 y.o.   MRN: [id number]  HPI  Pt states he  2 weeks ago he was dizzy in the shower and fell down. He landed on his rt posterior back.   Pt also has history of osteomyelitis and was admitted for various weeks.  He finished iv antibiotics.  He feels very weak and dizzy. His friend had to help him stand. Hiss bp is low, pulse mild high but appears to have trigemeny on ekg.    Some recent insomnia.  Pt also has hx of DVT of rt lower calf area. He is about to run out of eliquis.   Review of Systems  Constitutional: Positive for fatigue. Negative for chills and fever.  Respiratory: Negative for apnea, cough, chest tightness and wheezing.   Cardiovascular: Negative for chest pain and palpitations.  Gastrointestinal: Negative for abdominal pain, constipation and nausea.  Musculoskeletal: Negative for back pain.  Skin: Negative for rash.  Neurological: Positive for dizziness and weakness. Negative for seizures, speech difficulty and light-headedness.  Hematological: Negative for adenopathy. Does not bruise/bleed easily.  Psychiatric/Behavioral: Negative for behavioral problems, decreased concentration and suicidal ideas. The patient is nervous/anxious.     Past Medical History:  Diagnosis Date  . Depression 01/27/2015  . Diabetes mellitus without complication (HCC)   . Hyperlipidemia   . Hypertension      Social History   Socioeconomic History  . Marital status: Single    Spouse name: Not on file  . Number of children: Not on file  . Years of education: Not on file  . Highest education level: Not on file  Occupational History  . Not on file  Tobacco Use  . Smoking status: Current Every Day Smoker  . Smokeless tobacco: Never Used  Substance and Sexual Activity  . Alcohol use: No    Alcohol/week: 0.0 standard drinks  . Drug use: No  . Sexual activity: Not on file  Other Topics Concern  . Not on  file  Social History Narrative  . Not on file   Social Determinants of Health   Financial Resource Strain:   . Difficulty of Paying Living Expenses: Not on file  Food Insecurity:   . Worried About Programme researcher, broadcasting/film/video in the Last Year: Not on file  . Ran Out of Food in the Last Year: Not on file  Transportation Needs:   . Lack of Transportation (Medical): Not on file  . Lack of Transportation (Non-Medical): Not on file  Physical Activity:   . Days of Exercise per Week: Not on file  . Minutes of Exercise per Session: Not on file  Stress:   . Feeling of Stress : Not on file  Social Connections:   . Frequency of Communication with Friends and Family: Not on file  . Frequency of Social Gatherings with Friends and Family: Not on file  . Attends Religious Services: Not on file  . Active Member of Clubs or Organizations: Not on file  . Attends Banker Meetings: Not on file  . Marital Status: Not on file  Intimate Partner Violence:   . Fear of Current or Ex-Partner: Not on file  . Emotionally Abused: Not on file  . Physically Abused: Not on file  . Sexually Abused: Not on file    Past Surgical History:  Procedure Laterality Date  . kidney stones  2009    No family history on file.  No Known Allergies  Current Outpatient Medications on File Prior to Visit  Medication Sig Dispense Refill  . gabapentin (NEURONTIN) 100 MG capsule TAKE ONE CAPSULE BY MOUTH AT BEDTIME 30 capsule 0  . hydrochlorothiazide (HYDRODIURIL) 25 MG tablet TAKE ONE TABLET BY MOUTH DAILY 30 tablet 0  . Insulin Glargine (LANTUS SOLOSTAR) 100 UNIT/ML Solostar Pen 31 units into skin q hs 5 pen 3  . lisinopril (ZESTRIL) 20 MG tablet TAKE ONE TABLET BY MOUTH DAILY 30 tablet 0  . metFORMIN (GLUCOPHAGE-XR) 500 MG 24 hr tablet TAKE TWO TABLETS BY MOUTH TWICE A DAY 120 tablet 0  . oxyCODONE-acetaminophen (PERCOCET/ROXICET) 5-325 MG tablet Take 1 tablet by mouth every 6 (six) hours as needed for severe  pain. 12 tablet 0   No current facility-administered medications on file prior to visit.    BP 91/66 (BP Location: Left Arm, Patient Position: Sitting, Cuff Size: Normal)   Pulse (!) 125   Temp (!) 96 F (35.6 C) (Temporal)   Resp 18   Wt 186 lb 12.8 oz (84.7 kg)   SpO2 100%   BMI 26.80 kg/m       Objective:   Physical Exam  General- No acute distress. Pleasant patient. Appears to be fatigued.   Neck- Full range of motion, no jvd Lungs- Clear, even and unlabored. Heart- regular rate and rhythm. Neurologic- CNII- XII grossly intact. Lower ext- prior wounds area of infection healed. No pedal edema.       Assessment & Plan:  With your severe fatigue, dizziness, difficulty standing, hypotension, tachcardia with trigeminy, I do want you to be seen in the ED now.  I called downstairs to let them know you are coming down.  ED can order chest xray and rib series to see if you fractured your rt posterior rib on fall 2 weeks ago.  For dvt history, Continue eliquis.(if too expensive will try to rx xarelto) or maybe just give 15 mg sample which we have here but is not therapeutic as should be on 20 mg daily for dvt). Will also put in referral to hematologist to get opinion on duration   Follow up date to be determined after ED visit.  40 minutes spent with patient.  50% of time spent with patient on plan going forward.  In addition did call down and speak with emergency department physician and explained ED MD patient's recent presentation.

## 2019-04-13 NOTE — ED Notes (Signed)
Vancomycin delayed so CTA can be performed.

## 2019-04-13 NOTE — ED Notes (Signed)
ED Provider at bedside. 

## 2019-04-13 NOTE — ED Provider Notes (Signed)
[facility] EMERGENCY DEPARTMENT Provider Note   CSN: [id number] Arrival date & time: 04/13/19  1217     History Chief Complaint  Patient presents with  . Dizziness    Thomas [name] is a 43 y.o. male.  The history is provided by the patient, medical records and a relative. The history is limited by a language barrier.  Back Pain Location:  Thoracic spine Quality:  Aching and stabbing Radiates to:  Does not radiate Pain severity:  Severe Onset quality:  Gradual Duration:  1 week Timing:  Constant Progression:  Worsening Chronicity:  New Context: recent illness (recent endocarditis)   Relieved by:  Nothing Worsened by:  Palpation Ineffective treatments:  None tried Associated symptoms: chest pain and fever   Associated symptoms: no abdominal pain, no abdominal swelling, no bowel incontinence, no dysuria, no leg pain, no numbness, no tingling and no weakness        Past Medical History:  Diagnosis Date  . Depression 01/27/2015  . Diabetes mellitus without complication ([place])   . Hyperlipidemia   . Hypertension     Patient Active Problem List   Diagnosis Date Noted  . Diabetes mellitus type II, uncontrolled ([place]) 01/27/2015  . Diabetic neuropathy ([place]) 01/27/2015  . HTN (hypertension) 01/27/2015  . Depression 01/27/2015  . Hyperlipidemia 01/27/2015    Past Surgical History:  Procedure Laterality Date  . kidney stones  2009       No family history on file.  Social History   Tobacco Use  . Smoking status: Former Research scientist (life sciences)  . Smokeless tobacco: Never Used  Substance Use Topics  . Alcohol use: No    Alcohol/week: 0.0 standard drinks  . Drug use: No    Home Medications Prior to Admission medications   Medication Sig Start Date End Date Taking? Authorizing Provider  apixaban (ELIQUIS) 5 MG TABS tablet Take 1 tablet (5 mg total) by mouth daily. 04/13/19 08/11/19  Saguier, Percell Miller, PA-C  gabapentin (NEURONTIN) 100 MG capsule TAKE ONE CAPSULE BY  MOUTH AT BEDTIME 01/27/19   Saguier, Percell Miller, PA-C  hydrochlorothiazide (HYDRODIURIL) 25 MG tablet TAKE ONE TABLET BY MOUTH DAILY 01/27/19   Saguier, Percell Miller, PA-C  Insulin Glargine (LANTUS SOLOSTAR) 100 UNIT/ML Solostar Pen 31 units into skin q hs 04/03/19   Saguier, Percell Miller, PA-C  lisinopril (ZESTRIL) 20 MG tablet TAKE ONE TABLET BY MOUTH DAILY 01/27/19   Saguier, Percell Miller, PA-C  lovastatin (MEVACOR) 40 MG tablet Take 1 tablet (40 mg total) by mouth at bedtime. 04/13/19   Saguier, Percell Miller, PA-C  metFORMIN (GLUCOPHAGE-XR) 500 MG 24 hr tablet TAKE TWO TABLETS BY MOUTH TWICE A DAY 01/27/19   Saguier, Percell Miller, PA-C  oxyCODONE-acetaminophen (PERCOCET/ROXICET) 5-325 MG tablet Take 1 tablet by mouth every 6 (six) hours as needed for severe pain. 03/25/19   Long, Wonda Olds, MD  rivaroxaban (XARELTO) 20 MG TABS tablet Take 1 tablet (20 mg total) by mouth daily with supper. 04/13/19   Saguier, Percell Miller, PA-C    Allergies    Patient has no known allergies.  Review of Systems   Review of Systems  Constitutional: Positive for chills, fatigue and fever. Negative for diaphoresis.  HENT: Negative for congestion.   Respiratory: Negative for chest tightness, shortness of breath and wheezing.   Cardiovascular: Positive for chest pain. Negative for palpitations and leg swelling.  Gastrointestinal: Negative for abdominal pain, bowel incontinence, constipation, diarrhea, nausea and vomiting.  Genitourinary: Positive for flank pain. Negative for dysuria and frequency.       Foul  smelling urine   Musculoskeletal: Positive for back pain. Negative for neck pain and neck stiffness.  Neurological: Negative for tingling, weakness and numbness.  Psychiatric/Behavioral: Negative for agitation and confusion.  All other systems reviewed and are negative.   Physical Exam Updated Vital Signs BP 92/65 (BP Location: Left Arm)   Pulse (!) 131   Temp 98.2 F (36.8 C) (Oral)   Resp 20   SpO2 100%   Physical Exam Vitals and  nursing note reviewed.  Constitutional:      General: He is not in acute distress.    Appearance: He is ill-appearing. He is not toxic-appearing or diaphoretic.  HENT:     Head: Normocephalic.     Nose: Nose normal. No congestion or rhinorrhea.     Mouth/Throat:     Mouth: Mucous membranes are moist.     Pharynx: No oropharyngeal exudate or posterior oropharyngeal erythema.  Eyes:     Pupils: Pupils are equal, round, and reactive to light.  Cardiovascular:     Rate and Rhythm: Tachycardia present.     Pulses: Normal pulses.     Heart sounds: No murmur.  Pulmonary:     Effort: Pulmonary effort is normal. Tachypnea present.     Breath sounds: Rhonchi present. No wheezing or rales.  Chest:     Chest wall: No tenderness.  Abdominal:     General: Abdomen is flat. There is no distension.     Tenderness: There is no abdominal tenderness.  Musculoskeletal:        General: Tenderness and signs of injury present.     Cervical back: No tenderness.       Back:     Right lower leg: No edema.     Left lower leg: No edema.  Skin:    General: Skin is warm.     Capillary Refill: Capillary refill takes less than 2 seconds.     Findings: No erythema or rash.  Neurological:     General: No focal deficit present.     Mental Status: He is alert.  Psychiatric:        Mood and Affect: Mood normal.     ED Results / Procedures / Treatments   Labs (all labs ordered are listed, but only abnormal results are displayed) Labs Reviewed  BASIC METABOLIC PANEL - Abnormal; Notable for the following components:      Result Value   Sodium 133 (*)    Chloride 97 (*)    Glucose, Bld 267 (*)    BUN 26 (*)    Creatinine, Ser 1.37 (*)    All other components within normal limits  CBC - Abnormal; Notable for the following components:   WBC 15.2 (*)    RBC 3.84 (*)    Hemoglobin 9.4 (*)    HCT 30.7 (*)    MCV 79.9 (*)    MCH 24.5 (*)    Platelets 647 (*)    All other components within normal  limits  LACTIC ACID, PLASMA - Abnormal; Notable for the following components:   Lactic Acid, Venous 2.0 (*)    All other components within normal limits  HEPATIC FUNCTION PANEL - Abnormal; Notable for the following components:   Total Protein 8.8 (*)    Albumin 3.0 (*)    AST 14 (*)    All other components within normal limits  APTT - Abnormal; Notable for the following components:   aPTT 43 (*)    All other components within normal  limits  PROTIME-INR - Abnormal; Notable for the following components:   Prothrombin Time 16.1 (*)    INR 1.3 (*)    All other components within normal limits  CBG MONITORING, ED - Abnormal; Notable for the following components:   Glucose-Capillary 252 (*)    All other components within normal limits  SARS CORONAVIRUS 2 AG (30 MIN TAT)  URINE CULTURE  CULTURE, BLOOD (ROUTINE X 2)  CULTURE, BLOOD (ROUTINE X 2)  SARS CORONAVIRUS 2 (TAT 6-24 HRS)  LACTIC ACID, PLASMA  LIPASE, BLOOD  URINALYSIS, ROUTINE W REFLEX MICROSCOPIC  TROPONIN I (HIGH SENSITIVITY)  TROPONIN I (HIGH SENSITIVITY)    EKG EKG Interpretation  Date/Time:  Tuesday April 13 2019 12:32:09 EST Ventricular Rate:  134 PR Interval:  128 QRS Duration: 88 QT Interval:  298 QTC Calculation: 445 R Axis:   58 Text Interpretation: Sinus tachycardia T wave abnormality, consider inferior ischemia Abnormal ECG When comapred to prior, faster rate and new t wave inversion in lead 3. No STEMI Confirmed by Theda Belfast ([id number]) on 04/13/2019 12:36:14 PM   Radiology DG Chest Portable 1 View  Result Date: 04/13/2019 CLINICAL DATA:  43 year old male with a history of chills and recent endocarditis EXAM: PORTABLE CHEST 1 VIEW COMPARISON:  02/24/2019, 02/22/2011 FINDINGS: Cardiomediastinal silhouette unchanged in size contour. Questionable reticulonodular opacity overlying the left cardiac border. No pneumothorax. No pleural effusion. IMPRESSION: Questionable reticulonodular opacity overlying the  cardiac silhouette and the left heart border, potentially developing infection. Further evaluation with a formal PA and lateral chest x-ray may be useful, or alternatively CT. Electronically Signed   By: Gilmer Mor D.O.   On: 04/13/2019 14:34    Procedures Procedures (including critical care time)  CRITICAL CARE Performed by: Canary Brim Abdalla Naramore Total critical care time: 45 minutes Critical care time was exclusive of separately billable procedures and treating other patients. Critical care was necessary to treat or prevent imminent or life-threatening deterioration. Critical care was time spent personally by me on the following activities: development of treatment plan with patient and/or surrogate as well as nursing, discussions with consultants, evaluation of patient's response to treatment, examination of patient, obtaining history from patient or surrogate, ordering and performing treatments and interventions, ordering and review of laboratory studies, ordering and review of radiographic studies, pulse oximetry and re-evaluation of patient's condition.  Thomas [name] was evaluated in Emergency Department on 04/13/2019 for the symptoms described in the history of present illness. He was evaluated in the context of the global COVID-19 pandemic, which necessitated consideration that the patient might be at risk for infection with the SARS-CoV-2 virus that causes COVID-19. Institutional protocols and algorithms that pertain to the evaluation of patients at risk for COVID-19 are in a state of rapid change based on information released by regulatory bodies including the CDC and federal and state organizations. These policies and algorithms were followed during the patient's care in the ED.    Medications Ordered in ED Medications  sodium chloride flush (NS) 0.9 % injection 3 mL (3 mLs Intravenous Not Given 04/13/19 1241)  vancomycin (VANCOCIN) IVPB 1000 mg/200 mL premix (has no  administration in time range)  ceFEPIme (MAXIPIME) 2 g in sodium chloride 0.9 % 100 mL IVPB (has no administration in time range)  0.9 %  sodium chloride infusion (250 mLs Intravenous New Bag/Given 04/13/19 1441)  0.9 %  sodium chloride infusion (250 mLs Intravenous New Bag/Given 04/13/19 1433)  iohexol (OMNIPAQUE) 350 MG/ML injection 100 mL (has no administration in time  range)  morphine 4 MG/ML injection 4 mg (has no administration in time range)  sodium chloride 0.9 % bolus 1,000 mL ( Intravenous Stopped 04/13/19 1418)  ondansetron (ZOFRAN) injection 4 mg (4 mg Intravenous Given 04/13/19 1300)  acetaminophen (TYLENOL) tablet 650 mg (650 mg Oral Given 04/13/19 1314)  ceFEPIme (MAXIPIME) 2 g in sodium chloride 0.9 % 100 mL IVPB ( Intravenous Stopped 04/13/19 1505)  metroNIDAZOLE (FLAGYL) IVPB 500 mg (0 mg Intravenous Stopped 04/13/19 1600)  sodium chloride 0.9 % bolus 1,000 mL (0 mLs Intravenous Stopped 04/13/19 1539)    And  sodium chloride 0.9 % bolus 1,000 mL (0 mLs Intravenous Stopped 04/13/19 1539)  fentaNYL (SUBLIMAZE) injection 50 mcg (50 mcg Intravenous Given 04/13/19 1521)    ED Course  I have reviewed the triage vital signs and the nursing notes.  Pertinent labs & imaging results that were available during my care of the patient were reviewed by me and considered in my medical decision making (see chart for details).    MDM Rules/Calculators/A&P                      Domonick Sittner is a 43 y.o. male with a past medical history significant for hypertension, hyperlipidemia, diabetes, prior DVT on Eliquis, and recent endocarditis who presents from PCP upstairs for hypotension, lightheadedness, fatigue, tachycardia, tachypnea, nausea, vomiting, change in urine smell, and right flank/back pain. According to patient, he completed 6 weeks of antibiotics for bacterial endocarditis last week. He says that he started having pain in his right back before stopping the antibiotics. He also  then had a fall landing on his back worsening pain. He reports he has had worsening symptoms and saw his PCP today where he was found to have sinus tachycardia versus trigeminy on the EKG as reported by PCP. He also has had nausea and vomiting and fatigue. He has had chills but no documented fever at home. He has had no constipation or diarrhea but reports his urine has smelled very different and was darker. He denies history of pyelonephritis or kidney stones. PCP was concerned about possible trauma from the fall such as rib fractures. He denies any headache or neck stiffness. No other complaints on arrival.  On arrival to the emergency department, he was found to be tachycardic in the 130s, blood pressure of 92 systolic, and tachypnea. He is maintaining oxygen saturations on room air. On exam, he does have right CVA tenderness and right back tenderness. He did not have midline tenderness on my exam and I did not find any neurologic deficits in his arms or legs. He did not have any abdominal or chest tenderness. I did not appreciate rhonchi or wheezing. I did not hear a murmur.  Clinically based on his recent endocarditis and new back pain tachycardia, tachypnea, and chills, an epidural abscess is considered however with the urinary symptoms and the pain being more in the CVA/side of the right back, I am more inclined to suspect a pyelonephritis causing his symptoms. With a fall and right back tenderness, we will get x-rays to look for rib fractures however patient may end up needing CT imaging to look for both traumatic injury and rule out pulmonary embolism causing his tachycardia and tachypnea and hypotension with his recent DVT. He has not missed any doses of his medication. His oral temperature was not febrile however we will get a rectal temp. If his febrile, anticipate make him a code sepsis. Anticipate he will need  admission after work-up is completed. Rapid Covid test will be sent. He will be given  fluids and nausea medicine initially and when blood pressure improves, will consider pain medication for his discomfort. I have low suspicion for meningitis with lack of any neck pain, neck stiffness, or headache. If other work-up is complete reassuring and he has no evidence of pyelonephritis or UTI or pneumonia, will likely consider that he needs MRI to look for epidural abscess.  Patient's rectal temperature was elevated, he was made a code sepsis for the tachycardia, fever, tachypnea, hypotension.   Patient's work-up also began to return with a negative Covid test.  Patient does have elevated creatinine at 1.37.  Patient has a leukocytosis of 15.  Anemia also present.  Initial troponin is negative.  Lactic acid 2.0.  Lipase not elevated.  Hepatic function reassuring.  Chest x-ray shows a questionable opacity overlying the cardiac silhouette and left heart border concerning for developing infection.  A CT study was recommended.  Given the recent DVT and need of CT, a PE study was ordered to further evaluate.  I am also concerned about possible rib fractures missed on x-ray.  He will have CT of the chest abdomen pelvis with the chest being a PE study.  He is already received broad-spectrum antibiotics.  He will require admission after work-up is completed for either pyelonephritis causing sepsis, pneumonia, sepsis, or other.  Patient still may require MRI to rule out epidural abscess if other work-up is reassuring.  We do not have MRI at this facility at this time.  This will likely to happen after he is admitted if further work-up is unrevealing.   Final Clinical Impression(s) / ED Diagnoses Final diagnoses:  Sepsis, due to unspecified organism, unspecified whether acute organ dysfunction present (HCC)  Acute right-sided thoracic back pain  Foul smelling urine     Clinical Impression: 1. Sepsis, due to unspecified organism, unspecified whether acute organ dysfunction present (HCC)   2. Acute  right-sided thoracic back pain   3. Foul smelling urine     Disposition: Care transferred to oncoming team while awaiting results of CT chest/abdomen/pelvis to look for source of sepsis as well as urinalysis.  Given his recent endocarditis and new back pain, have not yet definitively rule out epidural abscess if other work-up is reassuring.    This note was prepared with assistance of Conservation officer, historic buildingsDragon voice recognition software. Occasional wrong-word or sound-a-like substitutions may have occurred due to the inherent limitations of voice recognition software.      Travelle Mcclimans, Canary Brimhristopher J, MD 04/13/19 [phone]

## 2019-04-13 NOTE — ED Provider Notes (Signed)
43 year old male with history of bacteremia, endocarditis presents to ER with complaint of back pain, fever.  Also with recent fall.  Found to be hypotensive, tachycardic, concern for sepsis.  Also with recent diagnosis of DVT.  CTA chest, CT abdomen pelvis to evaluate for acute PE, rib fractures, epidural abscess, osteomyelitis.  Patient given broad-spectrum antibiotics Vanco, cefepime, Flagyl, fluid bolus.   4:48 PM reviewed CT results, will update patient, will consult hospitalist to [facility], patient will need transfer, MRI to further evaluate, and likely neurosurgical and IR consultation  Discussed with Dr. Posey Pronto who will accept to [facility] to hospitalist  Reviewed with NSGY on call, Dr. Saintclair Halsted, agrees with need for transfer to [facility] or [facility], agrees with broad IV abx for now, agrees with need for MRI, if/when patient gets transferred to [facility], NSGY will need to be consulted to see in person   Lucrezia Starch, MD 04/13/19 2352

## 2019-04-13 NOTE — Progress Notes (Signed)
[facility] Plan of Care Note  Patient: Thomas [name]    OMV:[id number]  PCP: Mackie Pai, PA-C    DOB: January 04, 1976  DOS: 04/13/2019   Received a phone call from Facility: [facility] regarding transfer of [name]. Pt presented with back pain and dizziness, also has some rib cage pain. He was septic on arrival and now better after fluids. Work up shows complete destruction of the T 9 spine and surrounding discitis.  Pt was accepted for admission to tele floor for further work up and treatment of MRSA related sepsis  His recent d/c summary from [facility] system is copied here.   Mr. [name] a 43 year old male originally from [place] with a medical history of type 2 diabetes previously on Metforminand victoza, recently transitioned to lantus with questionable adherence,tobacco use,and PAD (vascular studies done 11/2018 in [place]) whopresented on 01/31/2019 with decreased p.o. intake, fevers,nausea, vomiting, back pain and chest pain. He was found to be in DKA. Glucose is 681.PH 7.1. Gap 34. Bicarb 6. U/A +Ketones. Lactic Acid 2.53. Troponin 23. WBC 25.2.Beta hydroxybutyrate 11.66.Given Fluid bolusesand started on insulin gtt. CT C/A/P with noted bilateral pulmonary nodules, hepatic steatosis, left nephrolithiasis. He was admitted to the ICU. He was also found to be bacteremic with MRSA growing in the blood. Source unclear on presentation however patient did have an area his toe that appearedcellulitic as well as an area on his right back that was erythematous and an abrasion of the lateral R aspect of his shin (warm, TTP).Patient was started on cLinda mycin and vancomycin by the ICU, and later transferred to hospitalist service once insulin gtt was weaned off.  Pt was treated with IV vancomycin for MRSA bacteremia. Source of bacteremia most likely pt's R great toe paronychia with subsequent abscess formation in the R calf and pyomyositis of the L vastus intermedius.  Pt denies any IVDA. HIV negative. (Initially, pt was n/v and ICU team suspected intraabdominal source- [place] abd was negative for cholecystitis. UTI also suspected, but urine cultures were negative. Pt does have a nonobstructive stone on the L kidney.   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 A larger, more focal fluid in the distal vastus intermedius dissects into the vastus lateralis. Small fluid collection anterior to the quad tendon is worrisome for abscess. Negative for osteo." I&D by ortho done on 10/23 of L leg and R leg abscesses. Wound cultures grew MRSA as well.   He had a TTE 10/20 showed no vegetations. EF 65%. However, given patient's persistent bacteremia, he was eventually transitioned from vancomycin to daptomycin and ceftaroline. He underwent TEE on 11/2 to further evaluate for possible valvular vegetation. TEE demonstrated findings suggestive of a mitral valve posterior leaflet small vegetation. 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 His dose was increased on 11/7 due to persistent bacteremia. Cultures cleared on 11/8. Teflaro was discontinued prior to discharge. Patient will need to continue antibiotic (IV daptomycin) treatment through December 20 at the infusion center.   On 11/18, patient had noted new onset of swelling in right lower extremity and imaging was consistent with abscess formation. Patient was taken to operating room for I&D on 11/19. Intraoperative cultures remain negative.  Given patient's persistent back pain, there was some initial concern for a fluid collection on his back therefore CT w contrast  thoracolumbar spine obtained on 10/22 that showed "Neg for paraspinal abscess. Focal areas of decr enhancement in the R kidney- correlate w any clinical  evidence of pyelo. New small pleural effusions and small volume pelvic ascites." Effusions were believed to be iatrogenic from significant volume of IVF boluses needed for resuscitation while he was in DKA.   Due to persistent bacteremia as above, he also underwent MR Brain and MR T/L Spine. 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 Spine surgery was consulted. Recommended outpatient followup in 6 weeks, but no surgical intervention indicated inpatient.   Additional issues this hospitalization include hypokalemia, hypophosphatemia and hypomagnesemia in the setting of patient recovering from DKA. Admitting A1C >15; last A1C 15.2 (11/26/18). After the drip was d/c'd he was continued on Glargine 45 u nightly with SSI. 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 He also had notable anemia which remained stable at Hgb ~8. Iron profile showed anemia of chronic disease. Iron supplementation was held in setting of active bacteremia.   CT scan performed on 11/18 revealed deep venous thrombosis in popliteal vein. Anticoagulation was achieved with Arixtra as patient is unable to tolerate heparin due to porcine component. Patient is being discharged on Eliquis Patient was provided with prescriptions for all his medications under charity care coverage. Patient is to follow-up with transitional care clinic. FOLLOW UP ITEMS:  - Pt noted to have pulmonary nodules (incidental finding) on CT this admission. He is a smoker. Needs follow up with PCP for repeat imaging in 6-12 months.  - Follow up with Orthopedic surgery in 2-3 weeks. - Follow up with Spine Surgery for paraspinal abscesses in 6 weeks (around Mar 22 2019) - Follow up with ID for MRSA endocarditis post discharge.  - Complete antibiotic course through December 20    MRI  SPINE  02/20/2019 IMPRESSION: 1. T9 osteomyelitis with mild endplate erosion or compression since 02/08/2019. But no new infected vertebrae or disc spaces since that time. 2. Generalized dorsal epidural space edema suspected, probably superimposed on chronic thoracic epidural lipomatosis. This results in mild spinal stenosis at T8-T9 and T10-9 T10 with no cord compression. No dural thickening. No discrete epidural abscess. 3. The small right T9 level erector spinae paraspinal muscle abscess has resolved. Persistent right T11 level paraspinal muscle abscess although stable to slightly smaller. Underlying right greater than left generalized paraspinal myositis. 4. Continued moderate to large bilateral pleural effusions.  Plan of care: The patient will be accepted for admission to [facility] hospital, tele unit. Neurosurgery will be consulted by requesting physician prior to transfer. Pt will need MRI spine on arrival, ID consultation and may be IR consultation.   Author: Lynden Oxford, MD [facility] 04/13/2019  If 7PM-7AM, please contact night-coverage at [email or web address],

## 2019-04-13 NOTE — ED Triage Notes (Signed)
Dizziness x 3 days. He was seen by his MD today for back pain and dizziness. He had an EKG that was abnormal and told to come here for further evaluation.

## 2019-04-13 NOTE — Patient Instructions (Addendum)
With your severe fatigue, dizziness, difficulty standing, hypotension, tachcardia with trigeminy, I do want you to be seen in the ED now.  I called downstairs to let them know you are coming down.  ED can order chest xray and rib series to see if you fractured your rt posterior rib on fall 2 weeks ago.  For dvt history, rx xarelto 20 mg. Eliquis too expensive.  Follow up date to be determined after ED visit.

## 2019-04-13 NOTE — Progress Notes (Signed)
Pharmacy Antibiotic Note  Hayes Czaja is a 43 y.o. male admitted on 04/13/2019 with sepsis.  Pharmacy has been consulted for vancomycin and cefepime dosing.  Plan: Vancomycin 1000 mg IV every 12 hours.  Goal = AUC ~ 500 mcg Cefepime 2 grams IV every 8 hours     Temp (24hrs), Avg:98.5 F (36.9 C), Min:96 F (35.6 C), Max:101.3 F (38.5 C)  Recent Labs  Lab 04/13/19 1247  WBC 15.2*  CREATININE 1.37*  LATICACIDVEN 2.0*    Estimated Creatinine Clearance: 72.5 mL/min (A) (by C-G formula based on SCr of 1.37 mg/dL (H)).    No Known Allergies  Antimicrobials this admission: Vancomycin 1 gram Cefepime 2 gram Metronidazole 500 mg  Dose adjustments this admission:   Microbiology results: pending  Thank you for allowing pharmacy to be a part of this patient's care.  Cooper Render Jahmil Macleod 04/13/2019 2:33 PM

## 2019-04-14 ENCOUNTER — Inpatient Hospital Stay (HOSPITAL_COMMUNITY): Payer: Medicaid Other

## 2019-04-14 DIAGNOSIS — I96 Gangrene, not elsewhere classified: Secondary | ICD-10-CM

## 2019-04-14 DIAGNOSIS — E1152 Type 2 diabetes mellitus with diabetic peripheral angiopathy with gangrene: Secondary | ICD-10-CM

## 2019-04-14 DIAGNOSIS — Z8679 Personal history of other diseases of the circulatory system: Secondary | ICD-10-CM

## 2019-04-14 DIAGNOSIS — E1165 Type 2 diabetes mellitus with hyperglycemia: Secondary | ICD-10-CM

## 2019-04-14 DIAGNOSIS — M4624 Osteomyelitis of vertebra, thoracic region: Secondary | ICD-10-CM

## 2019-04-14 DIAGNOSIS — R7881 Bacteremia: Secondary | ICD-10-CM

## 2019-04-14 DIAGNOSIS — Z8614 Personal history of Methicillin resistant Staphylococcus aureus infection: Secondary | ICD-10-CM

## 2019-04-14 DIAGNOSIS — Z87891 Personal history of nicotine dependence: Secondary | ICD-10-CM

## 2019-04-14 DIAGNOSIS — I10 Essential (primary) hypertension: Secondary | ICD-10-CM

## 2019-04-14 DIAGNOSIS — I82439 Acute embolism and thrombosis of unspecified popliteal vein: Secondary | ICD-10-CM | POA: Diagnosis present

## 2019-04-14 DIAGNOSIS — Z872 Personal history of diseases of the skin and subcutaneous tissue: Secondary | ICD-10-CM

## 2019-04-14 DIAGNOSIS — M4644 Discitis, unspecified, thoracic region: Secondary | ICD-10-CM

## 2019-04-14 DIAGNOSIS — M546 Pain in thoracic spine: Secondary | ICD-10-CM

## 2019-04-14 DIAGNOSIS — I82539 Chronic embolism and thrombosis of unspecified popliteal vein: Secondary | ICD-10-CM

## 2019-04-14 DIAGNOSIS — K6289 Other specified diseases of anus and rectum: Secondary | ICD-10-CM | POA: Diagnosis present

## 2019-04-14 LAB — SURGICAL PCR SCREEN
MRSA, PCR: POSITIVE — AB
Staphylococcus aureus: POSITIVE — AB

## 2019-04-14 LAB — BASIC METABOLIC PANEL
Anion gap: 8 (ref 5–15)
BUN: 17 mg/dL (ref 6–20)
CO2: 25 mmol/L (ref 22–32)
Calcium: 9.3 mg/dL (ref 8.9–10.3)
Chloride: 101 mmol/L (ref 98–111)
Creatinine, Ser: 0.84 mg/dL (ref 0.61–1.24)
GFR calc Af Amer: >60 mL/min (ref >60)
GFR calc non Af Amer: >60 mL/min (ref >60)
Glucose, Bld: 200 mg/dL — ABNORMAL HIGH (ref 70–99)
Potassium: 4 mmol/L (ref 3.5–5.1)
Sodium: 134 mmol/L — ABNORMAL LOW (ref 135–145)

## 2019-04-14 LAB — CBC
HCT: 25.1 % — ABNORMAL LOW (ref 39.0–52.0)
Hemoglobin: 7.9 g/dL — ABNORMAL LOW (ref 13.0–17.0)
MCH: 24.5 pg — ABNORMAL LOW (ref 26.0–34.0)
MCHC: 31.5 g/dL (ref 30.0–36.0)
MCV: 78 fL — ABNORMAL LOW (ref 80.0–100.0)
Platelets: 528 10*3/uL — ABNORMAL HIGH (ref 150–400)
RBC: 3.22 MIL/uL — ABNORMAL LOW (ref 4.22–5.81)
RDW: 14.3 % (ref 11.5–15.5)
WBC: 9.2 10*3/uL (ref 4.0–10.5)
nRBC: 0 % (ref 0.0–0.2)

## 2019-04-14 LAB — GLUCOSE, CAPILLARY
Glucose-Capillary: 181 mg/dL — ABNORMAL HIGH (ref 70–99)
Glucose-Capillary: 158 mg/dL — ABNORMAL HIGH (ref 70–99)
Glucose-Capillary: 86 mg/dL (ref 70–99)
Glucose-Capillary: 93 mg/dL (ref 70–99)
Glucose-Capillary: 100 mg/dL — ABNORMAL HIGH (ref 70–99)

## 2019-04-14 LAB — URINE CULTURE: Culture: NO GROWTH

## 2019-04-14 LAB — ECHOCARDIOGRAM COMPLETE
Height: 70 in
Weight: 3132.3 oz

## 2019-04-14 LAB — HEMOGLOBIN A1C
Hgb A1c MFr Bld: 8.3 % — ABNORMAL HIGH (ref 4.8–5.6)
Mean Plasma Glucose: 191.51 mg/dL

## 2019-04-14 LAB — HIV ANTIBODY (ROUTINE TESTING W REFLEX): HIV Screen 4th Generation wRfx: NONREACTIVE

## 2019-04-14 LAB — SARS CORONAVIRUS 2 (TAT 6-24 HRS): SARS Coronavirus 2: NEGATIVE

## 2019-04-14 LAB — CBG MONITORING, ED: Glucose-Capillary: 209 mg/dL — ABNORMAL HIGH (ref 70–99)

## 2019-04-14 IMAGING — MR MR THORACIC SPINE WO/W CM
5 of 11 series · 18 of 48 positions shown · IV contrast (gadavist)
Comparison: [DATE] thoracic spine MRI. [DATE] chest,
abdomen, and pelvis CT.

CLINICAL DATA: Thoracic osteomyelitis.

EXAM:
MRI THORACIC WITHOUT AND WITH CONTRAST
TECHNIQUE: Multiplanar and multiecho pulse sequences of the thoracic spine were
obtained without and with intravenous contrast.
CONTRAST:  8.5mL GADAVIST GADOBUTROL 1 MMOL/ML IV SOLN

[Series 4: T2 · sagittal · 3.0mm · 0.66mm/px · 2 of 17 slices shown (1 of 3)]
[im 1/17]
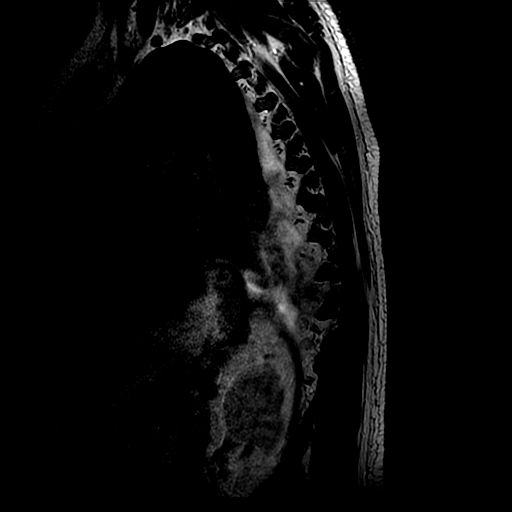
[im 17/17]
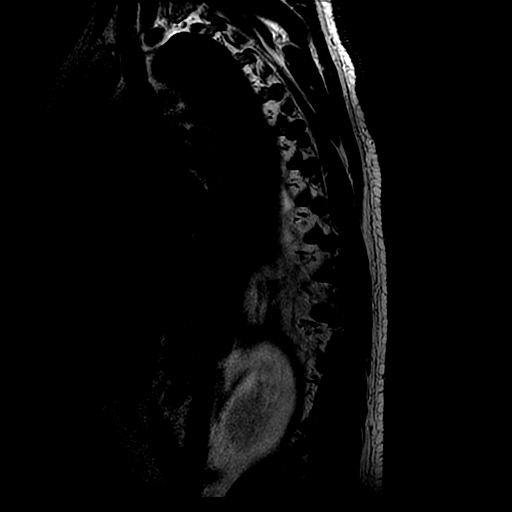

[Series 5: T1 · sagittal · 3.0mm · 0.66mm/px · 3 of 17 slices shown (1 of 2)]
[im 1/17]
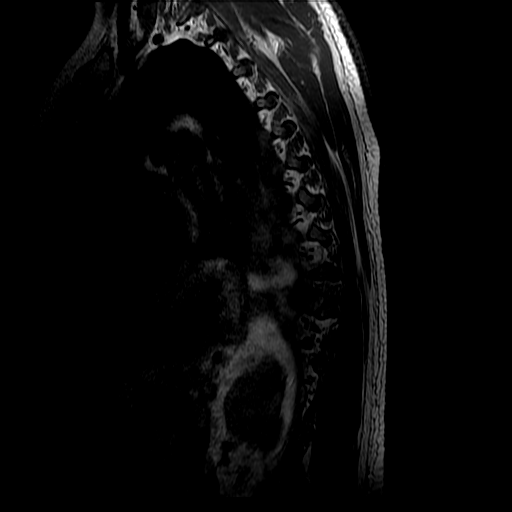
[im 9/17]
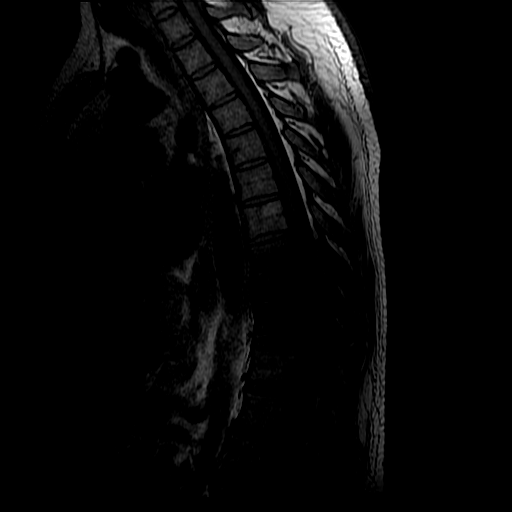
[im 17/17]
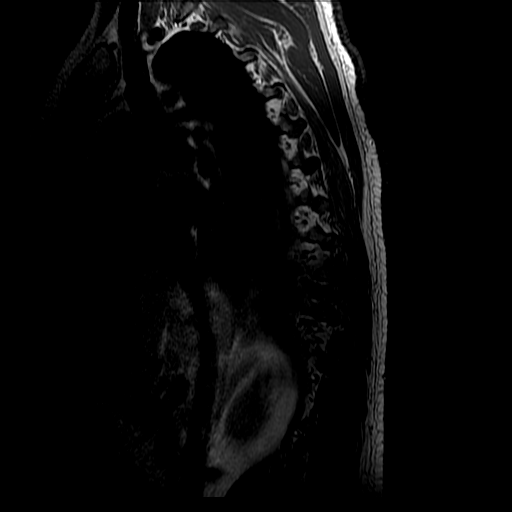

[Series 9: T2 · axial · 4.0mm · 0.43mm/px · z∈[-332,-86]mm · 6 of 39 slices shown (2 of 3)]
[im 1/39]
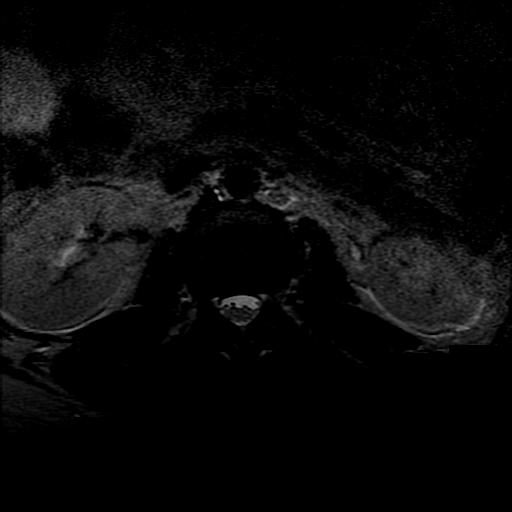
[im 8/39]
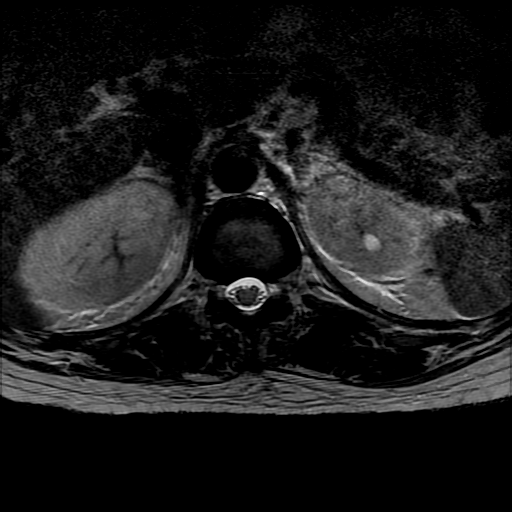
[im 16/39]
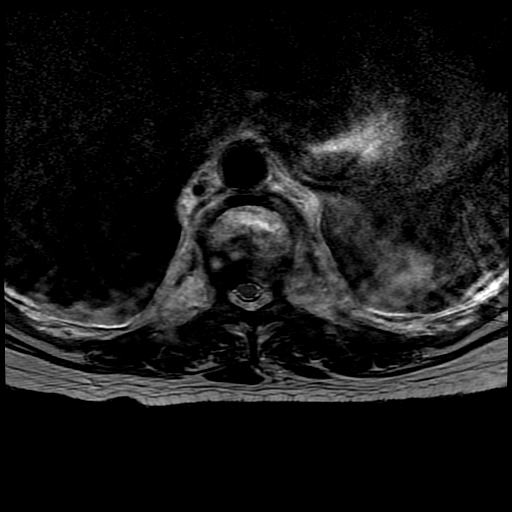
[im 23/39]
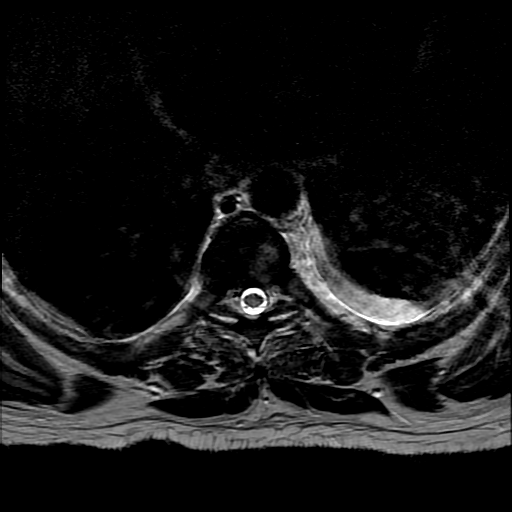
[im 31/39]
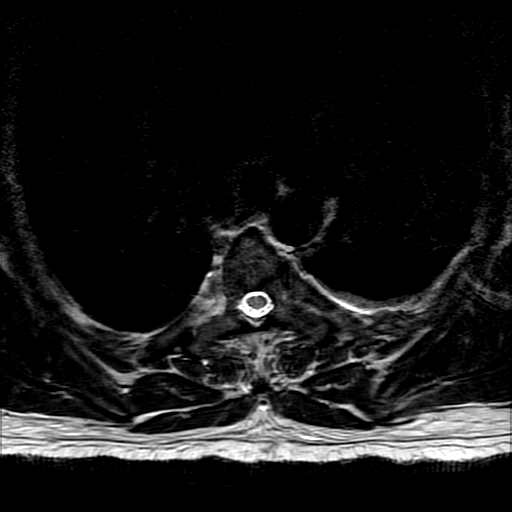
[im 39/39]
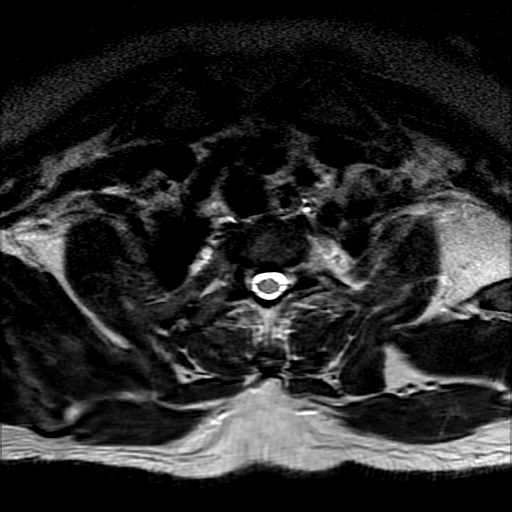

[Series 14: T2 · axial · 4.0mm · 0.43mm/px · z∈[-251,-181]mm · 2 of 15 slices shown (3 of 3)]
[im 1/15]
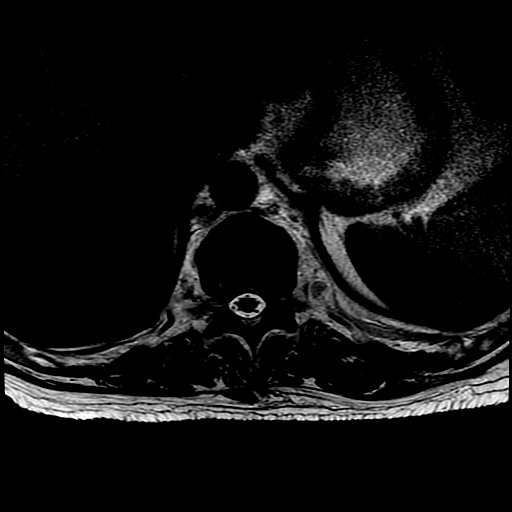
[im 15/15]
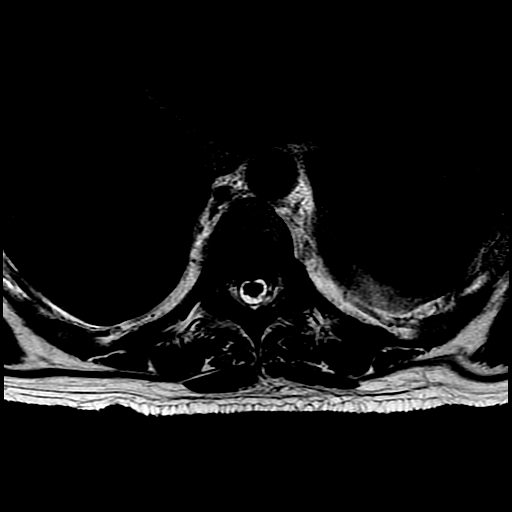

[Series 15: T1 · axial · non-contrast · 4.0mm · 0.39mm/px · z∈[-345,-175]mm · 5 of 63 slices shown (2 of 2)]
[im 1/63]
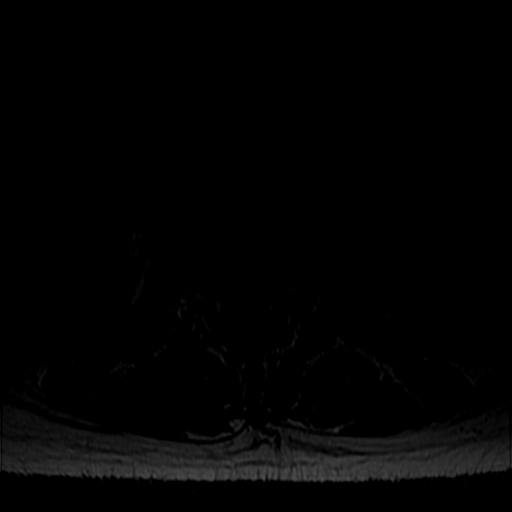
[im 7/63]
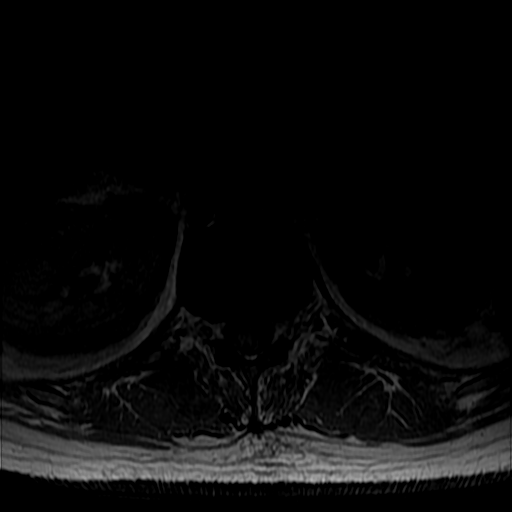
[im 21/63]
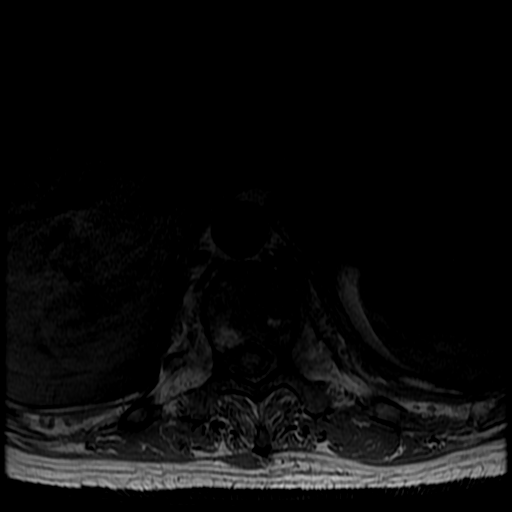
[im 28/63]
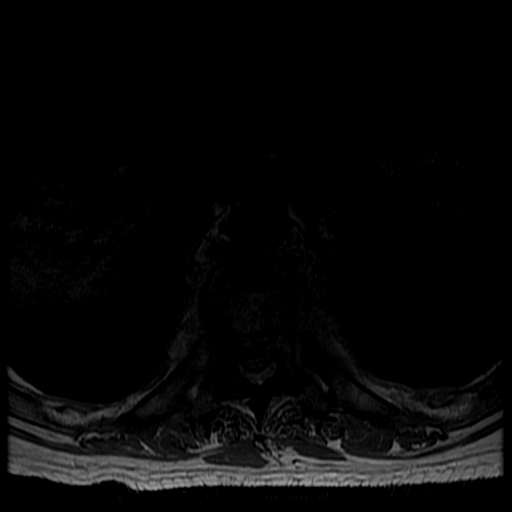
[im 35/63]
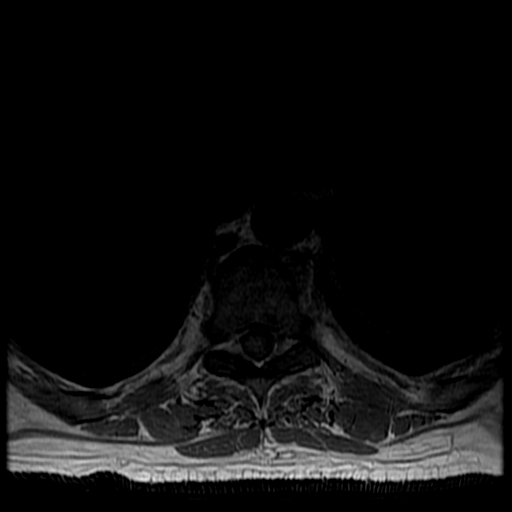

[18 of 48 positions shown; findings below may reference images not displayed]

FINDINGS: Alignment:  Mildly increased kyphosis centered at T9. No listhesis.

Vertebrae: Persistent abnormal marrow edema and enhancement in the
T9 vertebral body with progressive osseous erosion and pathologic
fracture likely extending into the pedicles and progressive moderate
to severe anterior vertebral body height loss compared to the prior
MRI. There is now edema and enhancement in the T8-9 and T9-10 disc
spaces with erosion of the T8 inferior and T10 superior endplate,
new from the prior MRI.

Persistent diffusely diminished bone marrow T1 signal intensity
throughout the thoracic spine. Hemangiomas in the T7 and T10
vertebral bodies.

Cord:  Normal cord signal.

Paraspinal and other soft tissues: There is persistent extensive
anterior paravertebral soft tissue inflammation/phlegmon
bilaterally. Posterior paraspinal muscle inflammation has decreased,
and no residual or new paraspinal fluid collection is identified.
Dorsal epidural enhancement extends from T6-7 to T11-12, improved
from the previous MRI where it extended superiorly to the T3 level.
Trace right and small left pleural effusions are much smaller than
on the prior MRI.

Disc levels:

Ventral and dorsal epidural inflammation at the T8-9 disc space and
T9 vertebral body levels result in increased, moderate to severe
spinal stenosis with moderate cord flattening. Moderate to severe
bilateral neural foraminal stenosis is again seen at T8-9 and T9-10.
A small central disc protrusion at T7-8 is unchanged and does not
result in stenosis.
IMPRESSION: 1. Progressive T9 osteomyelitis with increased osseous erosion and
vertebral body collapse/pathologic compression fracture since
[DATE].
[DATE]. New discitis at T8-9 and T9-10 with T8 and T10 osteomyelitis.
3. Overall decreased extent of dorsal epidural inflammation since
[DATE] but with increased, moderate to severe spinal stenosis at
T8-9.
4. Persistent extensive anterior paravertebral soft tissue
inflammation/phlegmon. Decreased posterior paraspinal inflammation
without residual or new abscess.

## 2019-04-14 MED ORDER — INSULIN GLARGINE 100 UNIT/ML ~~LOC~~ SOLN: 30.0000 [IU] | Freq: Every day | SUBCUTANEOUS | Status: DC

## 2019-04-14 MED ORDER — PNEUMOCOCCAL VAC POLYVALENT 25 MCG/0.5ML IJ INJ
0.5000 mL | INJECTION | INTRAMUSCULAR | Status: DC
  Filled 2019-04-14: qty 0.5

## 2019-04-14 MED ORDER — INSULIN ASPART 100 UNIT/ML ~~LOC~~ SOLN
0.0000 [IU] | SUBCUTANEOUS | Status: DC
  Administered 2019-04-14 (×2): 2 [IU] via SUBCUTANEOUS
  Administered 2019-04-15: 3 [IU] via SUBCUTANEOUS
  Administered 2019-04-15: 17:00:00 2 [IU] via SUBCUTANEOUS
  Administered 2019-04-15: 1 [IU] via SUBCUTANEOUS
  Administered 2019-04-16: 2 [IU] via SUBCUTANEOUS
  Administered 2019-04-16: 3 [IU] via SUBCUTANEOUS
  Administered 2019-04-16: 1 [IU] via SUBCUTANEOUS
  Administered 2019-04-16 (×2): 2 [IU] via SUBCUTANEOUS
  Administered 2019-04-17: 09:00:00 1 [IU] via SUBCUTANEOUS
  Administered 2019-04-17: 2 [IU] via SUBCUTANEOUS
  Administered 2019-04-17 (×2): 1 [IU] via SUBCUTANEOUS

## 2019-04-14 MED ORDER — INSULIN GLARGINE 100 UNIT/ML ~~LOC~~ SOLN
30.0000 [IU] | Freq: Every day | SUBCUTANEOUS | Status: DC
  Administered 2019-04-14: 15 [IU] via SUBCUTANEOUS
  Filled 2019-04-14: qty 0.3

## 2019-04-14 MED ORDER — ONDANSETRON HCL 4 MG/2ML IJ SOLN: 4.0000 mg | Freq: Four times a day (QID) | INTRAMUSCULAR | Status: DC | PRN

## 2019-04-14 MED ORDER — VANCOMYCIN HCL 1500 MG/300ML IV SOLN
1500.0000 mg | Freq: Two times a day (BID) | INTRAVENOUS | Status: DC
  Administered 2019-04-14 – 2019-04-18 (×8): 1500 mg via INTRAVENOUS
  Filled 2019-04-14 (×8): qty 300

## 2019-04-14 MED ORDER — INSULIN GLARGINE 100 UNIT/ML ~~LOC~~ SOLN: 15.0000 [IU] | Freq: Every day | SUBCUTANEOUS | Status: DC

## 2019-04-14 MED ORDER — ONDANSETRON HCL 4 MG PO TABS: 4.0000 mg | ORAL_TABLET | Freq: Four times a day (QID) | ORAL | Status: DC | PRN

## 2019-04-14 MED ORDER — GADOBUTROL 1 MMOL/ML IV SOLN
8.5000 mL | Freq: Once | INTRAVENOUS | Status: AC | PRN
  Administered 2019-04-14: 21:00:00 8.5 mL via INTRAVENOUS

## 2019-04-14 MED ORDER — HYDROMORPHONE HCL 1 MG/ML IJ SOLN
1.0000 mg | Freq: Once | INTRAMUSCULAR | Status: AC
  Administered 2019-04-14: 1 mg via INTRAVENOUS
  Filled 2019-04-14: qty 1

## 2019-04-14 MED ORDER — MUPIROCIN 2 % EX OINT
1.0000 "application " | TOPICAL_OINTMENT | Freq: Two times a day (BID) | CUTANEOUS | Status: AC
  Administered 2019-04-14 – 2019-04-19 (×10): 1 via NASAL
  Filled 2019-04-14 (×2): qty 22

## 2019-04-14 MED ORDER — HYDROMORPHONE HCL 1 MG/ML IJ SOLN
1.0000 mg | INTRAMUSCULAR | Status: DC | PRN
  Administered 2019-04-14 – 2019-04-20 (×35): 1 mg via INTRAVENOUS
  Filled 2019-04-14 (×37): qty 1

## 2019-04-14 MED ORDER — INSULIN ASPART 100 UNIT/ML ~~LOC~~ SOLN: 0.0000 [IU] | Freq: Every day | SUBCUTANEOUS | Status: DC

## 2019-04-14 MED ORDER — INSULIN GLARGINE 100 UNIT/ML ~~LOC~~ SOLN
15.0000 [IU] | Freq: Every day | SUBCUTANEOUS | Status: DC
  Administered 2019-04-15 – 2019-05-02 (×18): 15 [IU] via SUBCUTANEOUS
  Filled 2019-04-14 (×21): qty 0.15

## 2019-04-14 MED ORDER — INSULIN ASPART 100 UNIT/ML ~~LOC~~ SOLN: 0.0000 [IU] | Freq: Three times a day (TID) | SUBCUTANEOUS | Status: DC

## 2019-04-14 MED ORDER — HYDROMORPHONE HCL 1 MG/ML IJ SOLN
0.5000 mg | INTRAMUSCULAR | Status: DC | PRN
  Administered 2019-04-14: 1 mg via INTRAVENOUS
  Filled 2019-04-14: qty 1

## 2019-04-14 MED ORDER — CHLORHEXIDINE GLUCONATE CLOTH 2 % EX PADS
6.0000 | MEDICATED_PAD | Freq: Every day | CUTANEOUS | Status: AC
  Administered 2019-04-15 – 2019-04-19 (×4): 6 via TOPICAL

## 2019-04-14 NOTE — Consult Note (Addendum)
[facility] Nurse Consult Note: Reason for Consult: Consult requested for right toes.   Wound type: All 5 toes with tightly adhered eschar; no odor, drainage, or fluctuance; anterior great toe 3X3cm, other plantar toes 2X2cm Dressing procedure/placement/frequency: Topical treatment will be minimally effective to promote healing; Pt had an ABI in 8/20 which indicted: Right: Resting right ankle-brachial index indicates moderate right lower extremity arterial disease. The right toe-brachial index is abnormal. Both DP and PTA waveforms are monomorphic. The left toe-brachial index is abnormal. Secure chat message sent to primary team to request a Vascular consult for further recommendations.  Please re-consult if further assistance is needed.  Thank-you,  Julien Girt MSN, [place], [place], [place], [place]

## 2019-04-14 NOTE — H&P (Signed)
History and Physical    Thomas [name] IHK:[id number] DOB: 1975/11/22 DOA: 04/13/2019  PCP: [name], PA-C  Patient coming from: Home  I have personally briefly reviewed patient's old medical records in [facility]  Chief Complaint: Back pain  HPI: Thomas [name] is a 43 y.o. male with medical history significant of DM2 poorly controlled.  Patient just had a long hospital stay at [facility] from 10/18 to mid November for disseminated MRSA bacteremia, osteomyelitis and diskitis, MV endocarditis.  He was on teflaro and daptomycin during the admit, dapto was continued through Dec 20th.  For a full recounting of that admission please see the DC summary copied into Dr. Eliane Decree progress note from yesterday afternoon.  Patient continued on daptomycin until Dec 20th.  Had been feeling better though he had developed necrosis of his toes on his R foot since discharge.  After dapto was stopped on the 20th though, he has begun to feel worse in the past couple of days.  Having worsening back and rib pain, generalized weakness, fever, recent fall.  Difficulty urinating yesterday only.  Presents to [facility] ED.   ED Course: Tm 101.3, HR 131, BP 90/50 initially.  Given 3.5L fluid, cefepime and vanc empirically.  BP improved to 134/87.  CT chest, abd, pelvis is noteable for findings of advanced osteomyelitis of T9 as well as diskitis.  He is transferred to [facility] for further management.   Review of Systems: As per HPI, otherwise all review of systems negative.  Past Medical History:  Diagnosis Date  . Depression 01/27/2015  . Diabetes mellitus without complication (HCC)   . Hyperlipidemia   . Hypertension     Past Surgical History:  Procedure Laterality Date  . kidney stones  2009     reports that he has quit smoking. He has never used smokeless tobacco. He reports that he does not drink alcohol or use drugs.  No Known Allergies  No family history on file. No sick contacts.  Prior to  Admission medications   Medication Sig Start Date End Date Taking? Authorizing Provider  apixaban (ELIQUIS) 5 MG TABS tablet Take 1 tablet (5 mg total) by mouth daily. 04/13/19 08/11/19  Saguier, Ramon Dredge, PA-C  gabapentin (NEURONTIN) 100 MG capsule TAKE ONE CAPSULE BY MOUTH AT BEDTIME 01/27/19   Saguier, Ramon Dredge, PA-C  hydrochlorothiazide (HYDRODIURIL) 25 MG tablet TAKE ONE TABLET BY MOUTH DAILY 01/27/19   Saguier, Ramon Dredge, PA-C  Insulin Glargine (LANTUS SOLOSTAR) 100 UNIT/ML Solostar Pen 31 units into skin q hs 04/03/19   Saguier, Ramon Dredge, PA-C  lisinopril (ZESTRIL) 20 MG tablet TAKE ONE TABLET BY MOUTH DAILY 01/27/19   Saguier, Ramon Dredge, PA-C  lovastatin (MEVACOR) 40 MG tablet Take 1 tablet (40 mg total) by mouth at bedtime. 04/13/19   Saguier, Ramon Dredge, PA-C  metFORMIN (GLUCOPHAGE-XR) 500 MG 24 hr tablet TAKE TWO TABLETS BY MOUTH TWICE A DAY 01/27/19   Saguier, Ramon Dredge, PA-C  oxyCODONE-acetaminophen (PERCOCET/ROXICET) 5-325 MG tablet Take 1 tablet by mouth every 6 (six) hours as needed for severe pain. 03/25/19   Long, Arlyss Repress, MD  rivaroxaban (XARELTO) 20 MG TABS tablet Take 1 tablet (20 mg total) by mouth daily with supper. 04/13/19   [name], PA-C    Physical Exam: Vitals:   04/14/19 0130 04/14/19 0200 04/14/19 0205 04/14/19 0309  BP: 126/85 (!) 143/88 137/90 134/87  Pulse: (!) 106 (!) 107 (!) 106 (!) 102  Resp: 19 (!) Temp:    97.8 F (36.6 C)  TempSrc:  Oral  SpO2: 97% 98% 98% 98%  Weight:    88.8 kg  Height:    5\' 10"  (1.778 m)    Constitutional: NAD, calm, comfortable Eyes: PERRL, lids and conjunctivae normal ENMT: Mucous membranes are moist. Posterior pharynx clear of any exudate or lesions.Normal dentition.  Neck: normal, supple, no masses, no thyromegaly Respiratory: clear to auscultation bilaterally, no wheezing, no crackles. Normal respiratory effort. No accessory muscle use.  Cardiovascular: Regular rate and rhythm, no murmurs / rubs / gallops. No extremity  edema. 2+ pedal pulses. No carotid bruits.  Abdomen: no tenderness, no masses palpated. No hepatosplenomegaly. Bowel sounds positive.  Musculoskeletal: no clubbing / cyanosis. No joint deformity upper and lower extremities. Good ROM, no contractures. Normal muscle tone.  Skin: Necrosis of toes of R foot. Neurologic: CN 2-12 grossly intact. Sensation intact, DTR normal. Strength 5/5 in all 4.  Psychiatric: Normal judgment and insight. Alert and oriented x 3. Normal mood.    Labs on Admission: I have personally reviewed following labs and imaging studies  CBC: Recent Labs  Lab 04/13/19 1247  WBC 15.2*  HGB 9.4*  HCT 30.7*  MCV 79.9*  PLT 647*   Basic Metabolic Panel: Recent Labs  Lab 04/13/19 1247  NA 133*  K 4.1  CL 97*  CO2 24  GLUCOSE 267*  BUN 26*  CREATININE 1.37*  CALCIUM 9.5   GFR: Estimated Creatinine Clearance: 78.8 mL/min (A) (by C-G formula based on SCr of 1.37 mg/dL (H)). Liver Function Tests: Recent Labs  Lab 04/13/19 1247  AST 14*  ALT 25  ALKPHOS 84  BILITOT 0.4  PROT 8.8*  ALBUMIN 3.0*   Recent Labs  Lab 04/13/19 1247  LIPASE 20   No results for input(s): AMMONIA in the last 168 hours. Coagulation Profile: Recent Labs  Lab 04/13/19 1247  INR 1.3*   Cardiac Enzymes: No results for input(s): CKTOTAL, CKMB, CKMBINDEX, TROPONINI in the last 168 hours. BNP (last 3 results) No results for input(s): PROBNP in the last 8760 hours. HbA1C: No results for input(s): HGBA1C in the last 72 hours. CBG: Recent Labs  Lab 04/13/19 1330 04/14/19 0153  GLUCAP 252* 209*   Lipid Profile: No results for input(s): CHOL, HDL, LDLCALC, TRIG, CHOLHDL, LDLDIRECT in the last 72 hours. Thyroid Function Tests: No results for input(s): TSH, T4TOTAL, FREET4, T3FREE, THYROIDAB in the last 72 hours. Anemia Panel: No results for input(s): VITAMINB12, FOLATE, FERRITIN, TIBC, IRON, RETICCTPCT in the last 72 hours. Urine analysis:    Component Value Date/Time    COLORURINE YELLOW 04/13/2019 1945   APPEARANCEUR CLEAR 04/13/2019 1945   LABSPEC 1.015 04/13/2019 1945   PHURINE 5.5 04/13/2019 1945   GLUCOSEU NEGATIVE 04/13/2019 1945   HGBUR NEGATIVE 04/13/2019 1945   BILIRUBINUR NEGATIVE 04/13/2019 1945   KETONESUR NEGATIVE 04/13/2019 1945   PROTEINUR 30 (A) 04/13/2019 1945   NITRITE NEGATIVE 04/13/2019 1945   LEUKOCYTESUR NEGATIVE 04/13/2019 1945    Radiological Exams on Admission: CT Angio Chest PE W and/or Wo Contrast  Result Date: 04/13/2019 CLINICAL DATA:  Sepsis. EXAM: CT ANGIOGRAPHY CHEST CT ABDOMEN AND PELVIS WITH CONTRAST TECHNIQUE: Multidetector CT imaging of the chest was performed using the standard protocol during bolus administration of intravenous contrast. Multiplanar CT image reconstructions and MIPs were obtained to evaluate the vascular anatomy. Multidetector CT imaging of the abdomen and pelvis was performed using the standard protocol during bolus administration of intravenous contrast. CONTRAST:  100mL OMNIPAQUE IOHEXOL 350 MG/ML SOLN COMPARISON:  January 31, 2019. FINDINGS: CTA  CHEST FINDINGS Cardiovascular: Satisfactory opacification of the pulmonary arteries to the segmental level. No evidence of pulmonary embolism. Normal heart size. No pericardial effusion. Mediastinum/Nodes: No enlarged mediastinal, hilar, or axillary lymph nodes. Thyroid gland, trachea, and esophagus demonstrate no significant findings. Lungs/Pleura: No pneumothorax is noted. Minimal left pleural effusion is noted with adjacent left subsegmental atelectasis. Right lung is unremarkable. Musculoskeletal: There is noted nearly complete lytic destruction of the T9 vertebral body with adjacent lytic destruction of the inferior endplate of T8 and superior endplate of T 10. These findings are most consistent with a combination of diskitis and osteomyelitis at the T8-9 and T10 levels. MRI is recommended for further evaluation. Review of the MIP images confirms the above  findings. CT ABDOMEN and PELVIS FINDINGS Hepatobiliary: No focal liver abnormality is seen. No gallstones, gallbladder wall thickening, or biliary dilatation. Pancreas: Unremarkable. No pancreatic ductal dilatation or surrounding inflammatory changes. Spleen: Normal in size without focal abnormality. Adrenals/Urinary Tract: Adrenal glands are unremarkable. Kidneys are normal, without renal calculi, focal lesion, or hydronephrosis. Bladder is unremarkable. Stomach/Bowel: The stomach appears normal. There is no evidence of abnormal bowel dilatation. The appendix is unremarkable. However, there is seen focal circumferential wall thickening of the rectum concerning for inflammation or possibly malignancy. Sigmoidoscopy is recommended for further evaluation. Vascular/Lymphatic: Aortic atherosclerosis. No enlarged abdominal or pelvic lymph nodes. Reproductive: Prostate is unremarkable. Other: No abdominal wall hernia or abnormality. No abdominopelvic ascites. Musculoskeletal: No acute or significant osseous findings. Review of the MIP images confirms the above findings. IMPRESSION: Findings consistent with osteomyelitis and discitis at T8-9 and T9-10, with nearly complete lytic destruction of T9 vertebral body secondary to osteomyelitis. Further evaluation with MRI is recommended. Focal circumferential wall thickening of the rectum is noted concerning for inflammation or possibly malignancy. Sigmoidoscopy is recommended for further evaluation. Minimal left pleural effusion is noted with adjacent subsegmental atelectasis. No definite evidence of pulmonary embolus. Aortic Atherosclerosis (ICD10-I70.0). Electronically Signed   By: Lupita Raider M.D.   On: 04/13/2019 16:28   CT ABDOMEN PELVIS W CONTRAST  Result Date: 04/13/2019 CLINICAL DATA:  Sepsis. EXAM: CT ANGIOGRAPHY CHEST CT ABDOMEN AND PELVIS WITH CONTRAST TECHNIQUE: Multidetector CT imaging of the chest was performed using the standard protocol during bolus  administration of intravenous contrast. Multiplanar CT image reconstructions and MIPs were obtained to evaluate the vascular anatomy. Multidetector CT imaging of the abdomen and pelvis was performed using the standard protocol during bolus administration of intravenous contrast. CONTRAST:  OMNIPAQUE IOHEXOL 350 MG/ML SOLN COMPARISON:  January 31, 2019. FINDINGS: CTA CHEST FINDINGS Cardiovascular: Satisfactory opacification of the pulmonary arteries to the segmental level. No evidence of pulmonary embolism. Normal heart size. No pericardial effusion. Mediastinum/Nodes: No enlarged mediastinal, hilar, or axillary lymph nodes. Thyroid gland, trachea, and esophagus demonstrate no significant findings. Lungs/Pleura: No pneumothorax is noted. Minimal left pleural effusion is noted with adjacent left subsegmental atelectasis. Right lung is unremarkable. Musculoskeletal: There is noted nearly complete lytic destruction of the T9 vertebral body with adjacent lytic destruction of the inferior endplate of T8 and superior endplate of T 10. These findings are most consistent with a combination of diskitis and osteomyelitis at the T8-9 and T10 levels. MRI is recommended for further evaluation. Review of the MIP images confirms the above findings. CT ABDOMEN and PELVIS FINDINGS Hepatobiliary: No focal liver abnormality is seen. No gallstones, gallbladder wall thickening, or biliary dilatation. Pancreas: Unremarkable. No pancreatic ductal dilatation or surrounding inflammatory changes. Spleen: Normal in size without focal abnormality. Adrenals/Urinary  Tract: Adrenal glands are unremarkable. Kidneys are normal, without renal calculi, focal lesion, or hydronephrosis. Bladder is unremarkable. Stomach/Bowel: The stomach appears normal. There is no evidence of abnormal bowel dilatation. The appendix is unremarkable. However, there is seen focal circumferential wall thickening of the rectum concerning for inflammation or possibly  malignancy. Sigmoidoscopy is recommended for further evaluation. Vascular/Lymphatic: Aortic atherosclerosis. No enlarged abdominal or pelvic lymph nodes. Reproductive: Prostate is unremarkable. Other: No abdominal wall hernia or abnormality. No abdominopelvic ascites. Musculoskeletal: No acute or significant osseous findings. Review of the MIP images confirms the above findings. IMPRESSION: Findings consistent with osteomyelitis and discitis at T8-9 and T9-10, with nearly complete lytic destruction of T9 vertebral body secondary to osteomyelitis. Further evaluation with MRI is recommended. Focal circumferential wall thickening of the rectum is noted concerning for inflammation or possibly malignancy. Sigmoidoscopy is recommended for further evaluation. Minimal left pleural effusion is noted with adjacent subsegmental atelectasis. No definite evidence of pulmonary embolus. Aortic Atherosclerosis (ICD10-I70.0). Electronically Signed   By: Marijo Conception M.D.   On: 04/13/2019 16:28   DG Chest Portable 1 View  Result Date: 04/13/2019 CLINICAL DATA:  43 year old male with a history of chills and recent endocarditis EXAM: PORTABLE CHEST 1 VIEW COMPARISON:  02/24/2019, 02/22/2011 FINDINGS: Cardiomediastinal silhouette unchanged in size contour. Questionable reticulonodular opacity overlying the left cardiac border. No pneumothorax. No pleural effusion. IMPRESSION: Questionable reticulonodular opacity overlying the cardiac silhouette and the left heart border, potentially developing infection. Further evaluation with a formal PA and lateral chest x-ray may be useful, or alternatively CT. Electronically Signed   By: Corrie Mckusick D.O.   On: 04/13/2019 14:34    EKG: Independently reviewed.  Assessment/Plan Principal Problem:   Osteomyelitis of vertebra, multiple sites in spine [facility]) Active Problems:   Diabetes mellitus type II, uncontrolled (HCC)   HTN (hypertension)   Rectal mass   DVT of popliteal vein  (HCC)   Gangrene of toe of right foot ([facility])    1. Osteomyelitis of T9 Thoracic vertebra and diskitis of T8-9 and T9-10 - with sepsis and fever 1. Symptoms have occurred / reoccurred only since finishing dapto on 12/20, worrisome for dapto failure and recurrent MRSA 2. Concern that osteomyelitis may be worse than on imaging previously. 3. Obtaining MRI w and w/o of T spine to evaluate. 4. BCx pending 5. Will leave on the cefepime / vanc for the moment. 6. Will need neurosurgery consult this morning after MRI results available 7. Will need ID consult this morning 8. Will keep patient NPO and off of eliquis for the moment, in-case this ends up being surgical. 9. Alternatively if osteomyelitis not worse - source could still have MV endocarditis as seen on TEE at [facility] (or both endocarditis and osteomyelitis again) 2. DM2 -  1. Patient says he takes lantus 30 at home (supposed to be on 45 + SSI per DC summary from [facility]) 2. Will put patient on Lantus 15 QHS, first dose now 3. And sensitive SSI Q4H for the moment while NPO 3. HTN - holding home BP meds due to initial hypotension in ED 4. DVT of popliteal vein - Diagnosed 11/18 1. Holding eliquis that he is currently taking in case he ends up needing surgery for the spine 2. Resume if he doesn't 5. Gangrene of toes of R foot - 1. Looks to be dry gangrene at this point, no surrounding erythema or cellulitis 2. Getting wound care consult 6. ? Of Rectal mass - 1. On CT scan, ?  of inflammation or mass 2. CT suggests follow up sigmoidoscopy 3. Follow up GI  DVT prophylaxis: Holding eliquis, resume if he doesn't need surgery Code Status: Full Family Communication: No family in room Disposition Plan: Home after admit Consults called: None, call ID and NS in AM Admission status: Admit to inpatient  Severity of Illness: The appropriate patient status for this patient is INPATIENT. Inpatient status is judged to be reasonable and necessary in order  to provide the required intensity of service to ensure the patient's safety. The patient's presenting symptoms, physical exam findings, and initial radiographic and laboratory data in the context of their chronic comorbidities is felt to place them at high risk for further clinical deterioration. Furthermore, it is not anticipated that the patient will be medically stable for discharge from the hospital within 2 midnights of admission. The following factors support the patient status of inpatient.   IP status for sepsis, suspect recurrent MRSA sepsis / osteomyelitis / endocarditis, failed outpatient dapto for 6 weeks.   * I certify that at the point of admission it is my clinical judgment that the patient will require inpatient hospital care spanning beyond 2 midnights from the point of admission due to high intensity of service, high risk for further deterioration and high frequency of surveillance required.*    Altair Stanko M. DO [facility]  How to contact the [facility] Attending or Consulting provider 7A - 7P or covering provider during after hours 7P -7A, for this patient?  1. Check the care team in [facility] and look for a) attending/consulting [facility] provider listed and b) the [facility] team listed 2. Log into [email or web address]  Amion Physician Scheduling and messaging for groups and whole hospitals  On call and physician scheduling software for group practices, residents, hospitalists and other medical providers for call, clinic, rotation and shift schedules. OnCall [employer] is a hospital-wide system for scheduling doctors and paging doctors on call. EasyPlot is for scientific plotting and data analysis.  [email or web address]  and use [place]'s universal password to access. If you do not have the password, please contact the hospital operator.  3. Locate the [facility] provider you are looking for under [facility] and page to a number that you can be directly reached. 4. If you still have difficulty  reaching the provider, please page the [facility] (Director on Call) for the Hospitalists listed on amion for assistance.  04/14/2019, 4:03 AM

## 2019-04-14 NOTE — Progress Notes (Signed)
PHARMACY - PHYSICIAN COMMUNICATION CRITICAL VALUE ALERT - BLOOD CULTURE IDENTIFICATION (BCID)  Thomas [name] is an 43 y.o. male who presented to [facility] on 04/13/2019 with a chief complaint of worsen back pain, weakness, and fever.  Recently completed daptomycin on December 20 for MRSA bacteremia, osteomyelitis, diskitis.  Assessment:  1 of 4 blood culture bottles positive for GPC - no BCID done yet  Name of physician (or Provider) Contacted: Dr. Hollice Gong  Current antibiotics: Vancomycin/cefepime/metronidazole  Changes to prescribed antibiotics recommended: none - continue same antibiotics.  ID team to see today  No results found for this or any previous visit.  Candie Mile 04/14/2019  10:02 AM

## 2019-04-14 NOTE — Progress Notes (Signed)
No charge hospitalist follow up note. Patient is Arabic speaking.  Admitted with fever, hypotension now stable with normal vitals. History of MRSA related MV endocarditis, now found to have T9 osteo and discitis. Has been started on empiric IV antibiotics Vancomycin/Cefepime. Blood and urine cultures obtained in ER 12/29. - discussed with AM with Dr. Tommy Medal of ID who will see, and recommends consideration of IR biopsy - IR consult placed, they will review after MR thoracic spine completed this AM - also discussed with staff of Dr. Carlis Abbott vascular surgery in reference to patients lower extremity arterial disease noted by wound RN this morning - note Eliquis on hold in case of procedures

## 2019-04-14 NOTE — Progress Notes (Signed)
NIR requested by Dr. Hollice Gong for possible image-guided T8-T9 or T9-T10 disc aspiration.  Case has been reviewed by Dr. Estanislado Pandy who recommends MR thoracic spine to evaluate discitis prior to approving procedure. MR thoracic spine ordered by Dr. Alcario Drought this AM, awaiting scan. Will give further recommendations once MR thoracic spine has been reviewed by Dr. Estanislado Pandy.  Please call NIR with questions/concerns.   Bea Graff Delicia Berens, PA-C 04/14/2019, 9:00 AM

## 2019-04-14 NOTE — Progress Notes (Signed)
Pharmacy Antibiotic Note  Thomas [name] is a 43 y.o. male admitted on 04/13/2019 with sepsis.  Pharmacy has been consulted for vancomycin and cefepime dosing.  WBC 15.2>9.2, afebrile. LA 1.1. Scr has improved from 1.37 to 0.84 (CrCl >100 mL/min). ID is following for recurrent T spine diskitis, vertebral OM and bacteremia - also r/o recurrent infectious endocarditis of MV.   Plan: Increase vancomycin to 1500 mg IV every 12 hours (estAUC 436) Goal = AUC ~ 500 mcg Monitor renal fx, cx results, clinical pic, and for vanc levels as appropriate  Height: 5\' 10"  (177.8 cm) Weight: 195 lb 12.3 oz (88.8 kg) IBW/kg (Calculated) : 73  Temp (24hrs), Avg:99 F (37.2 C), Min:97.8 F (36.6 C), Max:101.3 F (38.5 C)  Recent Labs  Lab 04/13/19 1247 04/13/19 1531 04/14/19 0424  WBC 15.2*  --  9.2  CREATININE 1.37*  --  0.84  LATICACIDVEN 2.0* 1.1  --     Estimated Creatinine Clearance: 128.5 mL/min (by C-G formula based on SCr of 0.84 mg/dL).    No Known Allergies  Antimicrobials this admission: Vancomycin 12/29>> Cefepime 12/29>>12/30 Metronidazole 12/29 x1  Dose adjustments this admission: N/A  Microbiology results: 12/29 Bcx>>GPC in one bottle so far 12/30 Bcx>> sent   Thank you for allowing pharmacy to be a part of this patient's care.  Antonietta Jewel, PharmD, BCCCP Clinical Pharmacist  Phone: [phone]  Please check AMION for all [place] phone numbers After 10:00 PM, call [place] [phone] 04/14/2019 12:37 PM

## 2019-04-14 NOTE — Consult Note (Signed)
Date of Admission:  04/13/2019          Reason for Consult: recurrent T spine diskitis likely due to MRSAB  Referring Provider: Dr. Hollice Gong   Assessment:  1. 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 Rule out recurrent infectious endocarditis of MV 3. Necrotic toes due to PVD 4. Hx of Pyomyositis of right leg 5. DM  Plan:  1. Discontinue cefepime 2. Continue vancomycin 3. Repeat blood cultures 4. Transthoracic echocardiogram will likely need transesophageal echocardiogram 5. Neurosurgery consult pending 6. 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 insulin aspart  0-9 Units Subcutaneous Q4H  . insulin glargine  15 Units Subcutaneous QHS  . sodium chloride flush  3 mL Intravenous Once   Continuous Infusions: . sodium chloride 250 mL (04/13/19 2351)  . sodium chloride Stopped (04/13/19 2230)  . lactated ringers 100 mL/hr at 04/14/19 0321  . vancomycin 1,000 mg (04/14/19 0417)   PRN Meds:.sodium chloride, sodium chloride, acetaminophen, HYDROmorphone (DILAUDID) injection, ibuprofen, ondansetron **OR** ondansetron (ZOFRAN) IV  HPI: Thomas [name] is a 43 y.o. male male originally from [place] with a medical history of type 2 diabetestobacco use,and PAD (vascular studies done 11/2018 whopresented on 01/31/2019 with decreased p.o. intake, fevers,nausea, vomiting, back pain and chest pain. He was found to be bacteremic with MRSA growing in the blood. Source  intially was unclear on presentation however patient did have an area his toe that appearedcellulitic as well as an area on his right back that was erythematous and an abrasion  of the lateral R aspect of his shin (warm, TTP).Patient was started on cLinda mycin and vancomycin by the ICU, and later changed to . IV vancomycin for MRSA bacteremia. He subsequetly developed abscess formation in the R calf and pyomyositis of the L vastus intermedius.   I&D by ortho done on 10/23 of L leg and R leg abscesses. Wound cultures grew MRSA as well.   He had a TTE 10/20 showed no vegetations. EF 65%. However, given patient's persistent bacteremia, he was eventually transitioned from vancomycin to daptomycin and ceftaroline. He underwent TEE on 11/2 to further evaluate for possible valvular vegetation. TEE demonstrated findings suggestive of a mitral valve posterior leaflet small vegetation. 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 His dose was increased on 11/7 due to persistent bacteremia. Cultures cleared on 11/8. Teflaro was discontinued prior to discharge.  On 11/18, patient had noted new onset of swelling in right lower extremity and imaging was consistent with abscess formation. Patient was taken to operating room for I&D on 11/19. Intraoperative cultures remain negative.  Given patient's persistent back pain, and persistent bacteremia he also underwent t MR Brain and MR T/L Spine. 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 Spine surgery was consulted.     Saved IV daptomycin through the infusion center at [facility].  He completed antibiotics on December 20.  Roughly a week after stopping it he developed severe back pain and rib pain that continued to progress in the ensuing time.  [facility]ed [facility] with dizziness and difficulty urinating.  He had a CT scan done which showed worsening destruction of his T9 spine.  Blood cultures were taken and he was transferred to [facility] for admission to the hospitalist service.  MRI is planned  as is neurosurgical consultation.  Since then blood cultures are now growing gram-positive cocci in clusters.  The patient he is largely complaining of back pain.  He seems to think that his feet are not much of an issue and says that they have looked this way for some time.  Review of Systems: Review of Systems  Constitutional: Positive for chills and fever. Negative for diaphoresis, malaise/fatigue and weight loss.  HENT: Negative for congestion, hearing loss, sore throat and tinnitus.   Eyes: Negative for blurred vision and double vision.  Respiratory: Negative for cough, sputum production, shortness of breath and wheezing.   Cardiovascular: Positive for chest pain. Negative for palpitations and leg swelling.  Gastrointestinal: Negative for abdominal pain, blood in stool, constipation, diarrhea, heartburn, melena, nausea and vomiting.  Genitourinary: Negative for dysuria, flank pain and hematuria.  Musculoskeletal: Positive for back pain. Negative for falls, joint pain and myalgias.  Skin: Negative for itching and rash.  Neurological: Positive for dizziness. Negative for sensory change, focal weakness, loss of consciousness, weakness and headaches.  Endo/Heme/Allergies: Does not bruise/bleed easily.  Psychiatric/Behavioral: Negative for depression, memory loss and suicidal ideas. The patient is not nervous/anxious.     Past Medical History:  Diagnosis Date  . Depression 01/27/2015  . Diabetes mellitus without complication ([place])   . Hyperlipidemia   . Hypertension     Social History   Tobacco Use  . Smoking status: Former Research scientist (life sciences)  . Smokeless tobacco: Never Used  Substance Use Topics  . Alcohol use: No    Alcohol/week: 0.0 standard drinks  . Drug use: No    No family history on file. No Known Allergies  OBJECTIVE: Blood pressure 134/87, pulse (!) 102, temperature 97.8 F (36.6 C), temperature source Oral, resp. rate 18, height '5\' 10"'  (1.778 m), weight 88.8 kg, SpO2 98 %.   Physical Exam Constitutional:      General: He is not in acute distress.    Appearance: Normal appearance. He is well-developed. He is not ill-appearing or diaphoretic.  HENT:     Head: Normocephalic and atraumatic.     Right Ear: Hearing and external ear normal.     Left Ear: Hearing and external ear normal.     Nose: No nasal deformity or rhinorrhea.  Eyes:     General: No scleral icterus.    Extraocular Movements: Extraocular movements intact.     Conjunctiva/sclera: Conjunctivae normal.     Right eye: Right conjunctiva is not injected.     Left eye: Left conjunctiva is not injected.  Neck:     Vascular: No JVD.  Cardiovascular:     Rate and Rhythm: Normal rate and regular rhythm.     Heart sounds: Normal heart sounds, S1 normal and S2 normal. No murmur. No friction rub. No gallop.   Pulmonary:     Effort: Pulmonary effort is normal. No respiratory distress.     Breath sounds: Normal breath sounds. No stridor. No wheezing or rhonchi.  Abdominal:     General: Bowel sounds are normal. There is no distension.     Palpations: Abdomen is soft. There is no mass.     Tenderness: There is no abdominal tenderness.  Musculoskeletal:  General: Normal range of motion.     Right shoulder: Normal.     Left shoulder: Normal.     Cervical back: Normal range of motion and neck supple.     Right hip: Normal.     Left hip: Normal.     Right knee: Normal.     Left knee: Normal.  Lymphadenopathy:     Head:     Right side of head: No submandibular, preauricular or posterior auricular adenopathy.     Left side of head: No submandibular, preauricular or posterior auricular adenopathy.     Cervical: No cervical adenopathy.     Right cervical: No superficial or deep cervical adenopathy.    Left cervical: No superficial or deep cervical adenopathy.  Skin:    General: Skin is warm and dry.     Coloration: Skin is not pale.     Findings: No abrasion, bruising, ecchymosis, erythema,  lesion or rash.     Nails: There is no clubbing.  Neurological:     General: No focal deficit present.     Mental Status: He is alert and oriented to person, place, and time.     Sensory: No sensory deficit.     Coordination: Coordination normal.     Gait: Gait normal.  Psychiatric:        Attention and Perception: He is attentive.        Mood and Affect: Mood normal.        Speech: Speech normal.        Behavior: Behavior normal. Behavior is cooperative.        Thought Content: Thought content normal.        Judgment: Judgment normal.    Foot with ischemic changes see picture April 14, 2019:         Lab Results Lab Results  Component Value Date   WBC 9.2 04/14/2019   HGB 7.9 (L) 04/14/2019   HCT 25.1 (L) 04/14/2019   MCV 78.0 (L) 04/14/2019   PLT 528 (H) 04/14/2019    Lab Results  Component Value Date   CREATININE 0.84 04/14/2019   BUN 17 04/14/2019   NA 134 (L) 04/14/2019   K 4.0 04/14/2019   CL 101 04/14/2019   CO2 25 04/14/2019    Lab Results  Component Value Date   ALT 25 04/13/2019   AST 14 (L) 04/13/2019   ALKPHOS 84 04/13/2019   BILITOT 0.4 04/13/2019     Microbiology: Recent Results (from the past 240 hour(s))  Blood culture (routine x 2)     Status: None (Preliminary result)   Collection Time: 04/13/19 12:45 PM   Specimen: BLOOD  Result Value Ref Range Status   Specimen Description   Final    BLOOD RIGHT ANTECUBITAL Performed at [facility], [address] [place], [place] [address]    Special Requests   Final    BOTTLES DRAWN AEROBIC AND ANAEROBIC Blood Culture adequate volume Performed at [facility], [address] [place], [place] [address]    Culture   Final    NO GROWTH < 24 HOURS Performed at [facility], [place] [address] [place], [place] [address]    Report Status PENDING  Incomplete  SARS Coronavirus 2 Ag (30 min TAT) - Nasal Swab (BD Veritor Kit)     Status: None   Collection Time: 04/13/19  12:52 PM   Specimen: Nasal Swab (BD Veritor Kit)  Result Value Ref Range  Status   SARS Coronavirus 2 Ag NEGATIVE NEGATIVE Final    Comment: (NOTE) SARS-CoV-2 antigen NOT DETECTED.  Negative results are presumptive.  Negative results do not preclude SARS-CoV-2 infection and should not be used as the sole basis for treatment or other patient management decisions, including infection  control decisions, particularly in the presence of clinical signs and  symptoms consistent with COVID-19, or in those who have been in contact with the virus.  Negative results must be combined with clinical observations, patient history, and epidemiological information. The expected result is Negative. Fact Sheet for Patients: PodPark.tn Fact Sheet for Healthcare Providers: GiftContent.is This test is not yet approved or cleared by the [place] FDA and  has been authorized for detection and/or diagnosis of SARS-CoV-2 by FDA under an Emergency Use Authorization (EUA).  This EUA will remain in effect (meaning this test can be used) for the duration of  the COVID-19 de claration under Section 564(b)(1) of the Act, 21 U.S.C. section 360bbb-3(b)(1), unless the authorization is terminated or revoked sooner. Performed at [facility], [address] [place], [place] [address]   Blood culture (routine x 2)     Status: None (Preliminary result)   Collection Time: 04/13/19 12:56 PM   Specimen: BLOOD  Result Value Ref Range Status   Specimen Description   Final    BLOOD LEFT ANTECUBITAL Performed at [facility], [address] [place], [place] [address]    Special Requests   Final    BOTTLES DRAWN AEROBIC AND ANAEROBIC Blood Culture adequate volume Performed at [facility], [address] [place], [place] [address]    Culture  Setup Time   Final    GRAM POSITIVE COCCI AEROBIC BOTTLE ONLY CRITICAL RESULT  CALLED TO, READ BACK BY AND VERIFIED WITH: PHARND J Iran [id number] AT 950 AM BY CM    Culture   Final    CULTURE REINCUBATED FOR BETTER GROWTH Performed at [facility], [place] [address] [place], [place] [address]    Report Status PENDING  Incomplete  SARS CORONAVIRUS 2 (TAT 6-24 HRS) Nasopharyngeal Nasopharyngeal Swab     Status: None   Collection Time: 04/13/19  3:31 PM   Specimen: Nasopharyngeal Swab  Result Value Ref Range Status   SARS Coronavirus 2 NEGATIVE NEGATIVE Final    Comment: (NOTE) SARS-CoV-2 target nucleic acids are NOT DETECTED. The SARS-CoV-2 RNA is generally detectable in upper and lower respiratory specimens during the acute phase of infection. Negative results do not preclude SARS-CoV-2 infection, do not rule out co-infections with other pathogens, and should not be used as the sole basis for treatment or other patient management decisions. Negative results must be combined with clinical observations, patient history, and epidemiological information. The expected result is Negative. Fact Sheet for Patients: SugarRoll.be Fact Sheet for Healthcare Providers: https://www.woods-mathews.com/ This test is not yet approved or cleared by the [place] FDA and  has been authorized for detection and/or diagnosis of SARS-CoV-2 by FDA under an Emergency Use Authorization (EUA). This EUA will remain  in effect (meaning this test can be used) for the duration of the COVID-19 declaration under Section 56 4(b)(1) of the Act, 21 U.S.C. section 360bbb-3(b)(1), unless the authorization is terminated or revoked sooner. Performed at [facility], [place] [address], [place],  [address]   Surgical pcr screen     Status: Abnormal   Collection Time: 04/14/19  4:42 AM   Specimen:  Nasal Mucosa; Nasal Swab  Result Value Ref Range Status   MRSA, PCR POSITIVE (A) NEGATIVE Final    Comment: RESULT CALLED TO, READ BACK BY AND  VERIFIED WITH: C JOHNSON ON 123020 AT 1143 BY NFIELDS    Staphylococcus aureus POSITIVE (A) NEGATIVE Final    Comment: (NOTE) The Xpert SA Assay (FDA approved for NASAL specimens in patients 24 years of age and older), is one component of a comprehensive surveillance program. It is not intended to diagnose infection nor to guide or monitor treatment. Performed at [facility], [place] [address] [place], [place] [address]     Alcide Evener, [place] [facility] [phone] pager  04/14/2019, 11:49 AM

## 2019-04-14 NOTE — Consult Note (Addendum)
[facility] [place] CONSULT NOTE  We used the interpreter"IPAD" for speaking    MRN : [id number]  Reason for Consult: right foot wounds and abnormal ABI Referring Physician: IM Dr. Hollice Gong  History of Present Illness: Reported to ED with hypotension and fever 101.3.  We have been consulted for right foot necrosis of toes 1-5.  He states the toes are healing and that it started 2 months ago with edema and fluid secondary to infection in the right LE.    Chart review: Intervention during previous hospitalization 03/03/19.   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 A larger, more focal fluid in the distal vastus intermedius dissects into the vastus lateralis. Small fluid collection anterior to the quad tendon is worrisome for abscess. Negative for osteo." I&D by ortho done on 10/23 of L leg and R leg abscesses. Wound cultures grew MRSA as well.   04/13/19 CT chest, abd, pelvis is noteable for findings of advanced osteomyelitis of T9 as well as diskitis.  Patient just had a long hospital stay at [facility] from 10/18 to mid November for disseminated MRSA bacteremia, osteomyelitis and diskitis, MV endocarditis.  He was on teflaro and daptomycin during the admit, dapto was continued through Dec 20th.  For a full recounting of that admission please see the DC summary copied into Dr. Serita Grit progress note from yesterday afternoon.   Past medical history: History of popliteal DVT 11/18 managed on Eliquis.  Eliquis currently being held.   DM, HTN and Hyperlipidemia.     Current Facility-Administered Medications  Medication Dose Route Frequency Provider Last Rate Last Admin  . 0.9 %  sodium chloride infusion   Intravenous PRN Tegeler, Gwenyth Allegra, MD 10 mL/hr at 04/13/19 2351 250 mL at 04/13/19 2351  . 0.9 %   sodium chloride infusion   Intravenous PRN Tegeler, Gwenyth Allegra, MD   Stopped at 04/13/19 2230  . acetaminophen (TYLENOL) tablet 650 mg  650 mg Oral Q4H PRN Lucrezia Starch, MD      . ceFEPIme (MAXIPIME) 2 g in sodium chloride 0.9 % 100 mL IVPB  2 g Intravenous Q8H Tegeler, Gwenyth Allegra, MD 200 mL/hr at 04/14/19 0458 2 g at 04/14/19 0458  . HYDROmorphone (DILAUDID) injection 1 mg  1 mg Intravenous Q3H PRN Tomma Rakers, MD   1 mg at 04/14/19 6283  . ibuprofen (ADVIL) tablet 800 mg  800 mg Oral Q6H PRN Lucrezia Starch, MD      . insulin aspart (novoLOG) injection 0-9 Units  0-9 Units Subcutaneous Q4H Etta Quill, DO   2 Units at 04/14/19 [phone]  . insulin glargine (LANTUS) injection 15 Units  15 Units Subcutaneous QHS Jennette Kettle M, DO      . lactated ringers infusion   Intravenous Continuous Lucrezia Starch, MD 100 mL/hr at 04/14/19 0321 New Bag at 04/14/19 0321  . ondansetron (ZOFRAN) tablet 4 mg  4 mg Oral Q6H PRN Etta Quill, DO       Or  . ondansetron [facility]) injection 4 mg  4 mg Intravenous Q6H PRN Etta Quill, DO      . sodium chloride flush (NS) 0.9 % injection 3 mL  3 mL Intravenous Once Tegeler, Gwenyth Allegra, MD      . vancomycin (VANCOCIN) IVPB 1000 mg/200 mL premix  1,000  mg Intravenous Q12H Tegeler, Canary Brim, MD 200 mL/hr at 04/14/19 0417 1,000 mg at 04/14/19 0417    Pt meds include: Statin :Yes Betablocker: No ASA: No Other anticoagulants/antiplatelets: Eliquis (now on hold)  Past Medical History:  Diagnosis Date  . Depression 01/27/2015  . Diabetes mellitus without complication (HCC)   . Hyperlipidemia   . Hypertension     Past Surgical History:  Procedure Laterality Date  . kidney stones  2009    Social History Social History   Tobacco Use  . Smoking status: Former Games developer  . Smokeless tobacco: Never Used  Substance Use Topics  . Alcohol use: No    Alcohol/week: 0.0 standard drinks  . Drug use: No    Family  History No family history on file.  Unknwn  No Known Allergies   REVIEW OF SYSTEMS  General:  Weight loss,  Fever,  chills Neurologic: [x ] Dizziness,  Blackouts,  Seizure  Stroke,  "Mini stroke",  Slurred speech,  Temporary blindness;  weakness in arms or legs,  Hoarseness  Dysphagia Cardiac:  Chest pain/pressure,  Shortness of breath at rest  Shortness of breath with exertion,  Atrial fibrillation or irregular heartbeat  Vascular:  Pain in legs with walking,  Pain in legs at rest,  Pain in legs at night,  [x ] Non-healing ulcer, [x ] Blood clot in vein/DVT,   Pulmonary:  Home oxygen,  Productive cough,  Coughing up blood,  Asthma,   Wheezing  COPD Musculoskeletal:   Arthritis, [x ] Low back pain,  Joint pain Hematologic:  Easy Bruising,  Anemia;  Hepatitis Gastrointestinal:  Blood in stool,  Gastroesophageal Reflux/heartburn, Urinary:  chronic Kidney disease,  on HD -  MWF or  TTHS,  Burning with urination,  Difficulty urinating Skin:  Rashes,  Wounds Psychological:  Anxiety,  Depression  Physical Examination Vitals:   04/14/19 0130 04/14/19 0200 04/14/19 0205 04/14/19 0309  BP: 126/85 (!) 143/88 137/90 134/87  Pulse: (!) 106 (!) 107 (!) 106 (!) 102  Resp: 19 (!) Temp:    97.8 F (36.6 C)  TempSrc:    Oral  SpO2: 97% 98% 98% 98%  Weight:    88.8 kg  Height:     (1.778 m)   Body mass index is 28.09 kg/m.  General:  WDWN in NAD HENT: WNL Eyes: Pupils equal Pulmonary: normal non-labored breathing , without Rales, rhonchi,  wheezing Cardiac: RRR, without  Murmurs, rubs or gallops; No carotid bruits Abdomen: soft, NT, no masses Skin: no rashes, ulcers noted;  toes 1-5 tips  Dry Gangrene , no cellulitis; no open wounds;   Vascular Exam/Pulses:Palpable radial pulses B, Femoral pulses, right Monophasic DP , Biphasic  PT, left DP/PT  signals   Musculoskeletal: no muscle wasting or atrophy; no edema  Neurologic: A&O X 3; Appropriate Affect ;  SENSATION: normal; MOTOR FUNCTION: 5/5 Symmetric Speech is fluent/normal   Significant Diagnostic Studies: CBC Lab Results  Component Value Date   WBC 9.2 04/14/2019   HGB 7.9 (L) 04/14/2019   HCT 25.1 (L) 04/14/2019   MCV 78.0 (L) 04/14/2019   PLT 528 (H) 04/14/2019    BMET    Component Value Date/Time   NA 134 (L) 04/14/2019 0424  K 4.0 04/14/2019 0424   CL 101 04/14/2019 0424   CO2 25 04/14/2019 0424   GLUCOSE 200 (H) 04/14/2019 0424   BUN 17 04/14/2019 0424   CREATININE 0.84 04/14/2019 0424   CREATININE 0.98 11/14/2016 1352   CALCIUM 9.3 04/14/2019 0424   GFRNONAA >60 04/14/2019 0424   GFRNONAA >89 11/14/2016 1352   GFRAA >60 04/14/2019 0424   GFRAA >89 11/14/2016 1352   Estimated Creatinine Clearance: 128.5 mL/min (by C-G formula based on SCr of 0.84 mg/dL).  COAG Lab Results  Component Value Date   INR 1.3 (H) 04/13/2019     Non-Invasive Vascular Imaging:  Venous duplex 03/25/19 IMPRESSION: 1. POSITIVE for isolated right calf (posterior tibial and peroneal) DVT.  With history of popliteal DVT.    ABI Findings:11/27/2018 +---------+------------------+-----+----------+---------------------+ Right    Rt Pressure (mmHg)IndexWaveform  Comment               +---------+------------------+-----+----------+---------------------+ Brachial 145                    triphasic                       +---------+------------------+-----+----------+---------------------+ ATA                                       Could not be detected +---------+------------------+-----+----------+---------------------+ PTA      104               0.70 monophasic                      +---------+------------------+-----+----------+---------------------+ DP       97                0.65 monophasic                       +---------+------------------+-----+----------+---------------------+ Great Toe77                0.52 Abnormal                        +---------+------------------+-----+----------+---------------------+  +---------+------------------+-----+---------+---------------------+ Left     Lt Pressure (mmHg)IndexWaveform Comment               +---------+------------------+-----+---------+---------------------+ Brachial 149                    triphasic                      +---------+------------------+-----+---------+---------------------+ ATA                                      Could not be detected +---------+------------------+-----+---------+---------------------+ PTA      122               0.82 biphasic                       +---------+------------------+-----+---------+---------------------+ DP                                       Could not be detected +---------+------------------+-----+---------+---------------------+ Great Toe  Abnormal                       +---------+------------------+-----+---------+---------------------+  +-------+-----------+-----------+------------+------------+ ABI/TBIToday's ABIToday's TBIPrevious ABIPrevious TBI +-------+-----------+-----------+------------+------------+ Right  .70        .52                                 +-------+-----------+-----------+------------+------------+ Left   .82        .55                                 +-------+-----------+-----------+------------+------------+   No previous study to compare. No previous study to compare   Summary: Right: Resting right ankle-brachial index indicates moderate right lower extremity arterial disease. The right toe-brachial index is abnormal.  Both DP and PTA waveforms are monomorphic. Left: Resting left ankle-brachial index indicates mild left lower extremity arterial disease. The left toe-brachial  index is abnormal.   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 He has brisk PT signals B that sound biphasic.  I could not palpate pedal pulses.  His history includes right leg abscess and edema with I&D.  I'm not sure if the toes are dry gangrene or healing blood blisters from edema and DVT.    He may benefit for angiogram and B lower extremity runoff with possible intervention.  Plans will discussed with Dr. Chestine Spore.     Anticoagulation is on hold for IR to perform possible image-guided T8-T9 or T9-T10 disc aspiration.    Thomas [name] 04/14/2019 9:10 AM    I have seen and evaluated the patient. I agree with the PA note as documented above.   [name], MD [facility] [place] Office: [phone]   I have seen and evaluated the patient. I agree with the PA note as documented above.  43 year old male with prolonged hospital course as documented above.  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  Vascular surgery has been consulted for dry gangrene of the right first through fifth toes.  He has a palpable right femoral pulse but no palpable popliteal or pedal pulses.  ABIs on the right are 0.7 with monophasic waveforms at the ankle.  Would benefit from right lower extremity arteriogram with possible intervention for infrainguinal disease.  He states that toes have been demarcated for at least 1 month.  Has had prolonged hospital course at [facility] prior to his hospitalization here.  Will likely schedule for early next week pending all of his other scheduled procedures including MRI and possible image guided disc aspiration .  His toes are dry and do not appear to be any source of infection.  [name], MD [facility] [place] Office: [phone]

## 2019-04-14 NOTE — Progress Notes (Signed)
*  PRELIMINARY RESULTS* Echocardiogram 2D Echocardiogram has been performed.  Thomas [name] 04/14/2019, 11:42 AM

## 2019-04-15 ENCOUNTER — Inpatient Hospital Stay (HOSPITAL_COMMUNITY): Payer: Medicaid Other

## 2019-04-15 HISTORY — PX: IR FLUORO GUIDED NEEDLE PLC ASPIRATION/INJECTION LOC: IMG2395

## 2019-04-15 HISTORY — PX: IR CERVICAL/THORACIC DISC ASPIRATION W/IMAG GUIDE: IMG5397

## 2019-04-15 LAB — BLOOD CULTURE ID PANEL (REFLEXED)

## 2019-04-15 LAB — GLUCOSE, CAPILLARY
Glucose-Capillary: 108 mg/dL — ABNORMAL HIGH (ref 70–99)
Glucose-Capillary: 114 mg/dL — ABNORMAL HIGH (ref 70–99)
Glucose-Capillary: 144 mg/dL — ABNORMAL HIGH (ref 70–99)
Glucose-Capillary: 184 mg/dL — ABNORMAL HIGH (ref 70–99)
Glucose-Capillary: 232 mg/dL — ABNORMAL HIGH (ref 70–99)
Glucose-Capillary: 85 mg/dL (ref 70–99)

## 2019-04-15 LAB — SEDIMENTATION RATE: Sed Rate: >140 mm/hr — ABNORMAL HIGH (ref 0–16)

## 2019-04-15 LAB — HEPATITIS B SURFACE ANTIGEN: Hepatitis B Surface Ag: NONREACTIVE

## 2019-04-15 LAB — C-REACTIVE PROTEIN: CRP: 19.4 mg/dL — ABNORMAL HIGH (ref <1.0)

## 2019-04-15 IMAGING — XA IR DISC ASPIRATION W/IMAG GUIDE
1 series · 7 of 7 positions shown · non-contrast
Comparison: Lumbar spine MRI-[DATE];

INDICATION: Concern for discitis/osteomyelitis involving the T9 vertebral body
as well as the adjacent T8 and T9 intervertebral disc spaces. Please
perform image guided aspiration for tissue diagnostic purposes.

EXAM:
1. FLUOROSCOPIC GUIDED T9 VERTEBRAL BODY BIOPSY/ASPIRATION
2. ATTEMPTED THOUGH UNSUCCESSFUL FLUOROSCOPIC GUIDED ASPIRATION OF
THE T8-T9 INTERVERTEBRAL DISC SPACE SECONDARY TO LACK OF
PERCUTANEOUS WINDOW.

[Series 300: ir cervical/thoracic disc aspiration w/i · 7 of 7 slices shown]
[im 1/7]
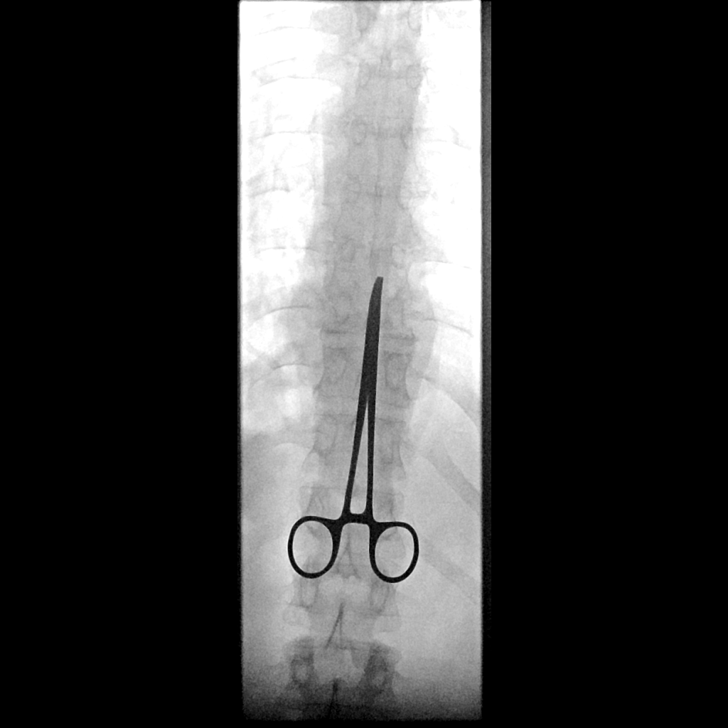
[im 2/7]
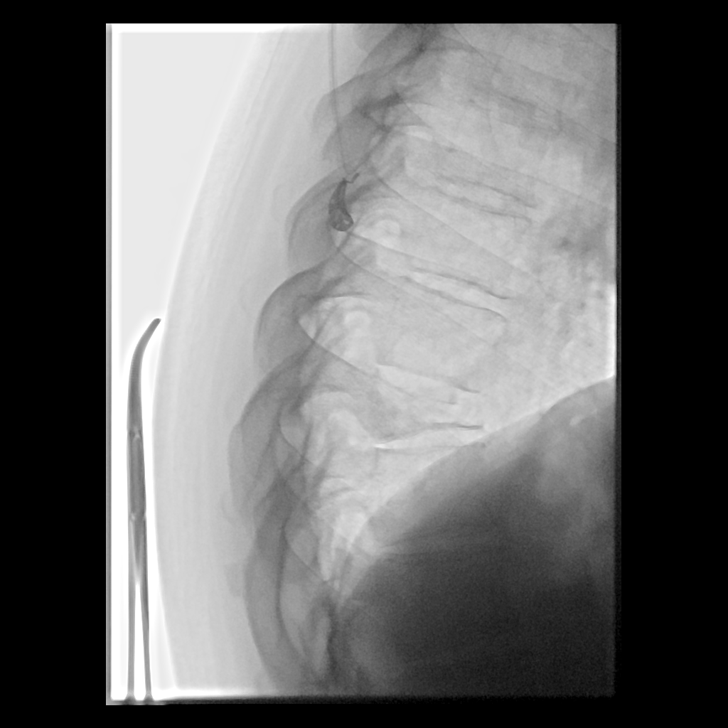
[im 3/7]
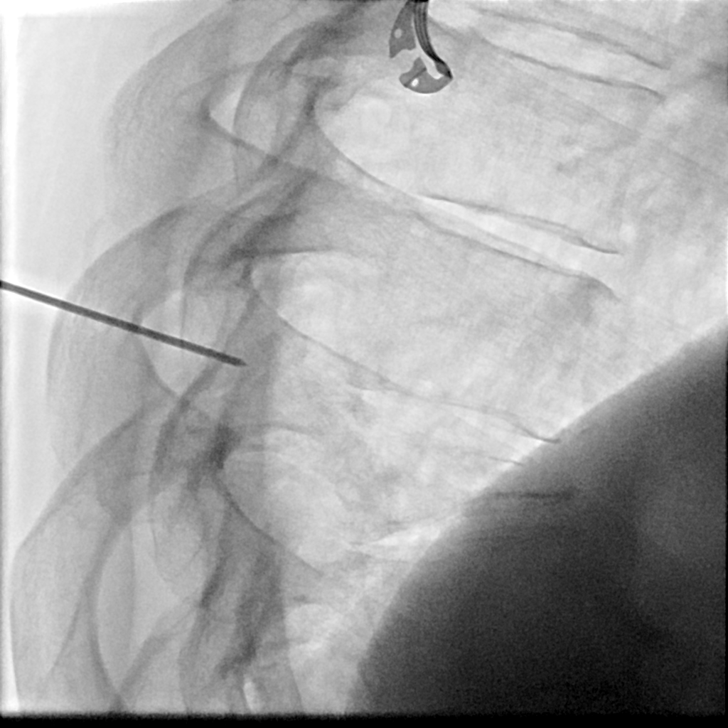
[im 4/7]
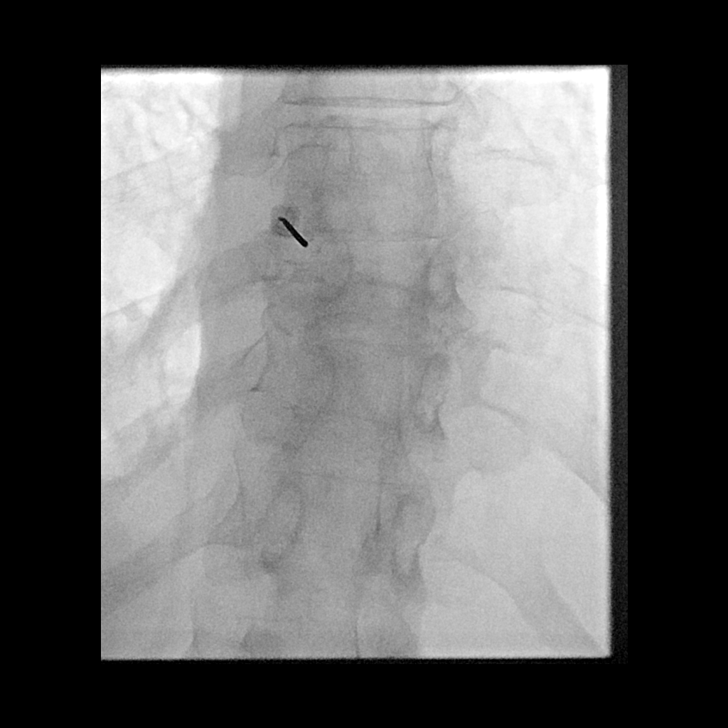
[im 5/7]
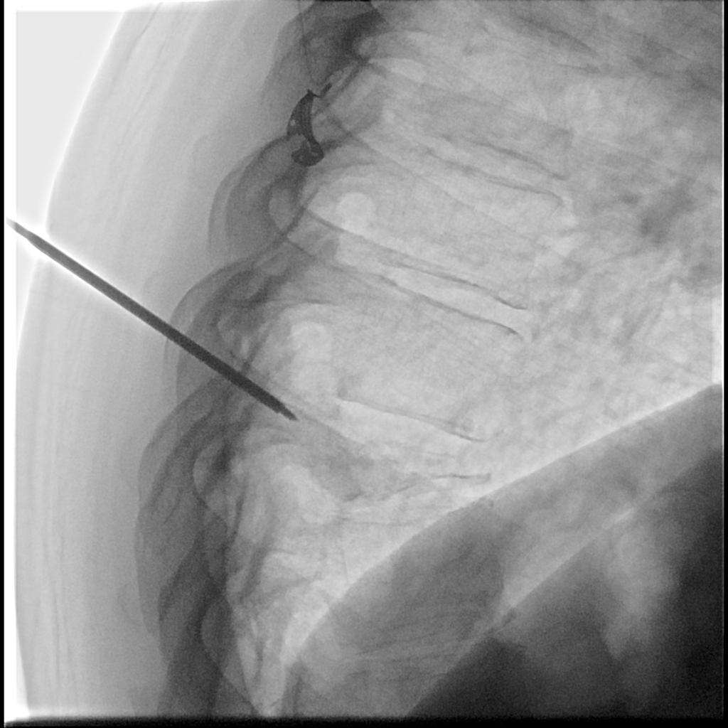
[im 6/7]
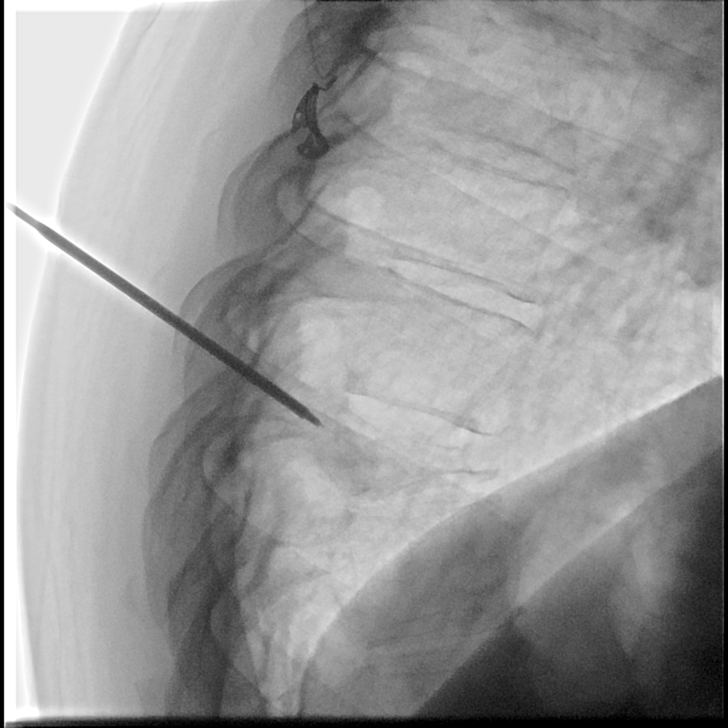
[im 7/7]
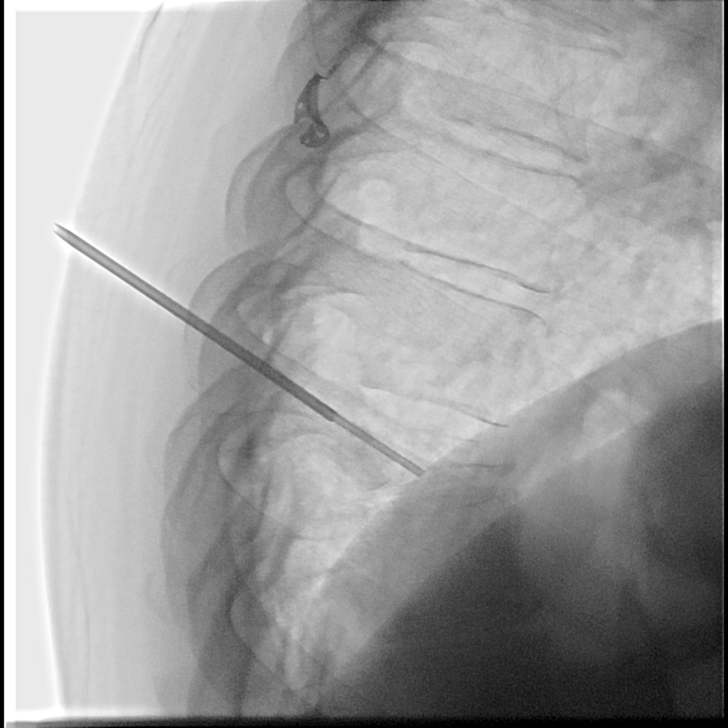

[7 of 7 positions shown; findings below may reference images not displayed]

chest CT-[DATE]

MEDICATIONS:
None

ANESTHESIA/SEDATION:
Moderate (conscious) sedation was employed during this procedure. A
total of Dilaudid 1 mg IV and Fentanyl 100 mcg was administered
intravenously.

Moderate Sedation Time: 34 minutes. The patient's level of
consciousness and vital signs were monitored continuously by
radiology nursing throughout the procedure under my direct
supervision.

CONTRAST:  None

FLUOROSCOPY TIME:  7 minutes, 36 seconds (539 mGy)

COMPLICATIONS:
None immediate.

PROCEDURE:
Informed written consent was obtained from the patient (via the use
of a medical translator) after a discussion of the risks, benefits
and alternatives to treatment. A timeout was performed prior to the
initiation of the procedure. The patient was positioned prone on the
fluoroscopy table. Initially, the T8-T9 intervertebral disc space
was marked fluoroscopically.

Utilizing an oblique, left-sided approach, an 18 gauge trocar needle
was utilized to attempt aspiration of the T8-T9 intervertebral disc
space however this ultimately proved unsuccessful secondary to lack
of an adequate percutaneous window.

As such, decision was made to perform a T9 vertebral body
biopsy/aspiration.

As such, the left pedicle at T9 was targeted with a 13 gauge bone
biopsy device. Next, an inner 15 gauge needle was advanced to the
mid aspect of the T9 vertebral body. Appropriate positioning was
confirmed in both anterior and lateral projection images.

At this point, bone biopsy was obtained. Next, through the 13 gauge
bone biopsy device, small amount of saline was instilled and
subsequently aspirated. All aspirated fluid was capped and sent to
the laboratory for analysis.

The needle was removed and superficial hemostasis was achieved with
manual compression. A dressing was placed. The patient tolerated the
procedure well without immediate postprocedural complication.
IMPRESSION: 1. Technically successful fluoroscopic guided biopsy of T9 vertebral
body.
2. Attempted though unsuccessful fluoroscopic guided aspiration the
T9-T10 intervertebral disc space secondary to lack of adequate
percutaneous window.

## 2019-04-15 MED ORDER — FENTANYL CITRATE (PF) 100 MCG/2ML IJ SOLN
INTRAMUSCULAR | Status: AC
  Filled 2019-04-15: qty 2

## 2019-04-15 MED ORDER — HYDROMORPHONE HCL 1 MG/ML IJ SOLN
INTRAMUSCULAR | Status: AC | PRN
  Administered 2019-04-15: 1 mg via INTRAVENOUS

## 2019-04-15 MED ORDER — HYDROMORPHONE HCL 1 MG/ML IJ SOLN
INTRAMUSCULAR | Status: AC
  Filled 2019-04-15: qty 1

## 2019-04-15 MED ORDER — LIDOCAINE HCL (PF) 1 % IJ SOLN
INTRAMUSCULAR | Status: AC
  Filled 2019-04-15: qty 30

## 2019-04-15 MED ORDER — LIDOCAINE HCL (PF) 1 % IJ SOLN
INTRAMUSCULAR | Status: AC | PRN
  Administered 2019-04-15: 20 mL

## 2019-04-15 MED ORDER — FENTANYL CITRATE (PF) 100 MCG/2ML IJ SOLN
INTRAMUSCULAR | Status: AC | PRN
  Administered 2019-04-15: 50 ug via INTRAVENOUS
  Administered 2019-04-15 (×2): 25 ug via INTRAVENOUS

## 2019-04-15 MED ORDER — SENNOSIDES-DOCUSATE SODIUM 8.6-50 MG PO TABS
2.0000 | ORAL_TABLET | Freq: Two times a day (BID) | ORAL | Status: DC
  Administered 2019-04-15 – 2019-05-03 (×30): 2 via ORAL
  Filled 2019-04-15 (×33): qty 2

## 2019-04-15 NOTE — Sedation Documentation (Addendum)
Patient ate at aprox 0900 unable to give versed for procedure MD aware. Unable to be sedation case. Patient heart rhythm in ventricular trigeminy. New rhythm from what is documented previously in chart. MD aware.

## 2019-04-15 NOTE — Progress Notes (Signed)
PROGRESS NOTE  Thomas [name] ZXY:[id number] DOB: 01/16/1976 DOA: 04/13/2019 PCP: Mackie Pai, PA-C  HPI/Recap of past 24 hours:  Thomas [name] is a 43 y.o. male with medical history significant of DM2 poorly controlled.  Had a long hospital stay at [facility] from 10/18 to mid November for disseminated MRSA bacteremia, osteomyelitis and diskitis, MV endocarditis.  He was on teflaro and daptomycin during the admit, dapto was continued through Dec 20th.  Developed necrosis of his R 1-5 digits since discharge.  After dapto was stopped he begun to feel worse for the past couple of days.  Worsening back and rib pain, generalized weakness, fever, recent fall, difficulty urinating.  Presented to [facility] ED then transferred to [facility].  ED Course: Tm 101.3, HR 131, BP 90/50 initially.  CT chest, abd, pelvis is noteable for findings of advanced osteomyelitis of T9 as well as diskitis.  04/15/19: Seen and examined.  Reports severe mid back pain.  Denies flank pain.  He has no other complaints.   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  Continue to follow cultures. MRI independently reviewed showed progressive T9 osteomyelitis with increased cultures evaluation and vertebral body collapse.  New discitis at T8-9 and T9-T10 with T8 and T10 osteomyelitis.  Moderate to severe spinal stenosis at T8-T9. Persistent extensive anterior paravertebral soft tissue inflammation/phlegmon. Decreased posterior paraspinal  inflammation without residual or new abscess. Cardiology has been consulted for TEE; tentative date Monday 04/19/19. Pain management in place Add Bowel regimen  History of mitral valve endocarditis Cardiology consulted for possible TEE If possible may consider CTS consult.  Recurrent thoracic vertebral osteomyelitis Previously treated with 6 weeks of IV antibiotics IV daptomycin on 04/04/2019. 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 Carlis Abbott. Continue to follow blood cultures to ensure clearance of bacteremia prior to procedure in case a stent is needed  History of DVT On Eliquis which is on hold due to possible procedure since 04/14/2019.  Possible rectal mass Monitor May require GI evaluation when more stable and bacteremia has cleared  Type 2 diabetes with hyperglycemia Obtain hemoglobin A1c Continue insulin coverage  Essential hypertension Continue to hold off antihypertensives for now due to soft blood pressure  Hyperlipidemia Continue Robaxin  Diabetic polyneuropathy Continue gabapentin  DVT prophylaxis: Holding eliquis, resume if he doesn't need surgery Code Status: Full Family Communication: No family in room Disposition Plan: Home after admit Consults called:  Infectious disease, vascular surgery, cardiology for TEE.  Objective: Vitals:   04/15/19 0013 04/15/19 0426 04/15/19 0546 04/15/19 0746  BP: (!) 154/78 (!) 143/81  127/65  Pulse: 100 (!) 103 99 (!) 107  Resp: '19 14 16   ' Temp: 98.7 F (37.1 C) 98.8 F (37.1 C)  97.8 F (36.6 C)  TempSrc: Oral Oral  Oral  SpO2: 98% 95% 93% 100%  Weight:  88.6 kg    Height:  Intake/Output Summary (Last 24 hours) at 04/15/2019 1002 Last data filed at 04/15/2019 0600 Gross per 24 hour  Intake 1740 ml  Output 2750 ml  Net -1010 ml   Filed Weights    04/14/19 0309 04/15/19 0426  Weight: 88.8 kg 88.6 kg    Exam:  . General: 43 y.o. year-old male well developed well nourished in no acute distress.  Alert and oriented x3. . Cardiovascular: Regular rate and rhythm with no rubs or gallops.  No thyromegaly or JVD noted.  Marland Kitchen Respiratory: Clear to auscultation with no wheezes or rales. Good inspiratory effort. . Abdomen: Soft nontender nondistended with normal bowel sounds x4 quadrants. . Musculoskeletal: Trace lower extremity edema.  Dry gangrene affecting first through fifth right toes. Marland Kitchen Psychiatry: Mood is appropriate for condition and setting   Data Reviewed: CBC: Recent Labs  Lab 04/13/19 1247 04/14/19 0424  WBC 15.2* 9.2  HGB 9.4* 7.9*  HCT 30.7* 25.1*  MCV 79.9* 78.0*  PLT 647* 474*   Basic Metabolic Panel: Recent Labs  Lab 04/13/19 1247 04/14/19 0424  NA 133* 134*  K 4.1 4.0  CL 97* 101  CO2 24 25  GLUCOSE 267* 200*  BUN 26* 17  CREATININE 1.37* 0.84  CALCIUM 9.5 9.3   GFR: Estimated Creatinine Clearance: 128.3 mL/min (by C-G formula based on SCr of 0.84 mg/dL). Liver Function Tests: Recent Labs  Lab 04/13/19 1247  AST 14*  ALT 25  ALKPHOS 84  BILITOT 0.4  PROT 8.8*  ALBUMIN 3.0*   Recent Labs  Lab 04/13/19 1247  LIPASE 20   No results for input(s): AMMONIA in the last 168 hours. Coagulation Profile: Recent Labs  Lab 04/13/19 1247  INR 1.3*   Cardiac Enzymes: No results for input(s): CKTOTAL, CKMB, CKMBINDEX, TROPONINI in the last 168 hours. BNP (last 3 results) No results for input(s): PROBNP in the last 8760 hours. HbA1C: Recent Labs    04/14/19 0424  HGBA1C 8.3*   CBG: Recent Labs  Lab 04/14/19 1650 04/14/19 2154 04/15/19 0012 04/15/19 0424 04/15/19 0744  GLUCAP 93 100* 114* 108* 85   Lipid Profile: No results for input(s): CHOL, HDL, LDLCALC, TRIG, CHOLHDL, LDLDIRECT in the last 72 hours. Thyroid Function Tests: No results for input(s): TSH, T4TOTAL, FREET4, T3FREE,  THYROIDAB in the last 72 hours. Anemia Panel: No results for input(s): VITAMINB12, FOLATE, FERRITIN, TIBC, IRON, RETICCTPCT in the last 72 hours. Urine analysis:    Component Value Date/Time   COLORURINE YELLOW 04/13/2019 1945   APPEARANCEUR CLEAR 04/13/2019 1945   LABSPEC 1.015 04/13/2019 1945   PHURINE 5.5 04/13/2019 1945   GLUCOSEU NEGATIVE 04/13/2019 1945   HGBUR NEGATIVE 04/13/2019 1945   [place] NEGATIVE 04/13/2019 [place] NEGATIVE 04/13/2019 1945   PROTEINUR 30 (A) 04/13/2019 1945   NITRITE NEGATIVE 04/13/2019 1945   LEUKOCYTESUR NEGATIVE 04/13/2019 1945   Sepsis Labs: '@LABRCNTIP' (procalcitonin:4,lacticidven:4)  ) Recent Results (from the past 240 hour(s))  Blood culture (routine x 2)     Status: None (Preliminary result)   Collection Time: 04/13/19 12:45 PM   Specimen: BLOOD  Result Value Ref Range Status   Specimen Description   Final    BLOOD RIGHT ANTECUBITAL Performed at [facility], [address] [place], [place] [address]    Special Requests   Final    BOTTLES DRAWN AEROBIC AND ANAEROBIC Blood Culture adequate volume Performed at [facility], [address] [place], [place] [address]    Culture  Setup  Time   Final    GRAM POSITIVE COCCI IN CLUSTERS AEROBIC BOTTLE ONLY Organism ID to follow CRITICAL RESULT CALLED TO, READ BACK BY AND VERIFIED WITH: M. Devol, AT [id number] 04/15/19 BY Rush Landmark Performed at [facility], [place] [address], [place], [place] [address]    Culture GRAM POSITIVE COCCI  Final   Report Status PENDING  Incomplete  Blood Culture ID Panel (Reflexed)     Status: Abnormal   Collection Time: 04/13/19 12:45 PM  Result Value Ref Range Status   Enterococcus species NOT DETECTED NOT DETECTED Final   Listeria monocytogenes NOT DETECTED NOT DETECTED Final   Staphylococcus species DETECTED (A) NOT DETECTED Final    Comment: CRITICAL RESULT CALLED TO, READ BACK BY AND VERIFIED WITH: M. Mango, AT [id number] 04/15/19 BY D. VANHOOK    Staphylococcus aureus (BCID) DETECTED (A) NOT DETECTED Final    Comment: Methicillin (oxacillin)-resistant Staphylococcus aureus (MRSA). MRSA is predictably resistant to beta-lactam antibiotics (except ceftaroline). Preferred therapy is vancomycin unless clinically contraindicated. Patient requires contact precautions if  hospitalized. CRITICAL RESULT CALLED TO, READ BACK BY AND VERIFIED WITH: [place], AT [phone] 04/15/19 BY D. VANHOOK    Methicillin resistance DETECTED (A) NOT DETECTED Final    Comment: CRITICAL RESULT CALLED TO, READ BACK BY AND VERIFIED WITH: Hughie Closs PHARMD, AT [phone] 04/15/19 BY D. VANHOOK    Streptococcus species NOT DETECTED NOT DETECTED Final   Streptococcus agalactiae NOT DETECTED NOT DETECTED Final   Streptococcus pneumoniae NOT DETECTED NOT DETECTED Final   Streptococcus pyogenes NOT DETECTED NOT DETECTED Final   Acinetobacter baumannii NOT DETECTED NOT DETECTED Final   Enterobacteriaceae species NOT DETECTED NOT DETECTED Final   Enterobacter cloacae complex NOT DETECTED NOT DETECTED Final   Escherichia coli NOT DETECTED NOT DETECTED Final   Klebsiella oxytoca NOT DETECTED NOT DETECTED Final   Klebsiella pneumoniae NOT DETECTED NOT DETECTED Final   Proteus species NOT DETECTED NOT DETECTED Final   Serratia marcescens NOT DETECTED NOT DETECTED Final   Haemophilus influenzae NOT DETECTED NOT DETECTED Final   Neisseria meningitidis NOT DETECTED NOT DETECTED Final   Pseudomonas aeruginosa NOT DETECTED NOT DETECTED Final   Candida albicans NOT DETECTED NOT DETECTED Final   Candida glabrata NOT DETECTED NOT DETECTED Final   Candida krusei NOT DETECTED NOT DETECTED Final   Candida parapsilosis NOT DETECTED NOT DETECTED Final   Candida tropicalis NOT DETECTED NOT DETECTED Final    Comment: Performed at [facility], [place], [place], [place] [address]  SARS Coronavirus 2 Ag (30 min TAT) - Nasal Swab (BD  Veritor Kit)     Status: None   Collection Time: 04/13/19 12:52 PM   Specimen: Nasal Swab (BD Veritor Kit)  Result Value Ref Range Status   SARS Coronavirus 2 Ag NEGATIVE NEGATIVE Final    Comment: (NOTE) SARS-CoV-2 antigen NOT DETECTED.  Negative results are presumptive.  Negative results do not preclude SARS-CoV-2 infection and should not be used as the sole basis for treatment or other patient management decisions, including infection  control decisions, particularly in the presence of clinical signs and  symptoms consistent with COVID-19, or in those who have been in contact with the virus.  Negative results must be combined with clinical observations, patient history, and epidemiological information. The expected result is Negative. Fact Sheet for Patients: PodPark.tn Fact Sheet for Healthcare Providers: GiftContent.is This test is not yet approved or cleared by the [place] FDA and  has been authorized for detection and/or diagnosis of SARS-CoV-2 by FDA under an Emergency Use Authorization (EUA).  This EUA will remain in effect (meaning this test can be used) for the duration of  the COVID-19 de claration under Section 564(b)(1) of the Act, 21 U.S.C. section 360bbb-3(b)(1), unless the authorization is terminated or revoked sooner. Performed at [facility], [address] [place], [place] [address]   Blood culture (routine x 2)     Status: Abnormal (Preliminary result)   Collection Time: 04/13/19 12:56 PM   Specimen: BLOOD  Result Value Ref Range Status   Specimen Description   Final    BLOOD LEFT ANTECUBITAL Performed at [facility], [address] [place], [place] [address]    Special Requests   Final    BOTTLES DRAWN AEROBIC AND ANAEROBIC Blood Culture adequate volume Performed at [facility], [address] [place], [place] [address]    Culture  Setup Time   Final     GRAM POSITIVE COCCI IN BOTH AEROBIC AND ANAEROBIC BOTTLES CRITICAL RESULT CALLED TO, READ BACK BY AND VERIFIED WITH: PHARND J Iran 123020 AT 950 AM BY CM    Culture (A)  Final    STAPHYLOCOCCUS AUREUS SUSCEPTIBILITIES TO FOLLOW Performed at [facility], [place] [address] [place], [place] [address]    Report Status PENDING  Incomplete  SARS CORONAVIRUS 2 (TAT 6-24 HRS) Nasopharyngeal Nasopharyngeal Swab     Status: None   Collection Time: 04/13/19  3:31 PM   Specimen: Nasopharyngeal Swab  Result Value Ref Range Status   SARS Coronavirus 2 NEGATIVE NEGATIVE Final    Comment: (NOTE) SARS-CoV-2 target nucleic acids are NOT DETECTED. The SARS-CoV-2 RNA is generally detectable in upper and lower respiratory specimens during the acute phase of infection. Negative results do not preclude SARS-CoV-2 infection, do not rule out co-infections with other pathogens, and should not be used as the sole basis for treatment or other patient management decisions. Negative results must be combined with clinical observations, patient history, and epidemiological information. The expected result is Negative. Fact Sheet for Patients: SugarRoll.be Fact Sheet for Healthcare Providers: https://www.woods-mathews.com/ This test is not yet approved or cleared by the [place] FDA and  has been authorized for detection and/or diagnosis of SARS-CoV-2 by FDA under an Emergency Use Authorization (EUA). This EUA will remain  in effect (meaning this test can be used) for the duration of the COVID-19 declaration under Section 56 4(b)(1) of the Act, 21 U.S.C. section 360bbb-3(b)(1), unless the authorization is terminated or revoked sooner. Performed at [facility], [place] [address], [place], [place] [address]   Urine culture     Status: None   Collection Time: 04/13/19  7:45 PM   Specimen: Urine, Random  Result Value Ref Range Status   Specimen  Description   Final    URINE, RANDOM Performed at [facility], [address] [place], [place] [address]    Special Requests   Final    NONE Performed at [facility], [address] [place], [place] [address]    Culture   Final    NO GROWTH Performed at [facility], [place] [address], [place],  [address]    Report Status 04/14/2019 FINAL  Final  Surgical pcr screen     Status: Abnormal   Collection Time: 04/14/19  4:42 AM   Specimen: Nasal Mucosa; Nasal Swab  Result Value Ref Range Status   MRSA, PCR POSITIVE (A) NEGATIVE Final    Comment: RESULT CALLED TO, READ BACK BY AND VERIFIED WITH: C JOHNSON ON 123020 AT 1143 BY NFIELDS    Staphylococcus aureus POSITIVE (A) NEGATIVE Final    Comment: (NOTE) The Xpert SA Assay (FDA approved for NASAL specimens in patients 68 years of age and older), is one component of a comprehensive surveillance program. It is not intended to diagnose infection nor to guide or monitor treatment. Performed at [facility], [place] [address] [place], [place] [address]   Culture, blood (routine x 2)     Status: None (Preliminary result)   Collection Time: 04/14/19 12:31 PM   Specimen: BLOOD RIGHT HAND  Result Value Ref Range Status   Specimen Description BLOOD RIGHT HAND  Final   Special Requests   Final    BOTTLES DRAWN AEROBIC AND ANAEROBIC Blood Culture adequate volume   Culture   Final    NO GROWTH <12 HOURS Performed at [facility], [place] [address], [place], [place] [address]    Report Status PENDING  Incomplete  Culture, blood (routine x 2)     Status: None (Preliminary result)   Collection Time: 04/14/19 12:46 PM   Specimen: BLOOD LEFT HAND  Result Value Ref Range Status   Specimen Description BLOOD LEFT HAND  Final   Special Requests   Final    BOTTLES DRAWN AEROBIC ONLY Blood Culture results may not be optimal due to an inadequate volume of blood received in culture bottles   Culture   Final     NO GROWTH <12 HOURS Performed at [facility], [place] [address] [place], [place] [address]    Report Status PENDING  Incomplete      Studies: MR THORACIC SPINE W WO CONTRAST  Result Date: 04/14/2019 CLINICAL DATA:  Thoracic osteomyelitis. EXAM: MRI THORACIC WITHOUT AND WITH CONTRAST TECHNIQUE: Multiplanar and multiecho pulse sequences of the thoracic spine were obtained without and with intravenous contrast. CONTRAST:  8.61m GADAVIST GADOBUTROL 1 MMOL/ML IV SOLN COMPARISON:  02/20/2019 thoracic spine MRI. 04/13/2019 chest, abdomen, and pelvis CT. FINDINGS: Alignment:  Mildly increased kyphosis centered at T9. No listhesis. Vertebrae: Persistent abnormal marrow edema and enhancement in the T9 vertebral body with progressive osseous erosion and pathologic fracture likely extending into the pedicles and progressive moderate to severe anterior vertebral body height loss compared to the prior MRI. There is now edema and enhancement in the T8-9 and T9-10 disc spaces with erosion of the T8 inferior and T10 superior endplate, new from the prior MRI. Persistent diffusely diminished bone marrow T1 signal intensity throughout the thoracic spine. Hemangiomas in the T7 and T10 vertebral bodies. Cord:  Normal cord signal. Paraspinal and other soft tissues: There is persistent extensive anterior paravertebral soft tissue inflammation/phlegmon bilaterally. Posterior paraspinal muscle inflammation has decreased, and no residual or new paraspinal fluid collection is identified. Dorsal epidural enhancement extends from T6-7 to T11-12, improved from the previous MRI where it extended superiorly to the T3 level. Trace right and small left pleural effusions are much smaller than on the prior MRI. Disc levels: Ventral and dorsal epidural inflammation at the T8-9 disc space and T9 vertebral body levels result in increased, moderate to severe spinal stenosis with moderate cord flattening. Moderate to severe bilateral  neural foraminal stenosis is again seen at T8-9 and T9-10. A small central disc protrusion at T7-8 is unchanged and does not result in stenosis. IMPRESSION:  1. Progressive T9 osteomyelitis with increased osseous erosion and vertebral body collapse/pathologic compression fracture since 02/20/2019. 2. New discitis at T8-9 and T9-10 with T8 and T10 osteomyelitis. 3. Overall decreased extent of dorsal epidural inflammation since 02/20/2019 but with increased, moderate to severe spinal stenosis at T8-9. 4. Persistent extensive anterior paravertebral soft tissue inflammation/phlegmon. Decreased posterior paraspinal inflammation without residual or new abscess. Electronically Signed   By: Logan Bores M.D.   On: 04/14/2019 22:10   ECHOCARDIOGRAM COMPLETE  Result Date: 04/14/2019   ECHOCARDIOGRAM REPORT   Patient Name:   Thomas [name] Date of Exam: 04/14/2019 Medical Rec #:  [id number]       Height: Accession #:    [id number]      Weight: Date of Birth:  October 30, 1975       BSA: Patient Age:    45 years        BP:           134/87 mmHg Patient Gender: M               HR:           102 bpm. Exam Location:  Inpatient Procedure: 2D Echo                             MODIFIED REPORT: This report was modified by Eleonore Chiquito MD on 04/14/2019 due to error.  Indications:     Bactermia  History:         Patient has no prior history of Echocardiogram examinations.                  Risk Factors:Diabetes, Hypertension, Dyslipidemia and Former                  Smoker.  Sonographer:     Leavy Cella Referring Phys:  [id number] Meres DAM Diagnosing Phys: Eleonore Chiquito MD IMPRESSIONS  1. No evidence of valvular vegetations on this study. If there is clinical suspicion for endocarditis, would recommend TEE to rule this out.  2. Left ventricular ejection fraction, by visual estimation, is 55 to 60%. The left ventricle has normal function. There is borderline left ventricular hypertrophy.  3. The left ventricle has no regional wall  motion abnormalities.  4. Global right ventricle has normal systolic function.The right ventricular size is normal. No increase in right ventricular wall thickness.  5. Left atrial size was normal.  6. Right atrial size was normal.  7. Presence of pericardial fat pad.  8. The pericardial effusion is lateral to the left ventricle.  9. Trivial pericardial effusion is present. 10. The mitral valve is grossly normal. No evidence of mitral valve regurgitation. 11. The tricuspid valve is grossly normal. 12. The aortic valve is tricuspid. Aortic valve regurgitation is not visualized. No evidence of aortic valve sclerosis or stenosis. 13. The pulmonic valve was grossly normal. Pulmonic valve regurgitation is not visualized. 14. The inferior vena cava is normal in size with greater than 50% respiratory variability, suggesting right atrial pressure of 3 mmHg. 15. No prior Echocardiogram. 16. TR signal is inadequate for assessing pulmonary artery systolic pressure. FINDINGS  Left Ventricle: Left ventricular ejection fraction, by visual estimation, is 55 to 60%. The left ventricle has normal function. The left ventricle has no regional wall motion abnormalities. The left ventricular internal cavity size was the left ventricle is normal in size. There is borderline left ventricular hypertrophy. Left ventricular diastolic parameters were  normal. Normal left atrial pressure. Right Ventricle: The right ventricular size is normal. No increase in right ventricular wall thickness. Global RV systolic function is has normal systolic function. Left Atrium: Left atrial size was normal in size. Right Atrium: Right atrial size was normal in size Pericardium: Trivial pericardial effusion is present. The pericardial effusion is lateral to the left ventricle. Presence of pericardial fat pad. Mitral Valve: The mitral valve is grossly normal. No evidence of mitral valve regurgitation. Tricuspid Valve: The tricuspid valve is grossly normal.  Tricuspid valve regurgitation is trivial. Aortic Valve: The aortic valve is tricuspid. Aortic valve regurgitation is not visualized. The aortic valve is structurally normal, with no evidence of sclerosis or stenosis. Pulmonic Valve: The pulmonic valve was grossly normal. Pulmonic valve regurgitation is not visualized. Pulmonic regurgitation is not visualized. Aorta: The aortic root is normal in size and structure. Venous: The inferior vena cava is normal in size with greater than 50% respiratory variability, suggesting right atrial pressure of 3 mmHg. IAS/Shunts: No atrial level shunt detected by color flow Doppler.  LEFT VENTRICLE PLAX 2D LVIDd:         3.90 cm LVIDs:         3.30 cm LV PW:         1.10 cm LV IVS:        1.20 cm LVOT diam:     2.00 cm LV SV:         22 ml LVOT Area:     3.14 cm  RIGHT VENTRICLE RV S prime:     1290.00 cm/s TAPSE (M-mode): 2.4 cm LEFT ATRIUM           RIGHT ATRIUM LA diam:      3.60 cm RA Area:     12.30 cm LA Vol (A2C): 28.1 ml RA Volume:   29.30 ml LA Vol (A4C): 30.8 ml  AORTIC VALVE LVOT Vmax:   92.86 cm/s LVOT Vmean:  60.762 cm/s LVOT VTI:    0.154 m MITRAL VALVE MV Area (PHT): 4.36 cm               SHUNTS MV PHT:        50.46 msec             Systemic VTI:  0.15 m MV Decel Time: 174 msec               Systemic Diam: 2.00 cm MV E velocity: 73.30 cm/s   103 cm/s MV A velocity: 6220.00 cm/s 70.3 cm/s MV E/A ratio:  0.01         1.5  Eleonore Chiquito MD Electronically signed by Eleonore Chiquito MD Signature Date/Time: 04/14/2019/1:51:06 PM    Final (Updated)     Scheduled Meds: . Chlorhexidine Gluconate Cloth  6 each Topical Q0600  . insulin aspart  0-9 Units Subcutaneous Q4H  . insulin glargine  15 Units Subcutaneous QHS  . mupirocin ointment  1 application Nasal BID  . pneumococcal 23 valent vaccine  0.5 mL Intramuscular Tomorrow-1000  . sodium chloride flush  3 mL Intravenous Once    Continuous Infusions: . sodium chloride 250 mL (04/13/19 2351)  . sodium chloride  Stopped (04/13/19 2230)  . lactated ringers 100 mL/hr at 04/15/19 0352  . vancomycin 1,500 mg (04/15/19 0351)     LOS: 2 days     Kayleen Memos, MD [facility] Pager [phone]  If 7PM-7AM, please contact night-coverage [email or web address] Password [email or web address] 04/15/2019, 10:02 AM

## 2019-04-15 NOTE — Progress Notes (Signed)
43 year old male that vascular surgery was consulted for dry gangrene of the right first through fifth toes.  I have tentatively posted him for an aortogram, angiogram, possible intervention with me on Monday.  Appears he has remained persistently bacteremic.  I do not think his toes are the source of infection and appear dry.  I would like for him to clear his bacteremia in case we have to place a stent in his lower extremity.  Appears he is getting MRI and spine and also repeat TTE/TEE to rule out recurrent infectious endocarditis.  May be some chance we delay this on Monday pending how he does through the weekend and assuming his toes are stable.  Marty Heck, MD [facility] Office: [phone] Pager: [phone]

## 2019-04-15 NOTE — Progress Notes (Signed)
PHARMACY - PHYSICIAN COMMUNICATION CRITICAL VALUE ALERT - BLOOD CULTURE IDENTIFICATION (BCID)  Thomas [name] is an 43 y.o. male who presented to [facility] on 04/13/2019 with a chief complaint of history of MRSA bacteremia/osteo/discitis  12/29 Blood>>Gram + cocci in clusters, BCID not performed  Name of physician (or Provider) Contacted: Tylene Fantasia ([facility])  Current antibiotics: Vancomycin   Changes to prescribed antibiotics recommended:  No changes needed  No results found for this or any previous visit.  Narda Bonds, PharmD, BCPS Clinical Pharmacist Phone: [phone]

## 2019-04-15 NOTE — Progress Notes (Signed)
PHARMACY - PHYSICIAN COMMUNICATION CRITICAL VALUE ALERT - BLOOD CULTURE IDENTIFICATION (BCID)  Thomas [name] is an 43 y.o. male who presented to [facility] on 04/13/2019   Assessment:  MRSA in blood cx  Name of physician (or Provider) Contacted: Dr Nevada Crane  Current antibiotics: Vanc  Changes to prescribed antibiotics recommended:  Continue vanc Auto ID Consult  Results for orders placed or performed during the hospital encounter of 04/13/19  Blood Culture ID Panel (Reflexed) (Collected: 04/13/2019 12:45 PM)  Result Value Ref Range   Enterococcus species NOT DETECTED NOT DETECTED   Listeria monocytogenes NOT DETECTED NOT DETECTED   Staphylococcus species DETECTED (A) NOT DETECTED   Staphylococcus aureus (BCID) DETECTED (A) NOT DETECTED   Methicillin resistance DETECTED (A) NOT DETECTED   Streptococcus species NOT DETECTED NOT DETECTED   Streptococcus agalactiae NOT DETECTED NOT DETECTED   Streptococcus pneumoniae NOT DETECTED NOT DETECTED   Streptococcus pyogenes NOT DETECTED NOT DETECTED   Acinetobacter baumannii NOT DETECTED NOT DETECTED   Enterobacteriaceae species NOT DETECTED NOT DETECTED   Enterobacter cloacae complex NOT DETECTED NOT DETECTED   Escherichia coli NOT DETECTED NOT DETECTED   Klebsiella oxytoca NOT DETECTED NOT DETECTED   Klebsiella pneumoniae NOT DETECTED NOT DETECTED   Proteus species NOT DETECTED NOT DETECTED   Serratia marcescens NOT DETECTED NOT DETECTED   Haemophilus influenzae NOT DETECTED NOT DETECTED   Neisseria meningitidis NOT DETECTED NOT DETECTED   Pseudomonas aeruginosa NOT DETECTED NOT DETECTED   Candida albicans NOT DETECTED NOT DETECTED   Candida glabrata NOT DETECTED NOT DETECTED   Candida krusei NOT DETECTED NOT DETECTED   Candida parapsilosis NOT DETECTED NOT DETECTED   Candida tropicalis NOT DETECTED NOT DETECTED   Barth Kirks, PharmD, BCPS, BCCCP Clinical Pharmacist [phone]  Please check AMION for all [place]  numbers  04/15/2019 8:06 AM

## 2019-04-15 NOTE — Consult Note (Signed)
Chief Complaint: Patient was seen in consultation today for T9 disc aspiration.   Referring Physician(s): Dr. Kirby Crigler  Supervising Physician: Gilmer Mor  Patient Status: [facility] - In-pt  History of Present Illness: Thomas [name] is a 43 y.o. male with a past medical history significant for depression, DVT on Eliquis, HTN, HLD, DM, recent endocarditis and osteomyelitis who presented to [facility] ED on 04/13/19 from his PCPs office after being seen for hypotension, lightheadedness, fatigue, tachycardia, tachypnea, nausea, vomiting, foul smelling urine and right flank/back pain. He had just recently completed a 6 week course of antibiotics for endocarditis and the pain in his back began a few days before he completed his antibiotics, he also had a fall around the same time and he landed on his back. He was made a code sepsis due to findings of elevated rectal temperature, tachycardia, tachypnea and hypotension with associated leukocytosis, elevated lactic acid, elevated creatinine and anemia. He was transferred to [facility] for admission and further evaluation. A CT abd/pelvis with contrast was performed which showed findings consistent with osteomyelitis and discitis at T8-9 and T9-10 with nearly complete lytic destruction of T9 vertberal body secondary to osteomyelitis. IR was consulted for possible T9 aspiration and an MRI thoracic spine was requested for further evaluation. MRI notable for progressive T9 osteomyelitis with increased osseous erosion and vertebral body collapse/pathologic compression fracture, discitis at T8-9 and T9-10 with T8 and T10 osteomyelitis. MRI has been reviewed and IR plans to proceed with CT guided aspiration today.  Patient seen with Dr. Grace Isaac in IR holding bay, all history obtained using Arabic interpreter [facility]). Patient reports continue back pain that is usually on the right and sometimes on the left - he points to the paraspinal area when describing the pain and  denies pain along his spine, but does endorse difficulty with sitting up/changing positions due to pain. He reports some numbness and tingling in his right foot as well as some previous edema which has resolved - of note he has a known gangrenous second toe and partial great toe. He denies any other complaints besides being nervous about the procedure. He states he had scrambled eggs, jell-o and orange juice this morning around 9 am. He states understanding of the requested procedure and wishes to proceed.   Past Medical History:  Diagnosis Date   Depression 01/27/2015   Diabetes mellitus without complication (HCC)    Hyperlipidemia    Hypertension     Past Surgical History:  Procedure Laterality Date   kidney stones  2009    Allergies: Patient has no known allergies.  Medications: Prior to Admission medications   Medication Sig Start Date End Date Taking? Authorizing Provider  apixaban (ELIQUIS) 5 MG TABS tablet Take 1 tablet (5 mg total) by mouth daily. 04/13/19 08/11/19  Saguier, Ramon Dredge, PA-C  gabapentin (NEURONTIN) 100 MG capsule TAKE ONE CAPSULE BY MOUTH AT BEDTIME 01/27/19   Saguier, Ramon Dredge, PA-C  hydrochlorothiazide (HYDRODIURIL) 25 MG tablet TAKE ONE TABLET BY MOUTH DAILY 01/27/19   Saguier, Ramon Dredge, PA-C  Insulin Glargine (LANTUS SOLOSTAR) 100 UNIT/ML Solostar Pen 31 units into skin q hs 04/03/19   Saguier, Ramon Dredge, PA-C  lisinopril (ZESTRIL) 20 MG tablet TAKE ONE TABLET BY MOUTH DAILY 01/27/19   Saguier, Ramon Dredge, PA-C  lovastatin (MEVACOR) 40 MG tablet Take 1 tablet (40 mg total) by mouth at bedtime. 04/13/19   Saguier, Ramon Dredge, PA-C  metFORMIN (GLUCOPHAGE-XR) 500 MG 24 hr tablet TAKE TWO TABLETS BY MOUTH TWICE A DAY 01/27/19   Saguier,  Ramon Dredge, PA-C  oxyCODONE-acetaminophen (PERCOCET/ROXICET) 5-325 MG tablet Take 1 tablet by mouth every 6 (six) hours as needed for severe pain. 03/25/19   Long, Arlyss Repress, MD  rivaroxaban (XARELTO) 20 MG TABS tablet Take 1 tablet (20 mg total) by  mouth daily with supper. 04/13/19   Saguier, Ramon Dredge, PA-C     No family history on file.  Social History   Socioeconomic History   Marital status: Single    Spouse name: Not on file   Number of children: Not on file   Years of education: Not on file   Highest education level: Not on file  Occupational History   Not on file  Tobacco Use   Smoking status: Former Smoker   Smokeless tobacco: Never Used  Substance and Sexual Activity   Alcohol use: No    Alcohol/week: 0.0 standard drinks   Drug use: No   Sexual activity: Not on file  Other Topics Concern   Not on file  Social History Narrative   Not on file   Social Determinants of Health   Financial Resource Strain:    Difficulty of Paying Living Expenses: Not on file  Food Insecurity:    Worried About Running Out of Food in the Last Year: Not on file   Ran Out of Food in the Last Year: Not on file  Transportation Needs:    Lack of Transportation (Medical): Not on file   Lack of Transportation (Non-Medical): Not on file  Physical Activity:    Days of Exercise per Week: Not on file   Minutes of Exercise per Session: Not on file  Stress:    Feeling of Stress : Not on file  Social Connections:    Frequency of Communication with Friends and Family: Not on file   Frequency of Social Gatherings with Friends and Family: Not on file   Attends Religious Services: Not on file   Active Member of Clubs or Organizations: Not on file   Attends Banker Meetings: Not on file   Marital Status: Not on file     Review of Systems: A 12 point ROS discussed and pertinent positives are indicated in the HPI above.  All other systems are negative.  Review of Systems  Constitutional: Negative for chills and fever.  Respiratory: Negative for cough and shortness of breath.   Cardiovascular: Negative for chest pain.  Gastrointestinal: Negative for abdominal pain, blood in stool, diarrhea, nausea and  vomiting.  Genitourinary: Positive for dysuria and flank pain. Negative for hematuria.  Musculoskeletal: Positive for back pain.  Skin: Positive for wound (right second toe, partial first toe gangrene).  Neurological: Positive for numbness (right foot). Negative for dizziness and headaches.  Psychiatric/Behavioral: The patient is nervous/anxious.     Vital Signs: BP (!) 182/83 (BP Location: Left Arm)    Pulse (!) 121    Temp 97.8 F (36.6 C) (Oral)    Resp (!) 27    Ht  (1.778 m)    Wt 195 lb 4.8 oz (88.6 kg)    SpO2 100%    BMI 28.02 kg/m   Physical Exam Vitals and nursing note reviewed.  Constitutional:      General: He is not in acute distress.    Comments: Very pleasant, talkative, good historian. Seen using arabic interpreter.  HENT:     Head: Normocephalic.     Mouth/Throat:     Mouth: Mucous membranes are moist.     Pharynx: Oropharynx is clear. No oropharyngeal  exudate or posterior oropharyngeal erythema.  Cardiovascular:     Rate and Rhythm: Regular rhythm. Tachycardia present.  Pulmonary:     Effort: Pulmonary effort is normal.     Breath sounds: Normal breath sounds.  Abdominal:     General: There is no distension.     Palpations: Abdomen is soft.     Tenderness: There is no abdominal tenderness.  Musculoskeletal:        General: Tenderness (cervical and thoracic spine to palpation. Thoracic and upper lumbar paraspinal areas) present.     Right lower leg: No edema.     Left lower leg: No edema.  Skin:    General: Skin is warm and dry.     Comments: (+) right great toe partial gangrene on the medial portion of the toe as well as gangrene of the top ~ 1/3 portion of right second toe   Neurological:     Mental Status: He is alert and oriented to person, place, and time.  Psychiatric:        Mood and Affect: Mood normal.        Behavior: Behavior normal.        Thought Content: Thought content normal.        Judgment: Judgment normal.      MD  Evaluation Airway: WNL Heart: WNL Abdomen: WNL Chest/ Lungs: WNL ASA  Classification: 2 Mallampati/Airway Score: Two   Imaging: CT Angio Chest PE W and/or Wo Contrast  Result Date: 04/13/2019 CLINICAL DATA:  Sepsis. EXAM: CT ANGIOGRAPHY CHEST CT ABDOMEN AND PELVIS WITH CONTRAST TECHNIQUE: Multidetector CT imaging of the chest was performed using the standard protocol during bolus administration of intravenous contrast. Multiplanar CT image reconstructions and MIPs were obtained to evaluate the vascular anatomy. Multidetector CT imaging of the abdomen and pelvis was performed using the standard protocol during bolus administration of intravenous contrast. CONTRAST:  OMNIPAQUE IOHEXOL 350 MG/ML SOLN COMPARISON:  January 31, 2019. FINDINGS: CTA CHEST FINDINGS Cardiovascular: Satisfactory opacification of the pulmonary arteries to the segmental level. No evidence of pulmonary embolism. Normal heart size. No pericardial effusion. Mediastinum/Nodes: No enlarged mediastinal, hilar, or axillary lymph nodes. Thyroid gland, trachea, and esophagus demonstrate no significant findings. Lungs/Pleura: No pneumothorax is noted. Minimal left pleural effusion is noted with adjacent left subsegmental atelectasis. Right lung is unremarkable. Musculoskeletal: There is noted nearly complete lytic destruction of the T9 vertebral body with adjacent lytic destruction of the inferior endplate of T8 and superior endplate of T 10. These findings are most consistent with a combination of diskitis and osteomyelitis at the T8-9 and T10 levels. MRI is recommended for further evaluation. Review of the MIP images confirms the above findings. CT ABDOMEN and PELVIS FINDINGS Hepatobiliary: No focal liver abnormality is seen. No gallstones, gallbladder wall thickening, or biliary dilatation. Pancreas: Unremarkable. No pancreatic ductal dilatation or surrounding inflammatory changes. Spleen: Normal in size without focal abnormality.  Adrenals/Urinary Tract: Adrenal glands are unremarkable. Kidneys are normal, without renal calculi, focal lesion, or hydronephrosis. Bladder is unremarkable. Stomach/Bowel: The stomach appears normal. There is no evidence of abnormal bowel dilatation. The appendix is unremarkable. However, there is seen focal circumferential wall thickening of the rectum concerning for inflammation or possibly malignancy. Sigmoidoscopy is recommended for further evaluation. Vascular/Lymphatic: Aortic atherosclerosis. No enlarged abdominal or pelvic lymph nodes. Reproductive: Prostate is unremarkable. Other: No abdominal wall hernia or abnormality. No abdominopelvic ascites. Musculoskeletal: No acute or significant osseous findings. Review of the MIP images confirms the above findings. IMPRESSION: Findings  consistent with osteomyelitis and discitis at T8-9 and T9-10, with nearly complete lytic destruction of T9 vertebral body secondary to osteomyelitis. Further evaluation with MRI is recommended. Focal circumferential wall thickening of the rectum is noted concerning for inflammation or possibly malignancy. Sigmoidoscopy is recommended for further evaluation. Minimal left pleural effusion is noted with adjacent subsegmental atelectasis. No definite evidence of pulmonary embolus. Aortic Atherosclerosis (ICD10-I70.0). Electronically Signed   By: Lupita Raider M.D.   On: 04/13/2019 16:28   MR THORACIC SPINE W WO CONTRAST  Result Date: 04/14/2019 CLINICAL DATA:  Thoracic osteomyelitis. EXAM: MRI THORACIC WITHOUT AND WITH CONTRAST TECHNIQUE: Multiplanar and multiecho pulse sequences of the thoracic spine were obtained without and with intravenous contrast. CONTRAST:  8.43mL GADAVIST GADOBUTROL 1 MMOL/ML IV SOLN COMPARISON:  02/20/2019 thoracic spine MRI. 04/13/2019 chest, abdomen, and pelvis CT. FINDINGS: Alignment:  Mildly increased kyphosis centered at T9. No listhesis. Vertebrae: Persistent abnormal marrow edema and enhancement  in the T9 vertebral body with progressive osseous erosion and pathologic fracture likely extending into the pedicles and progressive moderate to severe anterior vertebral body height loss compared to the prior MRI. There is now edema and enhancement in the T8-9 and T9-10 disc spaces with erosion of the T8 inferior and T10 superior endplate, new from the prior MRI. Persistent diffusely diminished bone marrow T1 signal intensity throughout the thoracic spine. Hemangiomas in the T7 and T10 vertebral bodies. Cord:  Normal cord signal. Paraspinal and other soft tissues: There is persistent extensive anterior paravertebral soft tissue inflammation/phlegmon bilaterally. Posterior paraspinal muscle inflammation has decreased, and no residual or new paraspinal fluid collection is identified. Dorsal epidural enhancement extends from T6-7 to T11-12, improved from the previous MRI where it extended superiorly to the T3 level. Trace right and small left pleural effusions are much smaller than on the prior MRI. Disc levels: Ventral and dorsal epidural inflammation at the T8-9 disc space and T9 vertebral body levels result in increased, moderate to severe spinal stenosis with moderate cord flattening. Moderate to severe bilateral neural foraminal stenosis is again seen at T8-9 and T9-10. A small central disc protrusion at T7-8 is unchanged and does not result in stenosis. IMPRESSION: 1. Progressive T9 osteomyelitis with increased osseous erosion and vertebral body collapse/pathologic compression fracture since 02/20/2019. 2. New discitis at T8-9 and T9-10 with T8 and T10 osteomyelitis. 3. Overall decreased extent of dorsal epidural inflammation since 02/20/2019 but with increased, moderate to severe spinal stenosis at T8-9. 4. Persistent extensive anterior paravertebral soft tissue inflammation/phlegmon. Decreased posterior paraspinal inflammation without residual or new abscess. Electronically Signed   By: Sebastian Ache M.D.   On:  04/14/2019 22:10   CT ABDOMEN PELVIS W CONTRAST  Result Date: 04/13/2019 CLINICAL DATA:  Sepsis. EXAM: CT ANGIOGRAPHY CHEST CT ABDOMEN AND PELVIS WITH CONTRAST TECHNIQUE: Multidetector CT imaging of the chest was performed using the standard protocol during bolus administration of intravenous contrast. Multiplanar CT image reconstructions and MIPs were obtained to evaluate the vascular anatomy. Multidetector CT imaging of the abdomen and pelvis was performed using the standard protocol during bolus administration of intravenous contrast. CONTRAST:  OMNIPAQUE IOHEXOL 350 MG/ML SOLN COMPARISON:  January 31, 2019. FINDINGS: CTA CHEST FINDINGS Cardiovascular: Satisfactory opacification of the pulmonary arteries to the segmental level. No evidence of pulmonary embolism. Normal heart size. No pericardial effusion. Mediastinum/Nodes: No enlarged mediastinal, hilar, or axillary lymph nodes. Thyroid gland, trachea, and esophagus demonstrate no significant findings. Lungs/Pleura: No pneumothorax is noted. Minimal left pleural effusion is noted with  adjacent left subsegmental atelectasis. Right lung is unremarkable. Musculoskeletal: There is noted nearly complete lytic destruction of the T9 vertebral body with adjacent lytic destruction of the inferior endplate of T8 and superior endplate of T 10. These findings are most consistent with a combination of diskitis and osteomyelitis at the T8-9 and T10 levels. MRI is recommended for further evaluation. Review of the MIP images confirms the above findings. CT ABDOMEN and PELVIS FINDINGS Hepatobiliary: No focal liver abnormality is seen. No gallstones, gallbladder wall thickening, or biliary dilatation. Pancreas: Unremarkable. No pancreatic ductal dilatation or surrounding inflammatory changes. Spleen: Normal in size without focal abnormality. Adrenals/Urinary Tract: Adrenal glands are unremarkable. Kidneys are normal, without renal calculi, focal lesion, or  hydronephrosis. Bladder is unremarkable. Stomach/Bowel: The stomach appears normal. There is no evidence of abnormal bowel dilatation. The appendix is unremarkable. However, there is seen focal circumferential wall thickening of the rectum concerning for inflammation or possibly malignancy. Sigmoidoscopy is recommended for further evaluation. Vascular/Lymphatic: Aortic atherosclerosis. No enlarged abdominal or pelvic lymph nodes. Reproductive: Prostate is unremarkable. Other: No abdominal wall hernia or abnormality. No abdominopelvic ascites. Musculoskeletal: No acute or significant osseous findings. Review of the MIP images confirms the above findings. IMPRESSION: Findings consistent with osteomyelitis and discitis at T8-9 and T9-10, with nearly complete lytic destruction of T9 vertebral body secondary to osteomyelitis. Further evaluation with MRI is recommended. Focal circumferential wall thickening of the rectum is noted concerning for inflammation or possibly malignancy. Sigmoidoscopy is recommended for further evaluation. Minimal left pleural effusion is noted with adjacent subsegmental atelectasis. No definite evidence of pulmonary embolus. Aortic Atherosclerosis (ICD10-I70.0). Electronically Signed   By: Lupita Raider M.D.   On: 04/13/2019 16:28   US Venous Img Lower Unilateral Right  Result Date: 03/25/2019 CLINICAL DATA:  Pain.  Calf DVT suggested on CT EXAM: RIGHT LOWER EXTREMITY VENOUS DOPPLER ULTRASOUND TECHNIQUE: Gray-scale sonography with compression, as well as color and duplex ultrasound, were performed to evaluate the deep venous system from the level of the common femoral vein through the popliteal and proximal calf veins. COMPARISON:  CT tib fib right 03/03/2019 FINDINGS: Normal compressibility of the common femoral, superficial femoral, and popliteal veins. There is hypoechoic occlusive thrombus in posterior tibial and peroneal veins. Survey views of the contralateral common femoral vein  are unremarkable. Morphologically unremarkable inguinal lymph nodes noted bilaterally. IMPRESSION: 1. POSITIVE for isolated right calf (posterior tibial and peroneal) DVT. Electronically Signed   By: Corlis Leak M.D.   On: 03/25/2019 16:11   DG Chest Portable 1 View  Result Date: 04/13/2019 CLINICAL DATA:  43 year old male with a history of chills and recent endocarditis EXAM: PORTABLE CHEST 1 VIEW COMPARISON:  02/24/2019, 02/22/2011 FINDINGS: Cardiomediastinal silhouette unchanged in size contour. Questionable reticulonodular opacity overlying the left cardiac border. No pneumothorax. No pleural effusion. IMPRESSION: Questionable reticulonodular opacity overlying the cardiac silhouette and the left heart border, potentially developing infection. Further evaluation with a formal PA and lateral chest x-ray may be useful, or alternatively CT. Electronically Signed   By: Gilmer Mor D.O.   On: 04/13/2019 14:34   DG Foot Complete Right  Result Date: 03/19/2019 CLINICAL DATA:  Peripheral/black appearance distal second toe. Evaluate for underlying osteomyelitis. EXAM: RIGHT FOOT COMPLETE - 3+ VIEW COMPARISON:  None. FINDINGS: Examination demonstrates no evidence of bone destruction to suggest osteomyelitis. There is no evidence of air within the soft tissues. Remaining bony structures and joint spaces are normal. IMPRESSION: No acute findings and no evidence of osteomyelitis. Electronically Signed  By: Elberta Fortis M.D.   On: 03/19/2019 16:21   ECHOCARDIOGRAM COMPLETE  Result Date: 04/14/2019   ECHOCARDIOGRAM REPORT   Patient Name:   Thomas [name] Date of Exam: 04/14/2019 Medical Rec #:  [id number]       Height: Accession #:    [id number]      Weight: Date of Birth:  [birth year]       BSA: Patient Age:    43 years        BP:           134/87 mmHg Patient Gender: M               HR:           102 bpm. Exam Location:  Inpatient Procedure: 2D Echo                             MODIFIED REPORT: This report was  modified by Lennie Odor MD on 04/14/2019 due to error.  Indications:     Bactermia  History:         Patient has no prior history of Echocardiogram examinations.                  Risk Factors:Diabetes, Hypertension, Dyslipidemia and Former                  Smoker.  Sonographer:     Jeryl Columbia Referring Phys:  [id number] Rich Reining DAM Diagnosing Phys: Lennie Odor MD IMPRESSIONS  1. No evidence of valvular vegetations on this study. If there is clinical suspicion for endocarditis, would recommend TEE to rule this out.  2. Left ventricular ejection fraction, by visual estimation, is 55 to 60%. The left ventricle has normal function. There is borderline left ventricular hypertrophy.  3. The left ventricle has no regional wall motion abnormalities.  4. Global right ventricle has normal systolic function.The right ventricular size is normal. No increase in right ventricular wall thickness.  5. Left atrial size was normal.  6. Right atrial size was normal.  7. Presence of pericardial fat pad.  8. The pericardial effusion is lateral to the left ventricle.  9. Trivial pericardial effusion is present. 10. The mitral valve is grossly normal. No evidence of mitral valve regurgitation. 11. The tricuspid valve is grossly normal. 12. The aortic valve is tricuspid. Aortic valve regurgitation is not visualized. No evidence of aortic valve sclerosis or stenosis. 13. The pulmonic valve was grossly normal. Pulmonic valve regurgitation is not visualized. 14. The inferior vena cava is normal in size with greater than 50% respiratory variability, suggesting right atrial pressure of 3 mmHg. 15. No prior Echocardiogram. 16. TR signal is inadequate for assessing pulmonary artery systolic pressure. FINDINGS  Left Ventricle: Left ventricular ejection fraction, by visual estimation, is 55 to 60%. The left ventricle has normal function. The left ventricle has no regional wall motion abnormalities. The left ventricular internal cavity  size was the left ventricle is normal in size. There is borderline left ventricular hypertrophy. Left ventricular diastolic parameters were normal. Normal left atrial pressure. Right Ventricle: The right ventricular size is normal. No increase in right ventricular wall thickness. Global RV systolic function is has normal systolic function. Left Atrium: Left atrial size was normal in size. Right Atrium: Right atrial size was normal in size Pericardium: Trivial pericardial effusion is present. The pericardial effusion is lateral to the left ventricle. Presence of pericardial fat pad. Mitral  Valve: The mitral valve is grossly normal. No evidence of mitral valve regurgitation. Tricuspid Valve: The tricuspid valve is grossly normal. Tricuspid valve regurgitation is trivial. Aortic Valve: The aortic valve is tricuspid. Aortic valve regurgitation is not visualized. The aortic valve is structurally normal, with no evidence of sclerosis or stenosis. Pulmonic Valve: The pulmonic valve was grossly normal. Pulmonic valve regurgitation is not visualized. Pulmonic regurgitation is not visualized. Aorta: The aortic root is normal in size and structure. Venous: The inferior vena cava is normal in size with greater than 50% respiratory variability, suggesting right atrial pressure of 3 mmHg. IAS/Shunts: No atrial level shunt detected by color flow Doppler.  LEFT VENTRICLE PLAX 2D LVIDd:         3.90 cm LVIDs:         3.30 cm LV PW:         1.10 cm LV IVS:        1.20 cm LVOT diam:     2.00 cm LV SV:         22 ml LVOT Area:     3.14 cm  RIGHT VENTRICLE RV S prime:     1290.00 cm/s TAPSE (M-mode): 2.4 cm LEFT ATRIUM           RIGHT ATRIUM LA diam:      3.60 cm RA Area:     12.30 cm LA Vol (A2C): 28.1 ml RA Volume:   29.30 ml LA Vol (A4C): 30.8 ml  AORTIC VALVE LVOT Vmax:   92.86 cm/s LVOT Vmean:  60.762 cm/s LVOT VTI:    0.154 m MITRAL VALVE MV Area (PHT): 4.36 cm               SHUNTS MV PHT:        50.46 msec             Systemic  VTI:  0.15 m MV Decel Time: 174 msec               Systemic Diam: 2.00 cm MV E velocity: 73.30 cm/s   103 cm/s MV A velocity: 6220.00 cm/s 70.3 cm/s MV E/A ratio:  0.01         1.5  Lennie OdorWesley O'Neal MD Electronically signed by Lennie OdorWesley O'Neal MD Signature Date/Time: 04/14/2019/1:51:06 PM    Final (Updated)    DG Knee 3 Views Left  Result Date: 03/25/2019 CLINICAL DATA:  Anterior left knee pain, status post recent knee surgery, assess for osteomyelitis. EXAM: LEFT KNEE - 3 VIEW COMPARISON:  None. FINDINGS: No evidence of fracture, dislocation. Small suprapatellar effusion is identified. No evidence of arthropathy or other focal bone abnormality. Soft tissues are unremarkable. IMPRESSION: No acute fracture or dislocation. Small suprapatellar effusion. No evidence of osteomyelitis. Electronically Signed   By: Sherian ReinWei-Chen  Lin M.D.   On: 03/25/2019 15:32    Labs:  CBC: Recent Labs    03/19/19 1420 04/13/19 1247 04/14/19 0424  WBC 11.8 Repeated and verified X2.* 15.2* 9.2  HGB 9.9* 9.4* 7.9*  HCT 31.1* 30.7* 25.1*  PLT 689.0 Repeated and verified X2.* 647* 528*    COAGS: Recent Labs    04/13/19 1247  INR 1.3*  APTT 43*    BMP: Recent Labs    11/26/18 0706 03/19/19 1420 04/13/19 1247 04/14/19 0424  NA 134* 133* 133* 134*  K 4.4 4.2 4.1 4.0  CL 99 96 97* 101  CO2 28 26 24 25   GLUCOSE 328* 226* 267* 200*  BUN 17 18 26* 17  CALCIUM 9.2 9.8 9.5 9.3  CREATININE 1.17 0.94 1.37* 0.84  GFRNONAA  --   --  >60 >60  GFRAA  --   --  >60 >60    LIVER FUNCTION TESTS: Recent Labs    08/14/18 0907 11/26/18 0706 03/19/19 1420 04/13/19 1247  BILITOT 0.4 0.3 0.5 0.4  AST 20 15 11  14*  ALT 38 30 13 25   ALKPHOS 112 91 95 84  PROT 7.0 6.5 8.7* 8.8*  ALBUMIN 4.0 3.9 3.7 3.0*    TUMOR MARKERS: No results for input(s): AFPTM, CEA, CA199, CHROMGRNA in the last 8760 hours.  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  Unfortunately patient was stated to be NPO by floor staff however he tells me today that he had scrambled eggs, jell-o and orange juice around 9 am today -- due to this we will only be able to offer fentanyl for the procedure which patient is agreeable to. If he is unable to tolerate with fentanyl only we will have to reschedule for a later date. Afebrile, WBC 9.2, hgb 7.9, plt 528.  Risks and benefits of image guided T9 aspiration was discussed with the patient and/or patient's family including, but not limited to bleeding, infection, damage to adjacent structures or low yield requiring additional tests.  All of the questions were answered and there is agreement to proceed.  Consent signed and in chart.  Thank you for this interesting consult.  I greatly enjoyed meeting Jalene Lacko and look forward to participating in their care.  A copy of this report was sent to the requesting provider on this date.  Electronically Signed: Joaquim Nam, PA-C 04/15/2019, 10:30 AM   I spent a total of 40 Minutes  in face to face in clinical consultation, greater than 50% of which was counseling/coordinating care for T9 disc aspiration.

## 2019-04-15 NOTE — Procedures (Signed)
Pre procedural Dx: Discitis/Osteomyelitis Post procedural Dx: Same  Attempted though unsuccessful T8-T9 vertebral disc aspiration.  Technically successful fluoro guided T9 vertebral body aspiration and biopsy.  A representative aspirated sample was capped and sent to the laboratory for analysis.    EBL: Trace Complications: None immediate  Ronny Bacon, MD Pager #: [phone]

## 2019-04-16 ENCOUNTER — Inpatient Hospital Stay (HOSPITAL_COMMUNITY): Payer: Medicaid Other

## 2019-04-16 DIAGNOSIS — I059 Rheumatic mitral valve disease, unspecified: Secondary | ICD-10-CM | POA: Diagnosis present

## 2019-04-16 DIAGNOSIS — B9561 Methicillin susceptible Staphylococcus aureus infection as the cause of diseases classified elsewhere: Secondary | ICD-10-CM

## 2019-04-16 DIAGNOSIS — R7881 Bacteremia: Secondary | ICD-10-CM | POA: Diagnosis present

## 2019-04-16 DIAGNOSIS — M546 Pain in thoracic spine: Secondary | ICD-10-CM | POA: Insufficient documentation

## 2019-04-16 DIAGNOSIS — I058 Other rheumatic mitral valve diseases: Secondary | ICD-10-CM | POA: Diagnosis present

## 2019-04-16 DIAGNOSIS — B9562 Methicillin resistant Staphylococcus aureus infection as the cause of diseases classified elsewhere: Secondary | ICD-10-CM | POA: Diagnosis present

## 2019-04-16 DIAGNOSIS — M4645 Discitis, unspecified, thoracolumbar region: Secondary | ICD-10-CM | POA: Diagnosis present

## 2019-04-16 LAB — CULTURE, BLOOD (ROUTINE X 2)
Special Requests: ADEQUATE
Special Requests: ADEQUATE

## 2019-04-16 LAB — BASIC METABOLIC PANEL
Anion gap: 13 (ref 5–15)
BUN: 7 mg/dL (ref 6–20)
CO2: 23 mmol/L (ref 22–32)
Calcium: 9.2 mg/dL (ref 8.9–10.3)
Chloride: 99 mmol/L (ref 98–111)
Creatinine, Ser: 1.01 mg/dL (ref 0.61–1.24)
GFR calc Af Amer: >60 mL/min (ref >60)
GFR calc non Af Amer: >60 mL/min (ref >60)
Glucose, Bld: 124 mg/dL — ABNORMAL HIGH (ref 70–99)
Potassium: 3.7 mmol/L (ref 3.5–5.1)
Sodium: 135 mmol/L (ref 135–145)

## 2019-04-16 LAB — GLUCOSE, CAPILLARY
Glucose-Capillary: 118 mg/dL — ABNORMAL HIGH (ref 70–99)
Glucose-Capillary: 125 mg/dL — ABNORMAL HIGH (ref 70–99)
Glucose-Capillary: 152 mg/dL — ABNORMAL HIGH (ref 70–99)
Glucose-Capillary: 164 mg/dL — ABNORMAL HIGH (ref 70–99)
Glucose-Capillary: 173 mg/dL — ABNORMAL HIGH (ref 70–99)
Glucose-Capillary: 208 mg/dL — ABNORMAL HIGH (ref 70–99)

## 2019-04-16 LAB — CBC
HCT: 30.3 % — ABNORMAL LOW (ref 39.0–52.0)
Hemoglobin: 9.5 g/dL — ABNORMAL LOW (ref 13.0–17.0)
MCH: 24.2 pg — ABNORMAL LOW (ref 26.0–34.0)
MCHC: 31.4 g/dL (ref 30.0–36.0)
MCV: 77.3 fL — ABNORMAL LOW (ref 80.0–100.0)
Platelets: 481 10*3/uL — ABNORMAL HIGH (ref 150–400)
RBC: 3.92 MIL/uL — ABNORMAL LOW (ref 4.22–5.81)
RDW: 14.2 % (ref 11.5–15.5)
WBC: 7.2 10*3/uL (ref 4.0–10.5)
nRBC: 0 % (ref 0.0–0.2)

## 2019-04-16 LAB — HEPATITIS C ANTIBODY (REFLEX): HCV Ab: 0.2 s/co ratio (ref 0.0–0.9)

## 2019-04-16 LAB — HCV COMMENT:

## 2019-04-16 IMAGING — MR MR FOOT*R* WO/W CM
9 series · 40 of 40 positions shown · IV contrast (gadavist)
Comparison: Right foot x-ray [DATE]

CLINICAL DATA: Sepsis, peripheral vascular disease, skin changes of
the toes

EXAM:
MRI OF THE RIGHT FOREFOOT WITHOUT AND WITH CONTRAST
TECHNIQUE: Multiplanar, multisequence MR imaging of the right forefoot was
performed before and after the administration of intravenous
contrast.
CONTRAST:  9mL GADAVIST GADOBUTROL 1 MMOL/ML IV SOLN

[Series 4: T1 · coronal · right · 4.0mm · 0.47mm/px · 6 of 36 slices shown (1 of 2)]
[im 1/36]
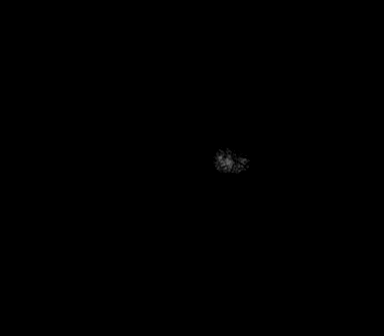
[im 8/36]
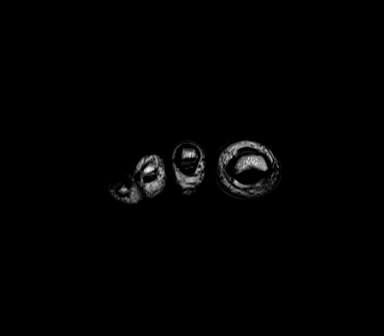
[im 15/36]
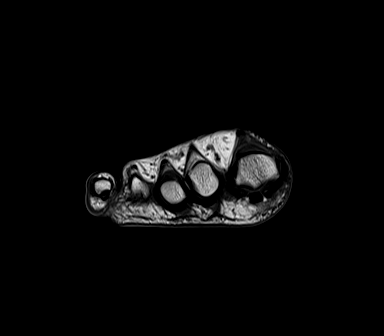
[im 22/36]
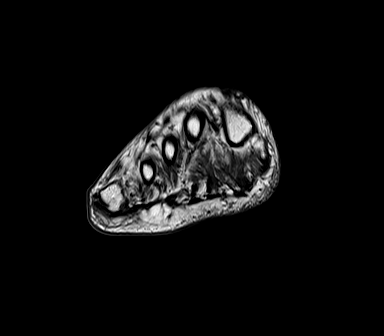
[im 29/36]
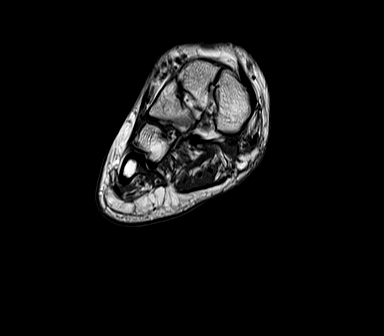
[im 36/36]
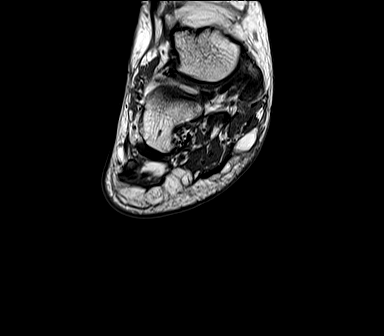

[Series 5: T2 fat-sat · coronal · right · 4.0mm · 0.49mm/px · 6 of 36 slices shown (1 of 2)]
[im 1/36]
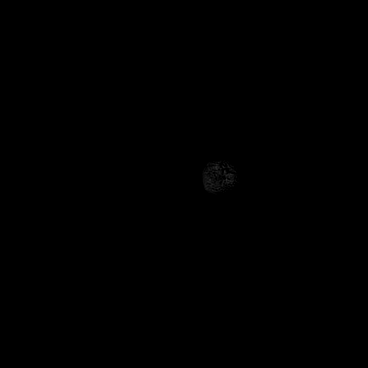
[im 8/36]
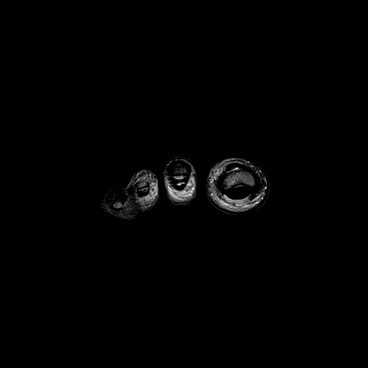
[im 15/36]
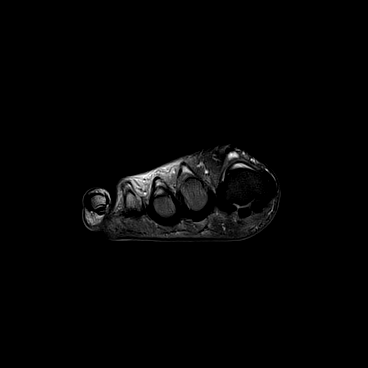
[im 22/36]
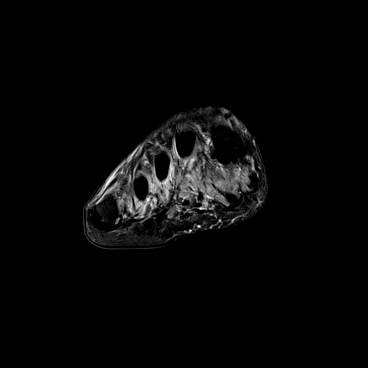
[im 29/36]
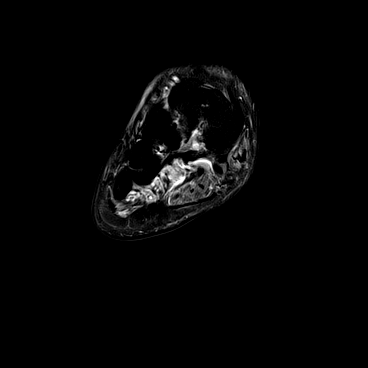
[im 36/36]
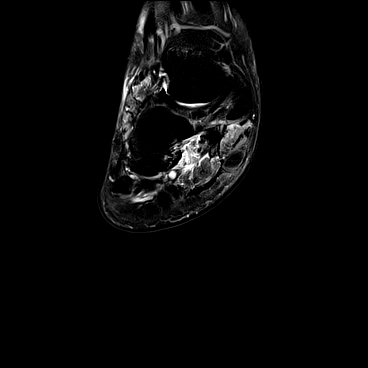

[Series 6: T1 · axial · right · 3.0mm · 0.52mm/px · z∈[-97,-26]mm · 4 of 20 slices shown (2 of 2)]
[im 1/20]
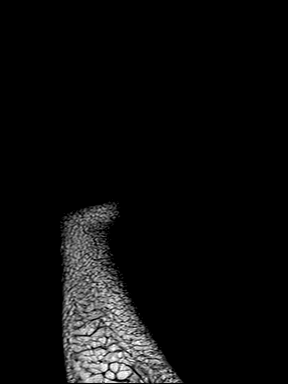
[im 7/20]
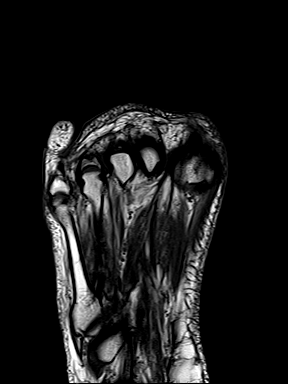
[im 13/20]
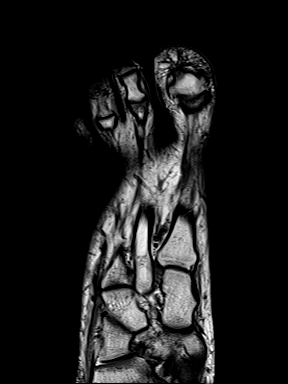
[im 20/20]
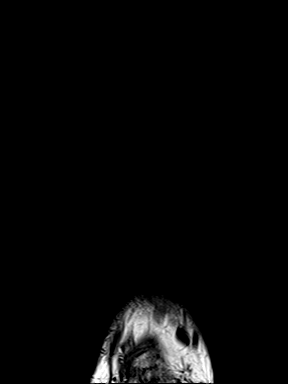

[Series 7: T2 fat-sat · axial · right · 3.0mm · 0.60mm/px · z∈[-99,-28]mm · 3 of 20 slices shown (2 of 2)]
[im 1/20]
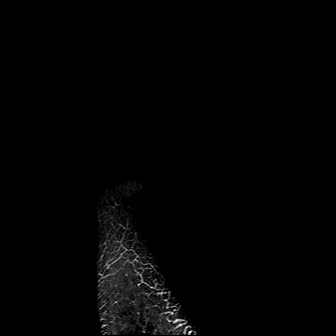
[im 10/20]
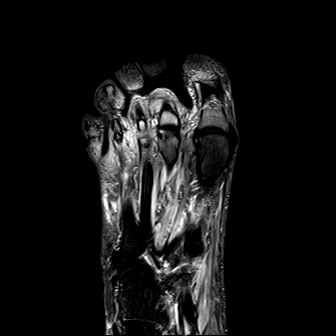
[im 20/20]
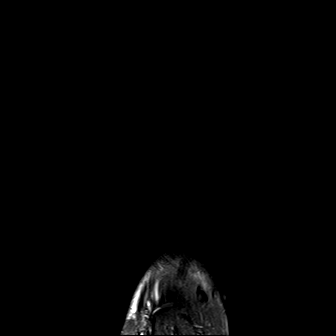

[Series 8: STIR · sagittal · right · 3.0mm · 0.70mm/px · 4 of 29 slices shown]
[im 1/29]
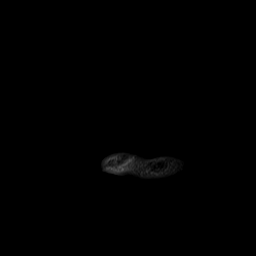
[im 10/29]
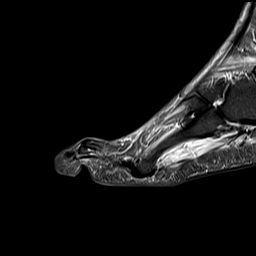
[im 19/29]
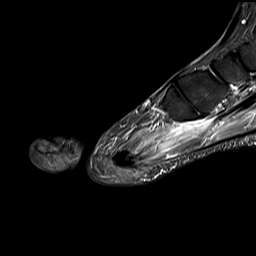
[im 29/29]
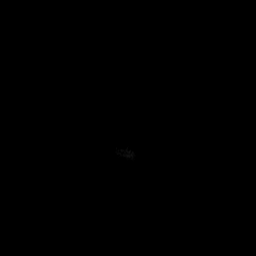

[Series 9: T1 fat-sat · coronal · non-contrast · right · 4.0mm · 0.59mm/px · 5 of 36 slices shown (1 of 3)]
[im 1/36]
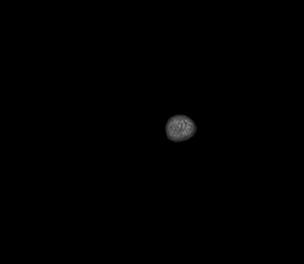
[im 9/36]
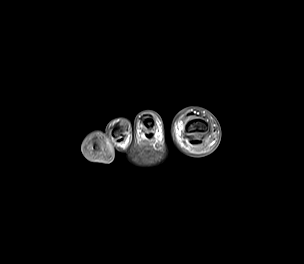
[im 18/36]
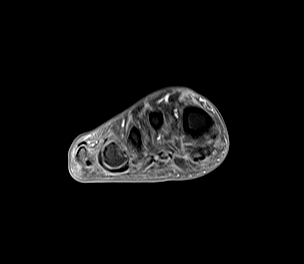
[im 27/36]
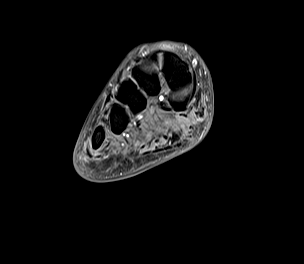
[im 36/36]
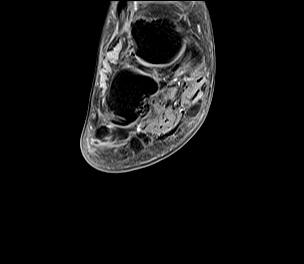

[Series 10: T1 fat-sat post-contrast · coronal · right · 4.0mm · 0.59mm/px · 5 of 36 slices shown]
[im 1/36]
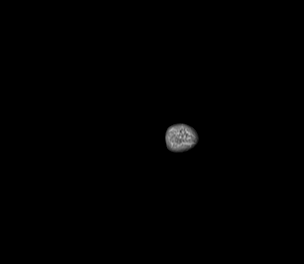
[im 9/36]
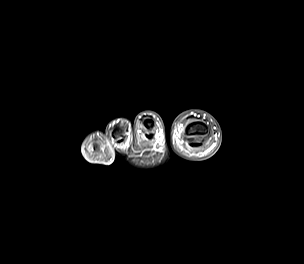
[im 18/36]
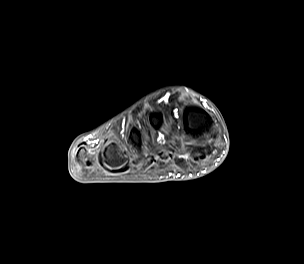
[im 27/36]
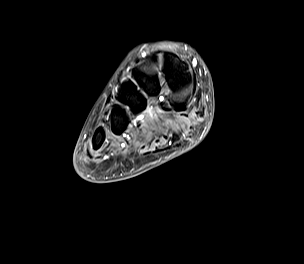
[im 36/36]
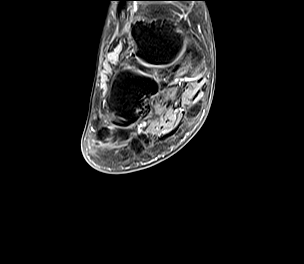

[Series 11: T1 fat-sat · axial · right · 3.0mm · 0.56mm/px · z∈[-95,-24]mm · 3 of 20 slices shown (2 of 3)]
[im 1/20]
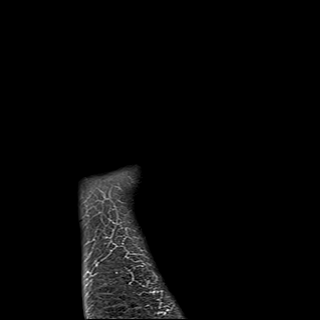
[im 10/20]
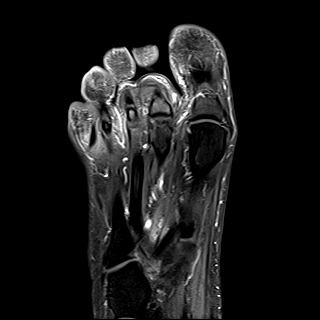
[im 20/20]
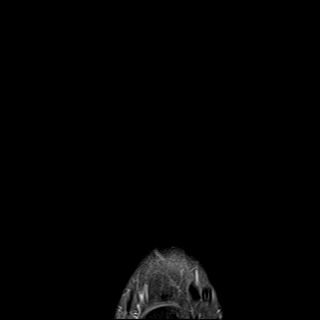

[Series 12: T1 fat-sat · sagittal · right · 3.0mm · 0.59mm/px · 4 of 29 slices shown (3 of 3)]
[im 1/29]
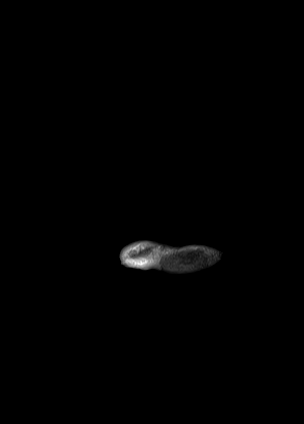
[im 10/29]
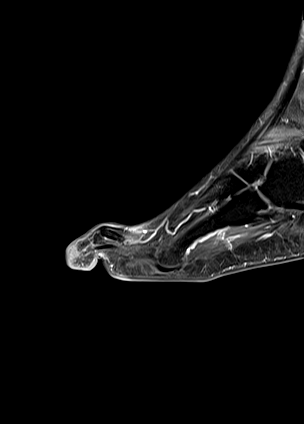
[im 19/29]
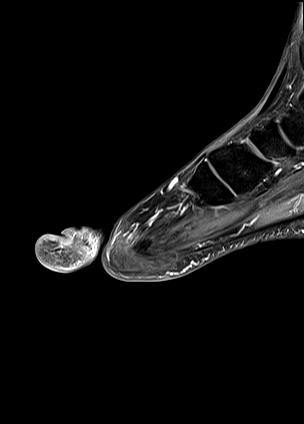
[im 29/29]
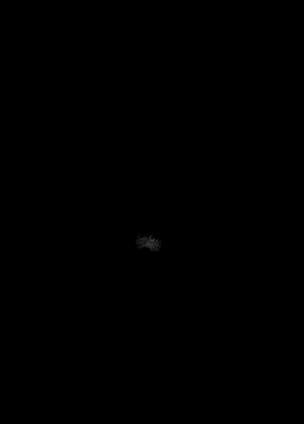

[40 of 40 positions shown; findings below may reference images not displayed]

FINDINGS: Bones/Joint/Cartilage

There is bone marrow edema and enhancement within the distal
phalanxes of the second and third digits (series 8, images 9 and
13). Soft tissue defect over the the distal aspect of the second toe
distal phalanx with intermediate T1 signal, suspicious for
osteomyelitis. Preserved T1 marrow signal within the third digit
distal phalanx. The remaining osseous structures of the forefoot
maintain a normal fatty T1 marrow signal. No acute fractures. No
dislocation. No joint effusions.

Ligaments

Intrinsic foot ligaments including the Lisfranc ligament are intact.

Muscles and Tendons

Diffuse intramuscular edema within the intrinsic forefoot
musculature suggesting denervation changes versus myositis. No
intramuscular fluid collection. Tendons are intact. No tenosynovial
fluid collection.

Soft tissues

Soft tissue defect distal to the second toe distal phalanx. No soft
tissue fluid collections.
IMPRESSION: 1. Soft tissue defect over the distal aspect of the second toe
distal phalanx with underlying bone marrow edema and enhancement,
suspicious for osteomyelitis.
2. Bone marrow edema and enhancement within the third toe distal
phalanx with preserved T1 marrow signal suggesting a reactive
osteitis.
3. Diffuse intramuscular edema within the intrinsic forefoot
musculature suggesting denervation changes versus myositis.

## 2019-04-16 MED ORDER — GADOBUTROL 1 MMOL/ML IV SOLN
9.0000 mL | Freq: Once | INTRAVENOUS | Status: AC | PRN
  Administered 2019-04-16: 13:00:00 9 mL via INTRAVENOUS

## 2019-04-16 NOTE — Evaluation (Signed)
Occupational Therapy Evaluation Patient Details Name: Thomas [name] MRN: [id number] DOB: Aug 04, 1975 Today's Date: 04/16/2019    History of Present Illness Patient is a 44 y/o male who presents with back pain, dizziness and fall at home. Recent long hospital stay at [facility] for disseminated MRSA bacteremia, osteomyelitis and discitis. Chest CT, abdomen and pelvis notable for findings of advanced osteomyelitis involving T9 as well as discitis T8-9, T9-10. s/p successful fluoro guided T9 vertebral body aspiration and biopsy 12/31. Positive blood cultures for MRSA. Also admitted for necrosis of right toes 1-5. Plan for TEE next week.   Clinical Impression   PTA, pt was living alone and reports his friend would visit to assist with LB ADLs and IADLs. Pt also using RW with functional mobility. Pt currently requiring Min Guard A for LB ADLs and functional mobility with RW. Pt very motivated to participate in therapy. Provided education for compensatory techniques with back precautions. HR elevating to 140 with home distance mobility. Pt would benefit from further acute OT to facilitate safe dc. Recommend dc to home with HHOT for further OT to optimize safety, independence with ADLs, and return to PLOF.      Follow Up Recommendations  Home health OT;Supervision/Assistance - 24 hour    Equipment Recommendations  None recommended by OT    Recommendations for Other Services PT consult     Precautions / Restrictions Precautions Precautions: Fall Precaution Comments: watch HR Restrictions Weight Bearing Restrictions: No      Mobility Bed Mobility Overal bed mobility: Needs Assistance Bed Mobility: Rolling;Sidelying to Sit Rolling: Supervision Sidelying to sit: HOB elevated;Supervision       General bed mobility comments: Cues for log roll technique, use of rail, slow.  Transfers Overall transfer level: Needs assistance Equipment used: Rolling walker (2 wheeled) Transfers:  Sit to/from Stand Sit to Stand: Min guard         General transfer comment: Min guard for safety. Stood from [employer], transferred to chair post ambulation.    Balance Overall balance assessment: Needs assistance;History of Falls Sitting-balance support: Feet supported;No upper extremity supported Sitting balance-Leahy Scale: Good     Standing balance support: During functional activity Standing balance-Leahy Scale: Fair Standing balance comment: Able to perform ADL tasks at sink with close min guard.                           ADL either performed or assessed with clinical judgement   ADL Overall ADL's : Needs assistance/impaired Eating/Feeding: Set up;Sitting   Grooming: Min guard;Oral care;Standing Grooming Details (indicate cue type and reason): Min Guard A for safety Upper Body Bathing: Set up;Sitting   Lower Body Bathing: Min guard;Sit to/from stand   Upper Body Dressing : Set up;Sitting   Lower Body Dressing: Min guard;Sit to/from stand Lower Body Dressing Details (indicate cue type and reason): Educating pt on compensatory techniques for back precautions. Pt donning socks by bringing ankles to knees Toilet Transfer: Min guard;Ambulation;RW(simulated to recliner)           Functional mobility during ADLs: Min guard;Rolling walker General ADL Comments: Pt presenting with decreased strength, balance, and activity tolerance     Vision         Perception     Praxis      Pertinent Vitals/Pain Pain Assessment: Faces Faces Pain Scale: Hurts little more Pain Location: BLE and back Pain Descriptors / Indicators: Constant;Discomfort Pain Intervention(s): Limited activity within patient's tolerance;Monitored during  session;Repositioned     Hand Dominance Right   Extremity/Trunk Assessment Upper Extremity Assessment Upper Extremity Assessment: Overall WFL for tasks assessed   Lower Extremity Assessment Lower Extremity Assessment: Defer to PT  evaluation   Cervical / Trunk Assessment Cervical / Trunk Assessment: Other exceptions Cervical / Trunk Exceptions: s/p successful fluoro guided T9 vertebral body aspiration and biopsy 12/31   Communication Communication Communication: Prefers language other than English   Cognition Arousal/Alertness: Awake/alert Behavior During Therapy: WFL for tasks assessed/performed Overall Cognitive Status: Within Functional Limits for tasks assessed                                     General Comments  HR up to 140 bpm during activity.    Exercises     Shoulder Instructions      Home Living Family/patient expects to be discharged to:: Private residence Living Arrangements: Alone Available Help at Discharge: Friend(s);Available PRN/intermittently Type of Home: House Home Access: Stairs to enter [employer] of Steps: 5 Entrance Stairs-Rails: Can reach both Home Layout: One level     Bathroom Shower/Tub: Chief Strategy Officer: Standard     Home Equipment: Environmental consultant - 2 wheels;Shower seat          Prior Functioning/Environment Level of Independence: Needs assistance  Gait / Transfers Assistance Needed: Uses RW for mobility since d/c from hospital late November. Fell in bathroom due to dizziness. ADL's / Homemaking Assistance Needed: Friend assist with driving, LB bathing, and brings food   Comments: Recent fall in shower. Last hospital stay 10/17-11/24        OT Problem List: Decreased strength;Decreased range of motion;Decreased activity tolerance;Impaired balance (sitting and/or standing);Decreased safety awareness;Decreased knowledge of use of DME or AE;Decreased knowledge of precautions;Pain      OT Treatment/Interventions: Self-care/ADL training;Therapeutic exercise;Energy conservation;DME and/or AE instruction;Therapeutic activities;Patient/family education    OT Goals(Current goals can be found in the care plan section) Acute Rehab  OT Goals Patient Stated Goal: to get better and go home OT Goal Formulation: With patient Time For Goal Achievement: 04/30/19 Potential to Achieve Goals: Good  OT Frequency: Min 2X/week   Barriers to D/C:            Co-evaluation PT/OT/SLP Co-Evaluation/Treatment: Yes Reason for Co-Treatment: For patient/therapist safety PT goals addressed during session: Mobility/safety with mobility OT goals addressed during session: ADL's and self-care      AM-PAC OT "6 Clicks" Daily Activity     Outcome Measure Help from another person eating meals?: None Help from another person taking care of personal grooming?: None Help from another person toileting, which includes using toliet, bedpan, or urinal?: A Little Help from another person bathing (including washing, rinsing, drying)?: A Little Help from another person to put on and taking off regular upper body clothing?: None Help from another person to put on and taking off regular lower body clothing?: A Little 6 Click Score: 21   End of Session Equipment Utilized During Treatment: Rolling walker Nurse Communication: Mobility status  Activity Tolerance: Patient tolerated treatment well Patient left: in chair;with call bell/phone within reach;with chair alarm set  OT Visit Diagnosis: Unsteadiness on feet (R26.81);Other abnormalities of gait and mobility (R26.89);Muscle weakness (generalized) (M62.81)                Time: 1610-9604 OT Time Calculation (min): 29 min Charges:  OT General Charges $OT Visit: 1 Visit OT  Evaluation $OT Eval Moderate Complexity: 1 Mod  Shaleigh Laubscher MSOT, OTR/L Acute Rehab Pager: [phone] Office: [phone]  Theodoro Grist Latresha Yahr 04/16/2019, 1:09 PM

## 2019-04-16 NOTE — Progress Notes (Signed)
Subjective: No new complaints   Antibiotics:  Anti-infectives (From admission, onward)   Start     Dose/Rate Route Frequency Ordered Stop   04/14/19 1600  vancomycin (VANCOREADY) IVPB 1500 mg/300 mL     1,500 mg 150 mL/hr over 120 Minutes Intravenous Every 12 hours 04/14/19 1240     04/13/19 2200  ceFEPIme (MAXIPIME) 2 g in sodium chloride 0.9 % 100 mL IVPB  Status:  Discontinued     2 g 200 mL/hr over 30 Minutes Intravenous Every 8 hours 04/13/19 1428 04/14/19 1028   04/13/19 1430  vancomycin (VANCOCIN) IVPB 1000 mg/200 mL premix  Status:  Discontinued     1,000 mg 200 mL/hr over 60 Minutes Intravenous Every 12 hours 04/13/19 1428 04/14/19 1240   04/13/19 1415  ceFEPIme (MAXIPIME) 2 g in sodium chloride 0.9 % 100 mL IVPB     2 g 200 mL/hr over 30 Minutes Intravenous  Once 04/13/19 1411 04/13/19 1505   04/13/19 1415  metroNIDAZOLE (FLAGYL) IVPB 500 mg     500 mg 100 mL/hr over 60 Minutes Intravenous  Once 04/13/19 1411 04/13/19 1600   04/13/19 1415  vancomycin (VANCOCIN) IVPB 1000 mg/200 mL premix  Status:  Discontinued     1,000 mg 200 mL/hr over 60 Minutes Intravenous  Once 04/13/19 1411 04/13/19 1428      Medications: Scheduled Meds: . Chlorhexidine Gluconate Cloth  6 each Topical Q0600  . insulin aspart  0-9 Units Subcutaneous Q4H  . insulin glargine  15 Units Subcutaneous QHS  . mupirocin ointment  1 application Nasal BID  . pneumococcal 23 valent vaccine  0.5 mL Intramuscular Tomorrow-1000  . senna-docusate  2 tablet Oral BID  . sodium chloride flush  3 mL Intravenous Once   Continuous Infusions: . sodium chloride 250 mL (04/13/19 2351)  . sodium chloride Stopped (04/13/19 2230)  . lactated ringers 100 mL/hr at 04/15/19 2141  . vancomycin 1,500 mg (04/16/19 0640)   PRN Meds:.sodium chloride, sodium chloride, acetaminophen, HYDROmorphone (DILAUDID) injection, ibuprofen, ondansetron **OR** ondansetron (ZOFRAN) IV    Objective: Weight change: -1.188  kg  Intake/Output Summary (Last 24 hours) at 04/16/2019 1247 Last data filed at 04/16/2019 0500 Gross per 24 hour  Intake 240 ml  Output 960 ml  Net -720 ml   Blood pressure 133/89, pulse (!) 109, temperature 99.4 F (37.4 C), temperature source Oral, resp. rate 16, height '5\' 10"'  (1.778 m), weight 87.4 kg, SpO2 98 %. Temp:  [97.8 F (36.6 C)-99.5 F (37.5 C)] 99.4 F (37.4 C) (01/01 1144) Pulse Rate:  [90-118] 109 (01/01 1144) Resp:  [16-28] 16 (01/01 0500) BP: (105-155)/(64-91) 133/89 (01/01 1144) SpO2:  [96 %-99 %] 98 % (01/01 1144) Weight:  [87.4 kg] 87.4 kg (01/01 0500)  Physical Exam: General: Alert and awake, oriented x3, not in any acute distress. HEENT: anicteric sclera, EOMI CVS regular rate, normal  Chest: , no wheezing, no respiratory distress Abdomen: soft non-distended,  Extremities: no edema or deformity noted bilaterally Skin: no rashes Neuro: nonfocal  CBC:    BMET Recent Labs    04/14/19 0424 04/16/19 0630  NA 134* 135  K 4.0 3.7  CL 101 99  CO2 25 23  GLUCOSE 200* 124*  BUN 17 7  CREATININE 0.84 1.01  CALCIUM 9.3 9.2     Liver Panel  No results for input(s): PROT, ALBUMIN, AST, ALT, ALKPHOS, BILITOT, BILIDIR, IBILI in the last 72 hours.     Sedimentation Rate Recent  Labs    04/15/19 0352  ESRSEDRATE >140*   C-Reactive Protein Recent Labs    04/15/19 0352  CRP 19.4*    Micro Results: Recent Results (from the past 720 hour(s))  Blood culture (routine x 2)     Status: Abnormal   Collection Time: 04/13/19 12:45 PM   Specimen: BLOOD  Result Value Ref Range Status   Specimen Description   Final    BLOOD RIGHT ANTECUBITAL Performed at [facility], [address] [place], [place] [address]    Special Requests   Final    BOTTLES DRAWN AEROBIC AND ANAEROBIC Blood Culture adequate volume Performed at [facility], [address] [place], [place] [address]    Culture  Setup Time   Final    GRAM POSITIVE  COCCI IN CLUSTERS AEROBIC BOTTLE ONLY CRITICAL RESULT CALLED TO, READ BACK BY AND VERIFIED WITH: M. [place], AT [phone] 04/15/19 BY D. VANHOOK    Culture (A)  Final    STAPHYLOCOCCUS AUREUS SUSCEPTIBILITIES PERFORMED ON PREVIOUS CULTURE WITHIN THE LAST 5 DAYS. Performed at [facility], [place] [address] [place], [place] [address]    Report Status 04/16/2019 FINAL  Final  Blood Culture ID Panel (Reflexed)     Status: Abnormal   Collection Time: 04/13/19 12:45 PM  Result Value Ref Range Status   Enterococcus species NOT DETECTED NOT DETECTED Final   Listeria monocytogenes NOT DETECTED NOT DETECTED Final   Staphylococcus species DETECTED (A) NOT DETECTED Final    Comment: CRITICAL RESULT CALLED TO, READ BACK BY AND VERIFIED WITH: M. [place], AT [phone] 04/15/19 BY D. VANHOOK    Staphylococcus aureus (BCID) DETECTED (A) NOT DETECTED Final    Comment: Methicillin (oxacillin)-resistant Staphylococcus aureus (MRSA). MRSA is predictably resistant to beta-lactam antibiotics (except ceftaroline). Preferred therapy is vancomycin unless clinically contraindicated. Patient requires contact precautions if  hospitalized. CRITICAL RESULT CALLED TO, READ BACK BY AND VERIFIED WITH: [place], AT [phone] 04/15/19 BY D. VANHOOK    Methicillin resistance DETECTED (A) NOT DETECTED Final    Comment: CRITICAL RESULT CALLED TO, READ BACK BY AND VERIFIED WITH: Hughie Closs PHARMD, AT [phone] 04/15/19 BY D. VANHOOK    Streptococcus species NOT DETECTED NOT DETECTED Final   Streptococcus agalactiae NOT DETECTED NOT DETECTED Final   Streptococcus pneumoniae NOT DETECTED NOT DETECTED Final   Streptococcus pyogenes NOT DETECTED NOT DETECTED Final   Acinetobacter baumannii NOT DETECTED NOT DETECTED Final   Enterobacteriaceae species NOT DETECTED NOT DETECTED Final   Enterobacter cloacae complex NOT DETECTED NOT DETECTED Final   Escherichia coli NOT DETECTED NOT DETECTED Final   Klebsiella oxytoca NOT  DETECTED NOT DETECTED Final   Klebsiella pneumoniae NOT DETECTED NOT DETECTED Final   Proteus species NOT DETECTED NOT DETECTED Final   Serratia marcescens NOT DETECTED NOT DETECTED Final   Haemophilus influenzae NOT DETECTED NOT DETECTED Final   Neisseria meningitidis NOT DETECTED NOT DETECTED Final   Pseudomonas aeruginosa NOT DETECTED NOT DETECTED Final   Candida albicans NOT DETECTED NOT DETECTED Final   Candida glabrata NOT DETECTED NOT DETECTED Final   Candida krusei NOT DETECTED NOT DETECTED Final   Candida parapsilosis NOT DETECTED NOT DETECTED Final   Candida tropicalis NOT DETECTED NOT DETECTED Final    Comment: Performed at [facility], [place] [place], [place] [address]  SARS Coronavirus 2 Ag (30 min TAT) - Nasal Swab (BD Veritor Kit)     Status: None  Collection Time: 04/13/19 12:52 PM   Specimen: Nasal Swab (BD Veritor Kit)  Result Value Ref Range Status   SARS Coronavirus 2 Ag NEGATIVE NEGATIVE Final    Comment: (NOTE) SARS-CoV-2 antigen NOT DETECTED.  Negative results are presumptive.  Negative results do not preclude SARS-CoV-2 infection and should not be used as the sole basis for treatment or other patient management decisions, including infection  control decisions, particularly in the presence of clinical signs and  symptoms consistent with COVID-19, or in those who have been in contact with the virus.  Negative results must be combined with clinical observations, patient history, and epidemiological information. The expected result is Negative. Fact Sheet for Patients: PodPark.tn Fact Sheet for Healthcare Providers: GiftContent.is This test is not yet approved or cleared by the [place] FDA and  has been authorized for detection and/or diagnosis of SARS-CoV-2 by FDA under an Emergency Use Authorization (EUA).  This EUA will remain in effect (meaning this test can be used) for the  duration of  the COVID-19 de claration under Section 564(b)(1) of the Act, 21 U.S.C. section 360bbb-3(b)(1), unless the authorization is terminated or revoked sooner. Performed at [facility], [address] [place], [place] [address]   Blood culture (routine x 2)     Status: Abnormal   Collection Time: 04/13/19 12:56 PM   Specimen: BLOOD  Result Value Ref Range Status   Specimen Description   Final    BLOOD LEFT ANTECUBITAL Performed at [facility], [address] [place], [place] [address]    Special Requests   Final    BOTTLES DRAWN AEROBIC AND ANAEROBIC Blood Culture adequate volume Performed at [facility], [address] [place], [place] [address]    Culture  Setup Time   Final    GRAM POSITIVE COCCI IN BOTH AEROBIC AND ANAEROBIC BOTTLES CRITICAL RESULT CALLED TO, READ BACK BY AND VERIFIED WITH: PHARND J Iran [id number] AT 950 AM BY CM Performed at [facility], [place] [address] [place], [place] [address]    Culture METHICILLIN RESISTANT STAPHYLOCOCCUS AUREUS (A)  Final   Report Status 04/16/2019 FINAL  Final   Organism ID, Bacteria METHICILLIN RESISTANT STAPHYLOCOCCUS AUREUS  Final      Susceptibility   Methicillin resistant staphylococcus aureus - MIC*    CIPROFLOXACIN >=8 RESISTANT Resistant     ERYTHROMYCIN >=8 RESISTANT Resistant     GENTAMICIN <=0.5 SENSITIVE Sensitive     OXACILLIN >=4 RESISTANT Resistant     TETRACYCLINE <=1 SENSITIVE Sensitive     VANCOMYCIN <=0.5 SENSITIVE Sensitive     TRIMETH/SULFA <=10 SENSITIVE Sensitive     CLINDAMYCIN >=8 RESISTANT Resistant     RIFAMPIN <=0.5 SENSITIVE Sensitive     Inducible Clindamycin NEGATIVE Sensitive     * METHICILLIN RESISTANT STAPHYLOCOCCUS AUREUS  SARS CORONAVIRUS 2 (TAT 6-24 HRS) Nasopharyngeal Nasopharyngeal Swab     Status: None   Collection Time: 04/13/19  3:31 PM   Specimen: Nasopharyngeal Swab  Result Value Ref Range Status   SARS Coronavirus 2 NEGATIVE  NEGATIVE Final    Comment: (NOTE) SARS-CoV-2 target nucleic acids are NOT DETECTED. The SARS-CoV-2 RNA is generally detectable in upper and lower respiratory specimens during the acute phase of infection. Negative results do not preclude SARS-CoV-2 infection, do not rule out co-infections with other pathogens, and should not be used as the sole basis for treatment or other patient management decisions. Negative results must be combined with  clinical observations, patient history, and epidemiological information. The expected result is Negative. Fact Sheet for Patients: SugarRoll.be Fact Sheet for Healthcare Providers: https://www.woods-mathews.com/ This test is not yet approved or cleared by the [place] FDA and  has been authorized for detection and/or diagnosis of SARS-CoV-2 by FDA under an Emergency Use Authorization (EUA). This EUA will remain  in effect (meaning this test can be used) for the duration of the COVID-19 declaration under Section 56 4(b)(1) of the Act, 21 U.S.C. section 360bbb-3(b)(1), unless the authorization is terminated or revoked sooner. Performed at [facility], [place] [address], [place], [place] [address]   Urine culture     Status: None   Collection Time: 04/13/19  7:45 PM   Specimen: Urine, Random  Result Value Ref Range Status   Specimen Description   Final    URINE, RANDOM Performed at [facility], [address] [place], [place] [address]    Special Requests   Final    NONE Performed at [facility], [address] [place], [place] [address]    Culture   Final    NO GROWTH Performed at [facility], [place] [address], [place], [place] [address]    Report Status 04/14/2019 FINAL  Final  Surgical pcr screen     Status: Abnormal   Collection Time: 04/14/19  4:42 AM   Specimen: Nasal Mucosa; Nasal Swab  Result Value Ref Range Status   MRSA, PCR POSITIVE (A) NEGATIVE  Final    Comment: RESULT CALLED TO, READ BACK BY AND VERIFIED WITH: C JOHNSON ON 123020 AT 1143 BY NFIELDS    Staphylococcus aureus POSITIVE (A) NEGATIVE Final    Comment: (NOTE) The Xpert SA Assay (FDA approved for NASAL specimens in patients 15 years of age and older), is one component of a comprehensive surveillance program. It is not intended to diagnose infection nor to guide or monitor treatment. Performed at [facility], [place] [address], [place], [place] [address]   Culture, blood (routine x 2)     Status: None (Preliminary result)   Collection Time: 04/14/19 12:31 PM   Specimen: BLOOD RIGHT HAND  Result Value Ref Range Status   Specimen Description BLOOD RIGHT HAND  Final   Special Requests   Final    BOTTLES DRAWN AEROBIC AND ANAEROBIC Blood Culture adequate volume   Culture   Final    NO GROWTH 1 DAY Performed at [facility], [place] [address], [place],  [address]    Report Status PENDING  Incomplete  Culture, blood (routine x 2)     Status: None (Preliminary result)   Collection Time: 04/14/19 12:46 PM   Specimen: BLOOD LEFT HAND  Result Value Ref Range Status   Specimen Description BLOOD LEFT HAND  Final   Special Requests   Final    BOTTLES DRAWN AEROBIC ONLY Blood Culture results may not be optimal due to an inadequate volume of blood received in culture bottles   Culture   Final    NO GROWTH 1 DAY Performed at [facility], [place] [address] [place],  [address]    Report Status PENDING  Incomplete  Aerobic/Anaerobic Culture (surgical/deep wound)     Status: None (Preliminary result)   Collection Time: 04/15/19  1:45 PM   Specimen: A: PATH Bone biopsy   B: PATH Other; Abscess  Result Value Ref Range Status   Specimen Description BONE BIOPSY T9  Final   Special Requests NONE  Final   Gram Stain PENDING  Incomplete   Culture   Final    NO GROWTH < 24 HOURS Performed at [facility], [place] [address], [place], [place]  [address]    Report Status PENDING  Incomplete  Aerobic/Anaerobic Culture (surgical/deep wound)     Status: None (Preliminary result)   Collection Time: 04/15/19  1:53 PM   Specimen: Abscess; Tissue  Result Value Ref Range Status   Specimen Description ABSCESS T9 DISC ASPIRATION 2  Final   Special Requests Normal  Final   Gram Stain PENDING  Incomplete   Culture   Final    NO GROWTH < 24 HOURS Performed at [facility], [place] [address], [place], [place] [address]    Report Status PENDING  Incomplete  Aerobic/Anaerobic Culture (surgical/deep wound)     Status: None (Preliminary result)   Collection Time: 04/15/19  1:53 PM   Specimen: Abscess  Result Value Ref Range Status   Specimen Description ABSCESS T9 DISC ASPIRATION  Final   Special Requests   Final    Normal Performed at [facility], [place] [address] [place], [place] [address]    Gram Stain PENDING  Incomplete   Culture RARE STAPHYLOCOCCUS AUREUS  Final   Report Status PENDING  Incomplete    Studies/Results: MR THORACIC SPINE W WO CONTRAST  Result Date: 04/14/2019 CLINICAL DATA:  Thoracic osteomyelitis. EXAM: MRI THORACIC WITHOUT AND WITH CONTRAST TECHNIQUE: Multiplanar and multiecho pulse sequences of the thoracic spine were obtained without and with intravenous contrast. CONTRAST:  8.78m GADAVIST GADOBUTROL 1 MMOL/ML IV SOLN COMPARISON:  02/20/2019 thoracic spine MRI. 04/13/2019 chest, abdomen, and pelvis CT. FINDINGS: Alignment:  Mildly increased kyphosis centered at T9. No listhesis. Vertebrae: Persistent abnormal marrow edema and enhancement in the T9 vertebral body with progressive osseous erosion and pathologic fracture likely extending into the pedicles and progressive moderate to severe anterior vertebral body height loss compared to the prior MRI. There is now edema and enhancement in the T8-9 and T9-10 disc spaces with erosion of the T8 inferior and T10 superior endplate, new from the prior MRI. Persistent  diffusely diminished bone marrow T1 signal intensity throughout the thoracic spine. Hemangiomas in the T7 and T10 vertebral bodies. Cord:  Normal cord signal. Paraspinal and other soft tissues: There is persistent extensive anterior paravertebral soft tissue inflammation/phlegmon bilaterally. Posterior paraspinal muscle inflammation has decreased, and no residual or new paraspinal fluid collection is identified. Dorsal epidural enhancement extends from T6-7 to T11-12, improved from the previous MRI where it extended superiorly to the T3 level. Trace right and small left pleural effusions are much smaller than on the prior MRI. Disc levels: Ventral and dorsal epidural inflammation at the T8-9 disc space and T9 vertebral body levels result in increased, moderate to severe spinal stenosis with moderate cord flattening. Moderate to severe bilateral neural foraminal stenosis is again seen at T8-9 and T9-10. A small central disc protrusion at T7-8 is unchanged and does not result in stenosis. IMPRESSION: 1. Progressive T9 osteomyelitis with increased osseous erosion and vertebral body collapse/pathologic compression fracture since 02/20/2019. 2. New discitis at T8-9 and T9-10 with T8 and T10 osteomyelitis. 3. Overall decreased extent of dorsal epidural inflammation since 02/20/2019 but with increased, moderate to severe spinal stenosis at T8-9. 4. Persistent extensive anterior paravertebral soft tissue inflammation/phlegmon. Decreased posterior paraspinal inflammation without residual or new abscess. Electronically Signed   By: ASeymour BarsD.  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  Antibiotics were stopped on 20 December but his infection recurred rapidly with severe back pain and fevers.  He was admitted after being seen at [facility] regional and transferred to [facility].  Admission blood cultures are again growing MRSA.  Repeat MRI shows progressive T9 osteomyelitis and a compression fracture this present as well as new discitis at T8 and T9 T9-T10 and T8 and T10.  There is also extensive paravertebral soft tissue inflammation and phlegmon.  IR has aspirated this space and Staph aureus is growing again.  Repeat blood cultures from December 30 are no growth to date so far.  Vascular surgery are following the patient and are considering work-up for his peripheral vascular disease once the patient's bacteremia clears.  --Continue vancomycin --I have ordered MRI right foot --he DOES need TEE  He needs 6-8 week course of anti MRSA antibiotics but without insurance will need charity care  I would strongly recommend following his IV abx with a course of oral ones  I also would recommend reimaging his spine if possible in next month  Dr. Linus Salmons is available for questions this weekend and Dr. Megan Salon take over the service on Monday.   LOS: 3 days   Thomas [name] 04/16/2019, 12:47 PM

## 2019-04-16 NOTE — Progress Notes (Signed)
PROGRESS NOTE  Thomas [name] LSL:[id number] DOB: 1976-04-01 DOA: 04/13/2019 PCP: Mackie Pai, PA-C  HPI/Recap of past 24 hours:  Thomas [name] is a 44 y.o. male with medical history significant of DM2 poorly controlled.  Recent hospitalization at [facility] regional for disseminated MRSA bacteremia, osteomyelitis and discitis.  Completed 6 weeks course of IV antibiotics on April 04, 2019.  Source of bacteremia thought to be from skin source (per previous facility report, area on toe that appeared cellulitic, area in R back that was erythematous and an abrasion of the lateral R aspect of his shin, abscess formation in the R calf and pyomyositis of the L vastus intermedius).  Developed necrosis of his R 1-5 digits since his discharge.    Presented here with worsening back pain, generalized weakness, fever and recent fall.  CT chest, abdomen and pelvis notable for findings of advanced osteomyelitis involving T9 as well as discitis.   Positive blood cultures for MRSA.  Seen by infectious disease, recommendations for IV Vancomycin.  Planned TEE on Monday.  Vascular surgery following for right 1st-5th toe dry gangrene.  04/16/19: Seen and examined.  Reports improvement of symptomatology on IV antibiotics.  Back pain is improved on pain management, chills currently resolved.  Denies any cardiopulmonary symptoms.   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  Continue to follow cultures. MRI  independently reviewed showed progressive T9 osteomyelitis with increased cultures evaluation and vertebral body collapse.  New discitis at T8-9 and T9-T10 with T8 and T10 osteomyelitis.  Moderate to severe spinal stenosis at T8-T9. Persistent extensive anterior paravertebral soft tissue inflammation/phlegmon. Decreased posterior paraspinal inflammation without residual or new abscess. Cardiology has been consulted for TEE to assess for endocarditis; tentative date Monday 04/19/19. Pain management in place C/w Bowel regimen  MRSA bacteremia Management as stated above Currently afebrile with no leukocytosis. Continue to monitor WBC and fever curve  T9 abscess status post aspiration by interventional radiology on 04/15/2019 Deep tissue culture growing staph aureus, continue to monitor cultures Continue pain management and IV antibiotics  Recurrent thoracic vertebral osteomyelitis Previously treated with 6 weeks of IV antibiotics with IV daptomycin on 04/04/2019. 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 Carlis Abbott. Continue to follow blood cultures to ensure clearance of bacteremia prior to procedure in case a stent is needed  History of DVT On Eliquis which is on hold due to possible procedure, held since 04/14/2019.  Possible rectal mass Monitor May require GI evaluation when more stable and bacteremia has cleared  Type 2 diabetes with hyperglycemia Hemoglobin A1c 8.3 on 04/14/2019. Continue insulin coverage  Essential hypertension Continue to hold off antihypertensives for now due to soft blood pressure  Hyperlipidemia Continue Robaxin  Diabetic polyneuropathy Continue gabapentin  DVT prophylaxis:  SCDs. Code Status: Full Family Communication:  None at bedside. Disposition Plan:  Home when hemodynamically stable, vascular  surgery and infectious disease have signed  off.   Consults called:  Infectious disease, vascular surgery, cardiology for TEE.  Objective: Vitals:   04/16/19 0018 04/16/19 0300 04/16/19 0500 04/16/19 0741  BP: 128/88  (!) 122/91 133/74  Pulse: (!) 106 99 93 98  Resp: (!) 28 (!) 21 16   Temp:   97.8 F (36.6 C) 98.7 F (37.1 C)  TempSrc:   Oral Oral  SpO2: 99% 97% 98% 99%  Weight:   87.4 kg   Height:        Intake/Output Summary (Last 24 hours) at 04/16/2019 1005 Last data filed at 04/16/2019 0500 Gross per 24 hour  Intake 240 ml  Output 960 ml  Net -720 ml   Filed Weights   04/14/19 0309 04/15/19 0426 04/16/19 0500  Weight: 88.8 kg 88.6 kg 87.4 kg    Exam:  . General: 44 y.o. year-old male pleasant well-developed well-nourished in no acute distress.  Alert and oriented x3.   . Cardiovascular: Regular rate and rhythm no rubs or gallops.   Marland Kitchen Respiratory: Clear to auscultation no wheezes or rales. . Abdomen: Soft nontender normal bowel sounds present.   . Musculoskeletal: Trace lower extremity edema.  Dry gangrene affecting first through fifth right toes. Marland Kitchen Psychiatry: Mood is appropriate for condition and setting.  Data Reviewed: CBC: Recent Labs  Lab 04/13/19 1247 04/14/19 0424 04/16/19 0630  WBC 15.2* 9.2 7.2  HGB 9.4* 7.9* 9.5*  HCT 30.7* 25.1* 30.3*  MCV 79.9* 78.0* 77.3*  PLT 647* 528* 333*   Basic Metabolic Panel: Recent Labs  Lab 04/13/19 1247 04/14/19 0424 04/16/19 0630  NA 133* 134* 135  K 4.1 4.0 3.7  CL 97* 101 99  CO2 '24 25 23  ' GLUCOSE 267* 200* 124*  BUN 26* 17 7  CREATININE 1.37* 0.84 1.01  CALCIUM 9.5 9.3 9.2   GFR: Estimated Creatinine Clearance: 98.4 mL/min (by C-G formula based on SCr of 1.01 mg/dL). Liver Function Tests: Recent Labs  Lab 04/13/19 1247  AST 14*  ALT 25  ALKPHOS 84  BILITOT 0.4  PROT 8.8*  ALBUMIN 3.0*   Recent Labs  Lab 04/13/19 1247  LIPASE 20   No results for input(s): AMMONIA in the last 168  hours. Coagulation Profile: Recent Labs  Lab 04/13/19 1247  INR 1.3*   Cardiac Enzymes: No results for input(s): CKTOTAL, CKMB, CKMBINDEX, TROPONINI in the last 168 hours. BNP (last 3 results) No results for input(s): PROBNP in the last 8760 hours. HbA1C: Recent Labs    04/14/19 0424  HGBA1C 8.3*   CBG: Recent Labs  Lab 04/15/19 1622 04/15/19 2019 04/16/19 0016 04/16/19 0622 04/16/19 0737  GLUCAP 184* 232* 164* 118* 125*   Lipid Profile: No results for input(s): CHOL, HDL, LDLCALC, TRIG, CHOLHDL, LDLDIRECT in the last 72 hours. Thyroid Function Tests: No results for input(s): TSH, T4TOTAL, FREET4, T3FREE, THYROIDAB in the last 72 hours. Anemia Panel: No results for input(s): VITAMINB12, FOLATE, FERRITIN, TIBC, IRON, RETICCTPCT in the last 72 hours. Urine analysis:    Component Value Date/Time   COLORURINE YELLOW 04/13/2019 1945   APPEARANCEUR CLEAR 04/13/2019 1945   LABSPEC 1.015 04/13/2019 1945   PHURINE 5.5 04/13/2019 1945   GLUCOSEU NEGATIVE 04/13/2019 1945   HGBUR NEGATIVE 04/13/2019 1945   [place] NEGATIVE 04/13/2019 [place] NEGATIVE 04/13/2019 1945   PROTEINUR 30 (A) 04/13/2019 1945   NITRITE NEGATIVE 04/13/2019 1945   LEUKOCYTESUR NEGATIVE 04/13/2019 1945   Sepsis Labs: '@LABRCNTIP' (procalcitonin:4,lacticidven:4)  ) Recent Results (from the past 240 hour(s))  Blood culture (routine x 2)     Status: Abnormal   Collection Time: 04/13/19 12:45 PM   Specimen: BLOOD  Result Value Ref Range Status   Specimen Description   Final    BLOOD RIGHT ANTECUBITAL Performed at [facility], [address] [place], [place] [address]    Special Requests   Final    BOTTLES DRAWN AEROBIC AND ANAEROBIC Blood Culture adequate volume Performed at [facility], [address] [place], [place] [address]    Culture  Setup Time   Final    GRAM POSITIVE COCCI IN CLUSTERS AEROBIC BOTTLE ONLY CRITICAL RESULT CALLED TO, READ BACK BY AND  VERIFIED WITH: M. [place], AT [phone] 04/15/19 BY D. VANHOOK    Culture (A)  Final    STAPHYLOCOCCUS AUREUS SUSCEPTIBILITIES PERFORMED ON PREVIOUS CULTURE WITHIN THE LAST 5 DAYS. Performed at [facility], [place] [address] [place], [place] [address]    Report Status 04/16/2019 FINAL  Final  Blood Culture ID Panel (Reflexed)     Status: Abnormal   Collection Time: 04/13/19 12:45 PM  Result Value Ref Range Status   Enterococcus species NOT DETECTED NOT DETECTED Final   Listeria monocytogenes NOT DETECTED NOT DETECTED Final   Staphylococcus species DETECTED (A) NOT DETECTED Final    Comment: CRITICAL RESULT CALLED TO, READ BACK BY AND VERIFIED WITH: M. [place], AT [phone] 04/15/19 BY D. VANHOOK    Staphylococcus aureus (BCID) DETECTED (A) NOT DETECTED Final    Comment: Methicillin (oxacillin)-resistant Staphylococcus aureus (MRSA). MRSA is predictably resistant to beta-lactam antibiotics (except ceftaroline). Preferred therapy is vancomycin unless clinically contraindicated. Patient requires contact precautions if  hospitalized. CRITICAL RESULT CALLED TO, READ BACK BY AND VERIFIED WITH: [place], AT [phone] 04/15/19 BY D. VANHOOK    Methicillin resistance DETECTED (A) NOT DETECTED Final    Comment: CRITICAL RESULT CALLED TO, READ BACK BY AND VERIFIED WITH: Hughie Closs PHARMD, AT [phone] 04/15/19 BY D. VANHOOK    Streptococcus species NOT DETECTED NOT DETECTED Final   Streptococcus agalactiae NOT DETECTED NOT DETECTED Final   Streptococcus pneumoniae NOT DETECTED NOT DETECTED Final   Streptococcus pyogenes NOT DETECTED NOT DETECTED Final   Acinetobacter baumannii NOT DETECTED NOT DETECTED Final   Enterobacteriaceae species NOT DETECTED NOT DETECTED Final   Enterobacter cloacae complex NOT DETECTED NOT DETECTED Final   Escherichia coli NOT DETECTED NOT DETECTED Final   Klebsiella oxytoca NOT DETECTED NOT DETECTED Final   Klebsiella pneumoniae NOT DETECTED NOT DETECTED Final    Proteus species NOT DETECTED NOT DETECTED Final   Serratia marcescens NOT DETECTED NOT DETECTED Final   Haemophilus influenzae NOT DETECTED NOT DETECTED Final   Neisseria meningitidis NOT DETECTED NOT DETECTED Final   Pseudomonas aeruginosa NOT DETECTED NOT DETECTED Final   Candida albicans NOT DETECTED NOT DETECTED Final   Candida glabrata NOT DETECTED NOT DETECTED Final   Candida krusei NOT DETECTED NOT DETECTED Final   Candida parapsilosis NOT DETECTED NOT DETECTED Final   Candida tropicalis NOT DETECTED NOT DETECTED Final    Comment: Performed at [facility], [place]. [address], [place], [place] [address]  SARS Coronavirus 2 Ag (30 min TAT) - Nasal Swab (BD Veritor Kit)     Status: None   Collection Time: 04/13/19 12:52 PM   Specimen: Nasal Swab (BD Veritor Kit)  Result Value Ref Range Status   SARS Coronavirus 2 Ag NEGATIVE NEGATIVE Final    Comment: (NOTE) SARS-CoV-2  antigen NOT DETECTED.  Negative results are presumptive.  Negative results do not preclude SARS-CoV-2 infection and should not be used as the sole basis for treatment or other patient management decisions, including infection  control decisions, particularly in the presence of clinical signs and  symptoms consistent with COVID-19, or in those who have been in contact with the virus.  Negative results must be combined with clinical observations, patient history, and epidemiological information. The expected result is Negative. Fact Sheet for Patients: PodPark.tn Fact Sheet for Healthcare Providers: GiftContent.is This test is not yet approved or cleared by the [place] FDA and  has been authorized for detection and/or diagnosis of SARS-CoV-2 by FDA under an Emergency Use Authorization (EUA).  This EUA will remain in effect (meaning this test can be used) for the duration of  the COVID-19 de claration under Section 564(b)(1) of the Act, 21 U.S.C.  section 360bbb-3(b)(1), unless the authorization is terminated or revoked sooner. Performed at [facility], [address] [place], [place] [address]   Blood culture (routine x 2)     Status: Abnormal   Collection Time: 04/13/19 12:56 PM   Specimen: BLOOD  Result Value Ref Range Status   Specimen Description   Final    BLOOD LEFT ANTECUBITAL Performed at [facility], [address] [place], [place] [address]    Special Requests   Final    BOTTLES DRAWN AEROBIC AND ANAEROBIC Blood Culture adequate volume Performed at [facility], [address] [place], [place] [address]    Culture  Setup Time   Final    GRAM POSITIVE COCCI IN BOTH AEROBIC AND ANAEROBIC BOTTLES CRITICAL RESULT CALLED TO, READ BACK BY AND VERIFIED WITH: PHARND J Iran [id number] AT 950 AM BY CM Performed at [facility], [place] [address], [place], [place] [address]    Culture METHICILLIN RESISTANT STAPHYLOCOCCUS AUREUS (A)  Final   Report Status 04/16/2019 FINAL  Final   Organism ID, Bacteria METHICILLIN RESISTANT STAPHYLOCOCCUS AUREUS  Final      Susceptibility   Methicillin resistant staphylococcus aureus - MIC*    CIPROFLOXACIN >=8 RESISTANT Resistant     ERYTHROMYCIN >=8 RESISTANT Resistant     GENTAMICIN <=0.5 SENSITIVE Sensitive     OXACILLIN >=4 RESISTANT Resistant     TETRACYCLINE <=1 SENSITIVE Sensitive     VANCOMYCIN <=0.5 SENSITIVE Sensitive     TRIMETH/SULFA <=10 SENSITIVE Sensitive     CLINDAMYCIN >=8 RESISTANT Resistant     RIFAMPIN <=0.5 SENSITIVE Sensitive     Inducible Clindamycin NEGATIVE Sensitive     * METHICILLIN RESISTANT STAPHYLOCOCCUS AUREUS  SARS CORONAVIRUS 2 (TAT 6-24 HRS) Nasopharyngeal Nasopharyngeal Swab     Status: None   Collection Time: 04/13/19  3:31 PM   Specimen: Nasopharyngeal Swab  Result Value Ref Range Status   SARS Coronavirus 2 NEGATIVE NEGATIVE Final    Comment: (NOTE) SARS-CoV-2 target nucleic acids are NOT DETECTED. The  SARS-CoV-2 RNA is generally detectable in upper and lower respiratory specimens during the acute phase of infection. Negative results do not preclude SARS-CoV-2 infection, do not rule out co-infections with other pathogens, and should not be used as the sole basis for treatment or other patient management decisions. Negative results must be combined with clinical observations, patient history, and epidemiological information. The expected result is Negative. Fact Sheet for Patients: SugarRoll.be Fact Sheet for Healthcare Providers: https://www.woods-mathews.com/ This test is not yet approved or cleared by the [place]  States FDA and  has been authorized for detection and/or diagnosis of SARS-CoV-2 by FDA under an Emergency Use Authorization (EUA). This EUA will remain  in effect (meaning this test can be used) for the duration of the COVID-19 declaration under Section 56 4(b)(1) of the Act, 21 U.S.C. section 360bbb-3(b)(1), unless the authorization is terminated or revoked sooner. Performed at [facility], [place] [address] [place], [place] [address]   Urine culture     Status: None   Collection Time: 04/13/19  7:45 PM   Specimen: Urine, Random  Result Value Ref Range Status   Specimen Description   Final    URINE, RANDOM Performed at [facility], [address] [place], [place] [address]    Special Requests   Final    NONE Performed at [facility], [address] [place], [place] [address]    Culture   Final    NO GROWTH Performed at [facility], [place] [address], [place], [place] [address]    Report Status 04/14/2019 FINAL  Final  Surgical pcr screen     Status: Abnormal   Collection Time: 04/14/19  4:42 AM   Specimen: Nasal Mucosa; Nasal Swab  Result Value Ref Range Status   MRSA, PCR POSITIVE (A) NEGATIVE Final    Comment: RESULT CALLED TO, READ BACK BY AND VERIFIED WITH: C JOHNSON ON 123020 AT  1143 BY NFIELDS    Staphylococcus aureus POSITIVE (A) NEGATIVE Final    Comment: (NOTE) The Xpert SA Assay (FDA approved for NASAL specimens in patients 75 years of age and older), is one component of a comprehensive surveillance program. It is not intended to diagnose infection nor to guide or monitor treatment. Performed at [facility], [place] [address], [place], [place] [address]   Culture, blood (routine x 2)     Status: None (Preliminary result)   Collection Time: 04/14/19 12:31 PM   Specimen: BLOOD RIGHT HAND  Result Value Ref Range Status   Specimen Description BLOOD RIGHT HAND  Final   Special Requests   Final    BOTTLES DRAWN AEROBIC AND ANAEROBIC Blood Culture adequate volume   Culture   Final    NO GROWTH 1 DAY Performed at [facility], [place] [address], [place], [place] [address]    Report Status PENDING  Incomplete  Culture, blood (routine x 2)     Status: None (Preliminary result)   Collection Time: 04/14/19 12:46 PM   Specimen: BLOOD LEFT HAND  Result Value Ref Range Status   Specimen Description BLOOD LEFT HAND  Final   Special Requests   Final    BOTTLES DRAWN AEROBIC ONLY Blood Culture results may not be optimal due to an inadequate volume of blood received in culture bottles   Culture   Final    NO GROWTH 1 DAY Performed at [facility], [place] [address], [place], [place] [address]    Report Status PENDING  Incomplete      Studies: No results found.  Scheduled Meds: . Chlorhexidine Gluconate Cloth  6 each Topical Q0600  . insulin aspart  0-9 Units Subcutaneous Q4H  . insulin glargine  15 Units Subcutaneous QHS  . mupirocin ointment  1 application Nasal BID  . pneumococcal 23 valent vaccine  0.5 mL Intramuscular Tomorrow-1000  . senna-docusate  2 tablet Oral BID  . sodium chloride flush  3 mL Intravenous Once    Continuous Infusions: .  sodium chloride 250 mL (04/13/19 2351)  . sodium chloride Stopped (04/13/19 2230)  . lactated  ringers 100 mL/hr at 04/15/19 2141  . vancomycin 1,500 mg (04/16/19 0640)     LOS: 3 days     Kayleen Memos, MD [facility] Pager [phone]  If 7PM-7AM, please contact night-coverage [email or web address] Password [email or web address] 04/16/2019, 10:05 AM

## 2019-04-16 NOTE — Evaluation (Signed)
Physical Therapy Evaluation Patient Details Name: Thomas [name] MRN: [id number] DOB: 23-Jan-1976 Today's Date: 04/16/2019   History of Present Illness  Patient is a 44 y/o male who presents with back pain, dizziness and fall at home. Recent long hospital stay at [facility] regional for disseminated MRSA bacteremia, osteomyelitis and discitis. Chest CT, abdomen and pelvis notable for findings of advanced osteomyelitis involving T9 as well as discitis T8-9, T9-10. s/p successful fluoro guided T9 vertebral body aspiration and biopsy 12/31. Positive blood cultures for MRSA. Also admitted for necrosis of right toes 1-5. Plan for TEE next week.  Clinical Impression  Patient presents with generalized weakness, pain, decreased activity tolerance and impaired mobility s/p above. Pt lives alone but has friends that assist him as needed with IADLs/food. Uses RW for mobility since last admission to [facility] in November 2020. Today, pt requires Min guard assist for bed mobility, transfers and gait training. HR up to 140 bpm. Education re: proper body mechanics and back precautions. Will follow acutely to maximize independence and mobility prior to return home.    Follow Up Recommendations Home health PT;Supervision - Intermittent    Equipment Recommendations  None recommended by PT    Recommendations for Other Services       Precautions / Restrictions Precautions Precautions: Fall Precaution Comments: watch HR Restrictions Weight Bearing Restrictions: No      Mobility  Bed Mobility Overal bed mobility: Needs Assistance Bed Mobility: Rolling;Sidelying to Sit Rolling: Supervision Sidelying to sit: HOB elevated;Supervision       General bed mobility comments: Cues for log roll technique, use of rail, slow.  Transfers Overall transfer level: Needs assistance Equipment used: Rolling walker (2 wheeled) Transfers: Sit to/from Stand Sit to Stand: Min guard         General transfer  comment: Min guard for safety. Stood from [employer], transferred to chair post ambulation.  Ambulation/Gait Ambulation/Gait assistance: Min guard Gait Distance (Feet): 120 Feet Assistive device: Rolling walker (2 wheeled) Gait Pattern/deviations: Step-through pattern;Decreased stride length Gait velocity: decreased Gait velocity interpretation: 1.31 - 2.62 ft/sec, indicative of limited community ambulator General Gait Details: Slow, mostly steady gait with RW for support. HR up to 140 bpm. Back pain came on half way through walk.  Stairs            Wheelchair Mobility    Modified Rankin (Stroke Patients Only)       Balance Overall balance assessment: Needs assistance;History of Falls Sitting-balance support: Feet supported;No upper extremity supported Sitting balance-Leahy Scale: Good     Standing balance support: During functional activity Standing balance-Leahy Scale: Fair Standing balance comment: Able to perform ADL tasks at sink with close min guard.                             Pertinent Vitals/Pain Pain Assessment: Faces Faces Pain Scale: Hurts little more Pain Location: BLE Pain Descriptors / Indicators: Constant;Discomfort Pain Intervention(s): Repositioned;Monitored during session    Home Living Family/patient expects to be discharged to:: Private residence Living Arrangements: Alone Available Help at Discharge: Friend(s);Available PRN/intermittently Type of Home: House Home Access: Stairs to enter Entrance Stairs-Rails: Can reach both Entrance Stairs-Number of Steps: 5 Home Layout: One level Home Equipment: Walker - 2 wheels;Shower seat      Prior Function Level of Independence: Needs assistance   Gait / Transfers Assistance Needed: Uses RW for mobility since d/c from hospital late November. Fell in bathroom due to  dizziness.  ADL's / Homemaking Assistance Needed: Friend assist with driving, LB bathing, and brings food  Comments: Recent  fall in shower. Last hospital stay 10/17-11/24     Hand Dominance   Dominant Hand: Right    Extremity/Trunk Assessment   Upper Extremity Assessment Upper Extremity Assessment: Defer to OT evaluation    Lower Extremity Assessment Lower Extremity Assessment: Generalized weakness(but functional)    Cervical / Trunk Assessment Cervical / Trunk Assessment: Other exceptions  Communication   Communication: Prefers language other than English  Cognition Arousal/Alertness: Awake/alert Behavior During Therapy: WFL for tasks assessed/performed Overall Cognitive Status: Within Functional Limits for tasks assessed                                        General Comments General comments (skin integrity, edema, etc.): HR up to 140 bpm during activity.    Exercises     Assessment/Plan    PT Assessment Patient needs continued PT services  PT Problem List Decreased strength;Decreased mobility;Pain;Decreased balance;Decreased activity tolerance;Cardiopulmonary status limiting activity;Decreased knowledge of precautions       PT Treatment Interventions Therapeutic activities;Gait training;Therapeutic exercise;Patient/family education;Balance training;Stair training;Functional mobility training    PT Goals (Current goals can be found in the Care Plan section)  Acute Rehab PT Goals Patient Stated Goal: to get better and go home PT Goal Formulation: With patient Time For Goal Achievement: 04/30/19 Potential to Achieve Goals: Good    Frequency Min 3X/week   Barriers to discharge Decreased caregiver support      Co-evaluation PT/OT/SLP Co-Evaluation/Treatment: Yes Reason for Co-Treatment: For patient/therapist safety PT goals addressed during session: Mobility/safety with mobility         AM-PAC PT "6 Clicks" Mobility  Outcome Measure Help needed turning from your back to your side while in a flat bed without using bedrails?: A Little Help needed moving from  lying on your back to sitting on the side of a flat bed without using bedrails?: A Little Help needed moving to and from a bed to a chair (including a wheelchair)?: A Little Help needed standing up from a chair using your arms (e.g., wheelchair or bedside chair)?: A Little Help needed to walk in hospital room?: A Little Help needed climbing 3-5 steps with a railing? : A Little 6 Click Score: 18    End of Session Equipment Utilized During Treatment: Gait belt Activity Tolerance: Patient tolerated treatment well Patient left: in chair;with call bell/phone within reach;with chair alarm set Nurse Communication: Mobility status PT Visit Diagnosis: Pain;Muscle weakness (generalized) (M62.81);Unsteadiness on feet (R26.81) Pain - Right/Left: (bil) Pain - part of body: Leg(back)    Time: 0940-1009 PT Time Calculation (min) (ACUTE ONLY): 29 min   Charges:   PT Evaluation $PT Eval Moderate Complexity: 1 Mod          Vale Haven, PT, DPT Acute Rehabilitation Services Pager [phone] Office [phone]      [name] 04/16/2019, 12:57 PM

## 2019-04-17 DIAGNOSIS — M4620 Osteomyelitis of vertebra, site unspecified: Secondary | ICD-10-CM

## 2019-04-17 LAB — GLUCOSE, CAPILLARY
Glucose-Capillary: 129 mg/dL — ABNORMAL HIGH (ref 70–99)
Glucose-Capillary: 136 mg/dL — ABNORMAL HIGH (ref 70–99)
Glucose-Capillary: 143 mg/dL — ABNORMAL HIGH (ref 70–99)
Glucose-Capillary: 145 mg/dL — ABNORMAL HIGH (ref 70–99)
Glucose-Capillary: 156 mg/dL — ABNORMAL HIGH (ref 70–99)
Glucose-Capillary: 163 mg/dL — ABNORMAL HIGH (ref 70–99)

## 2019-04-17 LAB — VANCOMYCIN, PEAK: Vancomycin Pk: 42 ug/mL — ABNORMAL HIGH (ref 30–40)

## 2019-04-17 LAB — BASIC METABOLIC PANEL
Anion gap: 12 (ref 5–15)
BUN: 9 mg/dL (ref 6–20)
CO2: 25 mmol/L (ref 22–32)
Calcium: 8.9 mg/dL (ref 8.9–10.3)
Chloride: 97 mmol/L — ABNORMAL LOW (ref 98–111)
Creatinine, Ser: 0.86 mg/dL (ref 0.61–1.24)
GFR calc Af Amer: >60 mL/min (ref >60)
GFR calc non Af Amer: >60 mL/min (ref >60)
Glucose, Bld: 169 mg/dL — ABNORMAL HIGH (ref 70–99)
Potassium: 4 mmol/L (ref 3.5–5.1)
Sodium: 134 mmol/L — ABNORMAL LOW (ref 135–145)

## 2019-04-17 LAB — CBC
HCT: 26.1 % — ABNORMAL LOW (ref 39.0–52.0)
Hemoglobin: 8.1 g/dL — ABNORMAL LOW (ref 13.0–17.0)
MCH: 24 pg — ABNORMAL LOW (ref 26.0–34.0)
MCHC: 31 g/dL (ref 30.0–36.0)
MCV: 77.4 fL — ABNORMAL LOW (ref 80.0–100.0)
Platelets: 477 10*3/uL — ABNORMAL HIGH (ref 150–400)
RBC: 3.37 MIL/uL — ABNORMAL LOW (ref 4.22–5.81)
RDW: 14.4 % (ref 11.5–15.5)
WBC: 7.4 10*3/uL (ref 4.0–10.5)
nRBC: 0 % (ref 0.0–0.2)

## 2019-04-17 MED ORDER — INSULIN ASPART 100 UNIT/ML ~~LOC~~ SOLN
0.0000 [IU] | Freq: Three times a day (TID) | SUBCUTANEOUS | Status: DC
  Administered 2019-04-18: 1 [IU] via SUBCUTANEOUS
  Administered 2019-04-19: 2 [IU] via SUBCUTANEOUS
  Administered 2019-04-20 (×2): 1 [IU] via SUBCUTANEOUS
  Administered 2019-04-20: 3 [IU] via SUBCUTANEOUS
  Administered 2019-04-21 – 2019-04-22 (×2): 1 [IU] via SUBCUTANEOUS
  Administered 2019-04-23: 15:00:00 2 [IU] via SUBCUTANEOUS
  Administered 2019-04-23 – 2019-04-24 (×3): 1 [IU] via SUBCUTANEOUS
  Administered 2019-04-24 – 2019-04-25 (×2): 2 [IU] via SUBCUTANEOUS
  Administered 2019-04-25: 1 [IU] via SUBCUTANEOUS
  Administered 2019-04-26 (×2): 3 [IU] via SUBCUTANEOUS
  Administered 2019-04-26 – 2019-04-27 (×2): 2 [IU] via SUBCUTANEOUS
  Administered 2019-04-27 – 2019-04-28 (×3): 1 [IU] via SUBCUTANEOUS
  Administered 2019-04-29: 2 [IU] via SUBCUTANEOUS
  Administered 2019-04-30 – 2019-05-01 (×2): 1 [IU] via SUBCUTANEOUS
  Administered 2019-05-01: 2 [IU] via SUBCUTANEOUS
  Administered 2019-05-02: 13:00:00 1 [IU] via SUBCUTANEOUS
  Administered 2019-05-02 – 2019-05-03 (×4): 2 [IU] via SUBCUTANEOUS

## 2019-04-17 NOTE — Progress Notes (Addendum)
Pharmacy Antibiotic Note  Thomas [name] is a 44 y.o. male admitted on 04/13/2019 with sepsis.  Pharmacy has been consulted for vancomycin and cefepime dosing.  WBC 15.2>9.2>>7.4, afebrile. LA 1.1. Scr has improved from 1.37 to 0.86 (CrCl >100 mL/min). CRP is elevated at 19.4. Lactate is 1.1. ID is following for recurrent T spine diskitis, vertebral OM and bacteremia - also r/o recurrent infectious endocarditis of MV.   Plan: Continue vancomycin to 1500 mg IV every 12 hours  Goal AUC 400-550. Expected AUC: 483.1 SCr used: 0.86 Obtain vancomycin peak and trough values with next dose. Monitor renal fx, cx results, clinical pic, and for vanc levels as appropriate  Height: 5\' 10"  (177.8 cm) Weight: 189 lb 11.2 oz (86 kg) IBW/kg (Calculated) : 73  Temp (24hrs), Avg:98.7 F (37.1 C), Min:97.6 F (36.4 C), Max:99.8 F (37.7 C)  Recent Labs  Lab 04/13/19 1247 04/13/19 1531 04/14/19 0424 04/16/19 0630 04/17/19 0335  WBC 15.2*  --  9.2 7.2 7.4  CREATININE 1.37*  --  0.84 1.01 0.86  LATICACIDVEN 2.0* 1.1  --   --   --     Estimated Creatinine Clearance: 115.5 mL/min (by C-G formula based on SCr of 0.86 mg/dL).    No Known Allergies  Antimicrobials this admission: Vancomycin 12/29>> Cefepime 12/29>>12/30 Metronidazole 12/29 x1  Dose adjustments this admission: N/A  Microbiology results: 12/29 COVID: Negative 12/29 Bcx>>MRSA 1/2 12/29 Bcx>>  MRSA 2/2 12/29 Ucx: Neg 12/30 MRSA PCR: Positive 12/30 Bcx: NGTD x 3 12/31 Would culture (bone biopsy): Rare gram + Cocci 12/31 Disc aspiration: NGTD   Thank you for allowing pharmacy to be a part of this patient's care.  1/32, PharmD PGY1 Acute Care Pharmacy Resident  Please check AMION for all [facility] Pharmacy phone numbers After 10:00 PM, call Main Pharmacy [phone] 04/17/2019 9:51 AM

## 2019-04-17 NOTE — Progress Notes (Signed)
PROGRESS NOTE  Thomas [name] DVV:[id number] DOB: February 12, 1976 DOA: 04/13/2019 PCP: [name], PA-C  Brief History   Thomas [name] a 44 y.o.malewith medical history significant ofDM2 poorly controlled.  Recent hospitalization at [facility] for disseminated MRSA bacteremia, osteomyelitis and discitis.  Completed 6 weeks course of IV antibiotics on April 04, 2019.  Source of bacteremia thought to be from skin source (per previous facility report, area on toe that appeared cellulitic, area in R back that was erythematous and an abrasion of the lateral R aspect of his shin, abscess formation in the R calf and pyomyositis of the L vastus intermedius).  Developed necrosis of his R 1-5 digits since his discharge.    Presented here with worsening back pain, generalized weakness, fever and recent fall.  CT chest, abdomen and pelvis notable for findings of advanced osteomyelitis involving T9 as well as discitis.  Positive blood cultures for MRSA.  Seen by infectious disease, recommendations for IV Vancomycin.  Planned TEE on Monday.  Vascular surgery following for right 1st-5th toe dry gangrene. However, he wants to wait until current infection in controlled before proceeding with aortogram etc to address PAD and dry gangrene.  Consultants  . Vascular surgery . Interventional radiology . Infectious disease . Wound Care  Procedures  . Aspiration of T9 to guide further therapy  Antibiotics   Anti-infectives (From admission, onward)   Start     Dose/Rate Route Frequency Ordered Stop   04/14/19 1600  vancomycin (VANCOREADY) IVPB 1500 mg/300 mL     1,500 mg 150 mL/hr over 120 Minutes Intravenous Every 12 hours 04/14/19 1240     04/13/19 2200  ceFEPIme (MAXIPIME) 2 g in sodium chloride 0.9 % 100 mL IVPB  Status:  Discontinued     2 g 200 mL/hr over 30 Minutes Intravenous Every 8 hours 04/13/19 1428 04/14/19 1028   04/13/19 1430  vancomycin (VANCOCIN) IVPB 1000 mg/200 mL premix   Status:  Discontinued     1,000 mg 200 mL/hr over 60 Minutes Intravenous Every 12 hours 04/13/19 1428 04/14/19 1240   04/13/19 1415  ceFEPIme (MAXIPIME) 2 g in sodium chloride 0.9 % 100 mL IVPB     2 g 200 mL/hr over 30 Minutes Intravenous  Once 04/13/19 1411 04/13/19 1505   04/13/19 1415  metroNIDAZOLE (FLAGYL) IVPB 500 mg     500 mg 100 mL/hr over 60 Minutes Intravenous  Once 04/13/19 1411 04/13/19 1600   04/13/19 1415  vancomycin (VANCOCIN) IVPB 1000 mg/200 mL premix  Status:  Discontinued     1,000 mg 200 mL/hr over 60 Minutes Intravenous  Once 04/13/19 1411 04/13/19 1428    .   Subjective  The patient was seen with the assistance of video interpretor.  Objective   Vitals:  Vitals:   04/17/19 0742 04/17/19 2013  BP: 125/84 (!) 143/93  Pulse: 94 (!) 105  Resp:  19  Temp: 97.8 F (36.6 C) 98.6 F (37 C)  SpO2: 100% 100%   Exam:  Constitutional:  . The patient is awake, alert, and oriented x 3. No acute distress. Respiratory:  . No increased work of breathing. . No wheezes, rales, or rhonchi . No tactile fremitus Cardiovascular:  . Regular rate and rhythm . No murmurs, ectopy, or gallups. . No lateral PMI. No thrills. Abdomen:  . Abdomen is soft, non-tender, non-distended . No hernias, masses, or organomegaly . Normoactive bowel sounds.  Musculoskeletal:  . No cyanosis or clubbing . 2-3+ pitting edema of lower extremities bilaterally .  All 5 digits on right foot are blackened with dry gangrene. Skin:  . No rashes, lesions, ulcers . palpation of skin: no induration or nodules . Blackening of distal aspect of 5 digits on right foot. Neurologic:  . CN 2-12 intact . Sensation all 4 extremities intact Psychiatric:  . Mental status o Mood, affect appropriate o Orientation to person, place, time  . judgment and insight appear intact  I have personally reviewed the following:   Today's Data  . Vitals, CBC, BMP  Micro Data  . No growth of surveillance  cultures.  Scheduled Meds: . Chlorhexidine Gluconate Cloth  6 each Topical Q0600  . [START ON 04/18/2019] insulin aspart  0-9 Units Subcutaneous TID WC  . insulin glargine  15 Units Subcutaneous QHS  . mupirocin ointment  1 application Nasal BID  . pneumococcal 23 valent vaccine  0.5 mL Intramuscular Tomorrow-1000  . senna-docusate  2 tablet Oral BID  . sodium chloride flush  3 mL Intravenous Once   Continuous Infusions: . sodium chloride 250 mL (04/13/19 2351)  . sodium chloride Stopped (04/13/19 2230)  . lactated ringers 100 mL/hr at 04/17/19 1510  . vancomycin 1,500 mg (04/17/19 1700)    Principal Problem:   Osteomyelitis of spine (HCC) Active Problems:   Diabetes mellitus type II, uncontrolled (HCC)   HTN (hypertension)   Rectal mass   DVT of popliteal vein (HCC)   Gangrene of toe of right foot (HCC)   Acute right-sided thoracic back pain   Discitis of thoracolumbar region   MRSA bacteremia   Endocarditis of mitral valve   LOS: 4 days    A & P  Recurrent thoracic spine discitis/vertebral osteomyelitis/MRSA bacteremia:  Blood culture drawn on 04/13/2019 + for MRSA times 2 out of 2 bottles Infectious disease has been consulted, recommendation for IV vancomycin for now Repeated blood cultures on 04/14/2019 - to date.  Continue to follow cultures. MRI independently reviewed showed progressive T9 osteomyelitis with increased cultures evaluation and vertebral body collapse.  New discitis at T8-9 and T9-T10 with T8 and T10 osteomyelitis.  Moderate to severe spinal stenosis at T8-T9. Persistent extensive anterior paravertebral soft tissue inflammation/phlegmon. Decreased posterior paraspinal inflammation without residual or new abscess. Cardiology has been consulted for TEE to assess for endocarditis; tentative date Monday 04/19/19. Pain management in place C/w Bowel regimen  MRSA bacteremia:  Management as stated above Currently afebrile with no leukocytosis. Continue to  monitor WBC and fever curve  T9 abscess status post aspiration by interventional radiology on 04/15/2019:  Deep tissue culture growing staph aureus, continue to monitor cultures Continue pain management and IV antibiotics  Recurrent thoracic vertebral osteomyelitis: Previously treated with 6 weeks of IV antibiotics with IV daptomycin on 04/04/2019. 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 Carlis Abbott. He wishes to wait to ensure clearance of bacteremia prior to procedure in case a stent is needed. The patient was quite upset upon hearing this. He believed that his toes were only "dry". I encouraged him to discuss proposed investigations and interventions with Dr. Carlis Abbott.   History of DVT: On Eliquis which is on hold due to possible procedure, held since 04/14/2019.  Possible rectal mass: Monitor. May require GI evaluation when more stable and bacteremia has cleared.  Type 2 diabetes with hyperglycemia: Hemoglobin A1c 8.3 on 04/14/2019. Continue insulin coverage.  Essential hypertension: Continue to hold off  antihypertensives for now due to soft blood pressure.  Hyperlipidemia: Continue Robaxin.  Diabetic polyneuropathy: Continue gabapentin  I have seen and examined this patient myself. I have spent 35 minutes in his evaluation and care.  DVT prophylaxis: SCDs. Code Status:Full Family Communication: None at bedside. Disposition Plan: Home when hemodynamically stable, vascular surgery and infectious disease have signed off.    Beaux Wedemeyer, DO [facility] Direct contact: see [email or web address]  7PM-7AM contact night coverage as above 04/17/2019, 8:29 PM  LOS: 4 days

## 2019-04-18 LAB — CBC
HCT: 26.5 % — ABNORMAL LOW (ref 39.0–52.0)
Hemoglobin: 8.1 g/dL — ABNORMAL LOW (ref 13.0–17.0)
MCH: 24.3 pg — ABNORMAL LOW (ref 26.0–34.0)
MCHC: 30.6 g/dL (ref 30.0–36.0)
MCV: 79.6 fL — ABNORMAL LOW (ref 80.0–100.0)
Platelets: 532 10*3/uL — ABNORMAL HIGH (ref 150–400)
RBC: 3.33 MIL/uL — ABNORMAL LOW (ref 4.22–5.81)
RDW: 14.5 % (ref 11.5–15.5)
WBC: 6.7 10*3/uL (ref 4.0–10.5)
nRBC: 0 % (ref 0.0–0.2)

## 2019-04-18 LAB — BASIC METABOLIC PANEL
Anion gap: 9 (ref 5–15)
BUN: 9 mg/dL (ref 6–20)
CO2: 28 mmol/L (ref 22–32)
Calcium: 9.1 mg/dL (ref 8.9–10.3)
Chloride: 101 mmol/L (ref 98–111)
Creatinine, Ser: 0.71 mg/dL (ref 0.61–1.24)
GFR calc Af Amer: >60 mL/min (ref >60)
GFR calc non Af Amer: >60 mL/min (ref >60)
Glucose, Bld: 96 mg/dL (ref 70–99)
Potassium: 4.1 mmol/L (ref 3.5–5.1)
Sodium: 138 mmol/L (ref 135–145)

## 2019-04-18 LAB — GLUCOSE, CAPILLARY
Glucose-Capillary: 81 mg/dL (ref 70–99)
Glucose-Capillary: 88 mg/dL (ref 70–99)
Glucose-Capillary: 146 mg/dL — ABNORMAL HIGH (ref 70–99)
Glucose-Capillary: 135 mg/dL — ABNORMAL HIGH (ref 70–99)

## 2019-04-18 LAB — VANCOMYCIN, TROUGH: Vancomycin Tr: 18 ug/mL (ref 15–20)

## 2019-04-18 MED ORDER — VANCOMYCIN HCL IN DEXTROSE 1-5 GM/200ML-% IV SOLN
1000.0000 mg | Freq: Two times a day (BID) | INTRAVENOUS | Status: DC
  Administered 2019-04-18 – 2019-04-22 (×8): 1000 mg via INTRAVENOUS
  Filled 2019-04-18 (×9): qty 200

## 2019-04-18 MED ORDER — SODIUM CHLORIDE 0.9 % IV SOLN: INTRAVENOUS | Status: DC

## 2019-04-18 NOTE — Anesthesia Preprocedure Evaluation (Addendum)
Anesthesia Evaluation  Patient identified by MRN, date of birth, ID band Patient awake    Reviewed: Allergy & Precautions, NPO status , Patient's Chart, lab work & pertinent test results  Airway Mallampati: II  TM Distance: >3 FB Neck ROM: Full    Dental  (+) Teeth Intact, Dental Advisory Given   Pulmonary neg pulmonary ROS, former smoker,    Pulmonary exam normal breath sounds clear to auscultation       Cardiovascular hypertension, Pt. on medications Normal cardiovascular exam Rhythm:Regular Rate:Normal  Echo 04/14/2019: 1. No evidence of valvular vegetations on this study. If there is clinical suspicion for endocarditis, would recommend TEE to rule this out.  2. Left ventricular ejection fraction, by visual estimation, is 55 to 60%. The left ventricle has normal function. There is borderline left ventricular hypertrophy.  3. The left ventricle has no regional wall motion abnormalities.  4. Global right ventricle has normal systolic function.The right ventricular size is normal. No increase in right ventricular wall thickness.  5. Left atrial size was normal.  6. Right atrial size was normal.  7. Presence of pericardial fat pad.  8. The pericardial effusion is lateral to the left ventricle.  9. Trivial pericardial effusion is present. 10. The mitral valve is grossly normal. No evidence of mitral valve regurgitation. 11. The tricuspid valve is grossly normal. 12. The aortic valve is tricuspid. Aortic valve regurgitation is not visualized. No evidence of aortic valve sclerosis or stenosis. 13. The pulmonic valve was grossly normal. Pulmonic valve regurgitation is not visualized. 14. The inferior vena cava is normal in size with greater than 50% respiratory variability, suggesting right atrial pressure of 3 mmHg. 15. No prior Echocardiogram. 16. TR signal is inadequate for assessing pulmonary artery systolic pressure.    Neuro/Psych PSYCHIATRIC DISORDERS Depression negative neurological ROS     GI/Hepatic negative GI ROS, Neg liver ROS,   Endo/Other  diabetes, Type 2, Oral Hypoglycemic Agents, Insulin Dependent  Renal/GU negative Renal ROS     Musculoskeletal  (+) Arthritis ,   Abdominal   Peds  Hematology  (+) Blood dyscrasia (Xarelto), , Bacteremia    Anesthesia Other Findings Day of surgery medications reviewed with the patient.  Reproductive/Obstetrics                            Anesthesia Physical Anesthesia Plan  ASA: III  Anesthesia Plan: MAC   Post-op Pain Management:    Induction: Intravenous  PONV Risk Score and Plan: 1 and Propofol infusion and Treatment may vary due to age or medical condition  Airway Management Planned: Natural Airway and Nasal Cannula  Additional Equipment:   Intra-op Plan:   Post-operative Plan:   Informed Consent: I have reviewed the patients History and Physical, chart, labs and discussed the procedure including the risks, benefits and alternatives for the proposed anesthesia with the patient or authorized representative who has indicated his/her understanding and acceptance.     Dental advisory given  Plan Discussed with: CRNA  Anesthesia Plan Comments:        Anesthesia Quick Evaluation

## 2019-04-18 NOTE — Progress Notes (Signed)
   Consent obtained with the assistance of [employer] ID number [phone]  [facility] has been requested to perform a transesophageal echocardiogram on 04/19/2019 for bacteremia.  After careful review of history and examination, the risks and benefits of transesophageal echocardiogram have been explained including risks of esophageal damage, perforation (1:10,000 risk), bleeding, pharyngeal hematoma as well as other potential complications associated with conscious sedation including aspiration, arrhythmia, respiratory failure and death. Alternatives to treatment were discussed, questions were answered. Patient is willing to proceed.   TEE scheduled for 04/19/2019 at 7:30am with Dr. Alessandra Grout, PA-C 04/18/2019 8:17 PM

## 2019-04-18 NOTE — Progress Notes (Signed)
PROGRESS NOTE  [name] PYP:[id number] DOB: [birth year] DOA: 04/13/2019 PCP: Mackie Pai, PA-C  Brief History   Thomas [name] a 44 y.o.malewith medical history significant ofDM2 poorly controlled.  Recent hospitalization at [facility] for disseminated MRSA bacteremia, osteomyelitis and discitis.  Completed 6 weeks course of IV antibiotics on April 04, 2019.  Source of bacteremia thought to be from skin source (per previous facility report, area on toe that appeared cellulitic, area in R back that was erythematous and an abrasion of the lateral R aspect of his shin, abscess formation in the R calf and pyomyositis of the L vastus intermedius).  Developed necrosis of his R 1-5 digits since his discharge.    Presented here with worsening back pain, generalized weakness, fever and recent fall.  CT chest, abdomen and pelvis notable for findings of advanced osteomyelitis involving T9 as well as discitis.  Positive blood cultures for MRSA.  Seen by infectious disease, recommendations for IV Vancomycin.  Planned TEE on Monday.  Vascular surgery following for right 1st-5th toe dry gangrene. However, he wants to wait until current infection in controlled before proceeding with aortogram etc to address PAD and dry gangrene.  Consultants  . Vascular surgery . Interventional radiology . Infectious disease . Wound Care  Procedures  . Aspiration of T9 to guide further therapy  Antibiotics   Anti-infectives (From admission, onward)   Start     Dose/Rate Route Frequency Ordered Stop   04/18/19 1800  vancomycin (VANCOCIN) IVPB 1000 mg/200 mL premix     1,000 mg 200 mL/hr over 60 Minutes Intravenous Every 12 hours 04/18/19 0718     04/14/19 1600  vancomycin (VANCOREADY) IVPB 1500 mg/300 mL  Status:  Discontinued     1,500 mg 150 mL/hr over 120 Minutes Intravenous Every 12 hours 04/14/19 1240 04/18/19 0718   04/13/19 2200  ceFEPIme (MAXIPIME) 2 g in sodium chloride 0.9 % 100  mL IVPB  Status:  Discontinued     2 g 200 mL/hr over 30 Minutes Intravenous Every 8 hours 04/13/19 1428 04/14/19 1028   04/13/19 1430  vancomycin (VANCOCIN) IVPB 1000 mg/200 mL premix  Status:  Discontinued     1,000 mg 200 mL/hr over 60 Minutes Intravenous Every 12 hours 04/13/19 1428 04/14/19 1240   04/13/19 1415  ceFEPIme (MAXIPIME) 2 g in sodium chloride 0.9 % 100 mL IVPB     2 g 200 mL/hr over 30 Minutes Intravenous  Once 04/13/19 1411 04/13/19 1505   04/13/19 1415  metroNIDAZOLE (FLAGYL) IVPB 500 mg     500 mg 100 mL/hr over 60 Minutes Intravenous  Once 04/13/19 1411 04/13/19 1600   04/13/19 1415  vancomycin (VANCOCIN) IVPB 1000 mg/200 mL premix  Status:  Discontinued     1,000 mg 200 mL/hr over 60 Minutes Intravenous  Once 04/13/19 1411 04/13/19 1428      Subjective  The patient was seen with the assistance of video interpretor.  Objective   Vitals:  Vitals:   04/18/19 0625 04/18/19 1400  BP: (!) 148/91 (!) 148/88  Pulse:  87  Resp: 12   Temp: 98.5 F (36.9 C) 98.2 F (36.8 C)  SpO2: 99% 99%   Exam:  Constitutional:  . The patient is awake, alert, and oriented x 3. No acute distress. Respiratory:  . No increased work of breathing. . No wheezes, rales, or rhonchi . No tactile fremitus Cardiovascular:  . Regular rate and rhythm . No murmurs, ectopy, or gallups. . No lateral PMI. No thrills.  Abdomen:  . Abdomen is soft, non-tender, non-distended . No hernias, masses, or organomegaly . Normoactive bowel sounds.  Musculoskeletal:  . No cyanosis or clubbing . 2-3+ pitting edema of lower extremities bilaterally . All 5 digits on right foot are blackened with dry gangrene. Skin:  . No rashes, lesions, ulcers . palpation of skin: no induration or nodules . Blackening of distal aspect of 5 digits on right foot. Neurologic:  . CN 2-12 intact . Sensation all 4 extremities intact Psychiatric:  . Mental status o Mood, affect appropriate o Orientation to  person, place, time  . judgment and insight appear intact  I have personally reviewed the following:   Today's Data  . Vitals, CBC, BMP  Micro Data  . No growth of surveillance cultures.  Scheduled Meds: . Chlorhexidine Gluconate Cloth  6 each Topical Q0600  . insulin aspart  0-9 Units Subcutaneous TID WC  . insulin glargine  15 Units Subcutaneous QHS  . mupirocin ointment  1 application Nasal BID  . pneumococcal 23 valent vaccine  0.5 mL Intramuscular Tomorrow-1000  . senna-docusate  2 tablet Oral BID  . sodium chloride flush  3 mL Intravenous Once   Continuous Infusions: . sodium chloride 250 mL (04/13/19 2351)  . sodium chloride Stopped (04/13/19 2230)  . lactated ringers 100 mL/hr at 04/18/19 0553  . 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 Infectious disease has been consulted, recommendation for IV vancomycin for now. Repeated blood cultures on 04/14/2019 - to date.  Continue to follow cultures. MRI independently reviewed showed progressive T9 osteomyelitis with increased cultures evaluation and vertebral body collapse.  New discitis at T8-9 and T9-T10 with T8 and T10 osteomyelitis.  Moderate to severe spinal stenosis at T8-T9. Persistent extensive anterior paravertebral soft tissue inflammation/phlegmon. Decreased posterior paraspinal inflammation without residual or new abscess. Cardiology has been consulted for TEE to assess for endocarditis; tentative date Monday 04/19/19.Pain  management in place.Continue bowel regimen.  MRSA bacteremia: Management as stated above. Currently afebrile with no leukocytosis. Continue to monitor WBC and fever curve.  T9 abscess status post aspiration by interventional radiology on 04/15/2019: Deep tissue culture growing staph aureus, continue to monitor cultures. Continue pain management and IV antibiotics.  Recurrent thoracic vertebral osteomyelitis: Previously treated with 6 weeks of IV antibiotics with IV daptomycin on 04/04/2019. Significantly elevated CRP 19.4 and sed rate >140. Management as per above.  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 Chestine Spore. He wishes to wait to ensure clearance of bacteremia prior to procedure in case a stent is needed. The patient was quite upset upon hearing this. He believed that his toes were only "dry". I encouraged him to discuss proposed investigations and interventions with Dr. Chestine Spore. He is more comfortable with this today.  History of DVT: On Eliquis which is on hold due to possible procedure, held since 04/14/2019.  Possible rectal mass: Monitor. May  require GI evaluation when more stable and bacteremia has cleared.  Type 2 diabetes with hyperglycemia: Hemoglobin A1c 8.3 on 04/14/2019. Continue insulin coverage.  Essential hypertension: Continue to hold off antihypertensives for now due to soft blood pressure.  Hyperlipidemia: Continue Robaxin.  Diabetic polyneuropathy: Continue gabapentin  I have seen and examined this patient myself. I have spent 32 minutes in his evaluation and care.  DVT prophylaxis: SCDs. Code Status:Full Family Communication: None at bedside. Disposition Plan: Home when hemodynamically stable, vascular surgery and infectious disease have signed off.    Jadavion Spoelstra, DO [facility] Direct contact: see [email or web address]  7PM-7AM contact night coverage as above 04/18/2019,  2:53 PM  LOS: 4 days         

## 2019-04-18 NOTE — Progress Notes (Signed)
   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  He is scheduled for a TEE also.  I have spoken to cardiology and his TEE is first thing in the morning.  Hopefully we will still be able to proceed with his arteriogram in the afternoon.  I have spoken to him through the translator device and explained the plan.  I have answered all of his questions regarding his arteriogram.   SUBJECTIVE:   No specific complaints.  PHYSICAL EXAM:   Vitals:   04/17/19 0417 04/17/19 0742 04/17/19 2013 04/18/19 0625  BP: 135/80 125/84 (!) 143/93 (!) 148/91  Pulse: (!) 101 94 (!) 105   Resp: 18  19 12   Temp: 97.6 F (36.4 C) 97.8 F (36.6 C) 98.6 F (37 C) 98.5 F (36.9 C)  TempSrc: Oral Oral Oral Oral  SpO2: 100% 100% 100% 99%  Weight: 86 kg     Height:       Diminished but palpable femoral pulse on the right. The dry gangrene on the toes is stable.  LABS:   Lab Results  Component Value Date   WBC 6.7 04/18/2019   HGB 8.1 (L) 04/18/2019   HCT 26.5 (L) 04/18/2019   MCV 79.6 (L) 04/18/2019   PLT 532 (H) 04/18/2019   Lab Results  Component Value Date   CREATININE 0.71 04/18/2019   Lab Results  Component Value Date   INR 1.3 (H) 04/13/2019   CBG (last 3)  Recent Labs    04/17/19 1728 04/17/19 2121 04/18/19 0742  GLUCAP 143* 163* 81    PROBLEM LIST:    Principal Problem:   Osteomyelitis of spine (HCC) Active Problems:   Diabetes mellitus type II, uncontrolled (HCC)   HTN (hypertension)   Rectal mass   DVT of popliteal vein (HCC)   Gangrene of toe of right foot (HCC)   Acute right-sided thoracic back pain   Discitis of thoracolumbar region   MRSA bacteremia   Endocarditis of mitral valve   CURRENT MEDS:   . Chlorhexidine Gluconate Cloth  6 each Topical Q0600  . insulin aspart  0-9 Units Subcutaneous TID WC  . insulin glargine  15 Units Subcutaneous QHS  .  mupirocin ointment  1 application Nasal BID  . pneumococcal 23 valent vaccine  0.5 mL Intramuscular Tomorrow-1000  . senna-docusate  2 tablet Oral BID  . sodium chloride flush  3 mL Intravenous Once    06/16/19 Office: [phone] 04/18/2019

## 2019-04-18 NOTE — Progress Notes (Signed)
Pharmacy Antibiotic Note  Thomas [name] is a 44 y.o. male admitted on 04/13/2019 with sepsis.  Pharmacy has been consulted for vancomycin dosing.  WBC 15.2>9.2>>7.4>6.7, afebrile. LA 1.1. Scr has improved and stabilized ~ 0.7-0.8 (CrCl >100 mL/min). ID is following for recurrent T spine diskitis, vertebral OM and bacteremia - also r/o recurrent infectious endocarditis of MV. Vancomycin trough was 18 and peak was 42. Both levels were drawn appropriately. Calculated AUC on regimen of vancomycin 1500 mg IV every 12 hours is supratherapeutic at 748 mcg*h/mL.   Plan: Decrease vancomycin dose to 1000 mg IV every 12 hours  Goal AUC 400-550. Expected AUC: 498.4 Monitor renal fx, cx results, clinical pic, and for vanc levels as appropriate  Height: 5\' 10"  (177.8 cm) Weight: 189 lb 11.2 oz (86 kg) IBW/kg (Calculated) : 73  Temp (24hrs), Avg:98.3 F (36.8 C), Min:97.8 F (36.6 C), Max:98.6 F (37 C)  Recent Labs  Lab 04/13/19 1247 04/13/19 1531 04/14/19 0424 04/16/19 0630 04/17/19 0335 04/17/19 2009 04/18/19 0537  WBC 15.2*  --  9.2 7.2 7.4  --  6.7  CREATININE 1.37*  --  0.84 1.01 0.86  --  0.71  LATICACIDVEN 2.0* 1.1  --   --   --   --   --   VANCOTROUGH  --   --   --   --   --   --  18  VANCOPEAK  --   --   --   --   --  42*  --     Estimated Creatinine Clearance: 124.2 mL/min (by C-G formula based on SCr of 0.71 mg/dL).    No Known Allergies  Antimicrobials this admission: Vancomycin 12/29>> Cefepime 12/29>>12/30 Metronidazole 12/29 x1  Dose adjustments this admission: 04/18/19 Vancomycin 1500 mg q12 >> vancomycin 1000 mg q12  Microbiology results: 12/29 COVID: Negative 12/29 Bcx>>MRSA 1/2 12/29 Bcx>>  MRSA 2/2 12/29 Ucx: Neg 12/30 MRSA PCR: Positive 12/30 Bcx: NGTD x 3 12/31 Would culture (bone biopsy): Rare gram + Cocci 12/31 Disc aspiration: NGTD   Thank you for allowing pharmacy to be a part of this patient's care.  1/32, PharmD PGY1 Acute Care  Pharmacy Resident  Please check AMION for all [facility] phone numbers After 10:00 PM, call Main Pharmacy [phone] 04/18/2019 7:11 AM

## 2019-04-18 NOTE — Progress Notes (Signed)
PHARMACY - PHYSICIAN COMMUNICATION CRITICAL VALUE ALERT - BLOOD CULTURE IDENTIFICATION (BCID)  Thomas [name] is an 44 y.o. male who presented to [facility] on (Not on file) with a chief complaint of worsening back pain. Found to have MRSA bacteremia in a culture on 12/29 and now is likely persistent. Has already undergone IR aspiration of epidural abscess which is also growing MRSA. Patient will need an extended course of antibiotics. ID following   Assessment:  1/3 bottles positive for GPC - micro lab reports it looks identical to previous MRSA cultures on gram stain that grew in the 12/29 [facility]  Name of physician (or Provider) Contacted: Dr. Edilia Bo  Current antibiotics: Vancomycin  Changes to prescribed antibiotics recommended:  Recommendations accepted by provider Continue on Vancomycin   Results for orders placed or performed during the hospital encounter of 04/13/19  Blood Culture ID Panel (Reflexed) (Collected: 04/13/2019 12:45 PM)  Result Value Ref Range   Enterococcus species NOT DETECTED NOT DETECTED   Listeria monocytogenes NOT DETECTED NOT DETECTED   Staphylococcus species DETECTED (A) NOT DETECTED   Staphylococcus aureus (BCID) DETECTED (A) NOT DETECTED   Methicillin resistance DETECTED (A) NOT DETECTED   Streptococcus species NOT DETECTED NOT DETECTED   Streptococcus agalactiae NOT DETECTED NOT DETECTED   Streptococcus pneumoniae NOT DETECTED NOT DETECTED   Streptococcus pyogenes NOT DETECTED NOT DETECTED   Acinetobacter baumannii NOT DETECTED NOT DETECTED   Enterobacteriaceae species NOT DETECTED NOT DETECTED   Enterobacter cloacae complex NOT DETECTED NOT DETECTED   Escherichia coli NOT DETECTED NOT DETECTED   Klebsiella oxytoca NOT DETECTED NOT DETECTED   Klebsiella pneumoniae NOT DETECTED NOT DETECTED   Proteus species NOT DETECTED NOT DETECTED   Serratia marcescens NOT DETECTED NOT DETECTED   Haemophilus influenzae NOT DETECTED NOT DETECTED   Neisseria  meningitidis NOT DETECTED NOT DETECTED   Pseudomonas aeruginosa NOT DETECTED NOT DETECTED   Candida albicans NOT DETECTED NOT DETECTED   Candida glabrata NOT DETECTED NOT DETECTED   Candida krusei NOT DETECTED NOT DETECTED   Candida parapsilosis NOT DETECTED NOT DETECTED   Candida tropicalis NOT DETECTED NOT DETECTED    Jeannetta Nap 04/18/2019  1:27 PM

## 2019-04-18 NOTE — H&P (View-Only) (Signed)
PROGRESS NOTE  Thomas [name] PYP:[id number] DOB: [birth year] DOA: 04/13/2019 PCP: Mackie Pai, PA-C  Brief History   Thomas Mohammedis a 44 y.o.malewith medical history significant ofDM2 poorly controlled.  Recent hospitalization at [facility] for disseminated MRSA bacteremia, osteomyelitis and discitis.  Completed 6 weeks course of IV antibiotics on April 04, 2019.  Source of bacteremia thought to be from skin source (per previous facility report, area on toe that appeared cellulitic, area in R back that was erythematous and an abrasion of the lateral R aspect of his shin, abscess formation in the R calf and pyomyositis of the L vastus intermedius).  Developed necrosis of his R 1-5 digits since his discharge.    Presented here with worsening back pain, generalized weakness, fever and recent fall.  CT chest, abdomen and pelvis notable for findings of advanced osteomyelitis involving T9 as well as discitis.  Positive blood cultures for MRSA.  Seen by infectious disease, recommendations for IV Vancomycin.  Planned TEE on Monday.  Vascular surgery following for right 1st-5th toe dry gangrene. However, he wants to wait until current infection in controlled before proceeding with aortogram etc to address PAD and dry gangrene.  Consultants  . Vascular surgery . Interventional radiology . Infectious disease . Wound Care  Procedures  . Aspiration of T9 to guide further therapy  Antibiotics   Anti-infectives (From admission, onward)   Start     Dose/Rate Route Frequency Ordered Stop   04/18/19 1800  vancomycin (VANCOCIN) IVPB 1000 mg/200 mL premix     1,000 mg 200 mL/hr over 60 Minutes Intravenous Every 12 hours 04/18/19 0718     04/14/19 1600  vancomycin (VANCOREADY) IVPB 1500 mg/300 mL  Status:  Discontinued     1,500 mg 150 mL/hr over 120 Minutes Intravenous Every 12 hours 04/14/19 1240 04/18/19 0718   04/13/19 2200  ceFEPIme (MAXIPIME) 2 g in sodium chloride 0.9 % 100  mL IVPB  Status:  Discontinued     2 g 200 mL/hr over 30 Minutes Intravenous Every 8 hours 04/13/19 1428 04/14/19 1028   04/13/19 1430  vancomycin (VANCOCIN) IVPB 1000 mg/200 mL premix  Status:  Discontinued     1,000 mg 200 mL/hr over 60 Minutes Intravenous Every 12 hours 04/13/19 1428 04/14/19 1240   04/13/19 1415  ceFEPIme (MAXIPIME) 2 g in sodium chloride 0.9 % 100 mL IVPB     2 g 200 mL/hr over 30 Minutes Intravenous  Once 04/13/19 1411 04/13/19 1505   04/13/19 1415  metroNIDAZOLE (FLAGYL) IVPB 500 mg     500 mg 100 mL/hr over 60 Minutes Intravenous  Once 04/13/19 1411 04/13/19 1600   04/13/19 1415  vancomycin (VANCOCIN) IVPB 1000 mg/200 mL premix  Status:  Discontinued     1,000 mg 200 mL/hr over 60 Minutes Intravenous  Once 04/13/19 1411 04/13/19 1428      Subjective  The patient was seen with the assistance of video interpretor.  Objective   Vitals:  Vitals:   04/18/19 0625 04/18/19 1400  BP: (!) 148/91 (!) 148/88  Pulse:  87  Resp: 12   Temp: 98.5 F (36.9 C) 98.2 F (36.8 C)  SpO2: 99% 99%   Exam:  Constitutional:  . The patient is awake, alert, and oriented x 3. No acute distress. Respiratory:  . No increased work of breathing. . No wheezes, rales, or rhonchi . No tactile fremitus Cardiovascular:  . Regular rate and rhythm . No murmurs, ectopy, or gallups. . No lateral PMI. No thrills.  Abdomen:  . Abdomen is soft, non-tender, non-distended . No hernias, masses, or organomegaly . Normoactive bowel sounds.  Musculoskeletal:  . No cyanosis or clubbing . 2-3+ pitting edema of lower extremities bilaterally . All 5 digits on right foot are blackened with dry gangrene. Skin:  . No rashes, lesions, ulcers . palpation of skin: no induration or nodules . Blackening of distal aspect of 5 digits on right foot. Neurologic:  . CN 2-12 intact . Sensation all 4 extremities intact Psychiatric:  . Mental status o Mood, affect appropriate o Orientation to  person, place, time  . judgment and insight appear intact  I have personally reviewed the following:   Today's Data  . Vitals, CBC, BMP  Micro Data  . No growth of surveillance cultures.  Scheduled Meds: . Chlorhexidine Gluconate Cloth  6 each Topical Q0600  . insulin aspart  0-9 Units Subcutaneous TID WC  . insulin glargine  15 Units Subcutaneous QHS  . mupirocin ointment  1 application Nasal BID  . pneumococcal 23 valent vaccine  0.5 mL Intramuscular Tomorrow-1000  . senna-docusate  2 tablet Oral BID  . sodium chloride flush  3 mL Intravenous Once   Continuous Infusions: . sodium chloride 250 mL (04/13/19 2351)  . sodium chloride Stopped (04/13/19 2230)  . lactated ringers 100 mL/hr at 04/18/19 0553  . 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 Infectious disease has been consulted, recommendation for IV vancomycin for now. Repeated blood cultures on 04/14/2019 - to date.  Continue to follow cultures. MRI independently reviewed showed progressive T9 osteomyelitis with increased cultures evaluation and vertebral body collapse.  New discitis at T8-9 and T9-T10 with T8 and T10 osteomyelitis.  Moderate to severe spinal stenosis at T8-T9. Persistent extensive anterior paravertebral soft tissue inflammation/phlegmon. Decreased posterior paraspinal inflammation without residual or new abscess. Cardiology has been consulted for TEE to assess for endocarditis; tentative date Monday 04/19/19.Pain  management in place.Continue bowel regimen.  MRSA bacteremia: Management as stated above. Currently afebrile with no leukocytosis. Continue to monitor WBC and fever curve.  T9 abscess status post aspiration by interventional radiology on 04/15/2019: Deep tissue culture growing staph aureus, continue to monitor cultures. Continue pain management and IV antibiotics.  Recurrent thoracic vertebral osteomyelitis: Previously treated with 6 weeks of IV antibiotics with IV daptomycin on 04/04/2019. Significantly elevated CRP 19.4 and sed rate >140. Management as per above.  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 Chestine Spore. He wishes to wait to ensure clearance of bacteremia prior to procedure in case a stent is needed. The patient was quite upset upon hearing this. He believed that his toes were only "dry". I encouraged him to discuss proposed investigations and interventions with Dr. Chestine Spore. He is more comfortable with this today.  History of DVT: On Eliquis which is on hold due to possible procedure, held since 04/14/2019.  Possible rectal mass: Monitor. May  require GI evaluation when more stable and bacteremia has cleared.  Type 2 diabetes with hyperglycemia: Hemoglobin A1c 8.3 on 04/14/2019. Continue insulin coverage.  Essential hypertension: Continue to hold off antihypertensives for now due to soft blood pressure.  Hyperlipidemia: Continue Robaxin.  Diabetic polyneuropathy: Continue gabapentin  I have seen and examined this patient myself. I have spent 32 minutes in his evaluation and care.  DVT prophylaxis: SCDs. Code Status:Full Family Communication: None at bedside. Disposition Plan: Home when hemodynamically stable, vascular surgery and infectious disease have signed off.    Lizann Edelman, DO [facility] Direct contact: see [email or web address]  7PM-7AM contact night coverage as above 04/18/2019,  2:53 PM  LOS: 4 days

## 2019-04-19 ENCOUNTER — Encounter (HOSPITAL_COMMUNITY): Payer: Self-pay | Admitting: Internal Medicine

## 2019-04-19 ENCOUNTER — Inpatient Hospital Stay (HOSPITAL_COMMUNITY): Payer: Medicaid Other

## 2019-04-19 ENCOUNTER — Encounter (HOSPITAL_COMMUNITY)
Admission: EM | Disposition: A | Discharge status: Home Health | Payer: Self-pay | Source: Home / Self Care | Attending: Internal Medicine

## 2019-04-19 ENCOUNTER — Ambulatory Visit (HOSPITAL_COMMUNITY)
Admission: RE | Admit: 2019-04-19 | Payer: No Typology Code available for payment source | Source: Home / Self Care | Admitting: Vascular Surgery

## 2019-04-19 ENCOUNTER — Inpatient Hospital Stay (HOSPITAL_COMMUNITY): Payer: Medicaid Other | Admitting: Anesthesiology

## 2019-04-19 DIAGNOSIS — I38 Endocarditis, valve unspecified: Secondary | ICD-10-CM

## 2019-04-19 DIAGNOSIS — I739 Peripheral vascular disease, unspecified: Secondary | ICD-10-CM

## 2019-04-19 DIAGNOSIS — I70261 Atherosclerosis of native arteries of extremities with gangrene, right leg: Secondary | ICD-10-CM

## 2019-04-19 HISTORY — PX: TEE WITHOUT CARDIOVERSION: SHX5443

## 2019-04-19 HISTORY — PX: PERIPHERAL VASCULAR INTERVENTION: CATH118257

## 2019-04-19 HISTORY — PX: ABDOMINAL AORTOGRAM W/LOWER EXTREMITY: CATH118223

## 2019-04-19 LAB — CBC
HCT: 27.7 % — ABNORMAL LOW (ref 39.0–52.0)
Hemoglobin: 8.5 g/dL — ABNORMAL LOW (ref 13.0–17.0)
MCH: 24.2 pg — ABNORMAL LOW (ref 26.0–34.0)
MCHC: 30.7 g/dL (ref 30.0–36.0)
MCV: 78.9 fL — ABNORMAL LOW (ref 80.0–100.0)
Platelets: 516 10*3/uL — ABNORMAL HIGH (ref 150–400)
RBC: 3.51 MIL/uL — ABNORMAL LOW (ref 4.22–5.81)
RDW: 14.6 % (ref 11.5–15.5)
WBC: 6.6 10*3/uL (ref 4.0–10.5)
nRBC: 0 % (ref 0.0–0.2)

## 2019-04-19 LAB — CULTURE, BLOOD (ROUTINE X 2): Culture: NO GROWTH

## 2019-04-19 LAB — GLUCOSE, CAPILLARY
Glucose-Capillary: 120 mg/dL — ABNORMAL HIGH (ref 70–99)
Glucose-Capillary: 171 mg/dL — ABNORMAL HIGH (ref 70–99)
Glucose-Capillary: 197 mg/dL — ABNORMAL HIGH (ref 70–99)

## 2019-04-19 LAB — BASIC METABOLIC PANEL
Anion gap: 11 (ref 5–15)
BUN: 7 mg/dL (ref 6–20)
CO2: 28 mmol/L (ref 22–32)
Calcium: 9.2 mg/dL (ref 8.9–10.3)
Chloride: 99 mmol/L (ref 98–111)
Creatinine, Ser: 0.73 mg/dL (ref 0.61–1.24)
GFR calc Af Amer: >60 mL/min (ref >60)
GFR calc non Af Amer: >60 mL/min (ref >60)
Glucose, Bld: 121 mg/dL — ABNORMAL HIGH (ref 70–99)
Potassium: 3.9 mmol/L (ref 3.5–5.1)
Sodium: 138 mmol/L (ref 135–145)

## 2019-04-19 SURGERY — ABDOMINAL AORTOGRAM W/LOWER EXTREMITY
Anesthesia: LOCAL | Laterality: Right

## 2019-04-19 SURGERY — ECHOCARDIOGRAM, TRANSESOPHAGEAL
Anesthesia: Monitor Anesthesia Care

## 2019-04-19 MED ORDER — SODIUM CHLORIDE 0.9% FLUSH: 3.0000 mL | INTRAVENOUS | Status: DC | PRN

## 2019-04-19 MED ORDER — PROPOFOL 10 MG/ML IV BOLUS
INTRAVENOUS | Status: DC | PRN
  Administered 2019-04-19: 20 mg via INTRAVENOUS
  Administered 2019-04-19: 30 mg via INTRAVENOUS

## 2019-04-19 MED ORDER — HEPARIN (PORCINE) IN NACL 1000-0.9 UT/500ML-% IV SOLN
INTRAVENOUS | Status: DC | PRN
  Administered 2019-04-19 (×2): 500 mL

## 2019-04-19 MED ORDER — CLOPIDOGREL BISULFATE 75 MG PO TABS: 300.0000 mg | ORAL_TABLET | Freq: Once | ORAL | Status: AC

## 2019-04-19 MED ORDER — ONDANSETRON HCL 4 MG/2ML IJ SOLN: 4.0000 mg | Freq: Four times a day (QID) | INTRAMUSCULAR | Status: DC | PRN

## 2019-04-19 MED ORDER — ACETAMINOPHEN 325 MG PO TABS
650.0000 mg | ORAL_TABLET | ORAL | Status: DC | PRN
  Administered 2019-04-21 – 2019-05-02 (×18): 650 mg via ORAL
  Filled 2019-04-19 (×2): qty 2

## 2019-04-19 MED ORDER — HEPARIN SODIUM (PORCINE) 1000 UNIT/ML IJ SOLN
INTRAMUSCULAR | Status: DC | PRN
  Administered 2019-04-19: 9000 [IU] via INTRAVENOUS

## 2019-04-19 MED ORDER — ATORVASTATIN CALCIUM 10 MG PO TABS
10.0000 mg | ORAL_TABLET | Freq: Every day | ORAL | Status: DC
  Administered 2019-04-19 – 2019-05-03 (×14): 10 mg via ORAL
  Filled 2019-04-19 (×14): qty 1

## 2019-04-19 MED ORDER — SODIUM CHLORIDE 0.9 % WEIGHT BASED INFUSION: 1.0000 mL/kg/h | INTRAVENOUS | Status: AC

## 2019-04-19 MED ORDER — LIDOCAINE HCL (PF) 1 % IJ SOLN
INTRAMUSCULAR | Status: AC
  Filled 2019-04-19: qty 30

## 2019-04-19 MED ORDER — HYDRALAZINE HCL 20 MG/ML IJ SOLN: 5.0000 mg | INTRAMUSCULAR | Status: DC | PRN

## 2019-04-19 MED ORDER — MIDAZOLAM HCL 2 MG/2ML IJ SOLN
INTRAMUSCULAR | Status: DC | PRN
  Administered 2019-04-19 (×2): 1 mg via INTRAVENOUS

## 2019-04-19 MED ORDER — HEPARIN SODIUM (PORCINE) 1000 UNIT/ML IJ SOLN
INTRAMUSCULAR | Status: AC
  Filled 2019-04-19: qty 1

## 2019-04-19 MED ORDER — CLOPIDOGREL BISULFATE 300 MG PO TABS
ORAL_TABLET | ORAL | Status: AC
  Filled 2019-04-19: qty 1

## 2019-04-19 MED ORDER — LABETALOL HCL 5 MG/ML IV SOLN
INTRAVENOUS | Status: AC
  Filled 2019-04-19: qty 4

## 2019-04-19 MED ORDER — CLOPIDOGREL BISULFATE 300 MG PO TABS
ORAL_TABLET | ORAL | Status: DC | PRN
  Administered 2019-04-19: 300 mg via ORAL

## 2019-04-19 MED ORDER — LABETALOL HCL 5 MG/ML IV SOLN: 10.0000 mg | INTRAVENOUS | Status: DC | PRN

## 2019-04-19 MED ORDER — MIDAZOLAM HCL 2 MG/2ML IJ SOLN
INTRAMUSCULAR | Status: AC
  Filled 2019-04-19: qty 2

## 2019-04-19 MED ORDER — FENTANYL CITRATE (PF) 100 MCG/2ML IJ SOLN
INTRAMUSCULAR | Status: DC | PRN
  Administered 2019-04-19 (×4): 25 ug via INTRAVENOUS

## 2019-04-19 MED ORDER — SODIUM CHLORIDE 0.9% FLUSH
3.0000 mL | Freq: Two times a day (BID) | INTRAVENOUS | Status: DC
  Administered 2019-04-20 – 2019-05-01 (×8): 3 mL via INTRAVENOUS

## 2019-04-19 MED ORDER — HEPARIN (PORCINE) IN NACL 1000-0.9 UT/500ML-% IV SOLN
INTRAVENOUS | Status: AC
  Filled 2019-04-19: qty 1000

## 2019-04-19 MED ORDER — SODIUM CHLORIDE 0.9 % IV SOLN: 250.0000 mL | INTRAVENOUS | Status: DC | PRN

## 2019-04-19 MED ORDER — LIDOCAINE HCL (PF) 1 % IJ SOLN
INTRAMUSCULAR | Status: DC | PRN
  Administered 2019-04-19: 12 mL

## 2019-04-19 MED ORDER — PROPOFOL 500 MG/50ML IV EMUL
INTRAVENOUS | Status: DC | PRN
  Administered 2019-04-19: 100 ug/kg/min via INTRAVENOUS

## 2019-04-19 MED ORDER — CLOPIDOGREL BISULFATE 75 MG PO TABS
75.0000 mg | ORAL_TABLET | Freq: Every day | ORAL | Status: DC
  Administered 2019-04-20 – 2019-05-03 (×13): 75 mg via ORAL
  Filled 2019-04-19 (×14): qty 1

## 2019-04-19 MED ORDER — FENTANYL CITRATE (PF) 100 MCG/2ML IJ SOLN
INTRAMUSCULAR | Status: AC
  Filled 2019-04-19: qty 2

## 2019-04-19 MED ORDER — LABETALOL HCL 5 MG/ML IV SOLN
INTRAVENOUS | Status: DC | PRN
  Administered 2019-04-19: 10 mg via INTRAVENOUS

## 2019-04-19 SURGICAL SUPPLY — 19 items
BAG SNAP BAND KOVER 36X36 (MISCELLANEOUS) ×3 IMPLANT
CATH OMNI FLUSH 5F 65CM (CATHETERS) ×3 IMPLANT
CLOSURE MYNX CONTROL 6F/7F (Vascular Products) ×3 IMPLANT
COVER DOME SNAP 22 D (MISCELLANEOUS) ×3 IMPLANT
DEVICE TORQUE .025-.038 (MISCELLANEOUS) ×3 IMPLANT
GUIDEWIRE ANGLED .035X150CM (WIRE) ×3 IMPLANT
KIT ENCORE 26 ADVANTAGE (KITS) ×3 IMPLANT
KIT MICROPUNCTURE NIT STIFF (SHEATH) ×3 IMPLANT
KIT PV (KITS) ×3 IMPLANT
SHEATH FLEX ANSEL ANG 6F 45CM (SHEATH) ×3 IMPLANT
SHEATH PINNACLE 5F 10CM (SHEATH) ×3 IMPLANT
SHEATH PINNACLE 6F 10CM (SHEATH) ×3 IMPLANT
SHEATH PROBE COVER 6X72 (BAG) ×3 IMPLANT
STENT OMNILINK ELITE 8X29X80 (Permanent Stent) ×3 IMPLANT
SYR MEDRAD MARK V 150ML (SYRINGE) ×3 IMPLANT
TRANSDUCER W/STOPCOCK (MISCELLANEOUS) ×3 IMPLANT
TRAY PV CATH (CUSTOM PROCEDURE TRAY) ×3 IMPLANT
WIRE BENTSON .035X145CM (WIRE) ×3 IMPLANT
WIRE ROSEN-J .035X180CM (WIRE) ×3 IMPLANT

## 2019-04-19 NOTE — Transfer of Care (Signed)
Immediate Anesthesia Transfer of Care Note  Patient: Thomas [name]  Procedure(s) Performed: TRANSESOPHAGEAL ECHOCARDIOGRAM (TEE) (N/A )  Patient Location: PACU  Anesthesia Type:MAC  Level of Consciousness: drowsy and patient cooperative  Airway & Oxygen Therapy: Patient Spontanous Breathing  Post-op Assessment: Report given to RN, Post -op Vital signs reviewed and stable and Patient moving all extremities X 4  Post vital signs: Reviewed and stable  Last Vitals:  Vitals Value Taken Time  BP    Temp    Pulse 92 04/19/19 0812  Resp 19 04/19/19 0812  SpO2 100 % 04/19/19 0812  Vitals shown include unvalidated device data.  Last Pain:  Vitals:   04/19/19 0710  TempSrc: Oral  PainSc: 0-No pain      Patients Stated Pain Goal: 0 (79/89/21 1941)  Complications: No apparent anesthesia complications

## 2019-04-19 NOTE — Progress Notes (Signed)
OT Cancellation Note  Patient Details Name: Thomas [name] MRN: [id number] DOB: Sep 07, 1975   Cancelled Treatment:    Reason Eval/Treat Not Completed: Patient at procedure or test/ unavailable. Pt off unit has been at multiple tests and/or procedures today and have not been able to see pt. Will follow up at a later time/date  Galen Manila 04/19/2019, 12:39 PM

## 2019-04-19 NOTE — Progress Notes (Signed)
[facility] [place]  Subjective  - dry gangrene stable to right great toes   Objective (!) 168/95 94 98 F (36.7 C) (Axillary) 15 100%  Intake/Output Summary (Last 24 hours) at 04/19/2019 1242 Last data filed at 04/19/2019 0805 Gross per 24 hour  Intake 2641.59 ml  Output 2000 ml  Net 641.59 ml    Dry gangrene to tips of right great toes  Palpable femoral pulses bilaterally  Laboratory Lab Results: Recent Labs    04/18/19 0537 04/19/19 0612  WBC 6.7 6.6  HGB 8.1* 8.5*  HCT 26.5* 27.7*  PLT 532* 516*   BMET Recent Labs    04/18/19 0537 04/19/19 0612  NA 138 138  K 4.1 3.9  CL 101 99  CO2 28 28  GLUCOSE 96 121*  BUN 9 7  CREATININE 0.71 0.73  CALCIUM 9.1 9.2    COAG Lab Results  Component Value Date   INR 1.3 (H) 04/13/2019   No results found for: PTT  Assessment/Planning:  Plan aortogram, right leg arteriogram and possible intervention for dry gangrene.  Risks and benefits discussed with patient through interpretor including risk of groin bleed etc.  [name] 04/19/2019 12:42 PM --

## 2019-04-19 NOTE — Anesthesia Procedure Notes (Signed)
Procedure Name: MAC Date/Time: 04/19/2019 7:40 AM Performed by: Larene Beach, CRNA Pre-anesthesia Checklist: Patient identified, Emergency Drugs available, Suction available and Patient being monitored Patient Re-evaluated:Patient Re-evaluated prior to induction Oxygen Delivery Method: Nasal cannula

## 2019-04-19 NOTE — Progress Notes (Signed)
PROGRESS NOTE  Thomas [name] GGY:[id number] DOB: 08-07-1975 DOA: 04/13/2019 PCP: Mackie Pai, PA-C  Brief History   Thomas [name] a 44 y.o.malewith medical history significant ofDM2 poorly controlled.  Recent hospitalization at [facility] for disseminated MRSA bacteremia, osteomyelitis and discitis.  Completed 6 weeks course of IV antibiotics on April 04, 2019.  Source of bacteremia thought to be from skin source (per previous facility report, area on toe that appeared cellulitic, area in R back that was erythematous and an abrasion of the lateral R aspect of his shin, abscess formation in the R calf and pyomyositis of the L vastus intermedius).  Developed necrosis of his R 1-5 digits since his discharge.    Presented here with worsening back pain, generalized weakness, fever and recent fall.  CT chest, abdomen and pelvis notable for findings of advanced osteomyelitis involving T9 as well as discitis.  Positive blood cultures for MRSA.  Seen by infectious disease, recommendations for IV Vancomycin.  Planned TEE on Monday.  Vascular surgery following for right 1st-5th toe dry gangrene. However, he wants to wait until current infection in controlled before proceeding with aortogram etc to address PAD and dry gangrene.  Pt underwent TEE today which did not demonstrate vegetations or other evidence of persistent endocarditis.   He was also taken to the vascular lab by Dr. Carlis Abbott for Aortogram, bilateral lower extremity arteriogram with runoff, right external iliac artery angioplasty with stent placement under moderate conscious sedation. This was performed critical ischemia of the right lower extremity with gangrene of digits 1-5 of the right foot.  Consultants  . Vascular surgery . Interventional radiology . Infectious disease . Wound Care  Procedures  . Aspiration of T9 to guide further therapy  Antibiotics   Anti-infectives (From admission, onward)   Start      Dose/Rate Route Frequency Ordered Stop   04/18/19 1800  vancomycin (VANCOCIN) IVPB 1000 mg/200 mL premix     1,000 mg 200 mL/hr over 60 Minutes Intravenous Every 12 hours 04/18/19 0718     04/14/19 1600  vancomycin (VANCOREADY) IVPB 1500 mg/300 mL  Status:  Discontinued     1,500 mg 150 mL/hr over 120 Minutes Intravenous Every 12 hours 04/14/19 1240 04/18/19 0718   04/13/19 2200  ceFEPIme (MAXIPIME) 2 g in sodium chloride 0.9 % 100 mL IVPB  Status:  Discontinued     2 g 200 mL/hr over 30 Minutes Intravenous Every 8 hours 04/13/19 1428 04/14/19 1028   04/13/19 1430  vancomycin (VANCOCIN) IVPB 1000 mg/200 mL premix  Status:  Discontinued     1,000 mg 200 mL/hr over 60 Minutes Intravenous Every 12 hours 04/13/19 1428 04/14/19 1240   04/13/19 1415  ceFEPIme (MAXIPIME) 2 g in sodium chloride 0.9 % 100 mL IVPB     2 g 200 mL/hr over 30 Minutes Intravenous  Once 04/13/19 1411 04/13/19 1505   04/13/19 1415  metroNIDAZOLE (FLAGYL) IVPB 500 mg     500 mg 100 mL/hr over 60 Minutes Intravenous  Once 04/13/19 1411 04/13/19 1600   04/13/19 1415  vancomycin (VANCOCIN) IVPB 1000 mg/200 mL premix  Status:  Discontinued     1,000 mg 200 mL/hr over 60 Minutes Intravenous  Once 04/13/19 1411 04/13/19 1428      Subjective  The patient was seen with the assistance of video interpretor.  Objective   Vitals:  Vitals:   04/19/19 1249 04/19/19 1416  BP:  (!) 169/99  Pulse:  (!) 102  Resp:  20  Temp:  98.7 F (37.1 C)  SpO2: 100% 99%   Exam:  Constitutional:  . The patient is awake, alert, and oriented x 3. No acute distress. Respiratory:  . No increased work of breathing. . No wheezes, rales, or rhonchi . No tactile fremitus Cardiovascular:  . Regular rate and rhythm . No murmurs, ectopy, or gallups. . No lateral PMI. No thrills. Abdomen:  . Abdomen is soft, non-tender, non-distended . No hernias, masses, or organomegaly . Normoactive bowel sounds.  Musculoskeletal:  . No cyanosis or  clubbing . 2-3+ pitting edema of lower extremities bilaterally . All 5 digits on right foot are blackened with dry gangrene. Skin:  . No rashes, lesions, ulcers . palpation of skin: no induration or nodules . Blackening of distal aspect of 5 digits on right foot. Neurologic:  . CN 2-12 intact . Sensation all 4 extremities intact Psychiatric:  . Mental status o Mood, affect appropriate o Orientation to person, place, time  . judgment and insight appear intact  I have personally reviewed the following:   Today's Data  . Vitals, CBC, BMP  Micro Data  . No growth of surveillance cultures.  Scheduled Meds: . atorvastatin  10 mg Oral q1800  . Chlorhexidine Gluconate Cloth  6 each Topical Q0600  . clopidogrel  300 mg Oral Once   Followed by  . [START ON 04/20/2019] clopidogrel  75 mg Oral Q breakfast  . insulin aspart  0-9 Units Subcutaneous TID WC  . insulin glargine  15 Units Subcutaneous QHS  . pneumococcal 23 valent vaccine  0.5 mL Intramuscular Tomorrow-1000  . senna-docusate  2 tablet Oral BID  . sodium chloride flush  3 mL Intravenous Once  . [START ON 04/20/2019] sodium chloride flush  3 mL Intravenous Q12H   Continuous Infusions: . sodium chloride 250 mL (04/13/19 2351)  . sodium chloride Stopped (04/13/19 2230)  . [START ON 04/20/2019] sodium chloride    . sodium chloride 1 mL/kg/hr (04/19/19 1425)  . lactated ringers 100 mL/hr at 04/18/19 2247  . vancomycin 1,000 mg (04/19/19 0626)    Principal Problem:   MRSA bacteremia Active Problems:   Diabetes mellitus type II, uncontrolled (HCC)   HTN (hypertension)   Osteomyelitis of spine (HCC)   Rectal mass   DVT of popliteal vein (HCC)   Gangrene of toe of right foot (HCC)   Acute right-sided thoracic back pain   Discitis of thoracolumbar region   Endocarditis of mitral valve   LOS: 6 days    A & P  Recurrent thoracic spine discitis/vertebral osteomyelitis/MRSA bacteremia: Blood culture drawn on 04/13/2019 + for  MRSA times 2 out of 2 bottles. Infectious disease has been consulted, recommendation for IV vancomycin for now. Repeated blood cultures on 04/14/2019 - to date.  Continue to follow cultures. MRI independently reviewed showed progressive T9 osteomyelitis with increased cultures evaluation and vertebral body collapse.  New discitis at T8-9 and T9-T10 with T8 and T10 osteomyelitis.  Moderate to severe spinal stenosis at T8-T9. Persistent extensive anterior paravertebral soft tissue inflammation/phlegmon. Decreased posterior paraspinal inflammation without residual or new abscess. Cardiology has been consulted for TEE to assess for endocarditis. This was performed on 04/18/2018 and demonstrated no vegetations or evidence of persistent endocarditis. Pain management in place.Continue bowel regimen.  MRSA bacteremia: Management as stated above. Currently afebrile with no leukocytosis. Continue to monitor WBC and fever curve.  T9 abscess status post aspiration by interventional radiology on 04/15/2019: Deep tissue culture growing staph aureus, continue to monitor cultures. Continue  pain management and IV antibiotics.  Recurrent thoracic vertebral osteomyelitis: Previously treated with 6 weeks of IV antibiotics with IV daptomycin on 04/04/2019. Significantly elevated CRP 19.4 and sed rate >140. Management as per above.  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 Chestine Spore. He wishes to wait to ensure clearance of bacteremia prior to procedure in case a stent is needed. The patient was quite upset upon hearing this. He believed that his toes were only "dry". The patient was taken to the vascular lab by Dr. Chestine Spore today where he underwent bilateral arteriogram with bilateral runoff and stent placement.   History of DVT: On Eliquis which is on hold due to possible procedure, held since 04/14/2019.  Possible rectal mass: Monitor. May require GI  evaluation when more stable and bacteremia has cleared.  Type 2 diabetes with hyperglycemia: Hemoglobin A1c 8.3 on 04/14/2019. Continue insulin coverage.  Essential hypertension: Continue to hold off antihypertensives for now due to soft blood pressure.  Hyperlipidemia: Continue Robaxin.  Diabetic polyneuropathy: Continue gabapentin  I have seen and examined this patient myself. I have spent 30 minutes in his evaluation and care.  DVT prophylaxis: SCDs. Code Status:Full Family Communication: None at bedside. Disposition Plan: Home when hemodynamically stable, vascular surgery and infectious disease have signed off.    Britten Seyfried, DO [facility] Direct contact: see [email or web address]  7PM-7AM contact night coverage as above 04/19/2019, 4:49 PM  LOS: 4 days

## 2019-04-19 NOTE — Progress Notes (Signed)
PT Cancellation Note  Patient Details Name: Grantland Want MRN: [id number] DOB: 1975-07-09   Cancelled Treatment:    Reason Eval/Treat Not Completed: Patient at procedure or test/unavailable made multiple attempts to see patient, he has been at multiple procedures and unavailable for PT to work with him. Will attempt to return if time/schedule allow.    Madelaine Etienne, DPT, PN1   Supplemental Physical Therapist [facility]    Pager [phone] Acute Rehab Office [phone]

## 2019-04-19 NOTE — Op Note (Signed)
Patient name: Thomas [name] MRN: [id number] DOB: 02/26/1976 Sex: male  04/19/2019 Pre-operative Diagnosis: Critical limb ischemia of the right lower extremity with tissue loss (dry gangrene of right toes 1-5) Post-operative diagnosis:  Same Surgeon:  [name], MD Procedure Performed: 1.  Ultrasound-guided access of the left common femoral artery 2.  Aortogram 3.  Bilateral lower extremity arteriogram with runoff 4.  Right external iliac artery angioplasty with stent placement (8 mm x 29 mm balloon expandable bare-metal stent, Abbott Omnilink) 5.  Mynx closure of the left common femoral artery 6.  80 minutes of monitored moderate conscious sedation time  Indications: Patient is a 44 year old male with complex medical history including prolonged hospital admission for MRSA bacteremia including history of endocarditis and now discitis.  Ultimately vascular surgery was consulted for dry gangrene of the right lower extremity toes 1 through 5.  He presents today for aortogram, lower extremity arteriogram, possible intervention after risk and benefits were discussed.  Preoperative noninvasive imaging suggested monophasic runoff at the right lower extremity ankle.  Findings:   Aortogram showed patent single renal arteries bilaterally.  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  There was no other flow-limiting lesions in the aortoiliac segment.  Both hypogastric arteries are patent.  Right lower extremity arteriogram shows a patent common femoral, profunda, and SFA as well as above-knee popliteal artery.  Patient occludes his below-knee popliteal artery and reconstitutes a tibial peroneal trunk with two-vessel runoff via the posterior tibial and peroneal artery.  Left lower extremity arteriogram shows a patent common femoral and profunda.  He has a patent SFA with a 50% stenosis short  segment in the mid portion of the SFA.  The above and below-knee popliteal arteries are patent.  Appears to have an occluded tibioperoneal trunk with reconstitution of peroneal and posterior tibial artery.  Tibial runoff is difficult to evaluate on the left due to contrast timing.  The right external iliac high-grade stenosis was treated with a 8 mm x 29 mm balloon expandable bare-metal stent.  There was poststenotic dilation here in the external iliac artery distal to the lesion.  There is no evidence of residual stenosis.  He has a much better right femoral pulse.   Procedure:  The patient was identified in the holding area and taken to room 8.  The patient was then placed supine on the table and prepped and draped in the usual sterile fashion.  A time out was called.  Ultrasound was used to evaluate the left common femoral artery.  It was patent .  A digital ultrasound image was acquired.  A micropuncture needle was used to access the left common femoral artery under ultrasound guidance.  An 018 wire was advanced without resistance and a micropuncture sheath was placed.  The 018 wire was removed and a benson wire was placed.  The micropuncture sheath was exchanged for a 5 french sheath.  An omniflush catheter was advanced over the wire to the level of L-1.  An abdominal angiogram was obtained.  Next catheter was pulled down and bilateral lower extremity runoff was obtained.  Runoff was fairly brisk on the right lower extremity which was the side of interest.  It was difficult to evaluate tibial runoff due to timing.  Subsequently used a Omni Flush catheter with a soft angled Glidewire and selected the right iliac and ultimately passed our catheter down the  right SFA.  We got some staged imaging of the below-knee popliteal artery and runoff in the right lower extremity.  Pertinent findings are noted above but will need to be considered for SFA to tibioperoneal trunk bypass.  On initial aortogram imaging, I  thought we saw a high-grade lesion in the right external iliac artery.  We subsequently got more dedicated imaging with steep left oblique and this confirmed a proximal right external iliac artery high-grade stenosis.  Ultimately this lesion was crossed again with a soft angled Glidewire and we advanced our Omni down into the right SFA.  Exchanged for a Rosen wire for more support.  Patient was given 100 units/kg heparin.  A long 6 [place] Ansell sheath was then advanced in the left groin up over the aortic bifurcation and placed into the proximal right common iliac.  We got hand-injection to identify the lesion.  Ultimately selected a balloon expandable bare-metal stent.  Ultimately a 8 mm x 29 mm Abbott Omnilink stent was deployed across the lesion to nominal pressure.  The delivery device was removed.  Another hand-injection showed there was no residual stenosis with excellent flow down the right external iliac.  I think he had post stenotic dilation here and I did not want to be more aggressive with stent placement.  At that point in time exchanged for a short 6 French sheath in the left common femoral artery.  A mynx closure device was deployed.  Tolerated the procedure without any apparent complications.   Plan: Patient will need to be considered for a right SFA to tibioperoneal trunk bypass after right external iliac stent today.  Plavix loaded in cath lab.  Vein mapping has been ordered.  Marty Heck, MD [facility] [place] Office: [place]

## 2019-04-19 NOTE — CV Procedure (Signed)
    TRANSESOPHAGEAL ECHOCARDIOGRAM   NAME:  Thomas [name]   MRN: [id number] DOB:  [birth year]   ADMIT DATE: 04/13/2019  INDICATIONS: Bacteremia  PROCEDURE:   Informed consent was obtained prior to the procedure. The risks, benefits and alternatives for the procedure were discussed and the patient comprehended these risks.  Risks include, but are not limited to, cough, sore throat, vomiting, nausea, somnolence, esophageal and stomach trauma or perforation, bleeding, low blood pressure, aspiration, pneumonia, infection, trauma to the teeth and death.    Procedural time out performed. The oropharynx was anesthetized with topical 1% benzocaine.    Anesthesia was administered by Dr Desmond Lope and Janeann Forehand, CRNA.    The transesophageal probe was inserted in the esophagus and stomach without difficulty and multiple views were obtained.    COMPLICATIONS:    There were no immediate complications.  FINDINGS:  LEFT VENTRICLE: EF = 50-55%%. Inferior hypokinesis  RIGHT VENTRICLE: Normal size and function.   LEFT ATRIUM: No thrombus/mass.  LEFT ATRIAL APPENDAGE: No thrombus/mass.   RIGHT ATRIUM: No thrombus/mass.  AORTIC VALVE:  Trileaflet. No regurgitation. No vegetation.  MITRAL VALVE:    Normal structure. Trivial regurgitation. No vegetation.  TRICUSPID VALVE: Normal structure. No regurgitation. No vegetation.  PULMONIC VALVE: Grossly normal structure. No regurgitation. No apparent vegetation.  INTERATRIAL SEPTUM: No PFO or ASD seen by color Doppler.  PERICARDIUM: No effusion noted.  DESCENDING AORTA: Normal size  CONCLUSION: No vegetation seen.    Epifanio Lesches MD [facility]  [address], [address] [place], [place] [address] [phone]   8:20 AM

## 2019-04-19 NOTE — Anesthesia Postprocedure Evaluation (Signed)
Anesthesia Post Note  Patient: Colbin Jovel  Procedure(s) Performed: TRANSESOPHAGEAL ECHOCARDIOGRAM (TEE) (N/A )     Patient location during evaluation: Endoscopy Anesthesia Type: MAC Level of consciousness: awake and alert Pain management: pain level controlled Vital Signs Assessment: post-procedure vital signs reviewed and stable Respiratory status: spontaneous breathing, nonlabored ventilation, respiratory function stable and patient connected to nasal cannula oxygen Cardiovascular status: stable and blood pressure returned to baseline Postop Assessment: no apparent nausea or vomiting Anesthetic complications: no    Last Vitals:  Vitals:   04/19/19 0820 04/19/19 1249  BP: (!) 168/95   Pulse: 94   Resp: 15   Temp:    SpO2: 100% 100%    Last Pain:  Vitals:   04/19/19 1253  TempSrc:   PainSc: 8                  Catalina Gravel

## 2019-04-19 NOTE — Progress Notes (Signed)
Patient ID: Thomas [name], male   DOB: 1975-08-17, 44 y.o.   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  He was hospitalized on 01/31/2019 with MRSA bacteremia complicated by lower thoracic spine infection and mitral valve endocarditis.  He had persistent bacteremia early in therapy and was switched from vancomycin to daptomycin completing therapy on 04/04/2019.  He started to feel worse shortly after completing antibiotic therapy and noted some worrisome changes in the toes of his right foot leading to readmission here on 04/14/2019.  Both blood cultures from 04/13/2019 and 1 of 2 from 04/14/2019 have grown MRSA.  MRI showed progressive infection at the T9 level with new discitis at T8 and T10.  MRI of his right foot showed no definite osteomyelitis or abscess.  He underwent TEE today which did not show any vegetations or other evidence of persistent endocarditis.  He was febrile on admission but has been afebrile ever since.  Plan: 1. Continue vancomycin 2. Repeat blood cultures        Cliffton Asters, MD [facility] for Infectious Disease [facility] [phone] pager   [phone] cell 04/19/2019, 12:02 PM

## 2019-04-19 NOTE — Progress Notes (Signed)
Lower extremity vein mapping has been completed.   Preliminary results in CV Proc.   Blanch Media 04/19/2019 3:50 PM

## 2019-04-19 NOTE — Progress Notes (Signed)
  Echocardiogram Echocardiogram Transesophageal has been performed.  Thomas [name] 04/19/2019, 8:18 AM

## 2019-04-19 NOTE — Interval H&P Note (Signed)
History and Physical Interval Note:  04/19/2019 7:25 AM  Thomas [name]  has presented today for surgery, with the diagnosis of bacteremia.  The various methods of treatment have been discussed with the patient and family. After consideration of risks, benefits and other options for treatment, the patient has consented to  Procedure(s): TRANSESOPHAGEAL ECHOCARDIOGRAM (TEE) (N/A) as a surgical intervention.  The patient's history has been reviewed, patient examined, no change in status, stable for surgery.  I have reviewed the patient's chart and labs.  Questions were answered to the patient's satisfaction.     [name]

## 2019-04-20 LAB — CBC WITH DIFFERENTIAL/PLATELET
Abs Immature Granulocytes: 0.02 10*3/uL (ref 0.00–0.07)
Basophils Absolute: 0 10*3/uL (ref 0.0–0.1)
Basophils Relative: 0 %
Eosinophils Absolute: 0.1 10*3/uL (ref 0.0–0.5)
Eosinophils Relative: 2 %
HCT: 25.4 % — ABNORMAL LOW (ref 39.0–52.0)
Hemoglobin: 7.9 g/dL — ABNORMAL LOW (ref 13.0–17.0)
Immature Granulocytes: 0 %
Lymphocytes Relative: 27 %
Lymphs Abs: 1.5 10*3/uL (ref 0.7–4.0)
MCH: 24.2 pg — ABNORMAL LOW (ref 26.0–34.0)
MCHC: 31.1 g/dL (ref 30.0–36.0)
MCV: 77.7 fL — ABNORMAL LOW (ref 80.0–100.0)
Monocytes Absolute: 0.6 10*3/uL (ref 0.1–1.0)
Monocytes Relative: 11 %
Neutro Abs: 3.5 10*3/uL (ref 1.7–7.7)
Neutrophils Relative %: 60 %
Platelets: 497 10*3/uL — ABNORMAL HIGH (ref 150–400)
RBC: 3.27 MIL/uL — ABNORMAL LOW (ref 4.22–5.81)
RDW: 14.6 % (ref 11.5–15.5)
WBC: 5.8 10*3/uL (ref 4.0–10.5)
nRBC: 0 % (ref 0.0–0.2)

## 2019-04-20 LAB — AEROBIC/ANAEROBIC CULTURE W GRAM STAIN (SURGICAL/DEEP WOUND)
Gram Stain: NONE SEEN
Gram Stain: NONE SEEN
Special Requests: NORMAL
Special Requests: NORMAL

## 2019-04-20 LAB — CULTURE, BLOOD (ROUTINE X 2): Special Requests: ADEQUATE

## 2019-04-20 LAB — VANCOMYCIN, PEAK: Vancomycin Pk: 24 ug/mL — ABNORMAL LOW (ref 30–40)

## 2019-04-20 LAB — GLUCOSE, CAPILLARY
Glucose-Capillary: 173 mg/dL — ABNORMAL HIGH (ref 70–99)
Glucose-Capillary: 138 mg/dL — ABNORMAL HIGH (ref 70–99)
Glucose-Capillary: 138 mg/dL — ABNORMAL HIGH (ref 70–99)
Glucose-Capillary: 150 mg/dL — ABNORMAL HIGH (ref 70–99)

## 2019-04-20 LAB — BASIC METABOLIC PANEL
Anion gap: 9 (ref 5–15)
BUN: 8 mg/dL (ref 6–20)
CO2: 25 mmol/L (ref 22–32)
Calcium: 8.6 mg/dL — ABNORMAL LOW (ref 8.9–10.3)
Chloride: 103 mmol/L (ref 98–111)
Creatinine, Ser: 0.71 mg/dL (ref 0.61–1.24)
GFR calc Af Amer: >60 mL/min (ref >60)
GFR calc non Af Amer: >60 mL/min (ref >60)
Glucose, Bld: 168 mg/dL — ABNORMAL HIGH (ref 70–99)
Potassium: 3.8 mmol/L (ref 3.5–5.1)
Sodium: 137 mmol/L (ref 135–145)

## 2019-04-20 MED ORDER — OXYCODONE HCL 5 MG PO TABS
5.0000 mg | ORAL_TABLET | ORAL | Status: DC | PRN
  Administered 2019-04-20: 23:00:00 5 mg via ORAL
  Administered 2019-04-22 – 2019-04-30 (×14): 10 mg via ORAL
  Filled 2019-04-20 (×14): qty 2
  Filled 2019-04-20: qty 1
  Filled 2019-04-20 (×4): qty 2

## 2019-04-20 MED ORDER — HYDROMORPHONE HCL 1 MG/ML IJ SOLN
1.0000 mg | INTRAMUSCULAR | Status: DC | PRN
  Administered 2019-04-20 – 2019-04-22 (×11): 1 mg via INTRAVENOUS
  Filled 2019-04-20 (×14): qty 1

## 2019-04-20 MED ORDER — HYDROMORPHONE HCL 1 MG/ML IJ SOLN: 1.0000 mg | INTRAMUSCULAR | Status: DC | PRN

## 2019-04-20 MED ORDER — SIMETHICONE 80 MG PO CHEW
80.0000 mg | CHEWABLE_TABLET | Freq: Four times a day (QID) | ORAL | Status: DC | PRN
  Administered 2019-04-20: 80 mg via ORAL
  Filled 2019-04-20 (×2): qty 1

## 2019-04-20 MED ORDER — SACCHAROMYCES BOULARDII 250 MG PO CAPS
250.0000 mg | ORAL_CAPSULE | Freq: Two times a day (BID) | ORAL | Status: DC
  Administered 2019-04-20 – 2019-05-03 (×25): 250 mg via ORAL
  Filled 2019-04-20 (×25): qty 1

## 2019-04-20 MED ORDER — DICYCLOMINE HCL 10 MG PO CAPS
10.0000 mg | ORAL_CAPSULE | Freq: Three times a day (TID) | ORAL | Status: AC
  Administered 2019-04-21: 10 mg via ORAL
  Filled 2019-04-20: qty 1

## 2019-04-20 NOTE — Progress Notes (Signed)
PT Cancellation Note  Patient Details Name: Thomas [name] MRN: [id number] DOB: 1976/02/24   Cancelled Treatment:    Reason Eval/Treat Not Completed: Patient declined, no reason specified patient politely declines PT this morning due to pain and concern of his surgery yesterday. PT/RN educated he is now safe to mobilize after surgery, he continues to refuse due to back pain. Will attempt to return later if time/schedule allow.    Madelaine Etienne, DPT, PN1   Supplemental Physical Therapist [facility]    Pager [phone] Acute Rehab Office [phone]

## 2019-04-20 NOTE — Progress Notes (Addendum)
  Progress Note    04/20/2019 7:48 AM 1 Day Post-Op  Subjective:  Minimal soreness L groin cath site.   Vitals:   04/20/19 0204 04/20/19 0412  BP:  132/76  Pulse: 89 95  Resp: 12 15  Temp:  98.4 F (36.9 C)  SpO2: 100% 99%   Physical Exam: Incisions:  L groin cath site soft without hematoma Extremities:  Motor intact R foot; gangrene toes 1-5; R femoral pulse palpable Neurologic: A&O  CBC    Component Value Date/Time   WBC 5.8 04/20/2019 0302   RBC 3.27 (L) 04/20/2019 0302   HGB 7.9 (L) 04/20/2019 0302   HCT 25.4 (L) 04/20/2019 0302   PLT 497 (H) 04/20/2019 0302   MCV 77.7 (L) 04/20/2019 0302   MCH 24.2 (L) 04/20/2019 0302   MCHC 31.1 04/20/2019 0302   RDW 14.6 04/20/2019 0302   LYMPHSABS 1.5 04/20/2019 0302   MONOABS 0.6 04/20/2019 0302   EOSABS 0.1 04/20/2019 0302   BASOSABS 0.0 04/20/2019 0302    BMET    Component Value Date/Time   NA 137 04/20/2019 0302   K 3.8 04/20/2019 0302   CL 103 04/20/2019 0302   CO2 25 04/20/2019 0302   GLUCOSE 168 (H) 04/20/2019 0302   BUN 8 04/20/2019 0302   CREATININE 0.71 04/20/2019 0302   CREATININE 0.98 11/14/2016 1352   CALCIUM 8.6 (L) 04/20/2019 0302   GFRNONAA >60 04/20/2019 0302   GFRNONAA >89 11/14/2016 1352   GFRAA >60 04/20/2019 0302   GFRAA >89 11/14/2016 1352    INR    Component Value Date/Time   INR 1.3 (H) 04/13/2019 1247     Intake/Output Summary (Last 24 hours) at 04/20/2019 0748 Last data filed at 04/20/2019 0148 Gross per 24 hour  Intake 1273.49 ml  Output 1180 ml  Net 93.49 ml     Assessment/Plan:  44 y.o. male is s/p R EIA stent 1 Day Post-Op   Palpable femoral pulse s/p R EIA stent Based on angiography, plan is for R SFA to TP trunk bypass given tissue loss of R foot Vein mapping performed; R GSV marginal however we will re-assess intraoperatively Dr. Chestine Spore planning the above surgery for Thursday 04/21/18    Emilie Rutter, PA-C [facility] [phone] 04/20/2019 7:48 AM  I have seen and evaluated the patient. I agree with the PA note as documented above. POD#1 s/p R EIA stent.  Needs right SFA to TP trunk bypass for popliteal occlusion in addition to iliac lesion.  Scheduled for Thursday afternoon.  Updated patient plan of care.  No groin hematoma in left groin.  Right femoral pulse better after stent.  Good doppler signals in right foot with dry gangrene.  [name], MD [facility] [place] Office: [phone]

## 2019-04-20 NOTE — Progress Notes (Signed)
PROGRESS NOTE  Thomas [name] QTM:[id number] DOB: 01-31-1976 DOA: 04/13/2019 PCP: [name], PA-C  Brief History   Thomas Mohammedis a 44 y.o.malewith medical history significant ofDM2 poorly controlled.  Recent hospitalization at [facility] for disseminated MRSA bacteremia, osteomyelitis and discitis.  Completed 6 weeks course of IV antibiotics on April 04, 2019.  Source of bacteremia thought to be from skin source (per previous facility report, area on toe that appeared cellulitic, area in R back that was erythematous and an abrasion of the lateral R aspect of his shin, abscess formation in the R calf and pyomyositis of the L vastus intermedius).  Developed necrosis of his R 1-5 digits since his discharge.    Presented here with worsening back pain, generalized weakness, fever and recent fall.  CT chest, abdomen and pelvis notable for findings of advanced osteomyelitis involving T9 as well as discitis.  Positive blood cultures for MRSA.  Seen by infectious disease, recommendations for IV Vancomycin.  Planned TEE on Monday.  Vascular surgery following for right 1st-5th toe dry gangrene. However, he wants to wait until current infection in controlled before proceeding with aortogram etc to address PAD and dry gangrene.  Pt underwent TEE today which did not demonstrate vegetations or other evidence of persistent endocarditis.   He was also taken to the vascular lab by Dr. Chestine Spore for Aortogram, bilateral lower extremity arteriogram with runoff, right external iliac artery angioplasty with stent placement under moderate conscious sedation. This was performed critical ischemia of the right lower extremity with gangrene of digits 1-5 of the right foot. Dr. Chestine Spore also plans on right SFA to TP trunk bypass for popliteal occlusion in addition to iliac lesion. This is planned fo 04/22/2019.  Consultants  . Vascular surgery . Interventional radiology . Infectious disease . Wound Care   Procedures  . Aspiration of T9 to guide further therapy . Angiogram of bilateral lower extremities with stent placement in Rt EIA 04/19/2019.  Antibiotics   Anti-infectives (From admission, onward)   Start     Dose/Rate Route Frequency Ordered Stop   04/18/19 1800  vancomycin (VANCOCIN) IVPB 1000 mg/200 mL premix     1,000 mg 200 mL/hr over 60 Minutes Intravenous Every 12 hours 04/18/19 0718     04/14/19 1600  vancomycin (VANCOREADY) IVPB 1500 mg/300 mL  Status:  Discontinued     1,500 mg 150 mL/hr over 120 Minutes Intravenous Every 12 hours 04/14/19 1240 04/18/19 0718   04/13/19 2200  ceFEPIme (MAXIPIME) 2 g in sodium chloride 0.9 % 100 mL IVPB  Status:  Discontinued     2 g 200 mL/hr over 30 Minutes Intravenous Every 8 hours 04/13/19 1428 04/14/19 1028   04/13/19 1430  vancomycin (VANCOCIN) IVPB 1000 mg/200 mL premix  Status:  Discontinued     1,000 mg 200 mL/hr over 60 Minutes Intravenous Every 12 hours 04/13/19 1428 04/14/19 1240   04/13/19 1415  ceFEPIme (MAXIPIME) 2 g in sodium chloride 0.9 % 100 mL IVPB     2 g 200 mL/hr over 30 Minutes Intravenous  Once 04/13/19 1411 04/13/19 1505   04/13/19 1415  metroNIDAZOLE (FLAGYL) IVPB 500 mg     500 mg 100 mL/hr over 60 Minutes Intravenous  Once 04/13/19 1411 04/13/19 1600   04/13/19 1415  vancomycin (VANCOCIN) IVPB 1000 mg/200 mL premix  Status:  Discontinued     1,000 mg 200 mL/hr over 60 Minutes Intravenous  Once 04/13/19 1411 04/13/19 1428      Subjective  The patient  was seen with the assistance of video interpreter.  Objective   Vitals:  Vitals:   04/20/19 0204 04/20/19 0412  BP:  132/76  Pulse: 89 95  Resp: 12 15  Temp:  98.4 F (36.9 C)  SpO2: 100% 99%   Exam:  Constitutional:  . The patient is awake, alert, and oriented x 3. His pain is not well controlled currently. Respiratory:  . No increased work of breathing. . No wheezes, rales, or rhonchi . No tactile fremitus Cardiovascular:  . Regular rate and  rhythm . No murmurs, ectopy, or gallups. . No lateral PMI. No thrills. Abdomen:  . Abdomen is soft, non-tender, non-distended . No hernias, masses, or organomegaly . Normoactive bowel sounds.  Musculoskeletal:  . No cyanosis or clubbing . 2-3+ pitting edema of lower extremities bilaterally . All 5 digits on right foot are blackened with dry gangrene. Skin:  . No rashes, lesions, ulcers . palpation of skin: no induration or nodules . Blackening of distal aspect of 5 digits on right foot. Neurologic:  . CN 2-12 intact . Sensation all 4 extremities intact Psychiatric:  . Mental status o Mood, affect appropriate o Orientation to person, place, time  . judgment and insight appear intact  I have personally reviewed the following:   Today's Data  . Vitals, CBC, BMP  Micro Data  . No growth of surveillance cultures.  Scheduled Meds: . atorvastatin  10 mg Oral q1800  . clopidogrel  75 mg Oral Q breakfast  . insulin aspart  0-9 Units Subcutaneous TID WC  . insulin glargine  15 Units Subcutaneous QHS  . pneumococcal 23 valent vaccine  0.5 mL Intramuscular Tomorrow-1000  . senna-docusate  2 tablet Oral BID  . sodium chloride flush  3 mL Intravenous Once  . sodium chloride flush  3 mL Intravenous Q12H   Continuous Infusions: . sodium chloride 250 mL (04/13/19 2351)  . sodium chloride Stopped (04/13/19 2230)  . sodium chloride    . lactated ringers 100 mL/hr at 04/20/19 0148  . vancomycin 1,000 mg (04/20/19 1720)    Principal Problem:   MRSA bacteremia Active Problems:   Diabetes mellitus type II, uncontrolled (HCC)   HTN (hypertension)   Osteomyelitis of spine (HCC)   Rectal mass   DVT of popliteal vein (HCC)   Gangrene of toe of right foot (HCC)   Acute right-sided thoracic back pain   Discitis of thoracolumbar region   Endocarditis of mitral valve   LOS: 7 days    A & P  Recurrent thoracic spine discitis/vertebral osteomyelitis/MRSA bacteremia: Blood culture  drawn on 04/13/2019 + for MRSA times 2 out of 2 bottles. Infectious disease has been consulted, recommendation for IV vancomycin for now. Repeated blood cultures on 04/14/2019 - to date. Continue to follow cultures. MRI independently reviewed showed progressive T9 osteomyelitis with increased cultures evaluation and vertebral body collapse.  New discitis at T8-9 and T9-T10 with T8 and T10 osteomyelitis.  Moderate to severe spinal stenosis at T8-T9. Persistent extensive anterior paravertebral soft tissue inflammation/phlegmon. Decreased posterior paraspinal inflammation without residual or new abscess. Cardiology has been consulted for TEE to assess for endocarditis. This was performed on 04/18/2018 and demonstrated no vegetations or evidence of persistent endocarditis. Pain management in place.Continue bowel regimen.  MRSA bacteremia: Management as stated above. Currently afebrile with no leukocytosis. Continue to monitor WBC and fever curve.  T9 abscess status post aspiration by interventional radiology on 04/15/2019: Deep tissue culture growing staph aureus, continue to monitor cultures. Continue  pain management and IV antibiotics.  Recurrent thoracic vertebral osteomyelitis: Previously treated with 6 weeks of IV antibiotics with IV daptomycin on 04/04/2019. Significantly elevated CRP 19.4 and sed rate >140. Management as per above.  Peripheral artery disease: Right first through fifth toes dry gangrene: Vascular surgery following with possible intervention on Monday with Dr. Carlis Abbott. He wishes to wait to ensure clearance of bacteremia prior to procedure in case a stent is needed. The patient was quite upset upon hearing this. He believed that his toes were only "dry". The patient was taken to the vascular lab by Dr. Carlis Abbott today where he underwent bilateral arteriogram with bilateral runoff and stent placement in Rt EIA. Plans are for the patient to return to vascular lab on 04/22/2019 for  right SFA to TP  trunk bypass for popliteal occlusion in addition to iliac lesion.   History of DVT: On Eliquis which is on hold due to possible procedure, held since 04/14/2019.  Possible rectal mass: Monitor. May require GI evaluation when more stable and bacteremia has cleared.  Type 2 diabetes with hyperglycemia: Hemoglobin A1c 8.3 on 04/14/2019. Continue insulin coverage.  Essential hypertension: Continue to hold off antihypertensives for now due to soft blood pressure.  Hyperlipidemia: Continue Robaxin.  Diabetic polyneuropathy: Continue gabapentin  I have seen and examined this patient myself. I have spent 32 minutes in his evaluation and care.  DVT prophylaxis: SCDs. Code Status:Full Family Communication: None at bedside. Disposition Plan: Home when hemodynamically stable, vascular surgery and infectious disease have signed off.    Leimomi Zervas, DO [facility] Direct contact: see [email or web address]  7PM-7AM contact night coverage as above 04/20/2019, 5:33 PM  LOS: 4 days

## 2019-04-20 NOTE — Progress Notes (Signed)
Patient ID: Thomas [name], male   DOB: [birth year], 44 y.o.   MRN: [id number]          [facility] Disease    Date of Admission:  04/13/2019     Day 8 vancomycin           ASSESSMENT: Mr. 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  He was hospitalized on 01/31/2019 with MRSA bacteremia complicated by lower thoracic spine infection and mitral valve endocarditis; now with more progressive infection at the T9 level with new discitis at T8 and T10. TEE did not show any vegetations or other evidence of persistent endocarditis.  Initially had persistent bacteremia 12/29 and 12/30 but 1/04 are without growth thus far and he has remained afebrile. Will continue with IV vancomycin - will likely need 6-8 weeks of IV antibiotics and possible extension of PO thereafter.  Will need to ensure no ongoing bacteremia prior to PICC line placement.  He is going for more revascularization tomorrow.   Will defer pain medication recommendations to Dr. Benny Lennert - explained that it will take time to treat this new area of infection prior to noticeable improvement. He has no neurologic compromise on exam and seems to be coming along. Back brace may be helpful out of bed since this is typically when he experiences the most pain.   Sed Rate (mm/hr)  Date Value  04/15/2019 >140 (H)   CRP (mg/dL)  Date Value  04/15/2019 19.4 (H)    PLAN: 1. Continue vancomycin IV  2. Follow micro data to help with timing of PICC line   Principal Problem:   MRSA bacteremia Active Problems:   Diabetes mellitus type II, uncontrolled ([place])   HTN (hypertension)   Osteomyelitis of spine (HCC)   Rectal mass   DVT of popliteal vein (HCC)   Gangrene of toe of right foot ([place])   Acute right-sided thoracic back pain   Discitis of thoracolumbar region   Endocarditis of mitral valve   . atorvastatin  10 mg Oral q1800  . clopidogrel  75 mg Oral Q breakfast  . insulin aspart  0-9 Units  Subcutaneous TID WC  . insulin glargine  15 Units Subcutaneous QHS  . pneumococcal 23 valent vaccine  0.5 mL Intramuscular Tomorrow-1000  . senna-docusate  2 tablet Oral BID  . sodium chloride flush  3 mL Intravenous Once  . sodium chloride flush  3 mL Intravenous Q12H    SUBJECTIVE: Stratus interpretor used during visit.  Feeling better but still with back pain ongoing. He is also describing constipation but hopes it improves with laxatives. Right foot feels better - he is "happy it is warm".   He finds that the pain medication he gets only offers him 2.5 hours of relief and requesting consideration for something that would offer longer term relief. Denies any weakness in legs.   Review of Systems: Review of Systems  Constitutional: Negative for chills, fever and malaise/fatigue.  Respiratory: Negative for cough and shortness of breath.   Gastrointestinal: Positive for constipation. Negative for abdominal pain and diarrhea.  Genitourinary: Negative for dysuria.  Musculoskeletal: Positive for back pain. Negative for myalgias and neck pain.  Skin: Negative for rash.    No Known Allergies  OBJECTIVE: Vitals:   04/19/19 1958 04/19/19 2031 04/20/19 0204 04/20/19 0412  BP: (!) 146/83   132/76  Pulse: (!) 102 (!) 102 89 95  Resp: '20 17 12 15  ' Temp: 98.7 F (37.1 C)   98.4 F (  36.9 C)  TempSrc: Oral   Oral  SpO2: 100% 99% 100% 99%  Weight:    88.3 kg  Height:       Body mass index is 27.93 kg/m.  Physical Exam Constitutional:      Appearance: Normal appearance. He is not ill-appearing.     Comments: Sitting upright in bed talking on the phone.   HENT:     Mouth/Throat:     Mouth: Mucous membranes are moist.     Pharynx: Oropharynx is clear.  Eyes:     General: No scleral icterus. Cardiovascular:     Rate and Rhythm: Normal rate and regular rhythm.     Heart sounds: No murmur.     Comments: Decreased pulses in right foot but today it feels normal temperature    Musculoskeletal:     Cervical back: Normal range of motion.     Comments: Sitting in bed comfortably. Good sensation bilaterally to LEs and able to raise to gravity easily off bed.   Skin:    General: Skin is warm and dry.     Comments: Dry necrotic digits on the right toes. Stable and without periwound erythema. No pain. Has some scabbed ulcers along the right lateral calf with some firmness underlying. No tenderness, warmth or erythema  (area of previous abscess I&D).   Neurological:     Mental Status: He is alert and oriented to person, place, and time.     Lab Results Lab Results  Component Value Date   WBC 5.8 04/20/2019   HGB 7.9 (L) 04/20/2019   HCT 25.4 (L) 04/20/2019   MCV 77.7 (L) 04/20/2019   PLT 497 (H) 04/20/2019    Lab Results  Component Value Date   CREATININE 0.71 04/20/2019   BUN 8 04/20/2019   NA 137 04/20/2019   K 3.8 04/20/2019   CL 103 04/20/2019   CO2 25 04/20/2019    Lab Results  Component Value Date   ALT 25 04/13/2019   AST 14 (L) 04/13/2019   ALKPHOS 84 04/13/2019   BILITOT 0.4 04/13/2019     Microbiology: Recent Results (from the past 240 hour(s))  Blood culture (routine x 2)     Status: Abnormal   Collection Time: 04/13/19 12:45 PM   Specimen: BLOOD  Result Value Ref Range Status   Specimen Description   Final    BLOOD RIGHT ANTECUBITAL Performed at [facility], [address] [place], [place] [address]    Special Requests   Final    BOTTLES DRAWN AEROBIC AND ANAEROBIC Blood Culture adequate volume Performed at [facility], [address] [place], [place] [address]    Culture  Setup Time   Final    GRAM POSITIVE COCCI IN CLUSTERS AEROBIC BOTTLE ONLY CRITICAL RESULT CALLED TO, READ BACK BY AND VERIFIED WITH: M. [place], AT [phone] 04/15/19 BY D. VANHOOK    Culture (A)  Final    STAPHYLOCOCCUS AUREUS SUSCEPTIBILITIES PERFORMED ON PREVIOUS CULTURE WITHIN THE LAST 5 DAYS. Performed at [facility], [place] [address], [place], [place] [address]    Report Status 04/16/2019 FINAL  Final  Blood Culture ID Panel (Reflexed)     Status: Abnormal   Collection Time: 04/13/19 12:45 PM  Result Value Ref Range Status   Enterococcus species NOT DETECTED NOT DETECTED Final   Listeria monocytogenes NOT DETECTED NOT DETECTED Final   Staphylococcus species DETECTED (A) NOT DETECTED Final    Comment:  CRITICAL RESULT CALLED TO, READ BACK BY AND VERIFIED WITH: [place], AT [phone] 04/15/19 BY D. VANHOOK    Staphylococcus aureus (BCID) DETECTED (A) NOT DETECTED Final    Comment: Methicillin (oxacillin)-resistant Staphylococcus aureus (MRSA). MRSA is predictably resistant to beta-lactam antibiotics (except ceftaroline). Preferred therapy is vancomycin unless clinically contraindicated. Patient requires contact precautions if  hospitalized. CRITICAL RESULT CALLED TO, READ BACK BY AND VERIFIED WITH: [place], AT [phone] 04/15/19 BY D. VANHOOK    Methicillin resistance DETECTED (A) NOT DETECTED Final    Comment: CRITICAL RESULT CALLED TO, READ BACK BY AND VERIFIED WITH: Hughie Closs PHARMD, AT [phone] 04/15/19 BY D. VANHOOK    Streptococcus species NOT DETECTED NOT DETECTED Final   Streptococcus agalactiae NOT DETECTED NOT DETECTED Final   Streptococcus pneumoniae NOT DETECTED NOT DETECTED Final   Streptococcus pyogenes NOT DETECTED NOT DETECTED Final   Acinetobacter baumannii NOT DETECTED NOT DETECTED Final   Enterobacteriaceae species NOT DETECTED NOT DETECTED Final   Enterobacter cloacae complex NOT DETECTED NOT DETECTED Final   Escherichia coli NOT DETECTED NOT DETECTED Final   Klebsiella oxytoca NOT DETECTED NOT DETECTED Final   Klebsiella pneumoniae NOT DETECTED NOT DETECTED Final   Proteus species NOT DETECTED NOT DETECTED Final   Serratia marcescens NOT DETECTED NOT DETECTED Final   Haemophilus influenzae NOT DETECTED NOT DETECTED Final   Neisseria meningitidis NOT DETECTED NOT DETECTED  Final   Pseudomonas aeruginosa NOT DETECTED NOT DETECTED Final   Candida albicans NOT DETECTED NOT DETECTED Final   Candida glabrata NOT DETECTED NOT DETECTED Final   Candida krusei NOT DETECTED NOT DETECTED Final   Candida parapsilosis NOT DETECTED NOT DETECTED Final   Candida tropicalis NOT DETECTED NOT DETECTED Final    Comment: Performed at [facility], [place]. [address] [place], [place] [address]  SARS Coronavirus 2 Ag (30 min TAT) - Nasal Swab (BD Veritor Kit)     Status: None   Collection Time: 04/13/19 12:52 PM   Specimen: Nasal Swab (BD Veritor Kit)  Result Value Ref Range Status   SARS Coronavirus 2 Ag NEGATIVE NEGATIVE Final    Comment: (NOTE) SARS-CoV-2 antigen NOT DETECTED.  Negative results are presumptive.  Negative results do not preclude SARS-CoV-2 infection and should not be used as the sole basis for treatment or other patient management decisions, including infection  control decisions, particularly in the presence of clinical signs and  symptoms consistent with COVID-19, or in those who have been in contact with the virus.  Negative results must be combined with clinical observations, patient history, and epidemiological information. The expected result is Negative. Fact Sheet for Patients: PodPark.tn Fact Sheet for Healthcare Providers: GiftContent.is This test is not yet approved or cleared by the [place] FDA and  has been authorized for detection and/or diagnosis of SARS-CoV-2 by FDA under an Emergency Use Authorization (EUA).  This EUA will remain in effect (meaning this test can be used) for the duration of  the COVID-19 de claration under Section 564(b)(1) of the Act, 21 U.S.C. section 360bbb-3(b)(1), unless the authorization is terminated or revoked sooner. Performed at [facility], [address] [place], [place] [address]   Blood culture (routine x 2)     Status:  Abnormal   Collection Time: 04/13/19 12:56 PM   Specimen: BLOOD  Result Value Ref Range Status   Specimen Description   Final    BLOOD LEFT ANTECUBITAL Performed at [facility], [address] [place], [place] [address]    Special Requests   Final    BOTTLES DRAWN AEROBIC AND ANAEROBIC Blood Culture adequate volume Performed at [facility], [address] [place], [place] [address]    Culture  Setup Time   Final    GRAM POSITIVE COCCI IN BOTH AEROBIC AND ANAEROBIC BOTTLES CRITICAL RESULT CALLED TO, READ BACK BY AND VERIFIED WITH: PHARND J Iran 123020 AT 950 AM BY CM Performed at [facility], [place] [address], [place], [place] [address]    Culture METHICILLIN RESISTANT STAPHYLOCOCCUS AUREUS (A)  Final   Report Status 04/16/2019 FINAL  Final   Organism ID, Bacteria METHICILLIN RESISTANT STAPHYLOCOCCUS AUREUS  Final      Susceptibility   Methicillin resistant staphylococcus aureus - MIC*    CIPROFLOXACIN >=8 RESISTANT Resistant     ERYTHROMYCIN >=8 RESISTANT Resistant     GENTAMICIN <=0.5 SENSITIVE Sensitive     OXACILLIN >=4 RESISTANT Resistant     TETRACYCLINE <=1 SENSITIVE Sensitive     VANCOMYCIN <=0.5 SENSITIVE Sensitive     TRIMETH/SULFA <=10 SENSITIVE Sensitive     CLINDAMYCIN >=8 RESISTANT Resistant     RIFAMPIN <=0.5 SENSITIVE Sensitive     Inducible Clindamycin NEGATIVE Sensitive     * METHICILLIN RESISTANT STAPHYLOCOCCUS AUREUS  SARS CORONAVIRUS 2 (TAT 6-24 HRS) Nasopharyngeal Nasopharyngeal Swab     Status: None   Collection Time: 04/13/19  3:31 PM   Specimen: Nasopharyngeal Swab  Result Value Ref Range Status   SARS Coronavirus 2 NEGATIVE NEGATIVE Final    Comment: (NOTE) SARS-CoV-2 target nucleic acids are NOT DETECTED. The SARS-CoV-2 RNA is generally detectable in upper and lower respiratory specimens during the acute phase of infection. Negative results do not preclude SARS-CoV-2 infection, do not rule out co-infections with  other pathogens, and should not be used as the sole basis for treatment or other patient management decisions. Negative results must be combined with clinical observations, patient history, and epidemiological information. The expected result is Negative. Fact Sheet for Patients: SugarRoll.be Fact Sheet for Healthcare Providers: https://www.woods-mathews.com/ This test is not yet approved or cleared by the [place] FDA and  has been authorized for detection and/or diagnosis of SARS-CoV-2 by FDA under an Emergency Use Authorization (EUA). This EUA will remain  in effect (meaning this test can be used) for the duration of the COVID-19 declaration under Section 56 4(b)(1) of the Act, 21 U.S.C. section 360bbb-3(b)(1), unless the authorization is terminated or revoked sooner. Performed at [facility], [place] [address] [place], [place] [address]   Urine culture     Status: None   Collection Time: 04/13/19  7:45 PM   Specimen: Urine, Random  Result Value Ref Range Status   Specimen Description   Final    URINE, RANDOM Performed at [facility], [address] [place], [place] [address]    Special Requests   Final    NONE Performed at [facility], [address] [place], [place] [address]    Culture   Final    NO GROWTH Performed at [facility], [place] [address] [place], [place] [address]    Report Status 04/14/2019 FINAL  Final  Surgical pcr screen     Status: Abnormal   Collection Time: 04/14/19  4:42 AM   Specimen: Nasal Mucosa; Nasal Swab  Result Value Ref Range Status   MRSA, PCR POSITIVE (A) NEGATIVE Final  Comment: RESULT CALLED TO, READ BACK BY AND VERIFIED WITH: C JOHNSON ON 123020 AT 1143 BY NFIELDS    Staphylococcus aureus POSITIVE (A) NEGATIVE Final    Comment: (NOTE) The Xpert SA Assay (FDA approved for NASAL specimens in patients 20 years of age and older), is one component of a  comprehensive surveillance program. It is not intended to diagnose infection nor to guide or monitor treatment. Performed at [facility], [place] [address], [place], [place] [address]   Culture, blood (routine x 2)     Status: Abnormal   Collection Time: 04/14/19 12:31 PM   Specimen: BLOOD RIGHT HAND  Result Value Ref Range Status   Specimen Description BLOOD RIGHT HAND  Final   Special Requests   Final    BOTTLES DRAWN AEROBIC AND ANAEROBIC Blood Culture adequate volume   Culture  Setup Time   Final    GRAM POSITIVE COCCI ANAEROBIC BOTTLE ONLY CRITICAL RESULT CALLED TO, READ BACK BY AND VERIFIED WITH: [place], PHARMD AT 1324 ON 04/18/19 BY C. JESSUP, MT. Performed at [facility], [place] [address], [place], [place] [address]    Culture METHICILLIN RESISTANT STAPHYLOCOCCUS AUREUS (A)  Final   Report Status 04/20/2019 FINAL  Final   Organism ID, Bacteria METHICILLIN RESISTANT STAPHYLOCOCCUS AUREUS  Final      Susceptibility   Methicillin resistant staphylococcus aureus - MIC*    CIPROFLOXACIN >=8 RESISTANT Resistant     ERYTHROMYCIN >=8 RESISTANT Resistant     GENTAMICIN <=0.5 SENSITIVE Sensitive     OXACILLIN >=4 RESISTANT Resistant     TETRACYCLINE <=1 SENSITIVE Sensitive     VANCOMYCIN <=0.5 SENSITIVE Sensitive     TRIMETH/SULFA <=10 SENSITIVE Sensitive     CLINDAMYCIN >=8 RESISTANT Resistant     RIFAMPIN <=0.5 SENSITIVE Sensitive     Inducible Clindamycin NEGATIVE Sensitive     * METHICILLIN RESISTANT STAPHYLOCOCCUS AUREUS  Culture, blood (routine x 2)     Status: None   Collection Time: 04/14/19 12:46 PM   Specimen: BLOOD LEFT HAND  Result Value Ref Range Status   Specimen Description BLOOD LEFT HAND  Final   Special Requests   Final    BOTTLES DRAWN AEROBIC ONLY Blood Culture results may not be optimal due to an inadequate volume of blood received in culture bottles   Culture   Final    NO GROWTH 5 DAYS Performed at [facility], [place] [address], [place], [place] [address]    Report Status 04/19/2019 FINAL  Final  Aerobic/Anaerobic Culture (surgical/deep wound)     Status: None   Collection Time: 04/15/19  1:45 PM   Specimen: A: PATH Bone biopsy   B: PATH Other; Abscess  Result Value Ref Range Status   Specimen Description BONE BIOPSY T9  Final   Special Requests NONE  Final   Gram Stain   Final    RARE WBC PRESENT, PREDOMINANTLY MONONUCLEAR RARE GRAM POSITIVE COCCI    Culture   Final    RARE METHICILLIN RESISTANT STAPHYLOCOCCUS AUREUS NO ANAEROBES ISOLATED Performed at [facility], [address], [place], [place] [address]    Report Status 04/20/2019 FINAL  Final   Organism ID, Bacteria METHICILLIN RESISTANT STAPHYLOCOCCUS AUREUS  Final      Susceptibility   Methicillin resistant staphylococcus aureus - MIC*    CIPROFLOXACIN >=8 RESISTANT Resistant     ERYTHROMYCIN >=8 RESISTANT Resistant     GENTAMICIN <=0.5 SENSITIVE Sensitive  OXACILLIN >=4 RESISTANT Resistant     TETRACYCLINE <=1 SENSITIVE Sensitive     VANCOMYCIN 2 SENSITIVE Sensitive     TRIMETH/SULFA <=10 SENSITIVE Sensitive     CLINDAMYCIN >=8 RESISTANT Resistant     RIFAMPIN <=0.5 SENSITIVE Sensitive     Inducible Clindamycin NEGATIVE Sensitive     * RARE METHICILLIN RESISTANT STAPHYLOCOCCUS AUREUS  Aerobic/Anaerobic Culture (surgical/deep wound)     Status: None   Collection Time: 04/15/19  1:53 PM   Specimen: Abscess; Tissue  Result Value Ref Range Status   Specimen Description ABSCESS T9 DISC ASPIRATION 2  Final   Special Requests Normal  Final   Gram Stain NO WBC SEEN RARE GRAM POSITIVE COCCI IN PAIRS   Final   Culture   Final    RARE METHICILLIN RESISTANT STAPHYLOCOCCUS AUREUS NO ANAEROBES ISOLATED Performed at [facility], [address] [place], [place] [address]    Report Status 04/20/2019 FINAL  Final   Organism ID, Bacteria METHICILLIN RESISTANT STAPHYLOCOCCUS AUREUS  Final      Susceptibility   Methicillin resistant  staphylococcus aureus - MIC*    CIPROFLOXACIN >=8 RESISTANT Resistant     ERYTHROMYCIN >=8 RESISTANT Resistant     GENTAMICIN <=0.5 SENSITIVE Sensitive     OXACILLIN >=4 RESISTANT Resistant     TETRACYCLINE <=1 SENSITIVE Sensitive     VANCOMYCIN <=0.5 SENSITIVE Sensitive     TRIMETH/SULFA <=10 SENSITIVE Sensitive     CLINDAMYCIN >=8 RESISTANT Resistant     RIFAMPIN <=0.5 SENSITIVE Sensitive     Inducible Clindamycin NEGATIVE Sensitive     * RARE METHICILLIN RESISTANT STAPHYLOCOCCUS AUREUS  Aerobic/Anaerobic Culture (surgical/deep wound)     Status: None   Collection Time: 04/15/19  1:53 PM   Specimen: Abscess  Result Value Ref Range Status   Specimen Description ABSCESS T9 DISC ASPIRATION  Final   Special Requests Normal  Final   Gram Stain NO WBC SEEN NO ORGANISMS SEEN   Final   Culture   Final    RARE METHICILLIN RESISTANT STAPHYLOCOCCUS AUREUS NO ANAEROBES ISOLATED Performed at [facility], [address], [place], [place] [address]    Report Status 04/20/2019 FINAL  Final   Organism ID, Bacteria METHICILLIN RESISTANT STAPHYLOCOCCUS AUREUS  Final      Susceptibility   Methicillin resistant staphylococcus aureus - MIC*    CIPROFLOXACIN >=8 RESISTANT Resistant     ERYTHROMYCIN >=8 RESISTANT Resistant     GENTAMICIN <=0.5 SENSITIVE Sensitive     OXACILLIN >=4 RESISTANT Resistant     TETRACYCLINE <=1 SENSITIVE Sensitive     VANCOMYCIN 1 SENSITIVE Sensitive     TRIMETH/SULFA <=10 SENSITIVE Sensitive     CLINDAMYCIN >=8 RESISTANT Resistant     RIFAMPIN <=0.5 SENSITIVE Sensitive     Inducible Clindamycin NEGATIVE Sensitive     * RARE METHICILLIN RESISTANT STAPHYLOCOCCUS AUREUS  Culture, blood (routine x 2)     Status: None (Preliminary result)   Collection Time: 04/19/19  3:06 PM   Specimen: BLOOD  Result Value Ref Range Status   Specimen Description BLOOD ASSIST CONTROL  Final   Special Requests   Final    BOTTLES DRAWN AEROBIC ONLY Blood Culture adequate volume    Culture   Final    NO GROWTH < 24 HOURS Performed at [facility], [address] [place], [place] [address]    Report Status PENDING  Incomplete  Culture, blood (routine x 2)     Status: None (Preliminary  result)   Collection Time: 04/19/19  3:06 PM   Specimen: BLOOD LEFT HAND  Result Value Ref Range Status   Specimen Description BLOOD LEFT HAND  Final   Special Requests   Final    BOTTLES DRAWN AEROBIC ONLY Blood Culture adequate volume   Culture   Final    NO GROWTH < 24 HOURS Performed at [facility], [address] [place], [place] [address]    Report Status PENDING  Incomplete    Janene Madeira, MSN, NP-C [facility]ious Disease [email or web address] Pager: [phone] Office: [phone] [place]: [phone]

## 2019-04-20 NOTE — Progress Notes (Signed)
Physical Therapy Treatment Patient Details Name: Thomas [name] MRN: [id number] DOB: 1976-04-02 Today's Date: 04/20/2019    History of Present Illness Patient is a 44 y/o male who presents with back pain, dizziness and fall at home. Recent long hospital stay at [facility] for disseminated MRSA bacteremia, osteomyelitis and discitis. Chest CT, abdomen and pelvis notable for findings of advanced osteomyelitis involving T9 as well as discitis T8-9, T9-10. s/p successful fluoro guided T9 vertebral body aspiration and biopsy 12/31. Positive blood cultures for MRSA. Also admitted for necrosis of right toes 1-5. Plan for TEE next week.    PT Comments    Patient received in bed, willing to participate this afternoon but continues to report high levels of neck and back pain; RN aware and administered pain medication during session. Able to complete bed mobility, functional transfers, and gait approximately 144f with RW and S, cues for technique and sequencing to assist in avoiding further flare of back pain. HR improved, only reached 116BPM with gait today. He was left up in the chair with all needs met, chair alarm active. Aware of upcoming surgery this week- will continue to follow acutely.   Follow Up Recommendations  Home health PT;Supervision - Intermittent     Equipment Recommendations  None recommended by PT    Recommendations for Other Services       Precautions / Restrictions Precautions Precautions: Fall Precaution Comments: watch HR Restrictions Weight Bearing Restrictions: No    Mobility  Bed Mobility Overal bed mobility: Needs Assistance Bed Mobility: Rolling;Sidelying to Sit Rolling: Supervision Sidelying to sit: HOB elevated;Supervision       General bed mobility comments: Cues for log roll technique, use of rail, slow and increased effort  Transfers Overall transfer level: Needs assistance Equipment used: Rolling walker (2 wheeled) Transfers: Sit to/from  Stand Sit to Stand: Supervision         General transfer comment: S for safety, cues for hand placement  Ambulation/Gait Ambulation/Gait assistance: Supervision Gait Distance (Feet): 160 Feet Assistive device: Rolling walker (2 wheeled) Gait Pattern/deviations: Step-through pattern;Decreased stride length Gait velocity: decreased   General Gait Details: slow but steady with RW, medicated by RN prior to walking however back pain had started to go back up at end of walk; HR up to 116BPM today   Stairs             Wheelchair Mobility    Modified Rankin (Stroke Patients Only)       Balance Overall balance assessment: Needs assistance;History of Falls Sitting-balance support: Feet supported;No upper extremity supported Sitting balance-Leahy Scale: Good     Standing balance support: During functional activity;Bilateral upper extremity supported Standing balance-Leahy Scale: Fair Standing balance comment: benefits from BUE support                            Cognition Arousal/Alertness: Awake/alert Behavior During Therapy: WFL for tasks assessed/performed Overall Cognitive Status: Within Functional Limits for tasks assessed                                        Exercises      General Comments General comments (skin integrity, edema, etc.): HR improved today, only up to 116BPM      Pertinent Vitals/Pain Pain Assessment: 0-10 Pain Score: 9  Pain Location: neck and back Pain Descriptors / Indicators: Sharp;Aching;Sore Pain Intervention(s):  Limited activity within patient's tolerance;Monitored during session;RN gave pain meds during session    Home Living                      Prior Function            PT Goals (current goals can now be found in the care plan section) Acute Rehab PT Goals Patient Stated Goal: to get better and go home PT Goal Formulation: With patient Time For Goal Achievement: 04/30/19 Potential to  Achieve Goals: Good Progress towards PT goals: Progressing toward goals    Frequency    Min 3X/week      PT Plan Current plan remains appropriate    Co-evaluation PT/OT/SLP Co-Evaluation/Treatment: Yes Reason for Co-Treatment: To address functional/ADL transfers;Other (comment)(high pain levels)   OT goals addressed during session: ADL's and self-care;Proper use of Adaptive equipment and DME      AM-PAC PT "6 Clicks" Mobility   Outcome Measure  Help needed turning from your back to your side while in a flat bed without using bedrails?: A Little Help needed moving from lying on your back to sitting on the side of a flat bed without using bedrails?: A Little Help needed moving to and from a bed to a chair (including a wheelchair)?: A Little Help needed standing up from a chair using your arms (e.g., wheelchair or bedside chair)?: A Little Help needed to walk in hospital room?: A Little Help needed climbing 3-5 steps with a railing? : A Little 6 Click Score: 18    End of Session   Activity Tolerance: Patient tolerated treatment well Patient left: in chair;with call bell/phone within reach;with chair alarm set   PT Visit Diagnosis: Pain;Muscle weakness (generalized) (M62.81);Unsteadiness on feet (R26.81) Pain - Right/Left: (bilateral) Pain - part of body: Leg(back)     Time: 9326-7124 PT Time Calculation (min) (ACUTE ONLY): 35 min  Charges:  $Gait Training: 8-22 mins                     Windell Norfolk, DPT, PN1   Supplemental Physical Therapist [place]    Pager [phone] Acute Rehab Office [phone]

## 2019-04-20 NOTE — Progress Notes (Addendum)
Occupational Therapy Treatment Patient Details Name: Thomas [name] MRN: [id number] DOB: 09-27-1975 Today's Date: 04/20/2019    History of present illness Patient is a 44 y/o male who presents with back pain, dizziness and fall at home. Recent long hospital stay at [facility] for disseminated MRSA bacteremia, osteomyelitis and discitis. Chest CT, abdomen and pelvis notable for findings of advanced osteomyelitis involving T9 as well as discitis T8-9, T9-10. s/p successful fluoro guided T9 vertebral body aspiration and biopsy 12/31. Positive blood cultures for MRSA. Also admitted for necrosis of right toes 1-5. Plan for TEE next week.   OT comments  Pt limited by pain reporting 9/10 in back and neck upon arrival. RN notified and gave pain meds. Pt sat EOB with sup, increased time. Sit - stand from EOB to RW with sup. Pt tolerated functional mobility with RW and transferred to recliner with sup. Pt's LB selfcare  limited/hindered due to back pain. May benefit from A/E education next session. Pt to OR Wednesday or Thursday for R foot vascularization. Pt HR 116 during mobility/activity. OT will continue to follow acutely  Follow Up Recommendations  Home health OT;Supervision/Assistance - 24 hour    Equipment Recommendations  Other (comment);3 in 1 bedside commode(ADL A/E kit)    Recommendations for Other Services      Precautions / Restrictions Precautions Precautions: Fall Precaution Comments: watch HR Restrictions Weight Bearing Restrictions: No       Mobility Bed Mobility Overal bed mobility: Needs Assistance Bed Mobility: Rolling;Sidelying to Sit              Transfers Overall transfer level: Needs assistance Equipment used: Rolling walker (2 wheeled) Transfers: Sit to/from Stand Sit to Stand: Supervision              Balance Overall balance assessment: Needs assistance;History of Falls Sitting-balance support: Feet supported;No upper extremity supported        Standing balance support: During functional activity;Bilateral upper extremity supported Standing balance-Leahy Scale: Fair                             ADL either performed or assessed with clinical judgement   ADL Overall ADL's : Needs assistance/impaired                         Toilet Transfer: Supervision/safety;Ambulation;RW Toilet Transfer Details (indicate cue type and reason): simulated to recliner         Functional mobility during ADLs: Supervision/safety;Rolling walker General ADL Comments: Pt with 9/10 back pain. RN gave IV pain meds during session while pt still in bed. LB selfcare will be a limited/hindered due to back pain. May benefit from A/E education next session     Vision Patient Visual Report: No change from baseline     Perception     Praxis      Cognition Arousal/Alertness: Awake/alert Behavior During Therapy: WFL for tasks assessed/performed Overall Cognitive Status: Within Functional Limits for tasks assessed                                          Exercises     Shoulder Instructions       General Comments      Pertinent Vitals/ Pain       Pain Assessment: 0-10 Pain Score: 9  Pain Descriptors /  Indicators: Constant;Discomfort;Aching Pain Intervention(s): Limited activity within patient's tolerance;Monitored during session;Repositioned;Premedicated before session  Home Living                                          Prior Functioning/Environment              Frequency  Min 2X/week        Progress Toward Goals  OT Goals(current goals can now be found in the care plan section)  Progress towards OT goals: OT to reassess next treatment  Acute Rehab OT Goals Patient Stated Goal: to get better and go home  Plan Discharge plan remains appropriate    Co-evaluation    PT/OT/SLP Co-Evaluation/Treatment: Yes Reason for Co-Treatment: For patient/therapist  safety;Other (comment)(pt limited by pain)   OT goals addressed during session: ADL's and self-care;Proper use of Adaptive equipment and DME      AM-PAC OT "6 Clicks" Daily Activity     Outcome Measure   Help from another person eating meals?: None Help from another person taking care of personal grooming?: None Help from another person toileting, which includes using toliet, bedpan, or urinal?: A Little Help from another person bathing (including washing, rinsing, drying)?: A Lot Help from another person to put on and taking off regular upper body clothing?: None Help from another person to put on and taking off regular lower body clothing?: Total 6 Click Score: 18    End of Session Equipment Utilized During Treatment: Rolling walker  OT Visit Diagnosis: Unsteadiness on feet (R26.81);Other abnormalities of gait and mobility (R26.89);Muscle weakness (generalized) (M62.81)   Activity Tolerance Patient limited by pain   Patient Left in chair;with call bell/phone within reach;with chair alarm set   Nurse Communication          Time: [phone] OT Time Calculation (min): 35 min  Charges: OT General Charges $OT Visit: 1 Visit OT Treatments $Therapeutic Activity: 8-22 mins     Britt Bottom 04/20/2019, 2:38 PM

## 2019-04-20 NOTE — Plan of Care (Signed)
  Problem: Nutrition: Goal: Adequate nutrition will be maintained Outcome: Progressing   Problem: Pain Managment: Goal: General experience of comfort will improve Outcome: Progressing   

## 2019-04-21 ENCOUNTER — Inpatient Hospital Stay: Payer: Self-pay

## 2019-04-21 ENCOUNTER — Inpatient Hospital Stay (HOSPITAL_COMMUNITY): Payer: Medicaid Other

## 2019-04-21 DIAGNOSIS — B9562 Methicillin resistant Staphylococcus aureus infection as the cause of diseases classified elsewhere: Secondary | ICD-10-CM

## 2019-04-21 DIAGNOSIS — Z8739 Personal history of other diseases of the musculoskeletal system and connective tissue: Secondary | ICD-10-CM

## 2019-04-21 LAB — CBC WITH DIFFERENTIAL/PLATELET
Abs Immature Granulocytes: 0.02 10*3/uL (ref 0.00–0.07)
Basophils Absolute: 0 10*3/uL (ref 0.0–0.1)
Basophils Relative: 0 %
Eosinophils Absolute: 0.1 10*3/uL (ref 0.0–0.5)
Eosinophils Relative: 2 %
HCT: 26.4 % — ABNORMAL LOW (ref 39.0–52.0)
Hemoglobin: 8.1 g/dL — ABNORMAL LOW (ref 13.0–17.0)
Immature Granulocytes: 0 %
Lymphocytes Relative: 27 %
Lymphs Abs: 1.5 10*3/uL (ref 0.7–4.0)
MCH: 23.8 pg — ABNORMAL LOW (ref 26.0–34.0)
MCHC: 30.7 g/dL (ref 30.0–36.0)
MCV: 77.6 fL — ABNORMAL LOW (ref 80.0–100.0)
Monocytes Absolute: 0.6 10*3/uL (ref 0.1–1.0)
Monocytes Relative: 10 %
Neutro Abs: 3.6 10*3/uL (ref 1.7–7.7)
Neutrophils Relative %: 61 %
Platelets: 531 10*3/uL — ABNORMAL HIGH (ref 150–400)
RBC: 3.4 MIL/uL — ABNORMAL LOW (ref 4.22–5.81)
RDW: 14.7 % (ref 11.5–15.5)
WBC: 5.8 10*3/uL (ref 4.0–10.5)
nRBC: 0 % (ref 0.0–0.2)

## 2019-04-21 LAB — BASIC METABOLIC PANEL
Anion gap: 11 (ref 5–15)
BUN: <5 mg/dL — ABNORMAL LOW (ref 6–20)
CO2: 23 mmol/L (ref 22–32)
Calcium: 9.1 mg/dL (ref 8.9–10.3)
Chloride: 105 mmol/L (ref 98–111)
Creatinine, Ser: 0.67 mg/dL (ref 0.61–1.24)
GFR calc Af Amer: >60 mL/min (ref >60)
GFR calc non Af Amer: >60 mL/min (ref >60)
Glucose, Bld: 120 mg/dL — ABNORMAL HIGH (ref 70–99)
Potassium: 3.8 mmol/L (ref 3.5–5.1)
Sodium: 139 mmol/L (ref 135–145)

## 2019-04-21 LAB — GLUCOSE, CAPILLARY
Glucose-Capillary: 115 mg/dL — ABNORMAL HIGH (ref 70–99)
Glucose-Capillary: 139 mg/dL — ABNORMAL HIGH (ref 70–99)
Glucose-Capillary: 109 mg/dL — ABNORMAL HIGH (ref 70–99)
Glucose-Capillary: 107 mg/dL — ABNORMAL HIGH (ref 70–99)

## 2019-04-21 LAB — VANCOMYCIN, TROUGH: Vancomycin Tr: 15 ug/mL (ref 15–20)

## 2019-04-21 IMAGING — DX DG CHEST 1V PORT
1 series · 1 of 1 positions shown · non-contrast
Comparison: Chest CTA [DATE]

CLINICAL DATA: Poorly controlled diabetes, recent hospitalization
for disseminated MRSA bacteremia, discitis and osteomyelitis

EXAM:
PORTABLE CHEST 1 VIEW

[chest ap]
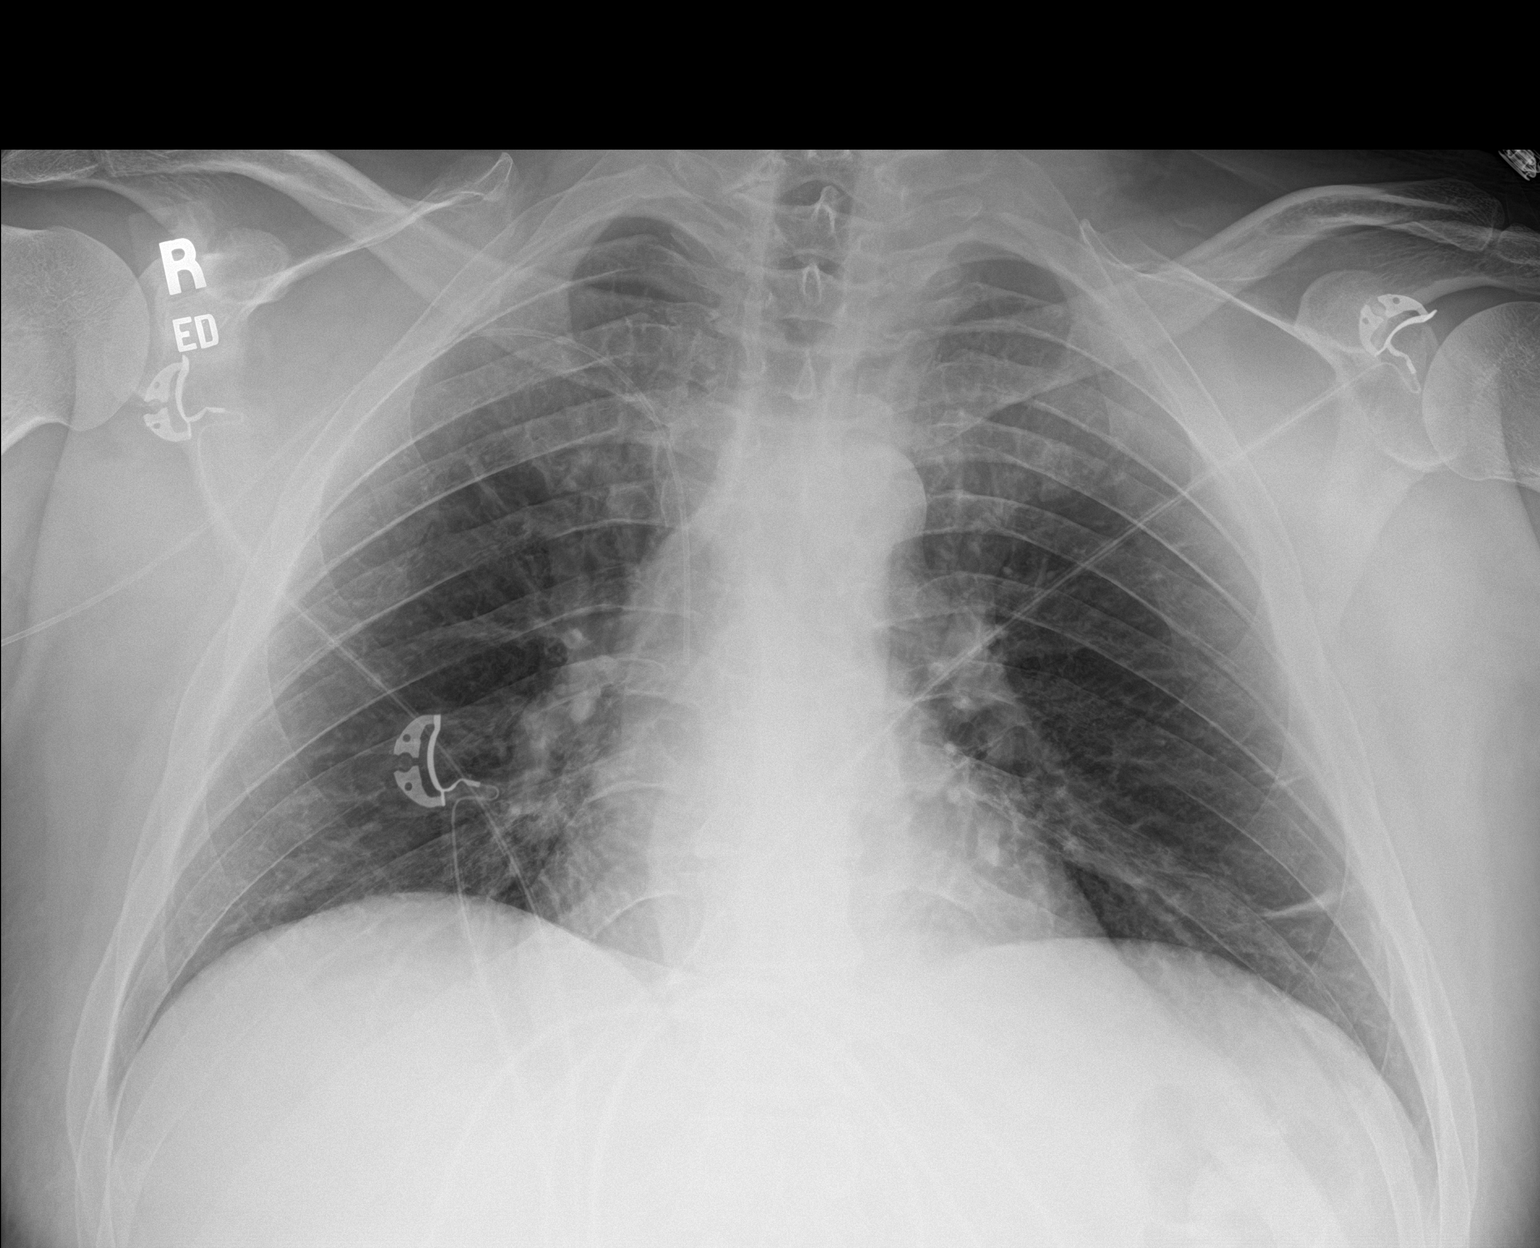

[1 of 1 positions shown; findings below may reference images not displayed]

FINDINGS: A right upper extremity PICC terminates at the superior cavoatrial
junction. Telemetry leads overlie the chest. Lung volumes are
diminished with some streaky bandlike areas of opacity in the lungs
favoring atelectasis. No consolidative opacity. No pneumothorax or
visible effusion. The cardiomediastinal contours are unremarkable.
Degenerative changes are present in the imaged spine and shoulders.
Changes of T8-T10 discitis/osteomyelitis are not well visualized in
the absence of lateral radiography.
IMPRESSION: 1. Low lung volumes with streaky bandlike areas of opacity in the
lungs favoring atelectasis.
2. Changes of known T8-T10 discitis/osteomyelitis are not well
visualized in the absence of lateral radiography.

## 2019-04-21 MED ORDER — SODIUM CHLORIDE 0.9% FLUSH: 10.0000 mL | INTRAVENOUS | Status: DC | PRN

## 2019-04-21 MED ORDER — SODIUM CHLORIDE 0.9% FLUSH
10.0000 mL | Freq: Two times a day (BID) | INTRAVENOUS | Status: DC
  Administered 2019-04-23 – 2019-04-27 (×6): 10 mL
  Administered 2019-04-28: 20 mL

## 2019-04-21 MED ORDER — CHLORHEXIDINE GLUCONATE CLOTH 2 % EX PADS
6.0000 | MEDICATED_PAD | Freq: Every day | CUTANEOUS | Status: DC
  Administered 2019-04-22 – 2019-05-03 (×10): 6 via TOPICAL

## 2019-04-21 MED ORDER — CEFAZOLIN SODIUM-DEXTROSE 2-4 GM/100ML-% IV SOLN
2.0000 g | INTRAVENOUS | Status: AC
  Administered 2019-04-22: 08:00:00 2 g via INTRAVENOUS
  Filled 2019-04-21: qty 100

## 2019-04-21 NOTE — Progress Notes (Signed)
Pharmacy Antibiotic Note  Thomas [name] is a 44 y.o. male admitted on 04/13/2019 with MRSA bacteremia/discitis. Pharmacy has been consulted for vancomycin dosing.  WBC WNL and patient afebrile. Scr is stable at 0.67. Levels drawn last night and this morning showed a Vancomycin Peak of 24- Drawn slightly late - True peak likely closer to 28-30. Vancomycin trough is 15. Predicted AUC 503, which is therapeutic.   Plan: Continue Vancomycin at 1000 mg Q 12 hours  Goal AUC 400-550. Monitor renal function and vancomycin levels at least weekly F/U vascular surgery plans.   Height: 5\' 10"  (177.8 cm) Weight: 192 lb 11.2 oz (87.4 kg) IBW/kg (Calculated) : 73  Temp (24hrs), Avg:99.1 F (37.3 C), Min:99.1 F (37.3 C), Max:99.1 F (37.3 C)  Recent Labs  Lab 04/17/19 0335 04/17/19 2009 04/18/19 0537 04/19/19 0612 04/20/19 0302 04/20/19 2103 04/21/19 0533  WBC 7.4  --  6.7 6.6 5.8  --  5.8  CREATININE 0.86  --  0.71 0.73 0.71  --  0.67  VANCOTROUGH  --   --  18  --   --   --  15  VANCOPEAK  --  42*  --   --   --  24*  --     Estimated Creatinine Clearance: 124.2 mL/min (by C-G formula based on SCr of 0.67 mg/dL).    No Known Allergies  Antimicrobials this admission: Vancomycin 12/29>> Cefepime 12/29>>12/30 Metronidazole 12/29 x1  Dose adjustments this admission: 04/18/19 Vancomycin 1500 mg q12 >> vancomycin 1000 mg q12  Microbiology results: 12/29 COVID: Negative 12/29 Bcx>>MRSA 1/2 12/29 Bcx>>  MRSA 2/2 12/29 Ucx: Neg 12/30 MRSA PCR: Positive 12/30 Bcx: NGTD x 3 12/31 Would culture (bone biopsy):MRSA 1/4 repeat blood cultures: NGTD    Thank you for allowing pharmacy to be a part of this patient's care.   1/32, PharmD, BCPS, BCIDP Infectious Diseases Clinical Pharmacist Phone: [phone] Please check AMION for all [facility] phone numbers After 10:00 PM, call Main Pharmacy [phone] 04/21/2019 8:35 AM

## 2019-04-21 NOTE — Progress Notes (Signed)
Physical Therapy Treatment Patient Details Name: Thomas [name] MRN: [id number] DOB: 1975-05-31 Today's Date: 04/21/2019    History of Present Illness Patient is a 44 y/o male who presents with back pain, dizziness and fall at home. Recent long hospital stay at [facility] for disseminated MRSA bacteremia, osteomyelitis and discitis. Chest CT, abdomen and pelvis notable for findings of advanced osteomyelitis involving T9 as well as discitis T8-9, T9-10. s/p successful fluoro guided T9 vertebral body aspiration and biopsy 12/31. Positive blood cultures for MRSA. Also admitted for necrosis of right toes 1-5. Plan for TEE next week.    PT Comments    Patient received in bed, pleasant and jovial today, reports pain medication is helping and premedicated before session, eager to walk. Able to perform bed mobility, functional transfers, and gait approximately 236f with RW and S. HR to 118BPM with gait today. He was left in bed per his request (had just gotten back to bed from being up in chair for a good part of the morning) with all needs met. Will continue to follow acutely.     Follow Up Recommendations  Home health PT;Supervision - Intermittent     Equipment Recommendations  None recommended by PT    Recommendations for Other Services       Precautions / Restrictions Precautions Precautions: Fall Precaution Comments: watch HR Restrictions Weight Bearing Restrictions: No    Mobility  Bed Mobility Overal bed mobility: Needs Assistance Bed Mobility: Rolling;Sidelying to Sit Rolling: Supervision Sidelying to sit: Supervision;HOB elevated       General bed mobility comments: Cues for log roll technique, use of rail, slow and increased effort  Transfers Overall transfer level: Needs assistance Equipment used: Rolling walker (2 wheeled) Transfers: Sit to/from Stand Sit to Stand: Supervision         General transfer comment: S for safety, cues for hand  placement  Ambulation/Gait   Gait Distance (Feet): 200 Feet Assistive device: Rolling walker (2 wheeled) Gait Pattern/deviations: Step-through pattern;Decreased stride length Gait velocity: decreased   General Gait Details: gait speed and tolerance improved today; chair follow for safety due to irritability of back pain but not needed. HR to 118BPM today.   Stairs             Wheelchair Mobility    Modified Rankin (Stroke Patients Only)       Balance Overall balance assessment: Needs assistance;History of Falls Sitting-balance support: Feet supported;No upper extremity supported Sitting balance-Leahy Scale: Good     Standing balance support: During functional activity;Bilateral upper extremity supported Standing balance-Leahy Scale: Fair Standing balance comment: benefits from BUE support                            Cognition Arousal/Alertness: Awake/alert Behavior During Therapy: WFL for tasks assessed/performed Overall Cognitive Status: Within Functional Limits for tasks assessed                                        Exercises      General Comments General comments (skin integrity, edema, etc.): HR to 118BPM with mobility      Pertinent Vitals/Pain Pain Assessment: 0-10 Pain Score: 8  Pain Location: neck and back Pain Descriptors / Indicators: Sharp;Aching;Sore Pain Intervention(s): Limited activity within patient's tolerance;Monitored during session;Premedicated before session    Home Living  Prior Function            PT Goals (current goals can now be found in the care plan section) Acute Rehab PT Goals Patient Stated Goal: to get better and go home PT Goal Formulation: With patient Time For Goal Achievement: 04/30/19 Potential to Achieve Goals: Good Progress towards PT goals: Progressing toward goals    Frequency    Min 3X/week      PT Plan Current plan remains appropriate     Co-evaluation              AM-PAC PT "6 Clicks" Mobility   Outcome Measure  Help needed turning from your back to your side while in a flat bed without using bedrails?: A Little Help needed moving from lying on your back to sitting on the side of a flat bed without using bedrails?: A Little Help needed moving to and from a bed to a chair (including a wheelchair)?: A Little Help needed standing up from a chair using your arms (e.g., wheelchair or bedside chair)?: A Little Help needed to walk in hospital room?: A Little Help needed climbing 3-5 steps with a railing? : A Little 6 Click Score: 18    End of Session   Activity Tolerance: Patient tolerated treatment well Patient left: in bed;with call bell/phone within reach Nurse Communication: Mobility status PT Visit Diagnosis: Pain;Muscle weakness (generalized) (M62.81);Unsteadiness on feet (R26.81) Pain - Right/Left: (bilateral) Pain - part of body: Leg(and back)     Time: 9584-4171 PT Time Calculation (min) (ACUTE ONLY): 10 min  Charges:  $Gait Training: 8-22 mins                     Windell Norfolk, DPT, PN1   Supplemental Physical Therapist [place]    Pager [phone] Acute Rehab Office [phone]

## 2019-04-21 NOTE — Progress Notes (Signed)
PROGRESS NOTE  Thomas [name] ZOX:[id number] DOB: Nov 21, 1975 DOA: 04/13/2019 PCP: Mackie Pai, PA-C  Brief History   Deeric Thomas [name] a 44 y.o.malewith medical history significant ofDM2 poorly controlled.  Recent hospitalization at [facility] for disseminated MRSA bacteremia, osteomyelitis and discitis.  Completed 6 weeks course of IV antibiotics on April 04, 2019.  Source of bacteremia thought to be from skin source (per previous facility report, area on toe that appeared cellulitic, area in R back that was erythematous and an abrasion of the lateral R aspect of his shin, abscess formation in the R calf and pyomyositis of the L vastus intermedius).  Developed necrosis of his R 1-5 digits since his discharge.    Presented here with worsening back pain, generalized weakness, fever and recent fall.  CT chest, abdomen and pelvis notable for findings of advanced osteomyelitis involving T9 as well as discitis.  Positive blood cultures for MRSA.  Seen by infectious disease, recommendations for IV Vancomycin.  Planned TEE on Monday.  Vascular surgery following for right 1st-5th toe dry gangrene. However, he wants to wait until current infection in controlled before proceeding with aortogram etc to address PAD and dry gangrene.  Pt underwent TEE today which did not demonstrate vegetations or other evidence of persistent endocarditis.   He was also taken to the vascular lab by Dr. Carlis Abbott for Aortogram, bilateral lower extremity arteriogram with runoff, right external iliac artery angioplasty with stent placement under moderate conscious sedation. This was performed critical ischemia of the right lower extremity with gangrene of digits 1-5 of the right foot. Dr. Carlis Abbott also plans on right SFA to TP trunk bypass for popliteal occlusion in addition to iliac lesion. This is planned fo 04/22/2019.  Consultants  . Vascular surgery . Interventional radiology . Infectious disease . Wound Care   Procedures  . Aspiration of T9 to guide further therapy . Angiogram of bilateral lower extremities with stent placement in Rt EIA 04/19/2019.  Antibiotics   Anti-infectives (From admission, onward)   Start     Dose/Rate Route Frequency Ordered Stop   04/22/19 0600  ceFAZolin (ANCEF) IVPB 2g/100 mL premix    Note to Pharmacy: Send with pt to OR   2 g 200 mL/hr over 30 Minutes Intravenous To Short Stay 04/21/19 0743 04/23/19 0600   04/18/19 1800  vancomycin (VANCOCIN) IVPB 1000 mg/200 mL premix     1,000 mg 200 mL/hr over 60 Minutes Intravenous Every 12 hours 04/18/19 0718     04/14/19 1600  vancomycin (VANCOREADY) IVPB 1500 mg/300 mL  Status:  Discontinued     1,500 mg 150 mL/hr over 120 Minutes Intravenous Every 12 hours 04/14/19 1240 04/18/19 0718   04/13/19 2200  ceFEPIme (MAXIPIME) 2 g in sodium chloride 0.9 % 100 mL IVPB  Status:  Discontinued     2 g 200 mL/hr over 30 Minutes Intravenous Every 8 hours 04/13/19 1428 04/14/19 1028   04/13/19 1430  vancomycin (VANCOCIN) IVPB 1000 mg/200 mL premix  Status:  Discontinued     1,000 mg 200 mL/hr over 60 Minutes Intravenous Every 12 hours 04/13/19 1428 04/14/19 1240   04/13/19 1415  ceFEPIme (MAXIPIME) 2 g in sodium chloride 0.9 % 100 mL IVPB     2 g 200 mL/hr over 30 Minutes Intravenous  Once 04/13/19 1411 04/13/19 1505   04/13/19 1415  metroNIDAZOLE (FLAGYL) IVPB 500 mg     500 mg 100 mL/hr over 60 Minutes Intravenous  Once 04/13/19 1411 04/13/19 1600   04/13/19 1415  vancomycin (VANCOCIN) IVPB 1000 mg/200 mL premix  Status:  Discontinued     1,000 mg 200 mL/hr over 60 Minutes Intravenous  Once 04/13/19 1411 04/13/19 1428      Subjective  The patient states that his right foot is less painful today.  Objective   Vitals:  Vitals:   04/21/19 1500 04/21/19 1508  BP: (!) 145/102   Pulse: (!) 102   Resp: (!) 23   Temp:  97.8 F (36.6 C)  SpO2:     Exam:  Constitutional:  . The patient is awake, alert, and oriented x 3.  No acute distress. Respiratory:  . No increased work of breathing. . No wheezes, rales, or rhonchi . No tactile fremitus Cardiovascular:  . Regular rate and rhythm . No murmurs, ectopy, or gallups. . No lateral PMI. No thrills. Abdomen:  . Abdomen is soft, non-tender, non-distended . No hernias, masses, or organomegaly . Normoactive bowel sounds.  Musculoskeletal:  . No cyanosis or clubbing . 2-3+ pitting edema of lower extremities bilaterally . All 5 digits on right foot are blackened with dry gangrene. Skin:  . No rashes, lesions, ulcers . palpation of skin: no induration or nodules . Blackening of distal aspect of 5 digits on right foot. Neurologic:  . CN 2-12 intact . Sensation all 4 extremities intact Psychiatric:  . Mental status o Mood, affect appropriate o Orientation to person, place, time  . judgment and insight appear intact  I have personally reviewed the following:   Today's Data  . Vitals, CBC, BMP  Micro Data  . No growth of surveillance cultures.  Scheduled Meds: . atorvastatin  10 mg Oral q1800  . clopidogrel  75 mg Oral Q breakfast  . insulin aspart  0-9 Units Subcutaneous TID WC  . insulin glargine  15 Units Subcutaneous QHS  . pneumococcal 23 valent vaccine  0.5 mL Intramuscular Tomorrow-1000  . saccharomyces boulardii  250 mg Oral BID  . senna-docusate  2 tablet Oral BID  . sodium chloride flush  3 mL Intravenous Once  . sodium chloride flush  3 mL Intravenous Q12H   Continuous Infusions: . sodium chloride 250 mL (04/13/19 2351)  . sodium chloride Stopped (04/13/19 2230)  . sodium chloride    . [START ON 04/22/2019]  ceFAZolin (ANCEF) IV    . lactated ringers 100 mL/hr at 04/21/19 0400  . 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 Infectious disease has been consulted, recommendation for IV vancomycin for now. Repeated blood cultures on 04/14/2019 - to date. Continue to follow cultures. MRI independently reviewed showed progressive T9 osteomyelitis with increased cultures evaluation and vertebral body collapse.  New discitis at T8-9 and T9-T10 with T8 and T10 osteomyelitis.  Moderate to severe spinal stenosis at T8-T9. Persistent extensive anterior paravertebral soft tissue inflammation/phlegmon. Decreased posterior paraspinal inflammation without residual or new abscess. Cardiology has been consulted for TEE to assess for endocarditis. This was performed on 04/18/2018 and demonstrated no vegetations or  evidence of persistent endocarditis. Pain management in place. Continue bowel regimen.  MRSA bacteremia: Management as stated above. Currently afebrile with no leukocytosis. Continue to monitor WBC and fever curve.  T9 abscess status post aspiration by interventional radiology on 04/15/2019: Deep tissue culture growing staph aureus, continue to monitor cultures. Continue pain management and IV antibiotics.  Recurrent thoracic vertebral osteomyelitis: Previously treated with 6 weeks of IV antibiotics with IV daptomycin on 04/04/2019. Significantly elevated CRP 19.4 and sed rate >140. Management as per above.  Peripheral artery disease: Right first through fifth toes dry gangrene: Vascular surgery following with possible intervention on Monday with Dr. Chestine Spore. He wishes to wait to ensure clearance of bacteremia prior to procedure in case a stent is needed. The patient was quite upset upon hearing this. He believed that his toes were only "dry". The patient was taken to  the vascular lab by Dr. Chestine Spore today where he underwent bilateral arteriogram with bilateral runoff and stent placement in Rt EIA. Plans are for the patient to return to vascular lab on 04/22/2019 for  right SFA to TP trunk bypass for popliteal occlusion in addition to iliac lesion.   History of DVT: On Eliquis which is on hold due to possible procedure, held since 04/14/2019.  Possible rectal mass: Monitor. May require GI evaluation when more stable and bacteremia has cleared.  Type 2 diabetes with hyperglycemia: Hemoglobin A1c 8.3 on 04/14/2019. Continue insulin coverage.  Essential hypertension: Continue to hold off antihypertensives for now due to soft blood pressure.  Hyperlipidemia: Continue Robaxin.  Diabetic polyneuropathy: Continue gabapentin  I have seen and examined this patient myself. I have spent 34 minutes in his evaluation and care.  DVT prophylaxis: SCDs. Code Status:Full Family Communication: None at bedside. Disposition Plan: Home when hemodynamically stable, vascular surgery and infectious disease have signed off.    Weiland Tomich, DO [facility] Direct contact: see [email or web address]  7PM-7AM contact night coverage as above 04/21/2019, 5:48 PM  LOS: 4 days

## 2019-04-21 NOTE — Progress Notes (Addendum)
  Progress Note    04/21/2019 7:29 AM 2 Days Post-Op  Subjective:  No complaints; says his foot is okay when asked and wiggles all toes  Tm 99.1  Vitals:   04/20/19 2123 04/21/19 0558  BP:  (!) 144/91  Pulse: 96 88  Resp: (!) 25 (!) 1  Temp:  99.1 F (37.3 C)  SpO2: 100% 100%    Physical Exam: General:  No distress; sitting up in chair.  Lungs:  Non labored Incisions:  Left groin is soft without hematoma Extremities:  Right foot with motor in tact; gangrenous toes right foot    CBC    Component Value Date/Time   WBC 5.8 04/21/2019 0533   RBC 3.40 (L) 04/21/2019 0533   HGB 8.1 (L) 04/21/2019 0533   HCT 26.4 (L) 04/21/2019 0533   PLT 531 (H) 04/21/2019 0533   MCV 77.6 (L) 04/21/2019 0533   MCH 23.8 (L) 04/21/2019 0533   MCHC 30.7 04/21/2019 0533   RDW 14.7 04/21/2019 0533   LYMPHSABS 1.5 04/21/2019 0533   MONOABS 0.6 04/21/2019 0533   EOSABS 0.1 04/21/2019 0533   BASOSABS 0.0 04/21/2019 0533    BMET    Component Value Date/Time   NA 139 04/21/2019 0533   K 3.8 04/21/2019 0533   CL 105 04/21/2019 0533   CO2 23 04/21/2019 0533   GLUCOSE 120 (H) 04/21/2019 0533   BUN <5 (L) 04/21/2019 0533   CREATININE 0.67 04/21/2019 0533   CREATININE 0.98 11/14/2016 1352   CALCIUM 9.1 04/21/2019 0533   GFRNONAA >60 04/21/2019 0533   GFRNONAA >89 11/14/2016 1352   GFRAA >60 04/21/2019 0533   GFRAA >89 11/14/2016 1352    INR    Component Value Date/Time   INR 1.3 (H) 04/13/2019 1247     Intake/Output Summary (Last 24 hours) at 04/21/2019 0729 Last data filed at 04/21/2019 0600 Gross per 24 hour  Intake 1282.46 ml  Output 1400 ml  Net -117.54 ml     Assessment:  44 y.o. male is s/p:  R EIA stent   2 Days Post-Op  Plan: -pt doing well this am and ready for surgery tomorrow.  He thought his surgery was today.  Questions answered about surgery.  He is ready -npo after MN     Doreatha Massed, PA-C [facility] [phone] 04/21/2019 7:29 AM   I have seen and evaluated the patient. I agree with the PA note as documented above. POD#2 s/p R EIA stent. Plan for right SFA to TP trunk bypass tomorrow for CLI with tissue loss.  Please keep NPO after midnight.  Consent order placed in chart.  [name], MD [facility] [place] Office: [phone]

## 2019-04-21 NOTE — Progress Notes (Signed)
Peripherally Inserted Central Catheter/Midline Placement  The IV Nurse has discussed with the patient and/or persons authorized to consent for the patient, the purpose of this procedure and the potential benefits and risks involved with this procedure.  The benefits include less needle sticks, lab draws from the catheter, and the patient may be discharged home with the catheter. Risks include, but not limited to, infection, bleeding, blood clot (thrombus formation), and puncture of an artery; nerve damage and irregular heartbeat and possibility to perform a PICC exchange if needed/ordered by physician.  Alternatives to this procedure were also discussed.  Bard Power PICC patient education guide, fact sheet on infection prevention and patient information card has been provided to patient /or left at bedside.    PICC/Midline Placement Documentation  PICC Single Lumen 04/21/19 PICC Right Brachial 37 cm 0 cm (Active)  Indication for Insertion or Continuance of Line Home intravenous therapies (PICC only) 04/21/19 2213  Exposed Catheter (cm) 0 cm 04/21/19 2213  Site Assessment Clean;Intact;Dry 04/21/19 2213  Line Status Flushed;Saline locked;Blood return noted 04/21/19 2213  Dressing Type Transparent 04/21/19 2213  Dressing Status Clean;Dry;Intact;Antimicrobial disc in place 04/21/19 2213  Dressing Change Due 04/28/19 04/21/19 2213       Ethelda Chick 04/21/2019, 10:14 PM

## 2019-04-21 NOTE — Progress Notes (Signed)
       [facility]  Date of Admission:  04/13/2019           Day 9 vancomycin ASSESSMENT: He has a very severe, persistent/relapsing MRSA infection.  He failed vancomycin therapy early in his recent hospitalization in [place].  We will need to consider any and all ways of boosting and extending his therapy in an attempt to achieve cure.  PLAN: 1. Continue vancomycin for now 2. PICC placement  Principal Problem:   MRSA bacteremia Active Problems:   Osteomyelitis of spine (HCC)   Gangrene of toe of right foot (HCC)   Discitis of thoracolumbar region   Endocarditis of mitral valve   Diabetes mellitus type II, uncontrolled (HCC)   HTN (hypertension)   Rectal mass   DVT of popliteal vein (HCC)   Scheduled Meds: . atorvastatin  10 mg Oral q1800  . clopidogrel  75 mg Oral Q breakfast  . insulin aspart  0-9 Units Subcutaneous TID WC  . insulin glargine  15 Units Subcutaneous QHS  . pneumococcal 23 valent vaccine  0.5 mL Intramuscular Tomorrow-1000  . saccharomyces boulardii  250 mg Oral BID  . senna-docusate  2 tablet Oral BID  . sodium chloride flush  3 mL Intravenous Once  . sodium chloride flush  3 mL Intravenous Q12H   Continuous Infusions: . sodium chloride 250 mL (04/13/19 2351)  . sodium chloride Stopped (04/13/19 2230)  . sodium chloride    . [START ON 04/22/2019]  ceFAZolin (ANCEF) IV    . lactated ringers 100 mL/hr at 04/21/19 0400  . vancomycin 1,000 mg (04/21/19 0559)   PRN Meds:.sodium chloride, sodium chloride, sodium chloride, acetaminophen, acetaminophen, hydrALAZINE, HYDROmorphone (DILAUDID) injection, ibuprofen, labetalol, ondansetron **OR** ondansetron (ZOFRAN) IV, oxyCODONE, simethicone, sodium chloride flush   SUBJECTIVE: Thomas [name] is a 44-year-old diabetic who had a prolonged hospitalization at [facility] recently (October 18-November 5). He was admitted with DKA and was found to have MRSA bacteremia  with widely disseminated infection. He was found to have a right great toe paronychia T9 osteomyelitis, possible mitral valve endocarditis, and pyomyositis of his left thigh and right lower leg. He was persistently bacteremic on vancomycin until 02/21/2019. He was switched to ceftaroline plus daptomycin. He underwent I&D of his left and right leg abscesses on 02/05/2019 and cultures were positive for MRSA. He had repeat I&D on his right lower leg on 03/04/2019 and cultures were negative. He was discharged on 03/09/2019 on daptomycin alone. He continued that at the outpatient infusion center in [place] through 04/04/2019.  Within 1 week of stopping antibiotics he began having increasing back pain and darkening of the first second and third toes on his right foot leading to readmission here on 04/13/2019. Blood cultures were positive for MRSA again. MRI showed progressive infection at the T9 level with new discitis at T8 and T10.  MRI of his right foot raised concern for possible osteomyelitis in his distal right second toe.  He underwent TEE which did not show any vegetations or other evidence of persistent endocarditis.He is only on vancomycin now. Repeat blood cultures are negative at a little over 48 hours. He says that his back pain is severe but no worse than it was upon admission.  He is scheduled for vascular surgery on his right leg tomorrow.  Review of Systems: Review of Systems  Constitutional: Positive for malaise/fatigue. Negative for fever.  Respiratory: Negative for cough and shortness of breath.     Cardiovascular: Negative for chest pain.  Musculoskeletal: Positive for back pain and joint pain.    No Known Allergies  OBJECTIVE: Vitals:   04/21/19 1000 04/21/19 1430 04/21/19 1500 04/21/19 1508  BP:  (!) 143/95 (!) 145/102   Pulse: (!) 102 87 (!) 102   Resp: 17 (!) 23 (!) 23   Temp:    97.8 F (36.6 C)  TempSrc:   Oral Oral  SpO2: 100%     Weight:      Height:       Body mass  index is 27.65 kg/m.  Physical Exam Constitutional:      Comments: He is resting quietly in bed.  I examined him with the aid of the Arabic, video interpreter.  Cardiovascular:     Rate and Rhythm: Normal rate and regular rhythm.     Heart sounds: No murmur.  Pulmonary:     Effort: Pulmonary effort is normal.     Breath sounds: Normal breath sounds.  Abdominal:     Palpations: Abdomen is soft.     Tenderness: There is no abdominal tenderness.  Musculoskeletal:        General: No swelling or tenderness.     Comments: He has some dry gangrene of his right first, second and third toes.  There is no drainage or odor.  I do not find any evidence of persistent infection in his left thigh or right calf.  Skin:    Findings: No rash.     Lab Results Lab Results  Component Value Date   WBC 5.8 04/21/2019   HGB 8.1 (L) 04/21/2019   HCT 26.4 (L) 04/21/2019   MCV 77.6 (L) 04/21/2019   PLT 531 (H) 04/21/2019    Lab Results  Component Value Date   CREATININE 0.67 04/21/2019   BUN <5 (L) 04/21/2019   NA 139 04/21/2019   K 3.8 04/21/2019   CL 105 04/21/2019   CO2 23 04/21/2019    Lab Results  Component Value Date   ALT 25 04/13/2019   AST 14 (L) 04/13/2019   ALKPHOS 84 04/13/2019   BILITOT 0.4 04/13/2019     Microbiology: Recent Results (from the past 240 hour(s))  Blood culture (routine x 2)     Status: Abnormal   Collection Time: 04/13/19 12:45 PM   Specimen: BLOOD  Result Value Ref Range Status   Specimen Description   Final    BLOOD RIGHT ANTECUBITAL Performed at [facility], [address] [place], [place] [address]    Special Requests   Final    BOTTLES DRAWN AEROBIC AND ANAEROBIC Blood Culture adequate volume Performed at [facility], [address] [place], [place] [address]    Culture  Setup Time   Final    GRAM POSITIVE COCCI IN CLUSTERS AEROBIC BOTTLE ONLY CRITICAL RESULT CALLED TO, READ BACK BY AND VERIFIED WITH: M. Tyndall, AT [phone] 04/15/19 BY D. VANHOOK    Culture (A)  Final    STAPHYLOCOCCUS AUREUS SUSCEPTIBILITIES PERFORMED ON PREVIOUS CULTURE WITHIN THE LAST 5 DAYS. Performed at [facility], [place] [address], [place], [place] [address]    Report Status 04/16/2019 FINAL  Final  Blood Culture ID Panel (Reflexed)     Status: Abnormal   Collection Time: 04/13/19 12:45 PM  Result Value Ref Range Status   Enterococcus species NOT DETECTED NOT DETECTED Final   Listeria monocytogenes NOT DETECTED NOT DETECTED Final   Staphylococcus species DETECTED (A)  NOT DETECTED Final    Comment: CRITICAL RESULT CALLED TO, READ BACK BY AND VERIFIED WITH: M. Knowlton, AT [phone] 04/15/19 BY D. VANHOOK    Staphylococcus aureus (BCID) DETECTED (A) NOT DETECTED Final    Comment: Methicillin (oxacillin)-resistant Staphylococcus aureus (MRSA). MRSA is predictably resistant to beta-lactam antibiotics (except ceftaroline). Preferred therapy is vancomycin unless clinically contraindicated. Patient requires contact precautions if  hospitalized. CRITICAL RESULT CALLED TO, READ BACK BY AND VERIFIED WITH: [place], AT [phone] 04/15/19 BY D. VANHOOK    Methicillin resistance DETECTED (A) NOT DETECTED Final    Comment: CRITICAL RESULT CALLED TO, READ BACK BY AND VERIFIED WITH: Hughie Closs PHARMD, AT [phone] 04/15/19 BY D. VANHOOK    Streptococcus species NOT DETECTED NOT DETECTED Final   Streptococcus agalactiae NOT DETECTED NOT DETECTED Final   Streptococcus pneumoniae NOT DETECTED NOT DETECTED Final   Streptococcus pyogenes NOT DETECTED NOT DETECTED Final   Acinetobacter baumannii NOT DETECTED NOT DETECTED Final   Enterobacteriaceae species NOT DETECTED NOT DETECTED Final   Enterobacter cloacae complex NOT DETECTED NOT DETECTED Final   Escherichia coli NOT DETECTED NOT DETECTED Final   Klebsiella oxytoca NOT DETECTED NOT DETECTED Final   Klebsiella pneumoniae NOT DETECTED NOT DETECTED Final   Proteus species NOT  DETECTED NOT DETECTED Final   Serratia marcescens NOT DETECTED NOT DETECTED Final   Haemophilus influenzae NOT DETECTED NOT DETECTED Final   Neisseria meningitidis NOT DETECTED NOT DETECTED Final   Pseudomonas aeruginosa NOT DETECTED NOT DETECTED Final   Candida albicans NOT DETECTED NOT DETECTED Final   Candida glabrata NOT DETECTED NOT DETECTED Final   Candida krusei NOT DETECTED NOT DETECTED Final   Candida parapsilosis NOT DETECTED NOT DETECTED Final   Candida tropicalis NOT DETECTED NOT DETECTED Final    Comment: Performed at [facility], [place], [place], [place] [address]  SARS Coronavirus 2 Ag (30 min TAT) - Nasal Swab (BD Veritor Kit)     Status: None   Collection Time: 04/13/19 12:52 PM   Specimen: Nasal Swab (BD Veritor Kit)  Result Value Ref Range Status   SARS Coronavirus 2 Ag NEGATIVE NEGATIVE Final    Comment: (NOTE) SARS-CoV-2 antigen NOT DETECTED.  Negative results are presumptive.  Negative results do not preclude SARS-CoV-2 infection and should not be used as the sole basis for treatment or other patient management decisions, including infection  control decisions, particularly in the presence of clinical signs and  symptoms consistent with COVID-19, or in those who have been in contact with the virus.  Negative results must be combined with clinical observations, patient history, and epidemiological information. The expected result is Negative. Fact Sheet for Patients: PodPark.tn Fact Sheet for Healthcare Providers: GiftContent.is This test is not yet approved or cleared by the [place] FDA and  has been authorized for detection and/or diagnosis of SARS-CoV-2 by FDA under an Emergency Use Authorization (EUA).  This EUA will remain in effect (meaning this test can be used) for the duration of  the COVID-19 de claration under Section 564(b)(1) of the Act, 21 U.S.C. section 360bbb-3(b)(1),  unless the authorization is terminated or revoked sooner. Performed at [facility], [address] [place], [place] [address]   Blood culture (routine x 2)     Status: Abnormal   Collection Time: 04/13/19 12:56 PM   Specimen: BLOOD  Result Value Ref Range Status   Specimen Description   Final    BLOOD LEFT ANTECUBITAL Performed at  [facility] [place], [address] [place], [place] [address]    Special Requests   Final    BOTTLES DRAWN AEROBIC AND ANAEROBIC Blood Culture adequate volume Performed at [facility] [place], [address] [place], [place] [address]    Culture  Setup Time   Final    GRAM POSITIVE COCCI IN BOTH AEROBIC AND ANAEROBIC BOTTLES CRITICAL RESULT CALLED TO, READ BACK BY AND VERIFIED WITH: PHARND J FRANCE 123020 AT 950 AM BY CM Performed at [facility], [address] [place], [place] [address]    Culture METHICILLIN RESISTANT STAPHYLOCOCCUS AUREUS (A)  Final   Report Status 04/16/2019 FINAL  Final   Organism ID, Bacteria METHICILLIN RESISTANT STAPHYLOCOCCUS AUREUS  Final      Susceptibility   Methicillin resistant staphylococcus aureus - MIC*    CIPROFLOXACIN >=8 RESISTANT Resistant     ERYTHROMYCIN >=8 RESISTANT Resistant     GENTAMICIN <=0.5 SENSITIVE Sensitive     OXACILLIN >=4 RESISTANT Resistant     TETRACYCLINE <=1 SENSITIVE Sensitive     VANCOMYCIN <=0.5 SENSITIVE Sensitive     TRIMETH/SULFA <=10 SENSITIVE Sensitive     CLINDAMYCIN >=8 RESISTANT Resistant     RIFAMPIN <=0.5 SENSITIVE Sensitive     Inducible Clindamycin NEGATIVE Sensitive     * METHICILLIN RESISTANT STAPHYLOCOCCUS AUREUS  SARS CORONAVIRUS 2 (TAT 6-24 HRS) Nasopharyngeal Nasopharyngeal Swab     Status: None   Collection Time: 04/13/19  3:31 PM   Specimen: Nasopharyngeal Swab  Result Value Ref Range Status   SARS Coronavirus 2 NEGATIVE NEGATIVE Final    Comment: (NOTE) SARS-CoV-2 target nucleic acids are NOT DETECTED. The SARS-CoV-2 RNA is  generally detectable in upper and lower respiratory specimens during the acute phase of infection. Negative results do not preclude SARS-CoV-2 infection, do not rule out co-infections with other pathogens, and should not be used as the sole basis for treatment or other patient management decisions. Negative results must be combined with clinical observations, patient history, and epidemiological information. The expected result is Negative. Fact Sheet for Patients: https://www.fda.gov/media/138098/download Fact Sheet for Healthcare Providers: https://www.fda.gov/media/138095/download This test is not yet approved or cleared by the [place] FDA and  has been authorized for detection and/or diagnosis of SARS-CoV-2 by FDA under an Emergency Use Authorization (EUA). This EUA will remain  in effect (meaning this test can be used) for the duration of the COVID-19 declaration under Section 56 4(b)(1) of the Act, 21 U.S.C. section 360bbb-3(b)(1), unless the authorization is terminated or revoked sooner. Performed at [facility], [address] [place], [place] [address]   Urine culture     Status: None   Collection Time: 04/13/19  7:45 PM   Specimen: Urine, Random  Result Value Ref Range Status   Specimen Description   Final    URINE, RANDOM Performed at [facility] [place], [address] [place], [place] [address]    Special Requests   Final    NONE Performed at [facility] [place], [address] [place], [place] [address]    Culture   Final    NO GROWTH Performed at [facility], [address] [place], [place] [address]    Report Status 04/14/2019 FINAL  Final  Surgical pcr screen     Status: Abnormal   Collection Time: 04/14/19  4:42 AM   Specimen: Nasal Mucosa; Nasal Swab  Result Value Ref Range Status   MRSA, PCR   POSITIVE (A) NEGATIVE Final    Comment: RESULT CALLED TO, READ BACK BY AND VERIFIED WITH: C JOHNSON ON 123020 AT 1143 BY NFIELDS      Staphylococcus aureus POSITIVE (A) NEGATIVE Final    Comment: (NOTE) The Xpert SA Assay (FDA approved for NASAL specimens in patients 22 years of age and older), is one component of a comprehensive surveillance program. It is not intended to diagnose infection nor to guide or monitor treatment. Performed at [facility], [address] [place], [place] [address]   Culture, blood (routine x 2)     Status: Abnormal   Collection Time: 04/14/19 12:31 PM   Specimen: BLOOD RIGHT HAND  Result Value Ref Range Status   Specimen Description BLOOD RIGHT HAND  Final   Special Requests   Final    BOTTLES DRAWN AEROBIC AND ANAEROBIC Blood Culture adequate volume   Culture  Setup Time   Final    GRAM POSITIVE COCCI ANAEROBIC BOTTLE ONLY CRITICAL RESULT CALLED TO, READ BACK BY AND VERIFIED WITH: T. BAUMEISTER, PHARMD AT 1324 ON 04/18/19 BY C. JESSUP, MT. Performed at [facility], [address] [place], [place] [address]    Culture METHICILLIN RESISTANT STAPHYLOCOCCUS AUREUS (A)  Final   Report Status 04/20/2019 FINAL  Final   Organism ID, Bacteria METHICILLIN RESISTANT STAPHYLOCOCCUS AUREUS  Final      Susceptibility   Methicillin resistant staphylococcus aureus - MIC*    CIPROFLOXACIN >=8 RESISTANT Resistant     ERYTHROMYCIN >=8 RESISTANT Resistant     GENTAMICIN <=0.5 SENSITIVE Sensitive     OXACILLIN >=4 RESISTANT Resistant     TETRACYCLINE <=1 SENSITIVE Sensitive     VANCOMYCIN <=0.5 SENSITIVE Sensitive     TRIMETH/SULFA <=10 SENSITIVE Sensitive     CLINDAMYCIN >=8 RESISTANT Resistant     RIFAMPIN <=0.5 SENSITIVE Sensitive     Inducible Clindamycin NEGATIVE Sensitive     * METHICILLIN RESISTANT STAPHYLOCOCCUS AUREUS  Culture, blood (routine x 2)     Status: None   Collection Time: 04/14/19 12:46 PM   Specimen: BLOOD LEFT HAND  Result Value Ref Range Status   Specimen Description BLOOD LEFT HAND  Final   Special Requests   Final    BOTTLES DRAWN AEROBIC ONLY Blood  Culture results may not be optimal due to an inadequate volume of blood received in culture bottles   Culture   Final    NO GROWTH 5 DAYS Performed at [facility], [address] [place], [place] [address]    Report Status 04/19/2019 FINAL  Final  Aerobic/Anaerobic Culture (surgical/deep wound)     Status: None   Collection Time: 04/15/19  1:45 PM   Specimen: A: PATH Bone biopsy   B: PATH Other; Abscess  Result Value Ref Range Status   Specimen Description BONE BIOPSY T9  Final   Special Requests NONE  Final   Gram Stain   Final    RARE WBC PRESENT, PREDOMINANTLY MONONUCLEAR RARE GRAM POSITIVE COCCI    Culture   Final    RARE METHICILLIN RESISTANT STAPHYLOCOCCUS AUREUS NO ANAEROBES ISOLATED Performed at [facility], [address] [place], [place] [address]    Report Status 04/20/2019 FINAL  Final   Organism ID, Bacteria METHICILLIN RESISTANT STAPHYLOCOCCUS AUREUS  Final      Susceptibility   Methicillin resistant staphylococcus aureus - MIC*    CIPROFLOXACIN >=8 RESISTANT Resistant     ERYTHROMYCIN >=8 RESISTANT Resistant       GENTAMICIN <=0.5 SENSITIVE Sensitive     OXACILLIN >=4 RESISTANT Resistant     TETRACYCLINE <=1 SENSITIVE Sensitive     VANCOMYCIN 2 SENSITIVE Sensitive     TRIMETH/SULFA <=10 SENSITIVE Sensitive     CLINDAMYCIN >=8 RESISTANT Resistant     RIFAMPIN <=0.5 SENSITIVE Sensitive     Inducible Clindamycin NEGATIVE Sensitive     * RARE METHICILLIN RESISTANT STAPHYLOCOCCUS AUREUS  Aerobic/Anaerobic Culture (surgical/deep wound)     Status: None   Collection Time: 04/15/19  1:53 PM   Specimen: Abscess; Tissue  Result Value Ref Range Status   Specimen Description ABSCESS T9 DISC ASPIRATION 2  Final   Special Requests Normal  Final   Gram Stain NO WBC SEEN RARE GRAM POSITIVE COCCI IN PAIRS   Final   Culture   Final    RARE METHICILLIN RESISTANT STAPHYLOCOCCUS AUREUS NO ANAEROBES ISOLATED Performed at [facility], [address] [place], [place] [address]    Report Status 04/20/2019 FINAL  Final   Organism ID, Bacteria METHICILLIN RESISTANT STAPHYLOCOCCUS AUREUS  Final      Susceptibility   Methicillin resistant staphylococcus aureus - MIC*    CIPROFLOXACIN >=8 RESISTANT Resistant     ERYTHROMYCIN >=8 RESISTANT Resistant     GENTAMICIN <=0.5 SENSITIVE Sensitive     OXACILLIN >=4 RESISTANT Resistant     TETRACYCLINE <=1 SENSITIVE Sensitive     VANCOMYCIN <=0.5 SENSITIVE Sensitive     TRIMETH/SULFA <=10 SENSITIVE Sensitive     CLINDAMYCIN >=8 RESISTANT Resistant     RIFAMPIN <=0.5 SENSITIVE Sensitive     Inducible Clindamycin NEGATIVE Sensitive     * RARE METHICILLIN RESISTANT STAPHYLOCOCCUS AUREUS  Aerobic/Anaerobic Culture (surgical/deep wound)     Status: None   Collection Time: 04/15/19  1:53 PM   Specimen: Abscess  Result Value Ref Range Status   Specimen Description ABSCESS T9 DISC ASPIRATION  Final   Special Requests Normal  Final   Gram Stain NO WBC SEEN NO ORGANISMS SEEN   Final   Culture   Final    RARE METHICILLIN RESISTANT STAPHYLOCOCCUS AUREUS NO ANAEROBES ISOLATED Performed at [facility], [address], [place], [place] [address]    Report Status 04/20/2019 FINAL  Final   Organism ID, Bacteria METHICILLIN RESISTANT STAPHYLOCOCCUS AUREUS  Final      Susceptibility   Methicillin resistant staphylococcus aureus - MIC*    CIPROFLOXACIN >=8 RESISTANT Resistant     ERYTHROMYCIN >=8 RESISTANT Resistant     GENTAMICIN <=0.5 SENSITIVE Sensitive     OXACILLIN >=4 RESISTANT Resistant     TETRACYCLINE <=1 SENSITIVE Sensitive     VANCOMYCIN 1 SENSITIVE Sensitive     TRIMETH/SULFA <=10 SENSITIVE Sensitive     CLINDAMYCIN >=8 RESISTANT Resistant     RIFAMPIN <=0.5 SENSITIVE Sensitive     Inducible Clindamycin NEGATIVE Sensitive     * RARE METHICILLIN RESISTANT STAPHYLOCOCCUS AUREUS  Culture, blood (routine x 2)     Status: None (Preliminary result)   Collection Time: 04/19/19  3:06 PM    Specimen: BLOOD  Result Value Ref Range Status   Specimen Description BLOOD ASSIST CONTROL  Final   Special Requests   Final    BOTTLES DRAWN AEROBIC ONLY Blood Culture adequate volume   Culture   Final    NO GROWTH 2 DAYS Performed at [facility], [address] [place], [place] [address]    Report Status PENDING  Incomplete  Culture, blood (routine x 2)  Status: None (Preliminary result)   Collection Time: 04/19/19  3:06 PM   Specimen: BLOOD LEFT HAND  Result Value Ref Range Status   Specimen Description BLOOD LEFT HAND  Final   Special Requests   Final    BOTTLES DRAWN AEROBIC ONLY Blood Culture adequate volume   Culture   Final    NO GROWTH 2 DAYS Performed at [facility], [address] [place], [place] [address]    Report Status PENDING  Incomplete    John Campbell, MD [facility] [phone] pager   [phone] cell 04/21/2019, 4:17 PM 

## 2019-04-22 ENCOUNTER — Encounter (HOSPITAL_COMMUNITY): Payer: Self-pay | Admitting: Internal Medicine

## 2019-04-22 ENCOUNTER — Encounter (HOSPITAL_BASED_OUTPATIENT_CLINIC_OR_DEPARTMENT_OTHER): Payer: No Typology Code available for payment source | Admitting: Internal Medicine

## 2019-04-22 ENCOUNTER — Encounter (HOSPITAL_COMMUNITY)
Admission: EM | Disposition: A | Discharge status: Home Health | Payer: Self-pay | Source: Home / Self Care | Attending: Internal Medicine

## 2019-04-22 ENCOUNTER — Inpatient Hospital Stay (HOSPITAL_COMMUNITY): Payer: Medicaid Other | Admitting: Anesthesiology

## 2019-04-22 ENCOUNTER — Ambulatory Visit (HOSPITAL_BASED_OUTPATIENT_CLINIC_OR_DEPARTMENT_OTHER): Payer: No Typology Code available for payment source | Admitting: Internal Medicine

## 2019-04-22 DIAGNOSIS — E1169 Type 2 diabetes mellitus with other specified complication: Secondary | ICD-10-CM

## 2019-04-22 DIAGNOSIS — L02416 Cutaneous abscess of left lower limb: Secondary | ICD-10-CM

## 2019-04-22 DIAGNOSIS — Z95828 Presence of other vascular implants and grafts: Secondary | ICD-10-CM

## 2019-04-22 DIAGNOSIS — L02415 Cutaneous abscess of right lower limb: Secondary | ICD-10-CM

## 2019-04-22 DIAGNOSIS — Z978 Presence of other specified devices: Secondary | ICD-10-CM

## 2019-04-22 DIAGNOSIS — M4645 Discitis, unspecified, thoracolumbar region: Secondary | ICD-10-CM

## 2019-04-22 HISTORY — PX: FEMORAL-TIBIAL BYPASS GRAFT: SHX938

## 2019-04-22 LAB — CBC
HCT: 29.6 % — ABNORMAL LOW (ref 39.0–52.0)
Hemoglobin: 8.9 g/dL — ABNORMAL LOW (ref 13.0–17.0)
MCH: 24.1 pg — ABNORMAL LOW (ref 26.0–34.0)
MCHC: 30.1 g/dL (ref 30.0–36.0)
MCV: 80 fL (ref 80.0–100.0)
Platelets: 550 10*3/uL — ABNORMAL HIGH (ref 150–400)
RBC: 3.7 MIL/uL — ABNORMAL LOW (ref 4.22–5.81)
RDW: 14.6 % (ref 11.5–15.5)
WBC: 7.1 10*3/uL (ref 4.0–10.5)
nRBC: 0 % (ref 0.0–0.2)

## 2019-04-22 LAB — POCT ACTIVATED CLOTTING TIME
Activated Clotting Time: 202 seconds
Activated Clotting Time: 219 seconds
Activated Clotting Time: 252 seconds

## 2019-04-22 LAB — BASIC METABOLIC PANEL
Anion gap: 12 (ref 5–15)
BUN: 7 mg/dL (ref 6–20)
CO2: 26 mmol/L (ref 22–32)
Calcium: 9.4 mg/dL (ref 8.9–10.3)
Chloride: 100 mmol/L (ref 98–111)
Creatinine, Ser: 0.77 mg/dL (ref 0.61–1.24)
GFR calc Af Amer: >60 mL/min (ref >60)
GFR calc non Af Amer: >60 mL/min (ref >60)
Glucose, Bld: 116 mg/dL — ABNORMAL HIGH (ref 70–99)
Potassium: 4 mmol/L (ref 3.5–5.1)
Sodium: 138 mmol/L (ref 135–145)

## 2019-04-22 LAB — GLUCOSE, CAPILLARY
Glucose-Capillary: 129 mg/dL — ABNORMAL HIGH (ref 70–99)
Glucose-Capillary: 121 mg/dL — ABNORMAL HIGH (ref 70–99)
Glucose-Capillary: 168 mg/dL — ABNORMAL HIGH (ref 70–99)
Glucose-Capillary: 144 mg/dL — ABNORMAL HIGH (ref 70–99)
Glucose-Capillary: 144 mg/dL — ABNORMAL HIGH (ref 70–99)

## 2019-04-22 LAB — SURGICAL PCR SCREEN
MRSA, PCR: NEGATIVE
Staphylococcus aureus: NEGATIVE

## 2019-04-22 LAB — ABO/RH: ABO/RH(D): O POS

## 2019-04-22 LAB — PROTIME-INR
INR: 1 (ref 0.8–1.2)
Prothrombin Time: 13.5 seconds (ref 11.4–15.2)

## 2019-04-22 SURGERY — CREATION, BYPASS, ARTERIAL, FEMORAL TO TIBIAL, USING GRAFT
Anesthesia: General | Site: Leg Upper | Laterality: Right

## 2019-04-22 MED ORDER — SODIUM CHLORIDE 0.9 % IV SOLN
INTRAVENOUS | Status: DC | PRN
  Administered 2019-04-22: 500 mL

## 2019-04-22 MED ORDER — HEPARIN SODIUM (PORCINE) 1000 UNIT/ML IJ SOLN
INTRAMUSCULAR | Status: AC
  Filled 2019-04-22: qty 1

## 2019-04-22 MED ORDER — PROPOFOL 10 MG/ML IV BOLUS
INTRAVENOUS | Status: AC
  Filled 2019-04-22: qty 20

## 2019-04-22 MED ORDER — FENTANYL CITRATE (PF) 250 MCG/5ML IJ SOLN
INTRAMUSCULAR | Status: AC
  Filled 2019-04-22: qty 5

## 2019-04-22 MED ORDER — SODIUM CHLORIDE 0.9 % IV SOLN
INTRAVENOUS | Status: AC
  Filled 2019-04-22: qty 1.2

## 2019-04-22 MED ORDER — HYDROMORPHONE 1 MG/ML IV SOLN
INTRAVENOUS | Status: DC
  Administered 2019-04-22: 30 mg via INTRAVENOUS
  Administered 2019-04-22 – 2019-04-23 (×2): 1.2 mg via INTRAVENOUS
  Administered 2019-04-23: 1.8 mg via INTRAVENOUS
  Administered 2019-04-23: 0.6 mg via INTRAVENOUS
  Administered 2019-04-23: 0.9 mg via INTRAVENOUS
  Administered 2019-04-24: 1.2 mg via INTRAVENOUS
  Administered 2019-04-24: 2.7 mg via INTRAVENOUS
  Administered 2019-04-24: 1.8 mg via INTRAVENOUS
  Administered 2019-04-25 (×2): 2.1 mg via INTRAVENOUS
  Administered 2019-04-25: 30 mg via INTRAVENOUS
  Administered 2019-04-25: 1.2 mg via INTRAVENOUS
  Administered 2019-04-26: 2.4 mg via INTRAVENOUS
  Filled 2019-04-22 (×2): qty 30

## 2019-04-22 MED ORDER — DEXAMETHASONE SODIUM PHOSPHATE 10 MG/ML IJ SOLN
INTRAMUSCULAR | Status: AC
  Filled 2019-04-22: qty 1

## 2019-04-22 MED ORDER — LIDOCAINE 2% (20 MG/ML) 5 ML SYRINGE
INTRAMUSCULAR | Status: AC
  Filled 2019-04-22: qty 5

## 2019-04-22 MED ORDER — ROCURONIUM BROMIDE 10 MG/ML (PF) SYRINGE
PREFILLED_SYRINGE | INTRAVENOUS | Status: AC
  Filled 2019-04-22: qty 10

## 2019-04-22 MED ORDER — PROTAMINE SULFATE 10 MG/ML IV SOLN
INTRAVENOUS | Status: AC
  Filled 2019-04-22: qty 5

## 2019-04-22 MED ORDER — GUAIFENESIN-DM 100-10 MG/5ML PO SYRP: 15.0000 mL | ORAL_SOLUTION | ORAL | Status: DC | PRN

## 2019-04-22 MED ORDER — FENTANYL CITRATE (PF) 100 MCG/2ML IJ SOLN
INTRAMUSCULAR | Status: DC | PRN
  Administered 2019-04-22: 100 ug via INTRAVENOUS
  Administered 2019-04-22 (×2): 50 ug via INTRAVENOUS
  Administered 2019-04-22: 100 ug via INTRAVENOUS
  Administered 2019-04-22 (×4): 50 ug via INTRAVENOUS

## 2019-04-22 MED ORDER — PROTAMINE SULFATE 10 MG/ML IV SOLN
INTRAVENOUS | Status: DC | PRN
  Administered 2019-04-22: 10 mg via INTRAVENOUS
  Administered 2019-04-22: 15 mg via INTRAVENOUS
  Administered 2019-04-22: 25 mg via INTRAVENOUS

## 2019-04-22 MED ORDER — DOCUSATE SODIUM 100 MG PO CAPS
100.0000 mg | ORAL_CAPSULE | Freq: Every day | ORAL | Status: DC
  Administered 2019-04-23 – 2019-05-03 (×10): 100 mg via ORAL
  Filled 2019-04-22 (×12): qty 1

## 2019-04-22 MED ORDER — HYDRALAZINE HCL 20 MG/ML IJ SOLN: 5.0000 mg | INTRAMUSCULAR | Status: DC | PRN

## 2019-04-22 MED ORDER — PHENYLEPHRINE 40 MCG/ML (10ML) SYRINGE FOR IV PUSH (FOR BLOOD PRESSURE SUPPORT)
PREFILLED_SYRINGE | INTRAVENOUS | Status: DC | PRN
  Administered 2019-04-22: 40 ug via INTRAVENOUS
  Administered 2019-04-22 (×2): 80 ug via INTRAVENOUS

## 2019-04-22 MED ORDER — CEFAZOLIN SODIUM-DEXTROSE 2-4 GM/100ML-% IV SOLN
2.0000 g | Freq: Three times a day (TID) | INTRAVENOUS | Status: AC
  Administered 2019-04-22 – 2019-04-23 (×2): 2 g via INTRAVENOUS
  Filled 2019-04-22 (×2): qty 100

## 2019-04-22 MED ORDER — SODIUM CHLORIDE 0.9 % IV SOLN
600.0000 mg | Freq: Three times a day (TID) | INTRAVENOUS | Status: DC
  Administered 2019-04-22 – 2019-05-03 (×34): 600 mg via INTRAVENOUS
  Filled 2019-04-22 (×40): qty 600

## 2019-04-22 MED ORDER — FENTANYL CITRATE (PF) 100 MCG/2ML IJ SOLN
25.0000 ug | INTRAMUSCULAR | Status: DC | PRN
  Administered 2019-04-22: 50 ug via INTRAVENOUS

## 2019-04-22 MED ORDER — ONDANSETRON HCL 4 MG/2ML IJ SOLN: 4.0000 mg | Freq: Once | INTRAMUSCULAR | Status: DC | PRN

## 2019-04-22 MED ORDER — PANTOPRAZOLE SODIUM 40 MG PO TBEC
40.0000 mg | DELAYED_RELEASE_TABLET | Freq: Every day | ORAL | Status: DC
  Administered 2019-04-23 – 2019-05-03 (×11): 40 mg via ORAL
  Filled 2019-04-22 (×12): qty 1

## 2019-04-22 MED ORDER — PHENOL 1.4 % MT LIQD: 1.0000 | OROMUCOSAL | Status: DC | PRN

## 2019-04-22 MED ORDER — DEXMEDETOMIDINE HCL 200 MCG/2ML IV SOLN
INTRAVENOUS | Status: DC | PRN
  Administered 2019-04-22: 16 ug via INTRAVENOUS
  Administered 2019-04-22: 8 ug via INTRAVENOUS
  Administered 2019-04-22: 16 ug via INTRAVENOUS

## 2019-04-22 MED ORDER — NALOXONE HCL 0.4 MG/ML IJ SOLN: 0.4000 mg | INTRAMUSCULAR | Status: DC | PRN

## 2019-04-22 MED ORDER — MIDAZOLAM HCL 2 MG/2ML IJ SOLN
INTRAMUSCULAR | Status: AC
  Filled 2019-04-22: qty 2

## 2019-04-22 MED ORDER — MIDAZOLAM HCL 5 MG/5ML IJ SOLN
INTRAMUSCULAR | Status: DC | PRN
  Administered 2019-04-22 (×2): 1 mg via INTRAVENOUS

## 2019-04-22 MED ORDER — OXYCODONE HCL 5 MG/5ML PO SOLN: 5.0000 mg | Freq: Once | ORAL | Status: DC | PRN

## 2019-04-22 MED ORDER — OXYCODONE HCL 5 MG PO TABS: 5.0000 mg | ORAL_TABLET | Freq: Once | ORAL | Status: DC | PRN

## 2019-04-22 MED ORDER — ALBUMIN HUMAN 5 % IV SOLN: INTRAVENOUS | Status: DC | PRN

## 2019-04-22 MED ORDER — PHENYLEPHRINE HCL-NACL 10-0.9 MG/250ML-% IV SOLN
INTRAVENOUS | Status: DC | PRN
  Administered 2019-04-22: 30 ug/min via INTRAVENOUS

## 2019-04-22 MED ORDER — SODIUM CHLORIDE 0.9 % IV BOLUS: 500.0000 mL | Freq: Once | INTRAVENOUS | Status: AC

## 2019-04-22 MED ORDER — 0.9 % SODIUM CHLORIDE (POUR BTL) OPTIME
TOPICAL | Status: DC | PRN
  Administered 2019-04-22: 2000 mL

## 2019-04-22 MED ORDER — LACTATED RINGERS IV SOLN: INTRAVENOUS | Status: DC | PRN

## 2019-04-22 MED ORDER — DIPHENHYDRAMINE HCL 50 MG/ML IJ SOLN: 12.5000 mg | Freq: Four times a day (QID) | INTRAMUSCULAR | Status: DC | PRN

## 2019-04-22 MED ORDER — HYDROMORPHONE HCL 1 MG/ML IJ SOLN
2.0000 mg | Freq: Once | INTRAMUSCULAR | Status: AC
  Administered 2019-04-22: 2 mg via INTRAVENOUS

## 2019-04-22 MED ORDER — ONDANSETRON HCL 4 MG/2ML IJ SOLN
INTRAMUSCULAR | Status: DC | PRN
  Administered 2019-04-22: 4 mg via INTRAVENOUS

## 2019-04-22 MED ORDER — SODIUM CHLORIDE 0.9 % IV SOLN
500.0000 mL | Freq: Once | INTRAVENOUS | Status: AC | PRN
  Administered 2019-04-22: 19:00:00 500 mL via INTRAVENOUS

## 2019-04-22 MED ORDER — SODIUM CHLORIDE 0.9 % IV SOLN
850.0000 mg | Freq: Every day | INTRAVENOUS | Status: DC
  Administered 2019-04-22 – 2019-05-03 (×12): 850 mg via INTRAVENOUS
  Filled 2019-04-22 (×13): qty 17

## 2019-04-22 MED ORDER — SODIUM CHLORIDE 0.9 % IV SOLN: INTRAVENOUS | Status: DC

## 2019-04-22 MED ORDER — METOPROLOL TARTRATE 5 MG/5ML IV SOLN: 2.0000 mg | INTRAVENOUS | Status: DC | PRN

## 2019-04-22 MED ORDER — PHENYLEPHRINE 40 MCG/ML (10ML) SYRINGE FOR IV PUSH (FOR BLOOD PRESSURE SUPPORT)
PREFILLED_SYRINGE | INTRAVENOUS | Status: AC
  Filled 2019-04-22: qty 10

## 2019-04-22 MED ORDER — ONDANSETRON HCL 4 MG/2ML IJ SOLN
INTRAMUSCULAR | Status: AC
  Filled 2019-04-22: qty 2

## 2019-04-22 MED ORDER — ROCURONIUM BROMIDE 50 MG/5ML IV SOSY
PREFILLED_SYRINGE | INTRAVENOUS | Status: DC | PRN
  Administered 2019-04-22: 20 mg via INTRAVENOUS
  Administered 2019-04-22: 50 mg via INTRAVENOUS
  Administered 2019-04-22: 10 mg via INTRAVENOUS
  Administered 2019-04-22: 20 mg via INTRAVENOUS
  Administered 2019-04-22: 30 mg via INTRAVENOUS
  Administered 2019-04-22: 20 mg via INTRAVENOUS

## 2019-04-22 MED ORDER — DIPHENHYDRAMINE HCL 12.5 MG/5ML PO ELIX
12.5000 mg | ORAL_SOLUTION | Freq: Four times a day (QID) | ORAL | Status: DC | PRN
  Filled 2019-04-22: qty 5

## 2019-04-22 MED ORDER — PROPOFOL 10 MG/ML IV BOLUS
INTRAVENOUS | Status: DC | PRN
  Administered 2019-04-22: 150 mg via INTRAVENOUS

## 2019-04-22 MED ORDER — POTASSIUM CHLORIDE CRYS ER 20 MEQ PO TBCR: 20.0000 meq | EXTENDED_RELEASE_TABLET | Freq: Every day | ORAL | Status: DC | PRN

## 2019-04-22 MED ORDER — DEXAMETHASONE SODIUM PHOSPHATE 10 MG/ML IJ SOLN
INTRAMUSCULAR | Status: DC | PRN
  Administered 2019-04-22: 4 mg via INTRAVENOUS

## 2019-04-22 MED ORDER — SODIUM CHLORIDE 0.9% FLUSH: 9.0000 mL | INTRAVENOUS | Status: DC | PRN

## 2019-04-22 MED ORDER — HEPARIN SODIUM (PORCINE) 1000 UNIT/ML IJ SOLN
INTRAMUSCULAR | Status: DC | PRN
  Administered 2019-04-22: 3000 [IU] via INTRAVENOUS
  Administered 2019-04-22: 9000 [IU] via INTRAVENOUS
  Administered 2019-04-22: 3000 [IU] via INTRAVENOUS

## 2019-04-22 MED ORDER — LIDOCAINE 2% (20 MG/ML) 5 ML SYRINGE
INTRAMUSCULAR | Status: DC | PRN
  Administered 2019-04-22: 40 mg via INTRAVENOUS
  Administered 2019-04-22: 60 mg via INTRAVENOUS

## 2019-04-22 MED ORDER — SUGAMMADEX SODIUM 200 MG/2ML IV SOLN
INTRAVENOUS | Status: DC | PRN
  Administered 2019-04-22: 200 mg via INTRAVENOUS

## 2019-04-22 MED ORDER — FENTANYL CITRATE (PF) 100 MCG/2ML IJ SOLN
INTRAMUSCULAR | Status: AC
  Filled 2019-04-22: qty 2

## 2019-04-22 MED ORDER — MAGNESIUM SULFATE 2 GM/50ML IV SOLN
2.0000 g | Freq: Every day | INTRAVENOUS | Status: DC | PRN
  Filled 2019-04-22: qty 50

## 2019-04-22 SURGICAL SUPPLY — 63 items
BANDAGE ESMARK 6X9 LF (GAUZE/BANDAGES/DRESSINGS) ×1 IMPLANT
BNDG ESMARK 6X9 LF (GAUZE/BANDAGES/DRESSINGS) ×3
CANISTER SUCT 3000ML PPV (MISCELLANEOUS) ×3 IMPLANT
CLIP VESOCCLUDE MED 24/CT (CLIP) ×3 IMPLANT
CLIP VESOCCLUDE SM WIDE 24/CT (CLIP) ×3 IMPLANT
COVER PROBE W GEL 5X96 (DRAPES) ×3 IMPLANT
COVER WAND RF STERILE (DRAPES) ×3 IMPLANT
CUFF TOURN SGL QUICK 18X4 (TOURNIQUET CUFF) IMPLANT
CUFF TOURN SGL QUICK 24 (TOURNIQUET CUFF)
CUFF TOURN SGL QUICK 34 (TOURNIQUET CUFF) ×2
CUFF TOURN SGL QUICK 42 (TOURNIQUET CUFF) IMPLANT
CUFF TRNQT CYL 24X4X16.5-23 (TOURNIQUET CUFF) IMPLANT
CUFF TRNQT CYL 34X4.125X (TOURNIQUET CUFF) ×1 IMPLANT
DERMABOND ADVANCED (GAUZE/BANDAGES/DRESSINGS) ×16
DERMABOND ADVANCED .7 DNX12 (GAUZE/BANDAGES/DRESSINGS) ×8 IMPLANT
DRAIN CHANNEL 15F RND FF W/TCR (WOUND CARE) ×3 IMPLANT
DRAPE C-ARM 42X72 X-RAY (DRAPES) ×3 IMPLANT
DRAPE HALF SHEET 40X57 (DRAPES) IMPLANT
ELECT REM PT RETURN 9FT ADLT (ELECTROSURGICAL) ×3
ELECTRODE REM PT RTRN 9FT ADLT (ELECTROSURGICAL) ×1 IMPLANT
EVACUATOR SILICONE 100CC (DRAIN) ×3 IMPLANT
GAUZE SPONGE 4X4 16PLY XRAY LF (GAUZE/BANDAGES/DRESSINGS) ×3 IMPLANT
GLOVE BIO SURGEON STRL SZ 6.5 (GLOVE) ×6 IMPLANT
GLOVE BIO SURGEON STRL SZ7.5 (GLOVE) ×3 IMPLANT
GLOVE BIO SURGEON STRL SZ8 (GLOVE) ×3 IMPLANT
GLOVE BIO SURGEONS STRL SZ 6.5 (GLOVE) ×3
GLOVE BIOGEL PI IND STRL 6.5 (GLOVE) ×3 IMPLANT
GLOVE BIOGEL PI IND STRL 8 (GLOVE) ×2 IMPLANT
GLOVE BIOGEL PI INDICATOR 6.5 (GLOVE) ×6
GLOVE BIOGEL PI INDICATOR 8 (GLOVE) ×4
GLOVE ECLIPSE 6.5 STRL STRAW (GLOVE) ×6 IMPLANT
GLOVE ECLIPSE 8.0 STRL XLNG CF (GLOVE) ×3 IMPLANT
GOWN BRE IMP PREV XXLGXLNG (GOWN DISPOSABLE) ×3 IMPLANT
GOWN STRL REUS W/ TWL LRG LVL3 (GOWN DISPOSABLE) ×5 IMPLANT
GOWN STRL REUS W/ TWL XL LVL3 (GOWN DISPOSABLE) ×2 IMPLANT
GOWN STRL REUS W/TWL LRG LVL3 (GOWN DISPOSABLE) ×10
GOWN STRL REUS W/TWL XL LVL3 (GOWN DISPOSABLE) ×4
HEMOSTAT SPONGE AVITENE ULTRA (HEMOSTASIS) IMPLANT
KIT BASIN OR (CUSTOM PROCEDURE TRAY) ×3 IMPLANT
KIT TURNOVER KIT B (KITS) ×3 IMPLANT
NS IRRIG 1000ML POUR BTL (IV SOLUTION) ×6 IMPLANT
PACK PERIPHERAL VASCULAR (CUSTOM PROCEDURE TRAY) ×3 IMPLANT
PAD ARMBOARD 7.5X6 YLW CONV (MISCELLANEOUS) ×6 IMPLANT
SPONGE LAP 18X18 X RAY DECT (DISPOSABLE) ×3 IMPLANT
STOPCOCK 4 WAY LG BORE MALE ST (IV SETS) ×3 IMPLANT
SUT ETHILON 2 0 FS 18 (SUTURE) ×3 IMPLANT
SUT MNCRL AB 4-0 PS2 18 (SUTURE) ×12 IMPLANT
SUT PROLENE 5 0 C 1 24 (SUTURE) ×9 IMPLANT
SUT PROLENE 6 0 BV (SUTURE) ×12 IMPLANT
SUT PROLENE 7 0 BV 1 (SUTURE) ×3 IMPLANT
SUT SILK 2 0 PERMA HAND 18 BK (SUTURE) ×3 IMPLANT
SUT SILK 2 0 SH (SUTURE) ×3 IMPLANT
SUT SILK 3 0 (SUTURE) ×6
SUT SILK 3-0 18XBRD TIE 12 (SUTURE) ×3 IMPLANT
SUT VIC AB 2-0 CT1 27 (SUTURE) ×8
SUT VIC AB 2-0 CT1 TAPERPNT 27 (SUTURE) ×4 IMPLANT
SUT VIC AB 3-0 SH 27 (SUTURE) ×10
SUT VIC AB 3-0 SH 27X BRD (SUTURE) ×5 IMPLANT
TOWEL GREEN STERILE (TOWEL DISPOSABLE) ×3 IMPLANT
TRAY FOLEY MTR SLVR 16FR STAT (SET/KITS/TRAYS/PACK) ×3 IMPLANT
TUBING EXTENTION W/L.L. (IV SETS) ×3 IMPLANT
UNDERPAD 30X30 (UNDERPADS AND DIAPERS) ×3 IMPLANT
WATER STERILE IRR 1000ML POUR (IV SOLUTION) ×3 IMPLANT

## 2019-04-22 NOTE — Transfer of Care (Signed)
Immediate Anesthesia Transfer of Care Note  Patient: Tye Vigo  Procedure(s) Performed: Right  FEMORAL-TIBIAL ARTERY BYPASS GRAFT with Harvest of Saphenous vein RIGHT SUPERFICIAL FEMORAL ARTERY TO TIBIOPERONEAL TRUNK BYPASS (Right Leg Upper)  Patient Location: PACU  Anesthesia Type:General  Level of Consciousness: confused  Airway & Oxygen Therapy: Patient Spontanous Breathing and Patient connected to nasal cannula oxygen  Post-op Assessment: Report given to RN, Post -op Vital signs reviewed and stable and Patient moving all extremities X 4  Post vital signs: Reviewed and stable  Last Vitals:  Vitals Value Taken Time  BP 119/78 04/22/19 1201  Temp    Pulse 88 04/22/19 1207  Resp 11 04/22/19 1207  SpO2 100 % 04/22/19 1207  Vitals shown include unvalidated device data.  Last Pain:  Vitals:   04/22/19 0412  TempSrc: Oral  PainSc:       Patients Stated Pain Goal: 0 (04/20/19 2123)  Complications: No apparent anesthesia complications

## 2019-04-22 NOTE — Progress Notes (Signed)
Pt arrived from PACU with large firm swollen area R thigh above knee.   MD notified & present for evaluation.   Compression wrap & JP drain in place/milked Site rechecked and noted decrease in size.  Dopplerable pulses.

## 2019-04-22 NOTE — Anesthesia Procedure Notes (Addendum)
Procedure Name: Intubation Date/Time: 04/22/2019 7:53 AM Performed by: Julian Reil, CRNA Pre-anesthesia Checklist: Patient identified, Emergency Drugs available, Suction available and Patient being monitored Patient Re-evaluated:Patient Re-evaluated prior to induction Oxygen Delivery Method: Circle system utilized Preoxygenation: Pre-oxygenation with 100% oxygen Induction Type: IV induction Ventilation: Mask ventilation without difficulty Laryngoscope Size: Miller and 3 Grade View: Grade II Tube type: Oral Tube size: 7.5 mm Number of attempts: 1 Airway Equipment and Method: Stylet Placement Confirmation: ETT inserted through vocal cords under direct vision,  positive ETCO2 and breath sounds checked- equal and bilateral Secured at: 23 cm Tube secured with: Tape Dental Injury: Teeth and Oropharynx as per pre-operative assessment  Comments: Very poor dentition pre-op, many loose and missing.

## 2019-04-22 NOTE — Progress Notes (Signed)
R leg doppler DP/PT/AT.  Cool to touch.  JP drain intact & milked x 4 with large clot noted in bulb following milking - 20 cc emptied @ 1845 Thomas [name]

## 2019-04-22 NOTE — Progress Notes (Signed)
[facility]  Subjective  - No complaints.   Objective (!) 157/88 99 98.3 F (36.8 C) (Oral) 18 100%  Intake/Output Summary (Last 24 hours) at 04/22/2019 0737 Last data filed at 04/22/2019 0300 Gross per 24 hour  Intake 2229.23 ml  Output 2350 ml  Net -120.77 ml    Right femoral pulse palpable Right toes 1-5 dry gangrene of tips  Laboratory Lab Results: Recent Labs    04/21/19 0533 04/22/19 0437  WBC 5.8 7.1  HGB 8.1* 8.9*  HCT 26.4* 29.6*  PLT 531* 550*   BMET Recent Labs    04/21/19 0533 04/22/19 0437  NA 139 138  K 3.8 4.0  CL 105 100  CO2 23 26  GLUCOSE 120* 116*  BUN <5* 7  CREATININE 0.67 0.77  CALCIUM 9.1 9.4    COAG Lab Results  Component Value Date   INR 1.0 04/22/2019   INR 1.3 (H) 04/13/2019   No results found for: PTT  Assessment/Planning:  Plan right lower extremity bypass for popliteal occlusion in setting of tissue loss with dry gangrene of right toes 1-5.  Risks and benefits discussed with patient.  Right external iliac stented earlier in the week.  [name] 04/22/2019 7:37 AM --

## 2019-04-22 NOTE — Progress Notes (Signed)
Patient ID: Thomas [name], male   DOB: September 13, 1975, 44 y.o.   MRN: [id number]         [facility]  Date of Admission:  04/13/2019           Day 9 vancomycin ASSESSMENT: I have spent quite a bit of time today reviewing his records from his recent hospitalization in [facility] and trying to determine why his infection was so persistent and why he relapsed so quickly following 2 months of IV antibiotic therapy.  I am most concerned about worsening thoracic spine infection particularly at the T9 level.  There is quite a bit of destruction of T9 and aspirates at that level upon this admission were still positive for MRSA despite prolonged antibiotic therapy.  I do not see that he has been evaluated by neurosurgery.  If not, I would strongly recommend neurosurgical consultation to help determine if he needs debridement and stabilization.  I am very worried that he has compromised blood flow to that area making it difficult to cure his infection.  I will also go ahead and boost his antibiotic therapy back to the combination of ceftaroline and daptomycin.  PLAN: 1. Recommend neurosurgical consultation 2. Change vancomycin to ceftaroline and daptomycin  Principal Problem:   MRSA bacteremia Active Problems:   Osteomyelitis of spine (HCC)   Gangrene of toe of right foot (HCC)   Discitis of thoracolumbar region   Endocarditis of mitral valve   Diabetes mellitus type II, uncontrolled (HCC)   HTN (hypertension)   Rectal mass   DVT of popliteal vein (HCC)   Scheduled Meds: . atorvastatin  10 mg Oral q1800  . Chlorhexidine Gluconate Cloth  6 each Topical Daily  . clopidogrel  75 mg Oral Q breakfast  . [START ON 04/23/2019] docusate sodium  100 mg Oral Daily  . fentaNYL      . insulin aspart  0-9 Units Subcutaneous TID WC  . insulin glargine  15 Units Subcutaneous QHS  . [START ON 04/23/2019] pantoprazole  40 mg Oral Daily  . pneumococcal 23 valent vaccine  0.5 mL  Intramuscular Tomorrow-1000  . saccharomyces boulardii  250 mg Oral BID  . senna-docusate  2 tablet Oral BID  . sodium chloride flush  10-40 mL Intracatheter Q12H  . sodium chloride flush  3 mL Intravenous Once  . sodium chloride flush  3 mL Intravenous Q12H   Continuous Infusions: . sodium chloride 250 mL (04/13/19 2351)  . sodium chloride Stopped (04/13/19 2230)  . sodium chloride    . sodium chloride    . sodium chloride    .  ceFAZolin (ANCEF) IV    . ceFTAROline (TEFLARO) IV    . DAPTOmycin (CUBICIN)  IV    . lactated ringers 100 mL/hr at 04/21/19 0400  . magnesium sulfate bolus IVPB    . sodium chloride     PRN Meds:.sodium chloride, sodium chloride, sodium chloride, sodium chloride, acetaminophen, acetaminophen, guaiFENesin-dextromethorphan, hydrALAZINE, HYDROmorphone (DILAUDID) injection, ibuprofen, labetalol, magnesium sulfate bolus IVPB, metoprolol tartrate, ondansetron **OR** ondansetron (ZOFRAN) IV, oxyCODONE, phenol, potassium chloride, simethicone, sodium chloride flush, sodium chloride flush   SUBJECTIVE: Thomas [name] is a 62 year old diabetic who had a prolonged hospitalization at [facility] recently (October 18-November 5). He was admitted with DKA and was found to have MRSA bacteremia with widely disseminated infection. 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 He was persistently bacteremic on vancomycin until 02/21/2019. He was switched to ceftaroline plus daptomycin. He underwent I&D of his left and right leg abscesses on 02/05/2019 and cultures were positive for MRSA. He had repeat I&D on his right lower leg on 03/04/2019 and cultures were negative. He was discharged on 03/09/2019 on daptomycin alone. He continued that at the outpatient infusion center in [facility] through 04/04/2019.  Within 1 week of stopping antibiotics he began  having increasing back pain and darkening of the first second and third toes on his right foot leading to readmission here on 04/13/2019. Blood cultures were positive for MRSA again. MRI showed progressive infection at the T9 level with new discitis at T8 and T10.  MRI of his right foot raised concern for possible osteomyelitis in his distal right second toe.  He underwent TEE which did not show any vegetations or other evidence of persistent endocarditis.He is only on vancomycin now. Repeat blood cultures are negative at a little over 48 hours. He says that his back pain is severe but no worse than it was upon admission.    He underwent vascular bypass of his right leg today.  He has a small hematoma at one of his incision sites but is doing well.  Review of Systems: Review of Systems  Constitutional: Positive for malaise/fatigue. Negative for fever.  Respiratory: Negative for cough and shortness of breath.   Cardiovascular: Negative for chest pain.  Musculoskeletal: Positive for back pain and joint pain.    No Known Allergies  OBJECTIVE: Vitals:   04/22/19 1445 04/22/19 1446 04/22/19 1459 04/22/19 1500  BP:  132/86    Pulse: 96 96 98   Resp: '17 16 16   ' Temp:    98.5 F (36.9 C)  TempSrc:      SpO2: 100% 100% 100%   Weight:      Height:       Body mass index is 27.79 kg/m.  Physical Exam Constitutional:      Comments: He is resting quietly in bed.  A close friend is at the bedside.  He requested that his friend be used as his interpreter today.  Cardiovascular:     Rate and Rhythm: Normal rate and regular rhythm.     Heart sounds: No murmur.  Pulmonary:     Effort: Pulmonary effort is normal.     Breath sounds: Normal breath sounds.  Abdominal:     Palpations: Abdomen is soft.     Tenderness: There is no abdominal tenderness.  Musculoskeletal:        General: No swelling or tenderness.     Comments: He has some dry gangrene of his right first, second and third toes.  This  is unchanged.  I do not find any evidence of persistent infection in his left thigh or right calf.  He has an Ace wrap and new surgical drains in his right leg.  Skin:    Findings: No rash.     Comments: New PICC in place.     Lab Results Lab Results  Component Value Date   WBC 7.1 04/22/2019   HGB 8.9 (L) 04/22/2019   HCT 29.6 (L) 04/22/2019   MCV 80.0 04/22/2019   PLT 550 (H) 04/22/2019    Lab Results  Component Value Date   CREATININE 0.77 04/22/2019   BUN 7 04/22/2019   NA 138 04/22/2019   K 4.0 04/22/2019   CL 100 04/22/2019   CO2 26 04/22/2019  Lab Results  Component Value Date   ALT 25 04/13/2019   AST 14 (L) 04/13/2019   ALKPHOS 84 04/13/2019   BILITOT 0.4 04/13/2019     Microbiology: Recent Results (from the past 240 hour(s))  Blood culture (routine x 2)     Status: Abnormal   Collection Time: 04/13/19 12:45 PM   Specimen: BLOOD  Result Value Ref Range Status   Specimen Description   Final    BLOOD RIGHT ANTECUBITAL Performed at [facility], [address] [place], [place] [address]    Special Requests   Final    BOTTLES DRAWN AEROBIC AND ANAEROBIC Blood Culture adequate volume Performed at [facility], [address] [place], [place] [address]    Culture  Setup Time   Final    GRAM POSITIVE COCCI IN CLUSTERS AEROBIC BOTTLE ONLY CRITICAL RESULT CALLED TO, READ BACK BY AND VERIFIED WITH: M. [place], AT 3005 04/15/19 BY D. VANHOOK    Culture (A)  Final    STAPHYLOCOCCUS AUREUS SUSCEPTIBILITIES PERFORMED ON PREVIOUS CULTURE WITHIN THE LAST 5 DAYS. Performed at [facility], [place] [address] [place], [place] [address]    Report Status 04/16/2019 FINAL  Final  Blood Culture ID Panel (Reflexed)     Status: Abnormal   Collection Time: 04/13/19 12:45 PM  Result Value Ref Range Status   Enterococcus species NOT DETECTED NOT DETECTED Final   Listeria monocytogenes NOT DETECTED NOT DETECTED Final   Staphylococcus  species DETECTED (A) NOT DETECTED Final    Comment: CRITICAL RESULT CALLED TO, READ BACK BY AND VERIFIED WITH: M. [place], AT 1173 04/15/19 BY D. VANHOOK    Staphylococcus aureus (BCID) DETECTED (A) NOT DETECTED Final    Comment: Methicillin (oxacillin)-resistant Staphylococcus aureus (MRSA). MRSA is predictably resistant to beta-lactam antibiotics (except ceftaroline). Preferred therapy is vancomycin unless clinically contraindicated. Patient requires contact precautions if  hospitalized. CRITICAL RESULT CALLED TO, READ BACK BY AND VERIFIED WITH: [place], AT [phone] 04/15/19 BY D. VANHOOK    Methicillin resistance DETECTED (A) NOT DETECTED Final    Comment: CRITICAL RESULT CALLED TO, READ BACK BY AND VERIFIED WITH: Hughie Closs PHARMD, AT [phone] 04/15/19 BY D. VANHOOK    Streptococcus species NOT DETECTED NOT DETECTED Final   Streptococcus agalactiae NOT DETECTED NOT DETECTED Final   Streptococcus pneumoniae NOT DETECTED NOT DETECTED Final   Streptococcus pyogenes NOT DETECTED NOT DETECTED Final   Acinetobacter baumannii NOT DETECTED NOT DETECTED Final   Enterobacteriaceae species NOT DETECTED NOT DETECTED Final   Enterobacter cloacae complex NOT DETECTED NOT DETECTED Final   Escherichia coli NOT DETECTED NOT DETECTED Final   Klebsiella oxytoca NOT DETECTED NOT DETECTED Final   Klebsiella pneumoniae NOT DETECTED NOT DETECTED Final   Proteus species NOT DETECTED NOT DETECTED Final   Serratia marcescens NOT DETECTED NOT DETECTED Final   Haemophilus influenzae NOT DETECTED NOT DETECTED Final   Neisseria meningitidis NOT DETECTED NOT DETECTED Final   Pseudomonas aeruginosa NOT DETECTED NOT DETECTED Final   Candida albicans NOT DETECTED NOT DETECTED Final   Candida glabrata NOT DETECTED NOT DETECTED Final   Candida krusei NOT DETECTED NOT DETECTED Final   Candida parapsilosis NOT DETECTED NOT DETECTED Final   Candida tropicalis NOT DETECTED NOT DETECTED Final    Comment:  Performed at [facility], [place]. [address] [place], [place] [address]  SARS Coronavirus 2 Ag (30 min TAT) - Nasal Swab (BD Veritor Kit)  Status: None   Collection Time: 04/13/19 12:52 PM   Specimen: Nasal Swab (BD Veritor Kit)  Result Value Ref Range Status   SARS Coronavirus 2 Ag NEGATIVE NEGATIVE Final    Comment: (NOTE) SARS-CoV-2 antigen NOT DETECTED.  Negative results are presumptive.  Negative results do not preclude SARS-CoV-2 infection and should not be used as the sole basis for treatment or other patient management decisions, including infection  control decisions, particularly in the presence of clinical signs and  symptoms consistent with COVID-19, or in those who have been in contact with the virus.  Negative results must be combined with clinical observations, patient history, and epidemiological information. The expected result is Negative. Fact Sheet for Patients: PodPark.tn Fact Sheet for Healthcare Providers: GiftContent.is This test is not yet approved or cleared by the [place] FDA and  has been authorized for detection and/or diagnosis of SARS-CoV-2 by FDA under an Emergency Use Authorization (EUA).  This EUA will remain in effect (meaning this test can be used) for the duration of  the COVID-19 de claration under Section 564(b)(1) of the Act, 21 U.S.C. section 360bbb-3(b)(1), unless the authorization is terminated or revoked sooner. Performed at [facility], [address] [place], [place] [address]   Blood culture (routine x 2)     Status: Abnormal   Collection Time: 04/13/19 12:56 PM   Specimen: BLOOD  Result Value Ref Range Status   Specimen Description   Final    BLOOD LEFT ANTECUBITAL Performed at [facility], [address] [place], [place] [address]    Special Requests   Final    BOTTLES DRAWN AEROBIC AND ANAEROBIC Blood Culture adequate  volume Performed at [facility], [address] [place], [place] [address]    Culture  Setup Time   Final    GRAM POSITIVE COCCI IN BOTH AEROBIC AND ANAEROBIC BOTTLES CRITICAL RESULT CALLED TO, READ BACK BY AND VERIFIED WITH: PHARND J Iran 123020 AT 950 AM BY CM Performed at [facility], [place] [address], [place], [place] [address]    Culture METHICILLIN RESISTANT STAPHYLOCOCCUS AUREUS (A)  Final   Report Status 04/16/2019 FINAL  Final   Organism ID, Bacteria METHICILLIN RESISTANT STAPHYLOCOCCUS AUREUS  Final      Susceptibility   Methicillin resistant staphylococcus aureus - MIC*    CIPROFLOXACIN >=8 RESISTANT Resistant     ERYTHROMYCIN >=8 RESISTANT Resistant     GENTAMICIN <=0.5 SENSITIVE Sensitive     OXACILLIN >=4 RESISTANT Resistant     TETRACYCLINE <=1 SENSITIVE Sensitive     VANCOMYCIN <=0.5 SENSITIVE Sensitive     TRIMETH/SULFA <=10 SENSITIVE Sensitive     CLINDAMYCIN >=8 RESISTANT Resistant     RIFAMPIN <=0.5 SENSITIVE Sensitive     Inducible Clindamycin NEGATIVE Sensitive     * METHICILLIN RESISTANT STAPHYLOCOCCUS AUREUS  SARS CORONAVIRUS 2 (TAT 6-24 HRS) Nasopharyngeal Nasopharyngeal Swab     Status: None   Collection Time: 04/13/19  3:31 PM   Specimen: Nasopharyngeal Swab  Result Value Ref Range Status   SARS Coronavirus 2 NEGATIVE NEGATIVE Final    Comment: (NOTE) SARS-CoV-2 target nucleic acids are NOT DETECTED. The SARS-CoV-2 RNA is generally detectable in upper and lower respiratory specimens during the acute phase of infection. Negative results do not preclude SARS-CoV-2 infection, do not rule out co-infections with other pathogens, and should not be used as the sole basis for treatment or other patient management decisions. Negative results must  be combined with clinical observations, patient history, and epidemiological information. The expected result is Negative. Fact Sheet for Patients: SugarRoll.be Fact  Sheet for Healthcare Providers: https://www.woods-mathews.com/ This test is not yet approved or cleared by the [place] FDA and  has been authorized for detection and/or diagnosis of SARS-CoV-2 by FDA under an Emergency Use Authorization (EUA). This EUA will remain  in effect (meaning this test can be used) for the duration of the COVID-19 declaration under Section 56 4(b)(1) of the Act, 21 U.S.C. section 360bbb-3(b)(1), unless the authorization is terminated or revoked sooner. Performed at [facility], [place] [address] [place], [place] [address]   Urine culture     Status: None   Collection Time: 04/13/19  7:45 PM   Specimen: Urine, Random  Result Value Ref Range Status   Specimen Description   Final    URINE, RANDOM Performed at [facility], [address] [place], [place] [address]    Special Requests   Final    NONE Performed at [facility], [address] [place], [place] [address]    Culture   Final    NO GROWTH Performed at [facility], [place] [address] [place], [place] [address]    Report Status 04/14/2019 FINAL  Final  Surgical pcr screen     Status: Abnormal   Collection Time: 04/14/19  4:42 AM   Specimen: Nasal Mucosa; Nasal Swab  Result Value Ref Range Status   MRSA, PCR POSITIVE (A) NEGATIVE Final    Comment: RESULT CALLED TO, READ BACK BY AND VERIFIED WITH: C JOHNSON ON 123020 AT 1143 BY NFIELDS    Staphylococcus aureus POSITIVE (A) NEGATIVE Final    Comment: (NOTE) The Xpert SA Assay (FDA approved for NASAL specimens in patients 108 years of age and older), is one component of a comprehensive surveillance program. It is not intended to diagnose infection nor to guide or monitor treatment. Performed at [facility], [place] [address] [place], [place] [address]   Culture, blood (routine x 2)     Status: Abnormal   Collection Time: 04/14/19 12:31 PM   Specimen: BLOOD RIGHT HAND  Result Value Ref  Range Status   Specimen Description BLOOD RIGHT HAND  Final   Special Requests   Final    BOTTLES DRAWN AEROBIC AND ANAEROBIC Blood Culture adequate volume   Culture  Setup Time   Final    GRAM POSITIVE COCCI ANAEROBIC BOTTLE ONLY CRITICAL RESULT CALLED TO, READ BACK BY AND VERIFIED WITH: [place], PHARMD AT 1324 ON 04/18/19 BY C. JESSUP, MT. Performed at [facility], [place] [address], [place], [place] [address]    Culture METHICILLIN RESISTANT STAPHYLOCOCCUS AUREUS (A)  Final   Report Status 04/20/2019 FINAL  Final   Organism ID, Bacteria METHICILLIN RESISTANT STAPHYLOCOCCUS AUREUS  Final      Susceptibility   Methicillin resistant staphylococcus aureus - MIC*    CIPROFLOXACIN >=8 RESISTANT Resistant     ERYTHROMYCIN >=8 RESISTANT Resistant     GENTAMICIN <=0.5 SENSITIVE Sensitive     OXACILLIN >=4 RESISTANT Resistant     TETRACYCLINE <=1 SENSITIVE Sensitive     VANCOMYCIN <=0.5 SENSITIVE Sensitive     TRIMETH/SULFA <=10 SENSITIVE Sensitive     CLINDAMYCIN >=8 RESISTANT Resistant     RIFAMPIN <=0.5 SENSITIVE Sensitive     Inducible Clindamycin NEGATIVE Sensitive     * METHICILLIN RESISTANT STAPHYLOCOCCUS AUREUS  Culture, blood (routine  x 2)     Status: None   Collection Time: 04/14/19 12:46 PM   Specimen: BLOOD LEFT HAND  Result Value Ref Range Status   Specimen Description BLOOD LEFT HAND  Final   Special Requests   Final    BOTTLES DRAWN AEROBIC ONLY Blood Culture results may not be optimal due to an inadequate volume of blood received in culture bottles   Culture   Final    NO GROWTH 5 DAYS Performed at [facility], [place] [address], [place], [place] [address]    Report Status 04/19/2019 FINAL  Final  Aerobic/Anaerobic Culture (surgical/deep wound)     Status: None (Preliminary result)   Collection Time: 04/15/19  1:45 PM   Specimen: A: PATH Bone biopsy   B: PATH Other; Abscess  Result Value Ref Range Status   Specimen Description BONE BIOPSY T9  Final    Special Requests NONE  Final   Gram Stain   Final    RARE WBC PRESENT, PREDOMINANTLY MONONUCLEAR RARE GRAM POSITIVE COCCI    Culture   Final    RARE METHICILLIN RESISTANT STAPHYLOCOCCUS AUREUS NO ANAEROBES ISOLATED CULTURE REINCUBATED FOR BETTER GROWTH Performed at [facility], [place] [address] [place], [place] [address]    Report Status PENDING  Incomplete   Organism ID, Bacteria METHICILLIN RESISTANT STAPHYLOCOCCUS AUREUS  Final      Susceptibility   Methicillin resistant staphylococcus aureus - MIC*    CIPROFLOXACIN >=8 RESISTANT Resistant     ERYTHROMYCIN >=8 RESISTANT Resistant     GENTAMICIN <=0.5 SENSITIVE Sensitive     OXACILLIN >=4 RESISTANT Resistant     TETRACYCLINE <=1 SENSITIVE Sensitive     VANCOMYCIN 2 SENSITIVE Sensitive     TRIMETH/SULFA <=10 SENSITIVE Sensitive     CLINDAMYCIN >=8 RESISTANT Resistant     RIFAMPIN <=0.5 SENSITIVE Sensitive     Inducible Clindamycin NEGATIVE Sensitive     * RARE METHICILLIN RESISTANT STAPHYLOCOCCUS AUREUS  Aerobic/Anaerobic Culture (surgical/deep wound)     Status: None   Collection Time: 04/15/19  1:53 PM   Specimen: Abscess; Tissue  Result Value Ref Range Status   Specimen Description ABSCESS T9 DISC ASPIRATION 2  Final   Special Requests Normal  Final   Gram Stain NO WBC SEEN RARE GRAM POSITIVE COCCI IN PAIRS   Final   Culture   Final    RARE METHICILLIN RESISTANT STAPHYLOCOCCUS AUREUS NO ANAEROBES ISOLATED Performed at [facility], [address], [place], [place] [address]    Report Status 04/20/2019 FINAL  Final   Organism ID, Bacteria METHICILLIN RESISTANT STAPHYLOCOCCUS AUREUS  Final      Susceptibility   Methicillin resistant staphylococcus aureus - MIC*    CIPROFLOXACIN >=8 RESISTANT Resistant     ERYTHROMYCIN >=8 RESISTANT Resistant     GENTAMICIN <=0.5 SENSITIVE Sensitive     OXACILLIN >=4 RESISTANT Resistant     TETRACYCLINE <=1 SENSITIVE Sensitive     VANCOMYCIN <=0.5 SENSITIVE Sensitive      TRIMETH/SULFA <=10 SENSITIVE Sensitive     CLINDAMYCIN >=8 RESISTANT Resistant     RIFAMPIN <=0.5 SENSITIVE Sensitive     Inducible Clindamycin NEGATIVE Sensitive     * RARE METHICILLIN RESISTANT STAPHYLOCOCCUS AUREUS  Aerobic/Anaerobic Culture (surgical/deep wound)     Status: None   Collection Time: 04/15/19  1:53 PM   Specimen: Abscess  Result Value Ref Range Status   Specimen Description ABSCESS T9 DISC ASPIRATION  Final   Special Requests Normal  Final   Gram Stain NO WBC SEEN NO ORGANISMS SEEN   Final   Culture   Final    RARE METHICILLIN RESISTANT STAPHYLOCOCCUS AUREUS NO ANAEROBES ISOLATED Performed at [facility], [place] [address] [place], [place] [address]    Report Status 04/20/2019 FINAL  Final   Organism ID, Bacteria METHICILLIN RESISTANT STAPHYLOCOCCUS AUREUS  Final      Susceptibility   Methicillin resistant staphylococcus aureus - MIC*    CIPROFLOXACIN >=8 RESISTANT Resistant     ERYTHROMYCIN >=8 RESISTANT Resistant     GENTAMICIN <=0.5 SENSITIVE Sensitive     OXACILLIN >=4 RESISTANT Resistant     TETRACYCLINE <=1 SENSITIVE Sensitive     VANCOMYCIN 1 SENSITIVE Sensitive     TRIMETH/SULFA <=10 SENSITIVE Sensitive     CLINDAMYCIN >=8 RESISTANT Resistant     RIFAMPIN <=0.5 SENSITIVE Sensitive     Inducible Clindamycin NEGATIVE Sensitive     * RARE METHICILLIN RESISTANT STAPHYLOCOCCUS AUREUS  Culture, blood (routine x 2)     Status: None (Preliminary result)   Collection Time: 04/19/19  3:06 PM   Specimen: BLOOD  Result Value Ref Range Status   Specimen Description BLOOD ASSIST CONTROL  Final   Special Requests   Final    BOTTLES DRAWN AEROBIC ONLY Blood Culture adequate volume   Culture   Final    NO GROWTH 3 DAYS Performed at [facility], [address] [place], [place] [address]    Report Status PENDING  Incomplete  Culture, blood (routine x 2)     Status: None (Preliminary result)   Collection Time: 04/19/19  3:06 PM   Specimen: BLOOD LEFT  HAND  Result Value Ref Range Status   Specimen Description BLOOD LEFT HAND  Final   Special Requests   Final    BOTTLES DRAWN AEROBIC ONLY Blood Culture adequate volume   Culture   Final    NO GROWTH 3 DAYS Performed at [facility], [place] [address], [place], [place] [address]    Report Status PENDING  Incomplete  Surgical pcr screen     Status: None   Collection Time: 04/22/19  4:06 AM   Specimen: Nasal Mucosa; Nasal Swab  Result Value Ref Range Status   MRSA, PCR NEGATIVE NEGATIVE Final   Staphylococcus aureus NEGATIVE NEGATIVE Final    Comment: (NOTE) The Xpert SA Assay (FDA approved for NASAL specimens in patients 38 years of age and older), is one component of a comprehensive surveillance program. It is not intended to diagnose infection nor to guide or monitor treatment. Performed at [facility], [place] [address], [place], [place] [address]     Michel Bickers, [place] [facility] [phone] pager   [phone] cell 04/22/2019, 5:20 PM

## 2019-04-22 NOTE — Op Note (Signed)
Date: April 22, 2019  Preoperative diagnosis: Critical limb ischemia of the right lower extremity with tissue loss including dry gangrene of toes 1 through 5   Postoperative diagnosis: Same  Procedure: 1.  Harvest of right great saphenous vein 2.  Right above-knee popliteal artery to tibioperoneal trunk bypass with reversed ipsilateral great saphenous vein 3.  Placement of 15 French round Blake drain in the right popliteal space  Surgeon: Dr. Marty Heck, MD  Assistant: Leontine Locket, PA and Risa Grill, [place]  Indication: Patient is a 44 year old male with complex medical history including prolonged complicated hospitalization for endocarditis and MRSA bacteremia.  Ultimately vascular surgery was consulted for ischemic dry gangrene to the right first through fifth toes.  He underwent arteriogram earlier in the week with evidence of multilevel disease.  A right external iliac stent was placed for high-grade stenosis.  He subsequently had a below-knee popliteal artery occlusion and reconstituted a very diseased tibioperoneal trunk and two-vessel runoff via posterior tibial and peroneal arteries.  Given his young age of 44 we recommended bypass after risk and benefits were discussed.  Findings: Excellent saphenous vein greater than 3.5 to 4 mm that dilated very nicely.  Ultimately bypass was performed from the above-knee popliteal artery to the tibioperoneal trunk and tunneled in the popliteal space subfascial.  There was excellent posterior tibial signal upon completion of the case.  The tibioperoneal trunk was heavily diseased and calcified but I was able to find a soft spot to perform the anastomosis using a tourniquet for proximal control.  Anesthesia: General  EBL: 200 mL  Details: Patient was taken to the operating room after informed consent was obtained.  He was placed on operative table in supine position.  Right leg was then prepped and draped in usual sterile fashion after  general endotracheal anesthesia was induced.  I had already marked his saphenous vein in the right thigh prior to his leg being sterilely prepped and draped.  Timeout was performed and preoperative antibiotics were administered.  Initially started by making three skip incisions on the right upper thigh and the great saphenous vein was subsequently harvested.  All side branches were ligated between 3-0 and 4-0 silk ties and divided.  Ultimately this was mobilized all the way up to the saphenofemoral junction where it was ligated with a 2-0 silk tie and divided and then harvested all the way down to the just above the knee.  Ultimately this was reversed and vessel cannula placed and it dilated very nicely.  Several 6-0 Prolene's had to be placed in the vein for repair.  That point in time I turned my attention to above-knee popliteal artery and a longitudinal incision was made just anterior to the border of the sartorius.  Ultimately dissected down to the above-knee popliteal space and the artery was then mobilized off the veins with Bovie cautery Vesseloops were placed placed proximally distally.  There was an excellent pulse here and distally could feel a hard calcified plaque where the artery was occluded.  I then went and placed a longitudinal incision one fingerbreadth medial to the tibia on the calf.  Ultimately Bovie cautery was used to open the subcutaneous tissue and the fascia and into the popliteal space.  The popliteal artery was then circumferentially dissected off the popliteal veins and I then ended up carry our dissection caudal and took down some of the soleus muscle in order to expose the anterior tibial vein and anterior tibial artery.  The anterior tibial  vein was ligated between 3-0 silk ties and divided and also put medium vessel clips.  I continue to mobilize distally until I identified the tibioperoneal trunk and this was circumferentially mobilized all the way down to the peroneal and  posterior tibial artery bifurcation.  Several other vein branches were ligated between 3-0 silk ties and divided.  That point in time I tunneled with my fingers bluntly in the popliteal space and used a short straight tunneler and the vein was reversed and passed through the tunnel.  Patient was given 100 units/kg heparin.  ACT was checked to maintain is greater than 250 and additional heparin was given.  I used my Vesseloops for proximal distal control in the above-knee popliteal artery and after the heparin circulated the artery was opened with 11 blade scalpel extended with Potts scissors.  The vein was spatulated and end-to-side anastomosis performed with 5-0 Prolene in parachute format.  There was excellent pulsatile flow in the vein bypass.  I then straighten the leg and measured the appropriate vein length for the tibioperoneal trunk anastomosis.  The vein was then marked and cut at the appropriate length.  Given that the tibial arteries were heavily calcified and I did not think I could get control distally with a vessel loop.  I then exsanguinated the right leg with Esmarch retractor and placed a tourniquet on the upper thigh.  The tourniquet was then inflated 250 mmHg.  I then open the tibioperoneal trunk with 11 blade scalpel extended with Potts scissors.  There was a very large calcified plaque lateral and posterior but I was able to identify the true lumen and I did not perform endarterectomy given that I did not think I could find a safe endpoint and the artery was heavily circumferentially calcified distally.  That point in time a end-to-side anastomosis was performed with 6-0 Prolene onto the tibial peroneal trunk once the vein was spatulated.  We did come down on our tourniquet prior to completing anastomosis and the tibial arteries as well as the graft were de-aired.  I placed one additional patch stitch had to be placed in the distal and proximal anastomosis with 6-0 Prolene.  We checked the foot  there was excellent posterior tibial signal.  50 mg protamine was given for reversal.  Given that he was on Plavix after a right iliac stent was placed earlier in the week soft tissue was fairly oozy.  I did elect to place a drain in the popliteal space.  15 [place] round Blake drain was tunneled out the proximal calf medially and the drain was placed into the popliteal space.  This was secured with a 3-0 nylon suture.  That point time all the incisions were irrigated.  The saphenectomy incisions were closed with 3-0 Vicryl, 4-0 Monocryl in the skin, and Dermabond.  The above and below-knee target incisions were closed with 2-0 Vicryl, 3-0 Vicryl, 4-0 Monocryl in the skin and Dermabond.  He had a very good posterior tibial signal upon completion and was taken to PACU in stable condition.  Complication: None  Condition: Stable  [name], MD [facility] Office: [phone]   [name]

## 2019-04-22 NOTE — Progress Notes (Signed)
PROGRESS NOTE  Thomas [name] XAJ:[id number] DOB: October 19, 1975 DOA: 04/13/2019 PCP: [name], PA-C  Brief History   Thomas Mohammedis a 44 y.o.malewith medical history significant ofDM2 poorly controlled.  Recent hospitalization at [facility] for disseminated MRSA bacteremia, osteomyelitis and discitis.  Completed 6 weeks course of IV antibiotics on April 04, 2019.  Source of bacteremia thought to be from skin source (per previous facility report, area on toe that appeared cellulitic, area in R back that was erythematous and an abrasion of the lateral R aspect of his shin, abscess formation in the R calf and pyomyositis of the L vastus intermedius).  Developed necrosis of his R 1-5 digits since his discharge.    Presented here with worsening back pain, generalized weakness, fever and recent fall.  CT chest, abdomen and pelvis notable for findings of advanced osteomyelitis involving T9 as well as discitis.  Positive blood cultures for MRSA.  Seen by infectious disease, recommendations for IV Vancomycin.  Planned TEE on Monday.  Vascular surgery following for right 1st-5th toe dry gangrene. However, he wants to wait until current infection in controlled before proceeding with aortogram etc to address PAD and dry gangrene.  Pt underwent TEE today which did not demonstrate vegetations or other evidence of persistent endocarditis.   He was also taken to the vascular lab by Dr. Chestine Spore for Aortogram, bilateral lower extremity arteriogram with runoff, right external iliac artery angioplasty with stent placement under moderate conscious sedation. This was performed critical ischemia of the right lower extremity with gangrene of digits 1-5 of the right foot. Dr. Chestine Spore performed a right SFA to TP trunk bypass for popliteal occlusion in addition to iliac lesion on 04/22/2019. The patient has tolerated this procedure well.  Consultants  . Vascular surgery . Interventional  radiology . Infectious disease . Wound Care  Procedures  . Aspiration of T9 to guide further therapy . Angiogram of bilateral lower extremities with stent placement in Rt EIA 04/19/2019.  Antibiotics   Anti-infectives (From admission, onward)   Start     Dose/Rate Route Frequency Ordered Stop   04/22/19 1600  ceFAZolin (ANCEF) IVPB 2g/100 mL premix     2 g 200 mL/hr over 30 Minutes Intravenous Every 8 hours 04/22/19 1525 04/23/19 0759   04/22/19 0600  ceFAZolin (ANCEF) IVPB 2g/100 mL premix    Note to Pharmacy: Send with pt to OR   2 g 200 mL/hr over 30 Minutes Intravenous To Short Stay 04/21/19 0743 04/22/19 0813   04/18/19 1800  vancomycin (VANCOCIN) IVPB 1000 mg/200 mL premix     1,000 mg 200 mL/hr over 60 Minutes Intravenous Every 12 hours 04/18/19 0718     04/14/19 1600  vancomycin (VANCOREADY) IVPB 1500 mg/300 mL  Status:  Discontinued     1,500 mg 150 mL/hr over 120 Minutes Intravenous Every 12 hours 04/14/19 1240 04/18/19 0718   04/13/19 2200  ceFEPIme (MAXIPIME) 2 g in sodium chloride 0.9 % 100 mL IVPB  Status:  Discontinued     2 g 200 mL/hr over 30 Minutes Intravenous Every 8 hours 04/13/19 1428 04/14/19 1028   04/13/19 1430  vancomycin (VANCOCIN) IVPB 1000 mg/200 mL premix  Status:  Discontinued     1,000 mg 200 mL/hr over 60 Minutes Intravenous Every 12 hours 04/13/19 1428 04/14/19 1240   04/13/19 1415  ceFEPIme (MAXIPIME) 2 g in sodium chloride 0.9 % 100 mL IVPB     2 g 200 mL/hr over 30 Minutes Intravenous  Once 04/13/19 1411 04/13/19  1505   04/13/19 1415  metroNIDAZOLE (FLAGYL) IVPB 500 mg     500 mg 100 mL/hr over 60 Minutes Intravenous  Once 04/13/19 1411 04/13/19 1600   04/13/19 1415  vancomycin (VANCOCIN) IVPB 1000 mg/200 mL premix  Status:  Discontinued     1,000 mg 200 mL/hr over 60 Minutes Intravenous  Once 04/13/19 1411 04/13/19 1428      Subjective  The patient is resting comfortably. No new complaints.  Objective   Vitals:  Vitals:   04/22/19  1459 04/22/19 1500  BP:    Pulse: 98   Resp: 16   Temp:  98.5 F (36.9 C)  SpO2: 100%    Exam:  Constitutional:  . The patient is awake, alert, and oriented x 3. No acute distress. Respiratory:  . No increased work of breathing. . No wheezes, rales, or rhonchi . No tactile fremitus Cardiovascular:  . Regular rate and rhythm . No murmurs, ectopy, or gallups. . No lateral PMI. No thrills. Abdomen:  . Abdomen is soft, non-tender, non-distended . No hernias, masses, or organomegaly . Normoactive bowel sounds.  Musculoskeletal:  . No cyanosis or clubbing . 2-3+ pitting edema of lower extremities bilaterally . All 5 digits on right foot are blackened with dry gangrene. Skin:  . No rashes, lesions, ulcers . palpation of skin: no induration or nodules . Blackening of distal aspect of 5 digits on right foot. Neurologic:  . CN 2-12 intact . Sensation all 4 extremities intact Psychiatric:  . Mental status o Mood, affect appropriate o Orientation to person, place, time  . judgment and insight appear intact  I have personally reviewed the following:   Today's Data  . Orthoptist  . No growth of surveillance cultures.  Scheduled Meds: . atorvastatin  10 mg Oral q1800  . Chlorhexidine Gluconate Cloth  6 each Topical Daily  . clopidogrel  75 mg Oral Q breakfast  . [START ON 04/23/2019] docusate sodium  100 mg Oral Daily  . fentaNYL      . insulin aspart  0-9 Units Subcutaneous TID WC  . insulin glargine  15 Units Subcutaneous QHS  . [START ON 04/23/2019] pantoprazole  40 mg Oral Daily  . pneumococcal 23 valent vaccine  0.5 mL Intramuscular Tomorrow-1000  . saccharomyces boulardii  250 mg Oral BID  . senna-docusate  2 tablet Oral BID  . sodium chloride flush  10-40 mL Intracatheter Q12H  . sodium chloride flush  3 mL Intravenous Once  . sodium chloride flush  3 mL Intravenous Q12H   Continuous Infusions: . sodium chloride 250 mL (04/13/19 2351)  . sodium chloride  Stopped (04/13/19 2230)  . sodium chloride    . sodium chloride    . sodium chloride    .  ceFAZolin (ANCEF) IV    . lactated ringers 100 mL/hr at 04/21/19 0400  . magnesium sulfate bolus IVPB    . 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 Infectious  disease has been consulted, recommendation for IV vancomycin for now. Repeated blood cultures on 04/14/2019 - to date. Continue to follow cultures. MRI independently reviewed showed progressive T9 osteomyelitis with increased cultures evaluation and vertebral body collapse.  New discitis at T8-9 and T9-T10 with T8 and T10 osteomyelitis.  Moderate to severe spinal stenosis at T8-T9. Persistent extensive anterior paravertebral soft tissue inflammation/phlegmon. Decreased posterior paraspinal inflammation without residual or new abscess. Cardiology has been consulted for TEE to assess for endocarditis. This was performed on 04/18/2018 and demonstrated no vegetations or evidence of persistent endocarditis. Pain management in place. Continue bowel regimen.  MRSA bacteremia: Management as stated above. Currently afebrile with no leukocytosis. Continue to monitor WBC and fever curve.  T9 abscess status post aspiration by interventional radiology on 04/15/2019: Deep tissue culture growing staph aureus, continue to monitor cultures. Continue pain management and IV antibiotics.  Recurrent thoracic vertebral osteomyelitis: Previously treated with 6 weeks of IV antibiotics with IV  daptomycin on 04/04/2019. Significantly elevated CRP 19.4 and sed rate >140. Management as per above.  Peripheral artery disease: Right first through fifth toes dry gangrene: Vascular surgery following with possible intervention on Monday with Dr. Chestine Spore. He wishes to wait to ensure clearance of bacteremia prior to procedure in case a stent is needed. The patient was quite upset upon hearing this. He believed that his toes were only "dry". The patient was taken to the vascular lab by Dr. Chestine Spore today where he underwent bilateral arteriogram with bilateral runoff and stent placement in Rt EIA. The patient returned to vascular lab on 04/22/2019 for  right SFA to TP trunk bypass for popliteal occlusion in addition to iliac lesion.   Tachycardia: Likely related to intraoperative volume loss. Will give a bolus of NS.   History of DVT: On Eliquis which is on hold due to possible procedure, held since 04/14/2019.  Possible rectal mass: Monitor. May require GI evaluation when more stable and bacteremia has cleared.  Type 2 diabetes with hyperglycemia: Hemoglobin A1c 8.3 on 04/14/2019. Continue insulin coverage. FSBS 107- 168 in the last 24 hours.  Essential hypertension: Continue to hold off antihypertensives for now due to soft blood pressure.  Hyperlipidemia: Continue Robaxin.  Diabetic polyneuropathy: Continue gabapentin  I have seen and examined this patient myself. I have spent 30 minutes in his evaluation and care.  DVT prophylaxis: SCDs. Code Status:Full Family Communication: None at bedside. Disposition Plan: Home when hemodynamically stable, vascular surgery and infectious disease have signed off.    Joyann Spidle, DO [facility] Direct contact: see [email or web address]  7PM-7AM contact night coverage as above 04/22/2019, 4:49 PM  LOS: 4 days

## 2019-04-22 NOTE — Progress Notes (Signed)
  Day of Surgery Note    Subjective:  No complaints   Vitals:   04/22/19 1203 04/22/19 1216  BP:  116/68  Pulse: 99 87  Resp: 19 13  Temp:    SpO2: 100% 100%    Incisions:   All are clean and dry  Extremities:  Brisk doppler signals right DP/PT Cardiac:  regular Lungs:  Non labored    Assessment/Plan:  This is a 44 y.o. male who is s/p  1.  Harvest of right great saphenous vein 2.  Right above-knee popliteal artery to tibioperoneal trunk bypass with reversed ipsilateral great saphenous vein 3.  Placement of 15 French round Blake drain in the right popliteal space  -pt with patent bypass with brisk doppler signals right DP/PT -mild bloody drainage in JP bulb -transfer to 4 east later this afternoon.   Doreatha Massed, PA-C 04/22/2019 12:34 PM [phone]

## 2019-04-22 NOTE — Anesthesia Procedure Notes (Signed)
Arterial Line Insertion Start/End1/10/2019 8:00 AM, 04/22/2019 8:04 AM Performed by: Kipp Brood, MD, anesthesiologist  Patient location: OR. Preanesthetic checklist: patient identified, IV checked, risks and benefits discussed, surgical consent, monitors and equipment checked and pre-op evaluation Patient sedated Left, radial was placed Catheter size: 20 G Hand hygiene performed  and maximum sterile barriers used   Attempts: 2 Procedure performed without using ultrasound guided technique. Following insertion, dressing applied and Biopatch. Post procedure assessment: normal  Patient tolerated the procedure well with no immediate complications.

## 2019-04-22 NOTE — Anesthesia Postprocedure Evaluation (Signed)
Anesthesia Post Note  Patient: Thomas [name]  Procedure(s) Performed: Right  FEMORAL-TIBIAL ARTERY BYPASS GRAFT with Harvest of Saphenous vein RIGHT SUPERFICIAL FEMORAL ARTERY TO TIBIOPERONEAL TRUNK BYPASS (Right Leg Upper)     Patient location during evaluation: PACU Anesthesia Type: General Level of consciousness: awake and alert Pain management: pain level controlled Vital Signs Assessment: post-procedure vital signs reviewed and stable Respiratory status: spontaneous breathing, nonlabored ventilation, respiratory function stable and patient connected to nasal cannula oxygen Cardiovascular status: blood pressure returned to baseline and stable Postop Assessment: no apparent nausea or vomiting Anesthetic complications: no    Last Vitals:  Vitals:   04/22/19 1459 04/22/19 1500  BP:    Pulse: 98   Resp: 16   Temp:  36.9 C  SpO2: 100%     Last Pain:  Vitals:   04/22/19 0412  TempSrc: Oral  PainSc:                  Thomas [name]

## 2019-04-22 NOTE — Anesthesia Preprocedure Evaluation (Addendum)
Anesthesia Evaluation  Patient identified by MRN, date of birth, ID band Patient awake    Reviewed: Allergy & Precautions, NPO status , Patient's Chart, lab work & pertinent test results  Airway Mallampati: I  TM Distance: >3 FB Neck ROM: Full    Dental  (+) Dental Advisory Given, Missing, Loose, Poor Dentition,    Pulmonary former smoker,    breath sounds clear to auscultation       Cardiovascular hypertension,  Rhythm:Regular Rate:Normal     Neuro/Psych    GI/Hepatic   Endo/Other  diabetes  Renal/GU      Musculoskeletal   Abdominal   Peds  Hematology   Anesthesia Other Findings   Reproductive/Obstetrics                            Anesthesia Physical Anesthesia Plan  ASA: III  Anesthesia Plan: General   Post-op Pain Management:    Induction: Intravenous  PONV Risk Score and Plan: Ondansetron  Airway Management Planned: Oral ETT  Additional Equipment: Arterial line  Intra-op Plan:   Post-operative Plan: Extubation in OR  Informed Consent: I have reviewed the patients History and Physical, chart, labs and discussed the procedure including the risks, benefits and alternatives for the proposed anesthesia with the patient or authorized representative who has indicated his/her understanding and acceptance.     Dental advisory given  Plan Discussed with: CRNA and Anesthesiologist  Anesthesia Plan Comments:         Anesthesia Quick Evaluation

## 2019-04-23 ENCOUNTER — Encounter (HOSPITAL_COMMUNITY): Payer: No Typology Code available for payment source

## 2019-04-23 LAB — CBC
HCT: 17.4 % — ABNORMAL LOW (ref 39.0–52.0)
Hemoglobin: 5.3 g/dL — CL (ref 13.0–17.0)
MCH: 24.2 pg — ABNORMAL LOW (ref 26.0–34.0)
MCHC: 30.5 g/dL (ref 30.0–36.0)
MCV: 79.5 fL — ABNORMAL LOW (ref 80.0–100.0)
Platelets: 421 10*3/uL — ABNORMAL HIGH (ref 150–400)
RBC: 2.19 MIL/uL — ABNORMAL LOW (ref 4.22–5.81)
RDW: 14.9 % (ref 11.5–15.5)
WBC: 6.2 10*3/uL (ref 4.0–10.5)
nRBC: 0 % (ref 0.0–0.2)

## 2019-04-23 LAB — CK: Total CK: 34 U/L — ABNORMAL LOW (ref 49–397)

## 2019-04-23 LAB — HEMOGLOBIN AND HEMATOCRIT, BLOOD
HCT: 22 % — ABNORMAL LOW (ref 39.0–52.0)
Hemoglobin: 7.3 g/dL — ABNORMAL LOW (ref 13.0–17.0)

## 2019-04-23 LAB — GLUCOSE, CAPILLARY
Glucose-Capillary: 138 mg/dL — ABNORMAL HIGH (ref 70–99)
Glucose-Capillary: 140 mg/dL — ABNORMAL HIGH (ref 70–99)
Glucose-Capillary: 173 mg/dL — ABNORMAL HIGH (ref 70–99)

## 2019-04-23 LAB — BASIC METABOLIC PANEL
Anion gap: 8 (ref 5–15)
BUN: 9 mg/dL (ref 6–20)
CO2: 26 mmol/L (ref 22–32)
Calcium: 8.1 mg/dL — ABNORMAL LOW (ref 8.9–10.3)
Chloride: 102 mmol/L (ref 98–111)
Creatinine, Ser: 0.86 mg/dL (ref 0.61–1.24)
GFR calc Af Amer: >60 mL/min (ref >60)
GFR calc non Af Amer: >60 mL/min (ref >60)
Glucose, Bld: 142 mg/dL — ABNORMAL HIGH (ref 70–99)
Potassium: 3.6 mmol/L (ref 3.5–5.1)
Sodium: 136 mmol/L (ref 135–145)

## 2019-04-23 LAB — PREPARE RBC (CROSSMATCH)

## 2019-04-23 MED ORDER — SODIUM CHLORIDE 0.9% IV SOLUTION: Freq: Once | INTRAVENOUS | Status: DC

## 2019-04-23 NOTE — Progress Notes (Signed)
PROGRESS NOTE  Thomas [name] SAY:[id number] DOB: 07-Nov-1975 DOA: 04/13/2019 PCP: [name], PA-C  Brief History   Thomas Mohammedis a 44 y.o.malewith medical history significant ofDM2 poorly controlled.  Recent hospitalization at [facility] regional for disseminated MRSA bacteremia, osteomyelitis and discitis.  Completed 6 weeks course of IV antibiotics on April 04, 2019.  Source of bacteremia thought to be from skin source (per previous facility report, area on toe that appeared cellulitic, area in R back that was erythematous and an abrasion of the lateral R aspect of his shin, abscess formation in the R calf and pyomyositis of the L vastus intermedius).  Developed necrosis of his R 1-5 digits since his discharge.    Presented here with worsening back pain, generalized weakness, fever and recent fall.  CT chest, abdomen and pelvis notable for findings of advanced osteomyelitis involving T9 as well as discitis.  Positive blood cultures for MRSA.  Seen by infectious disease, recommendations for IV Vancomycin.  Planned TEE on Monday.  Vascular surgery following for right 1st-5th toe dry gangrene. However, he wants to wait until current infection in controlled before proceeding with aortogram etc to address PAD and dry gangrene.  Pt underwent TEE on 04/19/2019 which did not demonstrate vegetations or other evidence of persistent endocarditis.   He was also taken to the vascular lab by Dr. Chestine Spore for Aortogram, bilateral lower extremity arteriogram with runoff, right external iliac artery angioplasty with stent placement under moderate conscious sedation. This was performed critical ischemia of the right lower extremity with gangrene of digits 1-5 of the right foot. Dr. Chestine Spore performed a right SFA to TP trunk bypass for popliteal occlusion in addition to iliac lesion on 04/22/2019. The patient apparently developed a thigh hematoma last night. He is receiving transfusion of 2 units PRBC's as his  hemoglobin of 5.3 this morning. ACE wrap has been placed on thigh.  Consultants  . Vascular surgery . Interventional radiology . Infectious disease . Wound Care  Procedures  . Aspiration of T9 to guide further therapy . Angiogram of bilateral lower extremities with stent placement in Rt EIA 04/19/2019.  Antibiotics   Anti-infectives (From admission, onward)   Start     Dose/Rate Route Frequency Ordered Stop   04/22/19 2000  DAPTOmycin (CUBICIN) 850 mg in sodium chloride 0.9 % IVPB     850 mg 234 mL/hr over 30 Minutes Intravenous Daily 04/22/19 1652     04/22/19 1700  ceftaroline (TEFLARO) 600 mg in sodium chloride 0.9 % 250 mL IVPB     600 mg 250 mL/hr over 60 Minutes Intravenous Every 8 hours 04/22/19 1652     04/22/19 1600  ceFAZolin (ANCEF) IVPB 2g/100 mL premix     2 g 200 mL/hr over 30 Minutes Intravenous Every 8 hours 04/22/19 1525 04/23/19 0217   04/22/19 0600  ceFAZolin (ANCEF) IVPB 2g/100 mL premix    Note to Pharmacy: Send with pt to OR   2 g 200 mL/hr over 30 Minutes Intravenous To Short Stay 04/21/19 0743 04/22/19 0813   04/18/19 1800  vancomycin (VANCOCIN) IVPB 1000 mg/200 mL premix  Status:  Discontinued     1,000 mg 200 mL/hr over 60 Minutes Intravenous Every 12 hours 04/18/19 0718 04/22/19 1652   04/14/19 1600  vancomycin (VANCOREADY) IVPB 1500 mg/300 mL  Status:  Discontinued     1,500 mg 150 mL/hr over 120 Minutes Intravenous Every 12 hours 04/14/19 1240 04/18/19 0718   04/13/19 2200  ceFEPIme (MAXIPIME) 2 g in sodium chloride  0.9 % 100 mL IVPB  Status:  Discontinued     2 g 200 mL/hr over 30 Minutes Intravenous Every 8 hours 04/13/19 1428 04/14/19 1028   04/13/19 1430  vancomycin (VANCOCIN) IVPB 1000 mg/200 mL premix  Status:  Discontinued     1,000 mg 200 mL/hr over 60 Minutes Intravenous Every 12 hours 04/13/19 1428 04/14/19 1240   04/13/19 1415  ceFEPIme (MAXIPIME) 2 g in sodium chloride 0.9 % 100 mL IVPB     2 g 200 mL/hr over 30 Minutes Intravenous   Once 04/13/19 1411 04/13/19 1505   04/13/19 1415  metroNIDAZOLE (FLAGYL) IVPB 500 mg     500 mg 100 mL/hr over 60 Minutes Intravenous  Once 04/13/19 1411 04/13/19 1600   04/13/19 1415  vancomycin (VANCOCIN) IVPB 1000 mg/200 mL premix  Status:  Discontinued     1,000 mg 200 mL/hr over 60 Minutes Intravenous  Once 04/13/19 1411 04/13/19 1428      Subjective  The patient is resting comfortably. No new complaints.  Objective   Vitals:  Vitals:   04/23/19 1102 04/23/19 1312  BP: 121/72   Pulse: 95   Resp: 15 (!) 22  Temp: 98.4 F (36.9 C)   SpO2: 100% 100%   Exam:  Constitutional:  . The patient is awake, alert, and oriented x 3. No acute distress. Respiratory:  . No increased work of breathing. . No wheezes, rales, or rhonchi . No tactile fremitus Cardiovascular:  . Regular rate and rhythm . No murmurs, ectopy, or gallups. . No lateral PMI. No thrills. Abdomen:  . Abdomen is soft, non-tender, non-distended . No hernias, masses, or organomegaly . Normoactive bowel sounds.  Musculoskeletal:  . No cyanosis or clubbing . 2-3+ pitting edema of lower extremities bilaterally . All 5 digits on right foot are blackened with dry gangrene. Skin:  . No rashes, lesions, ulcers . palpation of skin: no induration or nodules . Blackening of distal aspect of 5 digits on right foot. Neurologic:  . CN 2-12 intact . Sensation all 4 extremities intact Psychiatric:  . Mental status o Mood, affect appropriate o Orientation to person, place, time  . judgment and insight appear intact  I have personally reviewed the following:   Today's Data  . Vitals, CBC, BMP  Micro Data  . No growth of surveillance cultures.  Scheduled Meds: . sodium chloride   Intravenous Once  . atorvastatin  10 mg Oral q1800  . Chlorhexidine Gluconate Cloth  6 each Topical Daily  . clopidogrel  75 mg Oral Q breakfast  . docusate sodium  100 mg Oral Daily  . HYDROmorphone   Intravenous Q4H  . insulin  aspart  0-9 Units Subcutaneous TID WC  . insulin glargine  15 Units Subcutaneous QHS  . pantoprazole  40 mg Oral Daily  . pneumococcal 23 valent vaccine  0.5 mL Intramuscular Tomorrow-1000  . saccharomyces boulardii  250 mg Oral BID  . senna-docusate  2 tablet Oral BID  . sodium chloride flush  10-40 mL Intracatheter Q12H  . sodium chloride flush  3 mL Intravenous Once  . sodium chloride flush  3 mL Intravenous Q12H   Continuous Infusions: . sodium chloride 250 mL (04/13/19 2351)  . sodium chloride Stopped (04/13/19 2230)  . sodium chloride    . sodium chloride 100 mL/hr at 04/23/19 0951  . ceFTAROline (TEFLARO) IV 600 mg (04/23/19 0606)  . DAPTOmycin (CUBICIN)  IV 850 mg (04/22/19 2220)  . lactated ringers 100 mL/hr at 04/21/19  0400  . 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 Infectious disease has been consulted, recommendation for IV vancomycin for now. Repeated blood cultures on 04/14/2019 - to date. Continue to follow cultures. MRI independently reviewed showed progressive T9 osteomyelitis with increased cultures evaluation and vertebral body collapse.  New discitis at T8-9 and T9-T10 with T8 and T10 osteomyelitis.  Moderate to severe spinal stenosis at T8-T9. Persistent extensive anterior paravertebral soft tissue inflammation/phlegmon. Decreased posterior paraspinal inflammation without residual or new abscess. Cardiology has been consulted for TEE to assess for endocarditis. This was performed on 04/18/2018 and  demonstrated no vegetations or evidence of persistent endocarditis. Pain management in place. Continue bowel regimen.  MRSA bacteremia: Management as stated above. Currently afebrile with no leukocytosis. Continue to monitor WBC and fever curve.  T9 abscess status post aspiration by interventional radiology on 04/15/2019: Deep tissue culture growing staph aureus, continue to monitor cultures. Continue pain management and IV antibiotics.  Recurrent thoracic vertebral osteomyelitis: Previously treated with 6 weeks of IV antibiotics with IV daptomycin on 04/04/2019. Significantly elevated CRP 19.4 and sed rate >140. Management as per above.  Peripheral artery disease: Right first through fifth toes dry gangrene: Vascular surgery following with possible intervention on Monday with Dr. Carlis Abbott. He wishes to wait to ensure clearance of bacteremia prior to procedure in case a stent is needed. The patient was quite upset upon hearing this. He believed that his toes were only "dry". The patient was taken to the vascular lab by Dr. Carlis Abbott today where he underwent bilateral arteriogram with bilateral runoff and stent placement in Rt EIA. The patient returned to vascular lab on 04/22/2019 for  right SFA to TP trunk bypass for popliteal occlusion in addition to iliac lesion.   Tachycardia: Likely related to intraoperative volume loss. Will give a bolus of NS.   History of DVT: On Eliquis which is on hold due to possible procedure, held since 04/14/2019.  Possible rectal mass: Monitor. May require GI evaluation when more stable and bacteremia has cleared.  Type 2 diabetes with hyperglycemia: Hemoglobin A1c 8.3 on 04/14/2019. Continue insulin coverage. FSBS 138- 168 in the last 24 hours.  Essential hypertension: Continue to hold off antihypertensives for now due to soft blood pressure.  Hyperlipidemia: Continue Robaxin.  Diabetic polyneuropathy: Continue gabapentin  I have seen and examined this patient  myself. I have spent 32 minutes in his evaluation and care.  DVT prophylaxis: SCDs. Code Status:Full Family Communication: None at bedside. Disposition Plan: Home when hemodynamically stable, vascular surgery and infectious disease have signed off.    Maddi Collar, DO [facility] Direct contact: see [email or web address]  7PM-7AM contact night coverage as above 04/23/2019, 2:01 PM  LOS: 4 days

## 2019-04-23 NOTE — Progress Notes (Addendum)
Progress Note    04/23/2019 7:05 AM 1 Day Post-Op  Subjective:  Right pop-TP trunk bypass with RSVG. He is awake, alert and in NAD. He is complaining of "all-over" right lower extremity pain.  He is voiding spontaneously and has no N,V.  RN notes reviewed.  Concern for thigh hematoma and ace wraps applied. (Pt was on Plavix prior to surgery due to recent stent.)   Vitals:   04/23/19 0430 04/23/19 0630  BP: (!) 107/56 123/72  Pulse: 99 (!) 103  Resp: 15 17  Temp: 98 F (36.7 C)   SpO2: 100% 100%    Physical Exam: General: pale, in NAD Cardiac:  Regular rhythm, slightly elevated rate (116) Lungs:  CTA Bil Incisions:  All incisions well approximated. No ecchymosis. Inked margins noted without increase in swelling Extremities:  AROM and sensation in right foot. Generalized edema of RLE, but calf in not tense. Brisk Doppler signal of peroneal and PT pulse. AT and DP present. Positive Doppler signal in graft. Dry ischemic changes of toe tips noted. No signs of wet gangrene.    CBC    Component Value Date/Time   WBC 6.2 04/23/2019 0510   RBC 2.19 (L) 04/23/2019 0510   HGB 5.3 (LL) 04/23/2019 0510   HCT 17.4 (L) 04/23/2019 0510   PLT 421 (H) 04/23/2019 0510   MCV 79.5 (L) 04/23/2019 0510   MCH 24.2 (L) 04/23/2019 0510   MCHC 30.5 04/23/2019 0510   RDW 14.9 04/23/2019 0510   LYMPHSABS 1.5 04/21/2019 0533   MONOABS 0.6 04/21/2019 0533   EOSABS 0.1 04/21/2019 0533   BASOSABS 0.0 04/21/2019 0533    BMET    Component Value Date/Time   NA 136 04/23/2019 0510   K 3.6 04/23/2019 0510   CL 102 04/23/2019 0510   CO2 26 04/23/2019 0510   GLUCOSE 142 (H) 04/23/2019 0510   BUN 9 04/23/2019 0510   CREATININE 0.86 04/23/2019 0510   CREATININE 0.98 11/14/2016 1352   CALCIUM 8.1 (L) 04/23/2019 0510   GFRNONAA >60 04/23/2019 0510   GFRNONAA >89 11/14/2016 1352   GFRAA >60 04/23/2019 0510   GFRAA >89 11/14/2016 1352   Blake drain output: 25cc  Intake/Output Summary (Last 24  hours) at 04/23/2019 0705 Last data filed at 04/23/2019 0606 Gross per 24 hour  Intake 6206.69 ml  Output 2450 ml  Net 3756.69 ml    HOSPITAL MEDICATIONS Scheduled Meds: . sodium chloride   Intravenous Once  . atorvastatin  10 mg Oral q1800  . Chlorhexidine Gluconate Cloth  6 each Topical Daily  . clopidogrel  75 mg Oral Q breakfast  . docusate sodium  100 mg Oral Daily  . HYDROmorphone   Intravenous Q4H  . insulin aspart  0-9 Units Subcutaneous TID WC  . insulin glargine  15 Units Subcutaneous QHS  . pantoprazole  40 mg Oral Daily  . pneumococcal 23 valent vaccine  0.5 mL Intramuscular Tomorrow-1000  . saccharomyces boulardii  250 mg Oral BID  . senna-docusate  2 tablet Oral BID  . sodium chloride flush  10-40 mL Intracatheter Q12H  . sodium chloride flush  3 mL Intravenous Once  . sodium chloride flush  3 mL Intravenous Q12H   Continuous Infusions: . sodium chloride 250 mL (04/13/19 2351)  . sodium chloride Stopped (04/13/19 2230)  . sodium chloride    . sodium chloride 100 mL/hr at 04/22/19 2343  . ceFTAROline (TEFLARO) IV 600 mg (04/23/19 0606)  . DAPTOmycin (CUBICIN)  IV 850 mg (04/22/19  2220)  . lactated ringers 100 mL/hr at 04/21/19 0400  . magnesium sulfate bolus IVPB     PRN Meds:.sodium chloride, sodium chloride, sodium chloride, acetaminophen, acetaminophen, diphenhydrAMINE **OR** diphenhydrAMINE, guaiFENesin-dextromethorphan, hydrALAZINE, ibuprofen, labetalol, magnesium sulfate bolus IVPB, metoprolol tartrate, naloxone **AND** sodium chloride flush, ondansetron **OR** ondansetron (ZOFRAN) IV, oxyCODONE, phenol, potassium chloride, simethicone, sodium chloride flush, sodium chloride flush  Assessment:  44 y.o. male is s/p:  1.  Harvest of right great saphenous vein 2.  Right above-knee popliteal artery to tibioperoneal trunk bypass with reversed ipsilateral great saphenous vein 3.  Placement of 15 French round Blake drain in the right popliteal space  1 Day  Post-Op  Plan: -2 units PRBC ordered per hospitalists. Continue ace wrap, elevation. Plan to dc drain tomorrow. -DVT prophylaxis:  SCD   Risa Grill, PA-C [facility] [phone] 04/23/2019  7:05 AM   I have seen and evaluated the patient. I agree with the PA note as documented above.  Postop day 1 status post right above-knee popliteal artery to tibioperoneal trunk bypass for critical limb ischemia with tissue loss.  Has excellent posterior tibial signal today and has only two-vessel runoff via posterior tibial and peroneal arteries.  Hemoglobin 5.3 from acute blood loss anemia in the OR.  Preoperative hemoglobin was 8.  Agree with transfusing 2 units and rechecking CBC.  We will leave drain in the popliteal space for one more day.  He did have evidence of a hematoma at the above-knee popliteal exposure but this looks better today.  The drain extends to above knee popliteal space and only serosanguinous drainage.   Continue elevation of the right leg and gentle compression with Ace wraps.  Pain seems better controlled with PCA.  Marty Heck, MD [facility] [place] Office: [phone]

## 2019-04-23 NOTE — Progress Notes (Signed)
Plan of care reviewed, Pt's progressing. He's alert and oriented x 4.  Complained having severe pain scale 10/10 on his back while we were waiting for PCA pump and medication from main pharmacy. Oxycodone given, pharmacist consulted.  After Pt got Dilaudid via PCA pump, he stated his pain was relived, scale 0-5/10. He's able to sleep and rest better.   Right surgical wound has wrapped with elastic compression dressing since day shift, Marcelino Duster, RN reported, Dr. Chestine Spore already made aware about Pt's hematoma on right thigh after transferred from PACU. We're able to detect strong signals from Dorsalis Pedal, Posterior Tibial pulse via doppler bilaterally. JP drain milked frequently, got minimal serosanguinous drainage in tubing.  Pt stated he has right foot numbness and pain. Assessed both feet warm touched equally.  His vital signs stable, remained afebrile.   Temp 98.3- 98.6 degree F, BP 93/70-121/64 from BP cuff on left arm,  A-line BP 114/56 - 142/ 65 mmHg with accurate waveform after leveled and calibrated. HR 99-114, Sinus tachycardia on monitor, RR 12-15,  SPO2 100% on room air,  No immediate distress noted tonight. We will continue to monitor.  Filiberto Pinks, RN

## 2019-04-23 NOTE — Progress Notes (Signed)
Physical Therapy Treatment Patient Details Name: Thomas [name] MRN: [id number] DOB: 1976/03/08 Today's Date: 04/23/2019    History of Present Illness Patient is a 44 y/o male who presents with back pain, dizziness and fall at home. Recent long hospital stay at [facility] regional for disseminated MRSA bacteremia, osteomyelitis and discitis. Chest CT, abdomen and pelvis notable for findings of advanced osteomyelitis involving T9 as well as discitis T8-9, T9-10. s/p successful fluoro guided T9 vertebral body aspiration and biopsy 12/31. Positive blood cultures for MRSA. Also admitted for necrosis of right toes 1-5. s/p Right pop-TP trunk bypass with RSVG 04/22/19.    PT Comments    Patient s/p bypass graft RLE and mobility is limited due to pain. Translator present during session. Requires Min-mod A for bed mobility, especially assist with RLE. Noted to have swelling and warmth to RLE. Able to sit EOB ~8 minutes, reports some dizziness. Pt declined standing or further mobility due to pain. PCA encouraged and used. May need a different pain protocol so pt can tolerate activity. Instructed pt in there ex and AROM to perform while in bed. Right knee extension limited. Will continue to follow and progress as tolerated.   Follow Up Recommendations  Home health PT;Supervision - Intermittent     Equipment Recommendations  None recommended by PT    Recommendations for Other Services       Precautions / Restrictions Precautions Precautions: Fall Precaution Comments: jp drain, right thigh hematoma, PCA pump Restrictions Weight Bearing Restrictions: No    Mobility  Bed Mobility Overal bed mobility: Needs Assistance Bed Mobility: Supine to Sit   Sidelying to sit: Mod assist;HOB elevated Supine to sit: Min assist;HOB elevated     General bed mobility comments: Increased time, assist with RLE and bottom to get to EOB with cues for technique;Assist to bring RLE into bed and for repositioning  returning to supine.  Transfers                 General transfer comment: Pt declined due to pain.  Ambulation/Gait             General Gait Details: Declined due to pain   Stairs             Wheelchair Mobility    Modified Rankin (Stroke Patients Only)       Balance Overall balance assessment: Needs assistance;History of Falls Sitting-balance support: Feet supported;Bilateral upper extremity supported Sitting balance-Leahy Scale: Fair Sitting balance - Comments: Requires UE support as position of comfort due to pain Postural control: Posterior lean     Standing balance comment: Declined attempt to stand.                            Cognition Arousal/Alertness: Awake/alert Behavior During Therapy: WFL for tasks assessed/performed Overall Cognitive Status: Within Functional Limits for tasks assessed                                        Exercises      General Comments General comments (skin integrity, edema, etc.): Translator present. Swelling and warmth present RLE. Incisions intact.      Pertinent Vitals/Pain Pain Assessment: 0-10 Pain Score: 8  Pain Location: RLE Pain Descriptors / Indicators: Operative site guarding;Grimacing;Guarding;Sore;Constant;Tender Pain Intervention(s): Repositioned;Monitored during session;PCA encouraged;Limited activity within patient's tolerance    Home Living  Prior Function            PT Goals (current goals can now be found in the care plan section) Progress towards PT goals: Not progressing toward goals - comment(pain limiting s/p surgery)    Frequency    Min 3X/week      PT Plan Current plan remains appropriate    Co-evaluation              AM-PAC PT "6 Clicks" Mobility   Outcome Measure  Help needed turning from your back to your side while in a flat bed without using bedrails?: A Little Help needed moving from lying on your  back to sitting on the side of a flat bed without using bedrails?: A Lot Help needed moving to and from a bed to a chair (including a wheelchair)?: A Lot Help needed standing up from a chair using your arms (e.g., wheelchair or bedside chair)?: A Little Help needed to walk in hospital room?: A Little Help needed climbing 3-5 steps with a railing? : A Lot 6 Click Score: 15    End of Session Equipment Utilized During Treatment: Oxygen Activity Tolerance: Patient limited by pain Patient left: in bed;with call bell/phone within reach;with bed alarm set;Other (comment)(translator present) Nurse Communication: Mobility status PT Visit Diagnosis: Pain;Muscle weakness (generalized) (M62.81);Unsteadiness on feet (R26.81) Pain - Right/Left: Right Pain - part of body: Leg     Time: 2683-4196 PT Time Calculation (min) (ACUTE ONLY): 17 min  Charges:  $Therapeutic Activity: 8-22 mins                     Thomas [name], PT, DPT Acute Rehabilitation Services Pager [phone] Office [phone]       Thomas [name] 04/23/2019, 3:03 PM

## 2019-04-23 NOTE — Progress Notes (Signed)
OT Cancellation Note  Patient Details Name: Thomas [name] MRN: [id number] DOB: 01/08/1976   Cancelled Treatment:    Reason Eval/Treat Not Completed: Medical issues which prohibited therapy(pt with hgb of 5.3)  Evern Bio 04/23/2019, 1:58 PM  Martie Round, OTR/L Acute Rehabilitation Services Pager: [phone] Office: [phone]

## 2019-04-23 NOTE — Progress Notes (Signed)
Date and time results received critical result: 04/23/19 at 06:33 am  Test: CBC  Critical Value:  Hb 5.3  Name of Provider Notified: On-call provider, Bodenhiemer, NP  Orders Received: preparing 2 units of PRBC.  Vital signs remain stable. No active bleeding from JP drain noted. Right leg hematoma marked at the same compared with yesterday and softer touched.  Filiberto Pinks, RN

## 2019-04-23 NOTE — Progress Notes (Signed)
Patient ID: Thomas [name], male   DOB: 02-12-1976, 44 y.o.   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  I remain concerned that he has a very large burden of infected bone in his thoracic spine.  PLAN: 1. Recommend neurosurgical consultation 2. Continue ceftaroline and daptomycin 3. Please call me for any infectious disease questions this weekend  Principal Problem:   MRSA bacteremia Active Problems:   Osteomyelitis of spine (HCC)   Gangrene of toe of right foot (HCC)   Discitis of thoracolumbar region   Endocarditis of mitral valve   Diabetes mellitus type II, uncontrolled (HCC)   HTN (hypertension)   Rectal mass   DVT of popliteal vein (HCC)   Scheduled Meds: . sodium chloride   Intravenous Once  . atorvastatin  10 mg Oral q1800  . Chlorhexidine Gluconate Cloth  6 each Topical Daily  . clopidogrel  75 mg Oral Q breakfast  . docusate sodium  100 mg Oral Daily  . HYDROmorphone   Intravenous Q4H  . insulin aspart  0-9 Units Subcutaneous TID WC  . insulin glargine  15 Units Subcutaneous QHS  . pantoprazole  40 mg Oral Daily  . pneumococcal 23 valent vaccine  0.5 mL Intramuscular Tomorrow-1000  . saccharomyces boulardii  250 mg Oral BID  . senna-docusate  2 tablet Oral BID  . sodium chloride flush  10-40 mL Intracatheter Q12H  . sodium chloride flush  3 mL Intravenous Once  . sodium chloride flush  3 mL Intravenous Q12H   Continuous Infusions: . sodium chloride 250 mL (04/13/19 2351)  . sodium chloride Stopped (04/13/19 2230)  . sodium chloride    . sodium chloride 100 mL/hr at 04/23/19 0951  . ceFTAROline (TEFLARO) IV 600 mg (04/23/19 0606)  . DAPTOmycin (CUBICIN)  IV 850 mg (04/22/19 2220)  . lactated ringers 100  mL/hr at 04/21/19 0400  . magnesium sulfate bolus IVPB     PRN Meds:.sodium chloride, sodium chloride, sodium chloride, acetaminophen, acetaminophen, diphenhydrAMINE **OR** diphenhydrAMINE, guaiFENesin-dextromethorphan, hydrALAZINE, ibuprofen, labetalol, magnesium sulfate bolus IVPB, metoprolol tartrate, naloxone **AND** sodium chloride flush, ondansetron **OR** ondansetron (ZOFRAN) IV, oxyCODONE, phenol, potassium chloride, simethicone, sodium chloride flush, sodium chloride flush   SUBJECTIVE: He is feeling better today.  He says that his back pain has improved slightly since admission.  Review of Systems: Review of Systems  Constitutional: Positive for malaise/fatigue. Negative for fever.  Respiratory: Negative for cough and shortness of breath.   Cardiovascular: Negative for chest pain.  Musculoskeletal: Positive for back pain and joint pain.    No Known Allergies  OBJECTIVE: Vitals:   04/23/19 0818 04/23/19 0902 04/23/19 1102 04/23/19 1312  BP: 122/69  121/72   Pulse: 98  95   Resp: 14 16 15  (!) 22  Temp: 98.3 F (36.8 C)  98.4 F (36.9 C)   TempSrc: Oral  Oral   SpO2: 99% 100% 100% 100%  Weight:      Height:       Body mass index is 27.79 kg/m.  Physical Exam Constitutional:      Comments: He is resting quietly in bed.  He is smiling and appears much more comfortable today.  An arabic interpreter is at the  bedside.  Cardiovascular:     Rate and Rhythm: Normal rate and regular rhythm.     Heart sounds: No murmur.  Pulmonary:     Effort: Pulmonary effort is normal.     Breath sounds: Normal breath sounds.  Abdominal:     Palpations: Abdomen is soft.     Tenderness: There is no abdominal tenderness.  Musculoskeletal:        General: No swelling or tenderness.  Skin:    Findings: No rash.     Lab Results Lab Results  Component Value Date   WBC 6.2 04/23/2019   HGB 5.3 (LL) 04/23/2019   HCT 17.4 (L) 04/23/2019   MCV 79.5 (L) 04/23/2019   PLT 421 (H)  04/23/2019    Lab Results  Component Value Date   CREATININE 0.86 04/23/2019   BUN 9 04/23/2019   NA 136 04/23/2019   K 3.6 04/23/2019   CL 102 04/23/2019   CO2 26 04/23/2019    Lab Results  Component Value Date   ALT 25 04/13/2019   AST 14 (L) 04/13/2019   ALKPHOS 84 04/13/2019   BILITOT 0.4 04/13/2019     Microbiology: Recent Results (from the past 240 hour(s))  SARS CORONAVIRUS 2 (TAT 6-24 HRS) Nasopharyngeal Nasopharyngeal Swab     Status: None   Collection Time: 04/13/19  3:31 PM   Specimen: Nasopharyngeal Swab  Result Value Ref Range Status   SARS Coronavirus 2 NEGATIVE NEGATIVE Final    Comment: (NOTE) SARS-CoV-2 target nucleic acids are NOT DETECTED. The SARS-CoV-2 RNA is generally detectable in upper and lower respiratory specimens during the acute phase of infection. Negative results do not preclude SARS-CoV-2 infection, do not rule out co-infections with other pathogens, and should not be used as the sole basis for treatment or other patient management decisions. Negative results must be combined with clinical observations, patient history, and epidemiological information. The expected result is Negative. Fact Sheet for Patients: HairSlick.nohttps://www.fda.gov/media/138098/download Fact Sheet for Healthcare Providers: quierodirigir.comhttps://www.fda.gov/media/138095/download This test is not yet approved or cleared by the [place] FDA and  has been authorized for detection and/or diagnosis of SARS-CoV-2 by FDA under an Emergency Use Authorization (EUA). This EUA will remain  in effect (meaning this test can be used) for the duration of the COVID-19 declaration under Section 56 4(b)(1) of the Act, 21 U.S.C. section 360bbb-3(b)(1), unless the authorization is terminated or revoked sooner. Performed at [facility], [address] [place], [place] [address]   Urine culture     Status: None   Collection Time: 04/13/19  7:45 PM   Specimen: Urine, Random  Result Value Ref  Range Status   Specimen Description   Final    URINE, RANDOM Performed at [facility] [place], [address] [place], [place] [address]    Special Requests   Final    NONE Performed at [facility] [place] [facility], [address] [place], [place] [address]    Culture   Final    NO GROWTH Performed at [facility], [address] [place], [place] [address]    Report Status 04/14/2019 FINAL  Final  Surgical pcr screen     Status: Abnormal   Collection Time: 04/14/19  4:42 AM   Specimen: Nasal Mucosa; Nasal Swab  Result Value Ref Range Status   MRSA, PCR POSITIVE (A) NEGATIVE Final    Comment: RESULT CALLED TO, READ BACK BY AND VERIFIED WITH: C JOHNSON ON 123020 AT 1143 BY  NFIELDS    Staphylococcus aureus POSITIVE (A) NEGATIVE Final    Comment: (NOTE) The Xpert SA Assay (FDA approved for NASAL specimens in patients 80 years of age and older), is one component of a comprehensive surveillance program. It is not intended to diagnose infection nor to guide or monitor treatment. Performed at [facility], [place] [address], [place], [place] [address]   Culture, blood (routine x 2)     Status: Abnormal   Collection Time: 04/14/19 12:31 PM   Specimen: BLOOD RIGHT HAND  Result Value Ref Range Status   Specimen Description BLOOD RIGHT HAND  Final   Special Requests   Final    BOTTLES DRAWN AEROBIC AND ANAEROBIC Blood Culture adequate volume   Culture  Setup Time   Final    GRAM POSITIVE COCCI ANAEROBIC BOTTLE ONLY CRITICAL RESULT CALLED TO, READ BACK BY AND VERIFIED WITH: Cameron, PHARMD AT 1324 ON 04/18/19 BY C. JESSUP, MT. Performed at [facility], [place] [address] [place], [place] [address]    Culture METHICILLIN RESISTANT STAPHYLOCOCCUS AUREUS (A)  Final   Report Status 04/20/2019 FINAL  Final   Organism ID, Bacteria METHICILLIN RESISTANT STAPHYLOCOCCUS AUREUS  Final      Susceptibility   Methicillin resistant staphylococcus aureus - MIC*     CIPROFLOXACIN >=8 RESISTANT Resistant     ERYTHROMYCIN >=8 RESISTANT Resistant     GENTAMICIN <=0.5 SENSITIVE Sensitive     OXACILLIN >=4 RESISTANT Resistant     TETRACYCLINE <=1 SENSITIVE Sensitive     VANCOMYCIN <=0.5 SENSITIVE Sensitive     TRIMETH/SULFA <=10 SENSITIVE Sensitive     CLINDAMYCIN >=8 RESISTANT Resistant     RIFAMPIN <=0.5 SENSITIVE Sensitive     Inducible Clindamycin NEGATIVE Sensitive     * METHICILLIN RESISTANT STAPHYLOCOCCUS AUREUS  Culture, blood (routine x 2)     Status: None   Collection Time: 04/14/19 12:46 PM   Specimen: BLOOD LEFT HAND  Result Value Ref Range Status   Specimen Description BLOOD LEFT HAND  Final   Special Requests   Final    BOTTLES DRAWN AEROBIC ONLY Blood Culture results may not be optimal due to an inadequate volume of blood received in culture bottles   Culture   Final    NO GROWTH 5 DAYS Performed at [facility], [place] [address] [place], [place] [address]    Report Status 04/19/2019 FINAL  Final  Aerobic/Anaerobic Culture (surgical/deep wound)     Status: None (Preliminary result)   Collection Time: 04/15/19  1:45 PM   Specimen: A: PATH Bone biopsy   B: PATH Other; Abscess  Result Value Ref Range Status   Specimen Description BONE BIOPSY T9  Final   Special Requests NONE  Final   Gram Stain   Final    RARE WBC PRESENT, PREDOMINANTLY MONONUCLEAR RARE GRAM POSITIVE COCCI    Culture   Final    RARE METHICILLIN RESISTANT STAPHYLOCOCCUS AUREUS NO ANAEROBES ISOLATED Sent to [place] for further susceptibility testing. Performed at [facility], [place] [address], [place], [place] [address]    Report Status PENDING  Incomplete   Organism ID, Bacteria METHICILLIN RESISTANT STAPHYLOCOCCUS AUREUS  Final      Susceptibility   Methicillin resistant staphylococcus aureus - MIC*    CIPROFLOXACIN >=8 RESISTANT Resistant     ERYTHROMYCIN >=8 RESISTANT Resistant     GENTAMICIN <=0.5 SENSITIVE Sensitive     OXACILLIN >=4 RESISTANT  Resistant  TETRACYCLINE <=1 SENSITIVE Sensitive     VANCOMYCIN 2 SENSITIVE Sensitive     TRIMETH/SULFA <=10 SENSITIVE Sensitive     CLINDAMYCIN >=8 RESISTANT Resistant     RIFAMPIN <=0.5 SENSITIVE Sensitive     Inducible Clindamycin NEGATIVE Sensitive     * RARE METHICILLIN RESISTANT STAPHYLOCOCCUS AUREUS  Aerobic/Anaerobic Culture (surgical/deep wound)     Status: None   Collection Time: 04/15/19  1:53 PM   Specimen: Abscess; Tissue  Result Value Ref Range Status   Specimen Description ABSCESS T9 DISC ASPIRATION 2  Final   Special Requests Normal  Final   Gram Stain NO WBC SEEN RARE GRAM POSITIVE COCCI IN PAIRS   Final   Culture   Final    RARE METHICILLIN RESISTANT STAPHYLOCOCCUS AUREUS NO ANAEROBES ISOLATED Performed at [facility], [address], [place], [place] [address]    Report Status 04/20/2019 FINAL  Final   Organism ID, Bacteria METHICILLIN RESISTANT STAPHYLOCOCCUS AUREUS  Final      Susceptibility   Methicillin resistant staphylococcus aureus - MIC*    CIPROFLOXACIN >=8 RESISTANT Resistant     ERYTHROMYCIN >=8 RESISTANT Resistant     GENTAMICIN <=0.5 SENSITIVE Sensitive     OXACILLIN >=4 RESISTANT Resistant     TETRACYCLINE <=1 SENSITIVE Sensitive     VANCOMYCIN <=0.5 SENSITIVE Sensitive     TRIMETH/SULFA <=10 SENSITIVE Sensitive     CLINDAMYCIN >=8 RESISTANT Resistant     RIFAMPIN <=0.5 SENSITIVE Sensitive     Inducible Clindamycin NEGATIVE Sensitive     * RARE METHICILLIN RESISTANT STAPHYLOCOCCUS AUREUS  Aerobic/Anaerobic Culture (surgical/deep wound)     Status: None   Collection Time: 04/15/19  1:53 PM   Specimen: Abscess  Result Value Ref Range Status   Specimen Description ABSCESS T9 DISC ASPIRATION  Final   Special Requests Normal  Final   Gram Stain NO WBC SEEN NO ORGANISMS SEEN   Final   Culture   Final    RARE METHICILLIN RESISTANT STAPHYLOCOCCUS AUREUS NO ANAEROBES ISOLATED Performed at [facility], [address],  [place], [place] [address]    Report Status 04/20/2019 FINAL  Final   Organism ID, Bacteria METHICILLIN RESISTANT STAPHYLOCOCCUS AUREUS  Final      Susceptibility   Methicillin resistant staphylococcus aureus - MIC*    CIPROFLOXACIN >=8 RESISTANT Resistant     ERYTHROMYCIN >=8 RESISTANT Resistant     GENTAMICIN <=0.5 SENSITIVE Sensitive     OXACILLIN >=4 RESISTANT Resistant     TETRACYCLINE <=1 SENSITIVE Sensitive     VANCOMYCIN 1 SENSITIVE Sensitive     TRIMETH/SULFA <=10 SENSITIVE Sensitive     CLINDAMYCIN >=8 RESISTANT Resistant     RIFAMPIN <=0.5 SENSITIVE Sensitive     Inducible Clindamycin NEGATIVE Sensitive     * RARE METHICILLIN RESISTANT STAPHYLOCOCCUS AUREUS  Culture, blood (routine x 2)     Status: None (Preliminary result)   Collection Time: 04/19/19  3:06 PM   Specimen: BLOOD  Result Value Ref Range Status   Specimen Description BLOOD ASSIST CONTROL  Final   Special Requests   Final    BOTTLES DRAWN AEROBIC ONLY Blood Culture adequate volume   Culture   Final    NO GROWTH 4 DAYS Performed at [facility], [address] [place], [place] [address]    Report Status PENDING  Incomplete  Culture, blood (routine x 2)     Status: None (Preliminary result)   Collection Time: 04/19/19  3:06 PM  Specimen: BLOOD LEFT HAND  Result Value Ref Range Status   Specimen Description BLOOD LEFT HAND  Final   Special Requests   Final    BOTTLES DRAWN AEROBIC ONLY Blood Culture adequate volume   Culture   Final    NO GROWTH 4 DAYS Performed at [facility], [address] [place], [place] [address]    Report Status PENDING  Incomplete  Surgical pcr screen     Status: None   Collection Time: 04/22/19  4:06 AM   Specimen: Nasal Mucosa; Nasal Swab  Result Value Ref Range Status   MRSA, PCR NEGATIVE NEGATIVE Final   Staphylococcus aureus NEGATIVE NEGATIVE Final    Comment: (NOTE) The Xpert SA Assay (FDA approved for NASAL specimens in patients 9 years of age and older),  is one component of a comprehensive surveillance program. It is not intended to diagnose infection nor to guide or monitor treatment. Performed at [facility], [address], [place], [place] [address]     Cliffton Asters, MD [facility] for Infectious Disease [facility] [phone] pager   [phone] cell 04/23/2019, 1:37 PM

## 2019-04-24 ENCOUNTER — Inpatient Hospital Stay (HOSPITAL_COMMUNITY): Payer: Medicaid Other

## 2019-04-24 LAB — CBC WITH DIFFERENTIAL/PLATELET
Abs Immature Granulocytes: 0.05 10*3/uL (ref 0.00–0.07)
Basophils Absolute: 0 10*3/uL (ref 0.0–0.1)
Basophils Relative: 0 %
Eosinophils Absolute: 0.1 10*3/uL (ref 0.0–0.5)
Eosinophils Relative: 1 %
HCT: 23.6 % — ABNORMAL LOW (ref 39.0–52.0)
Hemoglobin: 7.4 g/dL — ABNORMAL LOW (ref 13.0–17.0)
Immature Granulocytes: 1 %
Lymphocytes Relative: 21 %
Lymphs Abs: 1.7 10*3/uL (ref 0.7–4.0)
MCH: 25.4 pg — ABNORMAL LOW (ref 26.0–34.0)
MCHC: 31.4 g/dL (ref 30.0–36.0)
MCV: 81.1 fL (ref 80.0–100.0)
Monocytes Absolute: 0.8 10*3/uL (ref 0.1–1.0)
Monocytes Relative: 9 %
Neutro Abs: 5.5 10*3/uL (ref 1.7–7.7)
Neutrophils Relative %: 68 %
Platelets: 426 10*3/uL — ABNORMAL HIGH (ref 150–400)
RBC: 2.91 MIL/uL — ABNORMAL LOW (ref 4.22–5.81)
RDW: 15.6 % — ABNORMAL HIGH (ref 11.5–15.5)
WBC: 8.1 10*3/uL (ref 4.0–10.5)
nRBC: 0 % (ref 0.0–0.2)

## 2019-04-24 LAB — BPAM RBC
Blood Product Expiration Date: 202102042359
Blood Product Expiration Date: 202102042359
ISSUE DATE / TIME: 202101080739
ISSUE DATE / TIME: 202101081432
Unit Type and Rh: 5100
Unit Type and Rh: 5100

## 2019-04-24 LAB — BASIC METABOLIC PANEL
Anion gap: 7 (ref 5–15)
BUN: 7 mg/dL (ref 6–20)
CO2: 25 mmol/L (ref 22–32)
Calcium: 8.2 mg/dL — ABNORMAL LOW (ref 8.9–10.3)
Chloride: 103 mmol/L (ref 98–111)
Creatinine, Ser: 0.88 mg/dL (ref 0.61–1.24)
GFR calc Af Amer: >60 mL/min (ref >60)
GFR calc non Af Amer: >60 mL/min (ref >60)
Glucose, Bld: 155 mg/dL — ABNORMAL HIGH (ref 70–99)
Potassium: 3.7 mmol/L (ref 3.5–5.1)
Sodium: 135 mmol/L (ref 135–145)

## 2019-04-24 LAB — CULTURE, BLOOD (ROUTINE X 2)
Culture: NO GROWTH
Culture: NO GROWTH
Special Requests: ADEQUATE
Special Requests: ADEQUATE

## 2019-04-24 LAB — TYPE AND SCREEN
ABO/RH(D): O POS
Antibody Screen: NEGATIVE
Unit division: 0
Unit division: 0

## 2019-04-24 LAB — GLUCOSE, CAPILLARY
Glucose-Capillary: 119 mg/dL — ABNORMAL HIGH (ref 70–99)
Glucose-Capillary: 170 mg/dL — ABNORMAL HIGH (ref 70–99)
Glucose-Capillary: 155 mg/dL — ABNORMAL HIGH (ref 70–99)
Glucose-Capillary: 127 mg/dL — ABNORMAL HIGH (ref 70–99)
Glucose-Capillary: 131 mg/dL — ABNORMAL HIGH (ref 70–99)
Glucose-Capillary: 124 mg/dL — ABNORMAL HIGH (ref 70–99)
Glucose-Capillary: 89 mg/dL (ref 70–99)

## 2019-04-24 LAB — CK: Total CK: 40 U/L — ABNORMAL LOW (ref 49–397)

## 2019-04-24 IMAGING — MR MR THORACIC SPINE WO/W CM
7 of 10 series · 22 of 48 positions shown · IV contrast (gadavist)
Comparison: [DATE]

CLINICAL DATA: Discitis/osteomyelitis, follow-up

EXAM:
MRI THORACIC WITHOUT AND WITH CONTRAST
TECHNIQUE: Multiplanar and multiecho pulse sequences of the thoracic spine were
obtained without and with intravenous contrast.
CONTRAST:  8mL GADAVIST GADOBUTROL 1 MMOL/ML IV SOLN

[Series 14: T1 · sagittal · 6.0mm · 1.23mm/px · 1 of 9 slices shown (1 of 3)]
[im 1/9]
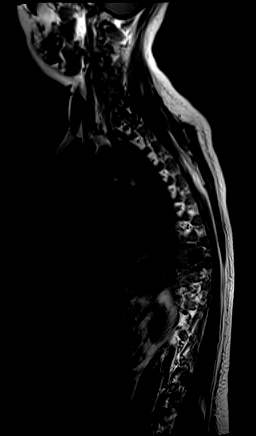

[Series 15: T2 · sagittal · 3.0mm · 0.82mm/px · 3 of 21 slices shown (1 of 2)]
[im 1/21]
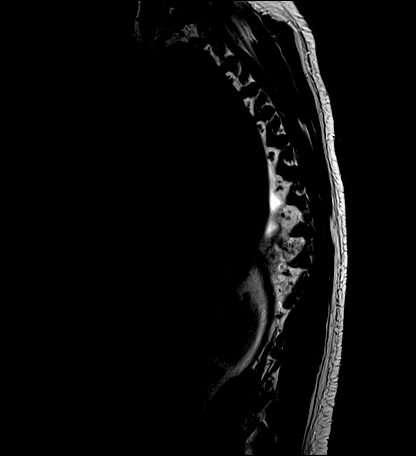
[im 11/21]
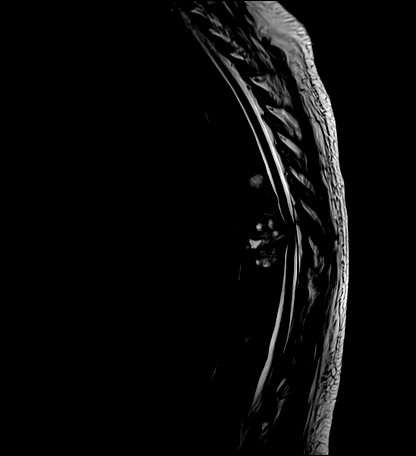
[im 21/21]
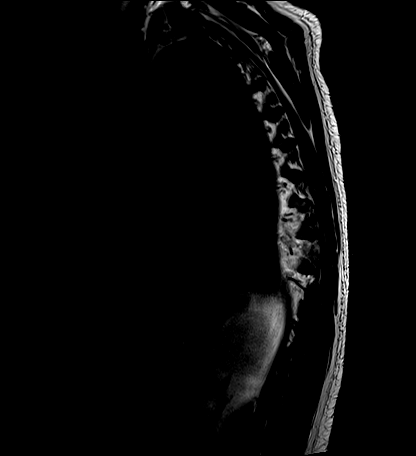

[Series 16: STIR · sagittal · 3.0mm · 0.41mm/px · 2 of 21 slices shown]
[im 1/21]
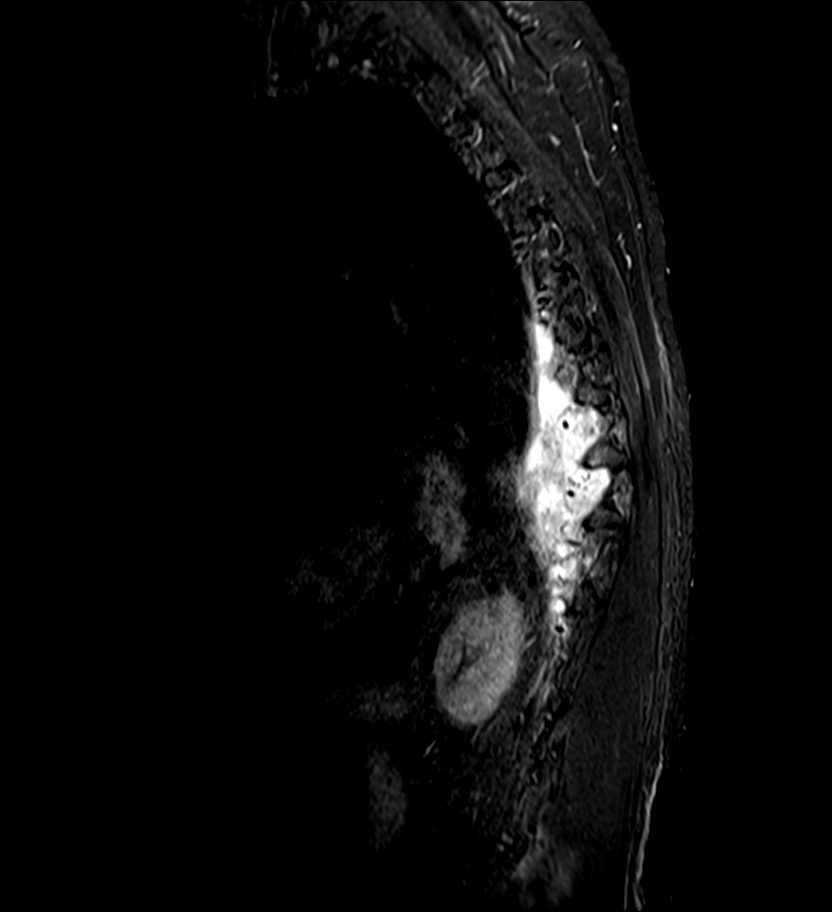
[im 11/21]
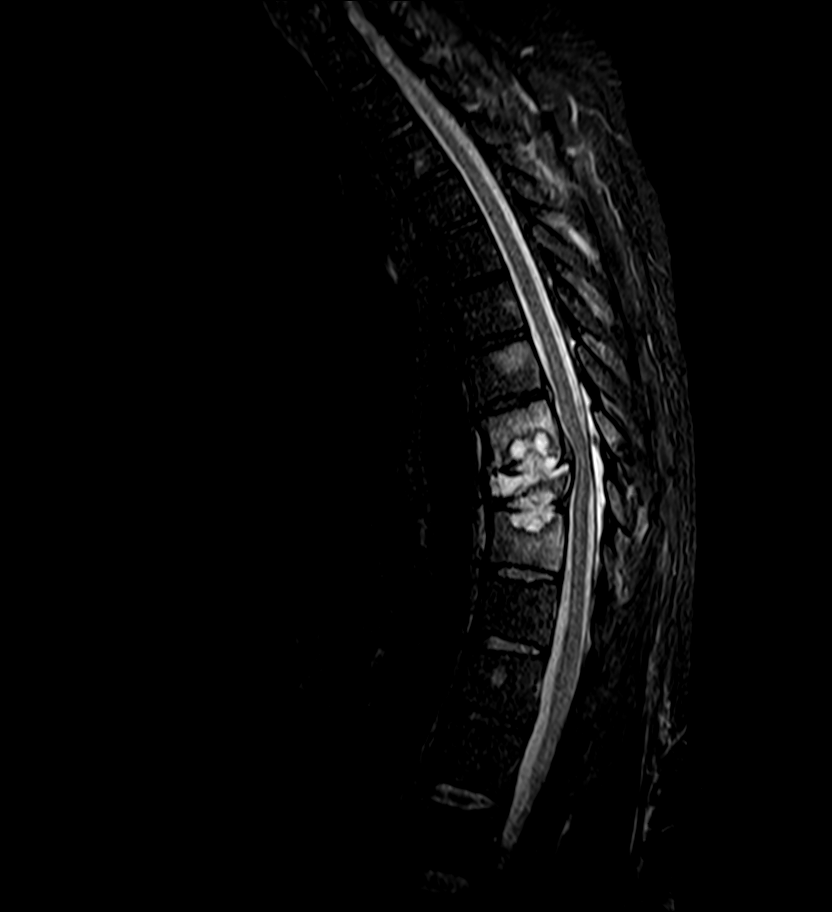

[Series 17: T1 · sagittal · 3.0mm · 1.06mm/px · 3 of 21 slices shown (2 of 3)]
[im 1/21]
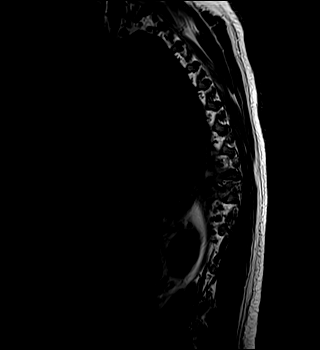
[im 11/21]
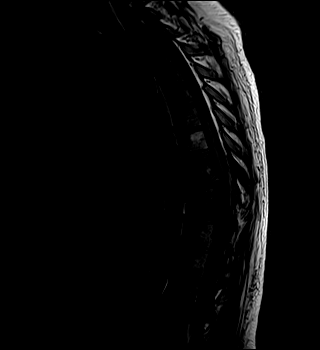
[im 21/21]
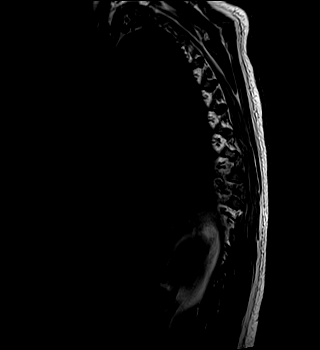

[Series 18: T2 · axial · 4.0mm · 0.74mm/px · z∈[-287,-54]mm · 5 of 42 slices shown (2 of 2)]
[im 1/42]
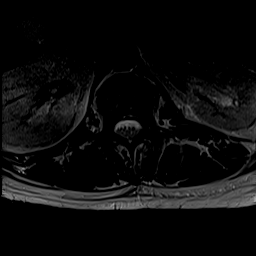
[im 11/42]
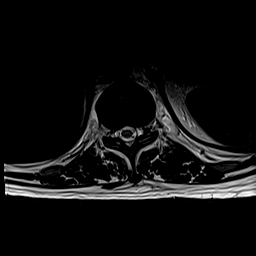
[im 21/42]
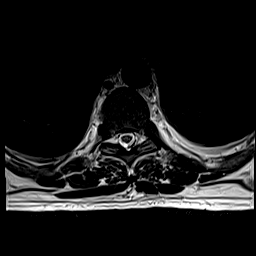
[im 31/42]
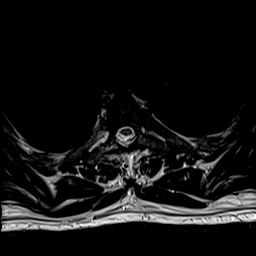
[im 42/42]
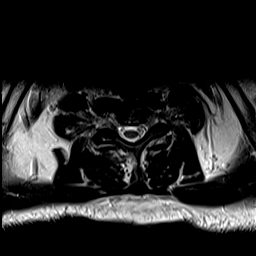

[Series 20: T1 · axial · non-contrast · 4.0mm · 0.37mm/px · z∈[-287,-54]mm · 5 of 42 slices shown (3 of 3)]
[im 1/42]
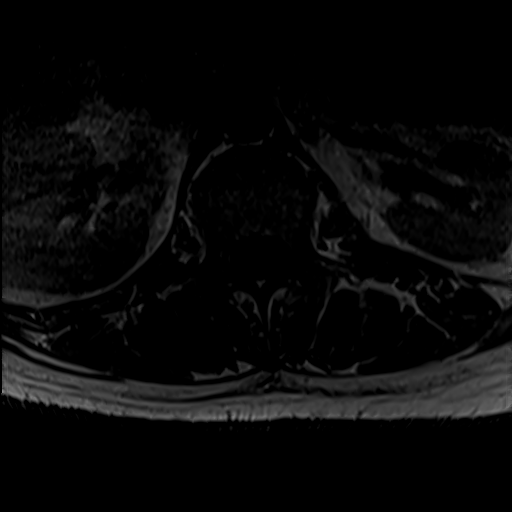
[im 11/42]
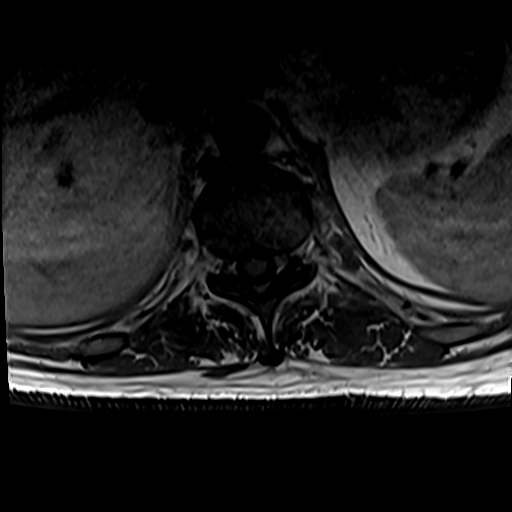
[im 21/42]
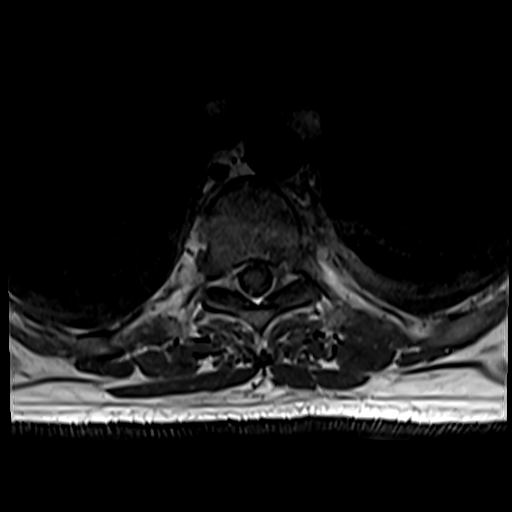
[im 31/42]
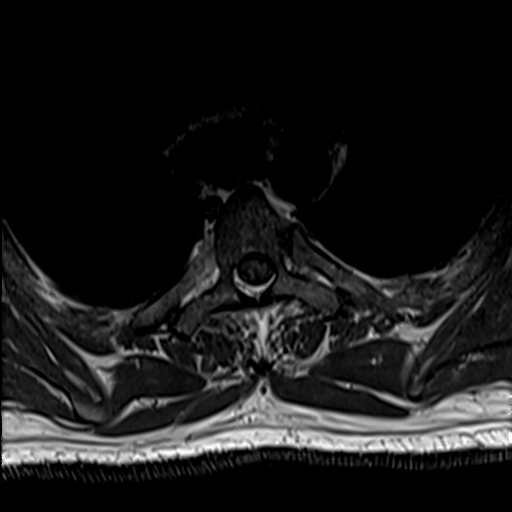
[im 42/42]
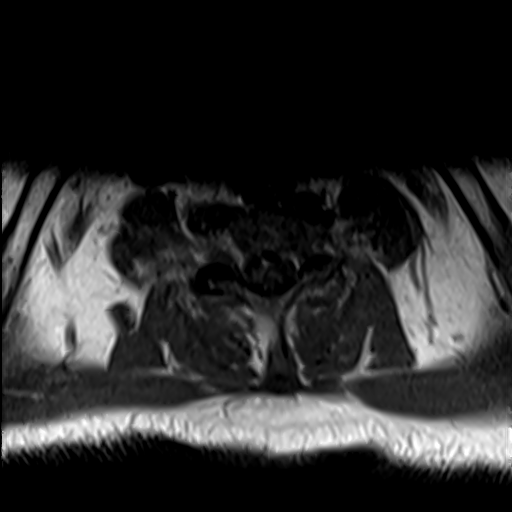

[Series 21: T1 fat-sat post-contrast · sagittal · 3.0mm · 1.06mm/px · 3 of 21 slices shown]
[im 1/21]
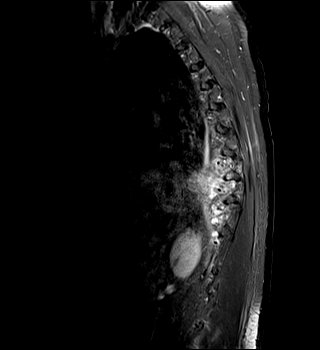
[im 11/21]
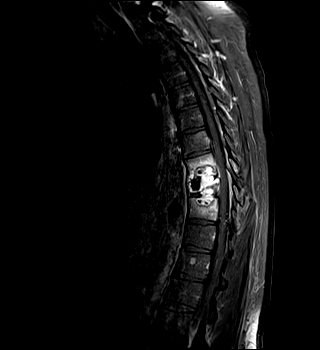
[im 21/21]
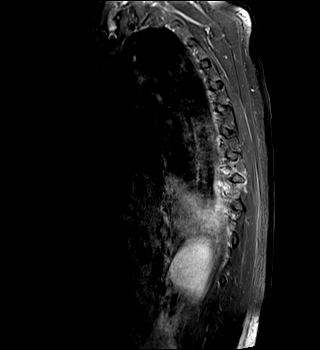

[22 of 48 positions shown; findings below may reference images not displayed]

FINDINGS: MRI THORACIC SPINE FINDINGS

Alignment:  Stable.

Vertebrae: Persistent abnormal signal and enhancement and T8-T10 and
intervening disc spaces. Pathologic fracture of T9 appears similar
with endplate retropulsion.

Hemangiomas are again identified within the T7, T10, and T12
vertebral bodies. There is persistent diffusely T1 hypointense
marrow signal.

Cord:  No definite abnormal cord signal.

Paraspinal and other soft tissues: Persistent paravertebral
inflammatory edema/enhancement extending from approximately T6-T7 to
T11-T12. There is persistent dorsal epidural enhancement again
extending also from approximately T6-T7 to T11-T12 levels.

Disc levels:

As before, ventral greater than dorsal epidural inflammatory changes
at T8-T9 and T9 levels with superimposed T8-T9 degenerative disc
disease and endplate retropulsion results in moderate to severe
canal stenosis with flattening of the ventral cord. Degree of
stenosis may be minimally improved. Persistent involvement of left
greater than right T8-T9 and T9-T10 neural foramina.
IMPRESSION: Persistent findings of discitis/osteomyelitis at T8-T10 with
pathologic T9 compression deformity. Similar extent of ventral and
dorsal epidural inflammatory change with stable to slightly
decreased moderate to severe spinal stenosis at T8-T9.

## 2019-04-24 MED ORDER — GADOBUTROL 1 MMOL/ML IV SOLN
8.0000 mL | Freq: Once | INTRAVENOUS | Status: AC | PRN
  Administered 2019-04-24: 8 mL via INTRAVENOUS

## 2019-04-24 NOTE — Progress Notes (Signed)
Pt's JP drain removed today per MD order.  Pt tolerated well.  Pt stable and will continue to be monitored.

## 2019-04-24 NOTE — Consult Note (Signed)
Chief Complaint   Chief Complaint  Patient presents with  . Dizziness    HPI   Consult requested by: Dr Benny Lennert, [facility] Reason for consult: Thoracic discitis osteomyleitis  HPI: Thomas [name] is a 44 y.o. male with history poorly controlled DM2, HTN, HDL & recent hospitalization at [facility] for disseminated MRSA bacteremia, thoracic discitis osteomyelitis who presented to ED with worsening back pain, fever, weakness on 12/29. CT abd/pelvis obtained by EDP showed progressive thoracic osteomyelitis. Dr Saintclair Halsted was called 12/29 and agreed to obtain MRI T spine for further evaluation.  MRI T spine notable for progressive T9 osteomyelitis, T9 vertebral body collapse, new T8-9, T9-10 discitis, T10 osteomyelitis. IR was consulted for aspiration. He underwent bx of T9 vertebral body on 1/5 by Dr Pascal Lux. Continues to have + blood cultures. Evaluated by ID. Current on ceftaroline and daptomycin.   Admission also notable for right digits 1-5 gangrene. Seen by vascular, Dr Carlis Abbott, s/p Harvest of right great saphenous vein, Right above-knee popliteal artery to tibioperoneal trunk bypass with reversed ipsilateral great saphenous vein on 1/7.   NSY re-consulted 1/9 for rec regarding the MRI findings. Complains of moderate thoracic back pain. Endorses RLE pain from prior vascular surgery, but denies true radicular symptoms. No weakness in legs.   Translator was used for assistance with history and exam.  Patient Active Problem List   Diagnosis Date Noted  . Discitis of thoracolumbar region   . MRSA bacteremia   . Endocarditis of mitral valve   . Rectal mass 04/14/2019  . DVT of popliteal vein ([place]) 04/14/2019  . Gangrene of toe of right foot ([place]) 04/14/2019  . Osteomyelitis of spine ([place]) 04/13/2019  . Diabetes mellitus type II, uncontrolled ([place]) 01/27/2015  . Diabetic neuropathy ([place]) 01/27/2015  . HTN (hypertension) 01/27/2015  . Depression 01/27/2015  . Hyperlipidemia 01/27/2015    PMH: Past  Medical History:  Diagnosis Date  . Depression 01/27/2015  . Diabetes mellitus without complication ([place])   . Hyperlipidemia   . Hypertension     PSH: Past Surgical History:  Procedure Laterality Date  . ABDOMINAL AORTOGRAM W/LOWER EXTREMITY Right 04/19/2019   Procedure: ABDOMINAL AORTOGRAM W/LOWER EXTREMITY;  Surgeon: Marty Heck, MD;  Location: [place] [facility];  Service: Cardiovascular;  Laterality: Right;  . FEMORAL-TIBIAL BYPASS GRAFT Right 04/22/2019   Procedure: Right  FEMORAL-TIBIAL ARTERY BYPASS GRAFT with Harvest of Saphenous vein RIGHT SUPERFICIAL FEMORAL ARTERY TO TIBIOPERONEAL TRUNK BYPASS;  Surgeon: Marty Heck, MD;  Location: [place];  Service: Vascular;  Laterality: Right;  . IR CERVICAL/THORACIC [place] W/IMAG GUIDE  04/15/2019  . IR FLUORO GUIDED NEEDLE PLC ASPIRATION/INJECTION LOC  04/15/2019  . kidney stones  2009  . PERIPHERAL VASCULAR INTERVENTION  04/19/2019   Procedure: PERIPHERAL VASCULAR INTERVENTION;  Surgeon: Marty Heck, MD;  Location: [place] [facility];  Service: Cardiovascular;;  right external iliac  . TEE WITHOUT CARDIOVERSION N/A 04/19/2019   Procedure: TRANSESOPHAGEAL ECHOCARDIOGRAM (TEE);  Surgeon: Donato Heinz, MD;  Location: [facility];  Service: Endoscopy;  Laterality: N/A;    Medications Prior to Admission  Medication Sig Dispense Refill Last Dose  . apixaban (ELIQUIS) 5 MG TABS tablet Take 1 tablet (5 mg total) by mouth daily. 60 tablet 1 04/11/2019  . gabapentin (NEURONTIN) 100 MG capsule TAKE ONE CAPSULE BY MOUTH AT BEDTIME (Patient taking differently: Take 100 mg by mouth at bedtime. ) 30 capsule 0 04/11/2019  . hydrochlorothiazide (HYDRODIURIL) 25 MG tablet TAKE ONE TABLET BY MOUTH  DAILY (Patient taking differently: Take 25 mg by mouth daily. ) 30 tablet 0 04/11/2019  . Insulin Glargine (LANTUS SOLOSTAR) 100 UNIT/ML Solostar Pen 31 units into skin q hs (Patient taking differently: Inject 31 Units into  the skin at bedtime. ) 5 pen 3 04/11/2019  . lisinopril (ZESTRIL) 20 MG tablet TAKE ONE TABLET BY MOUTH DAILY (Patient taking differently: Take 20 mg by mouth daily. ) 30 tablet 0 04/11/2019  . metFORMIN (GLUCOPHAGE-XR) 500 MG 24 hr tablet TAKE TWO TABLETS BY MOUTH TWICE A DAY (Patient taking differently: Take 1,000 mg by mouth 2 (two) times daily. ) 120 tablet 0 04/11/2019  . oxyCODONE-acetaminophen (PERCOCET/ROXICET) 5-325 MG tablet Take 1 tablet by mouth every 6 (six) hours as needed for severe pain. 12 tablet 0 04/11/2019  . lovastatin (MEVACOR) 40 MG tablet Take 1 tablet (40 mg total) by mouth at bedtime. (Patient not taking: Reported on 04/15/2019) 30 tablet 3 Not Taking at Unknown time  . rivaroxaban (XARELTO) 20 MG TABS tablet Take 1 tablet (20 mg total) by mouth daily with supper. (Patient not taking: Reported on 04/15/2019) 30 tablet 1 Not Taking at Unknown time    SH: Social History   Tobacco Use  . Smoking status: Former Games developer  . Smokeless tobacco: Never Used  Substance Use Topics  . Alcohol use: No    Alcohol/week: 0.0 standard drinks  . Drug use: No    MEDS: Prior to Admission medications   Medication Sig Start Date End Date Taking? Authorizing Provider  apixaban (ELIQUIS) 5 MG TABS tablet Take 1 tablet (5 mg total) by mouth daily. 04/13/19 08/11/19 Yes Saguier, Ramon Dredge, PA-C  gabapentin (NEURONTIN) 100 MG capsule TAKE ONE CAPSULE BY MOUTH AT BEDTIME Patient taking differently: Take 100 mg by mouth at bedtime.  01/27/19  Yes Saguier, Ramon Dredge, PA-C  hydrochlorothiazide (HYDRODIURIL) 25 MG tablet TAKE ONE TABLET BY MOUTH DAILY Patient taking differently: Take 25 mg by mouth daily.  01/27/19  Yes Saguier, Ramon Dredge, PA-C  Insulin Glargine (LANTUS SOLOSTAR) 100 UNIT/ML Solostar Pen 31 units into skin q hs Patient taking differently: Inject 31 Units into the skin at bedtime.  04/03/19  Yes Saguier, Ramon Dredge, PA-C  lisinopril (ZESTRIL) 20 MG tablet TAKE ONE TABLET BY MOUTH  DAILY Patient taking differently: Take 20 mg by mouth daily.  01/27/19  Yes Saguier, Ramon Dredge, PA-C  metFORMIN (GLUCOPHAGE-XR) 500 MG 24 hr tablet TAKE TWO TABLETS BY MOUTH TWICE A DAY Patient taking differently: Take 1,000 mg by mouth 2 (two) times daily.  01/27/19  Yes Saguier, Ramon Dredge, PA-C  oxyCODONE-acetaminophen (PERCOCET/ROXICET) 5-325 MG tablet Take 1 tablet by mouth every 6 (six) hours as needed for severe pain. 03/25/19  Yes Long, Arlyss Repress, MD  lovastatin (MEVACOR) 40 MG tablet Take 1 tablet (40 mg total) by mouth at bedtime. Patient not taking: Reported on 04/15/2019 04/13/19   Saguier, Ramon Dredge, PA-C  rivaroxaban (XARELTO) 20 MG TABS tablet Take 1 tablet (20 mg total) by mouth daily with supper. Patient not taking: Reported on 04/15/2019 04/13/19   Saguier, Ramon Dredge, PA-C    ALLERGY: No Known Allergies  Social History   Tobacco Use  . Smoking status: Former Games developer  . Smokeless tobacco: Never Used  Substance Use Topics  . Alcohol use: No    Alcohol/week: 0.0 standard drinks     History reviewed. No pertinent family history.   ROS   Review of Systems  Constitutional: Negative.   HENT: Negative.   Eyes: Negative.   Respiratory: Negative.   Cardiovascular:  Negative.   Gastrointestinal: Negative.   Genitourinary: Negative.   Musculoskeletal: Positive for back pain and myalgias.  Skin: Negative.   Neurological: Negative for dizziness, tingling, tremors, sensory change, speech change, focal weakness, seizures, loss of consciousness, weakness and headaches.    Exam   Vitals:   04/24/19 0450 04/24/19 0823  BP:  (!) 146/106  Pulse:    Resp: 17   Temp:  97.9 F (36.6 C)  SpO2: 100%    General appearance: WDWN, NAD Eyes: No scleral injection Cardiovascular: Regular rate and rhythm without murmurs, rubs, gallops. No edema or variciosities. Distal pulses normal. Pulmonary: Effort normal, non-labored breathing Musculoskeletal:     Muscle tone upper extremities: Normal     Muscle tone lower extremities: Normal    Motor exam: grossly normal in all extremities although RLE limited due to pain from surgery Neurological Mental Status:    - Patient is awake, alert, oriented to person, place, month, year, and situation    - Patient is able to give a clear and coherent history.    - No signs of aphasia or neglect Cranial Nerves    - II: Visual Fields are full. PERRL    - III/IV/VI: EOMI without ptosis or diploplia.     - V: Facial sensation is grossly normal    - VII: Facial movement is symmetric.     - VIII: hearing is intact to voice    - X: Uvula elevates symmetrically    - XI: Shoulder shrug is symmetric.    - XII: tongue is midline without atrophy or fasciculations.  Decreased sensation RLE  Results - Imaging/Labs   Results for orders placed or performed during the hospital encounter of 04/13/19 (from the past 48 hour(s))  POCT Activated clotting time     Status: None   Collection Time: 04/22/19 10:07 AM  Result Value Ref Range   Activated Clotting Time 202 seconds  POCT Activated clotting time     Status: None   Collection Time: 04/22/19 10:18 AM  Result Value Ref Range   Activated Clotting Time 219 seconds  POCT Activated clotting time     Status: None   Collection Time: 04/22/19 10:28 AM  Result Value Ref Range   Activated Clotting Time 252 seconds  Glucose, capillary     Status: Abnormal   Collection Time: 04/22/19 12:40 PM  Result Value Ref Range   Glucose-Capillary 168 (H) 70 - 99 mg/dL  Glucose, capillary     Status: Abnormal   Collection Time: 04/22/19  5:48 PM  Result Value Ref Range   Glucose-Capillary 144 (H) 70 - 99 mg/dL   Comment 1 Notify RN    Comment 2 Document in Chart   Glucose, capillary     Status: Abnormal   Collection Time: 04/22/19  9:36 PM  Result Value Ref Range   Glucose-Capillary 144 (H) 70 - 99 mg/dL  Glucose, capillary     Status: Abnormal   Collection Time: 04/23/19  2:07 AM  Result Value Ref Range    Glucose-Capillary 138 (H) 70 - 99 mg/dL  CBC     Status: Abnormal   Collection Time: 04/23/19  5:10 AM  Result Value Ref Range   WBC 6.2 4.0 - 10.5 K/uL   RBC 2.19 (L) 4.22 - 5.81 MIL/uL   Hemoglobin 5.3 (LL) 13.0 - 17.0 g/dL    Comment: REPEATED TO VERIFY THIS CRITICAL RESULT HAS VERIFIED AND BEEN CALLED TO T. PATAKY, RN BY JULIE MACEDA DEL ANGEL ON  01 08 2021 AT 0634, AND HAS BEEN READ BACK.     HCT 17.4 (L) 39.0 - 52.0 %   MCV 79.5 (L) 80.0 - 100.0 fL   MCH 24.2 (L) 26.0 - 34.0 pg   MCHC 30.5 30.0 - 36.0 g/dL   RDW 25.3 66.4 - 40.3 %   Platelets 421 (H) 150 - 400 K/uL   nRBC 0.0 0.0 - 0.2 %    Comment: Performed at [facility], [address], [place], [place] [address]  Basic metabolic panel     Status: Abnormal   Collection Time: 04/23/19  5:10 AM  Result Value Ref Range   Sodium 136 135 - 145 mmol/L   Potassium 3.6 3.5 - 5.1 mmol/L   Chloride 102 98 - 111 mmol/L   CO2 26 22 - 32 mmol/L   Glucose, Bld 142 (H) 70 - 99 mg/dL   BUN 9 6 - 20 mg/dL   Creatinine, Ser 9.56 0.61 - 1.24 mg/dL   Calcium 8.1 (L) 8.9 - 10.3 mg/dL   GFR calc non Af Amer >60 >60 mL/min   GFR calc Af Amer >60 >60 mL/min   Anion gap 8 5 - 15    Comment: Performed at [facility], [address], [place], [place] [address]  Glucose, capillary     Status: Abnormal   Collection Time: 04/23/19  6:15 AM  Result Value Ref Range   Glucose-Capillary 140 (H) 70 - 99 mg/dL  Prepare RBC     Status: None   Collection Time: 04/23/19  7:00 AM  Result Value Ref Range   Order Confirmation      ORDER PROCESSED BY BLOOD BANK Performed at [facility], [address], [place], [place] [address]   CK     Status: Abnormal   Collection Time: 04/23/19  8:31 AM  Result Value Ref Range   Total CK 34 (L) 49 - 397 U/L    Comment: Performed at [facility], [address], [place], [place] [address]  Glucose, capillary     Status: Abnormal   Collection Time: 04/23/19  1:40 PM  Result Value Ref  Range   Glucose-Capillary 173 (H) 70 - 99 mg/dL  Hemoglobin and hematocrit, blood     Status: Abnormal   Collection Time: 04/23/19  8:01 PM  Result Value Ref Range   Hemoglobin 7.3 (L) 13.0 - 17.0 g/dL    Comment: REPEATED TO VERIFY POST TRANSFUSION SPECIMEN CRITICAL VALUE NOTED.  VALUE IS CONSISTENT WITH PREVIOUSLY REPORTED AND CALLED VALUE.    HCT 22.0 (L) 39.0 - 52.0 %    Comment: Performed at [facility], [address], [place], [place] [address]  CK     Status: Abnormal   Collection Time: 04/24/19  4:47 AM  Result Value Ref Range   Total CK 40 (L) 49 - 397 U/L    Comment: Performed at [facility], [address], [place], [place] [address]  CBC with Differential/Platelet     Status: Abnormal   Collection Time: 04/24/19  4:47 AM  Result Value Ref Range   WBC 8.1 4.0 - 10.5 K/uL   RBC 2.91 (L) 4.22 - 5.81 MIL/uL   Hemoglobin 7.4 (L) 13.0 - 17.0 g/dL   HCT 10.9 (L) 32.3 - 55.7 %   MCV 81.1 80.0 - 100.0 fL   MCH 25.4 (L) 26.0 - 34.0 pg   MCHC 31.4 30.0 - 36.0 g/dL  RDW 15.6 (H) 11.5 - 15.5 %   Platelets 426 (H) 150 - 400 K/uL   nRBC 0.0 0.0 - 0.2 %   Neutrophils Relative % 68 %   Neutro Abs 5.5 1.7 - 7.7 K/uL   Lymphocytes Relative 21 %   Lymphs Abs 1.7 0.7 - 4.0 K/uL   Monocytes Relative 9 %   Monocytes Absolute 0.8 0.1 - 1.0 K/uL   Eosinophils Relative 1 %   Eosinophils Absolute 0.1 0.0 - 0.5 K/uL   Basophils Relative 0 %   Basophils Absolute 0.0 0.0 - 0.1 K/uL   Immature Granulocytes 1 %   Abs Immature Granulocytes 0.05 0.00 - 0.07 K/uL    Comment: Performed at [facility], [address] [place], [place] [address]  Basic metabolic panel     Status: Abnormal   Collection Time: 04/24/19  4:47 AM  Result Value Ref Range   Sodium 135 135 - 145 mmol/L   Potassium 3.7 3.5 - 5.1 mmol/L   Chloride 103 98 - 111 mmol/L   CO2 25 22 - 32 mmol/L   Glucose, Bld 155 (H) 70 - 99 mg/dL   BUN 7 6 - 20 mg/dL   Creatinine, Ser 9.140.88 0.61 - 1.24 mg/dL    Calcium 8.2 (L) 8.9 - 10.3 mg/dL   GFR calc non Af Amer >60 >60 mL/min   GFR calc Af Amer >60 >60 mL/min   Anion gap 7 5 - 15    Comment: Performed at [facility], [address] [place], [place] [address]  Glucose, capillary     Status: Abnormal   Collection Time: 04/24/19  6:11 AM  Result Value Ref Range   Glucose-Capillary 155 (H) 70 - 99 mg/dL  Glucose, capillary     Status: Abnormal   Collection Time: 04/24/19  8:22 AM  Result Value Ref Range   Glucose-Capillary 127 (H) 70 - 99 mg/dL    No results found.  Impression/Plan   44 y.o. male with worsening T9 discitis osteomyelitis with vertebral body collapse, new discitis at T8-9, T9-10 and T10 osteomyelitis. Worsening mild-mod spinal stenosis at T8-9. Bacteremic. Also with critical ischemia of RLE managed by vascular, s/p right SFA to TP trunk bypass for popliteal occlusion. While RLE exam is limited, he is grossly neurologically intact.  No emergent need for NS intervention at present. Will obtain repeat MRI T spine w/wo contrast to re-evaluate the spinal stenosis. If improving, do not anticipate surgery. I have ordered a TLSO which may help with some of his back pain. Can be worn when upright and OOB.  Cindra PresumeVincent Love Chowning, PA-C [facility]

## 2019-04-24 NOTE — Progress Notes (Signed)
PROGRESS NOTE  [name] FXT:[id number] DOB: [birth year] DOA: 04/13/2019 PCP: [name], PA-C  Brief History   Thomas [name] a 44 y.o.malewith medical history significant ofDM2 poorly controlled.  Recent hospitalization at [facility] for disseminated MRSA bacteremia, osteomyelitis and discitis.  Completed 6 weeks course of IV antibiotics on April 04, 2019.  Source of bacteremia thought to be from skin source (per previous facility report, area on toe that appeared cellulitic, area in R back that was erythematous and an abrasion of the lateral R aspect of his shin, abscess formation in the R calf and pyomyositis of the L vastus intermedius).  Developed necrosis of his R 1-5 digits since his discharge.    Presented here with worsening back pain, generalized weakness, fever and recent fall.  CT chest, abdomen and pelvis notable for findings of advanced osteomyelitis involving T9 as well as discitis.  Positive blood cultures for MRSA.  Seen by infectious disease, recommendations for IV Vancomycin.  Planned TEE on Monday.  Vascular surgery following for right 1st-5th toe dry gangrene. However, he wants to wait until current infection in controlled before proceeding with aortogram etc to address PAD and dry gangrene.  Pt underwent TEE on 04/19/2019 which did not demonstrate vegetations or other evidence of persistent endocarditis.   He was also taken to the vascular lab by Dr. Chestine Spore for Aortogram, bilateral lower extremity arteriogram with runoff, right external iliac artery angioplasty with stent placement under moderate conscious sedation. This was performed critical ischemia of the right lower extremity with gangrene of digits 1-5 of the right foot. Dr. Chestine Spore performed a right SFA to TP trunk bypass for popliteal occlusion in addition to iliac lesion on 04/22/2019. The patient apparently developed a thigh hematoma last night. He is receiving transfusion of 2 units PRBC's as his  hemoglobin of 5.3 this morning. ACE wrap has been placed on thigh.  Neurosurgery was reconsulted on 04/24/2019 on the advice of ID for re-evaluation of thoracic spine discitis due to concern for persistent infection in this area. I appreciate neurosurgery's assistance.  Consultants  . Vascular surgery . Interventional radiology . Infectious disease . Wound Care . Neurosurgery  Procedures  . Aspiration of T9 to guide further therapy . Angiogram of bilateral lower extremities with stent placement in Rt EIA 04/19/2019.  Antibiotics   Anti-infectives (From admission, onward)   Start     Dose/Rate Route Frequency Ordered Stop   04/22/19 2000  DAPTOmycin (CUBICIN) 850 mg in sodium chloride 0.9 % IVPB     850 mg 234 mL/hr over 30 Minutes Intravenous Daily 04/22/19 1652     04/22/19 1700  ceftaroline (TEFLARO) 600 mg in sodium chloride 0.9 % 250 mL IVPB     600 mg 250 mL/hr over 60 Minutes Intravenous Every 8 hours 04/22/19 1652     04/22/19 1600  ceFAZolin (ANCEF) IVPB 2g/100 mL premix     2 g 200 mL/hr over 30 Minutes Intravenous Every 8 hours 04/22/19 1525 04/23/19 0217   04/22/19 0600  ceFAZolin (ANCEF) IVPB 2g/100 mL premix    Note to Pharmacy: Send with pt to OR   2 g 200 mL/hr over 30 Minutes Intravenous To Short Stay 04/21/19 0743 04/22/19 0813   04/18/19 1800  vancomycin (VANCOCIN) IVPB 1000 mg/200 mL premix  Status:  Discontinued     1,000 mg 200 mL/hr over 60 Minutes Intravenous Every 12 hours 04/18/19 0718 04/22/19 1652   04/14/19 1600  vancomycin (VANCOREADY) IVPB 1500 mg/300 mL  Status:  Discontinued     1,500 mg 150 mL/hr over 120 Minutes Intravenous Every 12 hours 04/14/19 1240 04/18/19 0718   04/13/19 2200  ceFEPIme (MAXIPIME) 2 g in sodium chloride 0.9 % 100 mL IVPB  Status:  Discontinued     2 g 200 mL/hr over 30 Minutes Intravenous Every 8 hours 04/13/19 1428 04/14/19 1028   04/13/19 1430  vancomycin (VANCOCIN) IVPB 1000 mg/200 mL premix  Status:  Discontinued      1,000 mg 200 mL/hr over 60 Minutes Intravenous Every 12 hours 04/13/19 1428 04/14/19 1240   04/13/19 1415  ceFEPIme (MAXIPIME) 2 g in sodium chloride 0.9 % 100 mL IVPB     2 g 200 mL/hr over 30 Minutes Intravenous  Once 04/13/19 1411 04/13/19 1505   04/13/19 1415  metroNIDAZOLE (FLAGYL) IVPB 500 mg     500 mg 100 mL/hr over 60 Minutes Intravenous  Once 04/13/19 1411 04/13/19 1600   04/13/19 1415  vancomycin (VANCOCIN) IVPB 1000 mg/200 mL premix  Status:  Discontinued     1,000 mg 200 mL/hr over 60 Minutes Intravenous  Once 04/13/19 1411 04/13/19 1428      Subjective  The patient is resting comfortably. No new complaints.  Objective   Vitals:  Vitals:   04/24/19 1226 04/24/19 1331  BP:    Pulse:    Resp: 17   Temp:  99.2 F (37.3 C)  SpO2: 90% 99%   Exam:  Constitutional:  . The patient is awake, alert, and oriented x 3. No acute distress. Respiratory:  . No increased work of breathing. . No wheezes, rales, or rhonchi . No tactile fremitus Cardiovascular:  . Regular rate and rhythm . No murmurs, ectopy, or gallups. . No lateral PMI. No thrills. Abdomen:  . Abdomen is soft, non-tender, non-distended . No hernias, masses, or organomegaly . Normoactive bowel sounds.  Musculoskeletal:  . No cyanosis or clubbing . 2-3+ pitting edema of lower extremities bilaterally . All 5 digits on right foot are blackened with dry gangrene. Skin:  . No rashes, lesions, ulcers . palpation of skin: no induration or nodules . Blackening of distal aspect of 5 digits on right foot. Neurologic:  . CN 2-12 intact . Sensation all 4 extremities intact Psychiatric:  . Mental status o Mood, affect appropriate o Orientation to person, place, time  . judgment and insight appear intact  I have personally reviewed the following:   Today's Data  . Vitals, CBC, BMP  Micro Data  . No growth of surveillance cultures.  Scheduled Meds: . sodium chloride   Intravenous Once  .  atorvastatin  10 mg Oral q1800  . Chlorhexidine Gluconate Cloth  6 each Topical Daily  . clopidogrel  75 mg Oral Q breakfast  . docusate sodium  100 mg Oral Daily  . HYDROmorphone   Intravenous Q4H  . insulin aspart  0-9 Units Subcutaneous TID WC  . insulin glargine  15 Units Subcutaneous QHS  . pantoprazole  40 mg Oral Daily  . pneumococcal 23 valent vaccine  0.5 mL Intramuscular Tomorrow-1000  . saccharomyces boulardii  250 mg Oral BID  . senna-docusate  2 tablet Oral BID  . sodium chloride flush  10-40 mL Intracatheter Q12H  . sodium chloride flush  3 mL Intravenous Once  . sodium chloride flush  3 mL Intravenous Q12H   Continuous Infusions: . sodium chloride 250 mL (04/13/19 2351)  . sodium chloride Stopped (04/13/19 2230)  . sodium chloride    . sodium chloride 100  mL/hr at 04/23/19 0951  . ceFTAROline (TEFLARO) IV 600 mg (04/24/19 0630)  . DAPTOmycin (CUBICIN)  IV 850 mg (04/23/19 2045)  . lactated ringers 100 mL/hr at 04/21/19 0400  . 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 Infectious disease has been consulted, recommendation for IV vancomycin for now. Repeated blood cultures on 04/14/2019 - to date. Continue to follow cultures. MRI independently reviewed showed progressive T9 osteomyelitis with increased cultures evaluation and vertebral body collapse.  New discitis at T8-9 and T9-T10 with T8 and T10 osteomyelitis.  Moderate to severe spinal stenosis at T8-T9. Persistent extensive anterior paravertebral soft  tissue inflammation/phlegmon. Decreased posterior paraspinal inflammation without residual or new abscess. Cardiology has been consulted for TEE to assess for endocarditis. This was performed on 04/18/2018 and demonstrated no vegetations or evidence of persistent endocarditis. Pain management in place. Continue bowel regimen.  MRSA bacteremia: Management as stated above. Currently afebrile with no leukocytosis. Continue to monitor WBC and fever curve.  T9 abscess status post aspiration by interventional radiology on 04/15/2019: Deep tissue culture growing staph aureus, continue to monitor cultures. Continue pain management and IV antibiotics. Repeat MRI has been ordered and neurosurgery has been re-consulted. I appreciate their assistance.  Recurrent thoracic vertebral osteomyelitis: Previously treated with 6 weeks of IV antibiotics with IV daptomycin on 04/04/2019. Significantly elevated CRP 19.4 and sed rate >140. Management as per above. Repeat MRI has been ordered and neurosurgery has been re-consulted. I appreciate their assistance.  Peripheral artery disease: Right first through fifth toes dry gangrene: Vascular surgery following with possible intervention on Monday with Dr. Carlis Abbott. He wishes to wait to ensure clearance of bacteremia prior to procedure in case a stent is needed. The patient was quite upset upon hearing this. He believed that his toes were only "dry". The patient was taken to the vascular lab by Dr. Carlis Abbott today where he underwent bilateral arteriogram with bilateral runoff and stent placement in Rt EIA. The patient returned to vascular lab on 04/22/2019 for  right SFA to TP trunk bypass for popliteal occlusion in addition to iliac lesion.   Tachycardia: Likely related to intraoperative volume loss. Will give a bolus of NS.   History of DVT: On Eliquis which is on hold due to possible procedure, held since 04/14/2019.  Possible rectal mass: Monitor. May require GI evaluation when  more stable and bacteremia has cleared.  Type 2 diabetes with hyperglycemia: Hemoglobin A1c 8.3 on 04/14/2019. Continue insulin coverage. FSBS 138- 168 in the last 24 hours.  Essential hypertension: Continue to hold off antihypertensives for now due to soft blood pressure.  Hyperlipidemia: Continue Robaxin.  Diabetic polyneuropathy: Continue gabapentin  I have seen and examined this patient myself. I have spent 30 minutes in his evaluation and care.  DVT prophylaxis: SCDs. Code Status:Full Family Communication: None at bedside. Disposition Plan: Home when hemodynamically stable, vascular surgery and infectious disease have signed off.    Lynnley Doddridge, DO [facility] Direct contact: see  [email or web address]  7PM-7AM contact night coverage as above 04/24/2019, 1:58 PM  LOS: 4 days

## 2019-04-24 NOTE — Progress Notes (Addendum)
[facility]  Subjective  - No new complaints.     Objective (!) 146/106 92 97.9 F (36.6 C) (Axillary) 17 100%  Intake/Output Summary (Last 24 hours) at 04/24/2019 0847 Last data filed at 04/24/2019 0400 Gross per 24 hour  Intake 695 ml  Output 1700 ml  Net -1005 ml    Right Bypass incisions healing well with PT/DP/Peroneal doppler signals intact. Sensation and motor intact B LE, no change in tips of toes dry gangrene. Lungs non labored breathing Gen NAD A & O x 3  Assessment/Planning: POD # 2 44 y.o. male is s/p:  1. Harvest of right great saphenous vein 2. Right above-knee popliteal artery to tibioperoneal trunk bypass with reversed ipsilateral great saphenous vein 3. Placement of 15 French round Blake drain in the right popliteal space  HGB 7.4 asymptomatic  chronic anemia and surgical blood loss after 2 units PRBC  Will discharge Drain today  Encouraged mobility as tolerates Patent arterial bypass with brisk doppler signals    Mosetta Pigeon 04/24/2019 8:47 AM --  Laboratory Lab Results: Recent Labs    04/23/19 0510 04/23/19 2001 04/24/19 0447  WBC 6.2  --  8.1  HGB 5.3* 7.3* 7.4*  HCT 17.4* 22.0* 23.6*  PLT 421*  --  426*   BMET Recent Labs    04/23/19 0510 04/24/19 0447  NA 136 135  K 3.6 3.7  CL 102 103  CO2 26 25  GLUCOSE 142* 155*  BUN 9 7  CREATININE 0.86 0.88  CALCIUM 8.1* 8.2*    COAG Lab Results  Component Value Date   INR 1.0 04/22/2019   INR 1.3 (H) 04/13/2019   No results found for: PTT  I agree with the above.  I have seen and examined the patient.  He is POD#3.JP will be removed today as there was minimal output.  Excellent doppler signals.  encourage ambulation.  Continue Statin and DAPT  Durene Cal

## 2019-04-25 LAB — GLUCOSE, CAPILLARY
Glucose-Capillary: 120 mg/dL — ABNORMAL HIGH (ref 70–99)
Glucose-Capillary: 135 mg/dL — ABNORMAL HIGH (ref 70–99)
Glucose-Capillary: 159 mg/dL — ABNORMAL HIGH (ref 70–99)
Glucose-Capillary: 170 mg/dL — ABNORMAL HIGH (ref 70–99)

## 2019-04-25 LAB — CBC WITH DIFFERENTIAL/PLATELET
Abs Immature Granulocytes: 0.02 10*3/uL (ref 0.00–0.07)
Basophils Absolute: 0 10*3/uL (ref 0.0–0.1)
Basophils Relative: 0 %
Eosinophils Absolute: 0.1 10*3/uL (ref 0.0–0.5)
Eosinophils Relative: 2 %
HCT: 22 % — ABNORMAL LOW (ref 39.0–52.0)
Hemoglobin: 7 g/dL — ABNORMAL LOW (ref 13.0–17.0)
Immature Granulocytes: 0 %
Lymphocytes Relative: 23 %
Lymphs Abs: 1.3 10*3/uL (ref 0.7–4.0)
MCH: 25.9 pg — ABNORMAL LOW (ref 26.0–34.0)
MCHC: 31.8 g/dL (ref 30.0–36.0)
MCV: 81.5 fL (ref 80.0–100.0)
Monocytes Absolute: 0.6 10*3/uL (ref 0.1–1.0)
Monocytes Relative: 11 %
Neutro Abs: 3.8 10*3/uL (ref 1.7–7.7)
Neutrophils Relative %: 64 %
Platelets: 388 10*3/uL (ref 150–400)
RBC: 2.7 MIL/uL — ABNORMAL LOW (ref 4.22–5.81)
RDW: 15.4 % (ref 11.5–15.5)
WBC: 5.8 10*3/uL (ref 4.0–10.5)
nRBC: 0 % (ref 0.0–0.2)

## 2019-04-25 LAB — BASIC METABOLIC PANEL
Anion gap: 7 (ref 5–15)
BUN: 5 mg/dL — ABNORMAL LOW (ref 6–20)
CO2: 26 mmol/L (ref 22–32)
Calcium: 8.4 mg/dL — ABNORMAL LOW (ref 8.9–10.3)
Chloride: 103 mmol/L (ref 98–111)
Creatinine, Ser: 0.81 mg/dL (ref 0.61–1.24)
GFR calc Af Amer: >60 mL/min (ref >60)
GFR calc non Af Amer: >60 mL/min (ref >60)
Glucose, Bld: 114 mg/dL — ABNORMAL HIGH (ref 70–99)
Potassium: 3.5 mmol/L (ref 3.5–5.1)
Sodium: 136 mmol/L (ref 135–145)

## 2019-04-25 NOTE — Progress Notes (Addendum)
[facility]  Subjective  - No new complaints.  He ambulates with rolling walker from bed to chair.  Spoke with patient using interpreter IPAD.    Objective 135/77 85 98.7 F (37.1 C) (Oral) 15 100%  Intake/Output Summary (Last 24 hours) at 04/25/2019 0754 Last data filed at 04/25/2019 0500 Gross per 24 hour  Intake --  Output 1800 ml  Net -1800 ml    Right groin incision healing well without hematoma, leg incisions healing well. Dry gangrene right toe tips, pending demarcation.  Active range of motion and sensation intact. Doppler signals brisk DP/PT right > monophasic peroneal/PT left No evidence of left foot ischemia  Heart RRR Lungs non labored breathing  Assessment/Planning: POD # 3 44 y.o.maleis s/p:  1. Harvest of right great saphenous vein 2. Right above-knee popliteal artery to tibioperoneal trunk bypass with reversed ipsilateral great saphenous vein 3. Placement of 15 French round Blake drain in the right popliteal space  JP drain site without active drainage of bleeding. Chronic anemia and surgical blood loss anemia asymptomatic.  HGB 7.0 today down from 7.4 04/23/18.  No hematoma surrounding incisions.  History of DVT: On Eliquis which is on hold due to possible procedure, heldsince 04/14/2019.   EXAM: RIGHT LOWER EXTREMITY VENOUS DOPPLER ULTRASOUND  TECHNIQUE: Gray-scale sonography with compression, as well as color and duplex ultrasound, were performed to evaluate the deep venous system from the level of the common femoral vein through the popliteal and proximal calf veins.  COMPARISON:  CT tib fib right 03/03/2019  FINDINGS: Normal compressibility of the common femoral, superficial femoral, and popliteal veins. There is hypoechoic occlusive thrombus in posterior tibial and peroneal veins.  Survey views of the contralateral common femoral vein are unremarkable.  Morphologically unremarkable inguinal lymph  nodes noted bilaterally.  IMPRESSION: 1. POSITIVE for isolated right calf (posterior tibial and peroneal) DVT.   Electronically Signed   By: Corlis Leak M.D.   On: 03/25/2019 16:11 Patent  right LE bypass with brisk dopplers.  Will allow toes to demarcate.    Thomas [name] 04/25/2019 7:54 AM --  Laboratory Lab Results: Recent Labs    04/24/19 0447 04/25/19 0520  WBC 8.1 5.8  HGB 7.4* 7.0*  HCT 23.6* 22.0*  PLT 426* 388   BMET Recent Labs    04/24/19 0447 04/25/19 0520  NA 135 136  K 3.7 3.5  CL 103 103  CO2 25 26  GLUCOSE 155* 114*  BUN 7 5*  CREATININE 0.88 0.81  CALCIUM 8.2* 8.4*    COAG Lab Results  Component Value Date   INR 1.0 04/22/2019   INR 1.3 (H) 04/13/2019   No results found for: PTT  Communication via interpreter.  He denies pain.  Doppler pedal signals.  Continues to have eschar to the toes, worse on #3.  Incisions all intact.  Thomas [name]

## 2019-04-25 NOTE — Progress Notes (Signed)
PROGRESS NOTE  [name] DOB: [birth year] DOA: 04/13/2019 PCP: [name], PA-C  Brief History   Thomas [name] a 44 y.o.malewith medical history significant ofDM2 poorly controlled.  Recent hospitalization at [facility] for disseminated MRSA bacteremia, osteomyelitis and discitis.  Completed 6 weeks course of IV antibiotics on April 04, 2019.  Source of bacteremia thought to be from skin source (per previous facility report, area on toe that appeared cellulitic, area in R back that was erythematous and an abrasion of the lateral R aspect of his shin, abscess formation in the R calf and pyomyositis of the L vastus intermedius).  Developed necrosis of his R 1-5 digits since his discharge.    Presented here with worsening back pain, generalized weakness, fever and recent fall.  CT chest, abdomen and pelvis notable for findings of advanced osteomyelitis involving T9 as well as discitis.  Positive blood cultures for MRSA.  Seen by infectious disease, recommendations for IV Vancomycin.  Planned TEE on Monday.  Vascular surgery following for right 1st-5th toe dry gangrene. However, he wants to wait until current infection in controlled before proceeding with aortogram etc to address PAD and dry gangrene.  Pt underwent TEE on 04/19/2019 which did not demonstrate vegetations or other evidence of persistent endocarditis.   He was also taken to the vascular lab by Dr. Chestine Spore for Aortogram, bilateral lower extremity arteriogram with runoff, right external iliac artery angioplasty with stent placement under moderate conscious sedation. This was performed critical ischemia of the right lower extremity with gangrene of digits 1-5 of the right foot. Dr. Chestine Spore performed a right SFA to TP trunk bypass for popliteal occlusion in addition to iliac lesion on 04/22/2019. The patient apparently developed a thigh hematoma last night. He is receiving transfusion of 2 units PRBC's as his  hemoglobin of 5.3 this morning. ACE wrap has been placed on thigh.  Neurosurgery was reconsulted on 04/24/2019 on the advice of ID for re-evaluation of thoracic spine discitis due to concern for persistent infection in this area. I appreciate neurosurgery's assistance. Dr. [name] has continued to recommend medical management of the patient's T8-T-10 osteomyelitis/diskitis. The patient is a poor surgical candidate. Surgical approach will be withheld pending neurologic decline related to stenosis or subacute/chronic development of significant kyphotic deformity requiring stabilization. TLSO brace has been ordered.  Consultants   Vascular surgery  Interventional radiology  Infectious disease  Wound Care  Neurosurgery  Procedures   Aspiration of T9 to guide further therapy  Angiogram of bilateral lower extremities with stent placement in Rt EIA 04/19/2019.  Antibiotics   Anti-infectives (From admission, onward)   Start     Dose/Rate Route Frequency Ordered Stop   04/22/19 2000  DAPTOmycin (CUBICIN) 850 mg in sodium chloride 0.9 % IVPB     850 mg 234 mL/hr over 30 Minutes Intravenous Daily 04/22/19 1652     04/22/19 1700  ceftaroline (TEFLARO) 600 mg in sodium chloride 0.9 % 250 mL IVPB     600 mg 250 mL/hr over 60 Minutes Intravenous Every 8 hours 04/22/19 1652     04/22/19 1600  ceFAZolin (ANCEF) IVPB 2g/100 mL premix     2 g 200 mL/hr over 30 Minutes Intravenous Every 8 hours 04/22/19 1525 04/23/19 0217   04/22/19 0600  ceFAZolin (ANCEF) IVPB 2g/100 mL premix    Note to Pharmacy: Send with pt to OR   2 g 200 mL/hr over 30 Minutes Intravenous To Short Stay 04/21/19 0743 04/22/19 0813   04/18/19 1800  vancomycin (VANCOCIN) IVPB 1000 mg/200 mL premix  Status:  Discontinued     1,000 mg 200 mL/hr over 60 Minutes Intravenous Every 12 hours 04/18/19 0718 04/22/19 1652   04/14/19 1600  vancomycin (VANCOREADY) IVPB 1500 mg/300 mL  Status:  Discontinued     1,500 mg 150 mL/hr over 120  Minutes Intravenous Every 12 hours 04/14/19 1240 04/18/19 0718   04/13/19 2200  ceFEPIme (MAXIPIME) 2 g in sodium chloride 0.9 % 100 mL IVPB  Status:  Discontinued     2 g 200 mL/hr over 30 Minutes Intravenous Every 8 hours 04/13/19 1428 04/14/19 1028   04/13/19 1430  vancomycin (VANCOCIN) IVPB 1000 mg/200 mL premix  Status:  Discontinued     1,000 mg 200 mL/hr over 60 Minutes Intravenous Every 12 hours 04/13/19 1428 04/14/19 1240   04/13/19 1415  ceFEPIme (MAXIPIME) 2 g in sodium chloride 0.9 % 100 mL IVPB     2 g 200 mL/hr over 30 Minutes Intravenous  Once 04/13/19 1411 04/13/19 1505   04/13/19 1415  metroNIDAZOLE (FLAGYL) IVPB 500 mg     500 mg 100 mL/hr over 60 Minutes Intravenous  Once 04/13/19 1411 04/13/19 1600   04/13/19 1415  vancomycin (VANCOCIN) IVPB 1000 mg/200 mL premix  Status:  Discontinued     1,000 mg 200 mL/hr over 60 Minutes Intravenous  Once 04/13/19 1411 04/13/19 1428      Subjective  The patient is resting comfortably. No new complaints.  Objective   Vitals:  Vitals:   04/25/19 1201 04/25/19 1235  BP:  (!) 149/86  Pulse:  94  Resp: (!) 21 19  Temp:  98.1 F (36.7 C)  SpO2: 100% 100%   Exam:  Constitutional:   The patient is awake, alert, and oriented x 3. No acute distress. Respiratory:   No increased work of breathing.  No wheezes, rales, or rhonchi  No tactile fremitus Cardiovascular:   Regular rate and rhythm  No murmurs, ectopy, or gallups.  No lateral PMI. No thrills. Abdomen:   Abdomen is soft, non-tender, non-distended  No hernias, masses, or organomegaly  Normoactive bowel sounds.  Musculoskeletal:   No cyanosis or clubbing  2-3+ pitting edema of lower extremities bilaterally  All 5 digits on right foot are blackened with dry gangrene. Skin:   No rashes, lesions, ulcers  palpation of skin: no induration or nodules  Blackening of distal aspect of 5 digits on right foot. Neurologic:   CN 2-12 intact  Sensation  all 4 extremities intact Psychiatric:   Mental status o Mood, affect appropriate o Orientation to person, place, time   judgment and insight appear intact  I have personally reviewed the following:   Today's Data   Vitals, CBC, BMP  Micro Data   No growth of surveillance cultures.  Scheduled Meds:  sodium chloride   Intravenous Once   atorvastatin  10 mg Oral q1800   Chlorhexidine Gluconate Cloth  6 each Topical Daily   clopidogrel  75 mg Oral Q breakfast   docusate sodium  100 mg Oral Daily   HYDROmorphone   Intravenous Q4H   insulin aspart  0-9 Units Subcutaneous TID WC   insulin glargine  15 Units Subcutaneous QHS   pantoprazole  40 mg Oral Daily   pneumococcal 23 valent vaccine  0.5 mL Intramuscular Tomorrow-1000   saccharomyces boulardii  250 mg Oral BID   senna-docusate  2 tablet Oral BID   sodium chloride flush  10-40 mL Intracatheter Q12H   sodium  chloride flush  3 mL Intravenous Once   sodium chloride flush  3 mL Intravenous Q12H   Continuous Infusions:  sodium chloride 250 mL (04/13/19 2351)   sodium chloride Stopped (04/13/19 2230)   sodium chloride     sodium chloride 100 mL/hr at 04/23/19 0951   ceFTAROline (TEFLARO) IV 600 mg (04/25/19 0500)   DAPTOmycin (CUBICIN)  IV 850 mg (04/24/19 2057)   lactated ringers 100 mL/hr at 04/21/19 0400   magnesium sulfate bolus IVPB      Principal Problem:   MRSA bacteremia Active Problems:   Diabetes mellitus type II, uncontrolled (HCC)   HTN (hypertension)   Osteomyelitis of spine (HCC)   Rectal mass   DVT of popliteal vein (HCC)   Gangrene of toe of right foot ([place])   Discitis of thoracolumbar region   Endocarditis of mitral valve   LOS: 12 days    A & P  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 Infectious disease has been consulted, recommendation for IV vancomycin for now.  Repeated blood cultures on 04/14/2019 - to date. Continue to follow cultures. MRI independently reviewed showed progressive T9 osteomyelitis with increased cultures evaluation and vertebral body collapse.  New discitis at T8-9 and T9-T10 with T8 and T10 osteomyelitis.  Moderate to severe spinal stenosis at T8-T9. Persistent extensive anterior paravertebral soft tissue inflammation/phlegmon. Decreased posterior paraspinal inflammation without residual or new abscess. Cardiology has been consulted for TEE to assess for endocarditis. This was performed on 04/18/2018 and demonstrated no vegetations or evidence of persistent endocarditis. Pain management in place. Continue bowel regimen.  Neurosurgery was reconsulted on 04/24/2019 on the advice of ID for re-evaluation of thoracic  spine discitis due to concern for persistent infection in this area. I appreciate neurosurgery's  assistance. Dr. Kathyrn Sheriff has continued to recommend medical management of the  patient's T8-T-10 osteomyelitis/diskitis. The patient is a poor surgical candidate. Surgical  approach will be withheld pending neurologic decline related to stenosis or subacute/chronic  development of significant kyphotic deformity requiring stabilization. TLSO brace has been  ordered.  MRSA bacteremia: Management as stated above. Currently afebrile with no leukocytosis. Continue to monitor WBC and fever curve. Surveillance cultures have had no growth.  T9 abscess status post aspiration by interventional radiology on 04/15/2019: Deep tissue culture growing staph aureus, continue to monitor cultures. Continue pain management and IV antibiotics. Repeat MRI has been ordered and neurosurgery has been re-consulted. I appreciate their assistance.  Recurrent thoracic vertebral osteomyelitis: Previously treated with 6 weeks of IV antibiotics with IV daptomycin on 04/04/2019. Significantly elevated CRP 19.4 and sed rate >140. Management as per above. Repeat MRI has been  ordered and neurosurgery has been re-consulted. I appreciate their assistance.  Peripheral artery disease: Right first through fifth toes dry gangrene: Vascular surgery following with possible intervention on Monday with Dr. Carlis Abbott. He wishes to wait to ensure clearance of bacteremia prior to procedure in case a stent is needed. The patient was quite upset upon hearing this. He believed that his toes were only "dry". The patient was taken to the vascular lab by Dr. Carlis Abbott today where he underwent bilateral arteriogram with bilateral runoff and stent placement in Rt EIA. The patient returned to vascular lab on 04/22/2019 for  right SFA to TP trunk bypass for popliteal occlusion in addition to iliac lesion. Strong pulse in the right lower extremity.  Tachycardia: Likely related to intraoperative volume loss. Will give  a bolus of NS.   History of DVT: On Eliquis which is on hold due to possible procedure, held since 04/14/2019.  Possible rectal mass: Monitor. May require GI evaluation when more stable and bacteremia has cleared.  Type 2 diabetes with hyperglycemia: Hemoglobin A1c 8.3 on 04/14/2019. Continue insulin coverage. FSBS 89-131 in the last 24 hours.  Essential hypertension: Continue to hold off antihypertensives for now due to soft blood pressure.  Hyperlipidemia: Continue Robaxin.  Diabetic polyneuropathy: Continue gabapentin  I have seen and examined this patient myself. I have spent 34 minutes in his evaluation and care.  DVT prophylaxis: SCDs. Code Status:Full Family Communication: None at bedside. Disposition Plan: Home when hemodynamically stable, vascular surgery and infectious disease have signed off.    Darion Juhasz, DO [facility] Direct contact: see [email or web address]  7PM-7AM contact night coverage as above 04/25/2019, 1:24 PM  LOS: 4 days

## 2019-04-26 DIAGNOSIS — Z95820 Peripheral vascular angioplasty status with implants and grafts: Secondary | ICD-10-CM

## 2019-04-26 DIAGNOSIS — M8448XA Pathological fracture, other site, initial encounter for fracture: Secondary | ICD-10-CM

## 2019-04-26 DIAGNOSIS — I058 Other rheumatic mitral valve diseases: Secondary | ICD-10-CM

## 2019-04-26 LAB — CBC WITH DIFFERENTIAL/PLATELET
Abs Immature Granulocytes: 0.05 10*3/uL (ref 0.00–0.07)
Basophils Absolute: 0 10*3/uL (ref 0.0–0.1)
Basophils Relative: 0 %
Eosinophils Absolute: 0.1 10*3/uL (ref 0.0–0.5)
Eosinophils Relative: 1 %
HCT: 23 % — ABNORMAL LOW (ref 39.0–52.0)
Hemoglobin: 7.1 g/dL — ABNORMAL LOW (ref 13.0–17.0)
Immature Granulocytes: 1 %
Lymphocytes Relative: 16 %
Lymphs Abs: 1.2 10*3/uL (ref 0.7–4.0)
MCH: 25.4 pg — ABNORMAL LOW (ref 26.0–34.0)
MCHC: 30.9 g/dL (ref 30.0–36.0)
MCV: 82.4 fL (ref 80.0–100.0)
Monocytes Absolute: 0.5 10*3/uL (ref 0.1–1.0)
Monocytes Relative: 7 %
Neutro Abs: 5.4 10*3/uL (ref 1.7–7.7)
Neutrophils Relative %: 75 %
Platelets: 435 10*3/uL — ABNORMAL HIGH (ref 150–400)
RBC: 2.79 MIL/uL — ABNORMAL LOW (ref 4.22–5.81)
RDW: 15.6 % — ABNORMAL HIGH (ref 11.5–15.5)
WBC: 7.3 10*3/uL (ref 4.0–10.5)
nRBC: 0 % (ref 0.0–0.2)

## 2019-04-26 LAB — BASIC METABOLIC PANEL
Anion gap: 8 (ref 5–15)
BUN: 8 mg/dL (ref 6–20)
CO2: 25 mmol/L (ref 22–32)
Calcium: 8.2 mg/dL — ABNORMAL LOW (ref 8.9–10.3)
Chloride: 101 mmol/L (ref 98–111)
Creatinine, Ser: 0.81 mg/dL (ref 0.61–1.24)
GFR calc Af Amer: >60 mL/min (ref >60)
GFR calc non Af Amer: >60 mL/min (ref >60)
Glucose, Bld: 206 mg/dL — ABNORMAL HIGH (ref 70–99)
Potassium: 3.8 mmol/L (ref 3.5–5.1)
Sodium: 134 mmol/L — ABNORMAL LOW (ref 135–145)

## 2019-04-26 LAB — GLUCOSE, CAPILLARY
Glucose-Capillary: 210 mg/dL — ABNORMAL HIGH (ref 70–99)
Glucose-Capillary: 248 mg/dL — ABNORMAL HIGH (ref 70–99)
Glucose-Capillary: 153 mg/dL — ABNORMAL HIGH (ref 70–99)
Glucose-Capillary: 202 mg/dL — ABNORMAL HIGH (ref 70–99)

## 2019-04-26 MED ORDER — OXYCODONE HCL 5 MG PO TABS
10.0000 mg | ORAL_TABLET | Freq: Every day | ORAL | Status: DC | PRN
  Administered 2019-04-29 – 2019-05-03 (×5): 10 mg via ORAL
  Filled 2019-04-26 (×2): qty 2

## 2019-04-26 NOTE — Progress Notes (Signed)
[facility]  Date of Admission:  04/13/2019     Total days of antibiotics 13 Day 5 of Ceftaroline and Daptomycin         ASSESSMENT:  Thomas [name] is currently on Day 5 of Ceftaroline/Daptomycin for persistent/recurrent disseminated MRSA infection with most recent MRI showing T8-T10 discitis/osteomyelitis and pathologic T9 compression fracture. Neurosurgery evaluated with recommendations for continued medical treatment given the stability of his image findings, intact neuro exam and that he is a poor surgical candidate reserving surgery for neurologic decline. Given his burden of infection and previous failure with daptomycin independently, will work on continuing daptomycin in combination with ceftaroline. I do have concern of antimicrobial therapy's ability to resolve this infection without other intervention. Disposition is challenging due to cost of medications. Home Health and ID Pharmacy working on obtaining assistance/authorizations. Unfortunately he is not currently working and does not have transportation to get to short stay/outpatient clinic daily.  May need to consider alternative therapies including Zyvox and long acting delbavancin. He will require at least 8 weeks of IV therapy.  Thomas [name] is POD 4 from right-tibial artery bypass grafting due to peripheral artery disease complicated with tissue loss/dry gangrene on right foot toes 1-5. Surgical sites are healing without complication.  PLAN:  1. Continue current dose of daptomycin and ceftaroline. 2. Wound care of right foot per Vascular Surgery 3. Pain management per primary team 4. Working on IT trainer.   Principal Problem:   MRSA bacteremia Active Problems:   Diabetes mellitus type II, uncontrolled (HCC)   HTN (hypertension)   Osteomyelitis of spine (HCC)   Rectal mass   DVT of popliteal vein (HCC)   Gangrene of toe of right foot (HCC)   Discitis of  thoracolumbar region   Endocarditis of mitral valve   . sodium chloride   Intravenous Once  . atorvastatin  10 mg Oral q1800  . Chlorhexidine Gluconate Cloth  6 each Topical Daily  . clopidogrel  75 mg Oral Q breakfast  . docusate sodium  100 mg Oral Daily  . HYDROmorphone   Intravenous Q4H  . insulin aspart  0-9 Units Subcutaneous TID WC  . insulin glargine  15 Units Subcutaneous QHS  . pantoprazole  40 mg Oral Daily  . pneumococcal 23 valent vaccine  0.5 mL Intramuscular Tomorrow-1000  . saccharomyces boulardii  250 mg Oral BID  . senna-docusate  2 tablet Oral BID  . sodium chloride flush  10-40 mL Intracatheter Q12H  . sodium chloride flush  3 mL Intravenous Once  . sodium chloride flush  3 mL Intravenous Q12H    SUBJECTIVE:  Afebrile overnight with no acute events. Continues to have pain in his right lower extremity and back. Able to walk with therapy today and now pain is worsened.   Medical translator virtually present to aid in communication as Thomas [name] preferred language is Arabic.   No Known Allergies   Review of Systems: Review of Systems  Constitutional: Negative for chills, fever and weight loss.  Respiratory: Negative for cough, shortness of breath and wheezing.   Cardiovascular: Negative for chest pain and leg swelling.  Gastrointestinal: Negative for abdominal pain, constipation, diarrhea, nausea and vomiting.  Musculoskeletal: Positive for back pain.       Positive for right lower extremity pain  Skin: Negative for rash.    OBJECTIVE: Vitals:   04/26/19 0512 04/26/19 0600 04/26/19 0749 04/26/19 0812  BP:  (!) 141/89 (!) 158/98 Marland Kitchen)  156/96  Pulse:  (!) 104    Resp: 14 13 19 16   Temp:  98.5 F (36.9 C) 98.5 F (36.9 C) 98.2 F (36.8 C)  TempSrc:  Oral Oral Oral  SpO2: 99% 100% 100% 100%  Weight:      Height:       Body mass index is 27.79 kg/m.  Physical Exam Constitutional:      General: He is not in acute distress.    Appearance: He  is well-developed.  Cardiovascular:     Rate and Rhythm: Normal rate and regular rhythm.     Heart sounds: Normal heart sounds.     Comments: Ischemic changes to right toes Pulmonary:     Effort: Pulmonary effort is normal.     Breath sounds: Normal breath sounds.  Skin:    General: Skin is warm and dry.  Neurological:     Mental Status: He is alert and oriented to person, place, and time.  Psychiatric:        Behavior: Behavior normal.        Thought Content: Thought content normal.        Judgment: Judgment normal.     Lab Results Lab Results  Component Value Date   WBC 7.3 04/26/2019   HGB 7.1 (L) 04/26/2019   HCT 23.0 (L) 04/26/2019   MCV 82.4 04/26/2019   PLT 435 (H) 04/26/2019    Lab Results  Component Value Date   CREATININE 0.81 04/26/2019   BUN 8 04/26/2019   NA 134 (L) 04/26/2019   K 3.8 04/26/2019   CL 101 04/26/2019   CO2 25 04/26/2019    Lab Results  Component Value Date   ALT 25 04/13/2019   AST 14 (L) 04/13/2019   ALKPHOS 84 04/13/2019   BILITOT 0.4 04/13/2019     Microbiology: Recent Results (from the past 240 hour(s))  Culture, blood (routine x 2)     Status: None   Collection Time: 04/19/19  3:06 PM   Specimen: BLOOD  Result Value Ref Range Status   Specimen Description BLOOD ASSIST CONTROL  Final   Special Requests   Final    BOTTLES DRAWN AEROBIC ONLY Blood Culture adequate volume   Culture   Final    NO GROWTH 5 DAYS Performed at [facility], [address], [place], [place]    Report Status 04/24/2019 FINAL  Final  Culture, blood (routine x 2)     Status: None   Collection Time: 04/19/19  3:06 PM   Specimen: BLOOD LEFT HAND  Result Value Ref Range Status   Specimen Description BLOOD LEFT HAND  Final   Special Requests   Final    BOTTLES DRAWN AEROBIC ONLY Blood Culture adequate volume   Culture   Final    NO GROWTH 5 DAYS Performed at [facility], [address], [place], [place]    Report  Status 04/24/2019 FINAL  Final  Surgical pcr screen     Status: None   Collection Time: 04/22/19  4:06 AM   Specimen: Nasal Mucosa; Nasal Swab  Result Value Ref Range Status   MRSA, PCR NEGATIVE NEGATIVE Final   Staphylococcus aureus NEGATIVE NEGATIVE Final    Comment: (NOTE) The Xpert SA Assay (FDA approved for NASAL specimens in patients 50 years of age and older), is one component of a comprehensive surveillance program. It is not intended to diagnose infection nor to guide or monitor treatment. Performed at [facility]  [facility], [place] [address] [place], [place] [address]      Terri Piedra, [place] [facility] [phone] Pager  04/26/2019  9:18 AM

## 2019-04-26 NOTE — Progress Notes (Signed)
13ml of Dilaudid wasted from PCA with Alison Stalling RN.  Versie Starks, RN

## 2019-04-26 NOTE — Progress Notes (Signed)
Occupational Therapy Treatment Patient Details Name: Thomas [name] MRN: [id number] DOB: 23-Mar-1976 Today's Date: 04/26/2019    History of present illness Patient is a 44 y/o male who presents with back pain, dizziness and fall at home. Recent long hospital stay at [facility] for disseminated MRSA bacteremia, osteomyelitis and discitis. Chest CT, abdomen and pelvis notable for findings of advanced osteomyelitis involving T9 as well as discitis T8-9, T9-10. s/p successful fluoro guided T9 vertebral body aspiration and biopsy 12/31. Positive blood cultures for MRSA. Also admitted for necrosis of right toes 1-5. s/p Right pop-TP trunk bypass with RSVG 04/22/19.   OT comments  Pt making steady progress towards OT goals this session. Session focus on LB AE for bathing and dressing. Pt reports 9/10 pain at start of session but agreeable to OT session. Issued pt LB AE for bathing and dressing and provided education on all AE with pt verbalizing understanding. Pt declined functional mobility d/t pain. Unclear how much support pt will have at home as pt reports he lives alone, but has support available. Will continue to follow acutely per POC and update DC recs as needed based on continued progress with therapies.    Follow Up Recommendations  Home health OT;Supervision/Assistance - 24 hour    Equipment Recommendations  Other (comment);3 in 1 bedside commode(LB AE)    Recommendations for Other Services      Precautions / Restrictions Precautions Precautions: Fall Precaution Comments: PCA discontinued, R thigh incisions (hematoma resolved), TLSO for comfort/support, declined donning Restrictions Weight Bearing Restrictions: No       Mobility Bed Mobility               General bed mobility comments: pt OOB in recliner  Transfers                 General transfer comment: unable to attempt d/t pain    Balance Overall balance assessment: Needs assistance;History of  Falls Sitting-balance support: Feet supported;Bilateral upper extremity supported Sitting balance-Leahy Scale: Good                                     ADL either performed or assessed with clinical judgement   ADL Overall ADL's : Needs assistance/impaired               Lower Body Bathing Details (indicate cue type and reason): education on using LH sponge for LB bathing as pt reports pain when reaching to feet     Lower Body Dressing: Moderate assistance;Sitting/lateral leans;With adaptive equipment Lower Body Dressing Details (indicate cue type and reason): pt complete LB dressing with MOD A with AE, education on all LB AE for dressing with pt verbalizing understanding   Toilet Transfer Details (indicate cue type and reason): pt up in recliner and declined simulated transfers d/t pain           General ADL Comments: pt with 9/10 pain, agreeable to LB ADLs from recliner but declined functional mobility. education provided on all LB AE for bathing/ dressing     Vision Patient Visual Report: No change from baseline     Perception     Praxis      Cognition Arousal/Alertness: Awake/alert Behavior During Therapy: WFL for tasks assessed/performed Overall Cognitive Status: Within Functional Limits for tasks assessed  General Comments: pt able to communicate with therapist without interpreter. overall, WFL for simple tasks. states he lives in [place] and has friends available. insistent that he will figure everything out at home        Exercises     Shoulder Instructions       General Comments issued pt LB AE for bathing and dressing. Pt continues to insist that he will figure everything out at home. Inconsistent reports on if he will have adequate family/ friend support at home    Pertinent Vitals/ Pain       Pain Assessment: 0-10 Pain Score: 9  Pain Location: back and RLE Pain Descriptors /  Indicators: Operative site guarding;Grimacing;Guarding;Sore;Constant;Tender Pain Intervention(s): Limited activity within patient's tolerance;Monitored during session;Repositioned  Home Living                                          Prior Functioning/Environment              Frequency  Min 2X/week        Progress Toward Goals  OT Goals(current goals can now be found in the care plan section)  Progress towards OT goals: Progressing toward goals  Acute Rehab OT Goals Patient Stated Goal: to walk more so he can go home OT Goal Formulation: With patient Time For Goal Achievement: 04/30/19 Potential to Achieve Goals: Good  Plan Discharge plan remains appropriate    Co-evaluation                 AM-PAC OT "6 Clicks" Daily Activity     Outcome Measure   Help from another person eating meals?: None Help from another person taking care of personal grooming?: None Help from another person toileting, which includes using toliet, bedpan, or urinal?: A Little Help from another person bathing (including washing, rinsing, drying)?: A Lot Help from another person to put on and taking off regular upper body clothing?: None Help from another person to put on and taking off regular lower body clothing?: Total 6 Click Score: 18    End of Session Equipment Utilized During Treatment: Other (comment)(LB AE)  OT Visit Diagnosis: Unsteadiness on feet (R26.81);Other abnormalities of gait and mobility (R26.89);Muscle weakness (generalized) (M62.81)   Activity Tolerance Patient tolerated treatment well   Patient Left in chair;with call bell/phone within reach;with chair alarm set   Nurse Communication Mobility status;Other (comment)(requesting pain meds)        Time: 7371-0626 OT Time Calculation (min): 15 min  Charges: OT General Charges $OT Visit: 1 Visit OT Treatments $Self Care/Home Management : 8-22 mins  Lanier Clam., COTA/L Acute Rehabilitation  Services [phone]    Ihor Gully 04/26/2019, 3:32 PM

## 2019-04-26 NOTE — Progress Notes (Addendum)
Progress Note    04/26/2019 7:55 AM 4 Days Post-Op  Subjective: Patient is interviewed and examined this morning using interpreter iPad.  He continues to complain of postoperative pain however, his back pain is his most significant complaint.  He is ambulated with assistance this morning.  He is tolerating his diet and having bowel movements.    Vitals:   04/26/19 0600 04/26/19 0749  BP: (!) 141/89 (!) 158/98  Pulse: (!) 104   Resp: 13 19  Temp: 98.5 F (36.9 C) 98.5 F (36.9 C)  SpO2: 100% 100%    Physical Exam: Cardiac: Heart rate and rhythm are regular Lungs: Unlabored Incisions: Lower extremity incisions are well approximated.  No erythema or drainage from incisions.  Mild lower extremity edema. Extremities: Soft.  Brisk Doppler signal of right posterior tibial, anterior tibial and peroneal.  Ischemic changes to toes 1 through 5 are stable without signs of cellulitis.  Left anterior tibial and posterior tibial Doppler signals.  CBC    Component Value Date/Time   WBC 7.3 04/26/2019 0424   RBC 2.79 (L) 04/26/2019 0424   HGB 7.1 (L) 04/26/2019 0424   HCT 23.0 (L) 04/26/2019 0424   PLT 435 (H) 04/26/2019 0424   MCV 82.4 04/26/2019 0424   MCH 25.4 (L) 04/26/2019 0424   MCHC 30.9 04/26/2019 0424   RDW 15.6 (H) 04/26/2019 0424   LYMPHSABS 1.2 04/26/2019 0424   MONOABS 0.5 04/26/2019 0424   EOSABS 0.1 04/26/2019 0424   BASOSABS 0.0 04/26/2019 0424    BMET    Component Value Date/Time   NA 134 (L) 04/26/2019 0424   K 3.8 04/26/2019 0424   CL 101 04/26/2019 0424   CO2 25 04/26/2019 0424   GLUCOSE 206 (H) 04/26/2019 0424   BUN 8 04/26/2019 0424   CREATININE 0.81 04/26/2019 0424   CREATININE 0.98 11/14/2016 1352   CALCIUM 8.2 (L) 04/26/2019 0424   GFRNONAA >60 04/26/2019 0424   GFRNONAA >89 11/14/2016 1352   GFRAA >60 04/26/2019 0424   GFRAA >89 11/14/2016 1352     Intake/Output Summary (Last 24 hours) at 04/26/2019 0755 Last data filed at 04/26/2019  0539 Gross per 24 hour  Intake --  Output 980 ml  Net -980 ml    HOSPITAL MEDICATIONS Scheduled Meds: . sodium chloride   Intravenous Once  . atorvastatin  10 mg Oral q1800  . Chlorhexidine Gluconate Cloth  6 each Topical Daily  . clopidogrel  75 mg Oral Q breakfast  . docusate sodium  100 mg Oral Daily  . HYDROmorphone   Intravenous Q4H  . insulin aspart  0-9 Units Subcutaneous TID WC  . insulin glargine  15 Units Subcutaneous QHS  . pantoprazole  40 mg Oral Daily  . pneumococcal 23 valent vaccine  0.5 mL Intramuscular Tomorrow-1000  . saccharomyces boulardii  250 mg Oral BID  . senna-docusate  2 tablet Oral BID  . sodium chloride flush  10-40 mL Intracatheter Q12H  . sodium chloride flush  3 mL Intravenous Once  . sodium chloride flush  3 mL Intravenous Q12H   Continuous Infusions: . sodium chloride 250 mL (04/13/19 2351)  . sodium chloride Stopped (04/13/19 2230)  . sodium chloride    . sodium chloride 100 mL/hr at 04/23/19 0951  . ceFTAROline (TEFLARO) IV 600 mg (04/26/19 0600)  . DAPTOmycin (CUBICIN)  IV 850 mg (04/25/19 2109)  . lactated ringers 100 mL/hr at 04/21/19 0400  . magnesium sulfate bolus IVPB     PRN Meds:.sodium chloride,  sodium chloride, sodium chloride, acetaminophen, acetaminophen, diphenhydrAMINE **OR** diphenhydrAMINE, guaiFENesin-dextromethorphan, hydrALAZINE, ibuprofen, labetalol, magnesium sulfate bolus IVPB, metoprolol tartrate, naloxone **AND** sodium chloride flush, ondansetron **OR** ondansetron (ZOFRAN) IV, oxyCODONE, phenol, potassium chloride, simethicone, sodium chloride flush, sodium chloride flush  Assessment:  44 y.o. male is s/p: PAD: Patient presented with ischemic changes to right toes 1 through 5.  He underwent arteriogram with right external iliac artery angioplasty and stent placement.  Plavix was initiated.  He subsequently underwent harvest of right great saphenous vein with right above-knee popliteal artery to tibioperoneal trunk  bypass with reversed ipsilateral great saphenous vein.  Patient has multiple medical issues including diabetes mellitus type 2, history of DVT, current treatment for MRSA bacteremia, discitis and possible rectal mass.  Multifactorial anemia currently with low but stable hemoglobin.  No evidence currently of postoperative hematoma.   4 Days Post-Op  Plan: -Continue Plavix and observe demarcation of right toes. -DVT prophylaxis:  SCDs   Risa Grill, PA-C [facility] [phone] 04/26/2019  7:55 AM   I have seen and evaluated the patient. I agree with the PA note as documented above. Doing well s/p R EIA stent and right AK pop to TP trunk bypass with vein.  Excellent PT signals.  Will continue to monitor toes.  Thigh looks much better.   Marty Heck, MD [facility] [place] Office: [phone]

## 2019-04-26 NOTE — Progress Notes (Signed)
PROGRESS NOTE  Thomas [name] XFG:[id number] DOB: Sep 30, 1975 DOA: 04/13/2019 PCP: [name], PA-C  Brief History   Thomas Mohammedis a 44 y.o.malewith medical history significant ofDM2 poorly controlled.  Recent hospitalization at [facility] regional for disseminated MRSA bacteremia, osteomyelitis and discitis.  Completed 6 weeks course of IV antibiotics on April 04, 2019.  Source of bacteremia thought to be from skin source (per previous facility report, area on toe that appeared cellulitic, area in R back that was erythematous and an abrasion of the lateral R aspect of his shin, abscess formation in the R calf and pyomyositis of the L vastus intermedius).  Developed necrosis of his R 1-5 digits since his discharge.    Presented here with worsening back pain, generalized weakness, fever and recent fall.  CT chest, abdomen and pelvis notable for findings of advanced osteomyelitis involving T9 as well as discitis.  Positive blood cultures for MRSA.  Seen by infectious disease, recommendations for IV Vancomycin.  Planned TEE on Monday.  Vascular surgery following for right 1st-5th toe dry gangrene. However, he wants to wait until current infection in controlled before proceeding with aortogram etc to address PAD and dry gangrene.  Pt underwent TEE on 04/19/2019 which did not demonstrate vegetations or other evidence of persistent endocarditis.   He was also taken to the vascular lab by Dr. Chestine Spore for Aortogram, bilateral lower extremity arteriogram with runoff, right external iliac artery angioplasty with stent placement under moderate conscious sedation. This was performed critical ischemia of the right lower extremity with gangrene of digits 1-5 of the right foot. Dr. Chestine Spore performed a right SFA to TP trunk bypass for popliteal occlusion in addition to iliac lesion on 04/22/2019. The patient apparently developed a thigh hematoma last night. He is receiving transfusion of 2 units PRBC's as his  hemoglobin of 5.3 this morning. ACE wrap has been placed on thigh.  Neurosurgery was reconsulted on 04/24/2019 on the advice of ID for re-evaluation of thoracic spine discitis due to concern for persistent infection in this area. I appreciate neurosurgery's assistance. Dr. [name] has continued to recommend medical management of the patient's T8-T-10 osteomyelitis/diskitis. The patient is a poor surgical candidate. Surgical approach will be withheld pending neurologic decline related to stenosis or subacute/chronic development of significant kyphotic deformity requiring stabilization. TLSO brace has been ordered.  Consultants  . Vascular surgery . Interventional radiology . Infectious disease . Wound Care . Neurosurgery  Procedures  . Aspiration of T9 to guide further therapy . Angiogram of bilateral lower extremities with stent placement in Rt EIA 04/19/2019.  Antibiotics   Anti-infectives (From admission, onward)   Start     Dose/Rate Route Frequency Ordered Stop   04/22/19 2000  DAPTOmycin (CUBICIN) 850 mg in sodium chloride 0.9 % IVPB     850 mg 234 mL/hr over 30 Minutes Intravenous Daily 04/22/19 1652     04/22/19 1700  ceftaroline (TEFLARO) 600 mg in sodium chloride 0.9 % 250 mL IVPB     600 mg 250 mL/hr over 60 Minutes Intravenous Every 8 hours 04/22/19 1652     04/22/19 1600  ceFAZolin (ANCEF) IVPB 2g/100 mL premix     2 g 200 mL/hr over 30 Minutes Intravenous Every 8 hours 04/22/19 1525 04/23/19 0217   04/22/19 0600  ceFAZolin (ANCEF) IVPB 2g/100 mL premix    Note to Pharmacy: Send with pt to OR   2 g 200 mL/hr over 30 Minutes Intravenous To Short Stay 04/21/19 0743 04/22/19 0813   04/18/19 1800  vancomycin (VANCOCIN) IVPB 1000 mg/200 mL premix  Status:  Discontinued     1,000 mg 200 mL/hr over 60 Minutes Intravenous Every 12 hours 04/18/19 0718 04/22/19 1652   04/14/19 1600  vancomycin (VANCOREADY) IVPB 1500 mg/300 mL  Status:  Discontinued     1,500 mg 150 mL/hr over 120  Minutes Intravenous Every 12 hours 04/14/19 1240 04/18/19 0718   04/13/19 2200  ceFEPIme (MAXIPIME) 2 g in sodium chloride 0.9 % 100 mL IVPB  Status:  Discontinued     2 g 200 mL/hr over 30 Minutes Intravenous Every 8 hours 04/13/19 1428 04/14/19 1028   04/13/19 1430  vancomycin (VANCOCIN) IVPB 1000 mg/200 mL premix  Status:  Discontinued     1,000 mg 200 mL/hr over 60 Minutes Intravenous Every 12 hours 04/13/19 1428 04/14/19 1240   04/13/19 1415  ceFEPIme (MAXIPIME) 2 g in sodium chloride 0.9 % 100 mL IVPB     2 g 200 mL/hr over 30 Minutes Intravenous  Once 04/13/19 1411 04/13/19 1505   04/13/19 1415  metroNIDAZOLE (FLAGYL) IVPB 500 mg     500 mg 100 mL/hr over 60 Minutes Intravenous  Once 04/13/19 1411 04/13/19 1600   04/13/19 1415  vancomycin (VANCOCIN) IVPB 1000 mg/200 mL premix  Status:  Discontinued     1,000 mg 200 mL/hr over 60 Minutes Intravenous  Once 04/13/19 1411 04/13/19 1428      Subjective  The patient is resting comfortably. No new complaints.  Objective   Vitals:  Vitals:   04/26/19 1029 04/26/19 1137  BP:  134/79  Pulse:    Resp:  15  Temp:  98.5 F (36.9 C)  SpO2: 98% 100%   Exam:  Constitutional:  . The patient is awake, alert, and oriented x 3. No acute distress. Respiratory:  . No increased work of breathing. . No wheezes, rales, or rhonchi . No tactile fremitus Cardiovascular:  . Regular rate and rhythm . No murmurs, ectopy, or gallups. . No lateral PMI. No thrills. Abdomen:  . Abdomen is soft, non-tender, non-distended . No hernias, masses, or organomegaly . Normoactive bowel sounds.  Musculoskeletal:  . No cyanosis or clubbing . 2-3+ pitting edema of lower extremities bilaterally . All 5 digits on right foot are blackened with dry gangrene. Skin:  . No rashes, lesions, ulcers . palpation of skin: no induration or nodules . Blackening of distal aspect of 5 digits on right foot. Neurologic:  . CN 2-12 intact . Sensation all 4  extremities intact Psychiatric:  . Mental status o Mood, affect appropriate o Orientation to person, place, time  . judgment and insight appear intact  I have personally reviewed the following:   Today's Data  . Vitals, CBC, BMP  Micro Data  . No growth of surveillance cultures.  Scheduled Meds: . sodium chloride   Intravenous Once  . atorvastatin  10 mg Oral q1800  . Chlorhexidine Gluconate Cloth  6 each Topical Daily  . clopidogrel  75 mg Oral Q breakfast  . docusate sodium  100 mg Oral Daily  . insulin aspart  0-9 Units Subcutaneous TID WC  . insulin glargine  15 Units Subcutaneous QHS  . pantoprazole  40 mg Oral Daily  . pneumococcal 23 valent vaccine  0.5 mL Intramuscular Tomorrow-1000  . saccharomyces boulardii  250 mg Oral BID  . senna-docusate  2 tablet Oral BID  . sodium chloride flush  10-40 mL Intracatheter Q12H  . sodium chloride flush  3 mL Intravenous Once  .  sodium chloride flush  3 mL Intravenous Q12H   Continuous Infusions: . sodium chloride 250 mL (04/13/19 2351)  . sodium chloride Stopped (04/13/19 2230)  . sodium chloride    . sodium chloride 100 mL/hr at 04/23/19 0951  . ceFTAROline (TEFLARO) IV 600 mg (04/26/19 0600)  . DAPTOmycin (CUBICIN)  IV 850 mg (04/25/19 2109)  . lactated ringers 100 mL/hr at 04/21/19 0400  . 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 Infectious disease has been consulted, recommendation for IV vancomycin for now. Repeated blood cultures on 04/14/2019 - to  date. Continue to follow cultures. MRI independently reviewed showed progressive T9 osteomyelitis with increased cultures evaluation and vertebral body collapse.  New discitis at T8-9 and T9-T10 with T8 and T10 osteomyelitis.  Moderate to severe spinal stenosis at T8-T9. Persistent extensive anterior paravertebral soft tissue inflammation/phlegmon. Decreased posterior paraspinal inflammation without residual or new abscess. Cardiology has been consulted for TEE to assess for endocarditis. This was performed on 04/18/2018 and demonstrated no vegetations or evidence of persistent endocarditis. Pain management in place. Continue bowel regimen.  Neurosurgery was reconsulted on 04/24/2019 on the advice of ID for re-evaluation of thoracic  spine discitis due to concern for persistent infection in this area. I appreciate neurosurgery's  assistance. Dr. [name] has continued to recommend medical management of the  patient's T8-T-10 osteomyelitis/diskitis. The patient is a poor surgical candidate. Surgical  approach will be withheld pending neurologic decline related to stenosis or subacute/chronic  development of significant kyphotic deformity requiring stabilization. TLSO brace has been  ordered.  MRSA bacteremia: Management as stated above. Currently afebrile with no leukocytosis. Continue to monitor WBC and fever curve. Surveillance cultures have had no growth.  T9 abscess status post aspiration by interventional radiology on 04/15/2019: Deep tissue culture growing staph aureus, continue to monitor cultures. Continue pain management and IV antibiotics. Repeat MRI has been ordered and neurosurgery has been re-consulted. I appreciate their assistance.  Recurrent thoracic vertebral osteomyelitis: Previously treated with 6 weeks of IV antibiotics with IV daptomycin on 04/04/2019. Significantly elevated CRP 19.4 and sed rate >140. Management as per above. Repeat MRI has been ordered and neurosurgery has been  re-consulted. I appreciate their assistance.  Peripheral artery disease: Right first through fifth toes dry gangrene: Vascular surgery following with possible intervention on Monday with Dr. Chestine Spore. He wishes to wait to ensure clearance of bacteremia prior to procedure in case a stent is needed. The patient was quite upset upon hearing this. He believed that his toes were only "dry". The patient was taken to the vascular lab by Dr. Chestine Spore today where he underwent bilateral arteriogram with bilateral runoff and stent placement in Rt EIA. The patient returned to vascular lab on 04/22/2019 for  right SFA to TP trunk bypass for popliteal occlusion in addition to iliac lesion. Strong pulse in the right lower extremity.  Tachycardia: Likely related to intraoperative volume loss. Will give a bolus of NS.   History of DVT:  On Eliquis which is on hold due to possible procedure, held since 04/14/2019.  Possible rectal mass: Monitor. May require GI evaluation when more stable and bacteremia has cleared.  Type 2 diabetes with hyperglycemia: Hemoglobin A1c 8.3 on 04/14/2019. Continue insulin coverage. FSBS 89-131 in the last 24 hours.  Essential hypertension: Continue to hold off antihypertensives for now due to soft blood pressure.  Hyperlipidemia: Continue Robaxin.  Diabetic polyneuropathy: Continue gabapentin  I have seen and examined this patient myself. I have spent 32 minutes in his evaluation and care.  DVT prophylaxis: SCDs. Code Status:Full Family Communication: None at bedside. Disposition Plan: Home when hemodynamically stable, vascular surgery and infectious disease have signed off.    Talen Poser, DO [facility] Direct contact: see [email or web address]  7PM-7AM contact night coverage as above 04/26/2019, 2:39 PM  LOS: 4 days

## 2019-04-26 NOTE — Progress Notes (Signed)
Physical Therapy Treatment Patient Details Name: Thomas [name] MRN: [id number] DOB: 12-Sep-1975 Today's Date: 04/26/2019    History of Present Illness Patient is a 44 y/o male who presents with back pain, dizziness and fall at home. Recent long hospital stay at [facility] for disseminated MRSA bacteremia, osteomyelitis and discitis. Chest CT, abdomen and pelvis notable for findings of advanced osteomyelitis involving T9 as well as discitis T8-9, T9-10. s/p successful fluoro guided T9 vertebral body aspiration and biopsy 12/31. Positive blood cultures for MRSA. Also admitted for necrosis of right toes 1-5. s/p Right pop-TP trunk bypass with RSVG 04/22/19.    PT Comments    Pt OOB in recliner upon arrival of PT, agreeable to PT session at this time. The pt continues to demonstrate improvements in pain management and subsequent improvements in independence, mobility, and function. The pt's course is complicated by prior independence as the pt lives home alone, otherwise the pt is currently functioning at a level where I would normally recommend d/c home with HHPT. The pt is able to transfer and ambulate safely with use of RW and supervision for safety for 350 ft with stable VS throughout. The pt will continue to benefit from skilled PT to maximize function and independence prior to d/c.      Follow Up Recommendations  Home health PT;Supervision - Intermittent     Equipment Recommendations  None recommended by PT    Recommendations for Other Services       Precautions / Restrictions Precautions Precautions: Fall Precaution Comments: PCA discontinued, R thigh incisions (hematoma resolved), TLSO for comfort/support Restrictions Weight Bearing Restrictions: No    Mobility  Bed Mobility Overal bed mobility: Needs Assistance             General bed mobility comments: pt OOB in recliner  Transfers Overall transfer level: Needs assistance Equipment used: Rolling walker (2  wheeled) Transfers: Sit to/from Stand Sit to Stand: Supervision         General transfer comment: increased time but no assist  Ambulation/Gait Ambulation/Gait assistance: Supervision Gait Distance (Feet): 350 Feet Assistive device: Rolling walker (2 wheeled) Gait Pattern/deviations: Step-through pattern;Decreased stride length Gait velocity: 0.37 m/s Gait velocity interpretation: 1.31 - 2.62 ft/sec, indicative of limited community ambulator General Gait Details: Pt with sig ER of LLE with ambulation, able to correct with cues but states it is more sore when in neutral rotation, supervision only for safety with use of RW   Stairs             Wheelchair Mobility    Modified Rankin (Stroke Patients Only)       Balance Overall balance assessment: Needs assistance;History of Falls Sitting-balance support: Feet supported;Bilateral upper extremity supported Sitting balance-Leahy Scale: Good Sitting balance - Comments: supervision   Standing balance support: During functional activity;Bilateral upper extremity supported Standing balance-Leahy Scale: Fair Standing balance comment: able to stand without RW, but relies on BUE support for amb                            Cognition Arousal/Alertness: Awake/alert Behavior During Therapy: WFL for tasks assessed/performed Overall Cognitive Status: Within Functional Limits for tasks assessed                                 General Comments: pt conversational about family and nephews without interpreter. Emphasized desire to walk so he can  go home      Exercises General Exercises - Lower Extremity Long Arc Quad: AROM;Both;10 reps;Seated Heel Raises: AROM;Both;20 reps;Seated    General Comments General comments (skin integrity, edema, etc.): incisions intact, pt declined use of translator      Pertinent Vitals/Pain Pain Assessment: Faces Faces Pain Scale: Hurts a little bit Pain Location:  RLE Pain Descriptors / Indicators: Operative site guarding;Grimacing;Guarding;Sore;Constant;Tender Pain Intervention(s): Limited activity within patient's tolerance;Monitored during session;Repositioned    Home Living                      Prior Function            PT Goals (current goals can now be found in the care plan section) Acute Rehab PT Goals Patient Stated Goal: to walk more so he can go home PT Goal Formulation: With patient Time For Goal Achievement: 04/30/19 Potential to Achieve Goals: Good Progress towards PT goals: Progressing toward goals    Frequency    Min 3X/week      PT Plan Current plan remains appropriate    Co-evaluation              AM-PAC PT "6 Clicks" Mobility   Outcome Measure  Help needed turning from your back to your side while in a flat bed without using bedrails?: A Little Help needed moving from lying on your back to sitting on the side of a flat bed without using bedrails?: A Little Help needed moving to and from a bed to a chair (including a wheelchair)?: A Little Help needed standing up from a chair using your arms (e.g., wheelchair or bedside chair)?: A Little Help needed to walk in hospital room?: A Little Help needed climbing 3-5 steps with a railing? : A Lot 6 Click Score: 17    End of Session Equipment Utilized During Treatment: Gait belt Activity Tolerance: Patient tolerated treatment well Patient left: in chair;with call bell/phone within reach Nurse Communication: Mobility status PT Visit Diagnosis: Pain;Muscle weakness (generalized) (M62.81);Unsteadiness on feet (R26.81) Pain - Right/Left: Right Pain - part of body: Leg     Time: 5093-2671 PT Time Calculation (min) (ACUTE ONLY): 38 min  Charges:  $Gait Training: 23-37 mins $Therapeutic Exercise: 8-22 mins                     Karma Ganja, PT, DPT   Acute Rehabilitation Department [phone]   Otho Bellows 04/26/2019, 10:39 AM

## 2019-04-27 LAB — GLUCOSE, CAPILLARY
Glucose-Capillary: 122 mg/dL — ABNORMAL HIGH (ref 70–99)
Glucose-Capillary: 146 mg/dL — ABNORMAL HIGH (ref 70–99)
Glucose-Capillary: 158 mg/dL — ABNORMAL HIGH (ref 70–99)

## 2019-04-27 LAB — CBC WITH DIFFERENTIAL/PLATELET
Abs Immature Granulocytes: 0.02 10*3/uL (ref 0.00–0.07)
Basophils Absolute: 0 10*3/uL (ref 0.0–0.1)
Basophils Relative: 0 %
Eosinophils Absolute: 0.1 10*3/uL (ref 0.0–0.5)
Eosinophils Relative: 3 %
HCT: 23 % — ABNORMAL LOW (ref 39.0–52.0)
Hemoglobin: 7.2 g/dL — ABNORMAL LOW (ref 13.0–17.0)
Immature Granulocytes: 0 %
Lymphocytes Relative: 27 %
Lymphs Abs: 1.5 10*3/uL (ref 0.7–4.0)
MCH: 25.4 pg — ABNORMAL LOW (ref 26.0–34.0)
MCHC: 31.3 g/dL (ref 30.0–36.0)
MCV: 81 fL (ref 80.0–100.0)
Monocytes Absolute: 0.5 10*3/uL (ref 0.1–1.0)
Monocytes Relative: 8 %
Neutro Abs: 3.5 10*3/uL (ref 1.7–7.7)
Neutrophils Relative %: 62 %
Platelets: 497 10*3/uL — ABNORMAL HIGH (ref 150–400)
RBC: 2.84 MIL/uL — ABNORMAL LOW (ref 4.22–5.81)
RDW: 15.5 % (ref 11.5–15.5)
WBC: 5.6 10*3/uL (ref 4.0–10.5)
nRBC: 0 % (ref 0.0–0.2)

## 2019-04-27 LAB — BASIC METABOLIC PANEL
Anion gap: 9 (ref 5–15)
BUN: 6 mg/dL (ref 6–20)
CO2: 25 mmol/L (ref 22–32)
Calcium: 8.4 mg/dL — ABNORMAL LOW (ref 8.9–10.3)
Chloride: 104 mmol/L (ref 98–111)
Creatinine, Ser: 0.75 mg/dL (ref 0.61–1.24)
GFR calc Af Amer: >60 mL/min (ref >60)
GFR calc non Af Amer: >60 mL/min (ref >60)
Glucose, Bld: 146 mg/dL — ABNORMAL HIGH (ref 70–99)
Potassium: 3.6 mmol/L (ref 3.5–5.1)
Sodium: 138 mmol/L (ref 135–145)

## 2019-04-27 LAB — MISC LABCORP TEST (SEND OUT)
LabCorp test name: 182261
Labcorp test code: 182261

## 2019-04-27 NOTE — Progress Notes (Signed)
Patient ID: Thomas [name], male   DOB: September 25, 1975, 44 y.o.   MRN: [id number]          [facility] Disease    Date of Admission:  04/13/2019   Total days of antibiotics 14        Day 6 ceftaroline and daptomycin  We have asked [facility] to do a financial assessment to see what outpatient IV antibiotic options are available for Thomas [name].        Cliffton Asters, MD [facility] for Infectious Disease [facility] [phone] pager   [phone] cell 04/27/2019, 1:57 PM

## 2019-04-27 NOTE — Progress Notes (Signed)
Physical Therapy Treatment Patient Details Name: Thomas [name] MRN: [id number] DOB: [birth year] Today's Date: 04/27/2019    History of Present Illness Patient is a 44 y/o male who presents with back pain, dizziness and fall at home. Recent long hospital stay at [facility] regional for disseminated MRSA bacteremia, osteomyelitis and discitis. Chest CT, abdomen and pelvis notable for findings of advanced osteomyelitis involving T9 as well as discitis T8-9, T9-10. s/p successful fluoro guided T9 vertebral body aspiration and biopsy 12/31. Positive blood cultures for MRSA. Also admitted for necrosis of right toes 1-5. s/p Right pop-TP trunk bypass with RSVG 04/22/19.    PT Comments    Pt in bed upon PT arrival, agreeable to PT session at this time. Pt continues to present with limitations in mobility, endurance, stability, and independence due to above dx and subsequent pain. The pt was unable to progress mobility this session due to sig increase in pain in his back with ambulation. The pt was able to participate in BLE strengthening exercises and some exercises for postural muscles. However, the pt will continue to benefit from skilled PT to improve mobility and balance given his prior level of independence and lack of support at home.    Follow Up Recommendations  Home health PT;Supervision - Intermittent     Equipment Recommendations  None recommended by PT    Recommendations for Other Services       Precautions / Restrictions Precautions Precautions: Fall Precaution Comments: PCA discontinued, R thigh incisions (hematoma resolved), TLSO for comfort/support, declined donning Restrictions Weight Bearing Restrictions: No    Mobility  Bed Mobility Overal bed mobility: Needs Assistance Bed Mobility: Supine to Sit Rolling: Supervision Sidelying to sit: HOB elevated;Supervision       General bed mobility comments: pt able to come to EOB without assist, some extra  time  Transfers Overall transfer level: Needs assistance Equipment used: Rolling walker (2 wheeled) Transfers: Sit to/from Stand Sit to Stand: Supervision         General transfer comment: pt able to some to standing but has increase in pain, uses trunk flexion to improve pain  Ambulation/Gait Ambulation/Gait assistance: Supervision Gait Distance (Feet): 100 Feet Assistive device: Rolling walker (2 wheeled) Gait Pattern/deviations: Step-through pattern;Decreased stride length;Trunk flexed Gait velocity: 0.37 m/s Gait velocity interpretation: 1.31 - 2.62 ft/sec, indicative of limited community ambulator General Gait Details: Pt with sig ER of LLE with ambulation, able to correct with cues but states it is more sore when in neutral rotation, supervision only for safety with use of RW, decreased ambulation distance due to pain in back   Stairs             Wheelchair Mobility    Modified Rankin (Stroke Patients Only)       Balance Overall balance assessment: Needs assistance;History of Falls Sitting-balance support: Feet supported;Bilateral upper extremity supported Sitting balance-Leahy Scale: Good Sitting balance - Comments: supervision   Standing balance support: During functional activity;Bilateral upper extremity supported Standing balance-Leahy Scale: Fair Standing balance comment: able to stand without RW, but relies on BUE support for amb                            Cognition Arousal/Alertness: Awake/alert Behavior During Therapy: [facility] for tasks assessed/performed Overall Cognitive Status: Within Functional Limits for tasks assessed  General Comments: pt able to communicate with therapist without interpreter. overall, WFL for simple tasks. states he lives in [place] and has friends available. insistent that he will figure everything out at home      Exercises General Exercises - Lower  Extremity Long Arc Quad: AROM;Both;10 reps;Seated Hip Flexion/Marching: AROM;Both;5 reps;Seated Toe Raises: AROM;Both;10 reps;Seated Heel Raises: AROM;Both;20 reps;Seated Other Exercises Other Exercises: Scapular retractions; 2 x 5 seated    General Comments        Pertinent Vitals/Pain Pain Assessment: Faces Faces Pain Scale: Hurts whole lot Pain Location: back and RLE Pain Descriptors / Indicators: Operative site guarding;Grimacing;Guarding;Sore;Constant;Tender Pain Intervention(s): Limited activity within patient's tolerance;Monitored during session;Repositioned;Patient requesting pain meds-RN notified    Home Living                      Prior Function            PT Goals (current goals can now be found in the care plan section) Acute Rehab PT Goals Patient Stated Goal: to walk more so he can go home PT Goal Formulation: With patient Time For Goal Achievement: 04/30/19 Potential to Achieve Goals: Good Progress towards PT goals: Progressing toward goals    Frequency    Min 3X/week      PT Plan Current plan remains appropriate    Co-evaluation              AM-PAC PT "6 Clicks" Mobility   Outcome Measure  Help needed turning from your back to your side while in a flat bed without using bedrails?: A Little Help needed moving from lying on your back to sitting on the side of a flat bed without using bedrails?: A Little Help needed moving to and from a bed to a chair (including a wheelchair)?: A Little Help needed standing up from a chair using your arms (e.g., wheelchair or bedside chair)?: A Little Help needed to walk in hospital room?: A Little Help needed climbing 3-5 steps with a railing? : A Lot 6 Click Score: 17    End of Session Equipment Utilized During Treatment: Gait belt Activity Tolerance: Patient tolerated treatment well;Patient limited by pain Patient left: in chair;with call bell/phone within reach Nurse Communication: Mobility  status;Patient requests pain meds PT Visit Diagnosis: Pain;Muscle weakness (generalized) (M62.81);Unsteadiness on feet (R26.81) Pain - Right/Left: Right Pain - part of body: Leg(and back)     Time: 1445-1501 PT Time Calculation (min) (ACUTE ONLY): 16 min  Charges:  $Gait Training: 8-22 mins                     Karma Ganja, PT, DPT   Acute Rehabilitation Department Pager #: [phone]   Otho Bellows 04/27/2019, 3:12 PM

## 2019-04-27 NOTE — Plan of Care (Signed)
  Problem: Clinical Measurements: Goal: Will remain free from infection Outcome: Progressing Goal: Respiratory complications will improve Outcome: Progressing Goal: Cardiovascular complication will be avoided Outcome: Progressing   

## 2019-04-27 NOTE — Progress Notes (Signed)
PROGRESS NOTE  Kerman Pfost XFG:[id number] DOB: Sep 30, 1975 DOA: 04/13/2019 PCP: [name], PA-C  Brief History   Thomas Mohammedis a 44 y.o.malewith medical history significant ofDM2 poorly controlled.  Recent hospitalization at [facility] regional for disseminated MRSA bacteremia, osteomyelitis and discitis.  Completed 6 weeks course of IV antibiotics on April 04, 2019.  Source of bacteremia thought to be from skin source (per previous facility report, area on toe that appeared cellulitic, area in R back that was erythematous and an abrasion of the lateral R aspect of his shin, abscess formation in the R calf and pyomyositis of the L vastus intermedius).  Developed necrosis of his R 1-5 digits since his discharge.    Presented here with worsening back pain, generalized weakness, fever and recent fall.  CT chest, abdomen and pelvis notable for findings of advanced osteomyelitis involving T9 as well as discitis.  Positive blood cultures for MRSA.  Seen by infectious disease, recommendations for IV Vancomycin.  Planned TEE on Monday.  Vascular surgery following for right 1st-5th toe dry gangrene. However, he wants to wait until current infection in controlled before proceeding with aortogram etc to address PAD and dry gangrene.  Pt underwent TEE on 04/19/2019 which did not demonstrate vegetations or other evidence of persistent endocarditis.   He was also taken to the vascular lab by Dr. Chestine Spore for Aortogram, bilateral lower extremity arteriogram with runoff, right external iliac artery angioplasty with stent placement under moderate conscious sedation. This was performed critical ischemia of the right lower extremity with gangrene of digits 1-5 of the right foot. Dr. Chestine Spore performed a right SFA to TP trunk bypass for popliteal occlusion in addition to iliac lesion on 04/22/2019. The patient apparently developed a thigh hematoma last night. He is receiving transfusion of 2 units PRBC's as his  hemoglobin of 5.3 this morning. ACE wrap has been placed on thigh.  Neurosurgery was reconsulted on 04/24/2019 on the advice of ID for re-evaluation of thoracic spine discitis due to concern for persistent infection in this area. I appreciate neurosurgery's assistance. Dr. [name] has continued to recommend medical management of the patient's T8-T-10 osteomyelitis/diskitis. The patient is a poor surgical candidate. Surgical approach will be withheld pending neurologic decline related to stenosis or subacute/chronic development of significant kyphotic deformity requiring stabilization. TLSO brace has been ordered.  Consultants  . Vascular surgery . Interventional radiology . Infectious disease . Wound Care . Neurosurgery  Procedures  . Aspiration of T9 to guide further therapy . Angiogram of bilateral lower extremities with stent placement in Rt EIA 04/19/2019.  Antibiotics   Anti-infectives (From admission, onward)   Start     Dose/Rate Route Frequency Ordered Stop   04/22/19 2000  DAPTOmycin (CUBICIN) 850 mg in sodium chloride 0.9 % IVPB     850 mg 234 mL/hr over 30 Minutes Intravenous Daily 04/22/19 1652     04/22/19 1700  ceftaroline (TEFLARO) 600 mg in sodium chloride 0.9 % 250 mL IVPB     600 mg 250 mL/hr over 60 Minutes Intravenous Every 8 hours 04/22/19 1652     04/22/19 1600  ceFAZolin (ANCEF) IVPB 2g/100 mL premix     2 g 200 mL/hr over 30 Minutes Intravenous Every 8 hours 04/22/19 1525 04/23/19 0217   04/22/19 0600  ceFAZolin (ANCEF) IVPB 2g/100 mL premix    Note to Pharmacy: Send with pt to OR   2 g 200 mL/hr over 30 Minutes Intravenous To Short Stay 04/21/19 0743 04/22/19 0813   04/18/19 1800  vancomycin (VANCOCIN) IVPB 1000 mg/200 mL premix  Status:  Discontinued     1,000 mg 200 mL/hr over 60 Minutes Intravenous Every 12 hours 04/18/19 0718 04/22/19 1652   04/14/19 1600  vancomycin (VANCOREADY) IVPB 1500 mg/300 mL  Status:  Discontinued     1,500 mg 150 mL/hr over 120  Minutes Intravenous Every 12 hours 04/14/19 1240 04/18/19 0718   04/13/19 2200  ceFEPIme (MAXIPIME) 2 g in sodium chloride 0.9 % 100 mL IVPB  Status:  Discontinued     2 g 200 mL/hr over 30 Minutes Intravenous Every 8 hours 04/13/19 1428 04/14/19 1028   04/13/19 1430  vancomycin (VANCOCIN) IVPB 1000 mg/200 mL premix  Status:  Discontinued     1,000 mg 200 mL/hr over 60 Minutes Intravenous Every 12 hours 04/13/19 1428 04/14/19 1240   04/13/19 1415  ceFEPIme (MAXIPIME) 2 g in sodium chloride 0.9 % 100 mL IVPB     2 g 200 mL/hr over 30 Minutes Intravenous  Once 04/13/19 1411 04/13/19 1505   04/13/19 1415  metroNIDAZOLE (FLAGYL) IVPB 500 mg     500 mg 100 mL/hr over 60 Minutes Intravenous  Once 04/13/19 1411 04/13/19 1600   04/13/19 1415  vancomycin (VANCOCIN) IVPB 1000 mg/200 mL premix  Status:  Discontinued     1,000 mg 200 mL/hr over 60 Minutes Intravenous  Once 04/13/19 1411 04/13/19 1428      Subjective  The patient is resting comfortably. No new complaints.  Objective   Vitals:  Vitals:   04/27/19 0854 04/27/19 1214  BP: (!) 151/80 (!) 149/90  Pulse: (!) 102 98  Resp: 19 18  Temp: 98.1 F (36.7 C) 97.8 F (36.6 C)  SpO2: 99% 100%   Exam:  Constitutional:  . The patient is awake, alert, and oriented x 3. No acute distress. Respiratory:  . No increased work of breathing. . No wheezes, rales, or rhonchi . No tactile fremitus Cardiovascular:  . Regular rate and rhythm . No murmurs, ectopy, or gallups. . No lateral PMI. No thrills. Abdomen:  . Abdomen is soft, non-tender, non-distended . No hernias, masses, or organomegaly . Normoactive bowel sounds.  Musculoskeletal:  . No cyanosis or clubbing . 2-3+ pitting edema of lower extremities bilaterally . All 5 digits on right foot are blackened with dry gangrene. Skin:  . No rashes, lesions, ulcers . palpation of skin: no induration or nodules . Blackening of distal aspect of 5 digits on right foot. Neurologic:   . CN 2-12 intact . Sensation all 4 extremities intact Psychiatric:  . Mental status o Mood, affect appropriate o Orientation to person, place, time  . judgment and insight appear intact  I have personally reviewed the following:   Today's Data  . Vitals, CBC, BMP  Micro Data  . No growth of surveillance cultures.  Scheduled Meds: . sodium chloride   Intravenous Once  . atorvastatin  10 mg Oral q1800  . Chlorhexidine Gluconate Cloth  6 each Topical Daily  . clopidogrel  75 mg Oral Q breakfast  . docusate sodium  100 mg Oral Daily  . insulin aspart  0-9 Units Subcutaneous TID WC  . insulin glargine  15 Units Subcutaneous QHS  . pantoprazole  40 mg Oral Daily  . pneumococcal 23 valent vaccine  0.5 mL Intramuscular Tomorrow-1000  . saccharomyces boulardii  250 mg Oral BID  . senna-docusate  2 tablet Oral BID  . sodium chloride flush  10-40 mL Intracatheter Q12H  . sodium chloride flush  3 mL Intravenous Once  . sodium chloride flush  3 mL Intravenous Q12H   Continuous Infusions: . sodium chloride 250 mL (04/13/19 2351)  . sodium chloride Stopped (04/13/19 2230)  . sodium chloride    . sodium chloride 100 mL/hr at 04/23/19 0951  . ceFTAROline (TEFLARO) IV 600 mg (04/27/19 0630)  . DAPTOmycin (CUBICIN)  IV 850 mg (04/26/19 2104)  . lactated ringers 100 mL/hr at 04/21/19 0400  . 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 Infectious disease has been consulted, recommendation for IV vancomycin for now.  Repeated blood cultures on 04/14/2019 - to date. Continue to follow cultures. MRI independently reviewed showed progressive T9 osteomyelitis with increased cultures evaluation and vertebral body collapse.  New discitis at T8-9 and T9-T10 with T8 and T10 osteomyelitis.  Moderate to severe spinal stenosis at T8-T9. Persistent extensive anterior paravertebral soft tissue inflammation/phlegmon. Decreased posterior paraspinal inflammation without residual or new abscess. Cardiology has been consulted for TEE to assess for endocarditis. This was performed on 04/18/2018 and demonstrated no vegetations or evidence of persistent endocarditis. Pain management in place. Continue bowel regimen.  Neurosurgery was reconsulted on 04/24/2019 on the advice of ID for re-evaluation of thoracic  spine discitis due to concern for persistent infection in this area. I appreciate neurosurgery's  assistance. Dr. [name] has continued to recommend medical management of the  patient's T8-T-10 osteomyelitis/diskitis. The patient is a poor surgical candidate. Surgical  approach will be withheld pending neurologic decline related to stenosis or subacute/chronic  development of significant kyphotic deformity requiring stabilization. TLSO brace has been  Ordered. Per ID the patient will need to follow this 2 drug regimen of IV Cetaroline and Daptomycin for a total of 8 weeks. Today is day 6. Last day of therapy will be 06/16/2019. CM is looking into the financial aspects of the patient being able to complete this therapy.  MRSA bacteremia: Management as stated above. Currently afebrile with no leukocytosis. Continue to monitor WBC and fever curve. Surveillance cultures have had no growth.  T9 abscess status post aspiration by interventional radiology on 04/15/2019: Deep tissue culture growing staph aureus, continue to monitor cultures. Continue pain management and IV antibiotics. Repeat MRI has been ordered and neurosurgery has been  re-consulted. I appreciate their assistance.  Recurrent thoracic vertebral osteomyelitis: Previously treated with 6 weeks of IV antibiotics with IV daptomycin on 04/04/2019. Significantly elevated CRP 19.4 and sed rate >140. Management as per above. Repeat MRI has been ordered and neurosurgery has been re-consulted. I appreciate their assistance.  Peripheral artery disease: Right first through fifth toes dry gangrene: Vascular surgery following with possible intervention on Monday with Dr. Chestine Spore. He wishes to wait to ensure clearance of bacteremia prior to procedure in case a stent is needed. The patient was quite upset upon hearing this. He believed that his toes were only "dry". The patient was taken to the vascular lab by Dr. Chestine Spore today where he underwent bilateral arteriogram with bilateral runoff  and stent placement in Rt EIA. The patient returned to vascular lab on 04/22/2019 for  right SFA to TP trunk bypass for popliteal occlusion in addition to iliac lesion. Strong pulse in the right lower extremity.  Tachycardia: Likely related to intraoperative volume loss. Improved with a bolus.  History of DVT: On Eliquis which is on hold due to possible procedure, held since 04/14/2019.  Possible rectal mass: Monitor. Will need GI evaluation when more stable and bacteremia has cleared.  Type 2 diabetes with hyperglycemia: Hemoglobin A1c 8.3 on 04/14/2019. Continue insulin coverage. FSBS 146 - 202 in the last 24 hours.  Essential hypertension: Continue to hold off antihypertensives for now due to soft blood pressure.  Hyperlipidemia: Continue Robaxin.  Diabetic polyneuropathy: Continue gabapentin  I have seen and examined this patient myself. I have spent 34 minutes in his evaluation and care.  DVT prophylaxis: SCDs. Code Status:Full Family Communication: None at bedside. Disposition Plan: Home when hemodynamically stable, vascular surgery and infectious disease have signed off.  The  patient will need to follow this 2 drug regimen of IV Cetaroline and Daptomycin for a total of 8 weeks. Today is day 6. Last day of therapy will be 06/16/2019.  Elaine Roanhorse, DO [facility] Direct contact: see [email or web address]  7PM-7AM contact night coverage as above 04/26/2019, 2:39 PM  LOS: 4 days

## 2019-04-28 ENCOUNTER — Encounter (HOSPITAL_COMMUNITY): Payer: No Typology Code available for payment source

## 2019-04-28 LAB — GLUCOSE, CAPILLARY
Glucose-Capillary: 149 mg/dL — ABNORMAL HIGH (ref 70–99)
Glucose-Capillary: 113 mg/dL — ABNORMAL HIGH (ref 70–99)
Glucose-Capillary: 150 mg/dL — ABNORMAL HIGH (ref 70–99)
Glucose-Capillary: 141 mg/dL — ABNORMAL HIGH (ref 70–99)
Glucose-Capillary: 168 mg/dL — ABNORMAL HIGH (ref 70–99)

## 2019-04-28 LAB — CBC WITH DIFFERENTIAL/PLATELET
Abs Immature Granulocytes: 0.03 10*3/uL (ref 0.00–0.07)
Basophils Absolute: 0 10*3/uL (ref 0.0–0.1)
Basophils Relative: 1 %
Eosinophils Absolute: 0.1 10*3/uL (ref 0.0–0.5)
Eosinophils Relative: 2 %
HCT: 23.7 % — ABNORMAL LOW (ref 39.0–52.0)
Hemoglobin: 7.2 g/dL — ABNORMAL LOW (ref 13.0–17.0)
Immature Granulocytes: 1 %
Lymphocytes Relative: 25 %
Lymphs Abs: 1.4 10*3/uL (ref 0.7–4.0)
MCH: 24.7 pg — ABNORMAL LOW (ref 26.0–34.0)
MCHC: 30.4 g/dL (ref 30.0–36.0)
MCV: 81.2 fL (ref 80.0–100.0)
Monocytes Absolute: 0.4 10*3/uL (ref 0.1–1.0)
Monocytes Relative: 8 %
Neutro Abs: 3.6 10*3/uL (ref 1.7–7.7)
Neutrophils Relative %: 63 %
Platelets: 571 10*3/uL — ABNORMAL HIGH (ref 150–400)
RBC: 2.92 MIL/uL — ABNORMAL LOW (ref 4.22–5.81)
RDW: 15.2 % (ref 11.5–15.5)
WBC: 5.6 10*3/uL (ref 4.0–10.5)
nRBC: 0 % (ref 0.0–0.2)

## 2019-04-28 LAB — MINIMUM INHIBITORY CONC. (1 DRUG)

## 2019-04-28 LAB — MIC RESULT

## 2019-04-28 MED ORDER — TRAMADOL HCL 50 MG PO TABS
50.0000 mg | ORAL_TABLET | Freq: Four times a day (QID) | ORAL | Status: DC | PRN
  Administered 2019-04-28 – 2019-04-30 (×6): 50 mg via ORAL
  Filled 2019-04-28 (×6): qty 1

## 2019-04-28 NOTE — Progress Notes (Signed)
Progress Note    04/28/2019 7:23 AM 6 Days Post-Op  Subjective: The patient's primary complaint this morning is back pain and inability to sleep.  PT notes reviewed.  He is ambulating.   Vitals:   04/27/19 2345 04/28/19 0622  BP: (!) 154/86 (!) 159/98  Pulse: 90 88  Resp: 18 20  Temp: 98.1 F (36.7 C) 97.8 F (36.6 C)  SpO2: 100% 100%    Physical Exam: Cardiac: Heart rate and rhythm are regular Lungs: Nonlabored Incisions: All right lower extremity incisions are well approximated and healing without signs of infection. Extremities: Right thigh and calf are soft to palpation.  Ischemic changes to toes are stable, dry.  Biphasic right peroneal and posterior tibial Doppler signals.  The dorsalis pedis signal is monophasic.  Examination of the left lower extremity reveals no skin changes with dopplerable dorsalis pedis and peroneal signals.   CBC    Component Value Date/Time   WBC 5.6 04/27/2019 0500   RBC 2.84 (L) 04/27/2019 0500   HGB 7.2 (L) 04/27/2019 0500   HCT 23.0 (L) 04/27/2019 0500   PLT 497 (H) 04/27/2019 0500   MCV 81.0 04/27/2019 0500   MCH 25.4 (L) 04/27/2019 0500   MCHC 31.3 04/27/2019 0500   RDW 15.5 04/27/2019 0500   LYMPHSABS 1.5 04/27/2019 0500   MONOABS 0.5 04/27/2019 0500   EOSABS 0.1 04/27/2019 0500   BASOSABS 0.0 04/27/2019 0500    BMET    Component Value Date/Time   NA 138 04/27/2019 0500   K 3.6 04/27/2019 0500   CL 104 04/27/2019 0500   CO2 25 04/27/2019 0500   GLUCOSE 146 (H) 04/27/2019 0500   BUN 6 04/27/2019 0500   CREATININE 0.75 04/27/2019 0500   CREATININE 0.98 11/14/2016 1352   CALCIUM 8.4 (L) 04/27/2019 0500   GFRNONAA >60 04/27/2019 0500   GFRNONAA >89 11/14/2016 1352   GFRAA >60 04/27/2019 0500   GFRAA >89 11/14/2016 1352     Intake/Output Summary (Last 24 hours) at 04/28/2019 0723 Last data filed at 04/27/2019 1700 Gross per 24 hour  Intake 240 ml  Output 1150 ml  Net -910 ml    HOSPITAL MEDICATIONS Scheduled  Meds: . sodium chloride   Intravenous Once  . atorvastatin  10 mg Oral q1800  . Chlorhexidine Gluconate Cloth  6 each Topical Daily  . clopidogrel  75 mg Oral Q breakfast  . docusate sodium  100 mg Oral Daily  . insulin aspart  0-9 Units Subcutaneous TID WC  . insulin glargine  15 Units Subcutaneous QHS  . pantoprazole  40 mg Oral Daily  . pneumococcal 23 valent vaccine  0.5 mL Intramuscular Tomorrow-1000  . saccharomyces boulardii  250 mg Oral BID  . senna-docusate  2 tablet Oral BID  . sodium chloride flush  10-40 mL Intracatheter Q12H  . sodium chloride flush  3 mL Intravenous Once  . sodium chloride flush  3 mL Intravenous Q12H   Continuous Infusions: . sodium chloride 250 mL (04/13/19 2351)  . sodium chloride Stopped (04/13/19 2230)  . sodium chloride    . sodium chloride 100 mL/hr at 04/23/19 0951  . ceFTAROline (TEFLARO) IV 600 mg (04/28/19 0557)  . DAPTOmycin (CUBICIN)  IV 850 mg (04/27/19 2337)  . lactated ringers 100 mL/hr at 04/21/19 0400  . magnesium sulfate bolus IVPB     PRN Meds:.sodium chloride, sodium chloride, sodium chloride, acetaminophen, acetaminophen, guaiFENesin-dextromethorphan, hydrALAZINE, ibuprofen, labetalol, magnesium sulfate bolus IVPB, metoprolol tartrate, ondansetron **OR** ondansetron (ZOFRAN) IV, oxyCODONE, oxyCODONE,  phenol, potassium chloride, simethicone, sodium chloride flush, sodium chloride flush  Assessment:  44 y.o. male is s/p: R EIA stent and right AK pop to TP trunk bypass with vein. Hemoglobin low, but stable  6 Days Post-Op  Plan: -Continue Plavix.  Continue observation of right toes    Wendi Maya, PA-C [facility] [phone] 04/28/2019  7:23 AM

## 2019-04-28 NOTE — Progress Notes (Signed)
PROGRESS NOTE    Thomas [name]  POE:[id number] DOB: [birth year] DOA: 04/13/2019 PCP: [name], PA-C     Brief Narrative:  Thomas [name] is a 44 y.o.malewith medical history significant ofDM2 poorly controlled.Recent hospitalization at [facility] for disseminated MRSA bacteremia, osteomyelitis and discitis. Completed 6 weeks course of IV antibiotics on April 04, 2019. Source of bacteremia thought to be from skin source (per previous facility report, area on toe that appeared cellulitic, area in R back that was erythematous and an abrasion of the lateral R aspect of his shin, abscess formation in the R calf and pyomyositis of the L vastus intermedius).Developed necrosis of his R 1-5 digits sincehisdischarge.  Presentedherewith worsening back pain, generalized weakness, fever and recent fall. CT chest, abdomen and pelvis notable for findings of advanced osteomyelitis involving T9 as well as discitis.Positive blood cultures for MRSA. Seen by infectious disease,recommendations for IV Vancomycin. Pt underwent TEE on 04/19/2019 which did not demonstrate vegetations or other evidence of persistent endocarditis.   He was also taken to the vascular lab by Dr. Chestine Spore for Aortogram, bilateral lower extremity arteriogram with runoff, right external iliac artery angioplasty with stent placement under moderate conscious sedation. This was performed for critical ischemia of the right lower extremity with gangrene of digits 1-5 of the right foot. Dr. Chestine Spore performed a right SFA to TP trunk bypass for popliteal occlusion in addition to iliac lesion on 04/22/2019. The patient apparently developed a thigh hematoma and received transfusion of 2 units PRBC.  Neurosurgery was reconsulted on 04/24/2019 for re-evaluation of thoracic spine discitis due to concern for persistent infection in this area. I appreciate neurosurgery's assistance. Dr. [name] has continued to recommend medical  management of the patient's T8-T-10 osteomyelitis/diskitis. The patient is a poor surgical candidate. TLSO brace has been ordered.  New events last 24 hours / Subjective: Patient evaluated with iPad interpreter available.  He states that his main complaint is his continued back pain and his inability to sleep.  He states that the current pain regimen that is provided is not giving him enough relief.  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 New discitis at T8-9 and T9-T10 with T8 and T10 osteomyelitis. Moderate to severe spinal stenosis at T8-T9. Persistent extensive anterior paravertebral soft tissue inflammation/phlegmon. Decreased posterior paraspinal inflammation without residual or new abscess. -Status post aspiration of T9 abscess by IR on 04/15/2019.  Deep tissue culture growing staph aureus -Status post TEE 04/18/2018 and demonstrated no vegetations or evidence of persistent endocarditis -Neurosurgery, Dr. [name], has continued to recommend medical management of patient's T8-T-10 osteomyelitis/diskitis. The patient is a poor surgical candidate. Surgical approach will be withheld pending neurologic decline related to stenosis or subacute/chronic development of significant kyphotic deformity requiring stabilization. TLSO brace has been ordered -Repeat blood culture 04/19/2019 negative -Infectious disease following.  Continue IV Cetaroline and Daptomycin for a total of 8 weeks. Last  day of therapy will be 06/16/2019  Peripheral artery disease -Right first through fifth toes dry gangrene.  Status  post bilateral arteriogram with bilateral runoff and stent placement in right EIA. The patient returned to vascular lab on 04/22/2019 for right SFA to TP trunk bypass for popliteal occlusion in addition to iliac lesion -Continue Plavix  History of DVT -On Eliquis which is on hold due to possible procedure, heldsince 04/14/2019   Hyperlipidemia -Continue Lipitor  Possible rectal mass -Will need GI evaluation when more stable and bacteremia has cleared  Type 2 diabetes with hyperglycemia and diabetic neuropathy -Hemoglobin A1c8.3 on 04/14/2019. Continue Lantus, sliding scale insulin     DVT prophylaxis: SCD  Code Status: Full Family Communication: None at bedside Disposition Plan: Pending clinical improvement in back pain. Home health on discharge with OPAT.    Consultants:   Vascular surgery  Interventional radiology  Infectious disease  Wound Care  Neurosurgery   Antimicrobials:  Anti-infectives (From admission, onward)   Start     Dose/Rate Route Frequency Ordered Stop   04/22/19 2000  DAPTOmycin (CUBICIN) 850 mg in sodium chloride 0.9 % IVPB     850 mg 234 mL/hr over 30 Minutes Intravenous Daily 04/22/19 1652     04/22/19 1700  ceftaroline (TEFLARO) 600 mg in sodium chloride 0.9 % 250 mL IVPB     600 mg 250 mL/hr over 60 Minutes Intravenous Every 8 hours 04/22/19 1652     04/22/19 1600  ceFAZolin (ANCEF) IVPB 2g/100 mL premix     2 g 200 mL/hr over 30 Minutes Intravenous Every 8 hours 04/22/19 1525 04/23/19 0217   04/22/19 0600  ceFAZolin (ANCEF) IVPB 2g/100 mL premix    Note to Pharmacy: Send with pt to OR   2 g 200 mL/hr over 30 Minutes Intravenous To Short Stay 04/21/19 0743 04/22/19 0813   04/18/19 1800  vancomycin (VANCOCIN) IVPB 1000 mg/200 mL premix  Status:  Discontinued     1,000 mg 200 mL/hr over 60 Minutes Intravenous Every 12  hours 04/18/19 0718 04/22/19 1652   04/14/19 1600  vancomycin (VANCOREADY) IVPB 1500 mg/300 mL  Status:  Discontinued     1,500 mg 150 mL/hr over 120 Minutes Intravenous Every 12 hours 04/14/19 1240 04/18/19 0718   04/13/19 2200  ceFEPIme (MAXIPIME) 2 g in sodium chloride 0.9 % 100 mL IVPB  Status:  Discontinued     2 g 200 mL/hr over 30 Minutes Intravenous Every 8 hours 04/13/19 1428 04/14/19 1028   04/13/19 1430  vancomycin (VANCOCIN) IVPB 1000 mg/200 mL premix  Status:  Discontinued     1,000 mg 200 mL/hr over 60 Minutes Intravenous Every 12 hours 04/13/19 1428 04/14/19 1240   04/13/19 1415  ceFEPIme (MAXIPIME) 2 g in sodium chloride 0.9 % 100 mL IVPB     2 g 200 mL/hr over 30 Minutes Intravenous  Once 04/13/19 1411 04/13/19 1505   04/13/19 1415  metroNIDAZOLE (FLAGYL) IVPB 500 mg     500 mg 100 mL/hr over 60 Minutes Intravenous  Once 04/13/19 1411 04/13/19 1600   04/13/19 1415  vancomycin (VANCOCIN) IVPB 1000 mg/200 mL premix  Status:  Discontinued     1,000 mg 200 mL/hr over 60 Minutes Intravenous  Once 04/13/19 1411 04/13/19 1428        Objective: Vitals:   04/27/19 2345 04/28/19 0622 04/28/19 0820 04/28/19 1058  BP: (!) 154/86 (!) 159/98 (!) 149/99 (!) 156/100  Pulse: 90 88 96 99  Resp: 18 20 20 20   Temp: 98.1 F (36.7 C) 97.8 F (36.6 C) 98.3 F (36.8 C) 98.6 F (37 C)  TempSrc:  Oral Oral Oral Oral  SpO2: 100% 100% 92% 97%  Weight:      Height:        Intake/Output Summary (Last 24 hours) at 04/28/2019 1304 Last data filed at 04/28/2019 0821 Gross per 24 hour  Intake -  Output 700 ml  Net -700 ml   Filed Weights   04/20/19 0412 04/21/19 0308 04/22/19 0406  Weight: 88.3 kg 87.4 kg 87.9 kg    Examination:  General exam: Appears calm and comfortable  Respiratory system: Clear to auscultation. Respiratory effort normal. No respiratory distress. No conversational dyspnea.  Cardiovascular system: S1 & S2 heard, RRR. No murmurs. No pedal edema. Gastrointestinal  system: Abdomen is nondistended, soft and nontender. Normal bowel sounds heard. Central nervous system: Alert and oriented. No focal neurological deficits. Speech clear.  Extremities: Symmetric in appearance  Skin: Incision over the right lower extremity clean and dry Psychiatry: Judgement and insight appear normal. Mood & affect appropriate.   Data Reviewed: I have personally reviewed following labs and imaging studies  CBC: Recent Labs  Lab 04/24/19 0447 04/25/19 0520 04/26/19 0424 04/27/19 0500 04/28/19 0500  WBC 8.1 5.8 7.3 5.6 5.6  NEUTROABS 5.5 3.8 5.4 3.5 3.6  HGB 7.4* 7.0* 7.1* 7.2* 7.2*  HCT 23.6* 22.0* 23.0* 23.0* 23.7*  MCV 81.1 81.5 82.4 81.0 81.2  PLT 426* 388 435* 497* 571*   Basic Metabolic Panel: Recent Labs  Lab 04/23/19 0510 04/24/19 0447 04/25/19 0520 04/26/19 0424 04/27/19 0500  NA 136 135 136 134* 138  K 3.6 3.7 3.5 3.8 3.6  CL 102 103 103 101 104  CO2 26 25 26 25 25   GLUCOSE 142* 155* 114* 206* 146*  BUN 9 7 5* 8 6  CREATININE 0.86 0.88 0.81 0.81 0.75  CALCIUM 8.1* 8.2* 8.4* 8.2* 8.4*   GFR: Estimated Creatinine Clearance: 134.4 mL/min (by C-G formula based on SCr of 0.75 mg/dL). Liver Function Tests: No results for input(s): AST, ALT, ALKPHOS, BILITOT, PROT, ALBUMIN in the last 168 hours. No results for input(s): LIPASE, AMYLASE in the last 168 hours. No results for input(s): AMMONIA in the last 168 hours. Coagulation Profile: Recent Labs  Lab 04/22/19 0437  INR 1.0   Cardiac Enzymes: Recent Labs  Lab 04/23/19 0831 04/24/19 0447  CKTOTAL 34* 40*   BNP (last 3 results) No results for input(s): PROBNP in the last 8760 hours. HbA1C: No results for input(s): HGBA1C in the last 72 hours. CBG: Recent Labs  Lab 04/27/19 1120 04/27/19 1623 04/27/19 2104 04/28/19 0621 04/28/19 1057  GLUCAP 146* 158* 149* 113* 150*   Lipid Profile: No results for input(s): CHOL, HDL, LDLCALC, TRIG, CHOLHDL, LDLDIRECT in the last 72 hours. Thyroid  Function Tests: No results for input(s): TSH, T4TOTAL, FREET4, T3FREE, THYROIDAB in the last 72 hours. Anemia Panel: No results for input(s): VITAMINB12, FOLATE, FERRITIN, TIBC, IRON, RETICCTPCT in the last 72 hours. Sepsis Labs: No results for input(s): PROCALCITON, LATICACIDVEN in the last 168 hours.  Recent Results (from the past 240 hour(s))  Culture, blood (routine x 2)     Status: None   Collection Time: 04/19/19  3:06 PM   Specimen: BLOOD  Result Value Ref Range Status   Specimen Description BLOOD ASSIST CONTROL  Final   Special Requests   Final    BOTTLES DRAWN AEROBIC ONLY Blood Culture adequate volume   Culture   Final    NO GROWTH 5 DAYS Performed at [facility], [address] [place], [place]  Report Status 04/24/2019 FINAL  Final  Culture, blood (routine x 2)     Status: None   Collection Time: 04/19/19  3:06 PM   Specimen: BLOOD LEFT HAND  Result Value Ref Range Status   Specimen Description BLOOD LEFT HAND  Final   Special Requests   Final    BOTTLES DRAWN AEROBIC ONLY Blood Culture adequate volume   Culture   Final    NO GROWTH 5 DAYS Performed at [facility], [address] [place], [place] [address]    Report Status 04/24/2019 FINAL  Final  Surgical pcr screen     Status: None   Collection Time: 04/22/19  4:06 AM   Specimen: Nasal Mucosa; Nasal Swab  Result Value Ref Range Status   MRSA, PCR NEGATIVE NEGATIVE Final   Staphylococcus aureus NEGATIVE NEGATIVE Final    Comment: (NOTE) The Xpert SA Assay (FDA approved for NASAL specimens in patients 26 years of age and older), is one component of a comprehensive surveillance program. It is not intended to diagnose infection nor to guide or monitor treatment. Performed at [facility], [address], [place], [place] [address]       Radiology Studies: No results found.    Scheduled Meds: . sodium chloride   Intravenous Once  . atorvastatin  10 mg Oral q1800  .  Chlorhexidine Gluconate Cloth  6 each Topical Daily  . clopidogrel  75 mg Oral Q breakfast  . docusate sodium  100 mg Oral Daily  . insulin aspart  0-9 Units Subcutaneous TID WC  . insulin glargine  15 Units Subcutaneous QHS  . pantoprazole  40 mg Oral Daily  . pneumococcal 23 valent vaccine  0.5 mL Intramuscular Tomorrow-1000  . saccharomyces boulardii  250 mg Oral BID  . senna-docusate  2 tablet Oral BID  . sodium chloride flush  10-40 mL Intracatheter Q12H  . sodium chloride flush  3 mL Intravenous Once  . sodium chloride flush  3 mL Intravenous Q12H   Continuous Infusions: . sodium chloride 250 mL (04/13/19 2351)  . sodium chloride Stopped (04/13/19 2230)  . sodium chloride    . sodium chloride 100 mL/hr at 04/23/19 0951  . ceFTAROline (TEFLARO) IV 600 mg (04/28/19 1027)  . DAPTOmycin (CUBICIN)  IV 850 mg (04/27/19 2337)  . magnesium sulfate bolus IVPB       LOS: 15 days      Time spent: 35 minutes   Noralee Stain, DO [facility] 04/28/2019, 1:04 PM   Available via Epic secure chat 7am-7pm After these hours, please refer to coverage provider listed on [email or web address]

## 2019-04-28 NOTE — Progress Notes (Signed)
Occupational Therapy Treatment Patient Details Name: Thomas [name] MRN: [id number] DOB: 17-May-1975 Today's Date: 04/28/2019    History of present illness Patient is a 44 y/o male who presents with back pain, dizziness and fall at home. Recent long hospital stay at [facility] for disseminated MRSA bacteremia, osteomyelitis and discitis. Chest CT, abdomen and pelvis notable for findings of advanced osteomyelitis involving T9 as well as discitis T8-9, T9-10. s/p successful fluoro guided T9 vertebral body aspiration and biopsy 12/31. Positive blood cultures for MRSA. Also admitted for necrosis of right toes 1-5. s/p Right pop-TP trunk bypass with RSVG 04/22/19.   OT comments  Pt making steady progress towards OT goals this session. Pt continues to c/o back pain but agreeable to OT session. Pt complete functional mobility greater than a household distance with min guard and RW. Continue txt per POC. DC plan remains appropriate.    Follow Up Recommendations  Home health OT;Supervision/Assistance - 24 hour    Equipment Recommendations  Other (comment);3 in 1 bedside commode    Recommendations for Other Services      Precautions / Restrictions Precautions Precautions: Fall Precaution Comments: PCA discontinued, R thigh incisions (hematoma resolved), TLSO for comfort/support, declined donning Restrictions Weight Bearing Restrictions: No       Mobility Bed Mobility Overal bed mobility: Needs Assistance Bed Mobility: Supine to Sit     Supine to sit: HOB elevated;Supervision     General bed mobility comments: pt able to come to EOB without assist, increased time and effort noted  Transfers Overall transfer level: Needs assistance Equipment used: Rolling walker (2 wheeled) Transfers: Sit to/from Stand Sit to Stand: Supervision         General transfer comment: supervision for safety    Balance Overall balance assessment: Needs assistance;History of  Falls Sitting-balance support: Feet supported;Bilateral upper extremity supported Sitting balance-Leahy Scale: Good Sitting balance - Comments: supervision   Standing balance support: Bilateral upper extremity supported Standing balance-Leahy Scale: Poor Standing balance comment: reliant on BUE support for amb                           ADL either performed or assessed with clinical judgement   ADL Overall ADL's : Needs assistance/impaired                         Toilet Transfer: Min IT sales professional Details (indicate cue type and reason): simulated via functional mobility with RW         Functional mobility during ADLs: Min guard;Rolling walker General ADL Comments: pt limited by pain but agreeable to functional mobiilty in prep for higher level ADLs     Vision Patient Visual Report: No change from baseline     Perception     Praxis      Cognition Arousal/Alertness: Awake/alert Behavior During Therapy: WFL for tasks assessed/performed Overall Cognitive Status: Within Functional Limits for tasks assessed                                 General Comments: pt able to communicate with therapist without interpreter. overall, WFL for simple tasks.        Exercises     Shoulder Instructions       General Comments      Pertinent Vitals/ Pain       Pain Assessment: 0-10 Pain Score: 9  Pain Location: back Pain Descriptors / Indicators: Discomfort;Grimacing;Moaning Pain Intervention(s): Limited activity within patient's tolerance;Monitored during session;Repositioned;Heat applied  Home Living                                          Prior Functioning/Environment              Frequency  Min 2X/week        Progress Toward Goals  OT Goals(current goals can now be found in the care plan section)  Progress towards OT goals: Progressing toward goals  Acute Rehab OT Goals Patient  Stated Goal: to walk more so he can go home OT Goal Formulation: With patient Time For Goal Achievement: 04/30/19 Potential to Achieve Goals: Good  Plan Discharge plan remains appropriate    Co-evaluation                 AM-PAC OT "6 Clicks" Daily Activity     Outcome Measure   Help from another person eating meals?: None Help from another person taking care of personal grooming?: None Help from another person toileting, which includes using toliet, bedpan, or urinal?: A Little Help from another person bathing (including washing, rinsing, drying)?: A Lot Help from another person to put on and taking off regular upper body clothing?: None Help from another person to put on and taking off regular lower body clothing?: Total 6 Click Score: 18    End of Session Equipment Utilized During Treatment: Gait belt;Rolling walker  OT Visit Diagnosis: Unsteadiness on feet (R26.81);Other abnormalities of gait and mobility (R26.89);Muscle weakness (generalized) (M62.81)   Activity Tolerance Patient tolerated treatment well   Patient Left in chair;with call bell/phone within reach   Nurse Communication Mobility status;Other (comment)(requesting pain meds)        Time: 1100-1116 OT Time Calculation (min): 16 min  Charges: OT General Charges $OT Visit: 1 Visit OT Treatments $Therapeutic Activity: 8-22 mins  Lanier Clam., COTA/L Acute Rehabilitation Services [phone]   Ihor Gully 04/28/2019, 12:27 PM

## 2019-04-29 DIAGNOSIS — M4625 Osteomyelitis of vertebra, thoracolumbar region: Secondary | ICD-10-CM

## 2019-04-29 LAB — CBC
HCT: 24.7 % — ABNORMAL LOW (ref 39.0–52.0)
Hemoglobin: 7.6 g/dL — ABNORMAL LOW (ref 13.0–17.0)
MCH: 25.2 pg — ABNORMAL LOW (ref 26.0–34.0)
MCHC: 30.8 g/dL (ref 30.0–36.0)
MCV: 82.1 fL (ref 80.0–100.0)
Platelets: 563 10*3/uL — ABNORMAL HIGH (ref 150–400)
RBC: 3.01 MIL/uL — ABNORMAL LOW (ref 4.22–5.81)
RDW: 15.3 % (ref 11.5–15.5)
WBC: 7.1 10*3/uL (ref 4.0–10.5)
nRBC: 0 % (ref 0.0–0.2)

## 2019-04-29 LAB — GLUCOSE, CAPILLARY
Glucose-Capillary: 98 mg/dL (ref 70–99)
Glucose-Capillary: 171 mg/dL — ABNORMAL HIGH (ref 70–99)
Glucose-Capillary: 101 mg/dL — ABNORMAL HIGH (ref 70–99)
Glucose-Capillary: 149 mg/dL — ABNORMAL HIGH (ref 70–99)

## 2019-04-29 MED ORDER — APIXABAN 5 MG PO TABS: 5.0000 mg | ORAL_TABLET | Freq: Every day | ORAL | Status: DC

## 2019-04-29 MED ORDER — LISINOPRIL 20 MG PO TABS
20.0000 mg | ORAL_TABLET | Freq: Every day | ORAL | Status: DC
  Administered 2019-04-29 – 2019-05-03 (×5): 20 mg via ORAL
  Filled 2019-04-29 (×2): qty 2
  Filled 2019-04-29: qty 1
  Filled 2019-04-29: qty 2
  Filled 2019-04-29: qty 1

## 2019-04-29 MED ORDER — HYDROCHLOROTHIAZIDE 25 MG PO TABS
25.0000 mg | ORAL_TABLET | Freq: Every day | ORAL | Status: DC
  Administered 2019-04-29 – 2019-05-03 (×5): 25 mg via ORAL
  Filled 2019-04-29 (×5): qty 1

## 2019-04-29 MED ORDER — APIXABAN 5 MG PO TABS
5.0000 mg | ORAL_TABLET | Freq: Two times a day (BID) | ORAL | Status: DC
  Administered 2019-04-29 – 2019-05-03 (×9): 5 mg via ORAL
  Filled 2019-04-29 (×9): qty 1

## 2019-04-29 MED ORDER — GABAPENTIN 100 MG PO CAPS
100.0000 mg | ORAL_CAPSULE | Freq: Every day | ORAL | Status: DC
  Administered 2019-04-29 – 2019-05-02 (×4): 100 mg via ORAL
  Filled 2019-04-29 (×4): qty 1

## 2019-04-29 NOTE — Progress Notes (Signed)
[facility] for Infectious Disease  Date of Admission:  04/13/2019     Total days of antibiotics 16 Day 5 of Ceftaroline and Daptomycin         ASSESSMENT:  Thomas [name] has remained afebrile and is tolerating his daptomycin and ceftaroline with no adverse side effects. Continues to have significant amount of back pain. Will speak with nursing about back brace.  [facility] has arranged for him to receive Ceftaroline at home and he will attend Short Stay on a daily basis to receive his daptomycin with transportation being arranged through a friend. We will continue daptomycin and ceftaroline for 8 weeks using 04/22/19 as the initial start date with end date planned for 06/17/19. Follow up arranged in the [place] clinic with Dr. Megan Salon in 4 weeks.   PLAN:  1. Continue daptomycin and ceftaroline 2. OPAT orders placed. [place] for discharge from ID perspective when outpatient antibiotic plan has been finalized.  4. Follow up with Dr. Megan Salon as noted.   Diagnosis: Persistent Disseminated MRSA Infection / Osteomyelitis/Discitis  Culture Result: MRSA  No Known Allergies  OPAT Orders Discharge antibiotics: Daptomycin and Ceftaroline Per pharmacy protocol  Aim for Vancomycin trough 15-20 or AUC 400-550 (unless otherwise indicated) Duration: 8 weeks End Date: 06/17/19  [facility] Care Per Protocol:  Labs weekly while on IV antibiotics: _X_ CBC with differential __ BMP _X_ CMP _X_ CRP _X_ ESR __ Vancomycin trough _X_ CK  _X_ Please pull PIC at completion of IV antibiotics __ Please leave PIC in place until doctor has seen patient or been notified  Fax weekly labs to [phone]  Clinic Follow Up Appt: Dr. Megan Salon at 10:30am on 2/10    Principal Problem:   MRSA bacteremia Active Problems:   Diabetes mellitus type II, uncontrolled ([place])   HTN (hypertension)   Osteomyelitis of spine ([place])   Rectal mass   DVT of popliteal vein ([facility])   Gangrene of toe of right  foot ([place])   Discitis of thoracolumbar region   Endocarditis of mitral valve   . sodium chloride   Intravenous Once  . atorvastatin  10 mg Oral q1800  . Chlorhexidine Gluconate Cloth  6 each Topical Daily  . clopidogrel  75 mg Oral Q breakfast  . docusate sodium  100 mg Oral Daily  . insulin aspart  0-9 Units Subcutaneous TID WC  . insulin glargine  15 Units Subcutaneous QHS  . pantoprazole  40 mg Oral Daily  . pneumococcal 23 valent vaccine  0.5 mL Intramuscular Tomorrow-1000  . saccharomyces boulardii  250 mg Oral BID  . senna-docusate  2 tablet Oral BID  . sodium chloride flush  10-40 mL Intracatheter Q12H  . sodium chloride flush  3 mL Intravenous Q12H    SUBJECTIVE:  Afebrile with no acute events overnight. Having continued back pain that is poorly controlled.   Thomas [name] primary preferred language is Arabic. A medical interpreter was used via telephone to aid in communication.   No Known Allergies   Review of Systems: Review of Systems  Constitutional: Negative for chills, fever and weight loss.  Respiratory: Negative for cough, shortness of breath and wheezing.   Cardiovascular: Negative for chest pain and leg swelling.  Gastrointestinal: Negative for abdominal pain, constipation, diarrhea, nausea and vomiting.  Musculoskeletal: Positive for back pain.  Skin: Negative for rash.      OBJECTIVE: Vitals:   04/28/19 1820 04/28/19 2021 04/29/19 0508 04/29/19 0813  BP: 136/86 127/79 (!) 148/94  Pulse:  89 82 100  Resp:  14  16  Temp:  97.6 F (36.4 C) 97.6 F (36.4 C) 98.3 F (36.8 C)  TempSrc:  Oral Oral Oral  SpO2: 99% 98% 98% 99%  Weight:      Height:       Body mass index is 27.79 kg/m.  Physical Exam Constitutional:      General: He is not in acute distress.    Appearance: He is well-developed.  Cardiovascular:     Rate and Rhythm: Normal rate and regular rhythm.     Heart sounds: Normal heart sounds.  Pulmonary:     Effort: Pulmonary  effort is normal.     Breath sounds: Normal breath sounds.  Skin:    General: Skin is warm and dry.     Comments: Vascular surgery sites appear well approximated and without evidence of infection or drainage.  Neurological:     Mental Status: He is alert and oriented to person, place, and time.  Psychiatric:        Behavior: Behavior normal.        Thought Content: Thought content normal.        Judgment: Judgment normal.     Lab Results Lab Results  Component Value Date   WBC 7.1 04/29/2019   HGB 7.6 (L) 04/29/2019   HCT 24.7 (L) 04/29/2019   MCV 82.1 04/29/2019   PLT 563 (H) 04/29/2019    Lab Results  Component Value Date   CREATININE 0.75 04/27/2019   BUN 6 04/27/2019   NA 138 04/27/2019   K 3.6 04/27/2019   CL 104 04/27/2019   CO2 25 04/27/2019    Lab Results  Component Value Date   ALT 25 04/13/2019   AST 14 (L) 04/13/2019   ALKPHOS 84 04/13/2019   BILITOT 0.4 04/13/2019     Microbiology: Recent Results (from the past 240 hour(s))  Culture, blood (routine x 2)     Status: None   Collection Time: 04/19/19  3:06 PM   Specimen: BLOOD  Result Value Ref Range Status   Specimen Description BLOOD ASSIST CONTROL  Final   Special Requests   Final    BOTTLES DRAWN AEROBIC ONLY Blood Culture adequate volume   Culture   Final    NO GROWTH 5 DAYS Performed at [facility], [address], [place], [place] [address]    Report Status 04/24/2019 FINAL  Final  Culture, blood (routine x 2)     Status: None   Collection Time: 04/19/19  3:06 PM   Specimen: BLOOD LEFT HAND  Result Value Ref Range Status   Specimen Description BLOOD LEFT HAND  Final   Special Requests   Final    BOTTLES DRAWN AEROBIC ONLY Blood Culture adequate volume   Culture   Final    NO GROWTH 5 DAYS Performed at [facility], [place] [address], [place], [place] [address]    Report Status 04/24/2019 FINAL  Final  Surgical pcr screen     Status: None   Collection Time: 04/22/19  4:06 AM     Specimen: Nasal Mucosa; Nasal Swab  Result Value Ref Range Status   MRSA, PCR NEGATIVE NEGATIVE Final   Staphylococcus aureus NEGATIVE NEGATIVE Final    Comment: (NOTE) The Xpert SA Assay (FDA approved for NASAL specimens in patients 75 years of age and older), is one component of a comprehensive surveillance program. It is not intended to diagnose infection nor to guide  or monitor treatment. Performed at [facility], [place] [address] [place], [place] [address]      Terri Piedra, [place] [phone] Pager  04/29/2019  8:50 AM

## 2019-04-29 NOTE — Progress Notes (Signed)
Physical Therapy Treatment Patient Details Name: Thomas [name] MRN: [id number] DOB: [birth year] Today's Date: 04/29/2019    History of Present Illness Patient is a 44 y/o male who presents with back pain, dizziness and fall at home. Recent long hospital stay at [facility] for disseminated MRSA bacteremia, osteomyelitis and discitis. Chest CT, abdomen and pelvis notable for findings of advanced osteomyelitis involving T9 as well as discitis T8-9, T9-10. s/p successful fluoro guided T9 vertebral body aspiration and biopsy 12/31. Positive blood cultures for MRSA. Also admitted for necrosis of right toes 1-5. s/p Right pop-TP trunk bypass with RSVG 04/22/19.    PT Comments    Patient seen for mobility progression. Pt with increased back pain with ambulation and reports feeling like TLSO did not help with decreasing pain level. Pt tolerated gait distance of 150 ft with RW. Current plan remains appropriate.     Follow Up Recommendations  Home health PT;Supervision - Intermittent     Equipment Recommendations  None recommended by PT    Recommendations for Other Services       Precautions / Restrictions Precautions Precautions: Fall Restrictions Weight Bearing Restrictions: No    Mobility  Bed Mobility Overal bed mobility: Needs Assistance Bed Mobility: Supine to Sit Rolling: Supervision Sidelying to sit: HOB elevated;Supervision       General bed mobility comments: use of rails  Transfers Overall transfer level: Needs assistance Equipment used: Rolling walker (2 wheeled) Transfers: Sit to/from Stand Sit to Stand: Supervision         General transfer comment: cues for safe hand placement  Ambulation/Gait Ambulation/Gait assistance: Supervision Gait Distance (Feet): 150 Feet Assistive device: Rolling walker (2 wheeled) Gait Pattern/deviations: Step-through pattern;Decreased stride length;Trunk flexed Gait velocity: decreased   General Gait Details: supervision  for safety; increased pain with ambulation so distance limited    Stairs             Wheelchair Mobility    Modified Rankin (Stroke Patients Only)       Balance Overall balance assessment: Needs assistance;History of Falls Sitting-balance support: Feet supported;Bilateral upper extremity supported Sitting balance-Leahy Scale: Good     Standing balance support: Bilateral upper extremity supported Standing balance-Leahy Scale: Poor                              Cognition Arousal/Alertness: Awake/alert Behavior During Therapy: WFL for tasks assessed/performed Overall Cognitive Status: Within Functional Limits for tasks assessed                                 General Comments: video interpreter used throughout session      Exercises      General Comments General comments (skin integrity, edema, etc.): pt with TLSO donned in bed upon arrival and educated he can take brace off in bed and then educated on proper way to don brace while in sitting; end of session pt reports brace did not make a difference in back pain      Pertinent Vitals/Pain Pain Assessment: Faces Faces Pain Scale: Hurts whole lot Pain Location: back (increased pain after ambulating) Pain Descriptors / Indicators: Grimacing;Moaning;Other (Comment)("feels like electricity") Pain Intervention(s): Limited activity within patient's tolerance;Monitored during session;Repositioned;Premedicated before session;Patient requesting pain meds-RN notified;Heat applied    Home Living  Prior Function            PT Goals (current goals can now be found in the care plan section) Progress towards PT goals: Progressing toward goals    Frequency    Min 3X/week      PT Plan Current plan remains appropriate    Co-evaluation              AM-PAC PT "6 Clicks" Mobility   Outcome Measure  Help needed turning from your back to your side while in a  flat bed without using bedrails?: A Little Help needed moving from lying on your back to sitting on the side of a flat bed without using bedrails?: A Little Help needed moving to and from a bed to a chair (including a wheelchair)?: A Little Help needed standing up from a chair using your arms (e.g., wheelchair or bedside chair)?: A Little Help needed to walk in hospital room?: A Little Help needed climbing 3-5 steps with a railing? : A Lot 6 Click Score: 17    End of Session Equipment Utilized During Treatment: Gait belt Activity Tolerance: Patient tolerated treatment well Patient left: in chair;with call bell/phone within reach Nurse Communication: Mobility status;Patient requests pain meds PT Visit Diagnosis: Pain;Muscle weakness (generalized) (M62.81);Unsteadiness on feet (R26.81) Pain - Right/Left: Right Pain - part of body: Leg(and back)     Time: 1410-1441 PT Time Calculation (min) (ACUTE ONLY): 31 min  Charges:  $Gait Training: 8-22 mins $Therapeutic Activity: 8-22 mins                     Erline Levine, PTA Acute Rehabilitation Services Pager: [phone] Office: [phone]     Carolynne Edouard 04/29/2019, 4:24 PM

## 2019-04-29 NOTE — Progress Notes (Addendum)
PROGRESS NOTE    Thomas [name]  ZOX:[id number] DOB: 12/17/1975 DOA: 04/13/2019 PCP: Mackie Pai, PA-C     Brief Narrative:  Thomas [name] is a 44 y.o.malewith medical history significant ofDM2 poorly controlled.Recent hospitalization at [facility] for disseminated MRSA bacteremia, osteomyelitis and discitis. Completed 6 weeks course of IV antibiotics on April 04, 2019. Source of bacteremia thought to be from skin source (per previous facility report, area on toe that appeared cellulitic, area in R back that was erythematous and an abrasion of the lateral R aspect of his shin, abscess formation in the R calf and pyomyositis of the L vastus intermedius).Developed necrosis of his R 1-5 digits sincehisdischarge.  Presentedherewith worsening back pain, generalized weakness, fever and recent fall. CT chest, abdomen and pelvis notable for findings of advanced osteomyelitis involving T9 as well as discitis.Positive blood cultures for MRSA. Seen by infectious disease,recommendations for IV Vancomycin. Pt underwent TEE on 04/19/2019 which did not demonstrate vegetations or other evidence of persistent endocarditis.   He was also taken to the vascular lab by Dr. Carlis Abbott for Aortogram, bilateral lower extremity arteriogram with runoff, right external iliac artery angioplasty with stent placement under moderate conscious sedation. This was performed for critical ischemia of the right lower extremity with gangrene of digits 1-5 of the right foot. Dr. Carlis Abbott performed a right SFA to TP trunk bypass for popliteal occlusion in addition to iliac lesion on 04/22/2019. The patient apparently developed a thigh hematoma and received transfusion of 2 units PRBC.  Neurosurgery was reconsulted on 04/24/2019 for re-evaluation of thoracic spine discitis due to concern for persistent infection in this area. I appreciate neurosurgery's assistance. Dr. Kathyrn Sheriff has continued to recommend medical  management of the patient's T8-T-10 osteomyelitis/diskitis. The patient is a poor surgical candidate. TLSO brace has been ordered.  New events last 24 hours / Subjective: Patient evaluated with audio interpreter.  Continues to complain of back pain, does not think that the nurses have been treating his pain adequately.  However, on discussing with RN and reviewing MAR, patient has been getting alternating oxycodone, tramadol as well as Tylenol as ordered.  Assessment & Plan:   Principal Problem:   MRSA bacteremia Active Problems:   Diabetes mellitus type II, uncontrolled ([place])   HTN (hypertension)   Osteomyelitis of spine (HCC)   Rectal mass   DVT of popliteal vein (HCC)   Gangrene of toe of right foot (HCC)   Discitis of thoracolumbar region   Endocarditis of mitral valve   MRSA bacteremia and recurrent thoracic spine discitis/vertebral osteomyelitis -Blood culture drawn on 04/13/2019 +MRSA -MRI revealed progressive T9 osteomyelitis with increased cultures evaluation and vertebral body collapse. New discitis at T8-9 and T9-T10 with T8 and T10 osteomyelitis. Moderate to severe spinal stenosis at T8-T9. Persistent extensive anterior paravertebral soft tissue inflammation/phlegmon. Decreased posterior paraspinal inflammation without residual or new abscess. -Status post aspiration of T9 abscess by IR on 04/15/2019.  Deep tissue culture growing staph aureus -Status post TEE 04/18/2018 and demonstrated no vegetations or evidence of persistent endocarditis -Neurosurgery, Dr. Kathyrn Sheriff, has continued to recommend medical management of patient's T8-T-10 osteomyelitis/diskitis. The patient is a poor surgical candidate. Surgical approach will be withheld pending neurologic decline related to stenosis or subacute/chronic development of significant kyphotic deformity requiring stabilization. TLSO brace has been ordered -Repeat blood culture 04/19/2019 negative -Infectious disease following.  Continue  IV Cetaroline and Daptomycin for a total of 8 weeks. Last day of therapy will be 06/16/2019  Peripheral artery disease -Right first through  fifth toes dry gangrene.  Status post bilateral arteriogram with bilateral runoff and stent placement in right EIA. The patient returned to vascular lab on 04/22/2019 for right SFA to TP trunk bypass for popliteal occlusion in addition to iliac lesion -Continue Plavix  History of DVT -Resume Eliquis  Essential hypertension -Resume HCTZ, lisinopril  Hyperlipidemia -Continue Lipitor  Possible rectal mass -Will need GI evaluation when more stable and bacteremia has cleared  Type 2 diabetes with hyperglycemia and diabetic neuropathy -Hemoglobin A1c8.3 on 04/14/2019 -Continue Lantus, sliding scale insulin -Resume gabapentin   DVT prophylaxis: Eliquis Code Status: Full Family Communication: None at bedside Disposition Plan: Pending clinical improvement in back pain and clearance for discharge from vascular surgery. Home health on discharge with OPAT   Consultants:   Vascular surgery  Interventional radiology  Infectious disease  Wound Care  Neurosurgery   Antimicrobials:  Anti-infectives (From admission, onward)   Start     Dose/Rate Route Frequency Ordered Stop   04/22/19 2000  DAPTOmycin (CUBICIN) 850 mg in sodium chloride 0.9 % IVPB     850 mg 234 mL/hr over 30 Minutes Intravenous Daily 04/22/19 1652     04/22/19 1700  ceftaroline (TEFLARO) 600 mg in sodium chloride 0.9 % 250 mL IVPB     600 mg 250 mL/hr over 60 Minutes Intravenous Every 8 hours 04/22/19 1652     04/22/19 1600  ceFAZolin (ANCEF) IVPB 2g/100 mL premix     2 g 200 mL/hr over 30 Minutes Intravenous Every 8 hours 04/22/19 1525 04/23/19 0217   04/22/19 0600  ceFAZolin (ANCEF) IVPB 2g/100 mL premix    Note to Pharmacy: Send with pt to OR   2 g 200 mL/hr over 30 Minutes Intravenous To Short Stay 04/21/19 0743 04/22/19 0813   04/18/19 1800  vancomycin (VANCOCIN)  IVPB 1000 mg/200 mL premix  Status:  Discontinued     1,000 mg 200 mL/hr over 60 Minutes Intravenous Every 12 hours 04/18/19 0718 04/22/19 1652   04/14/19 1600  vancomycin (VANCOREADY) IVPB 1500 mg/300 mL  Status:  Discontinued     1,500 mg 150 mL/hr over 120 Minutes Intravenous Every 12 hours 04/14/19 1240 04/18/19 0718   04/13/19 2200  ceFEPIme (MAXIPIME) 2 g in sodium chloride 0.9 % 100 mL IVPB  Status:  Discontinued     2 g 200 mL/hr over 30 Minutes Intravenous Every 8 hours 04/13/19 1428 04/14/19 1028   04/13/19 1430  vancomycin (VANCOCIN) IVPB 1000 mg/200 mL premix  Status:  Discontinued     1,000 mg 200 mL/hr over 60 Minutes Intravenous Every 12 hours 04/13/19 1428 04/14/19 1240   04/13/19 1415  ceFEPIme (MAXIPIME) 2 g in sodium chloride 0.9 % 100 mL IVPB     2 g 200 mL/hr over 30 Minutes Intravenous  Once 04/13/19 1411 04/13/19 1505   04/13/19 1415  metroNIDAZOLE (FLAGYL) IVPB 500 mg     500 mg 100 mL/hr over 60 Minutes Intravenous  Once 04/13/19 1411 04/13/19 1600   04/13/19 1415  vancomycin (VANCOCIN) IVPB 1000 mg/200 mL premix  Status:  Discontinued     1,000 mg 200 mL/hr over 60 Minutes Intravenous  Once 04/13/19 1411 04/13/19 1428       Objective: Vitals:   04/28/19 1820 04/28/19 2021 04/29/19 0508 04/29/19 0813  BP: 136/86 127/79 (!) 148/94   Pulse:  89 82 100  Resp:  14  16  Temp:  97.6 F (36.4 C) 97.6 F (36.4 C) 98.3 F (36.8 C)  TempSrc:  Oral Oral Oral  SpO2: 99% 98% 98% 99%  Weight:      Height:        Intake/Output Summary (Last 24 hours) at 04/29/2019 1039 Last data filed at 04/29/2019 0600 Gross per 24 hour  Intake --  Output 900 ml  Net -900 ml   Filed Weights   04/20/19 0412 04/21/19 0308 04/22/19 0406  Weight: 88.3 kg 87.4 kg 87.9 kg    Examination: General exam: Appears calm and comfortable  Respiratory system: Clear to auscultation. Respiratory effort normal. Cardiovascular system: S1 & S2 heard, RRR. No pedal edema. Gastrointestinal  system: Abdomen is nondistended, soft and nontender. Normal bowel sounds heard. Central nervous system: Alert and oriented. Non focal exam. Speech clear  Skin: Incisions over the right lower extremity are clean and dry Psychiatry: Judgement and insight appear stable. Mood & affect appropriate.     Data Reviewed: I have personally reviewed following labs and imaging studies  CBC: Recent Labs  Lab 04/24/19 0447 04/24/19 0447 04/25/19 0520 04/26/19 0424 04/27/19 0500 04/28/19 0500 04/29/19 0500  WBC 8.1   < > 5.8 7.3 5.6 5.6 7.1  NEUTROABS 5.5  --  3.8 5.4 3.5 3.6  --   HGB 7.4*   < > 7.0* 7.1* 7.2* 7.2* 7.6*  HCT 23.6*   < > 22.0* 23.0* 23.0* 23.7* 24.7*  MCV 81.1   < > 81.5 82.4 81.0 81.2 82.1  PLT 426*   < > 388 435* 497* 571* 563*   < > = values in this interval not displayed.   Basic Metabolic Panel: Recent Labs  Lab 04/23/19 0510 04/24/19 0447 04/25/19 0520 04/26/19 0424 04/27/19 0500  NA 136 135 136 134* 138  K 3.6 3.7 3.5 3.8 3.6  CL 102 103 103 101 104  CO2 26 25 26 25 25   GLUCOSE 142* 155* 114* 206* 146*  BUN 9 7 5* 8 6  CREATININE 0.86 0.88 0.81 0.81 0.75  CALCIUM 8.1* 8.2* 8.4* 8.2* 8.4*   GFR: Estimated Creatinine Clearance: 134.4 mL/min (by C-G formula based on SCr of 0.75 mg/dL). Liver Function Tests: No results for input(s): AST, ALT, ALKPHOS, BILITOT, PROT, ALBUMIN in the last 168 hours. No results for input(s): LIPASE, AMYLASE in the last 168 hours. No results for input(s): AMMONIA in the last 168 hours. Coagulation Profile: No results for input(s): INR, PROTIME in the last 168 hours. Cardiac Enzymes: Recent Labs  Lab 04/23/19 0831 04/24/19 0447  CKTOTAL 34* 40*   BNP (last 3 results) No results for input(s): PROBNP in the last 8760 hours. HbA1C: No results for input(s): HGBA1C in the last 72 hours. CBG: Recent Labs  Lab 04/28/19 0621 04/28/19 1057 04/28/19 1625 04/28/19 2123 04/29/19 0632  GLUCAP 113* 150* 141* 168* 98   Lipid  Profile: No results for input(s): CHOL, HDL, LDLCALC, TRIG, CHOLHDL, LDLDIRECT in the last 72 hours. Thyroid Function Tests: No results for input(s): TSH, T4TOTAL, FREET4, T3FREE, THYROIDAB in the last 72 hours. Anemia Panel: No results for input(s): VITAMINB12, FOLATE, FERRITIN, TIBC, IRON, RETICCTPCT in the last 72 hours. Sepsis Labs: No results for input(s): PROCALCITON, LATICACIDVEN in the last 168 hours.  Recent Results (from the past 240 hour(s))  Culture, blood (routine x 2)     Status: None   Collection Time: 04/19/19  3:06 PM   Specimen: BLOOD  Result Value Ref Range Status   Specimen Description BLOOD ASSIST CONTROL  Final   Special Requests   Final    BOTTLES DRAWN  AEROBIC ONLY Blood Culture adequate volume   Culture   Final    NO GROWTH 5 DAYS Performed at [facility], [address], [place], [place] [address]    Report Status 04/24/2019 FINAL  Final  Culture, blood (routine x 2)     Status: None   Collection Time: 04/19/19  3:06 PM   Specimen: BLOOD LEFT HAND  Result Value Ref Range Status   Specimen Description BLOOD LEFT HAND  Final   Special Requests   Final    BOTTLES DRAWN AEROBIC ONLY Blood Culture adequate volume   Culture   Final    NO GROWTH 5 DAYS Performed at [facility], [address], [place], [place] [address]    Report Status 04/24/2019 FINAL  Final  Surgical pcr screen     Status: None   Collection Time: 04/22/19  4:06 AM   Specimen: Nasal Mucosa; Nasal Swab  Result Value Ref Range Status   MRSA, PCR NEGATIVE NEGATIVE Final   Staphylococcus aureus NEGATIVE NEGATIVE Final    Comment: (NOTE) The Xpert SA Assay (FDA approved for NASAL specimens in patients 3 years of age and older), is one component of a comprehensive surveillance program. It is not intended to diagnose infection nor to guide or monitor treatment. Performed at [facility], [address], [place], [place] [address]       Radiology Studies: No  results found.    Scheduled Meds: . sodium chloride   Intravenous Once  . apixaban  5 mg Oral Daily  . atorvastatin  10 mg Oral q1800  . Chlorhexidine Gluconate Cloth  6 each Topical Daily  . clopidogrel  75 mg Oral Q breakfast  . docusate sodium  100 mg Oral Daily  . gabapentin  100 mg Oral QHS  . insulin aspart  0-9 Units Subcutaneous TID WC  . insulin glargine  15 Units Subcutaneous QHS  . pantoprazole  40 mg Oral Daily  . pneumococcal 23 valent vaccine  0.5 mL Intramuscular Tomorrow-1000  . saccharomyces boulardii  250 mg Oral BID  . senna-docusate  2 tablet Oral BID  . sodium chloride flush  10-40 mL Intracatheter Q12H  . sodium chloride flush  3 mL Intravenous Q12H   Continuous Infusions: . sodium chloride 250 mL (04/13/19 2351)  . sodium chloride Stopped (04/13/19 2230)  . sodium chloride    . sodium chloride 100 mL/hr at 04/23/19 0951  . ceFTAROline (TEFLARO) IV 600 mg (04/29/19 4315)  . DAPTOmycin (CUBICIN)  IV 850 mg (04/28/19 2117)  . magnesium sulfate bolus IVPB       LOS: 16 days      Time spent: 35 minutes   Noralee Stain, DO [facility] 04/29/2019, 10:39 AM   Available via Epic secure chat 7am-7pm After these hours, please refer to coverage provider listed on [email or web address]

## 2019-04-29 NOTE — Progress Notes (Signed)
[facility]  Subjective  - no complaints.  States he is walking and right leg feels much better.   Objective (!) 148/94 100 98.3 F (36.8 C) (Oral) 16 99%  Intake/Output Summary (Last 24 hours) at 04/29/2019 0829 Last data filed at 04/29/2019 0600 Gross per 24 hour  Intake --  Output 900 ml  Net -900 ml    Right femoral pulse palpable Right right PT signal Dry gangrene right 1-5 toes  Laboratory Lab Results: Recent Labs    04/28/19 0500 04/29/19 0500  WBC 5.6 7.1  HGB 7.2* 7.6*  HCT 23.7* 24.7*  PLT 571* 563*   BMET Recent Labs    04/27/19 0500  NA 138  K 3.6  CL 104  CO2 25  GLUCOSE 146*  BUN 6  CREATININE 0.75  CALCIUM 8.4*    COAG Lab Results  Component Value Date   INR 1.0 04/22/2019   INR 1.3 (H) 04/13/2019   No results found for: PTT  Assessment/Planning:  44 year old male now status post right external iliac stent as well as right above-knee pop to tibioperoneal trunk bypass for critical limb ischemia with tissue loss.  He has excellent posterior tibial signal in the right ankle.  Incisions are healing.  Toes remain dry.  Would allow toes time to demarcate and see how his toes progress from my perspective. Continue aspirin and Plavix.  [name] 04/29/2019 8:29 AM --

## 2019-04-29 NOTE — Progress Notes (Signed)
PHARMACY CONSULT NOTE FOR:  OUTPATIENT  PARENTERAL ANTIBIOTIC THERAPY (OPAT)  Indication: Persistent MRSA bacteremia and vertebral osteomyelitis  Regimen: Ceftaroline 600mg  IV q8h and Daptomycin 850mg  IV q24h End date: 06/17/2019  IV antibiotic discharge orders are pended. To discharging provider:  please sign these orders via discharge navigator,  Select New Orders & click on the button choice - Manage This Unsigned Work.     Thank you for allowing pharmacy to be a part of this patient's care.  04/29/2019, 8:54 AM

## 2019-04-30 LAB — CBC
HCT: 24.1 % — ABNORMAL LOW (ref 39.0–52.0)
Hemoglobin: 7.3 g/dL — ABNORMAL LOW (ref 13.0–17.0)
MCH: 25.2 pg — ABNORMAL LOW (ref 26.0–34.0)
MCHC: 30.3 g/dL (ref 30.0–36.0)
MCV: 83.1 fL (ref 80.0–100.0)
Platelets: 555 10*3/uL — ABNORMAL HIGH (ref 150–400)
RBC: 2.9 MIL/uL — ABNORMAL LOW (ref 4.22–5.81)
RDW: 15.8 % — ABNORMAL HIGH (ref 11.5–15.5)
WBC: 7.3 10*3/uL (ref 4.0–10.5)
nRBC: 0 % (ref 0.0–0.2)

## 2019-04-30 LAB — AEROBIC/ANAEROBIC CULTURE W GRAM STAIN (SURGICAL/DEEP WOUND)

## 2019-04-30 LAB — GLUCOSE, CAPILLARY
Glucose-Capillary: 91 mg/dL (ref 70–99)
Glucose-Capillary: 145 mg/dL — ABNORMAL HIGH (ref 70–99)
Glucose-Capillary: 100 mg/dL — ABNORMAL HIGH (ref 70–99)
Glucose-Capillary: 158 mg/dL — ABNORMAL HIGH (ref 70–99)

## 2019-04-30 MED ORDER — CEFTAROLINE IV (FOR PTA / DISCHARGE USE ONLY): 600.0000 mg | Freq: Three times a day (TID) | INTRAVENOUS | 0 refills | Status: DC

## 2019-04-30 MED ORDER — HEATING PADS PADS: MEDICATED_PAD | 5 refills | Status: DC

## 2019-04-30 MED ORDER — DAPTOMYCIN IV (FOR PTA / DISCHARGE USE ONLY): 850.0000 mg | INTRAVENOUS | 0 refills | Status: DC

## 2019-04-30 MED ORDER — CLOPIDOGREL BISULFATE 75 MG PO TABS: 75.0000 mg | ORAL_TABLET | Freq: Every day | ORAL | 2 refills | Status: DC

## 2019-04-30 MED ORDER — ATORVASTATIN CALCIUM 10 MG PO TABS: 10.0000 mg | ORAL_TABLET | Freq: Every day | ORAL | 2 refills | Status: DC

## 2019-04-30 MED ORDER — TRAMADOL HCL 50 MG PO TABS: 50.0000 mg | ORAL_TABLET | Freq: Two times a day (BID) | ORAL | 0 refills | Status: AC | PRN

## 2019-04-30 MED ORDER — OXYCODONE HCL 5 MG PO TABS: 5.0000 mg | ORAL_TABLET | Freq: Four times a day (QID) | ORAL | 0 refills | Status: AC | PRN

## 2019-04-30 MED FILL — oxyCODONE HCL 5 MG TABS: 5 | 5 days supply | Qty: 40 | Fill #0

## 2019-04-30 MED FILL — traMADol HCL 50 MG TABS: 50 | 5 days supply | Qty: 10 | Fill #0

## 2019-04-30 MED FILL — CLOPIDOGREL 75 MG TABLET: 75 | 30 days supply | Qty: 30 | Fill #0

## 2019-04-30 MED FILL — ATORVASTATIN CALCIUM 10 MG: 10 | 30 days supply | Qty: 30 | Fill #0

## 2019-04-30 NOTE — TOC Progression Note (Signed)
Transition of Care (TOC) - Progression Note  Donn Pierini RN, BSN Transitions of Care Unit 4E- RN Case Manager [phone]   Patient Details  Name: Thomas [name] MRN: [id number] Date of Birth: [birth year]  Transition of Care [facility]) CM/SW Contact  Zenda Alpers, Lenn Sink, RN Phone Number: 04/30/2019, 4:33 PM  Clinical Narrative:    TOC continues to follow pt for home IV abx needs to coordinate needed outpt services- Pam with [facility] has confirmed that they can provided needed infusion needs from their pharmacy for Ceftaroline Q8h however can not provide Daptomycin. Pt will need to be set up for Daptomycin needs daily infusion. Pt is un-insured- TOC has reached out to dept. [employer] regarding patient needs for long term IV abx need until Mar. 4. Have also reached out to [facility] in Short Stay to see if pt may be able to come to [facility] Short Stay for daily Dapto infusion to complete coarse of treatment. Have also Marine scientist. Financial Counselor department is also to follow up with pt for Medicaid eligibility vs Charity options for needed treatment. Per [facility] in Short Stay pt could start outpt treatment there if everything is finalized on Tuesday Jan. 19. Pt will need Short Stay orders filled out and signed by ID- order form has been placed on Shadow chart for ID- once singed will fax back to short stay. Will also need to finalize time for short stay and see when pt's friend is able to provide transport for pt. (Short stay has an AM slot if friend can provide transport). [facility] with [facility] to provide bedside education with pt for home IV abx- short stay will provide PICC line care and draw needed weekly labs per ID orders. TOC will f/u on Monday to finalize outpt IV abx plans with Short stay.    Expected Discharge Plan: Home w Home Health Services Barriers to Discharge: Continued Medical Work up(Presents with back pain, dizziness, fall, osteomyelitis &  discitis. Osteomyelitis involves T9 & discitis T8-9, T9-10. S/p successful fluoro guided T9 vertebral body aspiration and biopsy 12/31. Necrotic right toes 1-5 plan bypass graft femoral 04-22-19)  Expected Discharge Plan and Services Expected Discharge Plan: Home w Home Health Services   Discharge Planning Services: Medication Assistance, CM Consult   Living arrangements for the past 2 months: Apartment                           HH Arranged: IV Antibiotics HH Agency: [facility] Date HH Agency Contacted: 04/26/19   Representative spoke with at [facility] Agency: Pam   Social Determinants of Health (SDOH) Interventions    Readmission Risk Interventions No flowsheet data found.

## 2019-04-30 NOTE — Progress Notes (Signed)
Physical Therapy Treatment Patient Details Name: Thomas [name] MRN: [id number] DOB: [birth year] Today's Date: 04/30/2019    History of Present Illness Patient is a 44 y/o male who presents with back pain, dizziness and fall at home. Recent long hospital stay at [facility] for disseminated MRSA bacteremia, osteomyelitis and discitis. Chest CT, abdomen and pelvis notable for findings of advanced osteomyelitis involving T9 as well as discitis T8-9, T9-10. s/p successful fluoro guided T9 vertebral body aspiration and biopsy 12/31. Positive blood cultures for MRSA. Also admitted for necrosis of right toes 1-5. s/p Right pop-TP trunk bypass with RSVG 04/22/19.    PT Comments    Patient received in bed, session conducted via tele interpreter, Dalya # [id number]. Patient agrees to PT session, reports moderate back pain. States his back pain is worse than R LE pain. Reports improvement with heating pad use. He is independent with bed mobility. Transfers with supervision and ambulating 200 feet with RW and supervision. No lob or significant difficulty noted. He reports pain but ambulates well. Patient will continue to benefit from skilled PT while here to improve strength and activity tolerance for return home.       Follow Up Recommendations  Home health PT;Supervision - Intermittent     Equipment Recommendations  None recommended by PT    Recommendations for Other Services       Precautions / Restrictions Precautions Precautions: Fall Precaution Comments: R LE incisions, declined brace- says it did not help his pain Restrictions Weight Bearing Restrictions: No    Mobility  Bed Mobility Overal bed mobility: Independent Bed Mobility: Supine to Sit;Sit to Supine   Sidelying to sit: HOB elevated;Independent Supine to sit: HOB elevated;Independent Sit to supine: Independent   General bed mobility comments: use of rails  Transfers Overall transfer level: Needs assistance Equipment  used: Rolling walker (2 wheeled) Transfers: Sit to/from Stand Sit to Stand: Supervision            Ambulation/Gait Ambulation/Gait assistance: Supervision Gait Distance (Feet): 200 Feet Assistive device: Rolling walker (2 wheeled) Gait Pattern/deviations: Step-through pattern;Decreased stride length Gait velocity: decreased   General Gait Details: supervision for safety   Stairs             Wheelchair Mobility    Modified Rankin (Stroke Patients Only)       Balance Overall balance assessment: Needs assistance;History of Falls Sitting-balance support: Feet supported Sitting balance-Leahy Scale: Good Sitting balance - Comments: supervision   Standing balance support: Bilateral upper extremity supported;During functional activity Standing balance-Leahy Scale: Fair Standing balance comment: reliant on BUE support for amb                            Cognition Arousal/Alertness: Awake/alert Behavior During Therapy: WFL for tasks assessed/performed Overall Cognitive Status: Within Functional Limits for tasks assessed                                 General Comments: video interpreter used throughout session      Exercises      General Comments        Pertinent Vitals/Pain Pain Assessment: 0-10 Pain Score: 5  Pain Location: back Pain Intervention(s): Monitored during session    Home Living                      Prior Function  PT Goals (current goals can now be found in the care plan section) Acute Rehab PT Goals Patient Stated Goal: to walk more so he can go home PT Goal Formulation: With patient Time For Goal Achievement: 05/03/19 Potential to Achieve Goals: Good Progress towards PT goals: Progressing toward goals    Frequency    Min 3X/week      PT Plan Current plan remains appropriate    Co-evaluation              AM-PAC PT "6 Clicks" Mobility   Outcome Measure  Help needed  turning from your back to your side while in a flat bed without using bedrails?: None Help needed moving from lying on your back to sitting on the side of a flat bed without using bedrails?: None Help needed moving to and from a bed to a chair (including a wheelchair)?: A Little Help needed standing up from a chair using your arms (e.g., wheelchair or bedside chair)?: A Little Help needed to walk in hospital room?: A Little Help needed climbing 3-5 steps with a railing? : A Little 6 Click Score: 20    End of Session Equipment Utilized During Treatment: Gait belt Activity Tolerance: Patient tolerated treatment well Patient left: in bed;with call bell/phone within reach;with bed alarm set Nurse Communication: Mobility status;Patient requests pain meds PT Visit Diagnosis: Pain;Muscle weakness (generalized) (M62.81) Pain - Right/Left: Right Pain - part of body: Leg(back)     Time: 1335-1400 PT Time Calculation (min) (ACUTE ONLY): 25 min  Charges:  $Gait Training: 23-37 mins                     Cecilee Rosner, PT, GCS 04/30/19,2:15 PM

## 2019-04-30 NOTE — Progress Notes (Signed)
Pharmacy Antibiotic Note  Assessment  Thomas [name] is a 44 year old male who presented to [facility] with worsening back pain, generalized weakness, fever and recent fall. He was recently hospitalized at [facility] from 10/18-11/5 for disseminated MRSA bacteremia. 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 He was persistently bacteremic with MRSA until 11/8. At that point his regimen was switched from vancomycin to ceftaroline and daptomycin. He underwent debridements of his right leg which grew MRSA as well. He was discharged on 11/24 on dapotmycin to complete therapy through 12/20.   After finishing therapy in late December he began to have increasing back pain and darkening of his toes on his right foot which led to his readmission here on 12/29. His blood cultures were positive for MRSA and MRI of his spine showed progressive infection at T9 and new discitis at T8 and T10. MRI of his right foot showed concern for possible osteomyelitis in his distal right second toe. Fortunately TEE did not show any vegetations.   He was originally placed on vancomycin, but the infectious disease team did not think this would be adaquete to treat his persistent and extensively disseminated MRSA infection. We have decided to treat him with an extended 8 week course of daptomycin and ceftaroline. We have sent his bone culture to [facility] for daptomycin susceptibility testing and are still awaiting the results.   Despite his difficult financial situation and lack of transportation we have arranged for him to receive ceftaroline at home through [facility] at no cost to the patient, where he will administer the medication himself. For his daptomycin he will need daily infusions that he can receive at [facility]. Luckily he has a friend who is willing to bring him to these daily infusions.   Plan  Ceftaroline through [facility]  Daptomycin daily through Short Stay  Continue through 06/17/2019 Follow up with Dr. Orvan Falconer at [facility] 05/26/2019  Please call with any questions or concerns.  Jettie Pagan, PharmD PGY2 Infectious Disease Pharmacy Resident  [phone]

## 2019-04-30 NOTE — TOC Initial Note (Signed)
Transition of Care (TOC) - Initial/Assessment Note  Donn Pierini RN, BSN Transitions of Care Unit 4E- RN Case Manager [phone]   Patient Details  Name: Thomas [name] MRN: [id number] Date of Birth: [birth year]  Transition of Care [facility]) CM/SW Contact:    Darrold Span, RN Phone Number: 04/30/2019, 2:39 PM  Clinical Narrative:                 Pt will need home IV abx, have been working with Elita Quick at [facility] for transition needs for home IV abx as pt is uninsured and on two costly abx for 8 weeks (end date Mar. 4). Pam has spoken with pt and is working with pt for Ceftaroline Q8h needs under [facility]. However pt still needs to be set up for Daptomycin needs dosed daily. CM has reached out to [facility] center- spoke with Laverne. Laverne needs to speak with her director due to length of abx course of treatment and cost. Await to hear back from her, also have FC following up with pt. Will need to keep pt in house until a final plan is confirmed for Daptomycin. Per Laverne with Short Stay first available time that they would potentially get pt into do abx with them would be Tues. Of next week once everything has been confirmed. TOC will continue to follow up on Dapto plans- MD has been updated. Pt aware that he needs to stay in house for time being.   Expected Discharge Plan: Home w Home Health Services Barriers to Discharge: Continued Medical Work up(Presents with back pain, dizziness, fall, osteomyelitis & discitis. Osteomyelitis involves T9 & discitis T8-9, T9-10. S/p successful fluoro guided T9 vertebral body aspiration and biopsy 12/31. Necrotic right toes 1-5 plan bypass graft femoral 04-22-19)   Patient Goals and CMS Choice Patient states their goals for this hospitalization and ongoing recovery are:: return home   Choice offered to / list presented to : NA  Expected Discharge Plan and Services Expected Discharge Plan: Home w Home Health  Services   Discharge Planning Services: Medication Assistance, CM Consult   Living arrangements for the past 2 months: Apartment                           HH Arranged: IV Antibiotics HH Agency: [facility] Date HH Agency Contacted: 04/26/19   Representative spoke with at [facility] Agency: Pam  Prior Living Arrangements/Services Living arrangements for the past 2 months: Apartment Lives with:: Self Patient language and need for interpreter reviewed:: Yes(Arabic) Do you feel safe going back to the place where you live?: Yes      Need for Family Participation in Patient Care: Yes (Comment) Care giver support system in place?: Yes (comment)   Criminal Activity/Legal Involvement Pertinent to Current Situation/Hospitalization: No - Comment as needed  Activities of Daily Living Home Assistive Devices/Equipment: None ADL Screening (condition at time of admission) Patient's cognitive ability adequate to safely complete daily activities?: Yes Is the patient deaf or have difficulty hearing?: No Does the patient have difficulty seeing, even when wearing glasses/contacts?: No Does the patient have difficulty concentrating, remembering, or making decisions?: No Patient able to express need for assistance with ADLs?: Yes Does the patient have difficulty dressing or bathing?: No Independently performs ADLs?: Yes (appropriate for developmental age) Does the patient have difficulty walking or climbing stairs?: No Weakness of Legs: None Weakness of Arms/Hands: None  Permission Sought/Granted Permission sought  to share information with : Case Manager, Chartered certified accountant granted to share information with : Yes, Verbal Permission Granted     Permission granted to share info w AGENCY: HH/Infusion        Emotional Assessment Appearance:: Appears stated age Attitude/Demeanor/Rapport: Engaged Affect (typically observed): Appropriate Orientation: :  Oriented to Self, Oriented to Place, Oriented to  Time, Oriented to Situation   Psych Involvement: No (comment)  Admission diagnosis:  Foul smelling urine [R82.90] Discitis of thoracolumbar region [M46.45] Osteomyelitis of vertebra, multiple sites in spine ([place]) [M46.20] Acute right-sided thoracic back pain [M54.6] Sepsis, due to unspecified organism, unspecified whether acute organ dysfunction present [facility]) [A41.9] Patient Active Problem List   Diagnosis Date Noted  . Discitis of thoracolumbar region   . MRSA bacteremia   . Endocarditis of mitral valve   . Rectal mass 04/14/2019  . DVT of popliteal vein ([place]) 04/14/2019  . Gangrene of toe of right foot ([place]) 04/14/2019  . Osteomyelitis of spine ([place]) 04/13/2019  . Diabetes mellitus type II, uncontrolled ([place]) 01/27/2015  . Diabetic neuropathy ([place]) 01/27/2015  . HTN (hypertension) 01/27/2015  . Depression 01/27/2015  . Hyperlipidemia 01/27/2015   PCP:  Mackie Pai, PA-C Pharmacy:   [facility] [place], [address] [place] [address] Phone: [phone] Fax: [phone]  [place], [address] [place] [address] Phone: [phone] Fax: [phone]  [place], [place] [address] [place] [address] Phone: [phone] Fax: [phone]  [facility] of [place], [place] - [address] [place] [address] Phone: [phone] Fax: [phone]     Social Determinants of Health (SDOH) Interventions    Readmission Risk Interventions No flowsheet data found.

## 2019-04-30 NOTE — Progress Notes (Signed)
    [facility]          BRIEF PROGRESS NOTE:  Received notification that [name] will be able to obtain his Daptomycin from Short [facility] and [facility] will be providing Ceftaroline at home. OPAT orders placed yesterday and will plan to complete antibiotic therapy on 06/17/19 with follow up in the ID office. Remains okay for discharge from ID perspective.    Thomas Eke, NP [facility] [phone] Pager  04/30/2019  10:16 AM

## 2019-04-30 NOTE — Discharge Summary (Addendum)
Physician Discharge Summary  Thomas [name] OBS:[id number] DOB: 09/05/1975 DOA: 04/13/2019  PCP: Mackie Pai, PA-C  Admit date: 04/13/2019 Discharge date: 05/03/2019  Admitted From: Home Disposition:  Home with home health  Recommendations for Outpatient Follow-up:  1. Follow up with PCP in 1 week 2. Follow up with Dr. Carlis Abbott, vascular surgery, on 05/18/2019  3. Follow up with Dr. Megan Salon, Infectious Disease, on 05/26/2019  4. Complete IV antibiotics as ordered through 06/17/2019  5. Please follow up incidental finding on CT A/P: Focal circumferential wall thickening of the rectum is noted concerning for inflammation or possibly malignancy. Sigmoidoscopy is recommended for further evaluation.  Discharge Condition: Stable CODE STATUS: Full  Diet recommendation: Carb modified   Brief/Interim Summary: Thomas [name] is a 66 y.o.malewith medical history significant ofDM2 poorly controlled.Recent hospitalization at [facility] for disseminated MRSA bacteremia, osteomyelitis and discitis. Completed 6 weeks course of IV antibiotics on April 04, 2019. Source of bacteremia thought to be from skin source (per previous facility report, area on toe that appeared cellulitic, area in R back that was erythematous and an abrasion of the lateral R aspect of his shin, abscess formation in the R calf and pyomyositis of the L vastus intermedius).Developed necrosis of his R 1-5 digits sincehisdischarge.  Presentedherewith worsening back pain, generalized weakness, fever and recent fall. CT chest, abdomen and pelvis notable for findings of advanced osteomyelitis involving T9 as well as discitis.Positive blood cultures for MRSA. Seen by infectious disease,recommendations for IV Vancomycin. Pt underwent TEE on 04/19/2019 which did not demonstrate vegetations or other evidence of persistent endocarditis.   He was also taken to the vascular lab by Dr. Carlis Abbott for Aortogram, bilateral  lower extremity arteriogram with runoff, right external iliac artery angioplasty with stent placement under moderate conscious sedation. This was performed for critical ischemia of the right lower extremity with gangrene of digits 1-5 of the right foot. Dr. Carlis Abbott performed a right SFA to TP trunk bypass for popliteal occlusion in addition to iliac lesion on 04/22/2019. The patient apparently developed a thigh hematoma and received transfusion of 2 units PRBC.  Neurosurgery was reconsulted on 04/24/2019 for re-evaluation of thoracic spine discitis due to concern for persistent infection in this area. I appreciate neurosurgery's assistance. Dr. Kathyrn Sheriff has continued to recommend medical management of the patient's T8-T-10 osteomyelitis/diskitis. The patient is a poor surgical candidate. TLSO brace has been ordered.   He is recommended to complete ceftaroline, daptomycin for total 8 weeks and to follow-up with infectious disease as an outpatient.  Discharge Diagnoses:  Principal Problem:   MRSA bacteremia Active Problems:   Diabetes mellitus type II, uncontrolled ([place])   HTN (hypertension)   Osteomyelitis of spine ([facility])   Rectal mass   DVT of popliteal vein (HCC)   Gangrene of toe of right foot (HCC)   Discitis of thoracolumbar region   Endocarditis of mitral valve   MRSA bacteremia and recurrent thoracic spine discitis/vertebral osteomyelitis -Blood culture drawn on 04/13/2019 +MRSA -MRI revealed progressive T9 osteomyelitis with increased cultures evaluation and vertebral body collapse. New discitis at T8-9 and T9-T10 with T8 and T10 osteomyelitis. Moderate to severe spinal stenosis at T8-T9. Persistent extensive anterior paravertebral soft tissue inflammation/phlegmon. Decreased posterior paraspinal inflammationwithout residual or new abscess. -Status post aspiration of T9 abscess by IR on 04/15/2019.  Deep tissue culture growing staph aureus -Status post TEE 04/18/2018 and demonstrated no  vegetations or evidence of persistent endocarditis -Neurosurgery, Dr. Kathyrn Sheriff, has continued to recommend medical management of patient's T8-T-10 osteomyelitis/diskitis. The  patient is a poor surgical candidate. Surgical approach will be withheld pending neurologic decline related to stenosis or subacute/chronic development of significant kyphotic deformity requiring stabilization. TLSO brace has been ordered -Repeat blood culture 04/19/2019 negative -Infectious disease following.  Continue IV Cetaroline and Daptomycin for a total of 8 weeks. Last day of therapy will be 06/16/2019  Peripheral artery disease -Right first through fifth toes dry gangrene.  Status post bilateral arteriogram with bilateral runoff and stent placement in right EIA. The patient returned to vascular lab on 04/22/2019 forright SFA to TP trunk bypass for popliteal occlusion in addition to iliac lesion -Continue Plavix -Follow-up with vascular surgery in 2 to 3 weeks  History of DVT -Resume Eliquis  Essential hypertension -Resume HCTZ, lisinopril  Hyperlipidemia -Continue Lipitor  Possible rectal mass -Will needGI evaluation when more stable and bacteremia has cleared  Type 2 diabetes with hyperglycemia and diabetic neuropathy -Hemoglobin A1c8.3 on 04/14/2019 -Continue Lantus, sliding scale insulin -Resume gabapentin    Discharge Instructions  Discharge Instructions    Call MD for:  difficulty breathing, headache or visual disturbances   Complete by: As directed    Call MD for:  extreme fatigue   Complete by: As directed    Call MD for:  persistant dizziness or light-headedness   Complete by: As directed    Call MD for:  persistant nausea and vomiting   Complete by: As directed    Call MD for:  redness, tenderness, or signs of infection (pain, swelling, redness, odor or green/yellow discharge around incision site)   Complete by: As directed    Call MD for:  severe uncontrolled pain   Complete by:  As directed    Call MD for:  temperature >100.4   Complete by: As directed    Diet Carb Modified   Complete by: As directed    Discharge instructions   Complete by: As directed    You were cared for by a hospitalist during your hospital stay. If you have any questions about your discharge medications or the care you received while you were in the hospital after you are discharged, you can call the unit and ask to speak with the hospitalist on call if the hospitalist that took care of you is not available. Once you are discharged, your primary care physician will handle any further medical issues. Please note that NO REFILLS for any discharge medications will be authorized once you are discharged, as it is imperative that you return to your primary care physician (or establish a relationship with a primary care physician if you do not have one) for your aftercare needs so that they can reassess your need for medications and monitor your lab values.   Home infusion instructions   Complete by: As directed    Instructions: Flushing of vascular access device: 0.9% NaCl pre/post medication administration and prn patency; Heparin 100 u/ml, 74m for implanted ports and Heparin 10u/ml, 583mfor all other central venous catheters.   Increase activity slowly   Complete by: As directed      Allergies as of 04/30/2019   No Known Allergies     Medication List    STOP taking these medications   lovastatin 40 MG tablet Commonly known as: MEVACOR   oxyCODONE-acetaminophen 5-325 MG tablet Commonly known as: PERCOCET/ROXICET     TAKE these medications   apixaban 5 MG Tabs tablet Commonly known as: ELIQUIS Take 1 tablet (5 mg total) by mouth daily.   atorvastatin 10 MG tablet  Commonly known as: LIPITOR Take 1 tablet (10 mg total) by mouth daily at 6 PM.   ceftaroline  IVPB Commonly known as: TEFLARO Inject 600 mg into the vein every 8 (eight) hours. Indication:  Persistent MRSA bacteremia and  vertebral osteomyelitis  Last Day of Therapy:  06/17/2019 Labs - Once weekly:  CBC/D and BMP, Labs - Every other week:  ESR and CRP   clopidogrel 75 MG tablet Commonly known as: PLAVIX Take 1 tablet (75 mg total) by mouth daily with breakfast.   daptomycin  IVPB Commonly known as: CUBICIN Inject 850 mg into the vein daily. Indication:  Persistent MRSA bacteremia and vertebral osteomyelitis  Last Day of Therapy:  06/17/2019 Labs - Once weekly:  CBC/D, BMP, and CPK Labs - Every other week:  ESR and CRP   gabapentin 100 MG capsule Commonly known as: NEURONTIN TAKE ONE CAPSULE BY MOUTH AT BEDTIME   Heating Pads Pads Use over the counter heating pads as needed for back pain   hydrochlorothiazide 25 MG tablet Commonly known as: HYDRODIURIL TAKE ONE TABLET BY MOUTH DAILY   Lantus SoloStar 100 UNIT/ML Solostar Pen Generic drug: Insulin Glargine 31 units into skin q hs What changed:   how much to take  how to take this  when to take this  additional instructions   lisinopril 20 MG tablet Commonly known as: ZESTRIL TAKE ONE TABLET BY MOUTH DAILY   metFORMIN 500 MG 24 hr tablet Commonly known as: GLUCOPHAGE-XR TAKE TWO TABLETS BY MOUTH TWICE A DAY   oxyCODONE 5 MG immediate release tablet Commonly known as: Oxy IR/ROXICODONE Take 1-2 tablets (5-10 mg total) by mouth every 6 (six) hours as needed for up to 5 days for moderate pain or severe pain.   traMADol 50 MG tablet Commonly known as: ULTRAM Take 1 tablet (50 mg total) by mouth every 12 (twelve) hours as needed for up to 5 days for severe pain.            Home Infusion Instuctions  (From admission, onward)         Start     Ordered   04/30/19 0000  Home infusion instructions    Question:  Instructions  Answer:  Flushing of vascular access device: 0.9% NaCl pre/post medication administration and prn patency; Heparin 100 u/ml, 34m for implanted ports and Heparin 10u/ml, 562mfor all other central venous catheters.    04/30/19 1042         Follow-up Information    ClMarty HeckMD Follow up on 05/18/2019.   Specialty: Vascular Surgery Contact information: [phone]        CaMichel BickersMD Follow up.   Specialty: Infectious Diseases Why: 10:30am on 2/10. Please call to reschedule if you are unable to make this appointment.  Contact information: [phone]        Saguier, EdPercell MillerPA-C. Schedule an appointment as soon as possible for a visit in 1 week(s).   Specialties: Internal Medicine, Family Medicine Contact information: [phone]        No Known Allergies  Consultations:  Vascular surgery  Interventional radiology  Infectious disease  Wound Care  Neurosurgery   Procedures/Studies: CT Angio Chest PE W and/or Wo Contrast  Result Date: 04/13/2019 CLINICAL DATA:  Sepsis. EXAM: CT ANGIOGRAPHY CHEST CT ABDOMEN AND PELVIS WITH CONTRAST TECHNIQUE: Multidetector CT  imaging of the chest was performed using the standard protocol during bolus administration of intravenous contrast. Multiplanar CT image reconstructions and MIPs were obtained to evaluate the vascular anatomy. Multidetector CT imaging of the abdomen and pelvis was performed using the standard protocol during bolus administration of intravenous contrast. CONTRAST:  178m OMNIPAQUE IOHEXOL 350 MG/ML SOLN COMPARISON:  January 31, 2019. FINDINGS: CTA CHEST FINDINGS Cardiovascular: Satisfactory opacification of the pulmonary arteries to the segmental level. No evidence of pulmonary embolism. Normal heart size. No pericardial effusion. Mediastinum/Nodes: No enlarged mediastinal, hilar, or axillary lymph nodes. Thyroid gland, trachea, and esophagus demonstrate no significant findings. Lungs/Pleura: No pneumothorax is noted. Minimal left pleural effusion is noted with  adjacent left subsegmental atelectasis. Right lung is unremarkable. Musculoskeletal: There is noted nearly complete lytic destruction of the T9 vertebral body with adjacent lytic destruction of the inferior endplate of T8 and superior endplate of T 10. These findings are most consistent with a combination of diskitis and osteomyelitis at the T8-9 and T10 levels. MRI is recommended for further evaluation. Review of the MIP images confirms the above findings. CT ABDOMEN and PELVIS FINDINGS Hepatobiliary: No focal liver abnormality is seen. No gallstones, gallbladder wall thickening, or biliary dilatation. Pancreas: Unremarkable. No pancreatic ductal dilatation or surrounding inflammatory changes. Spleen: Normal in size without focal abnormality. Adrenals/Urinary Tract: Adrenal glands are unremarkable. Kidneys are normal, without renal calculi, focal lesion, or hydronephrosis. Bladder is unremarkable. Stomach/Bowel: The stomach appears normal. There is no evidence of abnormal bowel dilatation. The appendix is unremarkable. However, there is seen focal circumferential wall thickening of the rectum concerning for inflammation or possibly malignancy. Sigmoidoscopy is recommended for further evaluation. Vascular/Lymphatic: Aortic atherosclerosis. No enlarged abdominal or pelvic lymph nodes. Reproductive: Prostate is unremarkable. Other: No abdominal wall hernia or abnormality. No abdominopelvic ascites. Musculoskeletal: No acute or significant osseous findings. Review of the MIP images confirms the above findings. IMPRESSION: Findings consistent with osteomyelitis and discitis at T8-9 and T9-10, with nearly complete lytic destruction of T9 vertebral body secondary to osteomyelitis. Further evaluation with MRI is recommended. Focal circumferential wall thickening of the rectum is noted concerning for inflammation or possibly malignancy. Sigmoidoscopy is recommended for further evaluation. Minimal left pleural effusion is  noted with adjacent subsegmental atelectasis. No definite evidence of pulmonary embolus. Aortic Atherosclerosis (ICD10-I70.0). Electronically Signed   By: JMarijo ConceptionM.D.   On: 04/13/2019 16:28   MR THORACIC SPINE W WO CONTRAST  Result Date: 04/24/2019 CLINICAL DATA:  Discitis/osteomyelitis, follow-up EXAM: MRI THORACIC WITHOUT AND WITH CONTRAST TECHNIQUE: Multiplanar and multiecho pulse sequences of the thoracic spine were obtained without and with intravenous contrast. CONTRAST:  893mGADAVIST GADOBUTROL 1 MMOL/ML IV SOLN COMPARISON:  04/14/2019 FINDINGS: MRI THORACIC SPINE FINDINGS Alignment:  Stable. Vertebrae: Persistent abnormal signal and enhancement and T8-T10 and intervening disc spaces. Pathologic fracture of T9 appears similar with endplate retropulsion. Hemangiomas are again identified within the T7, T10, and T12 vertebral bodies. There is persistent diffusely T1 hypointense marrow signal. Cord:  No definite abnormal cord signal. Paraspinal and other soft tissues: Persistent paravertebral inflammatory edema/enhancement extending from approximately T6-T7 to T11-T12. There is persistent dorsal epidural enhancement again extending also from approximately T6-T7 to T11-T12 levels. Disc levels: As before, ventral greater than dorsal epidural inflammatory changes at T8-T9 and T9 levels with superimposed T8-T9 degenerative disc disease and endplate retropulsion results in moderate to severe canal stenosis with flattening of the ventral cord. Degree of stenosis may be minimally improved. Persistent involvement of left greater than  right T8-T9 and T9-T10 neural foramina. 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 Similar extent of ventral and dorsal epidural inflammatory change with stable to slightly decreased moderate to severe spinal stenosis at T8-T9. Electronically Signed   By: Macy Mis M.D.   On: 04/24/2019 14:36   MR THORACIC  SPINE W WO CONTRAST  Result Date: 04/14/2019 CLINICAL DATA:  Thoracic osteomyelitis. EXAM: MRI THORACIC WITHOUT AND WITH CONTRAST TECHNIQUE: Multiplanar and multiecho pulse sequences of the thoracic spine were obtained without and with intravenous contrast. CONTRAST:  8.34m GADAVIST GADOBUTROL 1 MMOL/ML IV SOLN COMPARISON:  02/20/2019 thoracic spine MRI. 04/13/2019 chest, abdomen, and pelvis CT. FINDINGS: Alignment:  Mildly increased kyphosis centered at T9. No listhesis. Vertebrae: Persistent abnormal marrow edema and enhancement in the T9 vertebral body with progressive osseous erosion and pathologic fracture likely extending into the pedicles and progressive moderate to severe anterior vertebral body height loss compared to the prior MRI. There is now edema and enhancement in the T8-9 and T9-10 disc spaces with erosion of the T8 inferior and T10 superior endplate, new from the prior MRI. Persistent diffusely diminished bone marrow T1 signal intensity throughout the thoracic spine. Hemangiomas in the T7 and T10 vertebral bodies. Cord:  Normal cord signal. Paraspinal and other soft tissues: There is persistent extensive anterior paravertebral soft tissue inflammation/phlegmon bilaterally. Posterior paraspinal muscle inflammation has decreased, and no residual or new paraspinal fluid collection is identified. Dorsal epidural enhancement extends from T6-7 to T11-12, improved from the previous MRI where it extended superiorly to the T3 level. Trace right and small left pleural effusions are much smaller than on the prior MRI. Disc levels: Ventral and dorsal epidural inflammation at the T8-9 disc space and T9 vertebral body levels result in increased, moderate to severe spinal stenosis with moderate cord flattening. Moderate to severe bilateral neural foraminal stenosis is again seen at T8-9 and T9-10. A small central disc protrusion at T7-8 is unchanged and does not result in stenosis. IMPRESSION: 1. Progressive T9  osteomyelitis with increased osseous erosion and vertebral body collapse/pathologic compression fracture since 02/20/2019. 2. New discitis at T8-9 and T9-10 with T8 and T10 osteomyelitis. 3. Overall decreased extent of dorsal epidural inflammation since 02/20/2019 but with increased, moderate to severe spinal stenosis at T8-9. 4. Persistent extensive anterior paravertebral soft tissue inflammation/phlegmon. Decreased posterior paraspinal inflammation without residual or new abscess. Electronically Signed   By: ALogan BoresM.D.   On: 04/14/2019 22:10   CT ABDOMEN PELVIS W CONTRAST  Result Date: 04/13/2019 CLINICAL DATA:  Sepsis. EXAM: CT ANGIOGRAPHY CHEST CT ABDOMEN AND PELVIS WITH CONTRAST TECHNIQUE: Multidetector CT imaging of the chest was performed using the standard protocol during bolus administration of intravenous contrast. Multiplanar CT image reconstructions and MIPs were obtained to evaluate the vascular anatomy. Multidetector CT imaging of the abdomen and pelvis was performed using the standard protocol during bolus administration of intravenous contrast. CONTRAST:  1087mOMNIPAQUE IOHEXOL 350 MG/ML SOLN COMPARISON:  January 31, 2019. FINDINGS: CTA CHEST FINDINGS Cardiovascular: Satisfactory opacification of the pulmonary arteries to the segmental level. No evidence of pulmonary embolism. Normal heart size. No pericardial effusion. Mediastinum/Nodes: No enlarged mediastinal, hilar, or axillary lymph nodes. Thyroid gland, trachea, and esophagus demonstrate no significant findings. Lungs/Pleura: No pneumothorax is noted. Minimal left pleural effusion is noted with adjacent left subsegmental atelectasis. Right lung is unremarkable. Musculoskeletal: There is noted nearly complete lytic destruction of the T9 vertebral body with adjacent lytic destruction of the inferior endplate  of T8 and superior endplate of T 10. These findings are most consistent with a combination of diskitis and osteomyelitis at the  T8-9 and T10 levels. MRI is recommended for further evaluation. Review of the MIP images confirms the above findings. CT ABDOMEN and PELVIS FINDINGS Hepatobiliary: No focal liver abnormality is seen. No gallstones, gallbladder wall thickening, or biliary dilatation. Pancreas: Unremarkable. No pancreatic ductal dilatation or surrounding inflammatory changes. Spleen: Normal in size without focal abnormality. Adrenals/Urinary Tract: Adrenal glands are unremarkable. Kidneys are normal, without renal calculi, focal lesion, or hydronephrosis. Bladder is unremarkable. Stomach/Bowel: The stomach appears normal. There is no evidence of abnormal bowel dilatation. The appendix is unremarkable. However, there is seen focal circumferential wall thickening of the rectum concerning for inflammation or possibly malignancy. Sigmoidoscopy is recommended for further evaluation. Vascular/Lymphatic: Aortic atherosclerosis. No enlarged abdominal or pelvic lymph nodes. Reproductive: Prostate is unremarkable. Other: No abdominal wall hernia or abnormality. No abdominopelvic ascites. Musculoskeletal: No acute or significant osseous findings. Review of the MIP images confirms the above findings. IMPRESSION: Findings consistent with osteomyelitis and discitis at T8-9 and T9-10, with nearly complete lytic destruction of T9 vertebral body secondary to osteomyelitis. Further evaluation with MRI is recommended. Focal circumferential wall thickening of the rectum is noted concerning for inflammation or possibly malignancy. Sigmoidoscopy is recommended for further evaluation. Minimal left pleural effusion is noted with adjacent subsegmental atelectasis. No definite evidence of pulmonary embolus. Aortic Atherosclerosis (ICD10-I70.0). Electronically Signed   By: Marijo Conception M.D.   On: 04/13/2019 16:28   MR FOOT RIGHT W WO CONTRAST  Result Date: 04/16/2019 CLINICAL DATA:  Sepsis, peripheral vascular disease, skin changes of the toes EXAM: MRI  OF THE RIGHT FOREFOOT WITHOUT AND WITH CONTRAST TECHNIQUE: Multiplanar, multisequence MR imaging of the right forefoot was performed before and after the administration of intravenous contrast. CONTRAST:  43m GADAVIST GADOBUTROL 1 MMOL/ML IV SOLN COMPARISON:  Right foot x-ray 03/19/2019 FINDINGS: Bones/Joint/Cartilage There is bone marrow edema and enhancement within the distal phalanxes of the second and third digits (series 8, images 9 and 13). Soft tissue defect over the the distal aspect of the second toe distal phalanx with intermediate T1 signal, suspicious for osteomyelitis. Preserved T1 marrow signal within the third digit distal phalanx. The remaining osseous structures of the forefoot maintain a normal fatty T1 marrow signal. No acute fractures. No dislocation. No joint effusions. Ligaments Intrinsic foot ligaments including the Lisfranc ligament are intact. Muscles and Tendons Diffuse intramuscular edema within the intrinsic forefoot musculature suggesting denervation changes versus myositis. No intramuscular fluid collection. Tendons are intact. No tenosynovial fluid collection. Soft tissues Soft tissue defect distal to the second toe distal phalanx. No soft tissue fluid collections. IMPRESSION: 1. Soft tissue defect over the distal aspect of the second toe distal phalanx with underlying bone marrow edema and enhancement, suspicious for osteomyelitis. 2. Bone marrow edema and enhancement within the third toe distal phalanx with preserved T1 marrow signal suggesting a reactive osteitis. 3. Diffuse intramuscular edema within the intrinsic forefoot musculature suggesting denervation changes versus myositis. Electronically Signed   By: NDavina PokeD.O.   On: 04/16/2019 13:23   PERIPHERAL VASCULAR CATHETERIZATION  Result Date: 04/19/2019 Patient name: Thomas [name]     MRN: [id number]       DOB: [birth year]         Sex: male  04/19/2019 Pre-operative Diagnosis: Critical limb ischemia of the right  lower extremity with tissue loss (dry gangrene of right toes 1-5) Post-operative  diagnosis:  Same Surgeon:  Marty Heck, MD Procedure Performed: 1.  Ultrasound-guided access of the left common femoral artery 2.  Aortogram 3.  Bilateral lower extremity arteriogram with runoff 4.  Right external iliac artery angioplasty with stent placement (8 mm x 29 mm balloon expandable bare-metal stent, Abbott Omnilink) 5.  Mynx closure of the left common femoral artery 6.  80 minutes of monitored moderate conscious sedation time  Indications: Patient is a 44 year old male with complex medical history including prolonged hospital admission for MRSA bacteremia including history of endocarditis and now discitis.  Ultimately vascular surgery was consulted for dry gangrene of the right lower extremity toes 1 through 5.  He presents today for aortogram, lower extremity arteriogram, possible intervention after risk and benefits were discussed.  Preoperative noninvasive imaging suggested monophasic runoff at the right lower extremity ankle.  Findings:  Aortogram showed patent single renal arteries bilaterally.  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  There was no other flow-limiting lesions in the aortoiliac segment.  Both hypogastric arteries are patent.  Right lower extremity arteriogram shows a patent common femoral, profunda, and SFA as well as above-knee popliteal artery.  Patient occludes his below-knee popliteal artery and reconstitutes a tibial peroneal trunk with two-vessel runoff via the posterior tibial and peroneal artery.  Left lower extremity arteriogram shows a patent common femoral and profunda.  He has a patent SFA with a 50% stenosis short segment in the mid portion of the SFA.  The above and below-knee popliteal arteries are patent.  Appears to have an occluded tibioperoneal trunk with reconstitution  of peroneal and posterior tibial artery.  Tibial runoff is difficult to evaluate on the left due to contrast timing.  The right external iliac high-grade stenosis was treated with a 8 mm x 29 mm balloon expandable bare-metal stent.  There was poststenotic dilation here in the external iliac artery distal to the lesion.  There is no evidence of residual stenosis.  He has a much better right femoral pulse.             Procedure:  The patient was identified in the holding area and taken to room 8.  The patient was then placed supine on the table and prepped and draped in the usual sterile fashion.  A time out was called.  Ultrasound was used to evaluate the left common femoral artery.  It was patent .  A digital ultrasound image was acquired.  A micropuncture needle was used to access the left common femoral artery under ultrasound guidance.  An 018 wire was advanced without resistance and a micropuncture sheath was placed.  The 018 wire was removed and a benson wire was placed.  The micropuncture sheath was exchanged for a 5 french sheath.  An omniflush catheter was advanced over the wire to the level of L-1.  An abdominal angiogram was obtained.  Next catheter was pulled down and bilateral lower extremity runoff was obtained.  Runoff was fairly brisk on the right lower extremity which was the side of interest.  It was difficult to evaluate tibial runoff due to timing.  Subsequently used a Omni Flush catheter with a soft angled Glidewire and selected the right iliac and ultimately passed our catheter down the right SFA.  We got some staged imaging of the below-knee popliteal artery and runoff in the right lower extremity.  Pertinent findings are noted  above but will need to be considered for SFA to tibioperoneal trunk bypass.  On initial aortogram imaging, I thought we saw a high-grade lesion in the right external iliac artery.  We subsequently got more dedicated imaging with steep left oblique and this confirmed a  proximal right external iliac artery high-grade stenosis.  Ultimately this lesion was crossed again with a soft angled Glidewire and we advanced our Omni down into the right SFA.  Exchanged for a Rosen wire for more support.  Patient was given 100 units/kg heparin.  A long 6 [place] Ansell sheath was then advanced in the left groin up over the aortic bifurcation and placed into the proximal right common iliac.  We got hand-injection to identify the lesion.  Ultimately selected a balloon expandable bare-metal stent.  Ultimately a 8 mm x 29 mm Abbott Omnilink stent was deployed across the lesion to nominal pressure.  The delivery device was removed.  Another hand-injection showed there was no residual stenosis with excellent flow down the right external iliac.  I think he had post stenotic dilation here and I did not want to be more aggressive with stent placement.  At that point in time exchanged for a short 6 French sheath in the left common femoral artery.  A mynx closure device was deployed.  Tolerated the procedure without any apparent complications.   Plan: Patient will need to be considered for a right SFA to tibioperoneal trunk bypass after right external iliac stent today.  Plavix loaded in cath lab.  Vein mapping has been ordered.  Marty Heck, MD [facility] Office: [phone]   IR Fluoro Guide Ndl Plmt / BX  Result Date: 04/20/2019 INDICATION: Concern for discitis/osteomyelitis involving the T9 vertebral body as well as the adjacent T8 and T9 intervertebral disc spaces. Please perform image guided aspiration for tissue diagnostic purposes.  EXAM: 1. FLUOROSCOPIC GUIDED T9 VERTEBRAL BODY BIOPSY/ASPIRATION 2. ATTEMPTED THOUGH UNSUCCESSFUL FLUOROSCOPIC GUIDED ASPIRATION OF THE T8-T9 INTERVERTEBRAL [place] SPACE SECONDARY TO LACK OF PERCUTANEOUS WINDOW.  COMPARISON:  Lumbar spine MRI-04/14/2019; chest CT-04/13/2019  MEDICATIONS: None  ANESTHESIA/SEDATION:  Moderate (conscious) sedation was employed during this procedure. A total of Dilaudid 1 mg IV and Fentanyl 100 mcg was administered intravenously.  Moderate Sedation Time: 34 minutes. The patient's level of consciousness and vital signs were monitored continuously by radiology nursing throughout the procedure under my direct supervision.  CONTRAST:  None  FLUOROSCOPY TIME:  7 minutes, 36 seconds (758 mGy)  COMPLICATIONS: None immediate.  PROCEDURE: Informed written consent was obtained from the patient (via the use of a medical translator) after a discussion of the risks, benefits and alternatives to treatment. A timeout was performed prior to the initiation of the procedure. The patient was positioned prone on the fluoroscopy table. Initially, the T8-T9 intervertebral disc space was marked fluoroscopically.  Utilizing an oblique, left-sided approach, an 18 gauge trocar needle was utilized to attempt aspiration of the T8-T9 intervertebral disc space however this ultimately proved unsuccessful secondary to lack of an adequate percutaneous window.  As such, decision was made to perform a T9 vertebral body biopsy/aspiration.  As such, the left pedicle at T9 was targeted with a 13 gauge bone biopsy device. Next, an inner 15 gauge needle was advanced to the mid aspect of the T9 vertebral body. Appropriate positioning was confirmed in both anterior and lateral projection images.  At this point, bone biopsy was obtained. Next, through the 13 gauge bone biopsy device, small amount of  saline was instilled and subsequently aspirated. All aspirated fluid was capped and sent to the laboratory for analysis.  The needle was removed and superficial hemostasis was achieved with manual compression. A dressing was placed. The patient tolerated the procedure well without immediate postprocedural complication.  IMPRESSION: 1. Technically successful fluoroscopic guided biopsy of T9 vertebral body. 2. Attempted though  unsuccessful fluoroscopic guided aspiration the T9-T10 intervertebral disc space secondary to lack of adequate percutaneous window.   Electronically Signed   By: Sandi Mariscal M.D.   On: 04/20/2019 08:09  DG CHEST PORT 1 VIEW  Result Date: 04/21/2019 CLINICAL DATA:  Poorly controlled diabetes, recent hospitalization for disseminated MRSA bacteremia, discitis and osteomyelitis EXAM: PORTABLE CHEST 1 VIEW COMPARISON:  Chest CTA 04/13/2019 FINDINGS: A right upper extremity PICC terminates at the superior cavoatrial junction. Telemetry leads overlie the chest. Lung volumes are diminished with some streaky bandlike areas of opacity in the lungs favoring atelectasis. No consolidative opacity. No pneumothorax or visible effusion. The cardiomediastinal contours are unremarkable. Degenerative changes are present in the imaged spine and shoulders. Changes of T8-T10 discitis/osteomyelitis are not well visualized in the absence of lateral radiography. IMPRESSION: 1. Low lung volumes with streaky bandlike areas of opacity in the lungs favoring atelectasis. 2. Changes of known T8-T10 discitis/osteomyelitis are not well visualized in the absence of lateral radiography. Electronically Signed   By: Lovena Le M.D.   On: 04/21/2019 22:25   DG Chest Portable 1 View  Result Date: 04/13/2019 CLINICAL DATA:  44 year old male with a history of chills and recent endocarditis EXAM: PORTABLE CHEST 1 VIEW COMPARISON:  02/24/2019, 02/22/2011 FINDINGS: Cardiomediastinal silhouette unchanged in size contour. Questionable reticulonodular opacity overlying the left cardiac border. No pneumothorax. No pleural effusion. IMPRESSION: Questionable reticulonodular opacity overlying the cardiac silhouette and the left heart border, potentially developing infection. Further evaluation with a formal PA and lateral chest x-ray may be useful, or alternatively CT. Electronically Signed   By: Corrie Mckusick D.O.   On: 04/13/2019 14:34   IR  CERVICAL/THORACIC DISC ASP W/IMAG GUI  Result Date: 04/20/2019 INDICATION: Concern for discitis/osteomyelitis involving the T9 vertebral body as well as the adjacent T8 and T9 intervertebral disc spaces. Please perform image guided aspiration for tissue diagnostic purposes. EXAM: 1. FLUOROSCOPIC GUIDED T9 VERTEBRAL BODY BIOPSY/ASPIRATION 2. ATTEMPTED THOUGH UNSUCCESSFUL FLUOROSCOPIC GUIDED ASPIRATION OF THE T8-T9 INTERVERTEBRAL [place] SPACE SECONDARY TO LACK OF PERCUTANEOUS WINDOW. COMPARISON:  Lumbar spine MRI-04/14/2019; chest CT-04/13/2019 MEDICATIONS: None ANESTHESIA/SEDATION: Moderate (conscious) sedation was employed during this procedure. A total of Dilaudid 1 mg IV and Fentanyl 100 mcg was administered intravenously. Moderate Sedation Time: 34 minutes. The patient's level of consciousness and vital signs were monitored continuously by radiology nursing throughout the procedure under my direct supervision. CONTRAST:  None FLUOROSCOPY TIME:  7 minutes, 36 seconds (756 mGy) COMPLICATIONS: None immediate. PROCEDURE: Informed written consent was obtained from the patient (via the use of a medical translator) after a discussion of the risks, benefits and alternatives to treatment. A timeout was performed prior to the initiation of the procedure. The patient was positioned prone on the fluoroscopy table. Initially, the T8-T9 intervertebral disc space was marked fluoroscopically. Utilizing an oblique, left-sided approach, an 18 gauge trocar needle was utilized to attempt aspiration of the T8-T9 intervertebral disc space however this ultimately proved unsuccessful secondary to lack of an adequate percutaneous window. As such, decision was made to perform a T9 vertebral body biopsy/aspiration. As such, the left pedicle at T9 was targeted with a 13 gauge  bone biopsy device. Next, an inner 15 gauge needle was advanced to the mid aspect of the T9 vertebral body. Appropriate positioning was confirmed in both anterior and  lateral projection images. At this point, bone biopsy was obtained. Next, through the 13 gauge bone biopsy device, small amount of saline was instilled and subsequently aspirated. All aspirated fluid was capped and sent to the laboratory for analysis. The needle was removed and superficial hemostasis was achieved with manual compression. A dressing was placed. The patient tolerated the procedure well without immediate postprocedural complication. IMPRESSION: 1. Technically successful fluoroscopic guided biopsy of T9 vertebral body. 2. Attempted though unsuccessful fluoroscopic guided aspiration the T9-T10 intervertebral disc space secondary to lack of adequate percutaneous window. Electronically Signed   By: Sandi Mariscal M.D.   On: 04/20/2019 08:09   VAS [place] LOWER EXTREMITY SAPHENOUS VEIN MAPPING  Result Date: 04/20/2019 LOWER EXTREMITY VEIN MAPPING Indications:  pre op bypass Risk Factors: PAD.  Comparison Study: no prior Performing Technologist: Abram Sander RVS  Examination Guidelines: A complete evaluation includes B-mode imaging, spectral Doppler, color Doppler, and power Doppler as needed of all accessible portions of each vessel. Bilateral testing is considered an integral part of a complete examination. Limited examinations for reoccurring indications may be performed as noted. +---------------+-----------+----------------------+---------------+-----------+   RT Diameter  RT Findings         GSV            LT Diameter  LT Findings      (cm)                                            (cm)                  +---------------+-----------+----------------------+---------------+-----------+      0.63                     Saphenofemoral         0.63                                                   Junction                                  +---------------+-----------+----------------------+---------------+-----------+      0.40                     Proximal thigh         0.42                   +---------------+-----------+----------------------+---------------+-----------+      0.32                       Mid thigh            0.36                  +---------------+-----------+----------------------+---------------+-----------+      0.25                      Distal thigh          0.36                  +---------------+-----------+----------------------+---------------+-----------+  0.28                          Knee              0.30                  +---------------+-----------+----------------------+---------------+-----------+      0.29                       Prox calf            0.24                  +---------------+-----------+----------------------+---------------+-----------+      0.23                        Mid calf            0.23       branching  +---------------+-----------+----------------------+---------------+-----------+      0.23                      Distal calf           0.19                  +---------------+-----------+----------------------+---------------+-----------+      0.12                         Ankle              0.19                  +---------------+-----------+----------------------+---------------+-----------+ Diagnosing physician: Harold Barban MD Electronically signed by Harold Barban MD on 04/20/2019 at 1:46:22 PM.    Final    ECHOCARDIOGRAM COMPLETE  Result Date: 04/14/2019   ECHOCARDIOGRAM REPORT   Patient Name:   Thomas [name] Date of Exam: 04/14/2019 Medical Rec #:  [id number]       Height: Accession #:    [id number]      Weight: Date of Birth:  October 27, 1975       BSA: Patient Age:    84 years        BP:           134/87 mmHg Patient Gender: M               HR:           102 bpm. Exam Location:  Inpatient Procedure: 2D Echo                             MODIFIED REPORT: This report was modified by Eleonore Chiquito MD on 04/14/2019 due to error.  Indications:     Bactermia  History:         Patient has no prior history of  Echocardiogram examinations.                  Risk Factors:Diabetes, Hypertension, Dyslipidemia and Former                  Smoker.  Sonographer:     Leavy Cella Referring Phys:  [id number] Arvilla Meres DAM Diagnosing Phys: Eleonore Chiquito MD IMPRESSIONS  1. No evidence of valvular vegetations on this study. If there is clinical suspicion for endocarditis, would recommend TEE to rule this out.  2. Left ventricular ejection fraction, by  visual estimation, is 55 to 60%. The left ventricle has normal function. There is borderline left ventricular hypertrophy.  3. The left ventricle has no regional wall motion abnormalities.  4. Global right ventricle has normal systolic function.The right ventricular size is normal. No increase in right ventricular wall thickness.  5. Left atrial size was normal.  6. Right atrial size was normal.  7. Presence of pericardial fat pad.  8. The pericardial effusion is lateral to the left ventricle.  9. Trivial pericardial effusion is present. 10. The mitral valve is grossly normal. No evidence of mitral valve regurgitation. 11. The tricuspid valve is grossly normal. 12. The aortic valve is tricuspid. Aortic valve regurgitation is not visualized. No evidence of aortic valve sclerosis or stenosis. 13. The pulmonic valve was grossly normal. Pulmonic valve regurgitation is not visualized. 14. The inferior vena cava is normal in size with greater than 50% respiratory variability, suggesting right atrial pressure of 3 mmHg. 15. No prior Echocardiogram. 16. TR signal is inadequate for assessing pulmonary artery systolic pressure. FINDINGS  Left Ventricle: Left ventricular ejection fraction, by visual estimation, is 55 to 60%. The left ventricle has normal function. The left ventricle has no regional wall motion abnormalities. The left ventricular internal cavity size was the left ventricle is normal in size. There is borderline left ventricular hypertrophy. Left ventricular diastolic parameters  were normal. Normal left atrial pressure. Right Ventricle: The right ventricular size is normal. No increase in right ventricular wall thickness. Global RV systolic function is has normal systolic function. Left Atrium: Left atrial size was normal in size. Right Atrium: Right atrial size was normal in size Pericardium: Trivial pericardial effusion is present. The pericardial effusion is lateral to the left ventricle. Presence of pericardial fat pad. Mitral Valve: The mitral valve is grossly normal. No evidence of mitral valve regurgitation. Tricuspid Valve: The tricuspid valve is grossly normal. Tricuspid valve regurgitation is trivial. Aortic Valve: The aortic valve is tricuspid. Aortic valve regurgitation is not visualized. The aortic valve is structurally normal, with no evidence of sclerosis or stenosis. Pulmonic Valve: The pulmonic valve was grossly normal. Pulmonic valve regurgitation is not visualized. Pulmonic regurgitation is not visualized. Aorta: The aortic root is normal in size and structure. Venous: The inferior vena cava is normal in size with greater than 50% respiratory variability, suggesting right atrial pressure of 3 mmHg. IAS/Shunts: No atrial level shunt detected by color flow Doppler.  LEFT VENTRICLE PLAX 2D LVIDd:         3.90 cm LVIDs:         3.30 cm LV PW:         1.10 cm LV IVS:        1.20 cm LVOT diam:     2.00 cm LV SV:         22 ml LVOT Area:     3.14 cm  RIGHT VENTRICLE RV S prime:     1290.00 cm/s TAPSE (M-mode): 2.4 cm LEFT ATRIUM           RIGHT ATRIUM LA diam:      3.60 cm RA Area:     12.30 cm LA Vol (A2C): 28.1 ml RA Volume:   29.30 ml LA Vol (A4C): 30.8 ml  AORTIC VALVE LVOT Vmax:   92.86 cm/s LVOT Vmean:  60.762 cm/s LVOT VTI:    0.154 m MITRAL VALVE MV Area (PHT): 4.36 cm               SHUNTS  MV PHT:        50.46 msec             Systemic VTI:  0.15 m MV Decel Time: 174 msec               Systemic Diam: 2.00 cm MV E velocity: 73.30 cm/s   103 cm/s MV A velocity: 6220.00  cm/s 70.3 cm/s MV E/A ratio:  0.01         1.5  Eleonore Chiquito MD Electronically signed by Eleonore Chiquito MD Signature Date/Time: 04/14/2019/1:51:06 PM    Final (Updated)    ECHO TEE  Result Date: 04/19/2019   TRANSESOPHOGEAL ECHO REPORT   Patient Name:   Thomas [name] Date of Exam: 04/19/2019 Medical Rec #:  [id number]       Height:       70.0 in Accession #:    [id number]      Weight:       190.1 lb Date of Birth:  [birth year]       BSA:          2.04 m Patient Age:    67 years        BP:           154/90 mmHg Patient Gender: M               HR:           90 bpm. Exam Location:  Inpatient  Procedure: Transesophageal Echo, Cardiac Doppler, Color Doppler and 3D Echo Indications:     Endocarditis  History:         Patient has prior history of Echocardiogram examinations, most                  recent 04/14/2019. MRSA.  Sonographer:     Dustin Flock Referring Phys:  [id number] Abigail Butts Diagnosing Phys: Oswaldo Milian MD  PROCEDURE: The transesophogeal probe was passed through the esophogus of the patient. The patient developed Respiratory depression during the procedure. IMPRESSIONS  1. Left ventricular ejection fraction, by visual estimation, is 50 to 55%. The left ventricle has low normal function. There is mildly increased left ventricular hypertrophy. Inferior hypokinesis  2. Global right ventricle has normal systolic function.The right ventricular size is normal.  3. The mitral valve is normal in structure. Trivial mitral valve regurgitation.  4. The tricuspid valve is normal in structure.  5. The aortic valve is tricuspid. Aortic valve regurgitation is not visualized.  6. The pulmonic valve was grossly normal. Pulmonic valve regurgitation is not visualized.  7. No vegetation seen. FINDINGS  Left Ventricle: Left ventricular ejection fraction, by visual estimation, is 50 to 55%. The left ventricle has low normal function. The left ventricle demonstrates regional wall motion abnormalities. There is  mildly increased left ventricular hypertrophy. Right Ventricle: The right ventricular size is normal. No increase in right ventricular wall thickness. Global RV systolic function is has normal systolic function. Left Atrium: Left atrial size was normal in size. Right Atrium: Right atrial size was normal in size Pericardium: There is no evidence of pericardial effusion. Mitral Valve: The mitral valve is normal in structure. Trivial mitral valve regurgitation. Tricuspid Valve: The tricuspid valve is normal in structure. Tricuspid valve regurgitation is not demonstrated. Aortic Valve: The aortic valve is tricuspid. Aortic valve regurgitation is not visualized. Pulmonic Valve: The pulmonic valve was grossly normal. Pulmonic valve regurgitation is not visualized. Aorta: The aortic root is normal in size and structure. Shunts: The  atrial septum is grossly normal.  Oswaldo Milian MD Electronically signed by Oswaldo Milian MD Signature Date/Time: 04/19/2019/7:15:44 PM    Final    [place] EKG SITE RITE  Result Date: 04/21/2019 If Site Rite image not attached, placement could not be confirmed due to current cardiac rhythm.      Discharge Exam: Vitals:   04/30/19 0200 04/30/19 0744  BP: 112/77 110/76  Pulse: 84 84  Resp: 18 18  Temp: 97.7 F (36.5 C) 97.7 F (36.5 C)  SpO2: 100% 100%     General: Pt is alert, awake, not in acute distress Cardiovascular: RRR, S1/S2 +, no edema Respiratory: CTA bilaterally, no wheezing, no rhonchi, no respiratory distress, no conversational dyspnea  Abdominal: Soft, NT, ND, bowel sounds + Extremities: no edema, no cyanosis, right leg incision site clean and dry  Psych: Normal mood and affect, stable judgement and insight     The results of significant diagnostics from this hospitalization (including imaging, microbiology, ancillary and laboratory) are listed below for reference.     Microbiology: Recent Results (from the past 240 hour(s))  Surgical pcr  screen     Status: None   Collection Time: 04/22/19  4:06 AM   Specimen: Nasal Mucosa; Nasal Swab  Result Value Ref Range Status   MRSA, PCR NEGATIVE NEGATIVE Final   Staphylococcus aureus NEGATIVE NEGATIVE Final    Comment: (NOTE) The Xpert SA Assay (FDA approved for NASAL specimens in patients 90 years of age and older), is one component of a comprehensive surveillance program. It is not intended to diagnose infection nor to guide or monitor treatment. Performed at [facility], [place] [address] [place], [place] [address]      Labs: BNP (last 3 results) No results for input(s): BNP in the last 8760 hours. Basic Metabolic Panel: Recent Labs  Lab 04/24/19 0447 04/25/19 0520 04/26/19 0424 04/27/19 0500  NA 135 136 134* 138  K 3.7 3.5 3.8 3.6  CL 103 103 101 104  CO2 '25 26 25 25  ' GLUCOSE 155* 114* 206* 146*  BUN 7 5* 8 6  CREATININE 0.88 0.81 0.81 0.75  CALCIUM 8.2* 8.4* 8.2* 8.4*   Liver Function Tests: No results for input(s): AST, ALT, ALKPHOS, BILITOT, PROT, ALBUMIN in the last 168 hours. No results for input(s): LIPASE, AMYLASE in the last 168 hours. No results for input(s): AMMONIA in the last 168 hours. CBC: Recent Labs  Lab 04/24/19 0447 04/24/19 0447 04/25/19 0520 04/25/19 0520 04/26/19 0424 04/27/19 0500 04/28/19 0500 04/29/19 0500 04/30/19 0617  WBC 8.1   < > 5.8   < > 7.3 5.6 5.6 7.1 7.3  NEUTROABS 5.5  --  3.8  --  5.4 3.5 3.6  --   --   HGB 7.4*   < > 7.0*   < > 7.1* 7.2* 7.2* 7.6* 7.3*  HCT 23.6*   < > 22.0*   < > 23.0* 23.0* 23.7* 24.7* 24.1*  MCV 81.1   < > 81.5   < > 82.4 81.0 81.2 82.1 83.1  PLT 426*   < > 388   < > 435* 497* 571* 563* 555*   < > = values in this interval not displayed.   Cardiac Enzymes: Recent Labs  Lab 04/24/19 0447  CKTOTAL 40*   BNP: Invalid input(s): POCBNP CBG: Recent Labs  Lab 04/29/19 0632 04/29/19 1111 04/29/19 1610 04/29/19 2155 04/30/19 0614  GLUCAP 98 171* 101* 149* 91   D-Dimer No  results for input(s):  DDIMER in the last 72 hours. Hgb A1c No results for input(s): HGBA1C in the last 72 hours. Lipid Profile No results for input(s): CHOL, HDL, LDLCALC, TRIG, CHOLHDL, LDLDIRECT in the last 72 hours. Thyroid function studies No results for input(s): TSH, T4TOTAL, T3FREE, THYROIDAB in the last 72 hours.  Invalid input(s): FREET3 Anemia work up No results for input(s): VITAMINB12, FOLATE, FERRITIN, TIBC, IRON, RETICCTPCT in the last 72 hours. Urinalysis    Component Value Date/Time   COLORURINE YELLOW 04/13/2019 1945   APPEARANCEUR CLEAR 04/13/2019 1945   LABSPEC 1.015 04/13/2019 1945   PHURINE 5.5 04/13/2019 1945   GLUCOSEU NEGATIVE 04/13/2019 1945   HGBUR NEGATIVE 04/13/2019 1945   [place] NEGATIVE 04/13/2019 Time NEGATIVE 04/13/2019 1945   PROTEINUR 30 (A) 04/13/2019 1945   NITRITE NEGATIVE 04/13/2019 1945   LEUKOCYTESUR NEGATIVE 04/13/2019 1945   Sepsis Labs Invalid input(s): PROCALCITONIN,  WBC,  LACTICIDVEN Microbiology Recent Results (from the past 240 hour(s))  Surgical pcr screen     Status: None   Collection Time: 04/22/19  4:06 AM   Specimen: Nasal Mucosa; Nasal Swab  Result Value Ref Range Status   MRSA, PCR NEGATIVE NEGATIVE Final   Staphylococcus aureus NEGATIVE NEGATIVE Final    Comment: (NOTE) The Xpert SA Assay (FDA approved for NASAL specimens in patients 48 years of age and older), is one component of a comprehensive surveillance program. It is not intended to diagnose infection nor to guide or monitor treatment. Performed at [facility], [place] [address] [place], [place] [address]      Patient was seen and examined on the day of discharge and was found to be in stable condition. Time coordinating discharge: 35 minutes including assessment and coordination of care, as well as examination of the patient.   SIGNED:  Dessa Phi, DO [facility] 04/30/2019, 10:43 AM

## 2019-04-30 NOTE — Progress Notes (Signed)
Progress Note    04/30/2019 7:16 AM 8 Days Post-Op  Subjective:  s/p Right pop-TP trunk bypass with RSVG 04/22/19.  He reports improvement in back pain due to heating blanket.  He is ambulating well.  Vitals:   04/29/19 2054 04/30/19 0200  BP: 99/70 112/77  Pulse: 91 84  Resp: 16 18  Temp: 97.7 F (36.5 C) 97.7 F (36.5 C)  SpO2: 100% 100%    Physical Exam: Cardiac: Rate rhythm are regular Lungs: Nonlabored Incisions: Right lower extremity incisions are well approximated and healing without signs of infection. Extremities: Brisk right posterior tibial and peroneal Doppler signals.  Dorsalis pedis signal is present.  Toes 1 through 5 show ischemic changes.  These remain dry without breakdown.  CBC    Component Value Date/Time   WBC 7.3 04/30/2019 0617   RBC 2.90 (L) 04/30/2019 0617   HGB 7.3 (L) 04/30/2019 0617   HCT 24.1 (L) 04/30/2019 0617   PLT 555 (H) 04/30/2019 0617   MCV 83.1 04/30/2019 0617   MCH 25.2 (L) 04/30/2019 0617   MCHC 30.3 04/30/2019 0617   RDW 15.8 (H) 04/30/2019 0617   LYMPHSABS 1.4 04/28/2019 0500   MONOABS 0.4 04/28/2019 0500   EOSABS 0.1 04/28/2019 0500   BASOSABS 0.0 04/28/2019 0500    BMET    Component Value Date/Time   NA 138 04/27/2019 0500   K 3.6 04/27/2019 0500   CL 104 04/27/2019 0500   CO2 25 04/27/2019 0500   GLUCOSE 146 (H) 04/27/2019 0500   BUN 6 04/27/2019 0500   CREATININE 0.75 04/27/2019 0500   CREATININE 0.98 11/14/2016 1352   CALCIUM 8.4 (L) 04/27/2019 0500   GFRNONAA >60 04/27/2019 0500   GFRNONAA >89 11/14/2016 1352   GFRAA >60 04/27/2019 0500   GFRAA >89 11/14/2016 1352     Intake/Output Summary (Last 24 hours) at 04/30/2019 0716 Last data filed at 04/30/2019 0200 Gross per 24 hour  Intake 900 ml  Output 350 ml  Net 550 ml    HOSPITAL MEDICATIONS Scheduled Meds: . sodium chloride   Intravenous Once  . apixaban  5 mg Oral BID  . atorvastatin  10 mg Oral q1800  . Chlorhexidine Gluconate Cloth  6 each  Topical Daily  . clopidogrel  75 mg Oral Q breakfast  . docusate sodium  100 mg Oral Daily  . gabapentin  100 mg Oral QHS  . hydrochlorothiazide  25 mg Oral Daily  . insulin aspart  0-9 Units Subcutaneous TID WC  . insulin glargine  15 Units Subcutaneous QHS  . lisinopril  20 mg Oral Daily  . pantoprazole  40 mg Oral Daily  . pneumococcal 23 valent vaccine  0.5 mL Intramuscular Tomorrow-1000  . saccharomyces boulardii  250 mg Oral BID  . senna-docusate  2 tablet Oral BID  . sodium chloride flush  10-40 mL Intracatheter Q12H  . sodium chloride flush  3 mL Intravenous Q12H   Continuous Infusions: . sodium chloride 250 mL (04/13/19 2351)  . sodium chloride Stopped (04/13/19 2230)  . sodium chloride    . sodium chloride 100 mL/hr at 04/23/19 0951  . ceFTAROline (TEFLARO) IV 250 mL/hr at 04/30/19 0200  . DAPTOmycin (CUBICIN)  IV Stopped (04/29/19 2300)  . magnesium sulfate bolus IVPB     PRN Meds:.sodium chloride, sodium chloride, sodium chloride, acetaminophen, acetaminophen, guaiFENesin-dextromethorphan, hydrALAZINE, ibuprofen, labetalol, magnesium sulfate bolus IVPB, metoprolol tartrate, ondansetron **OR** ondansetron (ZOFRAN) IV, oxyCODONE, oxyCODONE, phenol, potassium chloride, simethicone, sodium chloride flush, sodium chloride flush, traMADol  Assessment:  44 y.o. male is s/p: Right pop-TP trunk bypass with RSVG 04/22/19.  Continues to improve.  Ischemic changes of right toes remain stable. on Plavix and Eliquis has been restarted.  Hemoglobin remains low but is stable  8 Days Post-Op  Plan: -We will continue to follow intermittently.  We will plan on follow-up in the office to observe toes -DVT prophylaxis:  SCDs   Risa Grill, PA-C [facility] [phone] 04/30/2019  7:16 AM

## 2019-05-01 LAB — CBC
HCT: 23.1 % — ABNORMAL LOW (ref 39.0–52.0)
Hemoglobin: 7 g/dL — ABNORMAL LOW (ref 13.0–17.0)
MCH: 25 pg — ABNORMAL LOW (ref 26.0–34.0)
MCHC: 30.3 g/dL (ref 30.0–36.0)
MCV: 82.5 fL (ref 80.0–100.0)
Platelets: 507 10*3/uL — ABNORMAL HIGH (ref 150–400)
RBC: 2.8 MIL/uL — ABNORMAL LOW (ref 4.22–5.81)
RDW: 15.7 % — ABNORMAL HIGH (ref 11.5–15.5)
WBC: 5.8 10*3/uL (ref 4.0–10.5)
nRBC: 0 % (ref 0.0–0.2)

## 2019-05-01 LAB — BASIC METABOLIC PANEL
Anion gap: 7 (ref 5–15)
BUN: 10 mg/dL (ref 6–20)
CO2: 27 mmol/L (ref 22–32)
Calcium: 8.4 mg/dL — ABNORMAL LOW (ref 8.9–10.3)
Chloride: 99 mmol/L (ref 98–111)
Creatinine, Ser: 0.95 mg/dL (ref 0.61–1.24)
GFR calc Af Amer: >60 mL/min (ref >60)
GFR calc non Af Amer: >60 mL/min (ref >60)
Glucose, Bld: 212 mg/dL — ABNORMAL HIGH (ref 70–99)
Potassium: 3.8 mmol/L (ref 3.5–5.1)
Sodium: 133 mmol/L — ABNORMAL LOW (ref 135–145)

## 2019-05-01 LAB — GLUCOSE, CAPILLARY
Glucose-Capillary: 128 mg/dL — ABNORMAL HIGH (ref 70–99)
Glucose-Capillary: 114 mg/dL — ABNORMAL HIGH (ref 70–99)
Glucose-Capillary: 190 mg/dL — ABNORMAL HIGH (ref 70–99)

## 2019-05-01 LAB — CK: Total CK: 31 U/L — ABNORMAL LOW (ref 49–397)

## 2019-05-01 NOTE — Plan of Care (Signed)
  Problem: Education: Goal: Knowledge of General Education information will improve Description: Including pain rating scale, medication(s)/side effects and non-pharmacologic comfort measures 05/01/2019 0026 by Claudie Leach, RN Outcome: Progressing 05/01/2019 0026 by Claudie Leach, RN Outcome: Progressing   Problem: Health Behavior/Discharge Planning: Goal: Ability to manage health-related needs will improve 05/01/2019 0026 by Claudie Leach, RN Outcome: Progressing 05/01/2019 0026 by Claudie Leach, RN Outcome: Progressing   Problem: Clinical Measurements: Goal: Ability to maintain clinical measurements within normal limits will improve 05/01/2019 0026 by Claudie Leach, RN Outcome: Progressing 05/01/2019 0026 by Claudie Leach, RN Outcome: Progressing Goal: Diagnostic test results will improve Outcome: Progressing Goal: Respiratory complications will improve Outcome: Progressing Goal: Cardiovascular complication will be avoided Outcome: Progressing   Problem: Activity: Goal: Risk for activity intolerance will decrease Outcome: Progressing   Problem: Coping: Goal: Level of anxiety will decrease Outcome: Progressing

## 2019-05-01 NOTE — Progress Notes (Signed)
PROGRESS NOTE    Thomas [name]  ZOX:[id number] DOB: February 17, 1976 DOA: 04/13/2019 PCP: [name], PA-C     Brief Narrative:  Thomas [name] is a 44 y.o.malewith medical history significant ofDM2 poorly controlled.Recent hospitalization at [facility] for disseminated MRSA bacteremia, osteomyelitis and discitis. Completed 6 weeks course of IV antibiotics on April 04, 2019. Source of bacteremia thought to be from skin source (per previous facility report, area on toe that appeared cellulitic, area in R back that was erythematous and an abrasion of the lateral R aspect of his shin, abscess formation in the R calf and pyomyositis of the L vastus intermedius).Developed necrosis of his R 1-5 digits sincehisdischarge.  Presentedherewith worsening back pain, generalized weakness, fever and recent fall. CT chest, abdomen and pelvis notable for findings of advanced osteomyelitis involving T9 as well as discitis.Positive blood cultures for MRSA. Seen by infectious disease,recommendations for IV Vancomycin. Pt underwent TEE on 04/19/2019 which did not demonstrate vegetations or other evidence of persistent endocarditis.   He was also taken to the vascular lab by Dr. Chestine Spore for Aortogram, bilateral lower extremity arteriogram with runoff, right external iliac artery angioplasty with stent placement under moderate conscious sedation. This was performed for critical ischemia of the right lower extremity with gangrene of digits 1-5 of the right foot. Dr. Chestine Spore performed a right SFA to TP trunk bypass for popliteal occlusion in addition to iliac lesion on 04/22/2019. The patient apparently developed a thigh hematoma and received transfusion of 2 units PRBC.  Neurosurgery was reconsulted on 04/24/2019 for re-evaluation of thoracic spine discitis due to concern for persistent infection in this area. I appreciate neurosurgery's assistance. Dr. [name] has continued to recommend medical  management of the patient's T8-T-10 osteomyelitis/diskitis. The patient is a poor surgical candidate. TLSO brace has been ordered.  New events last 24 hours / Subjective: Patient evaluated with audio interpreter.  Patient medically stable to discharge, however need to arrange outpatient IV antibiotic administration, hopefully this will be arranged by Monday afternoon/Tuesday morning.  Patient very worried about going home as he lives alone and does not have any family or friend support.  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 New discitis at T8-9 and T9-T10 with T8 and T10 osteomyelitis. Moderate to severe spinal stenosis at T8-T9. Persistent extensive anterior paravertebral soft tissue inflammation/phlegmon. Decreased posterior paraspinal inflammation without residual or new abscess. -Status post aspiration of T9 abscess by IR on 04/15/2019.  Deep tissue culture growing staph aureus -Status post TEE 04/18/2018 and demonstrated no vegetations or evidence of persistent endocarditis -Neurosurgery, Dr. [name], has continued to recommend medical management of patient's T8-T-10 osteomyelitis/diskitis. The patient is a poor surgical candidate. Surgical approach will be withheld pending neurologic decline related to stenosis or subacute/chronic development of significant kyphotic deformity requiring stabilization. TLSO brace has been ordered -Repeat blood culture 04/19/2019  negative -Infectious disease following.  Continue IV Cetaroline and Daptomycin for a total of 8 weeks. Last day of therapy will be 06/16/2019  Peripheral artery disease -Right  first through fifth toes dry gangrene.  Status post bilateral arteriogram with bilateral runoff and stent placement in right EIA. The patient returned to vascular lab on 04/22/2019 for right SFA to TP trunk bypass for popliteal occlusion in addition to iliac lesion -Continue Plavix  History of DVT -Eliquis  Essential hypertension -HCTZ, lisinopril  Hyperlipidemia -Lipitor  Possible rectal mass -Will need GI evaluation when more stable and bacteremia has cleared  Type 2 diabetes with hyperglycemia and diabetic neuropathy -Hemoglobin A1c8.3 on 04/14/2019 -Continue Lantus, sliding scale insulin -Resume gabapentin   DVT prophylaxis: Eliquis Code Status: Full Family Communication: None at bedside Disposition Plan: Discharge home with home health once outpatient IV antibiotic administration issues cleared up   Consultants:   Vascular surgery  Interventional radiology  Infectious disease  Wound Care  Neurosurgery   Antimicrobials:  Anti-infectives (From admission, onward)   Start     Dose/Rate Route Frequency Ordered Stop   04/30/19 0000  ceftaroline (TEFLARO) IVPB     600 mg Intravenous Every 8 hours 04/30/19 1042 06/18/19 2359   04/30/19 0000  daptomycin (CUBICIN) IVPB     850 mg Intravenous Every 24 hours 04/30/19 1042 06/18/19 2359   04/22/19 2000  DAPTOmycin (CUBICIN) 850 mg in sodium chloride 0.9 % IVPB     850 mg 234 mL/hr over 30 Minutes Intravenous Daily 04/22/19 1652     04/22/19 1700  ceftaroline (TEFLARO) 600 mg in sodium chloride 0.9 % 250 mL IVPB     600 mg 250 mL/hr over 60 Minutes Intravenous Every 8 hours 04/22/19 1652     04/22/19 1600  ceFAZolin (ANCEF) IVPB 2g/100 mL premix     2 g 200 mL/hr over 30 Minutes Intravenous Every 8 hours 04/22/19 1525 04/23/19 0217   04/22/19  0600  ceFAZolin (ANCEF) IVPB 2g/100 mL premix    Note to Pharmacy: Send with pt to OR   2 g 200 mL/hr over 30 Minutes Intravenous To Short Stay 04/21/19 0743 04/22/19 0813   04/18/19 1800  vancomycin (VANCOCIN) IVPB 1000 mg/200 mL premix  Status:  Discontinued     1,000 mg 200 mL/hr over 60 Minutes Intravenous Every 12 hours 04/18/19 0718 04/22/19 1652   04/14/19 1600  vancomycin (VANCOREADY) IVPB 1500 mg/300 mL  Status:  Discontinued     1,500 mg 150 mL/hr over 120 Minutes Intravenous Every 12 hours 04/14/19 1240 04/18/19 0718   04/13/19 2200  ceFEPIme (MAXIPIME) 2 g in sodium chloride 0.9 % 100 mL IVPB  Status:  Discontinued     2 g 200 mL/hr over 30 Minutes Intravenous Every 8 hours 04/13/19 1428 04/14/19 1028   04/13/19 1430  vancomycin (VANCOCIN) IVPB 1000 mg/200 mL premix  Status:  Discontinued     1,000 mg 200 mL/hr over 60 Minutes Intravenous Every 12 hours 04/13/19 1428 04/14/19 1240   04/13/19 1415  ceFEPIme (MAXIPIME) 2 g in sodium chloride 0.9 % 100 mL IVPB     2 g 200 mL/hr over 30 Minutes Intravenous  Once 04/13/19 1411 04/13/19 1505   04/13/19 1415  metroNIDAZOLE (FLAGYL) IVPB 500 mg     500 mg 100 mL/hr over 60 Minutes Intravenous  Once 04/13/19 1411 04/13/19 1600   04/13/19 1415  vancomycin (VANCOCIN) IVPB 1000 mg/200 mL premix  Status:  Discontinued     1,000 mg 200 mL/hr over 60 Minutes Intravenous  Once 04/13/19 1411 04/13/19 1428       Objective: Vitals:   04/30/19 1104 04/30/19 2108 05/01/19 0511 05/01/19 1350  BP: 121/72 99/72 (!) 155/61 112/75  Pulse:  90 80 89  Resp:      Temp:  (!) 97.5 F (36.4 C) (!) 97.3 F (36.3 C) 98.4 F (36.9 C)  TempSrc:  Oral Oral Oral  SpO2:  98% 100% 100%  Weight:      Height:        Intake/Output Summary (Last 24 hours) at 05/01/2019 1411 Last data filed at 05/01/2019 1300 Gross per 24 hour  Intake 240 ml  Output 3200 ml  Net -2960 ml   Filed Weights   04/20/19 0412 04/21/19 0308 04/22/19 0406  Weight: 88.3 kg  87.4 kg 87.9 kg    Examination: General exam: Appears calm and comfortable  Respiratory system: Clear to auscultation. Respiratory effort normal. Cardiovascular system: S1 & S2 heard, RRR. No pedal edema. Gastrointestinal system: Abdomen is nondistended, soft and nontender. Normal bowel sounds heard. Central nervous system: Alert and oriented. Non focal exam. Speech clear  Extremities: Symmetric in appearance bilaterally  Skin: Right lower extremity incision sites clean and dry, some firm areas noted Psychiatry: Judgement and insight appear stable. Mood & affect appropriate.    Data Reviewed: I have personally reviewed following labs and imaging studies  CBC: Recent Labs  Lab 04/25/19 0520 04/25/19 0520 04/26/19 0424 04/26/19 0424 04/27/19 0500 04/28/19 0500 04/29/19 0500 04/30/19 0617 05/01/19 0418  WBC 5.8   < > 7.3   < > 5.6 5.6 7.1 7.3 5.8  NEUTROABS 3.8  --  5.4  --  3.5 3.6  --   --   --   HGB 7.0*   < > 7.1*   < > 7.2* 7.2* 7.6* 7.3* 7.0*  HCT 22.0*   < > 23.0*   < > 23.0* 23.7* 24.7* 24.1* 23.1*  MCV 81.5   < > 82.4   < > 81.0 81.2 82.1 83.1 82.5  PLT 388   < > 435*   < > 497* 571* 563* 555* 507*   < > = values in this interval not displayed.   Basic Metabolic Panel: Recent Labs  Lab 04/25/19 0520 04/26/19 0424 04/27/19 0500 05/01/19 0418  NA 136 134* 138 133*  K 3.5 3.8 3.6 3.8  CL 103 101 104 99  CO2 26 25 25 27   GLUCOSE 114* 206* 146* 212*  BUN 5* 8 6 10   CREATININE 0.81 0.81 0.75 0.95  CALCIUM 8.4* 8.2* 8.4* 8.4*   GFR: Estimated Creatinine Clearance: 113.2 mL/min (by C-G formula based on SCr of 0.95 mg/dL). Liver Function Tests: No results for input(s): AST, ALT, ALKPHOS, BILITOT, PROT, ALBUMIN in the last 168 hours. No results for input(s): LIPASE, AMYLASE in the last 168 hours. No results for input(s): AMMONIA in the last 168 hours. Coagulation Profile: No results for input(s): INR, PROTIME in the last 168 hours. Cardiac Enzymes: Recent Labs   Lab 05/01/19 0418  CKTOTAL 31*   BNP (last 3 results) No results for input(s): PROBNP in the last 8760 hours. HbA1C: No results for input(s): HGBA1C in the last 72 hours. CBG: Recent Labs  Lab 04/30/19 0614 04/30/19 1237 04/30/19 1708 04/30/19 2107 05/01/19 1132  GLUCAP 91 145* 100* 158* 128*   Lipid Profile: No results for input(s): CHOL, HDL, LDLCALC, TRIG, CHOLHDL, LDLDIRECT in the last 72 hours. Thyroid Function Tests: No results for input(s): TSH, T4TOTAL, FREET4, T3FREE, THYROIDAB in the last 72 hours. Anemia Panel: No results for input(s): VITAMINB12, FOLATE, FERRITIN, TIBC, IRON, RETICCTPCT in the last 72 hours. Sepsis Labs:  No results for input(s): PROCALCITON, LATICACIDVEN in the last 168 hours.  Recent Results (from the past 240 hour(s))  Surgical pcr screen     Status: None   Collection Time: 04/22/19  4:06 AM   Specimen: Nasal Mucosa; Nasal Swab  Result Value Ref Range Status   MRSA, PCR NEGATIVE NEGATIVE Final   Staphylococcus aureus NEGATIVE NEGATIVE Final    Comment: (NOTE) The Xpert SA Assay (FDA approved for NASAL specimens in patients 50 years of age and older), is one component of a comprehensive surveillance program. It is not intended to diagnose infection nor to guide or monitor treatment. Performed at [facility], [address] [place], [place] [address]       Radiology Studies: No results found.    Scheduled Meds: . sodium chloride   Intravenous Once  . apixaban  5 mg Oral BID  . atorvastatin  10 mg Oral q1800  . Chlorhexidine Gluconate Cloth  6 each Topical Daily  . clopidogrel  75 mg Oral Q breakfast  . docusate sodium  100 mg Oral Daily  . gabapentin  100 mg Oral QHS  . hydrochlorothiazide  25 mg Oral Daily  . insulin aspart  0-9 Units Subcutaneous TID WC  . insulin glargine  15 Units Subcutaneous QHS  . lisinopril  20 mg Oral Daily  . pantoprazole  40 mg Oral Daily  . pneumococcal 23 valent vaccine  0.5 mL  Intramuscular Tomorrow-1000  . saccharomyces boulardii  250 mg Oral BID  . senna-docusate  2 tablet Oral BID  . sodium chloride flush  10-40 mL Intracatheter Q12H  . sodium chloride flush  3 mL Intravenous Q12H   Continuous Infusions: . sodium chloride 250 mL (04/13/19 2351)  . sodium chloride Stopped (04/13/19 2230)  . sodium chloride    . sodium chloride 100 mL/hr at 04/23/19 0951  . ceFTAROline (TEFLARO) IV 600 mg (05/01/19 0924)  . DAPTOmycin (CUBICIN)  IV 850 mg (04/30/19 2043)  . magnesium sulfate bolus IVPB       LOS: 18 days      Time spent: 25 minutes   Noralee Stain, DO [facility] 05/01/2019, 2:11 PM   Available via Epic secure chat 7am-7pm After these hours, please refer to coverage provider listed on [email or web address]

## 2019-05-02 ENCOUNTER — Telehealth: Payer: Self-pay | Admitting: Medical

## 2019-05-02 LAB — GLUCOSE, CAPILLARY
Glucose-Capillary: 116 mg/dL — ABNORMAL HIGH (ref 70–99)
Glucose-Capillary: 143 mg/dL — ABNORMAL HIGH (ref 70–99)
Glucose-Capillary: 156 mg/dL — ABNORMAL HIGH (ref 70–99)
Glucose-Capillary: 120 mg/dL — ABNORMAL HIGH (ref 70–99)

## 2019-05-02 LAB — CBC
HCT: 24.5 % — ABNORMAL LOW (ref 39.0–52.0)
Hemoglobin: 7.5 g/dL — ABNORMAL LOW (ref 13.0–17.0)
MCH: 24.9 pg — ABNORMAL LOW (ref 26.0–34.0)
MCHC: 30.6 g/dL (ref 30.0–36.0)
MCV: 81.4 fL (ref 80.0–100.0)
Platelets: 582 10*3/uL — ABNORMAL HIGH (ref 150–400)
RBC: 3.01 MIL/uL — ABNORMAL LOW (ref 4.22–5.81)
RDW: 15.7 % — ABNORMAL HIGH (ref 11.5–15.5)
WBC: 7.2 10*3/uL (ref 4.0–10.5)
nRBC: 0 % (ref 0.0–0.2)

## 2019-05-02 NOTE — Telephone Encounter (Signed)
Very complicated pt who has been hospitalized recently. He needs 40 minutes appointment.

## 2019-05-02 NOTE — Progress Notes (Signed)
PROGRESS NOTE    Thomas [name]  UVO:[id number] DOB: [birth year] DOA: 04/13/2019 PCP: [name], PA-C     Brief Narrative:  Thomas [name] is a 44 y.o.malewith medical history significant ofDM2 poorly controlled.Recent hospitalization at [facility] for disseminated MRSA bacteremia, osteomyelitis and discitis. Completed 6 weeks course of IV antibiotics on April 04, 2019. Source of bacteremia thought to be from skin source (per previous facility report, area on toe that appeared cellulitic, area in R back that was erythematous and an abrasion of the lateral R aspect of his shin, abscess formation in the R calf and pyomyositis of the L vastus intermedius).Developed necrosis of his R 1-5 digits sincehisdischarge.  Presentedherewith worsening back pain, generalized weakness, fever and recent fall. CT chest, abdomen and pelvis notable for findings of advanced osteomyelitis involving T9 as well as discitis.Positive blood cultures for MRSA. Seen by infectious disease,recommendations for IV Vancomycin. Pt underwent TEE on 04/19/2019 which did not demonstrate vegetations or other evidence of persistent endocarditis.   He was also taken to the vascular lab by Dr. Chestine Spore for Aortogram, bilateral lower extremity arteriogram with runoff, right external iliac artery angioplasty with stent placement under moderate conscious sedation. This was performed for critical ischemia of the right lower extremity with gangrene of digits 1-5 of the right foot. Dr. Chestine Spore performed a right SFA to TP trunk bypass for popliteal occlusion in addition to iliac lesion on 04/22/2019. The patient apparently developed a thigh hematoma and received transfusion of 2 units PRBC.  Neurosurgery was reconsulted on 04/24/2019 for re-evaluation of thoracic spine discitis due to concern for persistent infection in this area. I appreciate neurosurgery's assistance. Dr. [name] has continued to recommend medical  management of the patient's T8-T-10 osteomyelitis/diskitis. The patient is a poor surgical candidate. TLSO brace has been ordered.  New events last 24 hours / Subjective: Patient evaluated with iPad interpreter.  Patient medically stable to discharge, however need to arrange outpatient IV antibiotic administration, hopefully this will be arranged by Monday afternoon/Tuesday morning. Still having back pain but improved with K Pad. Having some RLE edema. Concerned about how he will manage pain at home.   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 New discitis at T8-9 and T9-T10 with T8 and T10 osteomyelitis. Moderate to severe spinal stenosis at T8-T9. Persistent extensive anterior paravertebral soft tissue inflammation/phlegmon. Decreased posterior paraspinal inflammation without residual or new abscess. -Status post aspiration of T9 abscess by IR on 04/15/2019.  Deep tissue culture growing staph aureus -Status post TEE 04/18/2018 and demonstrated no vegetations or evidence of persistent endocarditis -Neurosurgery, Dr. [name], has continued to recommend medical management of patient's T8-T-10 osteomyelitis/diskitis. The patient is a poor surgical candidate. Surgical approach will be withheld pending neurologic decline related to stenosis or subacute/chronic development of significant kyphotic deformity requiring stabilization. TLSO brace has been ordered -Repeat blood culture 04/19/2019  negative -Infectious disease following.  Continue IV Cetaroline and Daptomycin for a total of 8 weeks. Last day of therapy will be 06/16/2019  Peripheral  artery disease -Right first through fifth toes dry gangrene.  Status post bilateral arteriogram with bilateral runoff and stent placement in right EIA. The patient returned to vascular lab on 04/22/2019 for right SFA to TP trunk bypass for popliteal occlusion in addition to iliac lesion -Continue Plavix  History of DVT -Eliquis  Essential hypertension -HCTZ, lisinopril  Hyperlipidemia -Lipitor  Possible rectal mass -Will need GI evaluation when more stable and bacteremia has cleared  Type 2 diabetes with hyperglycemia and diabetic neuropathy -Hemoglobin A1c8.3 on 04/14/2019 -Continue Lantus, sliding scale insulin -Resume gabapentin   DVT prophylaxis: Eliquis Code Status: Full Family Communication: None at bedside Disposition Plan: Discharge home with home health once outpatient IV antibiotic administration issues cleared up   Consultants:   Vascular surgery  Interventional radiology  Infectious disease  Wound Care  Neurosurgery   Antimicrobials:  Anti-infectives (From admission, onward)   Start     Dose/Rate Route Frequency Ordered Stop   04/30/19 0000  ceftaroline (TEFLARO) IVPB     600 mg Intravenous Every 8 hours 04/30/19 1042 06/18/19 2359   04/30/19 0000  daptomycin (CUBICIN) IVPB     850 mg Intravenous Every 24 hours 04/30/19 1042 06/18/19 2359   04/22/19 2000  DAPTOmycin (CUBICIN) 850 mg in sodium chloride 0.9 % IVPB     850 mg 234 mL/hr over 30 Minutes Intravenous Daily 04/22/19 1652     04/22/19 1700  ceftaroline (TEFLARO) 600 mg in sodium chloride 0.9 % 250 mL IVPB     600 mg 250 mL/hr over 60 Minutes Intravenous Every 8 hours 04/22/19 1652     04/22/19 1600  ceFAZolin (ANCEF) IVPB 2g/100 mL premix     2 g 200 mL/hr over 30 Minutes Intravenous Every 8 hours 04/22/19 1525 04/23/19 0217   04/22/19  0600  ceFAZolin (ANCEF) IVPB 2g/100 mL premix    Note to Pharmacy: Send with pt to OR   2 g 200 mL/hr over 30 Minutes Intravenous To Short Stay 04/21/19 0743 04/22/19 0813   04/18/19 1800  vancomycin (VANCOCIN) IVPB 1000 mg/200 mL premix  Status:  Discontinued     1,000 mg 200 mL/hr over 60 Minutes Intravenous Every 12 hours 04/18/19 0718 04/22/19 1652   04/14/19 1600  vancomycin (VANCOREADY) IVPB 1500 mg/300 mL  Status:  Discontinued     1,500 mg 150 mL/hr over 120 Minutes Intravenous Every 12 hours 04/14/19 1240 04/18/19 0718   04/13/19 2200  ceFEPIme (MAXIPIME) 2 g in sodium chloride 0.9 % 100 mL IVPB  Status:  Discontinued     2 g 200 mL/hr over 30 Minutes Intravenous Every 8 hours 04/13/19 1428 04/14/19 1028   04/13/19 1430  vancomycin (VANCOCIN) IVPB 1000 mg/200 mL premix  Status:  Discontinued     1,000 mg 200 mL/hr over 60 Minutes Intravenous Every 12 hours 04/13/19 1428 04/14/19 1240   04/13/19 1415  ceFEPIme (MAXIPIME) 2 g in sodium chloride 0.9 % 100 mL IVPB     2 g 200 mL/hr over 30 Minutes Intravenous  Once 04/13/19 1411 04/13/19 1505   04/13/19 1415  metroNIDAZOLE (FLAGYL) IVPB 500 mg     500 mg 100 mL/hr over 60 Minutes Intravenous  Once 04/13/19 1411 04/13/19 1600   04/13/19 1415  vancomycin (VANCOCIN) IVPB 1000 mg/200 mL premix  Status:  Discontinued     1,000 mg 200 mL/hr over 60 Minutes Intravenous  Once 04/13/19 1411 04/13/19 1428       Objective: Vitals:   05/01/19 0511 05/01/19 1350 05/01/19  2005 05/02/19 0457  BP: (!) 155/61 112/75 116/83 128/82  Pulse: 80 89 87 91  Resp:      Temp: (!) 97.3 F (36.3 C) 98.4 F (36.9 C) 98.2 F (36.8 C) 97.9 F (36.6 C)  TempSrc: Oral Oral Oral Oral  SpO2: 100% 100% 99% 100%  Weight:      Height:        Intake/Output Summary (Last 24 hours) at 05/02/2019 1050 Last data filed at 05/02/2019 1000 Gross per 24 hour  Intake 2275.18 ml  Output 2810 ml  Net -534.82 ml   Filed Weights   04/20/19 0412 04/21/19 0308  04/22/19 0406  Weight: 88.3 kg 87.4 kg 87.9 kg    Examination: General exam: Appears calm and comfortable  Respiratory system: Clear to auscultation. Respiratory effort normal. Cardiovascular system: S1 & S2 heard, RRR. No pedal edema. Gastrointestinal system: Abdomen is nondistended, soft and nontender. Normal bowel sounds heard. Central nervous system: Alert and oriented. Non focal exam. Speech clear  Extremities: Symmetric in appearance bilaterally   Skin: RLE incision sites are clean and dry, underlying hematoma likely  Psychiatry: Judgement and insight appear stable. Mood & affect appropriate.     Data Reviewed: I have personally reviewed following labs and imaging studies  CBC: Recent Labs  Lab 04/26/19 0424 04/26/19 0424 04/27/19 0500 04/27/19 0500 04/28/19 0500 04/29/19 0500 04/30/19 0617 05/01/19 0418 05/02/19 0339  WBC 7.3   < > 5.6   < > 5.6 7.1 7.3 5.8 7.2  NEUTROABS 5.4  --  3.5  --  3.6  --   --   --   --   HGB 7.1*   < > 7.2*   < > 7.2* 7.6* 7.3* 7.0* 7.5*  HCT 23.0*   < > 23.0*   < > 23.7* 24.7* 24.1* 23.1* 24.5*  MCV 82.4   < > 81.0   < > 81.2 82.1 83.1 82.5 81.4  PLT 435*   < > 497*   < > 571* 563* 555* 507* 582*   < > = values in this interval not displayed.   Basic Metabolic Panel: Recent Labs  Lab 04/26/19 0424 04/27/19 0500 05/01/19 0418  NA 134* 138 133*  K 3.8 3.6 3.8  CL 101 104 99  CO2 25 25 27   GLUCOSE 206* 146* 212*  BUN 8 6 10   CREATININE 0.81 0.75 0.95  CALCIUM 8.2* 8.4* 8.4*   GFR: Estimated Creatinine Clearance: 113.2 mL/min (by C-G formula based on SCr of 0.95 mg/dL). Liver Function Tests: No results for input(s): AST, ALT, ALKPHOS, BILITOT, PROT, ALBUMIN in the last 168 hours. No results for input(s): LIPASE, AMYLASE in the last 168 hours. No results for input(s): AMMONIA in the last 168 hours. Coagulation Profile: No results for input(s): INR, PROTIME in the last 168 hours. Cardiac Enzymes: Recent Labs  Lab  05/01/19 0418  CKTOTAL 31*   BNP (last 3 results) No results for input(s): PROBNP in the last 8760 hours. HbA1C: No results for input(s): HGBA1C in the last 72 hours. CBG: Recent Labs  Lab 04/30/19 2107 05/01/19 1132 05/01/19 1628 05/01/19 2101 05/02/19 0637  GLUCAP 158* 128* 114* 190* 116*   Lipid Profile: No results for input(s): CHOL, HDL, LDLCALC, TRIG, CHOLHDL, LDLDIRECT in the last 72 hours. Thyroid Function Tests: No results for input(s): TSH, T4TOTAL, FREET4, T3FREE, THYROIDAB in the last 72 hours. Anemia Panel: No results for input(s): VITAMINB12, FOLATE, FERRITIN, TIBC, IRON, RETICCTPCT in the last 72 hours. Sepsis Labs: No  results for input(s): PROCALCITON, LATICACIDVEN in the last 168 hours.  No results found for this or any previous visit (from the past 240 hour(s)).    Radiology Studies: No results found.    Scheduled Meds: . sodium chloride   Intravenous Once  . apixaban  5 mg Oral BID  . atorvastatin  10 mg Oral q1800  . Chlorhexidine Gluconate Cloth  6 each Topical Daily  . clopidogrel  75 mg Oral Q breakfast  . docusate sodium  100 mg Oral Daily  . gabapentin  100 mg Oral QHS  . hydrochlorothiazide  25 mg Oral Daily  . insulin aspart  0-9 Units Subcutaneous TID WC  . insulin glargine  15 Units Subcutaneous QHS  . lisinopril  20 mg Oral Daily  . pantoprazole  40 mg Oral Daily  . pneumococcal 23 valent vaccine  0.5 mL Intramuscular Tomorrow-1000  . saccharomyces boulardii  250 mg Oral BID  . senna-docusate  2 tablet Oral BID  . sodium chloride flush  10-40 mL Intracatheter Q12H  . sodium chloride flush  3 mL Intravenous Q12H   Continuous Infusions: . sodium chloride 250 mL (04/13/19 2351)  . sodium chloride Stopped (04/13/19 2230)  . sodium chloride    . sodium chloride 100 mL/hr at 04/23/19 0951  . ceFTAROline (TEFLARO) IV 600 mg (05/02/19 1660)  . DAPTOmycin (CUBICIN)  IV 850 mg (05/01/19 2324)  . magnesium sulfate bolus IVPB        LOS: 19 days      Time spent: 25 minutes   Noralee Stain, DO [facility] 05/02/2019, 10:50 AM   Available via Epic secure chat 7am-7pm After these hours, please refer to coverage provider listed on [email or web address]

## 2019-05-02 NOTE — Plan of Care (Signed)
  Problem: Pain Managment: Goal: General experience of comfort will improve Outcome: Progressing   Problem: Safety: Goal: Ability to remain free from injury will improve Outcome: Progressing   Problem: Skin Integrity: Goal: Risk for impaired skin integrity will decrease Outcome: Progressing   

## 2019-05-02 NOTE — Progress Notes (Signed)
Report received from Tina, RN

## 2019-05-03 LAB — GLUCOSE, CAPILLARY
Glucose-Capillary: 189 mg/dL — ABNORMAL HIGH (ref 70–99)
Glucose-Capillary: 138 mg/dL — ABNORMAL HIGH (ref 70–99)
Glucose-Capillary: 182 mg/dL — ABNORMAL HIGH (ref 70–99)
Glucose-Capillary: 175 mg/dL — ABNORMAL HIGH (ref 70–99)
Glucose-Capillary: 169 mg/dL — ABNORMAL HIGH (ref 70–99)

## 2019-05-03 MED ORDER — LIDOCAINE 5 % EX PTCH
1.0000 | MEDICATED_PATCH | Freq: Every day | CUTANEOUS | Status: DC
  Administered 2019-05-03: 12:00:00 1 via TRANSDERMAL
  Filled 2019-05-03: qty 1

## 2019-05-03 MED ORDER — LIDOCAINE 5 % EX PTCH: 1.0000 | MEDICATED_PATCH | Freq: Every day | CUTANEOUS | 0 refills | Status: DC

## 2019-05-03 MED ORDER — HEPARIN SOD (PORK) LOCK FLUSH 100 UNIT/ML IV SOLN
250.0000 [IU] | INTRAVENOUS | Status: AC | PRN
  Administered 2019-05-03: 21:00:00 250 [IU]
  Filled 2019-05-03: qty 2.5

## 2019-05-03 MED FILL — LIDOCAINE PATCH 5%: 5 | 30 days supply | Qty: 30 | Fill #0

## 2019-05-03 MED FILL — traMADol HCL 50 MG TABS: 50 | 5 days supply | Qty: 10 | Fill #0

## 2019-05-03 MED FILL — oxyCODONE HCL 5 MG TABS: 5 | 5 days supply | Qty: 40 | Fill #0

## 2019-05-03 MED FILL — ATORVASTATIN CALCIUM 10 MG: 10 | 30 days supply | Qty: 30 | Fill #0

## 2019-05-03 MED FILL — CLOPIDOGREL 75 MG TABLET: 75 | 30 days supply | Qty: 30 | Fill #0

## 2019-05-03 NOTE — Progress Notes (Signed)
  PROGRESS NOTE  Patient to discharge today. See today's progress note and updated discharge summary. Home health as well as transportation to short stay for daily IV antibiotics arranged by CM.   Noralee Stain, DO [facility] 05/03/2019, 3:49 PM  Available via Epic secure chat 7am-7pm After these hours, please refer to coverage provider listed on [email or web address]

## 2019-05-03 NOTE — Plan of Care (Signed)
  Problem: Pain Managment: Goal: General experience of comfort will improve Outcome: Progressing   Problem: Safety: Goal: Ability to remain free from injury will improve Outcome: Progressing   Problem: Skin Integrity: Goal: Risk for impaired skin integrity will decrease Outcome: Progressing   

## 2019-05-03 NOTE — Progress Notes (Signed)
Pharmacy Antibiotic Note  Thomas [name] is a 44 y.o. male admitted on 04/13/2019 with Progressive MRSA bacteremia/diskitis and dry gangrene of toes.  Pharmacy has been consulted for Teflar and Dapto dosing.  ID: Completed a long course of dapto on 04/04/2019 for MRSA bacteremia/diskitis. aspiration of T9 abscess by IR on 04/15/2019. Progressing.  Afeb, wbc wnl -CT showing worsening destruction of T9 spine > no emergent need for intervention per neurosurg - not good surgical candidate -TTE negative for vegetations -Planning 8 wk course if able to afford thru 06/17/19  Vancomycin 12/29>>1/7 Cefepime 12/29>>12/30 Metronidazole 12/29>>12/30 Daptomycin 1/7>> - CK 31 (1/16) << 40 (1/9) Ceftaroline 1/7>>  12/29 COVID: Negative 12/29 Bcx>>MRSA 1/2 12/29 Bcx>> 2/2 MRSA 12/29 Ucx: Neg 12/30 MRSA PCR: Positive 12/30 Bcx: GPC 12/31 Would culture (bone biopsy): MRSA 1/4 blood cultures: negF    Plan: - Ceftaroline 600mg  IV q 8 hrs x 8 wks through 06/17/19. - Daptomycin 850mg  IV q24h x 8 wks - Weekly CK on Sat     Height: 5\' 10"  (177.8 cm) Weight: 193 lb 11.2 oz (87.9 kg) IBW/kg (Calculated) : 73  Temp (24hrs), Avg:98.1 F (36.7 C), Min:97.6 F (36.4 C), Max:98.8 F (37.1 C)  Recent Labs  Lab 04/27/19 0500 04/27/19 0500 04/28/19 0500 04/29/19 0500 04/30/19 0617 05/01/19 0418 05/02/19 0339  WBC 5.6   < > 5.6 7.1 7.3 5.8 7.2  CREATININE 0.75  --   --   --   --  0.95  --    < > = values in this interval not displayed.    Estimated Creatinine Clearance: 113.2 mL/min (by C-G formula based on SCr of 0.95 mg/dL).    No Known Allergies   Thomas [name] S. 05/02/19, PharmD, BCPS Clinical Staff Pharmacist [email or web address]  05/03/19 05/03/2019 9:18 AM

## 2019-05-03 NOTE — TOC Transition Note (Addendum)
Transition of Care [facility]) - CM/SW Discharge Note   Patient Details  Name: Thomas [name] MRN: [id number] Date of Birth: [birth year]  [facility]) CM/SW Contact:  Epifanio Lesches, RN Phone Number: [phone] 05/03/2019, 5:30 PM   Clinical Narrative:    NCM spoke with pt @ bedside regarding POC with [employer]. Pt is to transition to home today with home IV ABX infusion/ Teflaro  ( Pam/ [employer] to come today and teach pt and friend Donette Larry). Pt is to also  f/u @  [facility] short stay for  Dapto/ IV abx therapy daily for 8 am infusion. [facility] services [phone]) are  in place to provide pt transportation  to and from Short  Stay visits , pt without transportation.  Pt's friend Donette Larry to provide transportation to home. TOC pharmacy to provide Rx meds to bedside prior to d/c.    Final next level of care: Home w Home Health Services Barriers to Discharge: No Barriers Identified   Patient Goals and CMS Choice Patient states their goals for this hospitalization and ongoing recovery are:: return home   Choice offered to / list presented to : NA  Discharge Placement                       Discharge Plan and Services   Discharge Planning Services: Medication Assistance, CM Consult       HH Arranged: IV Antibiotics HH Agency: [facility] Date [facility] Agency Contacted: 04/26/19   Representative spoke with at [facility] Agency: Pam  Social Determinants of Health (SDOH) Interventions     Readmission Risk Interventions No flowsheet data found.

## 2019-05-03 NOTE — TOC Progression Note (Addendum)
Transition of Care [facility]) - Progression Note    Patient Details  Name: Thomas [name] MRN: [id number] Date of Birth: 03-05-1976  Transition of Care [facility]) CM/SW Contact  Epifanio Lesches, RN Phone Number: [phone] 05/03/2019, 10:11 AM  Clinical Narrative:    NCM @ bedside with Pam ( [employer] / IV infusion) and  [employer] Shara # [id number] to discuss d/c plan for today. Pt will need IV ABX therapy x 6 weeks with end date 3/4.  Pt  will be taught how to administer the Telflaro by Elita Quick / [employer] and pt will receive dapto from [facility] Stay. Per Pam made aware d/c plan on Fri. and @ that time pt had communicated he had 2 friends to help him. Now pt states he can't go home he has no one to help him, his friends work,  his back hurts and he will not sign d/c papers if given. States also he has no  transport  to short stay for his Dapto infusion.  NCM shared information with [facility] supervisor and MD ....TOC supervisor to f/u with NCM.   Expected Discharge Plan: Home w Home Health Services Barriers to Discharge: Other (comment)(transportation issues with getting to short stay daily for Dapto infusion)  Expected Discharge Plan and Services Expected Discharge Plan: Home w Home Health Services   Discharge Planning Services: Medication Assistance, CM Consult   Living arrangements for the past 2 months: Apartment                             Representative spoke with at [facility] Agency: [facility]   Social Determinants of Health (SDOH) Interventions    Readmission Risk Interventions No flowsheet data found.

## 2019-05-03 NOTE — Discharge Instructions (Signed)
[facility]  Discharge instructions  Lower Extremity Bypass Surgery  Please refer to the following instruction for your post-procedure care. Your surgeon or physician assistant will discuss any changes with you.  Activity  You are encouraged to walk as much as you can. You can slowly return to normal activities during the month after your surgery. Avoid strenuous activity and heavy lifting until your doctor tells you it's OK. Avoid activities such as vacuuming or swinging a golf club. Do not drive until your doctor give the OK and you are no longer taking prescription pain medications. It is also normal to have difficulty with sleep habits, eating and bowel movement after surgery. These will go away with time.  Bathing/Showering  Shower daily after you go home. Do not soak in a bathtub, hot tub, or swim until the incision heals completely.  Incision Care  Clean your incision with mild soap and water. Shower every day. Pat the area dry with a clean towel. You do not need a bandage unless otherwise instructed. Do not apply any ointments or creams to your incision. If you have open wounds you will be instructed how to care for them or a visiting nurse may be arranged for you. If you have staples or sutures along your incision they will be removed at your post-op appointment. You may have skin glue on your incision. Do not peel it off. It will come off on its own in about one week.  Wash the groin wound with soap and water daily and pat dry. (No tub bath-only shower)  Then put a dry gauze or washcloth in the groin to keep this area dry to help prevent wound infection.  Do this daily and as needed.  Do not use Vaseline or neosporin on your incisions.  Only use soap and water on your incisions and then protect and keep dry.  Diet  Resume your normal diet. There are no special food restrictions following this procedure. A low fat/ low cholesterol diet is  recommended for all patients with vascular disease. In order to heal from your surgery, it is CRITICAL to get adequate nutrition. Your body requires vitamins, minerals, and protein. Vegetables are the best source of vitamins and minerals. Vegetables also provide the perfect balance of protein. Processed food has little nutritional value, so try to avoid this.  Medications  Resume taking all your medications unless your doctor or physician assistant tells you not to. If your incision is causing pain, you may take over-the-counter pain relievers such as acetaminophen (Tylenol). If you were prescribed a stronger pain medication, please aware these medication can cause nausea and constipation. Prevent nausea by taking the medication with a snack or meal. Avoid constipation by drinking plenty of fluids and eating foods with high amount of fiber, such as fruits, vegetables, and grains. Take Colace 100 mg (an over-the-counter stool softener) twice a day as needed for constipation.  Do not take Tylenol if you are taking prescription pain medications.  Follow Up  Our office will schedule a follow up appointment 2-3 weeks following discharge.  Please call us immediately for any of the following conditions  .Severe or worsening pain in your legs or feet while at rest or while walking .Increase pain, redness, warmth, or drainage (pus) from your incision site(s) Fever of 101 degree or higher The swelling in your leg with the bypass suddenly worsens and becomes more painful than when you were in the hospital If you have  been instructed to feel your graft pulse then you should do so every day. If you can no longer feel this pulse, call the office immediately. Not all patients are given this instruction.  Leg swelling is common after leg bypass surgery.  The swelling should improve over a few months following surgery. To improve the swelling, you may elevate your legs above the level of your heart while you are  sitting or resting. Your surgeon or physician assistant may ask you to apply an ACE wrap or wear compression (TED) stockings to help to reduce swelling.  Reduce your risk of vascular disease  Stop smoking. If you would like help call [facility] at 1-800-QUIT-NOW ((714)215-0916) or [place] at ([phone] [phone].  Manage your cholesterol Maintain a desired weight Control your diabetes weight Control your diabetes Keep your blood pressure down  If you have any questions, please call the office at [phone]     Information on my medicine - ELIQUIS (apixaban)  Why was Eliquis prescribed for you? Eliquis was prescribed to treat blood clots that may have been found in the veins of your legs (deep vein thrombosis) or in your lungs (pulmonary embolism) and to reduce the risk of them occurring again.  What do You need to know about Eliquis ? The dose is ONE 5 mg tablet taken TWICE daily.  Eliquis may be taken with or without food.   Try to take the dose about the same time in the morning and in the evening. If you have difficulty swallowing the tablet whole please discuss with your pharmacist how to take the medication safely.  Take Eliquis exactly as prescribed and DO NOT stop taking Eliquis without talking to the doctor who prescribed the medication.  Stopping may increase your risk of developing a new blood clot.  Refill your prescription before you run out.  After discharge, you should have regular check-up appointments with your healthcare provider that is prescribing your Eliquis.    What do you do if you miss a dose? If a dose of ELIQUIS is not taken at the scheduled time, take it as soon as possible on the same day and twice-daily administration should be resumed. The dose should not be doubled to make up for a missed dose.  Important Safety Information A possible side effect of Eliquis is bleeding. You should call your healthcare provider right away if you experience any  of the following: ? Bleeding from an injury or your nose that does not stop. ? Unusual colored urine (red or dark brown) or unusual colored stools (red or black). ? Unusual bruising for unknown reasons. ? A serious fall or if you hit your head (even if there is no bleeding).  Some medicines may interact with Eliquis and might increase your risk of bleeding or clotting while on Eliquis. To help avoid this, consult your healthcare provider or pharmacist prior to using any new prescription or non-prescription medications, including herbals, vitamins, non-steroidal anti-inflammatory drugs (NSAIDs) and supplements.  This website has more information on Eliquis (apixaban): http://www.eliquis.com/eliquis/home

## 2019-05-03 NOTE — Telephone Encounter (Signed)
Patient is in the hospital, friend stated he should be out either today or this week. Advised me to contact later on the schedule

## 2019-05-03 NOTE — Progress Notes (Signed)
PROGRESS NOTE    [name]  XIP:[id number] DOB: 21-Oct-1975 DOA: 04/13/2019 PCP: [name], PA-C     Brief Narrative:  Thomas [name] is a 44 y.o.malewith medical history significant ofDM2 poorly controlled.Recent hospitalization at [facility] for disseminated MRSA bacteremia, osteomyelitis and discitis. Completed 6 weeks course of IV antibiotics on April 04, 2019. Source of bacteremia thought to be from skin source (per previous facility report, area on toe that appeared cellulitic, area in R back that was erythematous and an abrasion of the lateral R aspect of his shin, abscess formation in the R calf and pyomyositis of the L vastus intermedius).Developed necrosis of his R 1-5 digits sincehisdischarge.  Presentedherewith worsening back pain, generalized weakness, fever and recent fall. CT chest, abdomen and pelvis notable for findings of advanced osteomyelitis involving T9 as well as discitis.Positive blood cultures for MRSA. Seen by infectious disease,recommendations for IV Vancomycin. Pt underwent TEE on 04/19/2019 which did not demonstrate vegetations or other evidence of persistent endocarditis.   He was also taken to the vascular lab by Dr. Chestine Spore for Aortogram, bilateral lower extremity arteriogram with runoff, right external iliac artery angioplasty with stent placement under moderate conscious sedation. This was performed for critical ischemia of the right lower extremity with gangrene of digits 1-5 of the right foot. Dr. Chestine Spore performed a right SFA to TP trunk bypass for popliteal occlusion in addition to iliac lesion on 04/22/2019. The patient apparently developed a thigh hematoma and received transfusion of 2 units PRBC.  Neurosurgery was reconsulted on 04/24/2019 for re-evaluation of thoracic spine discitis due to concern for persistent infection in this area. I appreciate neurosurgery's assistance. Dr. [name] has continued to recommend medical  management of the patient's T8-T-10 osteomyelitis/diskitis. The patient is a poor surgical candidate. TLSO brace has been ordered.  New events last 24 hours / Subjective: Patient evaluated with iPad interpreter. Wants to walk in the hallway. Has been ambulating around the room with a walker. No new complaints, still having back pain and some RLE swelling around incision sites.   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 New discitis at T8-9 and T9-T10 with T8 and T10 osteomyelitis. Moderate to severe spinal stenosis at T8-T9. Persistent extensive anterior paravertebral soft tissue inflammation/phlegmon. Decreased posterior paraspinal inflammation without residual or new abscess. -Status post aspiration of T9 abscess by IR on 04/15/2019.  Deep tissue culture growing staph aureus -Status post TEE 04/18/2018 and demonstrated no vegetations or evidence of persistent endocarditis -Neurosurgery, Dr. [name], has continued to recommend medical management of patient's T8-T-10 osteomyelitis/diskitis. The patient is a poor surgical candidate. Surgical approach will be withheld pending neurologic decline related to stenosis or subacute/chronic development of significant kyphotic deformity requiring stabilization. TLSO brace has been ordered -Repeat blood culture 04/19/2019 negative -Infectious disease following.  Continue IV Cetaroline and Daptomycin for a total of 8 weeks. Last day of therapy will be  06/16/2019  Peripheral artery disease -Right first through fifth toes dry gangrene.  Status post bilateral arteriogram with bilateral  runoff and stent placement in right EIA. The patient returned to vascular lab on 04/22/2019 for right SFA to TP trunk bypass for popliteal occlusion in addition to iliac lesion -Continue Plavix  History of DVT -Eliquis  Essential hypertension -HCTZ, lisinopril  Hyperlipidemia -Lipitor  Possible rectal mass -Will need GI evaluation when more stable and bacteremia has cleared  Type 2 diabetes with hyperglycemia and diabetic neuropathy -Hemoglobin A1c8.3 on 04/14/2019 -Continue Lantus, sliding scale insulin -Resume gabapentin   DVT prophylaxis: Eliquis Code Status: Full Family Communication: None at bedside Disposition Plan: Discharge home with home health once outpatient IV antibiotic administration issues cleared up   Consultants:   Vascular surgery  Interventional radiology  Infectious disease  Wound Care  Neurosurgery   Antimicrobials:  Anti-infectives (From admission, onward)   Start     Dose/Rate Route Frequency Ordered Stop   04/30/19 0000  ceftaroline (TEFLARO) IVPB     600 mg Intravenous Every 8 hours 04/30/19 1042 06/18/19 2359   04/30/19 0000  daptomycin (CUBICIN) IVPB     850 mg Intravenous Every 24 hours 04/30/19 1042 06/18/19 2359   04/22/19 2000  DAPTOmycin (CUBICIN) 850 mg in sodium chloride 0.9 % IVPB     850 mg 234 mL/hr over 30 Minutes Intravenous Daily 04/22/19 1652     04/22/19 1700  ceftaroline (TEFLARO) 600 mg in sodium chloride 0.9 % 250 mL IVPB     600 mg 250 mL/hr over 60 Minutes Intravenous Every 8 hours 04/22/19 1652     04/22/19 1600  ceFAZolin (ANCEF) IVPB 2g/100 mL premix     2 g 200 mL/hr over 30 Minutes Intravenous Every 8 hours 04/22/19 1525 04/23/19 0217   04/22/19 0600  ceFAZolin (ANCEF) IVPB 2g/100 mL premix    Note to Pharmacy: Send with pt to OR   2 g 200 mL/hr over 30 Minutes  Intravenous To Short Stay 04/21/19 0743 04/22/19 0813   04/18/19 1800  vancomycin (VANCOCIN) IVPB 1000 mg/200 mL premix  Status:  Discontinued     1,000 mg 200 mL/hr over 60 Minutes Intravenous Every 12 hours 04/18/19 0718 04/22/19 1652   04/14/19 1600  vancomycin (VANCOREADY) IVPB 1500 mg/300 mL  Status:  Discontinued     1,500 mg 150 mL/hr over 120 Minutes Intravenous Every 12 hours 04/14/19 1240 04/18/19 0718   04/13/19 2200  ceFEPIme (MAXIPIME) 2 g in sodium chloride 0.9 % 100 mL IVPB  Status:  Discontinued     2 g 200 mL/hr over 30 Minutes Intravenous Every 8 hours 04/13/19 1428 04/14/19 1028   04/13/19 1430  vancomycin (VANCOCIN) IVPB 1000 mg/200 mL premix  Status:  Discontinued     1,000 mg 200 mL/hr over 60 Minutes Intravenous Every 12 hours 04/13/19 1428 04/14/19 1240   04/13/19 1415  ceFEPIme (MAXIPIME) 2 g in sodium chloride 0.9 % 100 mL IVPB     2 g 200 mL/hr over 30 Minutes Intravenous  Once 04/13/19 1411 04/13/19 1505   04/13/19 1415  metroNIDAZOLE (FLAGYL) IVPB 500 mg     500 mg 100 mL/hr over 60 Minutes Intravenous  Once 04/13/19 1411 04/13/19 1600   04/13/19 1415  vancomycin (VANCOCIN) IVPB 1000 mg/200 mL premix  Status:  Discontinued     1,000 mg 200 mL/hr over 60 Minutes Intravenous  Once 04/13/19 1411 04/13/19 1428       Objective: Vitals:   05/02/19 1500 05/02/19 1916 05/03/19 0449 05/03/19 0841  BP: 128/82 122/78 123/68 130/80  Pulse: 91 91 84 98  Resp: 18   17  Temp: 97.6 F (36.4 C) 98.1 F (36.7 C) 97.8 F (36.6 C) 98.8 F (37.1 C)  TempSrc: Oral Oral Oral Oral  SpO2: 100% 100% 100% 100%  Weight:      Height:        Intake/Output Summary (Last 24 hours) at 05/03/2019 0950 Last data filed at 05/03/2019 0900 Gross per 24 hour  Intake 2515.18 ml  Output 1500 ml  Net 1015.18 ml   Filed Weights   04/20/19 0412 04/21/19 0308 04/22/19 0406  Weight: 88.3 kg 87.4 kg 87.9 kg     Examination: General exam: Appears calm and comfortable  Respiratory  system: Clear to auscultation. Respiratory effort normal. Cardiovascular system: S1 & S2 heard, RRR. No pedal edema. Gastrointestinal system: Abdomen is nondistended, soft and nontender. Normal bowel sounds heard. Central nervous system: Alert and oriented. Non focal exam. Speech clear  Extremities: Symmetric in appearance bilaterally  Skin: RLE with incision sites dry and clean, likely underlying hematoma present  Psychiatry: Judgement and insight appear stable. Mood & affect appropriate.     Data Reviewed: I have personally reviewed following labs and imaging studies  CBC: Recent Labs  Lab 04/27/19 0500 04/27/19 0500 04/28/19 0500 04/29/19 0500 04/30/19 0617 05/01/19 0418 05/02/19 0339  WBC 5.6   < > 5.6 7.1 7.3 5.8 7.2  NEUTROABS 3.5  --  3.6  --   --   --   --   HGB 7.2*   < > 7.2* 7.6* 7.3* 7.0* 7.5*  HCT 23.0*   < > 23.7* 24.7* 24.1* 23.1* 24.5*  MCV 81.0   < > 81.2 82.1 83.1 82.5 81.4  PLT 497*   < > 571* 563* 555* 507* 582*   < > = values in this interval not displayed.   Basic Metabolic Panel: Recent Labs  Lab 04/27/19 0500 05/01/19 0418  NA 138 133*  K 3.6 3.8  CL 104 99  CO2 25 27  GLUCOSE 146* 212*  BUN 6 10  CREATININE 0.75 0.95  CALCIUM 8.4* 8.4*   GFR: Estimated Creatinine Clearance: 113.2 mL/min (by C-G formula based on SCr of 0.95 mg/dL). Liver Function Tests: No results for input(s): AST, ALT, ALKPHOS, BILITOT, PROT, ALBUMIN in the last 168 hours. No results for input(s): LIPASE, AMYLASE in the last 168 hours. No results for input(s): AMMONIA in the last 168 hours. Coagulation Profile: No results for input(s): INR, PROTIME in the last 168 hours. Cardiac Enzymes: Recent Labs  Lab 05/01/19 0418  CKTOTAL 31*   BNP (last 3 results) No results for input(s): PROBNP in the last 8760 hours. HbA1C: No results for input(s): HGBA1C in the last 72 hours. CBG: Recent Labs  Lab 05/02/19 0637 05/02/19 1202 05/02/19 1538 05/02/19 2129  05/03/19 0636  GLUCAP 116* 143* 156* 120* 138*   Lipid Profile: No results for input(s): CHOL, HDL, LDLCALC, TRIG, CHOLHDL, LDLDIRECT in the last 72 hours. Thyroid Function Tests: No results for input(s): TSH, T4TOTAL, FREET4, T3FREE, THYROIDAB in the last 72 hours. Anemia Panel: No results for input(s): VITAMINB12, FOLATE, FERRITIN, TIBC, IRON, RETICCTPCT in the last 72 hours. Sepsis Labs: No results for input(s): PROCALCITON, LATICACIDVEN in the last 168 hours.  No results found for this or any previous visit (from the past 240 hour(s)).    Radiology Studies: No results found.    Scheduled Meds: . apixaban  5 mg Oral BID  . atorvastatin  10 mg Oral q1800  . Chlorhexidine Gluconate Cloth  6  each Topical Daily  . clopidogrel  75 mg Oral Q breakfast  . docusate sodium  100 mg Oral Daily  . gabapentin  100 mg Oral QHS  . hydrochlorothiazide  25 mg Oral Daily  . insulin aspart  0-9 Units Subcutaneous TID WC  . insulin glargine  15 Units Subcutaneous QHS  . lisinopril  20 mg Oral Daily  . pantoprazole  40 mg Oral Daily  . pneumococcal 23 valent vaccine  0.5 mL Intramuscular Tomorrow-1000  . saccharomyces boulardii  250 mg Oral BID  . senna-docusate  2 tablet Oral BID  . sodium chloride flush  10-40 mL Intracatheter Q12H  . sodium chloride flush  3 mL Intravenous Q12H   Continuous Infusions: . sodium chloride 250 mL (04/13/19 2351)  . sodium chloride Stopped (04/13/19 2230)  . sodium chloride    . ceFTAROline (TEFLARO) IV 600 mg (05/03/19 0915)  . DAPTOmycin (CUBICIN)  IV 850 mg (05/02/19 2050)  . magnesium sulfate bolus IVPB       LOS: 20 days      Time spent: 25 minutes   Dessa Phi, DO [facility] 05/03/2019, 9:50 AM   Available via Epic secure chat 7am-7pm After these hours, please refer to coverage provider listed on [email or web address]

## 2019-05-03 NOTE — Progress Notes (Signed)
Physical Therapy Treatment Patient Details Name: Thomas [name] MRN: [id number] DOB: Oct 25, 1975 Today's Date: 05/03/2019    History of Present Illness Patient is a 44 y/o male who presents with back pain, dizziness and fall at home. Recent long hospital stay at [facility] for disseminated MRSA bacteremia, osteomyelitis and discitis. Chest CT, abdomen and pelvis notable for findings of advanced osteomyelitis involving T9 as well as discitis T8-9, T9-10. s/p successful fluoro guided T9 vertebral body aspiration and biopsy 12/31. Positive blood cultures for MRSA. Also admitted for necrosis of right toes 1-5. s/p Right pop-TP trunk bypass with RSVG 04/22/19.    PT Comments    Continuing work on functional mobility and activity tolerance;  Thomas [name] is showing good improvements with functional mobility, especially when compared to PT session early in this hospital stay; Walked in the hallway, and completed stair training today; Noted transportation difficluties, and appreciate the work of our [employer];  [place], [place] [phone], facilitated communication today   Follow Up Recommendations  Home health PT;Supervision - Intermittent     Equipment Recommendations  Rolling walker with 5" wheels(Does he have one already?)    Recommendations for Other Services       Precautions / Restrictions Precautions Precautions: Fall Precaution Comments: R LE incisions, declined brace- says it did not help his pain    Mobility  Bed Mobility               General bed mobility comments: noted he has been independent with bed mobility in previous sessions  Transfers Overall transfer level: Needs assistance Equipment used: Rolling walker (2 wheeled) Transfers: Sit to/from Stand Sit to Stand: Supervision         General transfer comment: cues for safe hand placement  Ambulation/Gait Ambulation/Gait assistance: Supervision Gait Distance (Feet): 80 Feet Assistive device:  Rolling walker (2 wheeled) Gait Pattern/deviations: Step-through pattern;Decreased stride length     General Gait Details: supervision for safety   Stairs Stairs: Yes Stairs assistance: Min guard(with and without physical contact) Stair Management: One rail Right;Step to pattern;Sideways Number of Stairs: 5 General stair comments: cues for sequence; good use of rails   Wheelchair Mobility    Modified Rankin (Stroke Patients Only)       Balance Overall balance assessment: Needs assistance;History of Falls Sitting-balance support: Feet supported Sitting balance-Leahy Scale: Good       Standing balance-Leahy Scale: Fair                              Cognition Arousal/Alertness: Awake/alert Behavior During Therapy: WFL for tasks assessed/performed Overall Cognitive Status: Within Functional Limits for tasks assessed                                        Exercises      General Comments        Pertinent Vitals/Pain Pain Assessment: 0-10 Pain Score: 8  Faces Pain Scale: Hurts whole lot Pain Location: Back and RLE, especially with transitions Pain Descriptors / Indicators: Grimacing;Guarding Pain Intervention(s): Monitored during session;Heat applied;Other (comment)(RN applied pain patch)    Home Living                      Prior Function            PT Goals (current goals can now be found  in the care plan section) Acute Rehab PT Goals Patient Stated Goal: to walk more so he can go home PT Goal Formulation: With patient Time For Goal Achievement: 05/03/19 Potential to Achieve Goals: Good Progress towards PT goals: Progressing toward goals    Frequency    Min 3X/week      PT Plan Current plan remains appropriate    Co-evaluation PT/OT/SLP Co-Evaluation/Treatment: ([place] for Interrpeter) Reason for Co-Treatment: Other (comment)(for smoother use of interpreter) PT goals addressed during session:  Mobility/safety with mobility OT goals addressed during session: ADL's and self-care      AM-PAC PT "6 Clicks" Mobility   Outcome Measure  Help needed turning from your back to your side while in a flat bed without using bedrails?: None Help needed moving from lying on your back to sitting on the side of a flat bed without using bedrails?: None Help needed moving to and from a bed to a chair (including a wheelchair)?: A Little Help needed standing up from a chair using your arms (e.g., wheelchair or bedside chair)?: None Help needed to walk in hospital room?: A Little Help needed climbing 3-5 steps with a railing? : A Little 6 Click Score: 21    End of Session Equipment Utilized During Treatment: Gait belt Activity Tolerance: Patient tolerated treatment well Patient left: in chair;with call bell/phone within reach;with chair alarm set Nurse Communication: Mobility status;Patient requests pain meds PT Visit Diagnosis: Pain;Muscle weakness (generalized) (M62.81) Pain - Right/Left: Right Pain - part of body: Leg(back)     Time: 2025-4270 PT Time Calculation (min) (ACUTE ONLY): 33 min  Charges:  $Gait Training: 8-22 mins                     Van Clines, PT  Acute Rehabilitation Services Pager [phone] Office [phone]    Levi Aland 05/03/2019, 1:29 PM

## 2019-05-03 NOTE — Progress Notes (Signed)
Occupational Therapy Treatment Patient Details Name: Thomas [name] MRN: [id number] DOB: 1976-02-22 Today's Date: 05/03/2019    History of present illness Patient is a 44 y/o male who presents with back pain, dizziness and fall at home. Recent long hospital stay at [facility] for disseminated MRSA bacteremia, osteomyelitis and discitis. Chest CT, abdomen and pelvis notable for findings of advanced osteomyelitis involving T9 as well as discitis T8-9, T9-10. s/p successful fluoro guided T9 vertebral body aspiration and biopsy 12/31. Positive blood cultures for MRSA. Also admitted for necrosis of right toes 1-5. s/p Right pop-TP trunk bypass with RSVG 04/22/19.   OT comments  Pt making good progress with functional goals. Video interpreter used throughout session. Pt using RW for mobility at sup level for ADL transfers and functional mobility.  Reviewed all A/E tools in kit for LB selfcare for home use and pt able to return demo. OT will continue to follow  Follow Up Recommendations  Home health OT;Supervision/Assistance - 24 hour    Equipment Recommendations  Other (comment);3 in 1 bedside commode(A/E)    Recommendations for Other Services      Precautions / Restrictions Precautions Precautions: Fall Precaution Comments: R LE incisions, declined brace- says it did not help his pain Restrictions Weight Bearing Restrictions: No       Mobility Bed Mobility               General bed mobility comments: pt in recliner upon arrival  Transfers Overall transfer level: Needs assistance Equipment used: Rolling walker (2 wheeled) Transfers: Sit to/from Stand Sit to Stand: Supervision         General transfer comment: cues for safe hand placement    Balance Overall balance assessment: Needs assistance;History of Falls Sitting-balance support: Feet supported Sitting balance-Leahy Scale: Good Sitting balance - Comments: supervision   Standing balance support: Bilateral  upper extremity supported;During functional activity Standing balance-Leahy Scale: Fair                             ADL either performed or assessed with clinical judgement   ADL Overall ADL's : Needs assistance/impaired Eating/Feeding: Set up;Sitting   Grooming: Wash/dry hands;Wash/dry face;Supervision/safety;Set up;Standing   Upper Body Bathing: Set up;Sitting   Lower Body Bathing: Supervison/ safety;Set up Lower Body Bathing Details (indicate cue type and reason): reviewed using LH sponge for LB bathing and pt able to return demo Upper Body Dressing : Set up;Sitting   Lower Body Dressing: Sitting/lateral leans;With adaptive equipment;Minimal assistance   Toilet Transfer: Supervision/safety;RW;Ambulation;Cueing for safety Toilet Transfer Details (indicate cue type and reason): simulated via functional mobility with RW         Functional mobility during ADLs: Supervision/safety;Rolling walker;Cueing for safety General ADL Comments: reviewed all A/E tools in kit for LB selfcare for home use and pt able to return demo     Vision Patient Visual Report: No change from baseline     Perception     Praxis      Cognition Arousal/Alertness: Awake/alert Behavior During Therapy: WFL for tasks assessed/performed Overall Cognitive Status: Within Functional Limits for tasks assessed                                 General Comments: video interpreter used throughout session        Exercises     Shoulder Instructions       General Comments  Pertinent Vitals/ Pain       Pain Assessment: 0-10 Pain Score: 8  Faces Pain Scale: Hurts whole lot Pain Location: Back and RLE, especially with transitions Pain Descriptors / Indicators: Grimacing;Guarding Pain Intervention(s): Monitored during session;Repositioned;Heat applied;Other (comment)(RN applied pain patch to back)  Home Living                                           Prior Functioning/Environment              Frequency  Min 2X/week        Progress Toward Goals  OT Goals(current goals can now be found in the care plan section)  Progress towards OT goals: Progressing toward goals  Acute Rehab OT Goals Patient Stated Goal: to walk more so he can go home OT Goal Formulation: With patient  Plan Discharge plan remains appropriate    Co-evaluation      Reason for Co-Treatment: Other (comment)(for smoother use of interpreter) PT goals addressed during session: Mobility/safety with mobility OT goals addressed during session: ADL's and self-care      AM-PAC OT "6 Clicks" Daily Activity     Outcome Measure   Help from another person eating meals?: None Help from another person taking care of personal grooming?: None Help from another person toileting, which includes using toliet, bedpan, or urinal?: A Little Help from another person bathing (including washing, rinsing, drying)?: A Little Help from another person to put on and taking off regular upper body clothing?: None Help from another person to put on and taking off regular lower body clothing?: A Lot 6 Click Score: 20    End of Session Equipment Utilized During Treatment: Gait belt;Rolling walker;Other (comment)(A/E kit)  OT Visit Diagnosis: Unsteadiness on feet (R26.81);Other abnormalities of gait and mobility (R26.89);Muscle weakness (generalized) (M62.81)   Activity Tolerance Patient tolerated treatment well   Patient Left in chair;with call bell/phone within reach   Nurse Communication          Time: 0932-3557 OT Time Calculation (min): 30 min  Charges: OT General Charges $OT Visit: 1 Visit OT Treatments $Self Care/Home Management : 8-22 mins     Britt Bottom 05/03/2019, 2:41 PM

## 2019-05-04 ENCOUNTER — Telehealth: Payer: Self-pay | Admitting: *Deleted

## 2019-05-04 ENCOUNTER — Other Ambulatory Visit: Payer: Self-pay

## 2019-05-04 ENCOUNTER — Encounter (HOSPITAL_COMMUNITY)
Admission: RE | Admit: 2019-05-04 | Discharge: 2019-05-04 | Disposition: A | Discharge status: Home / Self Care | Payer: Medicaid Other | Source: Ambulatory Visit | Attending: Internal Medicine | Admitting: Internal Medicine

## 2019-05-04 DIAGNOSIS — M4620 Osteomyelitis of vertebra, site unspecified: Secondary | ICD-10-CM | POA: Diagnosis not present

## 2019-05-04 MED ORDER — SODIUM CHLORIDE 0.9 % IV SOLN
850.0000 mg | Freq: Once | INTRAVENOUS | Status: DC
  Administered 2019-05-04: 09:00:00 850 mg via INTRAVENOUS
  Filled 2019-05-04: qty 17

## 2019-05-04 MED ORDER — HEPARIN SOD (PORK) LOCK FLUSH 100 UNIT/ML IV SOLN
INTRAVENOUS | Status: AC
  Administered 2019-05-04: 09:00:00 250 [IU]
  Filled 2019-05-04: qty 5

## 2019-05-04 NOTE — Telephone Encounter (Signed)
Called pt's friend/ interpreter (Majeed --okay per DPR) to do hospital follow up because I was unable to reach pt directly. Majeed states he has been on the road for work and was unaware that pt had been discharged. Agrees to call his friend and call us back to set up follow up appt.

## 2019-05-05 ENCOUNTER — Other Ambulatory Visit: Payer: Self-pay

## 2019-05-05 ENCOUNTER — Encounter (HOSPITAL_COMMUNITY)
Admit: 2019-05-05 | Discharge: 2019-05-05 | Disposition: A | Discharge status: Home / Self Care | Payer: Medicaid Other | Attending: Family | Admitting: Family

## 2019-05-05 DIAGNOSIS — M4620 Osteomyelitis of vertebra, site unspecified: Secondary | ICD-10-CM | POA: Diagnosis not present

## 2019-05-05 MED ORDER — HEPARIN SOD (PORK) LOCK FLUSH 100 UNIT/ML IV SOLN
250.0000 [IU] | Freq: Every day | INTRAVENOUS | Status: DC
  Administered 2019-05-05: 250 [IU]

## 2019-05-05 MED ORDER — HEPARIN SOD (PORK) LOCK FLUSH 100 UNIT/ML IV SOLN
INTRAVENOUS | Status: AC
  Filled 2019-05-05: qty 5

## 2019-05-05 MED ORDER — SODIUM CHLORIDE 0.9 % IV SOLN
850.0000 mg | Freq: Every day | INTRAVENOUS | Status: DC
  Administered 2019-05-05: 09:00:00 850 mg via INTRAVENOUS
  Filled 2019-05-05: qty 17

## 2019-05-05 MED ORDER — HEPARIN SOD (PORK) LOCK FLUSH 100 UNIT/ML IV SOLN: 250.0000 [IU] | INTRAVENOUS | Status: DC | PRN

## 2019-05-05 NOTE — Telephone Encounter (Signed)
Unable to reach pt's friend back today, but I do see that he or pt scheduled hospital follow up w/ PCP 05/10/19.

## 2019-05-06 ENCOUNTER — Encounter (HOSPITAL_COMMUNITY)
Admit: 2019-05-06 | Discharge: 2019-05-06 | Disposition: A | Discharge status: Home / Self Care | Payer: Medicaid Other | Attending: Family | Admitting: Family

## 2019-05-06 ENCOUNTER — Other Ambulatory Visit: Payer: Self-pay

## 2019-05-06 DIAGNOSIS — M4620 Osteomyelitis of vertebra, site unspecified: Secondary | ICD-10-CM | POA: Diagnosis not present

## 2019-05-06 MED ORDER — SODIUM CHLORIDE 0.9 % IV SOLN
850.0000 mg | Freq: Every day | INTRAVENOUS | Status: DC
  Administered 2019-05-06: 850 mg via INTRAVENOUS
  Filled 2019-05-06: qty 17

## 2019-05-06 MED ORDER — HEPARIN SOD (PORK) LOCK FLUSH 100 UNIT/ML IV SOLN
INTRAVENOUS | Status: AC
  Administered 2019-05-06: 09:00:00 250 [IU]
  Filled 2019-05-06: qty 5

## 2019-05-06 MED ORDER — HEPARIN SOD (PORK) LOCK FLUSH 100 UNIT/ML IV SOLN: 250.0000 [IU] | Freq: Every day | INTRAVENOUS | Status: DC

## 2019-05-06 MED ORDER — HEPARIN SOD (PORK) LOCK FLUSH 100 UNIT/ML IV SOLN: 250.0000 [IU] | INTRAVENOUS | Status: DC | PRN

## 2019-05-07 ENCOUNTER — Encounter (HOSPITAL_COMMUNITY)
Admit: 2019-05-07 | Discharge: 2019-05-07 | Disposition: A | Discharge status: Home / Self Care | Payer: Medicaid Other | Attending: Family | Admitting: Family

## 2019-05-07 DIAGNOSIS — M4620 Osteomyelitis of vertebra, site unspecified: Secondary | ICD-10-CM | POA: Diagnosis not present

## 2019-05-07 MED ORDER — HEPARIN SOD (PORK) LOCK FLUSH 100 UNIT/ML IV SOLN: 250.0000 [IU] | Freq: Every day | INTRAVENOUS | Status: DC

## 2019-05-07 MED ORDER — SODIUM CHLORIDE 0.9 % IV SOLN
850.0000 mg | Freq: Every day | INTRAVENOUS | Status: DC
  Administered 2019-05-07: 850 mg via INTRAVENOUS
  Filled 2019-05-07: qty 17

## 2019-05-07 MED ORDER — HEPARIN SOD (PORK) LOCK FLUSH 100 UNIT/ML IV SOLN
INTRAVENOUS | Status: AC
  Filled 2019-05-07: qty 5

## 2019-05-07 MED ORDER — HEPARIN SOD (PORK) LOCK FLUSH 100 UNIT/ML IV SOLN: 250.0000 [IU] | INTRAVENOUS | Status: DC | PRN

## 2019-05-08 ENCOUNTER — Encounter (HOSPITAL_COMMUNITY)
Admit: 2019-05-08 | Discharge: 2019-05-08 | Disposition: A | Discharge status: Home / Self Care | Payer: Medicaid Other | Attending: Family | Admitting: Family

## 2019-05-08 ENCOUNTER — Telehealth: Payer: Self-pay

## 2019-05-08 ENCOUNTER — Other Ambulatory Visit: Payer: Self-pay

## 2019-05-08 DIAGNOSIS — M4620 Osteomyelitis of vertebra, site unspecified: Secondary | ICD-10-CM | POA: Diagnosis not present

## 2019-05-08 MED ORDER — HEPARIN SOD (PORK) LOCK FLUSH 100 UNIT/ML IV SOLN
250.0000 [IU] | INTRAVENOUS | Status: DC | PRN
  Administered 2019-05-08: 250 [IU]

## 2019-05-08 MED ORDER — SODIUM CHLORIDE 0.9 % IV SOLN
850.0000 mg | Freq: Every day | INTRAVENOUS | Status: DC
  Administered 2019-05-08: 850 mg via INTRAVENOUS
  Filled 2019-05-08: qty 17

## 2019-05-08 MED ORDER — HEPARIN SOD (PORK) LOCK FLUSH 100 UNIT/ML IV SOLN: 250.0000 [IU] | Freq: Every day | INTRAVENOUS | Status: DC

## 2019-05-08 NOTE — Telephone Encounter (Signed)
Received call from PACU RN, Lauren. Patient brought by [employer], however offices are closed on weekends, and phone number goes to [employer]. Per Lauren message was left and there has been no callback. I provided Lauren with a taxi voucher for patient to return home.

## 2019-05-09 ENCOUNTER — Emergency Department (HOSPITAL_COMMUNITY): Payer: Medicaid Other

## 2019-05-09 ENCOUNTER — Observation Stay (HOSPITAL_COMMUNITY)
Admission: EM | Admit: 2019-05-09 | Discharge: 2019-05-10 | Disposition: A | Discharge status: Home / Self Care | Payer: Medicaid Other | Attending: Vascular Surgery | Admitting: Vascular Surgery

## 2019-05-09 ENCOUNTER — Encounter (HOSPITAL_COMMUNITY)
Admit: 2019-05-09 | Discharge: 2019-05-09 | Disposition: A | Discharge status: Home / Self Care | Payer: Medicaid Other | Attending: Family | Admitting: Family

## 2019-05-09 ENCOUNTER — Encounter (HOSPITAL_COMMUNITY): Payer: Self-pay | Admitting: Emergency Medicine

## 2019-05-09 ENCOUNTER — Observation Stay (HOSPITAL_BASED_OUTPATIENT_CLINIC_OR_DEPARTMENT_OTHER): Payer: Medicaid Other

## 2019-05-09 ENCOUNTER — Other Ambulatory Visit: Payer: Self-pay

## 2019-05-09 DIAGNOSIS — I97638 Postprocedural hematoma of a circulatory system organ or structure following other circulatory system procedure: Secondary | ICD-10-CM | POA: Diagnosis not present

## 2019-05-09 DIAGNOSIS — Z794 Long term (current) use of insulin: Secondary | ICD-10-CM | POA: Insufficient documentation

## 2019-05-09 DIAGNOSIS — Z95820 Peripheral vascular angioplasty status with implants and grafts: Secondary | ICD-10-CM | POA: Diagnosis not present

## 2019-05-09 DIAGNOSIS — E785 Hyperlipidemia, unspecified: Secondary | ICD-10-CM | POA: Diagnosis not present

## 2019-05-09 DIAGNOSIS — Z87442 Personal history of urinary calculi: Secondary | ICD-10-CM | POA: Insufficient documentation

## 2019-05-09 DIAGNOSIS — I10 Essential (primary) hypertension: Secondary | ICD-10-CM | POA: Diagnosis not present

## 2019-05-09 DIAGNOSIS — F329 Major depressive disorder, single episode, unspecified: Secondary | ICD-10-CM | POA: Insufficient documentation

## 2019-05-09 DIAGNOSIS — T148XXA Other injury of unspecified body region, initial encounter: Secondary | ICD-10-CM | POA: Diagnosis present

## 2019-05-09 DIAGNOSIS — Z79899 Other long term (current) drug therapy: Secondary | ICD-10-CM | POA: Insufficient documentation

## 2019-05-09 DIAGNOSIS — Z7901 Long term (current) use of anticoagulants: Secondary | ICD-10-CM | POA: Diagnosis not present

## 2019-05-09 DIAGNOSIS — Z87891 Personal history of nicotine dependence: Secondary | ICD-10-CM | POA: Insufficient documentation

## 2019-05-09 DIAGNOSIS — Z20822 Contact with and (suspected) exposure to covid-19: Secondary | ICD-10-CM | POA: Diagnosis not present

## 2019-05-09 DIAGNOSIS — Y838 Other surgical procedures as the cause of abnormal reaction of the patient, or of later complication, without mention of misadventure at the time of the procedure: Secondary | ICD-10-CM | POA: Insufficient documentation

## 2019-05-09 DIAGNOSIS — E119 Type 2 diabetes mellitus without complications: Secondary | ICD-10-CM | POA: Insufficient documentation

## 2019-05-09 DIAGNOSIS — Z86718 Personal history of other venous thrombosis and embolism: Secondary | ICD-10-CM | POA: Insufficient documentation

## 2019-05-09 DIAGNOSIS — Z7982 Long term (current) use of aspirin: Secondary | ICD-10-CM | POA: Insufficient documentation

## 2019-05-09 DIAGNOSIS — M4620 Osteomyelitis of vertebra, site unspecified: Secondary | ICD-10-CM | POA: Diagnosis not present

## 2019-05-09 DIAGNOSIS — Z7902 Long term (current) use of antithrombotics/antiplatelets: Secondary | ICD-10-CM | POA: Insufficient documentation

## 2019-05-09 DIAGNOSIS — M7989 Other specified soft tissue disorders: Secondary | ICD-10-CM

## 2019-05-09 DIAGNOSIS — S8011XA Contusion of right lower leg, initial encounter: Secondary | ICD-10-CM | POA: Diagnosis not present

## 2019-05-09 LAB — CBC WITH DIFFERENTIAL/PLATELET
Abs Immature Granulocytes: 0.02 10*3/uL (ref 0.00–0.07)
Basophils Absolute: 0 10*3/uL (ref 0.0–0.1)
Basophils Relative: 1 %
Eosinophils Absolute: 0.2 10*3/uL (ref 0.0–0.5)
Eosinophils Relative: 3 %
HCT: 27.8 % — ABNORMAL LOW (ref 39.0–52.0)
Hemoglobin: 8.3 g/dL — ABNORMAL LOW (ref 13.0–17.0)
Immature Granulocytes: 0 %
Lymphocytes Relative: 26 %
Lymphs Abs: 1.6 10*3/uL (ref 0.7–4.0)
MCH: 24.3 pg — ABNORMAL LOW (ref 26.0–34.0)
MCHC: 29.9 g/dL — ABNORMAL LOW (ref 30.0–36.0)
MCV: 81.5 fL (ref 80.0–100.0)
Monocytes Absolute: 0.6 10*3/uL (ref 0.1–1.0)
Monocytes Relative: 10 %
Neutro Abs: 3.6 10*3/uL (ref 1.7–7.7)
Neutrophils Relative %: 60 %
Platelets: 545 10*3/uL — ABNORMAL HIGH (ref 150–400)
RBC: 3.41 MIL/uL — ABNORMAL LOW (ref 4.22–5.81)
RDW: 15.5 % (ref 11.5–15.5)
WBC: 6 10*3/uL (ref 4.0–10.5)
nRBC: 0 % (ref 0.0–0.2)

## 2019-05-09 LAB — COMPREHENSIVE METABOLIC PANEL
ALT: 18 U/L (ref 0–44)
AST: 14 U/L — ABNORMAL LOW (ref 15–41)
Albumin: 2.8 g/dL — ABNORMAL LOW (ref 3.5–5.0)
Alkaline Phosphatase: 67 U/L (ref 38–126)
Anion gap: 9 (ref 5–15)
BUN: 14 mg/dL (ref 6–20)
CO2: 24 mmol/L (ref 22–32)
Calcium: 8.9 mg/dL (ref 8.9–10.3)
Chloride: 103 mmol/L (ref 98–111)
Creatinine, Ser: 0.82 mg/dL (ref 0.61–1.24)
GFR calc Af Amer: >60 mL/min (ref >60)
GFR calc non Af Amer: >60 mL/min (ref >60)
Glucose, Bld: 104 mg/dL — ABNORMAL HIGH (ref 70–99)
Potassium: 3.8 mmol/L (ref 3.5–5.1)
Sodium: 136 mmol/L (ref 135–145)
Total Bilirubin: 0.3 mg/dL (ref 0.3–1.2)
Total Protein: 7.6 g/dL (ref 6.5–8.1)

## 2019-05-09 LAB — URINALYSIS, ROUTINE W REFLEX MICROSCOPIC
Bacteria, UA: NONE SEEN
Bilirubin Urine: NEGATIVE
Glucose, UA: NEGATIVE mg/dL
Hgb urine dipstick: NEGATIVE
Ketones, ur: NEGATIVE mg/dL
Leukocytes,Ua: NEGATIVE
Nitrite: NEGATIVE
Protein, ur: 30 mg/dL — AB
Specific Gravity, Urine: 1.02 (ref 1.005–1.030)
pH: 6 (ref 5.0–8.0)

## 2019-05-09 LAB — PROTIME-INR
INR: 1.1 (ref 0.8–1.2)
Prothrombin Time: 13.7 seconds (ref 11.4–15.2)

## 2019-05-09 LAB — LACTIC ACID, PLASMA: Lactic Acid, Venous: 1.1 mmol/L (ref 0.5–1.9)

## 2019-05-09 LAB — GLUCOSE, CAPILLARY
Glucose-Capillary: 215 mg/dL — ABNORMAL HIGH (ref 70–99)
Glucose-Capillary: 152 mg/dL — ABNORMAL HIGH (ref 70–99)

## 2019-05-09 LAB — SARS CORONAVIRUS 2 (TAT 6-24 HRS): SARS Coronavirus 2: NEGATIVE

## 2019-05-09 IMAGING — CT CT ANGIO EXTREM LOW*L*
2 of 10 series · 10 of 33 positions shown · IV contrast (APPLIED)
Comparison: CT abdomen [DATE]

CLINICAL DATA: 43-year-old with recent right lower extremity bypass
procedure. Patient presents with right lower extremity swelling and
bloody discharge from a drain site in the posterior calf.

EXAM:
CT ANGIOGRAPHY OF THE BILATERAL LOWER EXTREMITIES
TECHNIQUE: Multidetector CT imaging of the bilateral lower extremitieswas
performed using the standard protocol during bolus administration of
intravenous contrast. Multiplanar CT image reconstructions and MIPs
were obtained to evaluate the vascular anatomy.
CONTRAST:  100mL OMNIPAQUE IOHEXOL 350 MG/ML SOLN

[Series 5: arterial · axial · arterial · 0.53mm/px · z∈[-1098,-312]mm · 5 of 591 slices shown (1 of 2)]
[im 99/591  soft-tissue]
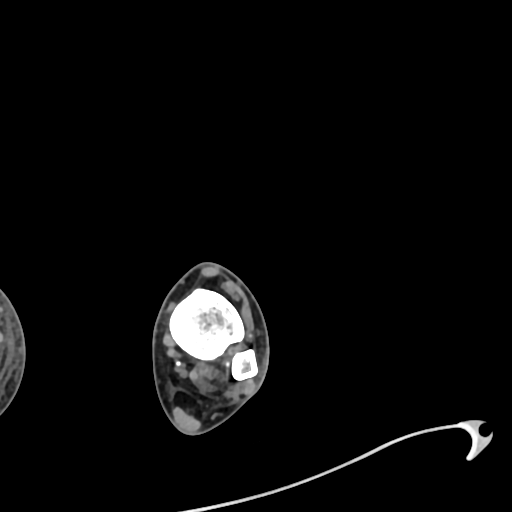
[im 197/591  bone]
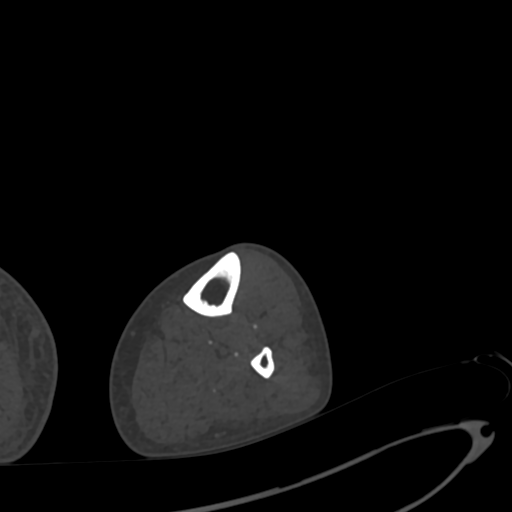
[im 296/591  soft-tissue]
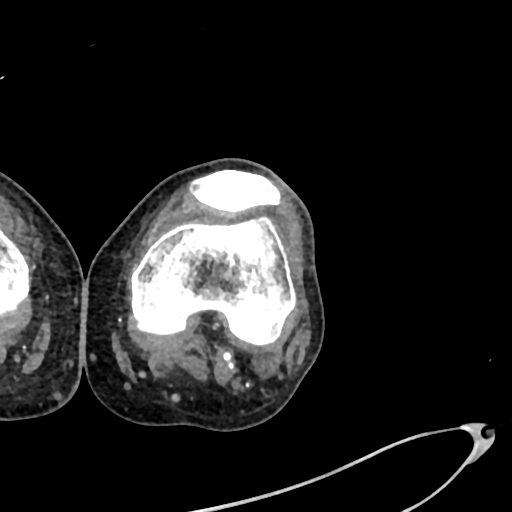
[im 394/591  bone]
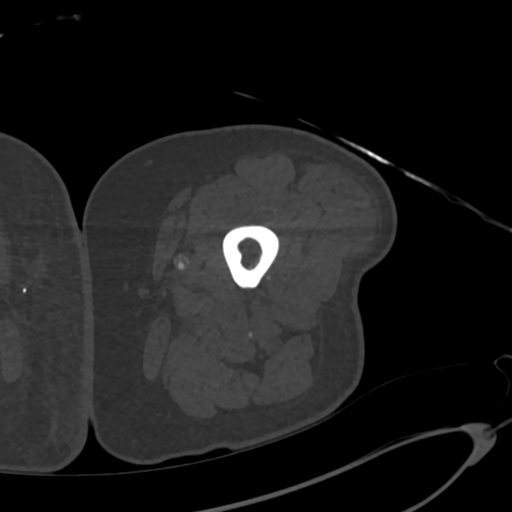
[im 492/591  soft-tissue]
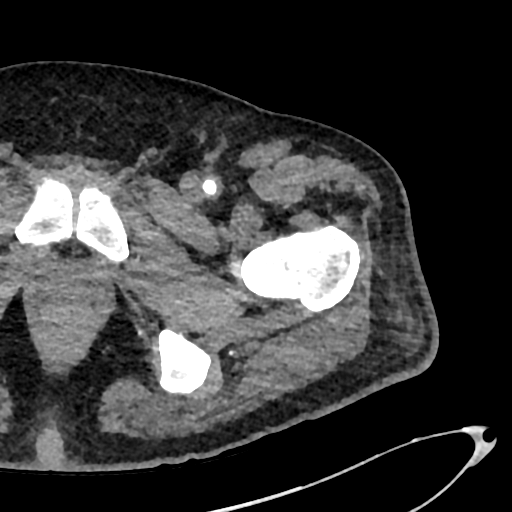

[Series 12: arterial · axial · arterial · 0.85mm/px · z∈[-1098,-312]mm · 5 of 591 slices shown (2 of 2)]
[im 99/591  soft-tissue]
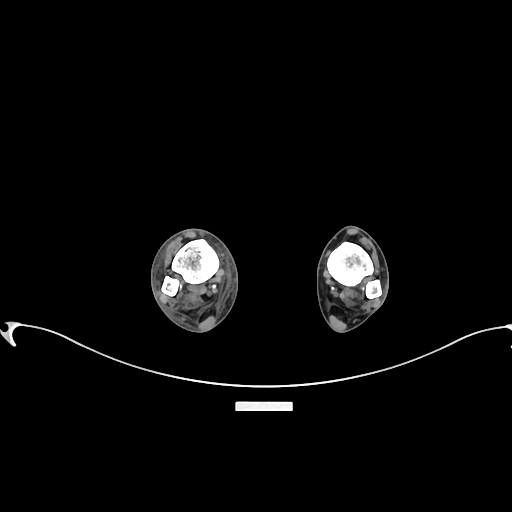
[im 197/591  soft-tissue]
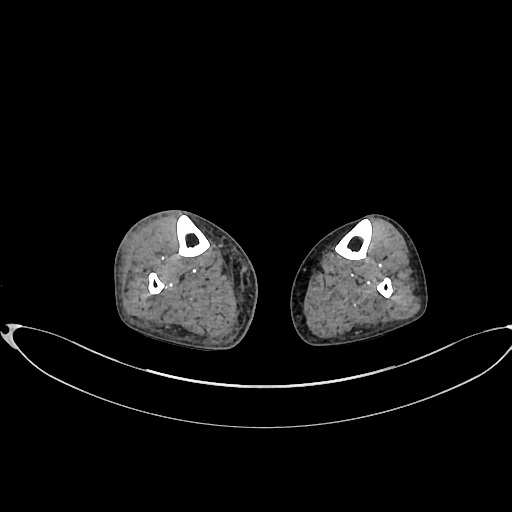
[im 296/591  soft-tissue]
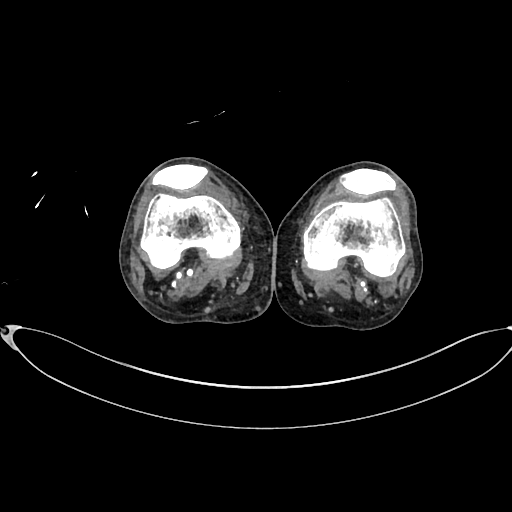
[im 394/591  soft-tissue]
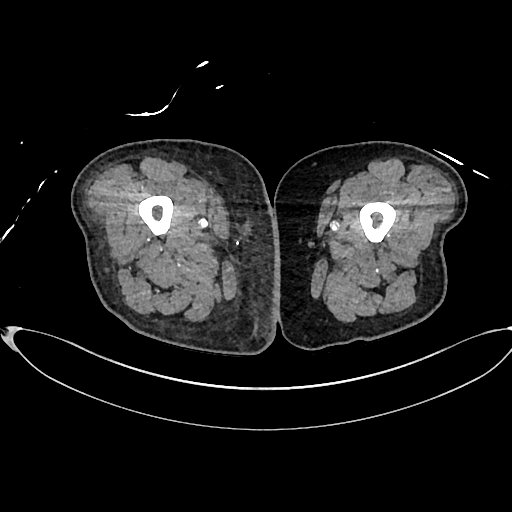
[im 492/591  soft-tissue]
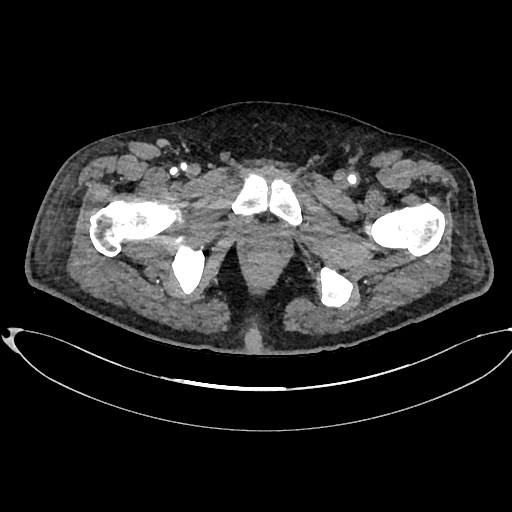

[10 of 33 positions shown; findings below may reference images not displayed]

FINDINGS: Vascular structures:

Aorta: Distal abdominal aorta is image.  No evidence for aneurysm.

Right lower extremity: Right common iliac artery is patent with mild
atherosclerotic disease. Mild narrowing near the origin of the right
internal iliac artery. Stent in the proximal right external iliac
artery is patent. Right common femoral artery is patent. Right
profunda femoral arteries are patent. Atherosclerotic disease and
mild narrowing in the proximal right SFA. Right SFA is patent. There
is a reversed great saphenous vein bypass graft coming off the
proximal popliteal artery. Graft is widely patent and ties into the
tibioperoneal trunk. Popliteal artery occludes just above the knee
joint. Small amount of flow in the proximal anterior tibial artery.
There is no significant flow in the anterior tibial artery within
the mid or distal calf. Atherosclerotic calcifications involving the
tibioperoneal trunk distal to the bypass. At least mild stenosis at
the origin of the posterior tibial artery. Posterior tibial artery
is patent down to the ankle. Posterior tibial artery extends into
the foot. Peroneal artery is patent down to the ankle.

Left lower extremity: Mild atherosclerotic disease in the left
common iliac artery without significant stenosis. Mild narrowing at
the origin of the left internal iliac artery. Left external iliac
artery is patent without significant stenosis or plaque. Minimal
disease in left common femoral artery without significant stenosis.
Left profunda femoral arteries are patent. Left SFA is patent with
mild-to-moderate stenosis in the distal SFA. Left popliteal artery
is patent with mild disease. Anterior tibial artery occludes
proximally. Evidence for occlusion in the tibioperoneal trunk with
reconstitution of the peroneal and posterior tibial arteries.
Peroneal and posterior tibial arteries are patent to the ankle.

Veins: Right great saphenous vein was harvested for the bypass graft
with surgical clips just distal to the saphenofemoral junction.
Normal caliber of the iliac veins. No gross venous abnormalities in
the left lower extremity.

Nonvascular structures:

Pelvic structures: Normal appearance of the prostate, seminal
vesicles and urinary bladder. Visualized bowel structures are
unremarkable. Mildly prominent inguinal lymph nodes, right side
greater than left. Prominent lymph nodes in the external iliac
chains bilaterally. Left external iliac chain lymph node measures
1.0 cm short axis on sequence 12, image 70. Slightly prominent left
pelvic lymph node along the sidewall measuring 1.0 in the short axis
on image 71. No evidence for free fluid.

Musculoskeletal: Diffuse subcutaneous edema in the right lower
extremity. Poorly defined fluid collection compatible with a
hematoma along the medial distal right thigh at the level of the
proximal surgical anastomosis. This fluid collection roughly
measures 6.9 x 4.1 x 7.7 cm. No gas in this collection. Additional
small collection in the medial right thigh subcutaneous tissues
measures up to 1.9 cm. Small amount of fluid in the right knee
joint. Ill-defined fluid collection in the proximal medial right
calf is compatible with postoperative hematoma that measures roughly
3.2 x 3.0 x 7.7 cm. There is no evidence for arterial contrast
extravasation or pseudoaneurysms at these hematomas.

There is a complex fluid collection in the anterior lower left thigh
just above the patella. This collection extends into the
intramuscular space and there is some subcutaneous edema and there
may even be a small skin tract. This area measures up to 5.2 cm in
length. Findings are suggestive for an intramuscular hematoma. There
is a left suprapatellar joint effusion adjacent to this presumed
hematoma.

Review of the MIP images confirms the above findings.
IMPRESSION: Vascular:

1. Right popliteal to tibioperoneal trunk bypass graft is patent.
2. Two vessel runoff in the right lower extremity.
3. Two vessel runoff in the left lower extremity but there appears
to be an occlusion involving the left tibioperoneal trunk.
4. Mild-to-moderate stenosis in the distal left SFA.
5. Right external iliac artery stent is patent.

Nonvascular:

1. Poorly defined fluid collections along the medial aspect of the
distal right thigh and proximal right calf. These are compatible
with postoperative hematomas. No evidence for bleeding or
pseudoaneurysm formations in this area.
2. Evidence for an intramuscular hematoma in the anterior lower LEFT
thigh just above the
LEFT knee. There is an adjacent LEFT knee joint effusion. Question
postoperative changes in this area.
3. Prominent lymph nodes in the lower pelvis and inguinal regions.
Findings are nonspecific but lymph nodes enlargement could be
reactive.

## 2019-05-09 MED ORDER — ATORVASTATIN CALCIUM 10 MG PO TABS
10.0000 mg | ORAL_TABLET | Freq: Every day | ORAL | Status: DC
  Administered 2019-05-09: 10 mg via ORAL
  Filled 2019-05-09: qty 1

## 2019-05-09 MED ORDER — LABETALOL HCL 5 MG/ML IV SOLN: 10.0000 mg | INTRAVENOUS | Status: DC | PRN

## 2019-05-09 MED ORDER — SODIUM CHLORIDE 0.9 % IV SOLN: 250.0000 mL | INTRAVENOUS | Status: DC | PRN

## 2019-05-09 MED ORDER — GABAPENTIN 100 MG PO CAPS
100.0000 mg | ORAL_CAPSULE | Freq: Every day | ORAL | Status: DC
  Administered 2019-05-09: 23:00:00 100 mg via ORAL
  Filled 2019-05-09: qty 1

## 2019-05-09 MED ORDER — ACETAMINOPHEN 325 MG RE SUPP: 325.0000 mg | RECTAL | Status: DC | PRN

## 2019-05-09 MED ORDER — LISINOPRIL 20 MG PO TABS
20.0000 mg | ORAL_TABLET | Freq: Every day | ORAL | Status: DC
  Administered 2019-05-09 – 2019-05-10 (×2): 20 mg via ORAL
  Filled 2019-05-09 (×2): qty 2
  Filled 2019-05-09 (×2): qty 1

## 2019-05-09 MED ORDER — IOHEXOL 350 MG/ML SOLN
100.0000 mL | Freq: Once | INTRAVENOUS | Status: AC | PRN
  Administered 2019-05-09: 100 mL via INTRAVENOUS

## 2019-05-09 MED ORDER — HEPARIN SOD (PORK) LOCK FLUSH 100 UNIT/ML IV SOLN
250.0000 [IU] | Freq: Every day | INTRAVENOUS | Status: DC
  Administered 2019-05-09: 10:00:00 250 [IU]

## 2019-05-09 MED ORDER — DAPTOMYCIN IV (FOR PTA / DISCHARGE USE ONLY): 850.0000 mg | INTRAVENOUS | Status: DC

## 2019-05-09 MED ORDER — HEPARIN SOD (PORK) LOCK FLUSH 100 UNIT/ML IV SOLN: 250.0000 [IU] | INTRAVENOUS | Status: DC | PRN

## 2019-05-09 MED ORDER — PANTOPRAZOLE SODIUM 40 MG PO TBEC
40.0000 mg | DELAYED_RELEASE_TABLET | Freq: Every day | ORAL | Status: DC
  Administered 2019-05-10: 40 mg via ORAL
  Filled 2019-05-09: qty 1

## 2019-05-09 MED ORDER — PHENOL 1.4 % MT LIQD
1.0000 | OROMUCOSAL | Status: DC | PRN
  Filled 2019-05-09: qty 177

## 2019-05-09 MED ORDER — CEFTAROLINE IV (FOR PTA / DISCHARGE USE ONLY): 600.0000 mg | Freq: Three times a day (TID) | INTRAVENOUS | Status: DC

## 2019-05-09 MED ORDER — METOPROLOL TARTRATE 5 MG/5ML IV SOLN: 2.0000 mg | INTRAVENOUS | Status: DC | PRN

## 2019-05-09 MED ORDER — GUAIFENESIN-DM 100-10 MG/5ML PO SYRP: 15.0000 mL | ORAL_SOLUTION | ORAL | Status: DC | PRN

## 2019-05-09 MED ORDER — SODIUM CHLORIDE 0.9 % IV SOLN
850.0000 mg | Freq: Every day | INTRAVENOUS | Status: DC
  Administered 2019-05-09: 850 mg via INTRAVENOUS
  Filled 2019-05-09: qty 17

## 2019-05-09 MED ORDER — OXYCODONE HCL 5 MG PO TABS
5.0000 mg | ORAL_TABLET | ORAL | Status: DC | PRN
  Administered 2019-05-10: 01:00:00 10 mg via ORAL
  Filled 2019-05-09: qty 2

## 2019-05-09 MED ORDER — POTASSIUM CHLORIDE CRYS ER 20 MEQ PO TBCR
20.0000 meq | EXTENDED_RELEASE_TABLET | Freq: Once | ORAL | Status: AC
  Administered 2019-05-09: 20 meq via ORAL
  Filled 2019-05-09: qty 1

## 2019-05-09 MED ORDER — HYDROCHLOROTHIAZIDE 25 MG PO TABS
25.0000 mg | ORAL_TABLET | Freq: Every day | ORAL | Status: DC
  Administered 2019-05-10: 10:00:00 25 mg via ORAL
  Filled 2019-05-09: qty 1

## 2019-05-09 MED ORDER — INSULIN ASPART 100 UNIT/ML ~~LOC~~ SOLN
0.0000 [IU] | Freq: Three times a day (TID) | SUBCUTANEOUS | Status: DC
  Administered 2019-05-09: 17:00:00 5 [IU] via SUBCUTANEOUS
  Administered 2019-05-10: 3 [IU] via SUBCUTANEOUS

## 2019-05-09 MED ORDER — ACETAMINOPHEN 325 MG PO TABS
325.0000 mg | ORAL_TABLET | ORAL | Status: DC | PRN
  Administered 2019-05-09 – 2019-05-10 (×2): 650 mg via ORAL
  Filled 2019-05-09 (×2): qty 2

## 2019-05-09 MED ORDER — ALUM & MAG HYDROXIDE-SIMETH 200-200-20 MG/5ML PO SUSP: 15.0000 mL | ORAL | Status: DC | PRN

## 2019-05-09 MED ORDER — CLOPIDOGREL BISULFATE 75 MG PO TABS
75.0000 mg | ORAL_TABLET | Freq: Every day | ORAL | Status: DC
  Administered 2019-05-10: 75 mg via ORAL
  Filled 2019-05-09: qty 1

## 2019-05-09 MED ORDER — HYDRALAZINE HCL 20 MG/ML IJ SOLN: 5.0000 mg | INTRAMUSCULAR | Status: DC | PRN

## 2019-05-09 MED ORDER — MORPHINE SULFATE (PF) 2 MG/ML IV SOLN: 2.0000 mg | INTRAVENOUS | Status: DC | PRN

## 2019-05-09 MED ORDER — SODIUM CHLORIDE 0.9 % IV SOLN
600.0000 mg | Freq: Once | INTRAVENOUS | Status: AC
  Administered 2019-05-09: 12:00:00 600 mg via INTRAVENOUS
  Filled 2019-05-09: qty 600

## 2019-05-09 MED ORDER — ONDANSETRON HCL 4 MG/2ML IJ SOLN: 4.0000 mg | Freq: Four times a day (QID) | INTRAMUSCULAR | Status: DC | PRN

## 2019-05-09 MED ORDER — SODIUM CHLORIDE 0.9 % IV SOLN
600.0000 mg | Freq: Three times a day (TID) | INTRAVENOUS | Status: DC
  Administered 2019-05-09 – 2019-05-10 (×2): 600 mg via INTRAVENOUS
  Filled 2019-05-09 (×6): qty 600

## 2019-05-09 MED ORDER — SODIUM CHLORIDE 0.9% FLUSH
3.0000 mL | Freq: Two times a day (BID) | INTRAVENOUS | Status: DC
  Administered 2019-05-10: 10:00:00 3 mL via INTRAVENOUS

## 2019-05-09 MED ORDER — SODIUM CHLORIDE 0.9% FLUSH: 3.0000 mL | INTRAVENOUS | Status: DC | PRN

## 2019-05-09 MED ORDER — DOCUSATE SODIUM 100 MG PO CAPS
100.0000 mg | ORAL_CAPSULE | Freq: Two times a day (BID) | ORAL | Status: DC
  Administered 2019-05-09 – 2019-05-10 (×2): 100 mg via ORAL
  Filled 2019-05-09 (×2): qty 1

## 2019-05-09 NOTE — Progress Notes (Signed)
Right lower extremity venous duplex completed. Refer to "CV Proc" under chart review to view preliminary results.  05/09/2019 2:01 PM Eula Fried., MHA, RVT, RDCS, RDMS

## 2019-05-09 NOTE — Progress Notes (Signed)
Patient told me that he noticed bleeding from one of his incisions last night after showering. He stated it bled from 10pm until 4am. Bleeding has stopped now, swelling and warmth noted to 2 incision sites, one on lower right leg, and one near right groin. No fevers at infusion visits. Vascular office called and advised patient to go to Emergency Room. Will take patient there after antibiotic infusion complete.

## 2019-05-09 NOTE — ED Notes (Signed)
Lunch tray ordered 

## 2019-05-09 NOTE — H&P (Addendum)
Referring Physician: [place]  Patient name: Thomas [name] MRN: [id number] DOB: 1975/08/06 Sex: male  REASON FOR CONSULT: right leg pain, swelling bleeding  HPI: Thomas [name] is a 44 y.o. male, s/p right above knee to TPT bypass with vein by Dr Carlis Abbott 17 days ago.  He has had a little bit of swelling at all of his incisions which may be slightly worse since discharge.  His main complaint today was some pinkish blood discharge from a drain site in his right posterior calf.   He is not actively bleeding.  He has not really had expansion of his calf or increasing pain.  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  Pt is also on Eliquis for calf vein DVT seen 12/10.   He is also on Plavix.  He is on IV antibiotics for diskitis to end 06/16/19.    Interview conducted through [facility] interpreter pt speaks Arabic  Past Medical History:  Diagnosis Date  . Depression 01/27/2015  . Diabetes mellitus without complication ([place])   . Hyperlipidemia   . Hypertension    Past Surgical History:  Procedure Laterality Date  . ABDOMINAL AORTOGRAM W/LOWER EXTREMITY Right 04/19/2019   Procedure: ABDOMINAL AORTOGRAM W/LOWER EXTREMITY;  Surgeon: Marty Heck, MD;  Location: [facility];  Service: Cardiovascular;  Laterality: Right;  . FEMORAL-TIBIAL BYPASS GRAFT Right 04/22/2019   Procedure: Right  FEMORAL-TIBIAL ARTERY BYPASS GRAFT with Harvest of Saphenous vein RIGHT SUPERFICIAL FEMORAL ARTERY TO TIBIOPERONEAL TRUNK BYPASS;  Surgeon: Marty Heck, MD;  Location: [facility];  Service: Vascular;  Laterality: Right;  . IR CERVICAL/THORACIC [place] W/IMAG GUIDE  04/15/2019  . IR FLUORO GUIDED NEEDLE PLC ASPIRATION/INJECTION LOC  04/15/2019  . kidney stones  2009  . PERIPHERAL VASCULAR INTERVENTION  04/19/2019   Procedure: PERIPHERAL VASCULAR INTERVENTION;  Surgeon: Marty Heck, MD;  Location: [place] [facility];  Service: Cardiovascular;;  right external iliac  .  TEE WITHOUT CARDIOVERSION N/A 04/19/2019   Procedure: TRANSESOPHAGEAL ECHOCARDIOGRAM (TEE);  Surgeon: Donato Heinz, MD;  Location: [facility];  Service: Endoscopy;  Laterality: N/A;    No family history on file.  SOCIAL HISTORY: Social History   Socioeconomic History  . Marital status: Single    Spouse name: Not on file  . Number of children: Not on file  . Years of education: Not on file  . Highest education level: Not on file  Occupational History  . Not on file  Tobacco Use  . Smoking status: Former Research scientist (life sciences)  . Smokeless tobacco: Never Used  Substance and Sexual Activity  . Alcohol use: No    Alcohol/week: 0.0 standard drinks  . Drug use: No  . Sexual activity: Not on file  Other Topics Concern  . Not on file  Social History Narrative  . Not on file   Social Determinants of Health   Financial Resource Strain:   . Difficulty of Paying Living Expenses: Not on file  Food Insecurity:   . Worried About Charity fundraiser in the Last Year: Not on file  . Ran Out of Food in the Last Year: Not on file  Transportation Needs:   . Lack of Transportation (Medical): Not on file  . Lack of Transportation (Non-Medical): Not on file  Physical Activity:   . Days of Exercise per Week: Not on file  . Minutes of Exercise per Session: Not on file  Stress:   . Feeling of Stress :  Not on file  Social Connections:   . Frequency of Communication with Friends and Family: Not on file  . Frequency of Social Gatherings with Friends and Family: Not on file  . Attends Religious Services: Not on file  . Active Member of Clubs or Organizations: Not on file  . Attends Archivist Meetings: Not on file  . Marital Status: Not on file  Intimate Partner Violence:   . Fear of Current or Ex-Partner: Not on file  . Emotionally Abused: Not on file  . Physically Abused: Not on file  . Sexually Abused: Not on file    No Known Allergies  Current Facility-Administered Medications   Medication Dose Route Frequency Provider Last Rate Last Admin  . ceftaroline (TEFLARO) 600 mg in sodium chloride 0.9 % 250 mL IVPB  600 mg Intravenous Once Isla Pence, MD      . iohexol (OMNIPAQUE) 350 MG/ML injection 100 mL  100 mL Intravenous Once PRN Isla Pence, MD       Current Outpatient Medications  Medication Sig Dispense Refill  . apixaban (ELIQUIS) 5 MG TABS tablet Take 1 tablet (5 mg total) by mouth daily. 60 tablet 1  . atorvastatin (LIPITOR) 10 MG tablet Take 1 tablet (10 mg total) by mouth daily at 6 PM. 30 tablet 2  . ceftaroline (TEFLARO) IVPB Inject 600 mg into the vein every 8 (eight) hours. Indication:  Persistent MRSA bacteremia and vertebral osteomyelitis  Last Day of Therapy:  06/17/2019 Labs - Once weekly:  CBC/D and BMP, Labs - Every other week:  ESR and CRP 147 Units 0  . clopidogrel (PLAVIX) 75 MG tablet Take 1 tablet (75 mg total) by mouth daily with breakfast. 30 tablet 2  . daptomycin (CUBICIN) IVPB Inject 850 mg into the vein daily. Indication:  Persistent MRSA bacteremia and vertebral osteomyelitis  Last Day of Therapy:  06/17/2019 Labs - Once weekly:  CBC/D, BMP, and CPK Labs - Every other week:  ESR and CRP 49 Units 0  . gabapentin (NEURONTIN) 100 MG capsule TAKE ONE CAPSULE BY MOUTH AT BEDTIME (Patient taking differently: Take 100 mg by mouth at bedtime. ) 30 capsule 0  . Heating Pads PADS Use over the counter heating pads as needed for back pain 1 Units 5  . hydrochlorothiazide (HYDRODIURIL) 25 MG tablet TAKE ONE TABLET BY MOUTH DAILY (Patient taking differently: Take 25 mg by mouth daily. ) 30 tablet 0  . Insulin Glargine (LANTUS SOLOSTAR) 100 UNIT/ML Solostar Pen 31 units into skin q hs (Patient taking differently: Inject 31 Units into the skin at bedtime. ) 5 pen 3  . lidocaine (LIDODERM) 5 % Place 1 patch onto the skin daily. Remove & Discard patch within 12 hours or as directed by MD 30 patch 0  . lisinopril (ZESTRIL) 20 MG tablet TAKE ONE TABLET  BY MOUTH DAILY (Patient taking differently: Take 20 mg by mouth daily. ) 30 tablet 0  . metFORMIN (GLUCOPHAGE-XR) 500 MG 24 hr tablet TAKE TWO TABLETS BY MOUTH TWICE A DAY (Patient taking differently: Take 1,000 mg by mouth 2 (two) times daily. ) 120 tablet 0   Facility-Administered Medications Ordered in Other Encounters  Medication Dose Route Frequency Provider Last Rate Last Admin  . DAPTOmycin (CUBICIN) 850 mg in sodium chloride 0.9 % IVPB  850 mg Intravenous Daily [place], FNP 234 mL/hr at 05/09/19 0925 850 mg at 05/09/19 0925  . heparin lock flush 100 unit/mL  250 Units Intracatheter Daily [place] Circle,  FNP   250 Units at 05/09/19 1001   And  . heparin lock flush 100 unit/mL  250 Units Intracatheter PRN [place], FNP         Physical Examination  Vitals:   05/09/19 1017 05/09/19 1018 05/09/19 1019  BP: (!) 165/98    Pulse: 95 88   Resp: (!) 9 (!) 22   Temp:   98.1 F (36.7 C)  TempSrc:   Oral  SpO2: 100% 100%     There is no height or weight on file to calculate BMI.  General:  Alert and oriented, no acute distress HEENT: Normal Neck: No  JVD Pulmonary: Clear to auscultation bilaterally Cardiac: Regular Rate and Rhythm  Abdomen: Soft, non-tender, non-distended Skin: No rash or erythema, no active bleeding from drain site right medial calf, calf and thigh are fairly firm but no focal hematoma, dry gangrene of toes that appears to be healing Extremity Pulses:  2+ radial, brachial, femoral no pedal pulses but foot is pink and warm Musculoskeletal: No deformity or edema  Neurologic: Upper and lower extremity motor 5/5 and symmetric  DATA:  CBC    Component Value Date/Time   WBC 6.0 05/09/2019 1039   RBC 3.41 (L) 05/09/2019 1039   HGB 8.3 (L) 05/09/2019 1039   HCT 27.8 (L) 05/09/2019 1039   PLT 545 (H) 05/09/2019 1039   MCV 81.5 05/09/2019 1039   MCH 24.3 (L) 05/09/2019 1039   MCHC 29.9 (L) 05/09/2019 1039   RDW 15.5 05/09/2019 1039    LYMPHSABS 1.6 05/09/2019 1039   MONOABS 0.6 05/09/2019 1039   EOSABS 0.2 05/09/2019 1039   BASOSABS 0.0 05/09/2019 1039    BMET    Component Value Date/Time   NA 136 05/09/2019 1039   K 3.8 05/09/2019 1039   CL 103 05/09/2019 1039   CO2 24 05/09/2019 1039   GLUCOSE 104 (H) 05/09/2019 1039   BUN 14 05/09/2019 1039   CREATININE 0.82 05/09/2019 1039   CREATININE 0.98 11/14/2016 1352   CALCIUM 8.9 05/09/2019 1039   GFRNONAA >60 05/09/2019 1039   GFRNONAA >89 11/14/2016 1352   GFRAA >60 05/09/2019 1039   GFRAA >89 11/14/2016 1352    CTAngio- Unfortunately a left leg CT was done, not the affected right leg.  Left leg shows mild SFA stenosis 30% tibial origin calcification maybe short segment occlusion with 3 vessel runoff  ASSESSMENT:  Hematoma right leg   PLAN:  1.  Stop Eliquis today  2. Repeat DVT scan right leg.  If DVT has resolved or not propagated will consider d/c of Eliquis  3. Will obtain COVID test in event he needs hematoma evacuation   4. Resume all home meds including antibiotics  5. Sliding Scale Insulin.  Hold metformin 48 hr due to contrast load  Ruta Hinds, MD [facility] [place]: [phone]   10 pm Addendum.  Radiology able to retrieve source images of right leg.  Bypass patent, popliteal fossa hematoma 3 x 7, thigh hematoma 7 x 7 cm  DVT US right leg negative  Dr Carlis Abbott to review in am and consider d/c of Eliquis   Ruta Hinds, MD [facility] [place]: [phone] Pager: [phone]

## 2019-05-09 NOTE — ED Provider Notes (Signed)
[facility] EMERGENCY DEPARTMENT Provider Note   CSN: [id number] Arrival date & time: 05/09/19  1010     History No chief complaint on file.   Nissim Fleischer is a 44 y.o. male.  Pt presents to the ED today with bleeding from his leg wound.  The pt has a complex past medical hx and was admitted 12/29-1/18 for sepsis from MRSA bacteremia, osteomyelitis, discitis.  He also developed critical limb ischemia in his right leg and had a stent placed in his right iliac artery.  Pt then had a right SFT to TP trunk bypass for popliteal occlusion.  Pt also received a transfusion for a thigh hematoma.  The pt has been receiving outpatient abx.  The pt has had some bleeding from his surgery site.  Pt is on Eliquis for a DVT.  Pt does not have any significant pain in his right leg.  He still has some dry gangrene.  No f/c.  Due to language barrier, an interpreter was present during the history-taking and subsequent discussion (and for part of the physical exam) with this patient.         Past Medical History:  Diagnosis Date  . Depression 01/27/2015  . Diabetes mellitus without complication ([place])   . Hyperlipidemia   . Hypertension     Patient Active Problem List   Diagnosis Date Noted  . Hematoma 05/09/2019  . Discitis of thoracolumbar region   . MRSA bacteremia   . Endocarditis of mitral valve   . Rectal mass 04/14/2019  . DVT of popliteal vein ([place]) 04/14/2019  . Gangrene of toe of right foot ([place]) 04/14/2019  . Osteomyelitis of spine ([place]) 04/13/2019  . Diabetes mellitus type II, uncontrolled ([place]) 01/27/2015  . Diabetic neuropathy ([place]) 01/27/2015  . HTN (hypertension) 01/27/2015  . Depression 01/27/2015  . Hyperlipidemia 01/27/2015    Past Surgical History:  Procedure Laterality Date  . ABDOMINAL AORTOGRAM W/LOWER EXTREMITY Right 04/19/2019   Procedure: ABDOMINAL AORTOGRAM W/LOWER EXTREMITY;  Surgeon: Marty Heck, MD;  Location: [facility];   Service: Cardiovascular;  Laterality: Right;  . FEMORAL-TIBIAL BYPASS GRAFT Right 04/22/2019   Procedure: Right  FEMORAL-TIBIAL ARTERY BYPASS GRAFT with [place] of Saphenous vein RIGHT SUPERFICIAL FEMORAL ARTERY TO TIBIOPERONEAL TRUNK BYPASS;  Surgeon: Marty Heck, MD;  Location: [facility];  Service: Vascular;  Laterality: Right;  . IR CERVICAL/THORACIC [place] W/IMAG GUIDE  04/15/2019  . IR FLUORO GUIDED NEEDLE PLC ASPIRATION/INJECTION LOC  04/15/2019  . kidney stones  2009  . PERIPHERAL VASCULAR INTERVENTION  04/19/2019   Procedure: PERIPHERAL VASCULAR INTERVENTION;  Surgeon: Marty Heck, MD;  Location: [facility];  Service: Cardiovascular;;  right external iliac  . TEE WITHOUT CARDIOVERSION N/A 04/19/2019   Procedure: TRANSESOPHAGEAL ECHOCARDIOGRAM (TEE);  Surgeon: Donato Heinz, MD;  Location: [facility] END[facility];  Service: Endoscopy;  Laterality: N/A;       No family history on file.  Social History   Tobacco Use  . Smoking status: Former Research scientist (life sciences)  . Smokeless tobacco: Never Used  Substance Use Topics  . Alcohol use: No    Alcohol/week: 0.0 standard drinks  . Drug use: No    Home Medications Prior to Admission medications   Medication Sig Start Date End Date Taking? Authorizing Provider  apixaban (ELIQUIS) 5 MG TABS tablet Take 1 tablet (5 mg total) by mouth daily. 04/13/19 08/11/19  Saguier, Percell Miller, PA-C  atorvastatin (LIPITOR) 10 MG tablet Take 1 tablet (10 mg total) by  mouth daily at 6 PM. 04/30/19   Dessa Phi, DO  ceftaroline (TEFLARO) IVPB Inject 600 mg into the vein every 8 (eight) hours. Indication:  Persistent MRSA bacteremia and vertebral osteomyelitis  Last Day of Therapy:  06/17/2019 Labs - Once weekly:  CBC/D and BMP, Labs - Every other week:  ESR and CRP 04/30/19 06/18/19  Dessa Phi, DO  clopidogrel (PLAVIX) 75 MG tablet Take 1 tablet (75 mg total) by mouth daily with breakfast. 04/30/19   Dessa Phi, DO  daptomycin (CUBICIN) IVPB  Inject 850 mg into the vein daily. Indication:  Persistent MRSA bacteremia and vertebral osteomyelitis  Last Day of Therapy:  06/17/2019 Labs - Once weekly:  CBC/D, BMP, and CPK Labs - Every other week:  ESR and CRP 04/30/19 06/18/19  Dessa Phi, DO  gabapentin (NEURONTIN) 100 MG capsule TAKE ONE CAPSULE BY MOUTH AT BEDTIME Patient taking differently: Take 100 mg by mouth at bedtime.  01/27/19   Saguier, Percell Miller, PA-C  Heating Pads PADS Use over the counter heating pads as needed for back pain 04/30/19   Dessa Phi, DO  hydrochlorothiazide (HYDRODIURIL) 25 MG tablet TAKE ONE TABLET BY MOUTH DAILY Patient taking differently: Take 25 mg by mouth daily.  01/27/19   Saguier, Percell Miller, PA-C  Insulin Glargine (LANTUS SOLOSTAR) 100 UNIT/ML Solostar Pen 31 units into skin q hs Patient taking differently: Inject 31 Units into the skin at bedtime.  04/03/19   Saguier, Percell Miller, PA-C  lidocaine (LIDODERM) 5 % Place 1 patch onto the skin daily. Remove & Discard patch within 12 hours or as directed by MD 05/04/19   Dessa Phi, DO  lisinopril (ZESTRIL) 20 MG tablet TAKE ONE TABLET BY MOUTH DAILY Patient taking differently: Take 20 mg by mouth daily.  01/27/19   Saguier, Percell Miller, PA-C  metFORMIN (GLUCOPHAGE-XR) 500 MG 24 hr tablet TAKE TWO TABLETS BY MOUTH TWICE A DAY Patient taking differently: Take 1,000 mg by mouth 2 (two) times daily.  01/27/19   Saguier, Percell Miller, PA-C    Allergies    Patient has no known allergies.  Review of Systems   Review of Systems  Skin:       Bleeding from wound site  All other systems reviewed and are negative.   Physical Exam Updated Vital Signs BP (!) 146/86   Pulse 82   Temp 98.1 F (36.7 C) (Oral)   Resp 19   SpO2 100%   Physical Exam Vitals and nursing note reviewed.  Constitutional:      Appearance: Normal appearance.  HENT:     Head: Normocephalic and atraumatic.     Right Ear: External ear normal.     Left Ear: External ear normal.     Nose: Nose  normal.     Mouth/Throat:     Mouth: Mucous membranes are moist.  Eyes:     Extraocular Movements: Extraocular movements intact.     Conjunctiva/sclera: Conjunctivae normal.     Pupils: Pupils are equal, round, and reactive to light.  Cardiovascular:     Rate and Rhythm: Normal rate and regular rhythm.     Pulses: Normal pulses.     Heart sounds: Normal heart sounds.  Pulmonary:     Effort: Pulmonary effort is normal.     Breath sounds: Normal breath sounds.  Abdominal:     General: Abdomen is flat. Bowel sounds are normal.     Palpations: Abdomen is soft.  Musculoskeletal:     Cervical back: Normal range of motion and neck supple.  Comments: See pictures  Skin:    Capillary Refill: Capillary refill takes less than 2 seconds.     Comments: See pictures.  Neurological:     General: No focal deficit present.     Mental Status: He is alert and oriented to person, place, and time.  Psychiatric:          Mood and Affect: Mood normal.        Behavior: Behavior normal.         ED Results / Procedures / Treatments   Labs (all labs ordered are listed, but only abnormal results are displayed) Labs Reviewed  COMPREHENSIVE METABOLIC PANEL - Abnormal; Notable for the following components:      Result Value   Glucose, Bld 104 (*)    Albumin 2.8 (*)    AST 14 (*)    All other components within normal limits  CBC WITH DIFFERENTIAL/PLATELET - Abnormal; Notable for the following components:   RBC 3.41 (*)    Hemoglobin 8.3 (*)    HCT 27.8 (*)    MCH 24.3 (*)    MCHC 29.9 (*)    Platelets 545 (*)    All other components within normal limits  CULTURE, BLOOD (ROUTINE X 2)  CULTURE, BLOOD (ROUTINE X 2)  SARS CORONAVIRUS 2 (TAT 6-24 HRS)  PROTIME-INR  LACTIC ACID, PLASMA  LACTIC ACID, PLASMA  CBC  PROTIME-INR  URINALYSIS, ROUTINE W REFLEX MICROSCOPIC    EKG None  Radiology No results found.  Procedures Procedures (including critical care time)  Medications  Ordered in ED Medications  ceftaroline (TEFLARO) 600 mg in sodium chloride 0.9 % 250 mL IVPB (600 mg Intravenous New Bag/Given 05/09/19 1154)  atorvastatin (LIPITOR) tablet 10 mg (has no administration in time range)  ceftaroline (TEFLARO) IVPB (has no administration in time range)  clopidogrel (PLAVIX) tablet 75 mg (has no administration in time range)  daptomycin (CUBICIN) IVPB (has no administration in time range)  gabapentin (NEURONTIN) capsule 100 mg (has no administration in time range)  hydrochlorothiazide (HYDRODIURIL) tablet 25 mg (has no administration in time range)  lisinopril (ZESTRIL) tablet 20 mg (has no administration in time range)  potassium chloride SA (KLOR-CON) CR tablet 20-40 mEq (has no administration in time range)  ondansetron (ZOFRAN) injection 4 mg (has no administration in time range)  alum & mag hydroxide-simeth (MAALOX/MYLANTA) 200-200-20 MG/5ML suspension 15-30 mL (has no administration in time range)  pantoprazole (PROTONIX) EC tablet 40 mg (has no administration in time range)  labetalol (NORMODYNE) injection 10 mg (has no administration in time range)  hydrALAZINE (APRESOLINE) injection 5 mg (has no administration in time range)  metoprolol tartrate (LOPRESSOR) injection 2-5 mg (has no administration in time range)  guaiFENesin-dextromethorphan (ROBITUSSIN DM) 100-10 MG/5ML syrup 15 mL (has no administration in time range)  phenol (CHLORASEPTIC) mouth spray 1 spray (has no administration in time range)  sodium chloride flush (NS) 0.9 % injection 3 mL (has no administration in time range)  sodium chloride flush (NS) 0.9 % injection 3 mL (has no administration in time range)  0.9 %  sodium chloride infusion (has no administration in time range)  acetaminophen (TYLENOL) tablet 325-650 mg (has no administration in time range)    Or  acetaminophen (TYLENOL) suppository 325-650 mg (has no administration in time range)  morphine 2 MG/ML injection 2-5 mg (has no  administration in time range)  oxyCODONE (Oxy IR/ROXICODONE) immediate release tablet 5-10 mg (has no administration in time range)  docusate sodium (COLACE) capsule 100  mg (has no administration in time range)  insulin aspart (novoLOG) injection 0-15 Units (has no administration in time range)  iohexol (OMNIPAQUE) 350 MG/ML injection 100 mL (100 mLs Intravenous Contrast Given 05/09/19 1144)    ED Course  I have reviewed the triage vital signs and the nursing notes.  Pertinent labs & imaging results that were available during my care of the patient were reviewed by me and considered in my medical decision making (see chart for details).    MDM Rules/Calculators/A&P                      Pt did get his Daptomycin dose today at short stay.  He requested his dose of Teflaro in ED as it was due at 1000.  This was ordered.  Hgb is 8.3 which is up from 7.5 on 1/17.  Pt d/w Dr. Oneida Alar (vascular).    Unfortunately CT scan was done of the incorrect extremity (due to my error), but Dr. Oneida Alar did not want another one.  Dr. Oneida Alar will admit and observe and hold the Eliquis.  Final Clinical Impression(s) / ED Diagnoses Final diagnoses:  Hematoma of leg, right, initial encounter    Rx / DC Orders ED Discharge Orders    None       Isla Pence, MD 05/09/19 1212

## 2019-05-09 NOTE — ED Triage Notes (Addendum)
Pt here with bleeding to the rt  Leg that has resolved. Pt had surgery on his rt leg to remove blood clots. Rt arm restricted. Denies fevers, chest pain, & SOB.  [facility]

## 2019-05-10 ENCOUNTER — Ambulatory Visit: Payer: No Typology Code available for payment source | Admitting: Medical

## 2019-05-10 ENCOUNTER — Encounter (HOSPITAL_COMMUNITY): Admission: RE | Admit: 2019-05-10 | Payer: Medicaid Other | Source: Ambulatory Visit

## 2019-05-10 LAB — GLUCOSE, CAPILLARY
Glucose-Capillary: 164 mg/dL — ABNORMAL HIGH (ref 70–99)
Glucose-Capillary: 244 mg/dL — ABNORMAL HIGH (ref 70–99)

## 2019-05-10 MED ORDER — CHLORHEXIDINE GLUCONATE CLOTH 2 % EX PADS
6.0000 | MEDICATED_PAD | Freq: Every day | CUTANEOUS | Status: DC
  Administered 2019-05-10: 10:00:00 6 via TOPICAL

## 2019-05-10 MED ORDER — ASPIRIN EC 81 MG PO TBEC: 81.0000 mg | DELAYED_RELEASE_TABLET | Freq: Every day | ORAL | 2 refills | Status: DC

## 2019-05-10 MED ORDER — HEPARIN SOD (PORK) LOCK FLUSH 100 UNIT/ML IV SOLN
250.0000 [IU] | INTRAVENOUS | Status: DC | PRN
  Filled 2019-05-10: qty 2.5

## 2019-05-10 MED ORDER — SODIUM CHLORIDE 0.9 % IV SOLN
850.0000 mg | Freq: Every day | INTRAVENOUS | Status: DC
  Administered 2019-05-10: 10:00:00 850 mg via INTRAVENOUS
  Filled 2019-05-10: qty 17

## 2019-05-10 NOTE — Progress Notes (Signed)
05/10/2019 12:58 PM Discharge AVS meds taken today and those due this evening reviewed.  Follow-up appointments and when to call md reviewed.  D/C IV and TELE.  Questions and concerns addressed.   D/C home per orders. Kathryne Hitch

## 2019-05-10 NOTE — Progress Notes (Signed)
05/09/2019 1320 Received pt to room 4E-12 from ED.  Pt is A&O, no C/O voiced.  Has Arabic interpreter with hi.  Tele monitor applied and CCMD notified.  CHG bath given.  Oriented to room, call light and bed.  Call bell in reach. Kathryne Hitch

## 2019-05-10 NOTE — Progress Notes (Signed)
[facility]  Subjective  -excellent right pedal Doppler signals.  Right iliac stent and right above-knee pop to TP trunk bypass patent on CT yesterday.  Does have about a 3 to 4 cm hematoma in the right calf.  Toes are healing.  Overall he feels good.   Objective 124/77 88 98.1 F (36.7 C) (Oral) 17 100%  Intake/Output Summary (Last 24 hours) at 05/10/2019 0911 Last data filed at 05/10/2019 0248 Gross per 24 hour  Intake -  Output 975 ml  Net -975 ml    Brisk anterior tibial, peroneal, and posterior tibial signals in the right lower extremity. All of his right leg incisions are clean and dry. Some fullness in the right calf but motor and sensory intact.  Laboratory Lab Results: Recent Labs    05/09/19 1039  WBC 6.0  HGB 8.3*  HCT 27.8*  PLT 545*   BMET Recent Labs    05/09/19 1039  NA 136  K 3.8  CL 103  CO2 24  GLUCOSE 104*  BUN 14  CREATININE 0.82  CALCIUM 8.9    COAG Lab Results  Component Value Date   INR 1.1 05/09/2019   INR 1.0 04/22/2019   INR 1.3 (H) 04/13/2019   No results found for: PTT  Assessment/Planning:  44 year old male status post right external iliac stent and right above-knee popliteal to tibioperoneal trunk bypass with reversed ipsilateral saphenous vein for CLI with tissue loss.  Admitted over the weekend with calf hematoma.  Repeat duplex shows no evidence of tibial DVT as previously documented.  We will stop his Eliquis.  He will need to remain on aspirin and Plavix.  Bypass remains patent as well as the right iliac stent on CTA from the weekend.  His toes are healing.  I will arrange follow-up with me in 3 to 4 weeks for wound checks.  I discussed all this with interpreter today.  Plan for discharge home today.  [name] 05/10/2019 9:11 AM --

## 2019-05-10 NOTE — Discharge Summary (Signed)
Discharge Summary  Patient ID: Thomas [name] [id number] 44 y.o. 03/15/1976  Admit date: 05/09/2019  Discharge date and time:  05/10/19  Admitting Physician: Dr. Fields   Discharge Physician: Dr. Clark  Admission Diagnoses: Hematoma [T14.8XXA] Hematoma of leg, right, initial encounter [S80.11XA]  Discharge Diagnoses: same  Admission Condition: fair  Discharged Condition: fair  Indication for Admission: hematoma R leg s/p bypass surgery  Hospital Course: [name] is a 44-year-old male who underwent right iliac artery stenting and right above-the-knee popliteal artery to TP trunk bypass with vein by Dr. Clark on January 7 of this year.  He presented to the emergency department on 05/09/2019 with swelling of right leg and some bleeding from prior drain site.  He was admitted overnight for hematoma of right leg.  CTA during hospitalization demonstrated a patent iliac system as well as bypass.  Patient was also previously diagnosed with DVT of right lower extremity.  Repeat venous duplex no longer showed DVT.  Patient will continue aspirin and Plavix daily.  He can discontinue his Eliquis.  He has been encouraged to elevate his legs much as possible during the day and reassured that drainage of old hematoma blood is expected.  He will follow-up in office with Dr. Clark in about 3 weeks.  He will be discharged home this morning in stable condition.  Consults: None  Treatments: none  Discharge Exam: see progress note 05/10/19 Vitals:   05/09/19 2045 05/10/19 0546  BP: 132/79 124/77  Pulse: 92 88  Resp: 19 17  Temp: 97.9 F (36.6 C) 98.1 F (36.7 C)  SpO2: 99% 100%     Disposition: Discharge disposition: 01-Home or Self Care       Patient Instructions:  Allergies as of 05/10/2019   No Known Allergies     Medication List    STOP taking these medications   apixaban 5 MG Tabs tablet Commonly known as: ELIQUIS     TAKE these medications   aspirin EC 81 MG  tablet Take 1 tablet (81 mg total) by mouth daily.   atorvastatin 10 MG tablet Commonly known as: LIPITOR Take 1 tablet (10 mg total) by mouth daily at 6 PM.   ceftaroline  IVPB Commonly known as: TEFLARO Inject 600 mg into the vein every 8 (eight) hours. Indication:  Persistent MRSA bacteremia and vertebral osteomyelitis  Last Day of Therapy:  06/17/2019 Labs - Once weekly:  CBC/D and BMP, Labs - Every other week:  ESR and CRP What changed: additional instructions   clopidogrel 75 MG tablet Commonly known as: PLAVIX Take 1 tablet (75 mg total) by mouth daily with breakfast.   daptomycin  IVPB Commonly known as: CUBICIN Inject 850 mg into the vein daily. Indication:  Persistent MRSA bacteremia and vertebral osteomyelitis  Last Day of Therapy:  06/17/2019 Labs - Once weekly:  CBC/D, BMP, and CPK Labs - Every other week:  ESR and CRP   gabapentin 100 MG capsule Commonly known as: NEURONTIN TAKE ONE CAPSULE BY MOUTH AT BEDTIME   Heating Pads Pads Use over the counter heating pads as needed for back pain   hydrochlorothiazide 25 MG tablet Commonly known as: HYDRODIURIL TAKE ONE TABLET BY MOUTH DAILY   Lantus SoloStar 100 UNIT/ML Solostar Pen Generic drug: Insulin Glargine 31 units into skin q hs What changed:   how much to take  how to take this  when to take this  additional instructions   lidocaine 5 % Commonly known as: LIDODERM Place 1 patch   onto the skin daily. Remove & Discard patch within 12 hours or as directed by MD   lisinopril 20 MG tablet Commonly known as: ZESTRIL TAKE ONE TABLET BY MOUTH DAILY   metFORMIN 500 MG 24 hr tablet Commonly known as: GLUCOPHAGE-XR TAKE TWO TABLETS BY MOUTH TWICE A DAY   methadone 10 MG tablet Commonly known as: DOLOPHINE Take 10 mg by mouth every 8 (eight) hours as needed (pain).   oxycodone 5 MG capsule Commonly known as: OXY-IR Take 5 mg by mouth 2 (two) times daily as needed for pain.   traMADol 50 MG  tablet Commonly known as: ULTRAM Take 50 mg by mouth 2 (two) times daily as needed for moderate pain.      Activity: activity as tolerated Diet: regular diet Wound Care: keep wound clean and dry  Follow-up with Dr. Carlis Abbott in 3 weeks.  Signed: Dagoberto Ligas, PA-C 05/10/2019 10:24 AM [facility] Office: [phone]

## 2019-05-11 ENCOUNTER — Telehealth: Payer: Self-pay | Admitting: *Deleted

## 2019-05-11 ENCOUNTER — Encounter (HOSPITAL_COMMUNITY)
Admission: RE | Admit: 2019-05-11 | Discharge: 2019-05-11 | Disposition: A | Discharge status: Home / Self Care | Payer: Medicaid Other | Source: Ambulatory Visit | Attending: Family | Admitting: Family

## 2019-05-11 ENCOUNTER — Other Ambulatory Visit: Payer: Self-pay

## 2019-05-11 DIAGNOSIS — M4620 Osteomyelitis of vertebra, site unspecified: Secondary | ICD-10-CM | POA: Diagnosis not present

## 2019-05-11 MED ORDER — HEPARIN SOD (PORK) LOCK FLUSH 100 UNIT/ML IV SOLN
INTRAVENOUS | Status: AC
  Filled 2019-05-11: qty 5

## 2019-05-11 MED ORDER — HEPARIN SOD (PORK) LOCK FLUSH 100 UNIT/ML IV SOLN: 250.0000 [IU] | INTRAVENOUS | Status: DC | PRN

## 2019-05-11 MED ORDER — SODIUM CHLORIDE 0.9 % IV SOLN
850.0000 mg | Freq: Every day | INTRAVENOUS | Status: DC
  Administered 2019-05-11: 850 mg via INTRAVENOUS
  Filled 2019-05-11: qty 17

## 2019-05-11 MED ORDER — HEPARIN SOD (PORK) LOCK FLUSH 100 UNIT/ML IV SOLN
250.0000 [IU] | Freq: Every day | INTRAVENOUS | Status: DC
  Administered 2019-05-11: 250 [IU]

## 2019-05-11 NOTE — Telephone Encounter (Signed)
EDCM consulted to assist with helping patient arrange transportation for 6 week infusion.  EDCM reviewed chart to find that transportation was arranged through [facility].  EDCM called transportation office and was informed that original services stopped 1/24 instead of 3/4 by [employer].  EDCM restarted services (inculding weekends) for pt to arrive at [facility] @ 8:00 M-F and 9:00 on the weekend.  [facility]: [phone]

## 2019-05-12 ENCOUNTER — Encounter (HOSPITAL_COMMUNITY)
Admission: RE | Admit: 2019-05-12 | Discharge: 2019-05-12 | Disposition: A | Discharge status: Home / Self Care | Payer: Medicaid Other | Source: Ambulatory Visit | Attending: Family | Admitting: Family

## 2019-05-12 ENCOUNTER — Other Ambulatory Visit: Payer: Self-pay

## 2019-05-12 DIAGNOSIS — M4620 Osteomyelitis of vertebra, site unspecified: Secondary | ICD-10-CM | POA: Diagnosis not present

## 2019-05-12 MED ORDER — HEPARIN SOD (PORK) LOCK FLUSH 100 UNIT/ML IV SOLN: 250.0000 [IU] | INTRAVENOUS | Status: DC | PRN

## 2019-05-12 MED ORDER — HEPARIN SOD (PORK) LOCK FLUSH 100 UNIT/ML IV SOLN
INTRAVENOUS | Status: AC
  Administered 2019-05-12: 250 [IU]
  Filled 2019-05-12: qty 5

## 2019-05-12 MED ORDER — SODIUM CHLORIDE 0.9 % IV SOLN
850.0000 mg | Freq: Every day | INTRAVENOUS | Status: DC
  Administered 2019-05-12: 09:00:00 850 mg via INTRAVENOUS
  Filled 2019-05-12: qty 17

## 2019-05-12 MED ORDER — HEPARIN SOD (PORK) LOCK FLUSH 100 UNIT/ML IV SOLN: 250.0000 [IU] | Freq: Every day | INTRAVENOUS | Status: DC

## 2019-05-13 ENCOUNTER — Other Ambulatory Visit: Payer: Self-pay

## 2019-05-13 ENCOUNTER — Encounter (HOSPITAL_COMMUNITY)
Admission: RE | Admit: 2019-05-13 | Discharge: 2019-05-13 | Disposition: A | Discharge status: Home / Self Care | Payer: Medicaid Other | Source: Ambulatory Visit | Attending: Family | Admitting: Family

## 2019-05-13 DIAGNOSIS — M4620 Osteomyelitis of vertebra, site unspecified: Secondary | ICD-10-CM | POA: Diagnosis not present

## 2019-05-13 MED ORDER — HEPARIN SOD (PORK) LOCK FLUSH 100 UNIT/ML IV SOLN: 250.0000 [IU] | INTRAVENOUS | Status: DC | PRN

## 2019-05-13 MED ORDER — HEPARIN SOD (PORK) LOCK FLUSH 100 UNIT/ML IV SOLN: 250.0000 [IU] | Freq: Every day | INTRAVENOUS | Status: DC

## 2019-05-13 MED ORDER — SODIUM CHLORIDE 0.9 % IV SOLN
850.0000 mg | Freq: Every day | INTRAVENOUS | Status: DC
  Administered 2019-05-13: 850 mg via INTRAVENOUS
  Filled 2019-05-13: qty 17

## 2019-05-13 MED ORDER — HEPARIN SOD (PORK) LOCK FLUSH 100 UNIT/ML IV SOLN
INTRAVENOUS | Status: AC
  Administered 2019-05-13: 250 [IU]
  Filled 2019-05-13: qty 5

## 2019-05-14 ENCOUNTER — Encounter (HOSPITAL_COMMUNITY)
Admission: RE | Admit: 2019-05-14 | Discharge: 2019-05-14 | Disposition: A | Discharge status: Home / Self Care | Payer: Medicaid Other | Source: Ambulatory Visit | Attending: Family | Admitting: Family

## 2019-05-14 ENCOUNTER — Other Ambulatory Visit: Payer: Self-pay

## 2019-05-14 DIAGNOSIS — M4620 Osteomyelitis of vertebra, site unspecified: Secondary | ICD-10-CM | POA: Diagnosis not present

## 2019-05-14 LAB — CULTURE, BLOOD (ROUTINE X 2)
Culture: NO GROWTH
Culture: NO GROWTH
Special Requests: ADEQUATE

## 2019-05-14 MED ORDER — SODIUM CHLORIDE 0.9 % IV SOLN
850.0000 mg | Freq: Every day | INTRAVENOUS | Status: DC
  Administered 2019-05-14: 850 mg via INTRAVENOUS
  Filled 2019-05-14: qty 17

## 2019-05-14 MED ORDER — HEPARIN SOD (PORK) LOCK FLUSH 100 UNIT/ML IV SOLN
INTRAVENOUS | Status: AC
  Administered 2019-05-14: 09:00:00 250 [IU]
  Filled 2019-05-14: qty 5

## 2019-05-14 MED ORDER — HEPARIN SOD (PORK) LOCK FLUSH 100 UNIT/ML IV SOLN: 250.0000 [IU] | Freq: Every day | INTRAVENOUS | Status: DC

## 2019-05-14 MED ORDER — HEPARIN SOD (PORK) LOCK FLUSH 100 UNIT/ML IV SOLN: 250.0000 [IU] | INTRAVENOUS | Status: DC | PRN

## 2019-05-15 ENCOUNTER — Encounter (HOSPITAL_COMMUNITY)
Admission: RE | Admit: 2019-05-15 | Discharge: 2019-05-15 | Disposition: A | Discharge status: Home / Self Care | Payer: Medicaid Other | Source: Ambulatory Visit | Attending: Family | Admitting: Family

## 2019-05-15 DIAGNOSIS — M4620 Osteomyelitis of vertebra, site unspecified: Secondary | ICD-10-CM | POA: Diagnosis not present

## 2019-05-15 MED ORDER — HEPARIN SOD (PORK) LOCK FLUSH 100 UNIT/ML IV SOLN
250.0000 [IU] | INTRAVENOUS | Status: DC | PRN
  Administered 2019-05-15: 10:00:00 250 [IU]

## 2019-05-15 MED ORDER — SODIUM CHLORIDE 0.9 % IV SOLN
850.0000 mg | Freq: Every day | INTRAVENOUS | Status: DC
  Administered 2019-05-15: 850 mg via INTRAVENOUS
  Filled 2019-05-15: qty 17

## 2019-05-15 MED ORDER — HEPARIN SOD (PORK) LOCK FLUSH 100 UNIT/ML IV SOLN: 250.0000 [IU] | Freq: Every day | INTRAVENOUS | Status: DC

## 2019-05-16 ENCOUNTER — Encounter (HOSPITAL_COMMUNITY)
Admission: RE | Admit: 2019-05-16 | Discharge: 2019-05-16 | Disposition: A | Discharge status: Home / Self Care | Payer: Medicaid Other | Source: Ambulatory Visit | Attending: Family | Admitting: Family

## 2019-05-16 DIAGNOSIS — M4620 Osteomyelitis of vertebra, site unspecified: Secondary | ICD-10-CM | POA: Diagnosis not present

## 2019-05-16 MED ORDER — HEPARIN SOD (PORK) LOCK FLUSH 100 UNIT/ML IV SOLN
250.0000 [IU] | INTRAVENOUS | Status: DC | PRN
  Administered 2019-05-16: 250 [IU]

## 2019-05-16 MED ORDER — SODIUM CHLORIDE 0.9 % IV SOLN
850.0000 mg | Freq: Every day | INTRAVENOUS | Status: DC
  Administered 2019-05-16: 850 mg via INTRAVENOUS
  Filled 2019-05-16: qty 17

## 2019-05-16 MED ORDER — HEPARIN SOD (PORK) LOCK FLUSH 100 UNIT/ML IV SOLN: 250.0000 [IU] | Freq: Every day | INTRAVENOUS | Status: DC

## 2019-05-17 ENCOUNTER — Other Ambulatory Visit: Payer: Self-pay

## 2019-05-17 ENCOUNTER — Encounter (HOSPITAL_COMMUNITY)
Admission: RE | Admit: 2019-05-17 | Discharge: 2019-05-17 | Disposition: A | Discharge status: Home / Self Care | Payer: Medicaid Other | Source: Ambulatory Visit | Attending: Family | Admitting: Family

## 2019-05-17 DIAGNOSIS — M4620 Osteomyelitis of vertebra, site unspecified: Secondary | ICD-10-CM | POA: Diagnosis not present

## 2019-05-17 MED ORDER — SODIUM CHLORIDE 0.9 % IV SOLN
850.0000 mg | Freq: Every day | INTRAVENOUS | Status: DC
  Administered 2019-05-17: 09:00:00 850 mg via INTRAVENOUS
  Filled 2019-05-17: qty 17

## 2019-05-17 MED ORDER — HEPARIN SOD (PORK) LOCK FLUSH 100 UNIT/ML IV SOLN
INTRAVENOUS | Status: AC
  Administered 2019-05-17: 250 [IU]
  Filled 2019-05-17: qty 5

## 2019-05-17 MED ORDER — HEPARIN SOD (PORK) LOCK FLUSH 100 UNIT/ML IV SOLN: 250.0000 [IU] | INTRAVENOUS | Status: DC | PRN

## 2019-05-17 MED ORDER — HEPARIN SOD (PORK) LOCK FLUSH 100 UNIT/ML IV SOLN: 250.0000 [IU] | Freq: Every day | INTRAVENOUS | Status: DC

## 2019-05-18 ENCOUNTER — Encounter (HOSPITAL_COMMUNITY)
Admission: RE | Admit: 2019-05-18 | Discharge: 2019-05-18 | Disposition: A | Discharge status: Home / Self Care | Payer: Medicaid Other | Source: Ambulatory Visit | Attending: Family | Admitting: Family

## 2019-05-18 ENCOUNTER — Other Ambulatory Visit: Payer: Self-pay

## 2019-05-18 ENCOUNTER — Encounter: Payer: No Typology Code available for payment source | Admitting: Vascular Surgery

## 2019-05-18 DIAGNOSIS — M4620 Osteomyelitis of vertebra, site unspecified: Secondary | ICD-10-CM | POA: Diagnosis not present

## 2019-05-18 MED ORDER — SODIUM CHLORIDE 0.9 % IV SOLN
850.0000 mg | Freq: Every day | INTRAVENOUS | Status: DC
  Administered 2019-05-18: 09:00:00 850 mg via INTRAVENOUS
  Filled 2019-05-18: qty 17

## 2019-05-18 MED ORDER — HEPARIN SOD (PORK) LOCK FLUSH 100 UNIT/ML IV SOLN
INTRAVENOUS | Status: AC
  Filled 2019-05-18: qty 5

## 2019-05-18 MED ORDER — HEPARIN SOD (PORK) LOCK FLUSH 100 UNIT/ML IV SOLN: 250.0000 [IU] | INTRAVENOUS | Status: DC | PRN

## 2019-05-18 MED ORDER — HEPARIN SOD (PORK) LOCK FLUSH 100 UNIT/ML IV SOLN
250.0000 [IU] | Freq: Every day | INTRAVENOUS | Status: DC
  Administered 2019-05-18: 09:00:00 250 [IU]

## 2019-05-19 ENCOUNTER — Encounter (HOSPITAL_COMMUNITY)
Admission: RE | Admit: 2019-05-19 | Discharge: 2019-05-19 | Disposition: A | Discharge status: Home / Self Care | Payer: Medicaid Other | Source: Ambulatory Visit | Attending: Family | Admitting: Family

## 2019-05-19 ENCOUNTER — Other Ambulatory Visit: Payer: Self-pay

## 2019-05-19 DIAGNOSIS — M4620 Osteomyelitis of vertebra, site unspecified: Secondary | ICD-10-CM | POA: Diagnosis not present

## 2019-05-19 MED ORDER — HEPARIN SOD (PORK) LOCK FLUSH 100 UNIT/ML IV SOLN
250.0000 [IU] | Freq: Every day | INTRAVENOUS | Status: DC
  Administered 2019-05-19: 250 [IU]

## 2019-05-19 MED ORDER — HEPARIN SOD (PORK) LOCK FLUSH 100 UNIT/ML IV SOLN: 250.0000 [IU] | INTRAVENOUS | Status: DC | PRN

## 2019-05-19 MED ORDER — SODIUM CHLORIDE 0.9 % IV SOLN
850.0000 mg | Freq: Every day | INTRAVENOUS | Status: DC
  Administered 2019-05-19: 09:00:00 850 mg via INTRAVENOUS
  Filled 2019-05-19: qty 17

## 2019-05-19 MED ORDER — HEPARIN SOD (PORK) LOCK FLUSH 100 UNIT/ML IV SOLN
INTRAVENOUS | Status: AC
  Filled 2019-05-19: qty 5

## 2019-05-20 ENCOUNTER — Encounter (HOSPITAL_COMMUNITY)
Admission: RE | Admit: 2019-05-20 | Discharge: 2019-05-20 | Disposition: A | Discharge status: Home / Self Care | Payer: Medicaid Other | Source: Ambulatory Visit | Attending: Family | Admitting: Family

## 2019-05-20 DIAGNOSIS — M4620 Osteomyelitis of vertebra, site unspecified: Secondary | ICD-10-CM | POA: Diagnosis not present

## 2019-05-20 MED ORDER — HEPARIN SOD (PORK) LOCK FLUSH 100 UNIT/ML IV SOLN: 250.0000 [IU] | INTRAVENOUS | Status: DC | PRN

## 2019-05-20 MED ORDER — SODIUM CHLORIDE 0.9 % IV SOLN
850.0000 mg | Freq: Every day | INTRAVENOUS | Status: DC
  Administered 2019-05-20: 850 mg via INTRAVENOUS
  Filled 2019-05-20: qty 17

## 2019-05-20 MED ORDER — HEPARIN SOD (PORK) LOCK FLUSH 100 UNIT/ML IV SOLN
INTRAVENOUS | Status: AC
  Filled 2019-05-20: qty 5

## 2019-05-20 MED ORDER — HEPARIN SOD (PORK) LOCK FLUSH 100 UNIT/ML IV SOLN
250.0000 [IU] | Freq: Every day | INTRAVENOUS | Status: DC
  Administered 2019-05-20: 250 [IU]

## 2019-05-21 ENCOUNTER — Other Ambulatory Visit: Payer: Self-pay

## 2019-05-21 ENCOUNTER — Encounter (HOSPITAL_COMMUNITY)
Admission: RE | Admit: 2019-05-21 | Discharge: 2019-05-21 | Disposition: A | Discharge status: Home / Self Care | Payer: Medicaid Other | Source: Ambulatory Visit | Attending: Family | Admitting: Family

## 2019-05-21 DIAGNOSIS — M4620 Osteomyelitis of vertebra, site unspecified: Secondary | ICD-10-CM | POA: Diagnosis not present

## 2019-05-21 MED ORDER — HEPARIN SOD (PORK) LOCK FLUSH 100 UNIT/ML IV SOLN: 250.0000 [IU] | INTRAVENOUS | Status: DC | PRN

## 2019-05-21 MED ORDER — SODIUM CHLORIDE 0.9 % IV SOLN
850.0000 mg | Freq: Every day | INTRAVENOUS | Status: DC
  Administered 2019-05-21: 850 mg via INTRAVENOUS
  Filled 2019-05-21: qty 17

## 2019-05-21 MED ORDER — HEPARIN SOD (PORK) LOCK FLUSH 100 UNIT/ML IV SOLN
INTRAVENOUS | Status: AC
  Administered 2019-05-21: 250 [IU]
  Filled 2019-05-21: qty 5

## 2019-05-21 MED ORDER — HEPARIN SOD (PORK) LOCK FLUSH 100 UNIT/ML IV SOLN: 250.0000 [IU] | Freq: Every day | INTRAVENOUS | Status: DC

## 2019-05-22 ENCOUNTER — Encounter (HOSPITAL_COMMUNITY)
Admission: RE | Admit: 2019-05-22 | Discharge: 2019-05-22 | Disposition: A | Discharge status: Home / Self Care | Payer: Medicaid Other | Source: Ambulatory Visit | Attending: Family | Admitting: Family

## 2019-05-22 ENCOUNTER — Other Ambulatory Visit: Payer: Self-pay

## 2019-05-22 DIAGNOSIS — M4620 Osteomyelitis of vertebra, site unspecified: Secondary | ICD-10-CM | POA: Diagnosis not present

## 2019-05-22 MED ORDER — HEPARIN SOD (PORK) LOCK FLUSH 100 UNIT/ML IV SOLN: 250.0000 [IU] | Freq: Every day | INTRAVENOUS | Status: DC

## 2019-05-22 MED ORDER — SODIUM CHLORIDE 0.9 % IV SOLN
850.0000 mg | Freq: Every day | INTRAVENOUS | Status: DC
  Administered 2019-05-22: 850 mg via INTRAVENOUS
  Filled 2019-05-22: qty 17

## 2019-05-22 MED ORDER — HEPARIN SOD (PORK) LOCK FLUSH 100 UNIT/ML IV SOLN
250.0000 [IU] | INTRAVENOUS | Status: DC | PRN
  Administered 2019-05-22: 250 [IU]

## 2019-05-23 ENCOUNTER — Encounter (HOSPITAL_COMMUNITY)
Admission: RE | Admit: 2019-05-23 | Discharge: 2019-05-23 | Disposition: A | Discharge status: Home / Self Care | Payer: Medicaid Other | Source: Ambulatory Visit | Attending: Family | Admitting: Family

## 2019-05-23 DIAGNOSIS — M4620 Osteomyelitis of vertebra, site unspecified: Secondary | ICD-10-CM | POA: Diagnosis not present

## 2019-05-23 MED ORDER — HEPARIN SOD (PORK) LOCK FLUSH 100 UNIT/ML IV SOLN: 250.0000 [IU] | Freq: Every day | INTRAVENOUS | Status: DC

## 2019-05-23 MED ORDER — SODIUM CHLORIDE 0.9 % IV SOLN
850.0000 mg | Freq: Every day | INTRAVENOUS | Status: DC
  Administered 2019-05-23: 850 mg via INTRAVENOUS
  Filled 2019-05-23: qty 17

## 2019-05-23 MED ORDER — HEPARIN SOD (PORK) LOCK FLUSH 100 UNIT/ML IV SOLN
250.0000 [IU] | INTRAVENOUS | Status: DC | PRN
  Administered 2019-05-23: 250 [IU]

## 2019-05-24 ENCOUNTER — Other Ambulatory Visit: Payer: Self-pay

## 2019-05-24 ENCOUNTER — Encounter (HOSPITAL_COMMUNITY)
Admission: RE | Admit: 2019-05-24 | Discharge: 2019-05-24 | Disposition: A | Discharge status: Home / Self Care | Payer: Medicaid Other | Source: Ambulatory Visit | Attending: Family | Admitting: Family

## 2019-05-24 DIAGNOSIS — M4620 Osteomyelitis of vertebra, site unspecified: Secondary | ICD-10-CM | POA: Diagnosis not present

## 2019-05-24 MED ORDER — HEPARIN SOD (PORK) LOCK FLUSH 100 UNIT/ML IV SOLN
250.0000 [IU] | INTRAVENOUS | Status: DC | PRN
  Administered 2019-05-24: 250 [IU]

## 2019-05-24 MED ORDER — HEPARIN SOD (PORK) LOCK FLUSH 100 UNIT/ML IV SOLN
INTRAVENOUS | Status: AC
  Filled 2019-05-24: qty 5

## 2019-05-24 MED ORDER — SODIUM CHLORIDE 0.9 % IV SOLN
850.0000 mg | Freq: Every day | INTRAVENOUS | Status: DC
  Administered 2019-05-24: 850 mg via INTRAVENOUS
  Filled 2019-05-24: qty 17

## 2019-05-24 MED ORDER — HEPARIN SOD (PORK) LOCK FLUSH 100 UNIT/ML IV SOLN: 250.0000 [IU] | Freq: Every day | INTRAVENOUS | Status: DC

## 2019-05-25 ENCOUNTER — Encounter (HOSPITAL_COMMUNITY)
Admission: RE | Admit: 2019-05-25 | Discharge: 2019-05-25 | Disposition: A | Discharge status: Home / Self Care | Payer: Medicaid Other | Source: Ambulatory Visit | Attending: Family | Admitting: Family

## 2019-05-25 ENCOUNTER — Other Ambulatory Visit: Payer: Self-pay

## 2019-05-25 DIAGNOSIS — M4620 Osteomyelitis of vertebra, site unspecified: Secondary | ICD-10-CM | POA: Diagnosis not present

## 2019-05-25 MED ORDER — SODIUM CHLORIDE 0.9 % IV SOLN
850.0000 mg | Freq: Every day | INTRAVENOUS | Status: DC
  Administered 2019-05-25: 850 mg via INTRAVENOUS
  Filled 2019-05-25: qty 17

## 2019-05-25 MED ORDER — HEPARIN SOD (PORK) LOCK FLUSH 100 UNIT/ML IV SOLN
INTRAVENOUS | Status: AC
  Filled 2019-05-25: qty 5

## 2019-05-25 MED ORDER — HEPARIN SOD (PORK) LOCK FLUSH 100 UNIT/ML IV SOLN
250.0000 [IU] | Freq: Every day | INTRAVENOUS | Status: DC
  Administered 2019-05-25: 250 [IU]

## 2019-05-25 MED ORDER — HEPARIN SOD (PORK) LOCK FLUSH 100 UNIT/ML IV SOLN: 250.0000 [IU] | INTRAVENOUS | Status: DC | PRN

## 2019-05-26 ENCOUNTER — Ambulatory Visit (INDEPENDENT_AMBULATORY_CARE_PROVIDER_SITE_OTHER): Payer: Self-pay | Admitting: Internal Medicine

## 2019-05-26 ENCOUNTER — Encounter (HOSPITAL_COMMUNITY)
Admission: RE | Admit: 2019-05-26 | Discharge: 2019-05-26 | Disposition: A | Discharge status: Home / Self Care | Payer: Medicaid Other | Source: Ambulatory Visit | Attending: Family | Admitting: Family

## 2019-05-26 ENCOUNTER — Encounter: Payer: Self-pay | Admitting: Internal Medicine

## 2019-05-26 ENCOUNTER — Other Ambulatory Visit: Payer: Self-pay

## 2019-05-26 DIAGNOSIS — M4620 Osteomyelitis of vertebra, site unspecified: Secondary | ICD-10-CM

## 2019-05-26 DIAGNOSIS — I059 Rheumatic mitral valve disease, unspecified: Secondary | ICD-10-CM

## 2019-05-26 DIAGNOSIS — I96 Gangrene, not elsewhere classified: Secondary | ICD-10-CM

## 2019-05-26 DIAGNOSIS — I058 Other rheumatic mitral valve diseases: Secondary | ICD-10-CM

## 2019-05-26 MED ORDER — TRAMADOL HCL 50 MG PO TABS: 50.0000 mg | ORAL_TABLET | Freq: Two times a day (BID) | ORAL | 0 refills | Status: DC | PRN

## 2019-05-26 MED ORDER — HEPARIN SOD (PORK) LOCK FLUSH 100 UNIT/ML IV SOLN: 250.0000 [IU] | Freq: Every day | INTRAVENOUS | Status: DC

## 2019-05-26 MED ORDER — HEPARIN SOD (PORK) LOCK FLUSH 100 UNIT/ML IV SOLN: 250.0000 [IU] | INTRAVENOUS | Status: DC | PRN

## 2019-05-26 MED ORDER — HEPARIN SOD (PORK) LOCK FLUSH 100 UNIT/ML IV SOLN
INTRAVENOUS | Status: AC
  Administered 2019-05-26: 250 [IU]
  Filled 2019-05-26: qty 5

## 2019-05-26 MED ORDER — SODIUM CHLORIDE 0.9 % IV SOLN
850.0000 mg | Freq: Every day | INTRAVENOUS | Status: DC
  Administered 2019-05-26: 850 mg via INTRAVENOUS
  Filled 2019-05-26: qty 17

## 2019-05-26 NOTE — Assessment & Plan Note (Signed)
He is having more pain now but has been out of his pain medication for 3 days.  He says that he was feeling much better with decreasing pain before he ran out of pain medication.  He also says that he has been able to be more active with his walker recently.  I am hopeful that his severe vertebral infection is finally responding to aggressive antibiotic therapy.  He is due to complete 8 weeks of ceftaroline and daptomycin on 06/17/2019.  He will get blood work here today and follow-up with me on 06/17/2019 to determine if he continues IV antibiotic therapy or switch his to oral suppressive antibiotic therapy.

## 2019-05-26 NOTE — Assessment & Plan Note (Signed)
His endocarditis has been cured.

## 2019-05-26 NOTE — Progress Notes (Signed)
[employer] for Infectious Disease  Patient Active Problem List   Diagnosis Date Noted  . Discitis of thoracolumbar region     Priority: High  . MRSA bacteremia     Priority: High  . Endocarditis of mitral valve     Priority: High  . Gangrene of toe of right foot ([place]) 04/14/2019    Priority: High  . Osteomyelitis of spine ([place]) 04/13/2019    Priority: High  . Hematoma 05/09/2019  . Rectal mass 04/14/2019  . DVT of popliteal vein ([place]) 04/14/2019  . Diabetes mellitus type II, uncontrolled ([place]) 01/27/2015  . Diabetic neuropathy ([place]) 01/27/2015  . HTN (hypertension) 01/27/2015  . Depression 01/27/2015  . Hyperlipidemia 01/27/2015    Patient's Medications  New Prescriptions   No medications on file  Previous Medications   ASPIRIN EC 81 MG TABLET    Take 1 tablet (81 mg total) by mouth daily.   ATORVASTATIN (LIPITOR) 10 MG TABLET    Take 1 tablet (10 mg total) by mouth daily at 6 PM.   CEFTAROLINE (TEFLARO) IVPB    Inject 600 mg into the vein every 8 (eight) hours. Indication:  Persistent MRSA bacteremia and vertebral osteomyelitis  Last Day of Therapy:  06/17/2019 Labs - Once weekly:  CBC/D and BMP, Labs - Every other week:  ESR and CRP   CLOPIDOGREL (PLAVIX) 75 MG TABLET    Take 1 tablet (75 mg total) by mouth daily with breakfast.   DAPTOMYCIN (CUBICIN) IVPB    Inject 850 mg into the vein daily. Indication:  Persistent MRSA bacteremia and vertebral osteomyelitis  Last Day of Therapy:  06/17/2019 Labs - Once weekly:  CBC/D, BMP, and CPK Labs - Every other week:  ESR and CRP   GABAPENTIN (NEURONTIN) 100 MG CAPSULE    TAKE ONE CAPSULE BY MOUTH AT BEDTIME   HEATING PADS PADS    Use over the counter heating pads as needed for back pain   HYDROCHLOROTHIAZIDE (HYDRODIURIL) 25 MG TABLET    TAKE ONE TABLET BY MOUTH DAILY   INSULIN GLARGINE (LANTUS SOLOSTAR) 100 UNIT/ML SOLOSTAR PEN    31 units into skin q hs   LIDOCAINE (LIDODERM) 5 %    Place 1 patch onto the skin  daily. Remove & Discard patch within 12 hours or as directed by MD   LISINOPRIL (ZESTRIL) 20 MG TABLET    TAKE ONE TABLET BY MOUTH DAILY   METFORMIN (GLUCOPHAGE-XR) 500 MG 24 HR TABLET    TAKE TWO TABLETS BY MOUTH TWICE A DAY   METHADONE (DOLOPHINE) 10 MG TABLET    Take 10 mg by mouth every 8 (eight) hours as needed (pain).   OXYCODONE (OXY-IR) 5 MG CAPSULE    Take 5 mg by mouth 2 (two) times daily as needed for pain.  Modified Medications   Modified Medication Previous Medication   TRAMADOL (ULTRAM) 50 MG TABLET traMADol (ULTRAM) 50 MG tablet      Take 1 tablet (50 mg total) by mouth 2 (two) times daily as needed for moderate pain.    Take 50 mg by mouth 2 (two) times daily as needed for moderate pain.  Discontinued Medications   No medications on file    Subjective: [name] is in for his hospital follow-up visit. He was hospitalized on 01/31/2019 with MRSA bacteremia complicated by lower thoracic spine infection and mitral valve endocarditis.  He had persistent bacteremia for 2 weeks and was switched from vancomycin to  daptomycin and ceftaroline.  He was discharged on daptomycin alone, completing therapy on 04/04/2019.  He started to feel worse shortly after completing antibiotic therapy and noted some worrisome changes in the toes of his right foot leading to readmission here on 04/14/2019.  Both blood cultures from 04/13/2019 and 1 of 2 from 04/14/2019 have grown MRSA.  MRI showed progressive infection at the T9 level with new discitis at T8 and T10.  MRI of his right foot showed no definite osteomyelitis or abscess.  He underwent TEE which did not show any vegetations or other evidence of persistent endocarditis.    He required vascular surgery on his right leg during that admission.  I elected to restart daptomycin and ceftaroline.  Since discharge she has been receiving daptomycin in the short stay unit at [facility] and ceftaroline at home. He has completed 43 days of antibiotic therapy  in this second course of antibiotics.  He has not had any problems tolerating his antibiotics or PICC.  He says that he was doing much better with improving pain until he ran out of all of his pain medications 3 days ago.  He says that he was taking tramadol but does not know the name of the other pain medications he had at home.  He has not scheduled a follow-up visit with his PCP yet and he is unaware that he is scheduled to see his vascular surgeon, Dr. Carlis Abbott, on 06/01/2019. Review of Systems: Review of Systems  Constitutional: Negative for fever and weight loss.  HENT: Negative for congestion and sore throat.   Respiratory: Negative for cough and shortness of breath.   Cardiovascular: Negative for chest pain.  Gastrointestinal: Negative for abdominal pain, diarrhea, nausea and vomiting.  Musculoskeletal: Positive for back pain.  Skin: Negative for rash.    Past Medical History:  Diagnosis Date  . Depression 01/27/2015  . Diabetes mellitus without complication ([place])   . Hyperlipidemia   . Hypertension     Social History   Tobacco Use  . Smoking status: Former Research scientist (life sciences)  . Smokeless tobacco: Never Used  Substance Use Topics  . Alcohol use: No    Alcohol/week: 0.0 standard drinks  . Drug use: No    No family history on file.  No Known Allergies  Objective: Vitals:   05/26/19 1019  BP: (!) 171/98  Pulse: 88  Temp: 97.6 F (36.4 C)  SpO2: 98%  Weight: 206 lb (93.4 kg)   Body mass index is 29.56 kg/m.  Physical Exam Constitutional:      Comments: He is accompanied by his friend who is serving as his Arabic interpreter.  He is in good spirits.  He looks like he is feeling much better than when I last saw him in the hospital.  Cardiovascular:     Rate and Rhythm: Normal rate and regular rhythm.     Heart sounds: No murmur.  Pulmonary:     Effort: Pulmonary effort is normal.     Breath sounds: Normal breath sounds.  Abdominal:     Palpations: Abdomen is soft.      Tenderness: There is no abdominal tenderness.  Musculoskeletal:     Right lower leg: Edema present.     Comments: All of his surgical incisions are healing nicely.  He tells me that he develops increased swelling of his right lower leg after he has been up for several hours.  He feels like his dry gangrene on his right toes is improving slowly.  Skin:  Findings: No rash.     Comments: His right arm PICC site looks good.  Neurological:     General: No focal deficit present.  Psychiatric:        Mood and Affect: Mood normal.         Lab Results He has not had any lab work drawn since discharge   Problem List Items Addressed This Visit      High   Osteomyelitis of spine ([place])    He is having more pain now but has been out of his pain medication for 3 days.  He says that he was feeling much better with decreasing pain before he ran out of pain medication.  He also says that he has been able to be more active with his walker recently.  I am hopeful that his severe vertebral infection is finally responding to aggressive antibiotic therapy.  He is due to complete 8 weeks of ceftaroline and daptomycin on 06/17/2019.  He will get blood work here today and follow-up with me on 06/17/2019 to determine if he continues IV antibiotic therapy or switch his to oral suppressive antibiotic therapy.      Relevant Orders   CBC   Comprehensive metabolic panel   C-reactive protein   Sedimentation rate   Gangrene of toe of right foot ([place])    His right foot is looking a little better following revascularization of his right leg.  There is no sign of infection.  I make sure that he and his interpreter were aware of his follow-up visit with Dr. Carlis Abbott.      Endocarditis of mitral valve    His endocarditis has been cured.          Michel Bickers, MD [facility] for [facility] [phone] pager   [phone] cell 05/26/2019, 10:56 AM

## 2019-05-26 NOTE — Assessment & Plan Note (Signed)
His right foot is looking a little better following revascularization of his right leg.  There is no sign of infection.  I make sure that he and his interpreter were aware of his follow-up visit with Dr. Chestine Spore.

## 2019-05-27 ENCOUNTER — Encounter (HOSPITAL_COMMUNITY)
Admission: RE | Admit: 2019-05-27 | Discharge: 2019-05-27 | Disposition: A | Discharge status: Home / Self Care | Payer: Medicaid Other | Source: Ambulatory Visit | Attending: Family | Admitting: Family

## 2019-05-27 DIAGNOSIS — M4620 Osteomyelitis of vertebra, site unspecified: Secondary | ICD-10-CM | POA: Diagnosis not present

## 2019-05-27 LAB — COMPREHENSIVE METABOLIC PANEL
AG Ratio: 0.9 (calc) — ABNORMAL LOW (ref 1.0–2.5)
ALT: 14 U/L (ref 9–46)
AST: 13 U/L (ref 10–40)
Albumin: 3.7 g/dL (ref 3.6–5.1)
Alkaline phosphatase (APISO): 65 U/L (ref 36–130)
BUN: 16 mg/dL (ref 7–25)
CO2: 27 mmol/L (ref 20–32)
Calcium: 9.3 mg/dL (ref 8.6–10.3)
Chloride: 104 mmol/L (ref 98–110)
Creat: 0.97 mg/dL (ref 0.60–1.35)
Globulin: 4 g/dL (calc) — ABNORMAL HIGH (ref 1.9–3.7)
Glucose, Bld: 94 mg/dL (ref 65–99)
Potassium: 4.1 mmol/L (ref 3.5–5.3)
Sodium: 137 mmol/L (ref 135–146)
Total Bilirubin: 0.3 mg/dL (ref 0.2–1.2)
Total Protein: 7.7 g/dL (ref 6.1–8.1)

## 2019-05-27 LAB — SEDIMENTATION RATE: Sed Rate: 46 mm/h — ABNORMAL HIGH (ref 0–15)

## 2019-05-27 LAB — CBC
HCT: 33.4 % — ABNORMAL LOW (ref 38.5–50.0)
Hemoglobin: 10.3 g/dL — ABNORMAL LOW (ref 13.2–17.1)
MCH: 24.8 pg — ABNORMAL LOW (ref 27.0–33.0)
MCHC: 30.8 g/dL — ABNORMAL LOW (ref 32.0–36.0)
MCV: 80.3 fL (ref 80.0–100.0)
MPV: 10.1 fL (ref 7.5–12.5)
Platelets: 360 10*3/uL (ref 140–400)
RBC: 4.16 10*6/uL — ABNORMAL LOW (ref 4.20–5.80)
RDW: 15.7 % — ABNORMAL HIGH (ref 11.0–15.0)
WBC: 5.5 10*3/uL (ref 3.8–10.8)

## 2019-05-27 LAB — C-REACTIVE PROTEIN: CRP: 24 mg/L — ABNORMAL HIGH (ref <8.0)

## 2019-05-27 MED ORDER — HEPARIN SOD (PORK) LOCK FLUSH 100 UNIT/ML IV SOLN: 250.0000 [IU] | INTRAVENOUS | Status: DC | PRN

## 2019-05-27 MED ORDER — HEPARIN SOD (PORK) LOCK FLUSH 100 UNIT/ML IV SOLN
250.0000 [IU] | Freq: Every day | INTRAVENOUS | Status: DC
  Administered 2019-05-27: 250 [IU]

## 2019-05-27 MED ORDER — SODIUM CHLORIDE 0.9 % IV SOLN
850.0000 mg | Freq: Every day | INTRAVENOUS | Status: DC
  Administered 2019-05-27: 850 mg via INTRAVENOUS
  Filled 2019-05-27: qty 17

## 2019-05-27 MED ORDER — HEPARIN SOD (PORK) LOCK FLUSH 100 UNIT/ML IV SOLN
INTRAVENOUS | Status: AC
  Filled 2019-05-27: qty 5

## 2019-05-28 ENCOUNTER — Encounter (HOSPITAL_COMMUNITY)
Admission: RE | Admit: 2019-05-28 | Discharge: 2019-05-28 | Disposition: A | Discharge status: Home / Self Care | Payer: Medicaid Other | Source: Ambulatory Visit | Attending: Family | Admitting: Family

## 2019-05-28 DIAGNOSIS — M4620 Osteomyelitis of vertebra, site unspecified: Secondary | ICD-10-CM | POA: Diagnosis not present

## 2019-05-28 MED ORDER — SODIUM CHLORIDE 0.9 % IV SOLN
850.0000 mg | Freq: Every day | INTRAVENOUS | Status: DC
  Administered 2019-05-28: 850 mg via INTRAVENOUS
  Filled 2019-05-28: qty 17

## 2019-05-28 MED ORDER — HEPARIN SOD (PORK) LOCK FLUSH 100 UNIT/ML IV SOLN
250.0000 [IU] | Freq: Every day | INTRAVENOUS | Status: DC
  Administered 2019-05-28: 250 [IU]

## 2019-05-28 MED ORDER — HEPARIN SOD (PORK) LOCK FLUSH 100 UNIT/ML IV SOLN: 250.0000 [IU] | INTRAVENOUS | Status: DC | PRN

## 2019-05-28 MED ORDER — HEPARIN SOD (PORK) LOCK FLUSH 100 UNIT/ML IV SOLN
INTRAVENOUS | Status: AC
  Filled 2019-05-28: qty 5

## 2019-05-29 ENCOUNTER — Other Ambulatory Visit: Payer: Self-pay

## 2019-05-29 ENCOUNTER — Encounter (HOSPITAL_COMMUNITY)
Admission: RE | Admit: 2019-05-29 | Discharge: 2019-05-29 | Disposition: A | Discharge status: Home / Self Care | Payer: Medicaid Other | Source: Ambulatory Visit | Attending: Family | Admitting: Family

## 2019-05-29 DIAGNOSIS — M4620 Osteomyelitis of vertebra, site unspecified: Secondary | ICD-10-CM | POA: Diagnosis not present

## 2019-05-29 MED ORDER — HEPARIN SOD (PORK) LOCK FLUSH 100 UNIT/ML IV SOLN
250.0000 [IU] | Freq: Every day | INTRAVENOUS | Status: DC
  Administered 2019-05-29: 250 [IU]

## 2019-05-29 MED ORDER — SODIUM CHLORIDE 0.9 % IV SOLN
850.0000 mg | Freq: Every day | INTRAVENOUS | Status: DC
  Administered 2019-05-29: 850 mg via INTRAVENOUS
  Filled 2019-05-29: qty 17

## 2019-05-29 MED ORDER — HEPARIN SOD (PORK) LOCK FLUSH 100 UNIT/ML IV SOLN: 250.0000 [IU] | INTRAVENOUS | Status: DC | PRN

## 2019-05-30 ENCOUNTER — Encounter (HOSPITAL_COMMUNITY)
Admission: RE | Admit: 2019-05-30 | Discharge: 2019-05-30 | Disposition: A | Discharge status: Home / Self Care | Payer: Medicaid Other | Source: Ambulatory Visit | Attending: Family | Admitting: Family

## 2019-05-30 DIAGNOSIS — M4620 Osteomyelitis of vertebra, site unspecified: Secondary | ICD-10-CM | POA: Diagnosis not present

## 2019-05-30 MED ORDER — HEPARIN SOD (PORK) LOCK FLUSH 100 UNIT/ML IV SOLN: 250.0000 [IU] | INTRAVENOUS | Status: DC | PRN

## 2019-05-30 MED ORDER — SODIUM CHLORIDE 0.9 % IV SOLN
850.0000 mg | Freq: Every day | INTRAVENOUS | Status: DC
  Administered 2019-05-30: 09:00:00 850 mg via INTRAVENOUS
  Filled 2019-05-30: qty 17

## 2019-05-30 MED ORDER — HEPARIN SOD (PORK) LOCK FLUSH 100 UNIT/ML IV SOLN
250.0000 [IU] | Freq: Every day | INTRAVENOUS | Status: DC
  Administered 2019-05-30: 10:00:00 250 [IU]

## 2019-05-31 ENCOUNTER — Encounter (HOSPITAL_COMMUNITY)
Admission: RE | Admit: 2019-05-31 | Discharge: 2019-05-31 | Disposition: A | Discharge status: Home / Self Care | Payer: Medicaid Other | Source: Ambulatory Visit | Attending: Family | Admitting: Family

## 2019-05-31 ENCOUNTER — Other Ambulatory Visit: Payer: Self-pay

## 2019-05-31 DIAGNOSIS — M4620 Osteomyelitis of vertebra, site unspecified: Secondary | ICD-10-CM | POA: Diagnosis not present

## 2019-05-31 MED ORDER — SODIUM CHLORIDE 0.9 % IV SOLN
850.0000 mg | Freq: Every day | INTRAVENOUS | Status: DC
  Administered 2019-05-31: 850 mg via INTRAVENOUS
  Filled 2019-05-31: qty 17

## 2019-05-31 MED ORDER — HEPARIN SOD (PORK) LOCK FLUSH 100 UNIT/ML IV SOLN: 250.0000 [IU] | INTRAVENOUS | Status: DC | PRN

## 2019-05-31 MED ORDER — HEPARIN SOD (PORK) LOCK FLUSH 100 UNIT/ML IV SOLN
INTRAVENOUS | Status: AC
  Administered 2019-05-31: 11:00:00 250 [IU]
  Filled 2019-05-31: qty 5

## 2019-05-31 MED ORDER — HEPARIN SOD (PORK) LOCK FLUSH 100 UNIT/ML IV SOLN: 250.0000 [IU] | Freq: Every day | INTRAVENOUS | Status: DC

## 2019-06-01 ENCOUNTER — Encounter: Payer: No Typology Code available for payment source | Admitting: Vascular Surgery

## 2019-06-01 ENCOUNTER — Encounter (HOSPITAL_COMMUNITY)
Admission: RE | Admit: 2019-06-01 | Discharge: 2019-06-01 | Disposition: A | Discharge status: Home / Self Care | Payer: Medicaid Other | Source: Ambulatory Visit | Attending: Family | Admitting: Family

## 2019-06-01 DIAGNOSIS — M4620 Osteomyelitis of vertebra, site unspecified: Secondary | ICD-10-CM | POA: Diagnosis not present

## 2019-06-01 MED ORDER — HEPARIN SOD (PORK) LOCK FLUSH 100 UNIT/ML IV SOLN
INTRAVENOUS | Status: AC
  Administered 2019-06-01: 250 [IU]
  Filled 2019-06-01: qty 5

## 2019-06-01 MED ORDER — HEPARIN SOD (PORK) LOCK FLUSH 100 UNIT/ML IV SOLN: 250.0000 [IU] | Freq: Every day | INTRAVENOUS | Status: DC

## 2019-06-01 MED ORDER — SODIUM CHLORIDE 0.9 % IV SOLN
850.0000 mg | Freq: Every day | INTRAVENOUS | Status: DC
  Administered 2019-06-01: 850 mg via INTRAVENOUS
  Filled 2019-06-01: qty 17

## 2019-06-01 MED ORDER — HEPARIN SOD (PORK) LOCK FLUSH 100 UNIT/ML IV SOLN: 250.0000 [IU] | INTRAVENOUS | Status: DC | PRN

## 2019-06-02 ENCOUNTER — Encounter (HOSPITAL_COMMUNITY)
Admission: RE | Admit: 2019-06-02 | Discharge: 2019-06-02 | Disposition: A | Discharge status: Home / Self Care | Payer: Medicaid Other | Source: Ambulatory Visit | Attending: Family | Admitting: Family

## 2019-06-02 DIAGNOSIS — M4620 Osteomyelitis of vertebra, site unspecified: Secondary | ICD-10-CM | POA: Diagnosis not present

## 2019-06-02 MED ORDER — HEPARIN SOD (PORK) LOCK FLUSH 100 UNIT/ML IV SOLN
INTRAVENOUS | Status: AC
  Administered 2019-06-02: 250 [IU]
  Filled 2019-06-02: qty 5

## 2019-06-02 MED ORDER — SODIUM CHLORIDE 0.9 % IV SOLN
850.0000 mg | Freq: Every day | INTRAVENOUS | Status: DC
  Administered 2019-06-02: 850 mg via INTRAVENOUS
  Filled 2019-06-02: qty 17

## 2019-06-02 MED ORDER — HEPARIN SOD (PORK) LOCK FLUSH 100 UNIT/ML IV SOLN: 250.0000 [IU] | Freq: Every day | INTRAVENOUS | Status: DC

## 2019-06-02 MED ORDER — HEPARIN SOD (PORK) LOCK FLUSH 100 UNIT/ML IV SOLN: 250.0000 [IU] | INTRAVENOUS | Status: DC | PRN

## 2019-06-02 NOTE — Progress Notes (Signed)
Attempted to contact Thomas [name]. For tomorrow's appointment. Thomas [name] for the hospital will not be running until noon tomorrow- Called Language line at [phone] to have a translator communicate the change; there was not capacity on the mobile to leave a message (the translator), so, on the advice of the translator, Environmental manager was used to translate the message via text-regarding tomorrow's appointment to reschedule for 1 pm.. Thomas [name] transportation was arranged and will pick the patient up from noon-1230 for the 1 pm appointment. Text sent in Arabic with request of response. Appointment updated in [facility].

## 2019-06-03 ENCOUNTER — Inpatient Hospital Stay (HOSPITAL_COMMUNITY): Admission: RE | Admit: 2019-06-03 | Payer: No Typology Code available for payment source | Source: Ambulatory Visit

## 2019-06-03 ENCOUNTER — Encounter (HOSPITAL_COMMUNITY): Payer: No Typology Code available for payment source

## 2019-06-03 NOTE — Progress Notes (Signed)
[employer] transportation service called and stated they are not running today at all due to the weather. I Called the language line and they were able to reach the patient on the phone.  They translated for me to the patient that [employer] [facility] would not be picking him up today at all, we were cancelling todays appointment, and tomorrow we would call and let him know if they could pick him up or not.  According to the translator the pt verbalized understanding.

## 2019-06-04 ENCOUNTER — Encounter (HOSPITAL_COMMUNITY)
Admission: RE | Admit: 2019-06-04 | Discharge: 2019-06-04 | Disposition: A | Discharge status: Home / Self Care | Payer: Medicaid Other | Source: Ambulatory Visit | Attending: Family | Admitting: Family

## 2019-06-04 DIAGNOSIS — M4620 Osteomyelitis of vertebra, site unspecified: Secondary | ICD-10-CM | POA: Diagnosis not present

## 2019-06-04 MED ORDER — HEPARIN SOD (PORK) LOCK FLUSH 100 UNIT/ML IV SOLN: 250.0000 [IU] | INTRAVENOUS | Status: DC | PRN

## 2019-06-04 MED ORDER — SODIUM CHLORIDE 0.9 % IV SOLN
850.0000 mg | Freq: Every day | INTRAVENOUS | Status: DC
  Administered 2019-06-04: 850 mg via INTRAVENOUS
  Filled 2019-06-04: qty 17

## 2019-06-04 MED ORDER — HEPARIN SOD (PORK) LOCK FLUSH 100 UNIT/ML IV SOLN
250.0000 [IU] | Freq: Every day | INTRAVENOUS | Status: DC
  Administered 2019-06-04: 250 [IU]

## 2019-06-04 MED ORDER — HEPARIN SOD (PORK) LOCK FLUSH 100 UNIT/ML IV SOLN
INTRAVENOUS | Status: AC
  Filled 2019-06-04: qty 5

## 2019-06-04 NOTE — Progress Notes (Signed)
Pt (via interpreter) asked that this nurse call transport service and report driver that picked him up this morning. Patient reported that driver "was rude", dropped him off at the wrong place (Emergency Room, not Main Entrance) but told him "to get out of the car" and that he would refuse to be picked up by this person again. This information reported to [place] at the transportation service whom stated she would report to her manager.

## 2019-06-04 NOTE — Progress Notes (Signed)
Was on the phone for 20 or more minutes with transportation attempting to arrange weekend and next week (02/22-26/21) pick up and return service. At the end of the conversation was instructed by Mardene Celeste to inform company that patient was an [employer] service not a "regular lift" when calling to set up arrangements. Per Mardene Celeste, arrangements confirmed.

## 2019-06-05 ENCOUNTER — Encounter (HOSPITAL_COMMUNITY)
Admission: RE | Admit: 2019-06-05 | Discharge: 2019-06-05 | Disposition: A | Discharge status: Home / Self Care | Payer: Medicaid Other | Source: Ambulatory Visit | Attending: Family | Admitting: Family

## 2019-06-05 DIAGNOSIS — M4620 Osteomyelitis of vertebra, site unspecified: Secondary | ICD-10-CM | POA: Diagnosis not present

## 2019-06-05 MED ORDER — SODIUM CHLORIDE 0.9 % IV SOLN
850.0000 mg | Freq: Every day | INTRAVENOUS | Status: DC
  Administered 2019-06-05: 850 mg via INTRAVENOUS
  Filled 2019-06-05: qty 17

## 2019-06-05 MED ORDER — HEPARIN SOD (PORK) LOCK FLUSH 100 UNIT/ML IV SOLN
250.0000 [IU] | INTRAVENOUS | Status: DC | PRN
  Administered 2019-06-05: 250 [IU]

## 2019-06-06 ENCOUNTER — Encounter (HOSPITAL_COMMUNITY)
Admission: RE | Admit: 2019-06-06 | Discharge: 2019-06-06 | Disposition: A | Discharge status: Home / Self Care | Payer: Medicaid Other | Source: Ambulatory Visit | Attending: Family | Admitting: Family

## 2019-06-06 DIAGNOSIS — M4620 Osteomyelitis of vertebra, site unspecified: Secondary | ICD-10-CM | POA: Diagnosis not present

## 2019-06-06 MED ORDER — HEPARIN SOD (PORK) LOCK FLUSH 100 UNIT/ML IV SOLN
250.0000 [IU] | INTRAVENOUS | Status: DC | PRN
  Administered 2019-06-06: 250 [IU]

## 2019-06-06 MED ORDER — SODIUM CHLORIDE 0.9 % IV SOLN
850.0000 mg | Freq: Every day | INTRAVENOUS | Status: DC
  Administered 2019-06-06: 850 mg via INTRAVENOUS
  Filled 2019-06-06: qty 17

## 2019-06-07 ENCOUNTER — Other Ambulatory Visit: Payer: Self-pay

## 2019-06-07 ENCOUNTER — Encounter (HOSPITAL_COMMUNITY)
Admission: RE | Admit: 2019-06-07 | Discharge: 2019-06-07 | Disposition: A | Discharge status: Home / Self Care | Payer: Medicaid Other | Source: Ambulatory Visit | Attending: Family | Admitting: Family

## 2019-06-07 DIAGNOSIS — M4620 Osteomyelitis of vertebra, site unspecified: Secondary | ICD-10-CM | POA: Diagnosis not present

## 2019-06-07 MED ORDER — SODIUM CHLORIDE 0.9 % IV SOLN
850.0000 mg | Freq: Every day | INTRAVENOUS | Status: DC
  Administered 2019-06-07: 850 mg via INTRAVENOUS
  Filled 2019-06-07: qty 17

## 2019-06-07 MED ORDER — HEPARIN SOD (PORK) LOCK FLUSH 100 UNIT/ML IV SOLN: 250.0000 [IU] | INTRAVENOUS | Status: DC | PRN

## 2019-06-07 MED ORDER — HEPARIN SOD (PORK) LOCK FLUSH 100 UNIT/ML IV SOLN
INTRAVENOUS | Status: AC
  Administered 2019-06-07: 250 [IU]
  Filled 2019-06-07: qty 5

## 2019-06-08 ENCOUNTER — Encounter: Payer: Self-pay | Admitting: Vascular Surgery

## 2019-06-08 ENCOUNTER — Other Ambulatory Visit: Payer: Self-pay

## 2019-06-08 ENCOUNTER — Encounter (HOSPITAL_COMMUNITY)
Admission: RE | Admit: 2019-06-08 | Discharge: 2019-06-08 | Disposition: A | Discharge status: Home / Self Care | Payer: Medicaid Other | Source: Ambulatory Visit | Attending: Family | Admitting: Family

## 2019-06-08 ENCOUNTER — Ambulatory Visit (INDEPENDENT_AMBULATORY_CARE_PROVIDER_SITE_OTHER): Payer: No Typology Code available for payment source | Admitting: Vascular Surgery

## 2019-06-08 DIAGNOSIS — I739 Peripheral vascular disease, unspecified: Secondary | ICD-10-CM

## 2019-06-08 DIAGNOSIS — M4620 Osteomyelitis of vertebra, site unspecified: Secondary | ICD-10-CM | POA: Diagnosis not present

## 2019-06-08 MED ORDER — HEPARIN SOD (PORK) LOCK FLUSH 100 UNIT/ML IV SOLN
INTRAVENOUS | Status: AC
  Administered 2019-06-08: 250 [IU]
  Filled 2019-06-08: qty 5

## 2019-06-08 MED ORDER — SODIUM CHLORIDE 0.9 % IV SOLN
850.0000 mg | Freq: Every day | INTRAVENOUS | Status: DC
  Administered 2019-06-08: 09:00:00 850 mg via INTRAVENOUS
  Filled 2019-06-08: qty 17

## 2019-06-08 MED ORDER — HEPARIN SOD (PORK) LOCK FLUSH 100 UNIT/ML IV SOLN: 250.0000 [IU] | INTRAVENOUS | Status: DC | PRN

## 2019-06-08 NOTE — Progress Notes (Signed)
Patient name: Thomas [name] MRN: [id number] DOB: [birth year] Sex: male  REASON FOR VISIT: 2 to 3-week postop check  HPI: Thomas [name] is a 44 y.o. male with history of diabetes, hypertension, hyperlipidemia that presents for postop check after right lower extremity revascularization.  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  Ultimately underwent right external iliac angioplasty and stent on 04/19/2019.  Subsequently got a right above-knee pop to TP trunk bypass with reversed great saphenous vein on 04/22/2019.  Ultimately through the interpreter his toes are continuing to heal.  He still has a PICC line getting antibiotics for his bacteremia and discitis.  He has no specific concerns other than some mild leg swelling in his right leg but he is elevating his leg.  Past Medical History:  Diagnosis Date  . Depression 01/27/2015  . Diabetes mellitus without complication ([place])   . Hyperlipidemia   . Hypertension     Past Surgical History:  Procedure Laterality Date  . ABDOMINAL AORTOGRAM W/LOWER EXTREMITY Right 04/19/2019   Procedure: ABDOMINAL AORTOGRAM W/LOWER EXTREMITY;  Surgeon: Marty Heck, MD;  Location: [facility];  Service: Cardiovascular;  Laterality: Right;  . FEMORAL-TIBIAL BYPASS GRAFT Right 04/22/2019   Procedure: Right  FEMORAL-TIBIAL ARTERY BYPASS GRAFT with [place] of Saphenous vein RIGHT SUPERFICIAL FEMORAL ARTERY TO TIBIOPERONEAL TRUNK BYPASS;  Surgeon: Marty Heck, MD;  Location: [facility];  Service: Vascular;  Laterality: Right;  . IR CERVICAL/THORACIC [place] W/IMAG GUIDE  04/15/2019  . IR FLUORO GUIDED NEEDLE PLC ASPIRATION/INJECTION LOC  04/15/2019  . kidney stones  2009  . PERIPHERAL VASCULAR INTERVENTION  04/19/2019   Procedure: PERIPHERAL VASCULAR INTERVENTION;  Surgeon: Marty Heck, MD;  Location: [facility];  Service: Cardiovascular;;  right external iliac  . TEE  WITHOUT CARDIOVERSION N/A 04/19/2019   Procedure: TRANSESOPHAGEAL ECHOCARDIOGRAM (TEE);  Surgeon: Donato Heinz, MD;  Location: [facility];  Service: Endoscopy;  Laterality: N/A;    History reviewed. No pertinent family history.  SOCIAL HISTORY: Social History   Tobacco Use  . Smoking status: Former Research scientist (life sciences)  . Smokeless tobacco: Never Used  Substance Use Topics  . Alcohol use: No    Alcohol/week: 0.0 standard drinks    No Known Allergies  Current Outpatient Medications  Medication Sig Dispense Refill  . atorvastatin (LIPITOR) 10 MG tablet Take 1 tablet (10 mg total) by mouth daily at 6 PM. 30 tablet 2  . clopidogrel (PLAVIX) 75 MG tablet Take 1 tablet (75 mg total) by mouth daily with breakfast. 30 tablet 2  . gabapentin (NEURONTIN) 100 MG capsule TAKE ONE CAPSULE BY MOUTH AT BEDTIME 30 capsule 0  . lidocaine (LIDODERM) 5 % Place 1 patch onto the skin daily. Remove & Discard patch within 12 hours or as directed by MD 30 patch 0  . lisinopril (ZESTRIL) 20 MG tablet TAKE ONE TABLET BY MOUTH DAILY 30 tablet 0  . oxycodone (OXY-IR) 5 MG capsule Take 5 mg by mouth 2 (two) times daily as needed for pain.    . traMADol (ULTRAM) 50 MG tablet Take 1 tablet (50 mg total) by mouth 2 (two) times daily as needed for moderate pain. 60 tablet 0  . aspirin EC 81 MG tablet Take 1 tablet (81 mg total) by mouth daily. (Patient not taking: Reported on 06/08/2019) 150 tablet 2  . ceftaroline (TEFLARO) IVPB Inject 600 mg into the vein every 8 (eight) hours. Indication:  Persistent MRSA bacteremia and  vertebral osteomyelitis  Last Day of Therapy:  06/17/2019 Labs - Once weekly:  CBC/D and BMP, Labs - Every other week:  ESR and CRP (Patient taking differently: Inject 600 mg into the vein every 8 (eight) hours. Indication:  Persistent MRSA bacteremia and vertebral osteomyelitis  Last Day of Therapy:  06/17/2019 Labs - Once weekly:  CBC/D and BMP, Labs - Every other week:  ESR and CRP (administered via  IV port in right arm - 10am, 6pm and 2am)) 147 Units 0  . daptomycin (CUBICIN) IVPB Inject 850 mg into the vein daily. Indication:  Persistent MRSA bacteremia and vertebral osteomyelitis  Last Day of Therapy:  06/17/2019 Labs - Once weekly:  CBC/D, BMP, and CPK Labs - Every other week:  ESR and CRP 49 Units 0  . Heating Pads PADS Use over the counter heating pads as needed for back pain 1 Units 5  . hydrochlorothiazide (HYDRODIURIL) 25 MG tablet TAKE ONE TABLET BY MOUTH DAILY (Patient not taking: No sig reported) 30 tablet 0  . Insulin Glargine (LANTUS SOLOSTAR) 100 UNIT/ML Solostar Pen 31 units into skin q hs (Patient taking differently: Inject 31 Units into the skin at bedtime. ) 5 pen 3  . metFORMIN (GLUCOPHAGE-XR) 500 MG 24 hr tablet TAKE TWO TABLETS BY MOUTH TWICE A DAY (Patient not taking: No sig reported) 120 tablet 0  . methadone (DOLOPHINE) 10 MG tablet Take 10 mg by mouth every 8 (eight) hours as needed (pain).     No current facility-administered medications for this visit.   Facility-Administered Medications Ordered in Other Visits  Medication Dose Route Frequency Provider Last Rate Last Admin  . DAPTOmycin (CUBICIN) 850 mg in sodium chloride 0.9 % IVPB  850 mg Intravenous Q2000 [place] [address], FNP   Stopped at 06/08/19 0920    REVIEW OF SYSTEMS:  _0  denotes positive finding, _1  denotes negative finding Cardiac  Comments:  Chest pain or chest pressure:    Shortness of breath upon exertion:    Short of breath when lying flat:    Irregular heart rhythm:        Vascular    Pain in calf, thigh, or hip brought on by ambulation:    Pain in feet at night that wakes you up from your sleep:     Blood clot in your veins:    Leg swelling:         Pulmonary    Oxygen at home:    Productive cough:     Wheezing:         Neurologic    Sudden weakness in arms or legs:     Sudden numbness in arms or legs:     Sudden onset of difficulty speaking or slurred speech:    Temporary  loss of vision in one eye:     Problems with dizziness:         Gastrointestinal    Blood in stool:     Vomited blood:         Genitourinary    Burning when urinating:     Blood in urine:        Psychiatric    Major depression:         Hematologic    Bleeding problems:    Problems with blood clotting too easily:        Skin    Rashes or ulcers:        Constitutional    Fever or chills:  PHYSICAL EXAM: Vitals:   06/08/19 1559  BP: (!) 159/97  Pulse: (!) 115  Resp: 18  Temp: (!) 97.5 F (36.4 C)  TempSrc: Temporal  SpO2: 99%    GENERAL: The patient is a well-nourished male, in no acute distress. The vital signs are documented above. CARDIAC: There is a regular rate and rhythm.  VASCULAR:  Palpable right femoral pulse Right right PT signal Right leg incisions well healed No left leg issues at this time       DATA:   None  Assessment/Plan:   44 y.o. male s/p right leg revascularization.  Ultimately underwent right external iliac angioplasty and stent on 04/19/2019.  Subsequently got a right above-knee pop to TP trunk bypass with reversed great saphenous vein on 04/22/2019.   Very pleased with his progress.  He has excellent posterior tibial signal on exam consistent with a patent bypass.  His toes are healing.  I will have him follow-up in 3 months with ABIs and right leg arterial duplex.  I discussed with him through the interpreter that if his toes start showing any signs of worsening he needs let our office know.  I am optimistic that most of these will heal with time.   Marty Heck, MD [facility] Office: [phone]

## 2019-06-09 ENCOUNTER — Telehealth: Payer: Self-pay

## 2019-06-09 ENCOUNTER — Encounter (HOSPITAL_COMMUNITY)
Admission: RE | Admit: 2019-06-09 | Discharge: 2019-06-09 | Disposition: A | Discharge status: Home / Self Care | Payer: Medicaid Other | Source: Ambulatory Visit | Attending: Family | Admitting: Family

## 2019-06-09 ENCOUNTER — Other Ambulatory Visit: Payer: Self-pay | Admitting: *Deleted

## 2019-06-09 DIAGNOSIS — I739 Peripheral vascular disease, unspecified: Secondary | ICD-10-CM

## 2019-06-09 DIAGNOSIS — M4620 Osteomyelitis of vertebra, site unspecified: Secondary | ICD-10-CM | POA: Diagnosis not present

## 2019-06-09 MED ORDER — HEPARIN SOD (PORK) LOCK FLUSH 100 UNIT/ML IV SOLN
INTRAVENOUS | Status: AC
  Administered 2019-06-09: 09:00:00 250 [IU]
  Filled 2019-06-09: qty 5

## 2019-06-09 MED ORDER — HEPARIN SOD (PORK) LOCK FLUSH 100 UNIT/ML IV SOLN: 250.0000 [IU] | INTRAVENOUS | Status: DC | PRN

## 2019-06-09 MED ORDER — SODIUM CHLORIDE 0.9 % IV SOLN
850.0000 mg | Freq: Every day | INTRAVENOUS | Status: DC
  Administered 2019-06-09: 850 mg via INTRAVENOUS
  Filled 2019-06-09: qty 17

## 2019-06-09 NOTE — Telephone Encounter (Signed)
Received call from [facility] stating patient has 10 extra doses of medication left on hand. Patient end date of IV therapy is 06/17/19. Per [facility] patient states he was not able to go to infusions during icy weather.  Routing to provider to make aware. Valarie Cones

## 2019-06-09 NOTE — Telephone Encounter (Signed)
He is scheduled to follow-up with me on 06/17/2019.  I will make a decision then whether or not to extend his IV daptomycin and ceftaroline.

## 2019-06-10 ENCOUNTER — Encounter (HOSPITAL_COMMUNITY)
Admission: RE | Admit: 2019-06-10 | Discharge: 2019-06-10 | Disposition: A | Discharge status: Home / Self Care | Payer: Medicaid Other | Source: Ambulatory Visit | Attending: Family | Admitting: Family

## 2019-06-10 ENCOUNTER — Other Ambulatory Visit: Payer: Self-pay

## 2019-06-10 ENCOUNTER — Telehealth: Payer: Self-pay | Admitting: Medical

## 2019-06-10 DIAGNOSIS — M4620 Osteomyelitis of vertebra, site unspecified: Secondary | ICD-10-CM | POA: Diagnosis not present

## 2019-06-10 MED ORDER — LANCETS MISC: 1 refills | Status: DC

## 2019-06-10 MED ORDER — SODIUM CHLORIDE 0.9 % IV SOLN
850.0000 mg | Freq: Every day | INTRAVENOUS | Status: DC
  Administered 2019-06-10: 09:00:00 850 mg via INTRAVENOUS
  Filled 2019-06-10: qty 10

## 2019-06-10 MED ORDER — HEPARIN SOD (PORK) LOCK FLUSH 100 UNIT/ML IV SOLN
250.0000 [IU] | INTRAVENOUS | Status: DC | PRN
  Administered 2019-06-10: 250 [IU]

## 2019-06-10 MED ORDER — LANTUS SOLOSTAR 100 UNIT/ML ~~LOC~~ SOPN: 31.0000 [IU] | PEN_INJECTOR | Freq: Every day | SUBCUTANEOUS | 2 refills | Status: DC

## 2019-06-10 MED ORDER — HEPARIN SOD (PORK) LOCK FLUSH 100 UNIT/ML IV SOLN
INTRAVENOUS | Status: AC
  Filled 2019-06-10: qty 5

## 2019-06-10 NOTE — Telephone Encounter (Signed)
Rx sent in

## 2019-06-10 NOTE — Addendum Note (Signed)
Addended by: Thelma Barge D on: 06/10/2019 03:59 PM   Modules accepted: Orders

## 2019-06-10 NOTE — Telephone Encounter (Signed)
Pt request for Lantus. He states he checks his sugar once a da. He's requesting to have it sent to [facility] in [facility]

## 2019-06-11 ENCOUNTER — Inpatient Hospital Stay (HOSPITAL_COMMUNITY): Admission: RE | Admit: 2019-06-11 | Payer: No Typology Code available for payment source | Source: Ambulatory Visit

## 2019-06-12 ENCOUNTER — Encounter (HOSPITAL_COMMUNITY)
Admission: RE | Admit: 2019-06-12 | Discharge: 2019-06-12 | Disposition: A | Discharge status: Home / Self Care | Payer: Medicaid Other | Source: Ambulatory Visit | Attending: Family | Admitting: Family

## 2019-06-12 DIAGNOSIS — M4620 Osteomyelitis of vertebra, site unspecified: Secondary | ICD-10-CM | POA: Diagnosis not present

## 2019-06-12 MED ORDER — SODIUM CHLORIDE 0.9 % IV SOLN
850.0000 mg | Freq: Every day | INTRAVENOUS | Status: DC
  Administered 2019-06-12: 850 mg via INTRAVENOUS
  Filled 2019-06-12: qty 17

## 2019-06-12 MED ORDER — HEPARIN SOD (PORK) LOCK FLUSH 100 UNIT/ML IV SOLN
250.0000 [IU] | INTRAVENOUS | Status: DC | PRN
  Administered 2019-06-12: 250 [IU]

## 2019-06-13 ENCOUNTER — Encounter (HOSPITAL_COMMUNITY)
Admission: RE | Admit: 2019-06-13 | Discharge: 2019-06-13 | Disposition: A | Discharge status: Home / Self Care | Payer: Medicaid Other | Source: Ambulatory Visit | Attending: Family | Admitting: Family

## 2019-06-13 DIAGNOSIS — M4620 Osteomyelitis of vertebra, site unspecified: Secondary | ICD-10-CM | POA: Diagnosis not present

## 2019-06-13 MED ORDER — SODIUM CHLORIDE 0.9 % IV SOLN
850.0000 mg | Freq: Every day | INTRAVENOUS | Status: DC
  Administered 2019-06-13: 09:00:00 850 mg via INTRAVENOUS
  Filled 2019-06-13: qty 17

## 2019-06-13 MED ORDER — HEPARIN SOD (PORK) LOCK FLUSH 100 UNIT/ML IV SOLN
250.0000 [IU] | INTRAVENOUS | Status: DC | PRN
  Administered 2019-06-13: 250 [IU]

## 2019-06-14 ENCOUNTER — Other Ambulatory Visit: Payer: Self-pay

## 2019-06-14 ENCOUNTER — Encounter (HOSPITAL_COMMUNITY)
Admission: RE | Admit: 2019-06-14 | Discharge: 2019-06-14 | Disposition: A | Discharge status: Home / Self Care | Payer: Medicaid Other | Source: Ambulatory Visit | Attending: Family | Admitting: Family

## 2019-06-14 DIAGNOSIS — M4620 Osteomyelitis of vertebra, site unspecified: Secondary | ICD-10-CM | POA: Insufficient documentation

## 2019-06-14 MED ORDER — SODIUM CHLORIDE 0.9 % IV SOLN
850.0000 mg | Freq: Every day | INTRAVENOUS | Status: DC
  Administered 2019-06-14: 850 mg via INTRAVENOUS
  Filled 2019-06-14: qty 17

## 2019-06-14 MED ORDER — HEPARIN SOD (PORK) LOCK FLUSH 100 UNIT/ML IV SOLN
INTRAVENOUS | Status: AC
  Filled 2019-06-14: qty 5

## 2019-06-14 MED ORDER — HEPARIN SOD (PORK) LOCK FLUSH 100 UNIT/ML IV SOLN
250.0000 [IU] | INTRAVENOUS | Status: DC | PRN
  Administered 2019-06-14: 250 [IU]

## 2019-06-15 ENCOUNTER — Encounter (HOSPITAL_COMMUNITY)
Admission: RE | Admit: 2019-06-15 | Discharge: 2019-06-15 | Disposition: A | Discharge status: Home / Self Care | Payer: Medicaid Other | Source: Ambulatory Visit | Attending: Family | Admitting: Family

## 2019-06-15 ENCOUNTER — Other Ambulatory Visit: Payer: Self-pay

## 2019-06-15 DIAGNOSIS — M4620 Osteomyelitis of vertebra, site unspecified: Secondary | ICD-10-CM | POA: Diagnosis not present

## 2019-06-15 MED ORDER — SODIUM CHLORIDE 0.9 % IV SOLN
850.0000 mg | Freq: Every day | INTRAVENOUS | Status: DC
  Administered 2019-06-15: 850 mg via INTRAVENOUS
  Filled 2019-06-15: qty 17

## 2019-06-15 MED ORDER — HEPARIN SOD (PORK) LOCK FLUSH 100 UNIT/ML IV SOLN: 250.0000 [IU] | INTRAVENOUS | Status: DC | PRN

## 2019-06-15 MED ORDER — HEPARIN SOD (PORK) LOCK FLUSH 100 UNIT/ML IV SOLN
INTRAVENOUS | Status: AC
  Administered 2019-06-15: 250 [IU]
  Filled 2019-06-15: qty 5

## 2019-06-16 ENCOUNTER — Encounter (HOSPITAL_COMMUNITY)
Admission: RE | Admit: 2019-06-16 | Discharge: 2019-06-16 | Disposition: A | Discharge status: Home / Self Care | Payer: Medicaid Other | Source: Ambulatory Visit | Attending: Family | Admitting: Family

## 2019-06-16 ENCOUNTER — Other Ambulatory Visit: Payer: Self-pay

## 2019-06-16 DIAGNOSIS — M4620 Osteomyelitis of vertebra, site unspecified: Secondary | ICD-10-CM | POA: Diagnosis not present

## 2019-06-16 MED ORDER — HEPARIN SOD (PORK) LOCK FLUSH 100 UNIT/ML IV SOLN
250.0000 [IU] | INTRAVENOUS | Status: DC | PRN
  Administered 2019-06-16: 250 [IU]

## 2019-06-16 MED ORDER — SODIUM CHLORIDE 0.9 % IV SOLN
850.0000 mg | Freq: Every day | INTRAVENOUS | Status: DC
  Administered 2019-06-16: 850 mg via INTRAVENOUS
  Filled 2019-06-16: qty 17

## 2019-06-16 MED ORDER — HEPARIN SOD (PORK) LOCK FLUSH 100 UNIT/ML IV SOLN
INTRAVENOUS | Status: AC
  Filled 2019-06-16: qty 5

## 2019-06-17 ENCOUNTER — Other Ambulatory Visit: Payer: Self-pay

## 2019-06-17 ENCOUNTER — Telehealth: Payer: Self-pay

## 2019-06-17 ENCOUNTER — Encounter: Payer: Self-pay | Admitting: Internal Medicine

## 2019-06-17 ENCOUNTER — Encounter (HOSPITAL_COMMUNITY)
Admission: RE | Admit: 2019-06-17 | Discharge: 2019-06-17 | Disposition: A | Discharge status: Home / Self Care | Payer: Medicaid Other | Source: Ambulatory Visit | Attending: Family | Admitting: Family

## 2019-06-17 ENCOUNTER — Ambulatory Visit (INDEPENDENT_AMBULATORY_CARE_PROVIDER_SITE_OTHER): Payer: Self-pay | Admitting: Internal Medicine

## 2019-06-17 DIAGNOSIS — M4620 Osteomyelitis of vertebra, site unspecified: Secondary | ICD-10-CM | POA: Diagnosis not present

## 2019-06-17 DIAGNOSIS — B9562 Methicillin resistant Staphylococcus aureus infection as the cause of diseases classified elsewhere: Secondary | ICD-10-CM

## 2019-06-17 DIAGNOSIS — R7881 Bacteremia: Secondary | ICD-10-CM

## 2019-06-17 DIAGNOSIS — I96 Gangrene, not elsewhere classified: Secondary | ICD-10-CM

## 2019-06-17 MED ORDER — HEPARIN SOD (PORK) LOCK FLUSH 100 UNIT/ML IV SOLN
INTRAVENOUS | Status: AC
  Filled 2019-06-17: qty 5

## 2019-06-17 MED ORDER — DOXYCYCLINE HYCLATE 100 MG PO TABS: 100.0000 mg | ORAL_TABLET | Freq: Two times a day (BID) | ORAL | 3 refills | Status: DC

## 2019-06-17 MED ORDER — SODIUM CHLORIDE 0.9 % IV SOLN
850.0000 mg | Freq: Every day | INTRAVENOUS | Status: DC
  Administered 2019-06-17: 850 mg via INTRAVENOUS
  Filled 2019-06-17: qty 17

## 2019-06-17 MED ORDER — HEPARIN SOD (PORK) LOCK FLUSH 100 UNIT/ML IV SOLN
250.0000 [IU] | INTRAVENOUS | Status: AC | PRN
  Administered 2019-06-17: 250 [IU]

## 2019-06-17 NOTE — Telephone Encounter (Signed)
Called [facility] to inform that picc line was removed today in office. Spoke with Eunice Blase who verbalized understanding. Lorenso Courier, [place]

## 2019-06-17 NOTE — Progress Notes (Signed)
[facility]  Patient Active Problem List   Diagnosis Date Noted  . Discitis of thoracolumbar region     Priority: High  . MRSA bacteremia     Priority: High  . Endocarditis of mitral valve     Priority: High  . Gangrene of toe of right foot ([place]) 04/14/2019    Priority: High  . 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 PAD (peripheral artery disease) ([place]) 06/08/2019  . Hematoma 05/09/2019  . Rectal mass 04/14/2019  . DVT of popliteal vein ([place]) 04/14/2019  . Diabetes mellitus type II, uncontrolled ([place]) 01/27/2015  . Diabetic neuropathy ([place]) 01/27/2015  . HTN (hypertension) 01/27/2015  . Depression 01/27/2015  . Hyperlipidemia 01/27/2015    Patient's Medications  New Prescriptions   DOXYCYCLINE (VIBRA-TABS) 100 MG TABLET    Take 1 tablet (100 mg total) by mouth 2 (two) times daily.  Previous Medications   ASPIRIN EC 81 MG TABLET    Take 1 tablet (81 mg total) by mouth daily.   ATORVASTATIN (LIPITOR) 10 MG TABLET    Take 1 tablet (10 mg total) by mouth daily at 6 PM.   CLOPIDOGREL (PLAVIX) 75 MG TABLET    Take 1 tablet (75 mg total) by mouth daily with breakfast.   GABAPENTIN (NEURONTIN) 100 MG CAPSULE    TAKE ONE CAPSULE BY MOUTH AT BEDTIME   HEATING PADS PADS    Use over the counter heating pads as needed for back pain   HYDROCHLOROTHIAZIDE (HYDRODIURIL) 25 MG TABLET    TAKE ONE TABLET BY MOUTH DAILY   INSULIN GLARGINE (LANTUS SOLOSTAR) 100 UNIT/ML SOLOSTAR PEN    Inject 31 Units into the skin at bedtime.   LANCETS MISC    Use to check sugar once a day.  Dx E11.9   LIDOCAINE (LIDODERM) 5 %    Place 1 patch onto the skin daily. Remove & Discard patch within 12 hours or as directed by MD   LISINOPRIL (ZESTRIL) 20 MG TABLET    TAKE ONE TABLET BY MOUTH DAILY   METFORMIN (GLUCOPHAGE-XR) 500 MG 24 HR TABLET    TAKE TWO TABLETS BY MOUTH TWICE A DAY   TRAMADOL (ULTRAM) 50 MG TABLET    Take 1 tablet (50 mg total) by mouth 2  (two) times daily as needed for moderate pain.  Modified Medications   No medications on file  Discontinued Medications   CEFTAROLINE (TEFLARO) IVPB    Inject 600 mg into the vein every 8 (eight) hours. Indication:  Persistent MRSA bacteremia and vertebral osteomyelitis  Last Day of Therapy:  06/17/2019 Labs - Once weekly:  CBC/D and BMP, Labs - Every other week:  ESR and CRP   DAPTOMYCIN (CUBICIN) IVPB    Inject 850 mg into the vein daily. Indication:  Persistent MRSA bacteremia and vertebral osteomyelitis  Last Day of Therapy:  06/17/2019 Labs - Once weekly:  CBC/D, BMP, and CPK Labs - Every other week:  ESR and CRP   METHADONE (DOLOPHINE) 10 MG TABLET    Take 10 mg by mouth every 8 (eight) hours as needed (pain).   OXYCODONE (OXY-IR) 5 MG CAPSULE    Take 5 mg by mouth 2 (two) times daily as needed for pain.    Subjective: Thomas [name] is in for his routine follow-up visit. He was hospitalized on 01/31/2019 with MRSA bacteremia complicated by lower thoracic spine infection and mitral valve endocarditis.  He had persistent bacteremia for 2 weeks and was switched from vancomycin to daptomycin and ceftaroline.  He was discharged on daptomycin alone, completing therapy on 04/04/2019.    He started to feel worse shortly after completing antibiotic therapy and noted some worrisome changes in the toes of his right foot leading to readmission here on 04/14/2019.  Both blood cultures from 04/13/2019 and 1 of 2 from 04/14/2019 have grown MRSA.  MRI showed progressive infection at the T9 level with new discitis at T8 and T10.  MRI of his right foot showed no definite osteomyelitis or abscess.  He underwent TEE which did not show any vegetations or other evidence of persistent endocarditis.    He required vascular surgery on his right leg during that admission.  I elected to restart daptomycin and ceftaroline.  Since discharge she has been receiving daptomycin in the short stay unit at [facility] and  ceftaroline at home. He has completed 8 weeks of antibiotic therapy in this second course of antibiotics.  He has not had any problems tolerating his antibiotics or PICC.    He is improving steadily.  His pain is better and he is now only requiring occasional tramadol.  He does not take it every day.  His right foot pain has resolved.  Unfortunately it appears that he missed his appointment with his vascular surgeon, Dr. Carlis Abbott, on 06/01/2019.  He has not been able to schedule a visit with his PCP because it takes too long to arrange that over the phone given the language difficulties.  Review of Systems: Review of Systems  Constitutional: Negative for fever and weight loss.  HENT: Negative for congestion and sore throat.   Respiratory: Negative for cough and shortness of breath.   Cardiovascular: Negative for chest pain.  Gastrointestinal: Negative for abdominal pain, diarrhea, nausea and vomiting.  Musculoskeletal: Positive for back pain.  Skin: Negative for rash.    Past Medical History:  Diagnosis Date  . Depression 01/27/2015  . Diabetes mellitus without complication ([place])   . Hyperlipidemia   . Hypertension     Social History   Tobacco Use  . Smoking status: Former Research scientist (life sciences)  . Smokeless tobacco: Never Used  Substance Use Topics  . Alcohol use: No    Alcohol/week: 0.0 standard drinks  . Drug use: No    No family history on file.  No Known Allergies  Objective: Vitals:   06/17/19 1003  BP: (!) 144/85  Pulse: 99  Temp: 98.5 F (36.9 C)  TempSrc: Oral  Weight: 206 lb (93.4 kg)   Body mass index is 29.56 kg/m.  Physical Exam Constitutional:      Comments: He is by himself today.  I interviewed him with the aid of the video interpreter.  He is in very good spirits and seems much more comfortable and happy.  Cardiovascular:     Rate and Rhythm: Normal rate and regular rhythm.     Heart sounds: No murmur.  Pulmonary:     Effort: Pulmonary effort is normal.      Breath sounds: Normal breath sounds.  Abdominal:     Palpations: Abdomen is soft.     Tenderness: There is no abdominal tenderness.  Musculoskeletal:     Right lower leg: Edema present.     Comments: All of his surgical incisions have healed nicely.  His right leg edema has decreased.  He continues to have some dry gangrene over the tip of his right second toe that is unchanged.  There is no surrounding cellulitis there is no drainage or odor.  He has a callus on the tip of his right third toe.  Skin:    Findings: No rash.     Comments: His right arm PICC site looks good.  Neurological:     General: No focal deficit present.  Psychiatric:        Mood and Affect: Mood normal.     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  We will draw inflammatory markers today, have his PICC pulled and change his antibiotic therapy to suppressive doxycycline.  He will follow-up here in 6 weeks.      Relevant Medications   doxycycline (VIBRA-TABS) 100 MG tablet   Other Relevant Orders   C-reactive protein   Sedimentation rate   MRSA bacteremia    I am quite hopeful that his MRSA bacteremia and endocarditis have been completely cured.  No significant abnormalities or vegetations were noted on TEE when he was hospitalized recently.      Gangrene of toe of right foot [facility])    It appears that his recent revascularization has helped greatly.  His right foot pain has resolved.  I expect his dry gangrene to slowly evolve until the tip of his right second toe demarcates and comes off.         His review of     Michel Bickers, MD [facility] [phone] pager   [phone] cell 06/17/2019, 10:37 AM

## 2019-06-17 NOTE — Assessment & Plan Note (Signed)
I am quite hopeful that his MRSA bacteremia and endocarditis have been completely cured.  No significant abnormalities or vegetations were noted on TEE when he was hospitalized recently.

## 2019-06-17 NOTE — Assessment & Plan Note (Signed)
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  We will draw inflammatory markers today, have his PICC pulled and change his antibiotic therapy to suppressive doxycycline.  He will follow-up here in 6 weeks.

## 2019-06-17 NOTE — Progress Notes (Signed)
Per Dr Campbell 38 cm Single lumen Peripherally Inserted Central Catheter  removed from right basilic . No sutures present. Dressing was clean and dry . Area cleansed with chlorhexidine and petroleum dressing applied. Pt advised no heavy lifting with this arm, leave dressing for 24 hours and call the office if dressing becomes soaked with blood or sharp pain presents.  Pt tolerated procedure well.    Travia Onstad K Jull Harral, RN   

## 2019-06-17 NOTE — Assessment & Plan Note (Signed)
It appears that his recent revascularization has helped greatly.  His right foot pain has resolved.  I expect his dry gangrene to slowly evolve until the tip of his right second toe demarcates and comes off.

## 2019-06-18 LAB — C-REACTIVE PROTEIN: CRP: 15 mg/L — ABNORMAL HIGH (ref <8.0)

## 2019-06-18 LAB — SEDIMENTATION RATE: Sed Rate: 36 mm/h — ABNORMAL HIGH (ref 0–15)

## 2019-06-21 ENCOUNTER — Ambulatory Visit (INDEPENDENT_AMBULATORY_CARE_PROVIDER_SITE_OTHER): Payer: No Typology Code available for payment source | Admitting: Medical

## 2019-06-21 ENCOUNTER — Other Ambulatory Visit: Payer: Self-pay

## 2019-06-21 VITALS — BP 150/94 | HR 80 | Temp 96.4°F | Resp 18 | Ht 68.0 in | Wt 198.0 lb

## 2019-06-21 DIAGNOSIS — I739 Peripheral vascular disease, unspecified: Secondary | ICD-10-CM

## 2019-06-21 DIAGNOSIS — R7881 Bacteremia: Secondary | ICD-10-CM

## 2019-06-21 DIAGNOSIS — I96 Gangrene, not elsewhere classified: Secondary | ICD-10-CM

## 2019-06-21 DIAGNOSIS — E119 Type 2 diabetes mellitus without complications: Secondary | ICD-10-CM

## 2019-06-21 DIAGNOSIS — B9562 Methicillin resistant Staphylococcus aureus infection as the cause of diseases classified elsewhere: Secondary | ICD-10-CM

## 2019-06-21 DIAGNOSIS — M869 Osteomyelitis, unspecified: Secondary | ICD-10-CM

## 2019-06-21 DIAGNOSIS — M546 Pain in thoracic spine: Secondary | ICD-10-CM

## 2019-06-21 LAB — COMPREHENSIVE METABOLIC PANEL
ALT: 13 U/L (ref 0–53)
AST: 12 U/L (ref 0–37)
Albumin: 3.9 g/dL (ref 3.5–5.2)
Alkaline Phosphatase: 90 U/L (ref 39–117)
BUN: 17 mg/dL (ref 6–23)
CO2: 27 mEq/L (ref 19–32)
Calcium: 9.9 mg/dL (ref 8.4–10.5)
Chloride: 102 mEq/L (ref 96–112)
Creatinine, Ser: 0.88 mg/dL (ref 0.40–1.50)
GFR: 94.68 mL/min (ref >60.00)
Glucose, Bld: 129 mg/dL — ABNORMAL HIGH (ref 70–99)
Potassium: 4.4 mEq/L (ref 3.5–5.1)
Sodium: 136 mEq/L (ref 135–145)
Total Bilirubin: 0.4 mg/dL (ref 0.2–1.2)
Total Protein: 7.9 g/dL (ref 6.0–8.3)

## 2019-06-21 LAB — CBC WITH DIFFERENTIAL/PLATELET
Basophils Absolute: 0.1 10*3/uL (ref 0.0–0.1)
Basophils Relative: 0.6 % (ref 0.0–3.0)
Eosinophils Absolute: 0.2 10*3/uL (ref 0.0–0.7)
Eosinophils Relative: 1.7 % (ref 0.0–5.0)
HCT: 40.3 % (ref 39.0–52.0)
Hemoglobin: 13.3 g/dL (ref 13.0–17.0)
Lymphocytes Relative: 24.5 % (ref 12.0–46.0)
Lymphs Abs: 2.3 10*3/uL (ref 0.7–4.0)
MCHC: 33 g/dL (ref 30.0–36.0)
MCV: 79.1 fl (ref 78.0–100.0)
Monocytes Absolute: 0.5 10*3/uL (ref 0.1–1.0)
Monocytes Relative: 5.8 % (ref 3.0–12.0)
Neutro Abs: 6.3 10*3/uL (ref 1.4–7.7)
Neutrophils Relative %: 67.4 % (ref 43.0–77.0)
Platelets: 427 10*3/uL — ABNORMAL HIGH (ref 150.0–400.0)
RBC: 5.09 Mil/uL (ref 4.22–5.81)
RDW: 18.4 % — ABNORMAL HIGH (ref 11.5–15.5)
WBC: 9.3 10*3/uL (ref 4.0–10.5)

## 2019-06-21 LAB — SEDIMENTATION RATE: Sed Rate: 62 mm/hr — ABNORMAL HIGH (ref 0–15)

## 2019-06-21 LAB — C-REACTIVE PROTEIN: CRP: <1 mg/dL (ref 0.5–20.0)

## 2019-06-21 NOTE — Progress Notes (Signed)
Subjective:    Patient ID: Thomas [name], male    DOB: [birth year], 44 y.o.   MRN: [id number]  HPI  Pt in for follow up on lower extremity gangrene and osteomyelitis.   Indication for Admission: hematoma R leg s/p bypass surgery  Hospital Course(admit per vascular note): [name] is a 44 year old male who underwent right iliac artery stenting and right above-the-knee popliteal artery to TP trunk bypass with vein by Dr. Carlis Abbott on January 7 of this year.  He presented to the emergency department on 05/09/2019 with swelling of right leg and some bleeding from prior drain site.  He was admitted overnight for hematoma of right leg.  CTA during hospitalization demonstrated a patent iliac system as well as bypass.  Patient was also previously diagnosed with DVT of right lower extremity.  Repeat venous duplex no longer showed DVT.  Patient will continue aspirin and Plavix daily.  He can discontinue his Eliquis.  He has been encouraged to elevate his legs much as possible during the day and reassured that drainage of old hematoma blood is expected.  He will follow-up in office with Dr. Carlis Abbott in about 3 weeks.  He will be discharged home this morning in stable condition.  Per pt he [facility]. Then transferred to [facility] for 19 days.   Since DC has seen ID MD on 06-17-2018.  Pt has seen ID MD for osteomyelitis. Below is form last visit note with ID MD.   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  We will draw inflammatory markers today, have his PICC pulled and change his antibiotic therapy to suppressive doxycycline.  He will follow-up here in 6 weeks.      Relevant Medications   doxycycline (VIBRA-TABS) 100 MG tablet   Other Relevant Orders   C-reactive protein   Sedimentation rate   MRSA bacteremia    I am quite  hopeful that his MRSA bacteremia and endocarditis have been completely cured.  No significant abnormalities or vegetations were noted on TEE when he was hospitalized recently.      Gangrene of toe of right foot [facility])    It appears that his recent revascularization has helped greatly.  His right foot pain has resolved.  I expect his dry gangrene to slowly evolve until the tip of his right second toe demarcates and comes off.      On 06-08-2019 he saw vascular specialist.   Assessment/Plan:   44 y.o. male s/p right leg revascularization.  Ultimately underwent right external iliac angioplasty and stent on 04/19/2019.  Subsequently got a right above-knee pop to TP trunk bypass with reversed great saphenous vein on 04/22/2019.   Very pleased with his progress.  He has excellent posterior tibial signal on exam consistent with a patent bypass.  His toes are healing.  I will have him follow-up in 3 months with ABIs and right leg arterial duplex.  I discussed with him through the interpreter that if his toes start showing any signs of worsening he needs let our office know.  I am optimistic that most of these will heal with time.  On review today of his foot and comparing it looks virtually the same.  Follow up vascular appointment with vascular in May.  Pt is diabetic. He is not eating sugar and avoid salt  in diet. He is on lantus 31 units at night.   Recent c reactive protein and sed rate lower than before.   Review of Systems  Constitutional: Negative for chills, fatigue and fever.  HENT: Negative for congestion, ear discharge and ear pain.   Respiratory: Negative for cough, chest tightness, shortness of breath and wheezing.   Cardiovascular: Negative for chest pain and palpitations.  Gastrointestinal: Negative for abdominal pain, constipation and diarrhea.  Musculoskeletal: Positive for back pain.  Skin: Negative for color change and rash.  Neurological: Negative for dizziness, syncope,  numbness and headaches.  Hematological: Negative for adenopathy. Does not bruise/bleed easily.  Psychiatric/Behavioral: Negative for agitation. The patient is not nervous/anxious.     Past Medical History:  Diagnosis Date  . Depression 01/27/2015  . Diabetes mellitus without complication ([facility])   . Hyperlipidemia   . Hypertension      Social History   Socioeconomic History  . Marital status: Single    Spouse name: Not on file  . Number of children: Not on file  . Years of education: Not on file  . Highest education level: Not on file  Occupational History  . Not on file  Tobacco Use  . Smoking status: Former Games developer  . Smokeless tobacco: Never Used  Substance and Sexual Activity  . Alcohol use: No    Alcohol/week: 0.0 standard drinks  . Drug use: No  . Sexual activity: Not on file  Other Topics Concern  . Not on file  Social History Narrative  . Not on file   Social Determinants of Health   Financial Resource Strain:   . Difficulty of Paying Living Expenses: Not on file  Food Insecurity:   . Worried About Programme researcher, broadcasting/film/video in the Last Year: Not on file  . Ran Out of Food in the Last Year: Not on file  Transportation Needs:   . Lack of Transportation (Medical): Not on file  . Lack of Transportation (Non-Medical): Not on file  Physical Activity:   . Days of Exercise per Week: Not on file  . Minutes of Exercise per Session: Not on file  Stress:   . Feeling of Stress : Not on file  Social Connections:   . Frequency of Communication with Friends and Family: Not on file  . Frequency of Social Gatherings with Friends and Family: Not on file  . Attends Religious Services: Not on file  . Active Member of Clubs or Organizations: Not on file  . Attends Banker Meetings: Not on file  . Marital Status: Not on file  Intimate Partner Violence:   . Fear of Current or Ex-Partner: Not on file  . Emotionally Abused: Not on file  . Physically Abused: Not on file    . Sexually Abused: Not on file    Past Surgical History:  Procedure Laterality Date  . ABDOMINAL AORTOGRAM W/LOWER EXTREMITY Right 04/19/2019   Procedure: ABDOMINAL AORTOGRAM W/LOWER EXTREMITY;  Surgeon: [name], MD;  Location: [facility] INVASIVE CV LAB;  Service: Cardiovascular;  Laterality: Right;  . FEMORAL-TIBIAL BYPASS GRAFT Right 04/22/2019   Procedure: Right  FEMORAL-TIBIAL ARTERY BYPASS GRAFT with Harvest of Saphenous vein RIGHT SUPERFICIAL FEMORAL ARTERY TO TIBIOPERONEAL TRUNK BYPASS;  Surgeon: [name], MD;  Location: [facility] OR;  Service: Vascular;  Laterality: Right;  . IR CERVICAL/THORACIC DISC ASPIRATION W/IMAG GUIDE  04/15/2019  . IR FLUORO GUIDED NEEDLE PLC ASPIRATION/INJECTION LOC  04/15/2019  . kidney stones  2009  . PERIPHERAL VASCULAR  INTERVENTION  04/19/2019   Procedure: PERIPHERAL VASCULAR INTERVENTION;  Surgeon: [name], MD;  Location: [facility] INVASIVE CV LAB;  Service: Cardiovascular;;  right external iliac  . TEE WITHOUT CARDIOVERSION N/A 04/19/2019   Procedure: TRANSESOPHAGEAL ECHOCARDIOGRAM (TEE);  Surgeon: [name], MD;  Location: [facility] ENDOSCOPY;  Service: Endoscopy;  Laterality: N/A;    No family history on file.  No Known Allergies  Current Outpatient Medications on File Prior to Visit  Medication Sig Dispense Refill  . aspirin EC 81 MG tablet Take 1 tablet (81 mg total) by mouth daily. (Patient not taking: Reported on 06/08/2019) 150 tablet 2  . atorvastatin (LIPITOR) 10 MG tablet Take 1 tablet (10 mg total) by mouth daily at 6 PM. 30 tablet 2  . clopidogrel (PLAVIX) 75 MG tablet Take 1 tablet (75 mg total) by mouth daily with breakfast. 30 tablet 2  . doxycycline (VIBRA-TABS) 100 MG tablet Take 1 tablet (100 mg total) by mouth 2 (two) times daily. 60 tablet 3  . gabapentin (NEURONTIN) 100 MG capsule TAKE ONE CAPSULE BY MOUTH AT BEDTIME (Patient not taking: Reported on 06/21/2019) 30 capsule 0  . Heating Pads PADS Use over the  counter heating pads as needed for back pain 1 Units 5  . hydrochlorothiazide (HYDRODIURIL) 25 MG tablet TAKE ONE TABLET BY MOUTH DAILY (Patient not taking: No sig reported) 30 tablet 0  . Insulin Glargine (LANTUS SOLOSTAR) 100 UNIT/ML Solostar Pen Inject 31 Units into the skin at bedtime. 15 mL 2  . Lancets MISC Use to check sugar once a day.  Dx E11.9 100 each 1  . lidocaine (LIDODERM) 5 % Place 1 patch onto the skin daily. Remove & Discard patch within 12 hours or as directed by MD (Patient not taking: Reported on 06/21/2019) 30 patch 0  . lisinopril (ZESTRIL) 20 MG tablet TAKE ONE TABLET BY MOUTH DAILY 30 tablet 0  . metFORMIN (GLUCOPHAGE-XR) 500 MG 24 hr tablet TAKE TWO TABLETS BY MOUTH TWICE A DAY (Patient not taking: No sig reported) 120 tablet 0  . traMADol (ULTRAM) 50 MG tablet Take 1 tablet (50 mg total) by mouth 2 (two) times daily as needed for moderate pain. 60 tablet 0   No current facility-administered medications on file prior to visit.    BP (!) 150/94 (BP Location: Left Arm, Patient Position: Sitting, Cuff Size: Large)   Pulse 80   Temp (!) 96.4 F (35.8 C) (Temporal)   Resp 18   Ht 5\' 8"  (1.727 m)   Wt 198 lb (89.8 kg)   SpO2 98%   BMI 30.11 kg/m       Objective:   Physical Exam   General Mental Status- Alert. General Appearance- Not in acute distress.   Skin General: Color- Normal Color. Moisture- Normal Moisture.  Neck Carotid Arteries- Normal color. Moisture- Normal Moisture. No carotid bruits. No JVD.  Chest and Lung Exam Auscultation: Breath Sounds:-Normal.  Cardiovascular Auscultation:Rythm- Regular. Murmurs & Other Heart Sounds:Auscultation of the heart reveals- No Murmurs.  Abdomen Inspection:-Inspeection Normal. Palpation/Percussion:Note:No mass. Palpation and Percussion of the abdomen reveal- Non Tender, Non Distended + BS, no rebound or guarding.   Neurologic Cranial Nerve exam:- CN III-XII intact(No nystagmus), symmetric  smile. Strength:- 5/5 equal and symmetric strength both upper and lower extremities.     Assessment & Plan:  Good to see that you are doing well after long hospitalization.  On review you recover from MRSA bacteremia.  You had complication of osteomyelitis of thoracic spine and  finished IV treatment.  Now on doxycycline suppressive therapy.  You report that you are going to follow-up with surgeon for potential thoracic spine procedure in about 6 weeks.  Today we will go ahead and get a C-reactive protein, sed rate and a CBC.  The gangrene of your right foot appears much improved after re-vascular procedure.  Specialist note reviewed and picture procedure.  Specialist note reviewed and picture of your foot in epic looks the exact same today upon inspection.  No signs/symptoms of worsening gangrene.  If you have any worsening or changing signs/symptoms on your foot please let me know.  Could get you back in with your vascular surgeon quicker than planned if necessary.  For diabetes, counseled to check your sugars daily at least in the morning fasting.  Titrate your Lantus insulin as instructed.  Follow-up in 2 weeks or as needed.  Note patient came in with his friend who speaks good [place] and interprets for patient.  [name], PA-C   45 minutes spent with pt today

## 2019-06-21 NOTE — Patient Instructions (Addendum)
Good to see that you are doing well after long hospitalization.  On review you recover from MRSA bacteremia.  You had complication of osteomyelitis of thoracic spine and finished IV treatment.  Now on doxycycline suppressive therapy.  You report that you are going to follow-up with surgeon for potential thoracic spine procedure in about 6 weeks.  Today we will go ahead and get a C-reactive protein, sed rate and a CBC.  The gangrene of your right foot appears much improved after re-vascularization procedure.  Specialist note reviewed and picture procedure.  Specialist note reviewed and picture of your foot in epic looks the exact same today upon inspection.  No signs/symptoms of worsening gangrene.  If you have any worsening or changing signs/symptoms on your foot please let me know.  Could get you back in with your vascular surgeon quicker than planned if necessary.  For diabetes, counseled to check your sugars daily at least in the morning fasting.  Titrate your Lantus insulin as instructed.  Follow-up in 2 weeks or as needed.  Note patient came in with his friend who speaks good [place] and interprets for patient.

## 2019-07-05 ENCOUNTER — Other Ambulatory Visit: Payer: Self-pay

## 2019-07-05 ENCOUNTER — Ambulatory Visit (INDEPENDENT_AMBULATORY_CARE_PROVIDER_SITE_OTHER): Payer: Self-pay | Admitting: Medical

## 2019-07-05 VITALS — BP 152/94 | HR 110 | Temp 95.1°F | Resp 18 | Ht 68.0 in | Wt 200.6 lb

## 2019-07-05 DIAGNOSIS — E119 Type 2 diabetes mellitus without complications: Secondary | ICD-10-CM

## 2019-07-05 DIAGNOSIS — Z79899 Other long term (current) drug therapy: Secondary | ICD-10-CM

## 2019-07-05 DIAGNOSIS — M546 Pain in thoracic spine: Secondary | ICD-10-CM

## 2019-07-05 DIAGNOSIS — M869 Osteomyelitis, unspecified: Secondary | ICD-10-CM

## 2019-07-05 DIAGNOSIS — I739 Peripheral vascular disease, unspecified: Secondary | ICD-10-CM

## 2019-07-05 LAB — CBC WITH DIFFERENTIAL/PLATELET
Basophils Absolute: 0 10*3/uL (ref 0.0–0.1)
Basophils Relative: 0.5 % (ref 0.0–3.0)
Eosinophils Absolute: 0.1 10*3/uL (ref 0.0–0.7)
Eosinophils Relative: 1.3 % (ref 0.0–5.0)
HCT: 42.7 % (ref 39.0–52.0)
Hemoglobin: 13.8 g/dL (ref 13.0–17.0)
Lymphocytes Relative: 28.9 % (ref 12.0–46.0)
Lymphs Abs: 2.5 10*3/uL (ref 0.7–4.0)
MCHC: 32.3 g/dL (ref 30.0–36.0)
MCV: 80.6 fl (ref 78.0–100.0)
Monocytes Absolute: 0.5 10*3/uL (ref 0.1–1.0)
Monocytes Relative: 5.7 % (ref 3.0–12.0)
Neutro Abs: 5.4 10*3/uL (ref 1.4–7.7)
Neutrophils Relative %: 63.6 % (ref 43.0–77.0)
Platelets: 323 10*3/uL (ref 150.0–400.0)
RBC: 5.3 Mil/uL (ref 4.22–5.81)
RDW: 18.4 % — ABNORMAL HIGH (ref 11.5–15.5)
WBC: 8.5 10*3/uL (ref 4.0–10.5)

## 2019-07-05 LAB — COMPREHENSIVE METABOLIC PANEL
ALT: 11 U/L (ref 0–53)
AST: 11 U/L (ref 0–37)
Albumin: 4 g/dL (ref 3.5–5.2)
Alkaline Phosphatase: 93 U/L (ref 39–117)
BUN: 24 mg/dL — ABNORMAL HIGH (ref 6–23)
CO2: 27 mEq/L (ref 19–32)
Calcium: 9.6 mg/dL (ref 8.4–10.5)
Chloride: 103 mEq/L (ref 96–112)
Creatinine, Ser: 0.87 mg/dL (ref 0.40–1.50)
GFR: 95.92 mL/min (ref >60.00)
Glucose, Bld: 159 mg/dL — ABNORMAL HIGH (ref 70–99)
Potassium: 4.4 mEq/L (ref 3.5–5.1)
Sodium: 138 mEq/L (ref 135–145)
Total Bilirubin: 0.3 mg/dL (ref 0.2–1.2)
Total Protein: 7.6 g/dL (ref 6.0–8.3)

## 2019-07-05 LAB — SEDIMENTATION RATE: Sed Rate: 30 mm/hr — ABNORMAL HIGH (ref 0–15)

## 2019-07-05 LAB — C-REACTIVE PROTEIN: CRP: <1 mg/dL (ref 0.5–20.0)

## 2019-07-05 MED ORDER — TRAMADOL HCL 50 MG PO TABS: ORAL_TABLET | ORAL | 0 refills | Status: DC

## 2019-07-05 MED ORDER — LANTUS SOLOSTAR 100 UNIT/ML ~~LOC~~ SOPN: 31.0000 [IU] | PEN_INJECTOR | Freq: Every day | SUBCUTANEOUS | 2 refills | Status: DC

## 2019-07-05 NOTE — Patient Instructions (Signed)
For your history of severe back pain and  osteomyelitis of t-spine, we are refilling your tramadol and not your oxycodone. Will get cbc, cmp, sed rate and crp today. Continue doxycycline antibiotic. May need to coordinate with ID MD if labs worse. May need to refer to spine specialist as well.  For diabetes, I refilled your lantus. Asking MA to call health dept to call in lantus as your requessted. But also gave you script as I don't think calling directly necessary.  Your toe/foot looks like healing well except 3rd toe healing slower than other areas. Keep follow up with vascular surgeon.  Follow up with me 1 month or as needed

## 2019-07-05 NOTE — Progress Notes (Signed)
Subjective:    Patient ID: Thomas [name], male    DOB: [birth year], 44 y.o.   MRN: [id number]  HPI  Pt in for follow up.  He is still having severe back pain. He has history of osteomyelitis of the spine. Pt in past was switched to doxycycline. He has history of MRSA bacterimia and endocarditis. Next appointment with infectious disease MD in May.   Pt never had surgery for his back.  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 Similar extent of ventral and dorsal epidural inflammatory change with stable to slightly decreased moderate to severe spinal stenosis at T8-T9.     While pt was hospitalized he was placed on oxycodone 5/325 and then he was switched to tramadol.   He states pain level can be controled with tramadol. But on movement at time severe. He is no longer on oxycodone.  Pt last labs showed normal crp and sed rate was elevated. Sed rate not as elevated as had been 2 months ago.   Pt had 3rd to ulcer and had vascular surgery in hospital. Will follow up with vascular surgeon in May.       Review of Systems  Constitutional: Negative for chills and fatigue.  Respiratory: Negative for cough, choking, shortness of breath and wheezing.   Cardiovascular: Negative for chest pain and palpitations.  Gastrointestinal: Negative for abdominal pain.  Musculoskeletal: Positive for back pain.  Skin: Negative for rash.  Neurological: Negative for dizziness, speech difficulty and weakness.  Hematological: Negative for adenopathy. Does not bruise/bleed easily.  Psychiatric/Behavioral: Negative for behavioral problems and confusion.   Past Medical History:  Diagnosis Date  . Depression 01/27/2015  . Diabetes mellitus without complication (HCC)   . Hyperlipidemia   . Hypertension      Social History   Socioeconomic History  . Marital status: Single    Spouse name: Not on file  . Number of children:  Not on file  . Years of education: Not on file  . Highest education level: Not on file  Occupational History  . Not on file  Tobacco Use  . Smoking status: Former Games developer  . Smokeless tobacco: Never Used  Substance and Sexual Activity  . Alcohol use: No    Alcohol/week: 0.0 standard drinks  . Drug use: No  . Sexual activity: Not on file  Other Topics Concern  . Not on file  Social History Narrative  . Not on file   Social Determinants of Health   Financial Resource Strain:   . Difficulty of Paying Living Expenses:   Food Insecurity:   . Worried About Programme researcher, broadcasting/film/video in the Last Year:   . Barista in the Last Year:   Transportation Needs:   . Freight forwarder (Medical):   Marland Kitchen Lack of Transportation (Non-Medical):   Physical Activity:   . Days of Exercise per Week:   . Minutes of Exercise per Session:   Stress:   . Feeling of Stress :   Social Connections:   . Frequency of Communication with Friends and Family:   . Frequency of Social Gatherings with Friends and Family:   . Attends Religious Services:   . Active Member of Clubs or Organizations:   . Attends Banker Meetings:   Marland Kitchen Marital Status:   Intimate Partner Violence:   . Fear of Current or Ex-Partner:   . Emotionally Abused:   Marland Kitchen Physically Abused:   . Sexually Abused:  Past Surgical History:  Procedure Laterality Date  . ABDOMINAL AORTOGRAM W/LOWER EXTREMITY Right 04/19/2019   Procedure: ABDOMINAL AORTOGRAM W/LOWER EXTREMITY;  Surgeon: [name], MD;  Location: [facility] INVASIVE CV LAB;  Service: Cardiovascular;  Laterality: Right;  . FEMORAL-TIBIAL BYPASS GRAFT Right 04/22/2019   Procedure: Right  FEMORAL-TIBIAL ARTERY BYPASS GRAFT with Harvest of Saphenous vein RIGHT SUPERFICIAL FEMORAL ARTERY TO TIBIOPERONEAL TRUNK BYPASS;  Surgeon: [name], MD;  Location: [facility] OR;  Service: Vascular;  Laterality: Right;  . IR CERVICAL/THORACIC DISC ASPIRATION W/IMAG GUIDE   04/15/2019  . IR FLUORO GUIDED NEEDLE PLC ASPIRATION/INJECTION LOC  04/15/2019  . kidney stones  2009  . PERIPHERAL VASCULAR INTERVENTION  04/19/2019   Procedure: PERIPHERAL VASCULAR INTERVENTION;  Surgeon: [name], MD;  Location: [facility] INVASIVE CV LAB;  Service: Cardiovascular;;  right external iliac  . TEE WITHOUT CARDIOVERSION N/A 04/19/2019   Procedure: TRANSESOPHAGEAL ECHOCARDIOGRAM (TEE);  Surgeon: [name], MD;  Location: [facility] ENDOSCOPY;  Service: Endoscopy;  Laterality: N/A;    No family history on file.  No Known Allergies  Current Outpatient Medications on File Prior to Visit  Medication Sig Dispense Refill  . aspirin EC 81 MG tablet Take 1 tablet (81 mg total) by mouth daily. (Patient not taking: Reported on 06/08/2019) 150 tablet 2  . atorvastatin (LIPITOR) 10 MG tablet Take 1 tablet (10 mg total) by mouth daily at 6 PM. 30 tablet 2  . clopidogrel (PLAVIX) 75 MG tablet Take 1 tablet (75 mg total) by mouth daily with breakfast. 30 tablet 2  . doxycycline (VIBRA-TABS) 100 MG tablet Take 1 tablet (100 mg total) by mouth 2 (two) times daily. 60 tablet 3  . gabapentin (NEURONTIN) 100 MG capsule TAKE ONE CAPSULE BY MOUTH AT BEDTIME (Patient not taking: Reported on 06/21/2019) 30 capsule 0  . Heating Pads PADS Use over the counter heating pads as needed for back pain 1 Units 5  . hydrochlorothiazide (HYDRODIURIL) 25 MG tablet TAKE ONE TABLET BY MOUTH DAILY (Patient not taking: No sig reported) 30 tablet 0  . Lancets MISC Use to check sugar once a day.  Dx E11.9 100 each 1  . lidocaine (LIDODERM) 5 % Place 1 patch onto the skin daily. Remove & Discard patch within 12 hours or as directed by MD (Patient not taking: Reported on 06/21/2019) 30 patch 0  . lisinopril (ZESTRIL) 20 MG tablet TAKE ONE TABLET BY MOUTH DAILY 30 tablet 0  . metFORMIN (GLUCOPHAGE-XR) 500 MG 24 hr tablet TAKE TWO TABLETS BY MOUTH TWICE A DAY (Patient not taking: No sig reported) 120 tablet 0  .  traMADol (ULTRAM) 50 MG tablet Take 1 tablet (50 mg total) by mouth 2 (two) times daily as needed for moderate pain. 60 tablet 0   No current facility-administered medications on file prior to visit.    BP (!) 152/94 (BP Location: Left Arm, Patient Position: Sitting, Cuff Size: Large)   Pulse (!) 110   Temp (!) 95.1 F (35.1 C) (Temporal)   Resp 18   Ht 5\' 8"  (1.727 m)   Wt 200 lb 9.6 oz (91 kg)   SpO2 100%   BMI 30.50 kg/m       Objective:   Physical Exam  General Mental Status- Alert. General Appearance- Not in acute distress.   Skin General: Color- Normal Color. Moisture- Normal Moisture.  Neck Carotid Arteries- Normal color. Moisture- Normal Moisture. No carotid bruits. No JVD.  Chest and Lung Exam Auscultation: Breath Sounds:-Normal.  Cardiovascular Auscultation:Rythm- Regular. Murmurs & Other Heart Sounds:Auscultation of the heart reveals- No Murmurs.  Abdomen Inspection:-Inspeection Normal. Palpation/Percussion:Note:No mass. Palpation and Percussion of the abdomen reveal- Non Tender, Non Distended + BS, no rebound or guarding.   Neurologic Cranial Nerve exam:- CN III-XII intact(No nystagmus), symmetric smile. Strength:- 5/5 equal and symmetric strength both upper and lower extremities.      Assessment & Plan:   For your history of severe back pain and  osteomyelitis of t-spine, we are refilling your tramadol and not your oxycodone. Will get cbc, cmp, sed rate and crp today. Continue doxycycline antibiotic. May need to coordinate with ID MD if labs worse. May need to refer to spine specialist as well.  For diabetes, I refilled your lantus. Asking MA to call health dept to call in lantus as your requessted. But also gave you script as I don't think calling directly necessary.  Your toe/foot looks like healing well except 3rd toe healing slower than other areas. Keep follow up with vascular surgeon.  Follow up with me 1 month or as needed  40 minutes  spent with pt today. Time spent today discussing plan going forward for osteomyelitis(possible referral to spine specialist),  explained chronic pain treatment plan, contract details, diabetic plan and  reviewed vascular plan for previously necrotic toe.  Mackie Pai, PA-C

## 2019-07-07 LAB — DRUG TOX MONITOR 1 W/CONF, ORAL FLD
Amphetamines: NEGATIVE ng/mL (ref <10)
Barbiturates: NEGATIVE ng/mL (ref <10)
Benzodiazepines: NEGATIVE ng/mL (ref <0.50)
Buprenorphine: NEGATIVE ng/mL (ref <0.10)
Cocaine: NEGATIVE ng/mL (ref <5.0)
Cotinine: 82 ng/mL — ABNORMAL HIGH (ref <5.0)
Fentanyl: NEGATIVE ng/mL (ref <0.10)
Heroin Metabolite: NEGATIVE ng/mL (ref <1.0)
MARIJUANA: NEGATIVE ng/mL (ref <2.5)
MDMA: NEGATIVE ng/mL (ref <10)
Meprobamate: NEGATIVE ng/mL (ref <2.5)
Methadone: NEGATIVE ng/mL (ref <5.0)
Nicotine Metabolite: POSITIVE ng/mL — AB (ref <5.0)
Opiates: NEGATIVE ng/mL (ref <2.5)
Phencyclidine: NEGATIVE ng/mL (ref <10)
Tapentadol: NEGATIVE ng/mL (ref <5.0)
Tramadol: NEGATIVE ng/mL (ref <5.0)
Zolpidem: NEGATIVE ng/mL (ref <5.0)

## 2019-07-22 ENCOUNTER — Other Ambulatory Visit: Payer: Self-pay

## 2019-07-23 ENCOUNTER — Other Ambulatory Visit (INDEPENDENT_AMBULATORY_CARE_PROVIDER_SITE_OTHER): Payer: Self-pay

## 2019-07-23 ENCOUNTER — Telehealth: Payer: Self-pay | Admitting: Medical

## 2019-07-23 ENCOUNTER — Other Ambulatory Visit: Payer: Self-pay

## 2019-07-23 DIAGNOSIS — E119 Type 2 diabetes mellitus without complications: Secondary | ICD-10-CM

## 2019-07-23 LAB — HEMOGLOBIN A1C: Hgb A1c MFr Bld: 6.9 % — ABNORMAL HIGH (ref 4.6–6.5)

## 2019-07-23 NOTE — Telephone Encounter (Signed)
Pt came in today 07-23-2019 to get lab work done, pt stated would like to get results mailed to him and also called to let him know his results. Please advise.

## 2019-08-03 ENCOUNTER — Encounter: Payer: Self-pay | Admitting: Internal Medicine

## 2019-08-03 ENCOUNTER — Telehealth: Payer: Self-pay | Admitting: Medical

## 2019-08-03 ENCOUNTER — Ambulatory Visit (INDEPENDENT_AMBULATORY_CARE_PROVIDER_SITE_OTHER): Payer: Self-pay | Admitting: Internal Medicine

## 2019-08-03 ENCOUNTER — Other Ambulatory Visit: Payer: Self-pay

## 2019-08-03 DIAGNOSIS — M4620 Osteomyelitis of vertebra, site unspecified: Secondary | ICD-10-CM

## 2019-08-03 MED ORDER — LANTUS SOLOSTAR 100 UNIT/ML ~~LOC~~ SOPN: 31.0000 [IU] | PEN_INJECTOR | Freq: Every day | SUBCUTANEOUS | 2 refills | Status: DC

## 2019-08-03 NOTE — Progress Notes (Signed)
[facility] for Infectious Disease  Patient Active Problem List   Diagnosis Date Noted  . Discitis of thoracolumbar region     Priority: High  . MRSA bacteremia     Priority: High  . Endocarditis of mitral valve     Priority: High  . Gangrene of toe of right foot ([place]) 04/14/2019    Priority: High  . Osteomyelitis of spine ([place]) 04/13/2019    Priority: High  . PAD (peripheral artery disease) ([place]) 06/08/2019  . Hematoma 05/09/2019  . Rectal mass 04/14/2019  . DVT of popliteal vein ([place]) 04/14/2019  . Diabetes mellitus type II, uncontrolled ([place]) 01/27/2015  . Diabetic neuropathy ([place]) 01/27/2015  . HTN (hypertension) 01/27/2015  . Depression 01/27/2015  . Hyperlipidemia 01/27/2015    Patient's Medications  New Prescriptions   No medications on file  Previous Medications   ASPIRIN EC 81 MG TABLET    Take 1 tablet (81 mg total) by mouth daily.   ATORVASTATIN (LIPITOR) 10 MG TABLET    Take 1 tablet (10 mg total) by mouth daily at 6 PM.   CLOPIDOGREL (PLAVIX) 75 MG TABLET    Take 1 tablet (75 mg total) by mouth daily with breakfast.   DOXYCYCLINE (VIBRA-TABS) 100 MG TABLET    Take 1 tablet (100 mg total) by mouth 2 (two) times daily.   GABAPENTIN (NEURONTIN) 100 MG CAPSULE    TAKE ONE CAPSULE BY MOUTH AT BEDTIME   HEATING PADS PADS    Use over the counter heating pads as needed for back pain   HYDROCHLOROTHIAZIDE (HYDRODIURIL) 25 MG TABLET    TAKE ONE TABLET BY MOUTH DAILY   INSULIN GLARGINE (LANTUS SOLOSTAR) 100 UNIT/ML SOLOSTAR PEN    Inject 31 Units into the skin at bedtime.   LANCETS MISC    Use to check sugar once a day.  Dx E11.9   LIDOCAINE (LIDODERM) 5 %    Place 1 patch onto the skin daily. Remove & Discard patch within 12 hours or as directed by MD   LISINOPRIL (ZESTRIL) 20 MG TABLET    TAKE ONE TABLET BY MOUTH DAILY   METFORMIN (GLUCOPHAGE-XR) 500 MG 24 HR TABLET    TAKE TWO TABLETS BY MOUTH TWICE A DAY   TRAMADOL (ULTRAM) 50 MG TABLET    1 tab po q 8  hours as needed prn pain  Modified Medications   No medications on file  Discontinued Medications   No medications on file    Subjective: Thomas [name] is in for his routine follow-up visit. He was hospitalized on 01/31/2019 with MRSA bacteremia complicated by lower thoracic spine infection and mitral valve endocarditis.  He had persistent bacteremia for 2 weeks and was switched from vancomycin to daptomycin and ceftaroline.  He was discharged on daptomycin alone, completing therapy on 04/04/2019.    He started to feel worse shortly after completing antibiotic therapy and noted some worrisome changes in the toes of his right foot leading to readmission here on 04/14/2019.  Both blood cultures from 04/13/2019 and 1 of 2 from 04/14/2019 have grown MRSA.  MRI showed progressive infection at the T9 level with new discitis at T8 and T10.  MRI of his right foot showed no definite osteomyelitis or abscess.  He underwent TEE which did not show any vegetations or other evidence of persistent endocarditis.    He required vascular surgery on his right leg during that admission.  I elected to restart daptomycin and  ceftaroline.  Since discharge she has been receiving daptomycin in the short stay unit at [facility] and ceftaroline at home. He completed 8 weeks of antibiotic therapy in this second course of antibiotics on 06/17/2019 before converting to oral doxycycline.  He has had no problems tolerating doxycycline.    He is still having severe back pain that gets worse when he stands up and starts walking.  Never he is getting by with just acetaminophen and ibuprofen.  He still feels that he is unable to work.  Review of Systems: Review of Systems  Constitutional: Negative for fever and weight loss.  HENT: Negative for congestion and sore throat.   Respiratory: Negative for cough and shortness of breath.   Cardiovascular: Negative for chest pain.  Gastrointestinal: Negative for abdominal pain, diarrhea,  nausea and vomiting.  Musculoskeletal: Positive for back pain.  Skin: Negative for rash.    Past Medical History:  Diagnosis Date  . Depression 01/27/2015  . Diabetes mellitus without complication (HCC)   . Hyperlipidemia   . Hypertension     Social History   Tobacco Use  . Smoking status: Former Games developer  . Smokeless tobacco: Never Used  Substance Use Topics  . Alcohol use: No    Alcohol/week: 0.0 standard drinks  . Drug use: No    No family history on file.  No Known Allergies  Objective: There were no vitals filed for this visit. There is no height or weight on file to calculate BMI.  Physical Exam Constitutional:      Comments: I interviewed him with the aid of the interpreter.  He is in very good spirits and seems much more comfortable and happy.  Cardiovascular:     Rate and Rhythm: Normal rate and regular rhythm.     Heart sounds: No murmur.  Pulmonary:     Effort: Pulmonary effort is normal.     Breath sounds: Normal breath sounds.  Abdominal:     Palpations: Abdomen is soft.     Tenderness: There is no abdominal tenderness.  Musculoskeletal:     Right lower leg: Edema present.     Comments: He has no bony deformity over the spine.  Skin:    Findings: No rash.  Neurological:     General: No focal deficit present.  Psychiatric:        Mood and Affect: Mood normal.     Lab Results He has not had any lab work drawn since discharge   Problem List Items Addressed This Visit      High   Osteomyelitis of spine (HCC)    Given the severity, duration and relapsing nature of his MRSA infection I will continue doxycycline for now.  I will repeat his inflammatory markers today and see him back in 6 weeks.         His review of     Cliffton Asters, MD [facility]ious [facility] [phone] pager   [phone] cell 08/03/2019, 3:52 PM

## 2019-08-03 NOTE — Telephone Encounter (Signed)
Medication: insulin glargine (LANTUS SOLOSTAR) 100 UNIT/ML Solostar Pen   Has the patient contacted their pharmacy? No. (If no, request that the patient contact the pharmacy for the refill.) (If yes, when and what did the pharmacy advise?)  Preferred Pharmacy (with phone number or street name):  [facility] - [place], [place] - [address] Phone:  [phone]  Fax:  [phone]      Agent: Please be advised that RX refills may take up to 3 business days. We ask that you follow-up with your pharmacy.

## 2019-08-03 NOTE — Assessment & Plan Note (Signed)
Given the severity, duration and relapsing nature of his MRSA infection I will continue doxycycline for now.  I will repeat his inflammatory markers today and see him back in 6 weeks.

## 2019-08-03 NOTE — Telephone Encounter (Signed)
Medication sent.

## 2019-08-04 LAB — SEDIMENTATION RATE: Sed Rate: 17 mm/h — ABNORMAL HIGH (ref 0–15)

## 2019-08-04 LAB — C-REACTIVE PROTEIN: CRP: 3.9 mg/L (ref <8.0)

## 2019-08-04 NOTE — Progress Notes (Signed)
[facility]  Patient Active Problem List   Diagnosis Date Noted  . Discitis of thoracolumbar region     Priority: High  . MRSA bacteremia     Priority: High  . Endocarditis of mitral valve     Priority: High  . Gangrene of toe of right foot (HCC) 04/14/2019    Priority: High  . Osteomyelitis of spine (HCC) 04/13/2019    Priority: High  . PAD (peripheral artery disease) (HCC) 06/08/2019  . Hematoma 05/09/2019  . Rectal mass 04/14/2019  . DVT of popliteal vein (HCC) 04/14/2019  . Diabetes mellitus type II, uncontrolled (HCC) 01/27/2015  . Diabetic neuropathy (HCC) 01/27/2015  . HTN (hypertension) 01/27/2015  . Depression 01/27/2015  . Hyperlipidemia 01/27/2015    Patient's Medications  New Prescriptions   No medications on file  Previous Medications   ASPIRIN EC 81 MG TABLET    Take 1 tablet (81 mg total) by mouth daily.   ATORVASTATIN (LIPITOR) 10 MG TABLET    Take 1 tablet (10 mg total) by mouth daily at 6 PM.   CLOPIDOGREL (PLAVIX) 75 MG TABLET    Take 1 tablet (75 mg total) by mouth daily with breakfast.   DOXYCYCLINE (VIBRA-TABS) 100 MG TABLET    Take 1 tablet (100 mg total) by mouth 2 (two) times daily.   GABAPENTIN (NEURONTIN) 100 MG CAPSULE    TAKE ONE CAPSULE BY MOUTH AT BEDTIME   HEATING PADS PADS    Use over the counter heating pads as needed for back pain   HYDROCHLOROTHIAZIDE (HYDRODIURIL) 25 MG TABLET    TAKE ONE TABLET BY MOUTH DAILY   INSULIN GLARGINE (LANTUS SOLOSTAR) 100 UNIT/ML SOLOSTAR PEN    Inject 31 Units into the skin at bedtime.   LANCETS MISC    Use to check sugar once a day.  Dx E11.9   LIDOCAINE (LIDODERM) 5 %    Place 1 patch onto the skin daily. Remove & Discard patch within 12 hours or as directed by MD   LISINOPRIL (ZESTRIL) 20 MG TABLET    TAKE ONE TABLET BY MOUTH DAILY   METFORMIN (GLUCOPHAGE-XR) 500 MG 24 HR TABLET    TAKE TWO TABLETS BY MOUTH TWICE A DAY   TRAMADOL (ULTRAM) 50 MG TABLET    1 tab po q 8  hours as needed prn pain  Modified Medications   No medications on file  Discontinued Medications   No medications on file    Subjective: Oluwatobi is in for his routine follow-up visit. He was hospitalized on 01/31/2019 with MRSA bacteremia complicated by lower thoracic spine infection and mitral valve endocarditis.  He had persistent bacteremia for 2 weeks and was switched from vancomycin to daptomycin and ceftaroline.  He was discharged on daptomycin alone, completing therapy on 04/04/2019.    He started to feel worse shortly after completing antibiotic therapy and noted some worrisome changes in the toes of his right foot leading to readmission here on 04/14/2019.  Both blood cultures from 04/13/2019 and 1 of 2 from 04/14/2019 have grown MRSA.  MRI showed progressive infection at the T9 level with new discitis at T8 and T10.  MRI of his right foot showed no definite osteomyelitis or abscess.  He underwent TEE which did not show any vegetations or other evidence of persistent endocarditis.    He required vascular surgery on his right leg during that admission.  I elected to restart daptomycin and ceftaroline.  Since discharge she has been receiving daptomycin in the short stay unit at [facility] and ceftaroline at home. He completed 8 weeks of IV antibiotic therapy on 06/17/2019 before converting to oral doxycycline.  He has not had any problems tolerating his antibiotic. He is improving.  His pain is better.  He is getting by without tramadol or narcotic pain medication.    Review of Systems: Review of Systems  Constitutional: Negative for fever and weight loss.  HENT: Negative for congestion and sore throat.   Respiratory: Negative for cough and shortness of breath.   Cardiovascular: Negative for chest pain.  Gastrointestinal: Negative for abdominal pain, diarrhea, nausea and vomiting.  Musculoskeletal: Positive for back pain.  Skin: Negative for rash.    Past Medical History:  Diagnosis  Date  . Depression 01/27/2015  . Diabetes mellitus without complication ([place])   . Hyperlipidemia   . Hypertension     Social History   Tobacco Use  . Smoking status: Former Research scientist (life sciences)  . Smokeless tobacco: Never Used  Substance Use Topics  . Alcohol use: No    Alcohol/week: 0.0 standard drinks  . Drug use: No    History reviewed. No pertinent family history.  No Known Allergies  Objective: Vitals:   08/03/19 1523  BP: (!) 185/115  Pulse: (!) 115  Temp: 98.5 F (36.9 C)  SpO2: 98%  Weight: 207 lb (93.9 kg)   Body mass index is 31.47 kg/m.  Physical Exam Constitutional:      Comments: I interviewed him with the aid of the interpreter.    Cardiovascular:     Rate and Rhythm: Normal rate and regular rhythm.     Heart sounds: No murmur.  Pulmonary:     Effort: Pulmonary effort is normal.     Breath sounds: Normal breath sounds.  Abdominal:     Palpations: Abdomen is soft.     Tenderness: There is no abdominal tenderness.  Musculoskeletal:     Right lower leg: Edema present.     Comments: He has no kyphotic deformities of the spine.  Skin:    Findings: No rash.  Neurological:     General: No focal deficit present.  Psychiatric:        Mood and Affect: Mood normal.    Problem List Items Addressed This Visit      High   Osteomyelitis of spine ([place])    He is improving slowly on chronic suppressive doxycycline therapy for severe, relapsing MRSA bacteremia and spine infection.  He will continue doxycycline and follow-up in 6 weeks.      Relevant Orders   Sedimentation rate (Completed)   C-reactive protein (Completed)      His review of     Michel Bickers, MD [facility] for [place] [phone] pager   [phone] cell 08/04/2019, 9:40 AM

## 2019-08-04 NOTE — Assessment & Plan Note (Signed)
He is improving slowly on chronic suppressive doxycycline therapy for severe, relapsing MRSA bacteremia and spine infection.  He will continue doxycycline and follow-up in 6 weeks.

## 2019-08-09 ENCOUNTER — Ambulatory Visit: Payer: No Typology Code available for payment source | Admitting: Internal Medicine

## 2019-08-16 ENCOUNTER — Ambulatory Visit: Payer: No Typology Code available for payment source | Admitting: Medical

## 2019-08-16 ENCOUNTER — Other Ambulatory Visit: Payer: Self-pay

## 2019-08-16 VITALS — BP 130/83 | HR 110 | Temp 96.5°F | Resp 18 | Ht 68.0 in | Wt 202.0 lb

## 2019-08-16 DIAGNOSIS — M546 Pain in thoracic spine: Secondary | ICD-10-CM

## 2019-08-16 DIAGNOSIS — M869 Osteomyelitis, unspecified: Secondary | ICD-10-CM

## 2019-08-16 MED ORDER — LISINOPRIL 20 MG PO TABS: 20.0000 mg | ORAL_TABLET | Freq: Every day | ORAL | 3 refills | Status: DC

## 2019-08-16 MED ORDER — HYDROCODONE-ACETAMINOPHEN 5-325 MG PO TABS: 1.0000 | ORAL_TABLET | Freq: Four times a day (QID) | ORAL | 0 refills | Status: DC | PRN

## 2019-08-16 NOTE — Patient Instructions (Addendum)
Your bp is well controlled today. Refilled your lisinopril.  For diabetes continue current treatment. Maintain compliance as your sugar average are very good and toe is healing.  For chronic back pain, stop tramadol and will rx norco. Will see how you do with this and if working then place you on new contract.  Follow up in 2 weeks or as needed

## 2019-08-16 NOTE — Progress Notes (Signed)
Subjective:    Patient ID: Thomas [name], male    DOB: Mar 29, 1976, 44 y.o.   MRN: [id number]  HPI  Pt in for follow up.  His blood sugar average was 150.   Pt bp is good today . He was not taking bp medication. He is on zestril and hctz.  He is still having back pain. Last mri showed.   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 Similar extent of ventral and dorsal epidural inflammatory change with stable to slightly decreased moderate to severe spinal stenosis at T8-T9.   Pt states tramadol is not adequate for pain. Did not help. He states can't sleep due to severe pain.  He wants to minimize chance depending. He does not want to use oxycodone. Pt is willing to try hydrocodone.   Pt last neurosurgeon office visit assessment noted.  Osteomyelitis of spine (HCC)     He is improving slowly on chronic suppressive doxycycline therapy for severe, relapsing MRSA bacteremia and spine infection.  He will continue doxycycline and follow-up in 6 weeks.         Pt states his feet are looking better. Continue to heal.       Review of Systems  Constitutional: Negative for chills, fatigue and fever.  Respiratory: Negative for cough, chest tightness, shortness of breath and wheezing.   Cardiovascular: Negative for chest pain and palpitations.  Gastrointestinal: Negative for abdominal pain, nausea and vomiting.  Genitourinary: Negative for dysuria, frequency, hematuria and testicular pain.  Musculoskeletal: Positive for back pain.  Skin: Negative for rash.  Neurological: Negative for dizziness, syncope, weakness, numbness and headaches.  Hematological: Negative for adenopathy. Does not bruise/bleed easily.  Psychiatric/Behavioral: Negative for behavioral problems, confusion, sleep disturbance and suicidal ideas. The patient is not nervous/anxious.     Past Medical History:  Diagnosis Date  . Depression  01/27/2015  . Diabetes mellitus without complication (HCC)   . Hyperlipidemia   . Hypertension      Social History   Socioeconomic History  . Marital status: Single    Spouse name: Not on file  . Number of children: Not on file  . Years of education: Not on file  . Highest education level: Not on file  Occupational History  . Not on file  Tobacco Use  . Smoking status: Former Games developer  . Smokeless tobacco: Never Used  Substance and Sexual Activity  . Alcohol use: No    Alcohol/week: 0.0 standard drinks  . Drug use: No  . Sexual activity: Not on file  Other Topics Concern  . Not on file  Social History Narrative  . Not on file   Social Determinants of Health   Financial Resource Strain:   . Difficulty of Paying Living Expenses:   Food Insecurity:   . Worried About Programme researcher, broadcasting/film/video in the Last Year:   . Barista in the Last Year:   Transportation Needs:   . Freight forwarder (Medical):   Marland Kitchen Lack of Transportation (Non-Medical):   Physical Activity:   . Days of Exercise per Week:   . Minutes of Exercise per Session:   Stress:   . Feeling of Stress :   Social Connections:   . Frequency of Communication with Friends and Family:   . Frequency of Social Gatherings with Friends and Family:   . Attends Religious Services:   . Active Member of Clubs or Organizations:   . Attends Banker Meetings:   .  Marital Status:   Intimate Partner Violence:   . Fear of Current or Ex-Partner:   . Emotionally Abused:   Marland Kitchen Physically Abused:   . Sexually Abused:     Past Surgical History:  Procedure Laterality Date  . ABDOMINAL AORTOGRAM W/LOWER EXTREMITY Right 04/19/2019   Procedure: ABDOMINAL AORTOGRAM W/LOWER EXTREMITY;  Surgeon: [name], MD;  Location: [facility] INVASIVE CV LAB;  Service: Cardiovascular;  Laterality: Right;  . FEMORAL-TIBIAL BYPASS GRAFT Right 04/22/2019   Procedure: Right  FEMORAL-TIBIAL ARTERY BYPASS GRAFT with Harvest of Saphenous  vein RIGHT SUPERFICIAL FEMORAL ARTERY TO TIBIOPERONEAL TRUNK BYPASS;  Surgeon: [name], MD;  Location: [facility] OR;  Service: Vascular;  Laterality: Right;  . IR CERVICAL/THORACIC DISC ASPIRATION W/IMAG GUIDE  04/15/2019  . IR FLUORO GUIDED NEEDLE PLC ASPIRATION/INJECTION LOC  04/15/2019  . kidney stones  2009  . PERIPHERAL VASCULAR INTERVENTION  04/19/2019   Procedure: PERIPHERAL VASCULAR INTERVENTION;  Surgeon: [name], MD;  Location: [facility] INVASIVE CV LAB;  Service: Cardiovascular;;  right external iliac  . TEE WITHOUT CARDIOVERSION N/A 04/19/2019   Procedure: TRANSESOPHAGEAL ECHOCARDIOGRAM (TEE);  Surgeon: [name], MD;  Location: [facility] ENDOSCOPY;  Service: Endoscopy;  Laterality: N/A;    No family history on file.  No Known Allergies  Current Outpatient Medications on File Prior to Visit  Medication Sig Dispense Refill  . aspirin EC 81 MG tablet Take 1 tablet (81 mg total) by mouth daily. (Patient not taking: Reported on 06/08/2019) 150 tablet 2  . atorvastatin (LIPITOR) 10 MG tablet Take 1 tablet (10 mg total) by mouth daily at 6 PM. 30 tablet 2  . clopidogrel (PLAVIX) 75 MG tablet Take 1 tablet (75 mg total) by mouth daily with breakfast. 30 tablet 2  . doxycycline (VIBRA-TABS) 100 MG tablet Take 1 tablet (100 mg total) by mouth 2 (two) times daily. 60 tablet 3  . gabapentin (NEURONTIN) 100 MG capsule TAKE ONE CAPSULE BY MOUTH AT BEDTIME (Patient not taking: Reported on 06/21/2019) 30 capsule 0  . Heating Pads PADS Use over the counter heating pads as needed for back pain (Patient not taking: Reported on 08/03/2019) 1 Units 5  . hydrochlorothiazide (HYDRODIURIL) 25 MG tablet TAKE ONE TABLET BY MOUTH DAILY (Patient not taking: No sig reported) 30 tablet 0  . insulin glargine (LANTUS SOLOSTAR) 100 UNIT/ML Solostar Pen Inject 31 Units into the skin at bedtime. 15 mL 2  . Lancets MISC Use to check sugar once a day.  Dx E11.9 (Patient not taking: Reported on 08/03/2019)  100 each 1  . lidocaine (LIDODERM) 5 % Place 1 patch onto the skin daily. Remove & Discard patch within 12 hours or as directed by MD (Patient not taking: Reported on 06/21/2019) 30 patch 0  . lisinopril (ZESTRIL) 20 MG tablet TAKE ONE TABLET BY MOUTH DAILY (Patient not taking: Reported on 08/03/2019) 30 tablet 0  . metFORMIN (GLUCOPHAGE-XR) 500 MG 24 hr tablet TAKE TWO TABLETS BY MOUTH TWICE A DAY (Patient not taking: No sig reported) 120 tablet 0  . traMADol (ULTRAM) 50 MG tablet 1 tab po q 8 hours as needed prn pain (Patient not taking: Reported on 08/03/2019) 15 tablet 0   No current facility-administered medications on file prior to visit.    BP 130/83 (BP Location: Left Arm, Patient Position: Sitting, Cuff Size: Large)   Pulse (!) 110   Temp (!) 96.5 F (35.8 C) (Temporal)   Resp 18   Ht 5\' 8"  (1.727 m)  Wt 202 lb (91.6 kg)   SpO2 99%   BMI 30.71 kg/m       Objective:   Physical Exam  General- No acute distress. Pleasant patient. Neck- Full range of motion, no jvd Lungs- Clear, even and unlabored. Heart- regular rate and rhythm. Neurologic- CNII- XII grossly intact.  Rt foot- second toe healing well.      Assessment & Plan:  Your bp is well controlled today. Refilled your lisinopril.  For diabetes continue current treatment. Maintain compliance as your sugar average are very good and toe is healing.  For chronic back pain, stop tramadol and will rx norco. Will see how you do with this and if working then place you on new contract.  Follow up in 2 weeks or as needed

## 2019-08-30 ENCOUNTER — Ambulatory Visit: Payer: No Typology Code available for payment source | Admitting: Medical

## 2019-09-02 ENCOUNTER — Telehealth: Payer: Self-pay

## 2019-09-02 NOTE — Telephone Encounter (Signed)
Patient called in to get a medication refill for insulin glargine (LANTUS SOLOSTAR) 100 UNIT/ML Solostar Pen [[id number]]    Please send it to [facility] - [place], [place] - [address]  [address], [place] [address]  Phone:  [phone] Fax:  [phone]  DEA #:  --

## 2019-09-03 ENCOUNTER — Other Ambulatory Visit: Payer: Self-pay

## 2019-09-03 MED ORDER — LANTUS SOLOSTAR 100 UNIT/ML ~~LOC~~ SOPN: 31.0000 [IU] | PEN_INJECTOR | Freq: Every day | SUBCUTANEOUS | 2 refills | Status: DC

## 2019-09-03 NOTE — Telephone Encounter (Signed)
RX sent to requested pharmacy. Left detailed message

## 2019-09-03 NOTE — Telephone Encounter (Signed)
LM for pt to return call. Last refill done 08/03/19 #15 with 2 refills to different pharmacy from requested.

## 2019-09-06 ENCOUNTER — Ambulatory Visit (INDEPENDENT_AMBULATORY_CARE_PROVIDER_SITE_OTHER)
Admission: RE | Admit: 2019-09-06 | Discharge: 2019-09-06 | Disposition: A | Discharge status: Home / Self Care | Payer: Medicaid Other | Source: Ambulatory Visit | Attending: Surgery | Admitting: Surgery

## 2019-09-06 ENCOUNTER — Other Ambulatory Visit: Payer: Self-pay

## 2019-09-06 ENCOUNTER — Ambulatory Visit (HOSPITAL_COMMUNITY)
Admission: RE | Admit: 2019-09-06 | Discharge: 2019-09-06 | Disposition: A | Discharge status: Home / Self Care | Payer: Medicaid Other | Source: Ambulatory Visit | Attending: Surgery | Admitting: Surgery

## 2019-09-06 ENCOUNTER — Ambulatory Visit (INDEPENDENT_AMBULATORY_CARE_PROVIDER_SITE_OTHER): Payer: No Typology Code available for payment source | Admitting: Physician Assistant

## 2019-09-06 VITALS — BP 114/76 | HR 100 | Temp 97.2°F | Resp 18 | Wt 200.0 lb

## 2019-09-06 DIAGNOSIS — I739 Peripheral vascular disease, unspecified: Secondary | ICD-10-CM | POA: Insufficient documentation

## 2019-09-06 MED ORDER — ATORVASTATIN CALCIUM 10 MG PO TABS: 10.0000 mg | ORAL_TABLET | Freq: Every day | ORAL | 2 refills | Status: DC

## 2019-09-06 MED ORDER — CLOPIDOGREL BISULFATE 75 MG PO TABS: 75.0000 mg | ORAL_TABLET | Freq: Every day | ORAL | 2 refills | Status: DC

## 2019-09-06 NOTE — Progress Notes (Signed)
Office Note     CC:  follow up Requesting Provider:  [name], PA-C  HPI: Thomas [name] is a 44 y.o. (1975/09/10) male who presents for follow up of peripheral vascular disease. He is s/p right iliac artery stenting and right above knee popliteal artery to Tibioperoneal trunk bypass with vein by Dr. Chestine Spore on 04/22/19. At time of his last visit his wounds on his right foot were healing. He says today they are all healed except 2nd toe. He occasionally has pain in his right toes. He says since his last visit in February with Dr. Chestine Spore he has had increased coldness and numbness in his right foot that keeps him awake at night. He has to put a sock on and sometimes dangling his foot off the bed improves his discomfort. He says during the day the coldness and numbing pain is improved. He does have same but tolerable feeling in the left foot. He has been trying not to take hydrocodone for pain and has been taking Ibuprofen when needed.  He has not been taking his statin or Plavix. He states he finished bottle and did not get any refills because he did not know he was suppose to continue taking the medications.  He continues to have back pain. This pain additionally keeps him awake at night. He uses a rolling walker to get around due to pain and inability to walk far or stand for prolonged periods of time. He states he can barely ambulate due to both pain in his back and also numbness in his legs. He remains on Doxycycline since March for severe relapsing MRSA bacteremia and discitis per Infectious disease  The pt not currently on a statin for cholesterol management.  The pt is on a daily aspirin.   Other AC:  None The pt is on ACE for hypertension.   The pt is diabetic.  On insulin Tobacco hx: Former smoker  Past Medical History:  Diagnosis Date  . Depression 01/27/2015  . Diabetes mellitus without complication (HCC)   . Hyperlipidemia   . Hypertension     Past Surgical History:    Procedure Laterality Date  . ABDOMINAL AORTOGRAM W/LOWER EXTREMITY Right 04/19/2019   Procedure: ABDOMINAL AORTOGRAM W/LOWER EXTREMITY;  Surgeon: [name], MD;  Location: [facility] INVASIVE CV LAB;  Service: Cardiovascular;  Laterality: Right;  . FEMORAL-TIBIAL BYPASS GRAFT Right 04/22/2019   Procedure: Right  FEMORAL-TIBIAL ARTERY BYPASS GRAFT with Harvest of Saphenous vein RIGHT SUPERFICIAL FEMORAL ARTERY TO TIBIOPERONEAL TRUNK BYPASS;  Surgeon: [name], MD;  Location: [facility] OR;  Service: Vascular;  Laterality: Right;  . IR CERVICAL/THORACIC DISC ASPIRATION W/IMAG GUIDE  04/15/2019  . IR FLUORO GUIDED NEEDLE PLC ASPIRATION/INJECTION LOC  04/15/2019  . kidney stones  2009  . PERIPHERAL VASCULAR INTERVENTION  04/19/2019   Procedure: PERIPHERAL VASCULAR INTERVENTION;  Surgeon: [name], MD;  Location: [facility] INVASIVE CV LAB;  Service: Cardiovascular;;  right external iliac  . TEE WITHOUT CARDIOVERSION N/A 04/19/2019   Procedure: TRANSESOPHAGEAL ECHOCARDIOGRAM (TEE);  Surgeon: [name], MD;  Location: [facility] ENDOSCOPY;  Service: Endoscopy;  Laterality: N/A;    Social History   Socioeconomic History  . Marital status: Single    Spouse name: Not on file  . Number of children: Not on file  . Years of education: Not on file  . Highest education level: Not on file  Occupational History  . Not on file  Tobacco Use  . Smoking status: Former Games developer  . Smokeless  tobacco: Never Used  Substance and Sexual Activity  . Alcohol use: No    Alcohol/week: 0.0 standard drinks  . Drug use: No  . Sexual activity: Not on file  Other Topics Concern  . Not on file  Social History Narrative  . Not on file   Social Determinants of Health   Financial Resource Strain:   . Difficulty of Paying Living Expenses:   Food Insecurity:   . Worried About Programme researcher, broadcasting/film/video in the Last Year:   . Barista in the Last Year:   Transportation Needs:   . Freight forwarder  (Medical):   Marland Kitchen Lack of Transportation (Non-Medical):   Physical Activity:   . Days of Exercise per Week:   . Minutes of Exercise per Session:   Stress:   . Feeling of Stress :   Social Connections:   . Frequency of Communication with Friends and Family:   . Frequency of Social Gatherings with Friends and Family:   . Attends Religious Services:   . Active Member of Clubs or Organizations:   . Attends Banker Meetings:   Marland Kitchen Marital Status:   Intimate Partner Violence:   . Fear of Current or Ex-Partner:   . Emotionally Abused:   Marland Kitchen Physically Abused:   . Sexually Abused:    History reviewed. No pertinent family history.  Current Outpatient Medications  Medication Sig Dispense Refill  . aspirin EC 81 MG tablet Take 1 tablet (81 mg total) by mouth daily. (Patient not taking: Reported on 06/08/2019) 150 tablet 2  . atorvastatin (LIPITOR) 10 MG tablet Take 1 tablet (10 mg total) by mouth daily at 6 PM. 30 tablet 2  . clopidogrel (PLAVIX) 75 MG tablet Take 1 tablet (75 mg total) by mouth daily with breakfast. 30 tablet 2  . doxycycline (VIBRA-TABS) 100 MG tablet Take 1 tablet (100 mg total) by mouth 2 (two) times daily. 60 tablet 3  . gabapentin (NEURONTIN) 100 MG capsule TAKE ONE CAPSULE BY MOUTH AT BEDTIME (Patient not taking: Reported on 06/21/2019) 30 capsule 0  . Heating Pads PADS Use over the counter heating pads as needed for back pain (Patient not taking: Reported on 08/03/2019) 1 Units 5  . HYDROcodone-acetaminophen (NORCO) 5-325 MG tablet Take 1-2 tablets by mouth every 6 (six) hours as needed for moderate pain. 24 tablet 0  . insulin glargine (LANTUS SOLOSTAR) 100 UNIT/ML Solostar Pen Inject 31 Units into the skin at bedtime. 15 mL 2  . Lancets MISC Use to check sugar once a day.  Dx E11.9 (Patient not taking: Reported on 08/03/2019) 100 each 1  . lidocaine (LIDODERM) 5 % Place 1 patch onto the skin daily. Remove & Discard patch within 12 hours or as directed by MD (Patient  not taking: Reported on 06/21/2019) 30 patch 0  . lisinopril (ZESTRIL) 20 MG tablet Take 1 tablet (20 mg total) by mouth daily. 90 tablet 3  . metFORMIN (GLUCOPHAGE-XR) 500 MG 24 hr tablet TAKE TWO TABLETS BY MOUTH TWICE A DAY (Patient not taking: No sig reported) 120 tablet 0   No current facility-administered medications for this visit.    No Known Allergies   REVIEW OF SYSTEMS:  Review of Systems  Constitutional: Negative for chills, fever and malaise/fatigue.  Respiratory: Negative for cough and shortness of breath.   Cardiovascular: Negative for chest pain, palpitations and leg swelling.  Gastrointestinal: Negative for abdominal pain, constipation, diarrhea, nausea and vomiting.  Musculoskeletal: Positive for back pain.  Neurological: Negative for dizziness, tingling and weakness.  Endo/Heme/Allergies: Does not bruise/bleed easily.    PHYSICAL EXAMINATION:  Vitals:   09/06/19 1427  BP: 114/76  Pulse: 100  Resp: 18  Temp: (!) 97.2 F (36.2 C)  TempSrc: Temporal  SpO2: 99%  Weight: 200 lb (90.7 kg)    General:  WDWN in NAD; vital signs documented above Gait: shuffling gate, uses wheeling walker HENT: WNL, normocephalic Pulmonary: normal non-labored breathing , without Rales, rhonchi,  wheezing Cardiac: regular HR, without  Murmurs without carotid bruit Abdomen: soft, NT, no masses Skin: without rashes Vascular Exam/Pulses: right forefoot cool, duskiness of right 2nd toe with dry ulceration on the distal/plantar aspect of toe, other toes well healed. Motor and sensory intact. Left foot warm. Lower extremity incisions all well healed. Doppler PT/DP/ Pero right foot. Doppler PT/ faint pero left foot  Right Left  Radial 2+ (normal) 2+ (normal)  Ulnar 2+ (normal) 2+ (normal)  Femoral 2+ (normal) 2+ (normal)  Popliteal Not palpable Not palpable  DP Not palpable Not palpable  PT Not palpable Not palpable   Extremities:without Gangrene , without cellulitis; without open  wounds;  Musculoskeletal: no muscle wasting or atrophy  Neurologic: A&O X 3;  No focal weakness or paresthesias are detected Psychiatric:  The pt has Normal affect.   Non-Invasive Vascular Imaging:   Patient right above knee to tibioperoneal bypass graft with increased velocity in the outflow artery with 50-74% stenosis (PSV of 315 cm/s), however limited visualization  +-------+-----------+-----------+------------+------------+  ABI/TBIToday's ABIToday's TBIPrevious ABIPrevious TBI  +-------+-----------+-----------+------------+------------+  Right 0.95    0.65    0.70    0.52      +-------+-----------+-----------+------------+------------+  Left  0.70    0.53    0.82    0.55      +-------+-----------+-----------+------------+------------+    ASSESSMENT/PLAN:: 44 y.o. male here for follow up for peripheral vascular disease. He is s/p right iliac artery stenting and right above knee popliteal artery to Tibioperoneal trunk bypass with vein by Dr. Carlis Abbott on 04/22/19. His wounds are almost completely healed. His right lower extremity is well perfused with Doppler DP/PT/pero signals. He however is essentially having rest pain at night with coldness in his right foot. His right foot is somewhat cool to touch on examination and I am concerned with the elevated velocities seen on duplex evaluation today in the distal anastomosis. My recommendation would be for patient to have angiogram of the right lower extremity with possible intervention on the bypass graft. I discussed with him the risks/benefits of the procedure and he is agreeable to proceed. I also recommend continuing his aspirin and I have sent refills for Plavix and statin to his pharmacy - He will be scheduled for Angiogram with right lower extremity arteriogram with Dr. Carlis Abbott and next earliest availability    Karoline Caldwell, PA-C [facility] [phone]  Clinic MD:   Dr.  Trula Slade

## 2019-09-07 ENCOUNTER — Other Ambulatory Visit: Payer: Self-pay

## 2019-09-09 ENCOUNTER — Encounter: Payer: Self-pay | Admitting: Medical

## 2019-09-09 ENCOUNTER — Other Ambulatory Visit: Payer: Self-pay

## 2019-09-09 ENCOUNTER — Ambulatory Visit (INDEPENDENT_AMBULATORY_CARE_PROVIDER_SITE_OTHER): Payer: No Typology Code available for payment source | Admitting: Medical

## 2019-09-09 VITALS — BP 128/80 | HR 76 | Resp 18 | Ht 68.0 in | Wt 207.8 lb

## 2019-09-09 DIAGNOSIS — E119 Type 2 diabetes mellitus without complications: Secondary | ICD-10-CM

## 2019-09-09 DIAGNOSIS — M546 Pain in thoracic spine: Secondary | ICD-10-CM

## 2019-09-09 DIAGNOSIS — I739 Peripheral vascular disease, unspecified: Secondary | ICD-10-CM

## 2019-09-09 DIAGNOSIS — I10 Essential (primary) hypertension: Secondary | ICD-10-CM

## 2019-09-09 MED ORDER — HYDROCODONE-ACETAMINOPHEN 5-325 MG PO TABS: ORAL_TABLET | ORAL | 0 refills | Status: DC

## 2019-09-09 MED ORDER — ATORVASTATIN CALCIUM 10 MG PO TABS: 10.0000 mg | ORAL_TABLET | Freq: Every day | ORAL | 2 refills | Status: DC

## 2019-09-09 NOTE — Patient Instructions (Addendum)
For your severe chronic upper back pain related to history of osteomyelitis, compression deformity and spinal stenosis, I did refill your Norco.  Sent over 5/325 doses to use 1 tablet every 8 hours as needed as needed severe pain.  For right lower extremity peripheral vascular disease, continue to follow-up with vascular surgeon for procedure on June 3.  Keep specialist infectious disease appointment on September 14, 2019.  Diabetes seems to be well controlled recently continue current regimen.  Blood pressure appears to be controlled as well.  Continue lisinopril.  Had discussion regarding disability.  Today's visit was translated by one of his friends who called in.  Patient has been trying to get disability and has a Clinical research associate.  He states that lawyer needs primary care to have attest to his disability.  I explained to patient through interpreter that I do not routinely fill out paperwork for permanent disability.  It appears that he would likely qualify for some degree of  disability but I would need his specialist to verify that they think he has permanent disability  I would like to review the paperwork that they want me to fill out.  Then I would reach out to his specialist before making any decision.  I made no explicit promise to r the permanent disability paperwork.   Follow-up in 1 month or as needed.  On follow-up will get CMP, lipid panel and A1c.

## 2019-09-09 NOTE — Progress Notes (Signed)
Subjective:    Patient ID: Thomas [name], male    DOB: Mar 07, 1976, 43 y.o.   MRN: [id number]    HPI  Patient is in today for follow-up.    He had history of upper back pain due to history of discitis/osteomyelitis and T9 compression deformity.  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 Similar extent of ventral and dorsal epidural inflammatory change with stable to slightly decreased moderate to severe spinal stenosis at T8-T9.  Patient has been admitted and received IV antibiotics.  Then he was on oral antibiotics and has seen infectious disease MD in the past.  On the last visit I did prescribe him Norco for the chronic pain and he states that it did work well.  He was able to sleep/control the pain.  Previously he stated that he had difficulty sleeping due to high level pain.  He filled out/signed controlled medication contract and give UDS on last visit.  He is improving slowly on chronic suppressive doxycycline therapy for severe, relapsing MRSA bacteremia and spine infection.  He will continue doxycycline and follow-up in 6 weeks.  Patient indicates that you going to have follow-up for his back pain with specialist on June 1.  Patient also has peripheral vascular disease and will get angiogram done on June 3.  He has history of right second toe poorly/slowly healing wound.  Wound has finally healed presently.  Below is the plan from vascular surgeon of visit today.  ASSESSMENT/PLAN:: 44 y.o. male here for follow up for peripheral vascular disease. He is s/p right iliac artery stenting and right above knee popliteal artery to Tibioperoneal trunk bypass with vein by Dr. Chestine Spore on 04/22/19. His wounds are almost completely healed. His right lower extremity is well perfused with Doppler DP/PT/pero signals. He however is essentially having rest pain at night with coldness in his right foot. His right foot is  somewhat cool to touch on examination and I am concerned with the elevated velocities seen on duplex evaluation today in the distal anastomosis. My recommendation would be for patient to have angiogram of the right lower extremity with possible intervention on the bypass graft. I discussed with him the risks/benefits of the procedure and he is agreeable to proceed. I also recommend continuing his aspirin and I have sent refills for Plavix and statin to his pharmacy - He will be scheduled for Angiogram with right lower extremity arteriogram with Dr. Chestine Spore and next earliest availability   Patient's diabetes has been controlled recently.  Most of his sugars have been in the 120 range.  Patient will get rare 150-160.  Patient is on Lantus 31 units at night and Metformin.  Blood pressure is well controlled today and he is on lisinopril 20 mg daily.     Review of Systems  Constitutional: Negative for chills, fatigue and fever.  Respiratory: Negative for cough, chest tightness, shortness of breath and wheezing.   Cardiovascular: Negative for chest pain and palpitations.  Gastrointestinal: Negative for abdominal pain.  Musculoskeletal: Positive for back pain.  Skin: Negative for rash.  Neurological: Negative for dizziness, seizures, syncope, weakness and light-headedness.  Hematological: Negative for adenopathy. Does not bruise/bleed easily.  Psychiatric/Behavioral: Negative for behavioral problems and confusion.    Past Medical History:  Diagnosis Date   Depression 01/27/2015   Diabetes mellitus without complication (HCC)    Hyperlipidemia    Hypertension      Social History   Socioeconomic History   Marital status:  Single    Spouse name: Not on file   Number of children: Not on file   Years of education: Not on file   Highest education level: Not on file  Occupational History   Not on file  Tobacco Use   Smoking status: Former Smoker   Smokeless tobacco: Never Used    Substance and Sexual Activity   Alcohol use: No    Alcohol/week: 0.0 standard drinks   Drug use: No   Sexual activity: Not on file  Other Topics Concern   Not on file  Social History Narrative   Not on file   Social Determinants of Health   Financial Resource Strain:    Difficulty of Paying Living Expenses:   Food Insecurity:    Worried About Programme researcher, broadcasting/film/video in the Last Year:    Barista in the Last Year:   Transportation Needs:    Freight forwarder (Medical):    Lack of Transportation (Non-Medical):   Physical Activity:    Days of Exercise per Week:    Minutes of Exercise per Session:   Stress:    Feeling of Stress :   Social Connections:    Frequency of Communication with Friends and Family:    Frequency of Social Gatherings with Friends and Family:    Attends Religious Services:    Active Member of Clubs or Organizations:    Attends Banker Meetings:    Marital Status:   Intimate Partner Violence:    Fear of Current or Ex-Partner:    Emotionally Abused:    Physically Abused:    Sexually Abused:     Past Surgical History:  Procedure Laterality Date   ABDOMINAL AORTOGRAM W/LOWER EXTREMITY Right 04/19/2019   Procedure: ABDOMINAL AORTOGRAM W/LOWER EXTREMITY;  Surgeon: [name], MD;  Location: [facility];  Service: Cardiovascular;  Laterality: Right;   FEMORAL-TIBIAL BYPASS GRAFT Right 04/22/2019   Procedure: Right  FEMORAL-TIBIAL ARTERY BYPASS GRAFT with Harvest of Saphenous vein RIGHT SUPERFICIAL FEMORAL ARTERY TO TIBIOPERONEAL TRUNK BYPASS;  Surgeon: [name], MD;  Location: [facility] OR;  Service: Vascular;  Laterality: Right;   IR CERVICAL/THORACIC DISC ASPIRATION W/IMAG GUIDE  04/15/2019   IR FLUORO GUIDED NEEDLE PLC ASPIRATION/INJECTION LOC  04/15/2019   kidney stones  2009   PERIPHERAL VASCULAR INTERVENTION  04/19/2019   Procedure: PERIPHERAL VASCULAR INTERVENTION;  Surgeon: [name], MD;  Location: [facility];  Service: Cardiovascular;;  right external iliac   TEE WITHOUT CARDIOVERSION N/A 04/19/2019   Procedure: TRANSESOPHAGEAL ECHOCARDIOGRAM (TEE);  Surgeon: [name], MD;  Location: [facility] ENDOSCOPY;  Service: Endoscopy;  Laterality: N/A;    No family history on file.  No Known Allergies  Current Outpatient Medications on File Prior to Visit  Medication Sig Dispense Refill   aspirin EC 81 MG tablet Take 1 tablet (81 mg total) by mouth daily. (Patient not taking: Reported on 06/08/2019) 150 tablet 2   clopidogrel (PLAVIX) 75 MG tablet Take 1 tablet (75 mg total) by mouth daily with breakfast. 30 tablet 2   doxycycline (VIBRA-TABS) 100 MG tablet Take 1 tablet (100 mg total) by mouth 2 (two) times daily. (Patient not taking: Reported on 09/09/2019) 60 tablet 3   gabapentin (NEURONTIN) 100 MG capsule TAKE ONE CAPSULE BY MOUTH AT BEDTIME (Patient not taking: Reported on 06/21/2019) 30 capsule 0   Heating Pads PADS Use over the counter heating pads as needed for back pain (Patient not  taking: Reported on 08/03/2019) 1 Units 5   insulin glargine (LANTUS SOLOSTAR) 100 UNIT/ML Solostar Pen Inject 31 Units into the skin at bedtime. 15 mL 2   Lancets MISC Use to check sugar once a day.  Dx E11.9 100 each 1   lidocaine (LIDODERM) 5 % Place 1 patch onto the skin daily. Remove & Discard patch within 12 hours or as directed by MD (Patient not taking: Reported on 06/21/2019) 30 patch 0   lisinopril (ZESTRIL) 20 MG tablet Take 1 tablet (20 mg total) by mouth daily. 90 tablet 3   metFORMIN (GLUCOPHAGE-XR) 500 MG 24 hr tablet TAKE TWO TABLETS BY MOUTH TWICE A DAY (Patient not taking: No sig reported) 120 tablet 0   No current facility-administered medications on file prior to visit.    BP 128/80    Pulse 76    Resp 18    Ht 5\' 8"  (1.727 m)    Wt 207 lb 12.8 oz (94.3 kg)    SpO2 100%    BMI 31.60 kg/m       Objective:   Physical Exam  General-  No acute distress. Pleasant patient. But does appear to be in pain today during interview.  Neck- Full range of motion, no jvd Lungs- Clear, even and unlabored. Heart- regular rate and rhythm. Neurologic- CNII- XII grossly intact. Rt foot- rt second toe looks well healed.      Assessment & Plan:  For your severe chronic upper back pain related to history of osteomyelitis, compression deformity and spinal stenosis, I did refill your Norco.  Sent over 5/325 doses to use 1 tablet every 8 hours as needed as needed severe pain.  For right lower extremity peripheral vascular disease, continue to follow-up with vascular surgeon for procedure on June 3.  Keep specialist infectious disease appointment on September 14, 2019.  Diabetes seems to be well controlled recently continue current regimen.  Blood pressure appears to be controlled as well.  Continue lisinopril.  Had discussion regarding disability.  Today's visit was translated by one of his friends who called in.  Patient has been trying to get disability and has a Chief Executive Officer.  He states that lawyer needs primary care to have test to his disability.  I explained to patient through interpreter that I do not routinely fill out paperwork for permanent disability.  It appears that he would likely qualify for disability but I would need his specialist to verify that they think he has permanent disabled.  I would like to review the paperwork that they want me to fill out.  Then I would reach out to his specialist before making any decision.  I made no explicit promise to r the permanent disability paperwork.   Follow-up in 1 month or as needed.  On follow-up will get CMP, lipid panel and A1c.  Mackie Pai, PA-C   Time spent with patient today was 40  minutes which consisted of chart review, discussing diagnosis, work up treatment and documentation.

## 2019-09-14 ENCOUNTER — Ambulatory Visit (INDEPENDENT_AMBULATORY_CARE_PROVIDER_SITE_OTHER): Payer: No Typology Code available for payment source | Admitting: Internal Medicine

## 2019-09-14 ENCOUNTER — Ambulatory Visit
Admission: RE | Admit: 2019-09-14 | Discharge: 2019-09-14 | Disposition: A | Discharge status: Home / Self Care | Payer: No Typology Code available for payment source | Source: Ambulatory Visit | Attending: Internal Medicine | Admitting: Internal Medicine

## 2019-09-14 ENCOUNTER — Other Ambulatory Visit: Payer: Self-pay

## 2019-09-14 ENCOUNTER — Other Ambulatory Visit: Payer: Self-pay | Admitting: Internal Medicine

## 2019-09-14 ENCOUNTER — Encounter: Payer: Self-pay | Admitting: Internal Medicine

## 2019-09-14 DIAGNOSIS — M4624 Osteomyelitis of vertebra, thoracic region: Secondary | ICD-10-CM | POA: Diagnosis not present

## 2019-09-14 DIAGNOSIS — M4620 Osteomyelitis of vertebra, site unspecified: Secondary | ICD-10-CM

## 2019-09-14 IMAGING — CR DG THORACIC SPINE 3V
4 series · 4 of 4 positions shown · non-contrast
Comparison: Thoracic MRI [DATE]

CLINICAL DATA: Previous discitis and osteomyelitis

EXAM:
THORACIC SPINE - 3 VIEWS

[t t-spine a.p.]
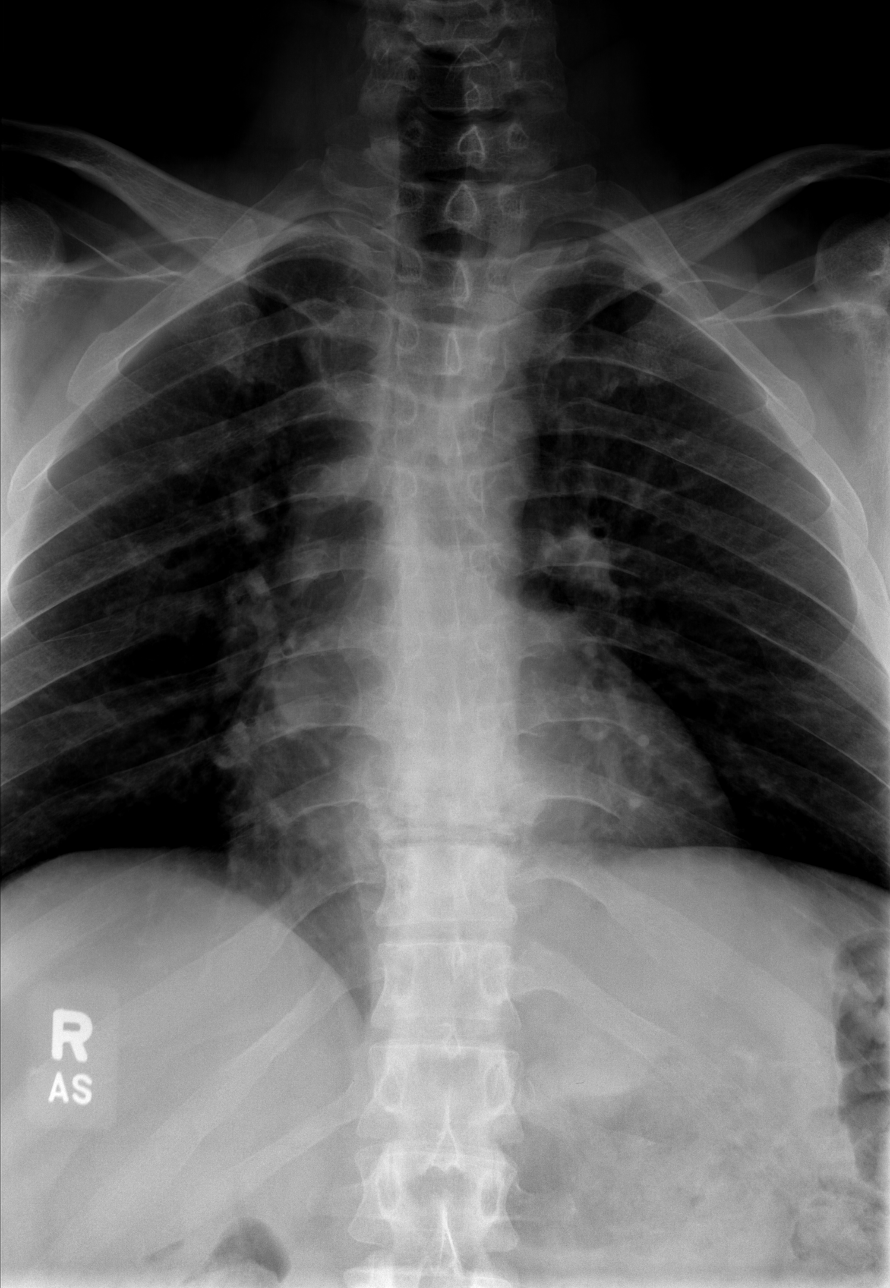

[t t-spine lat * (1 of 2)]
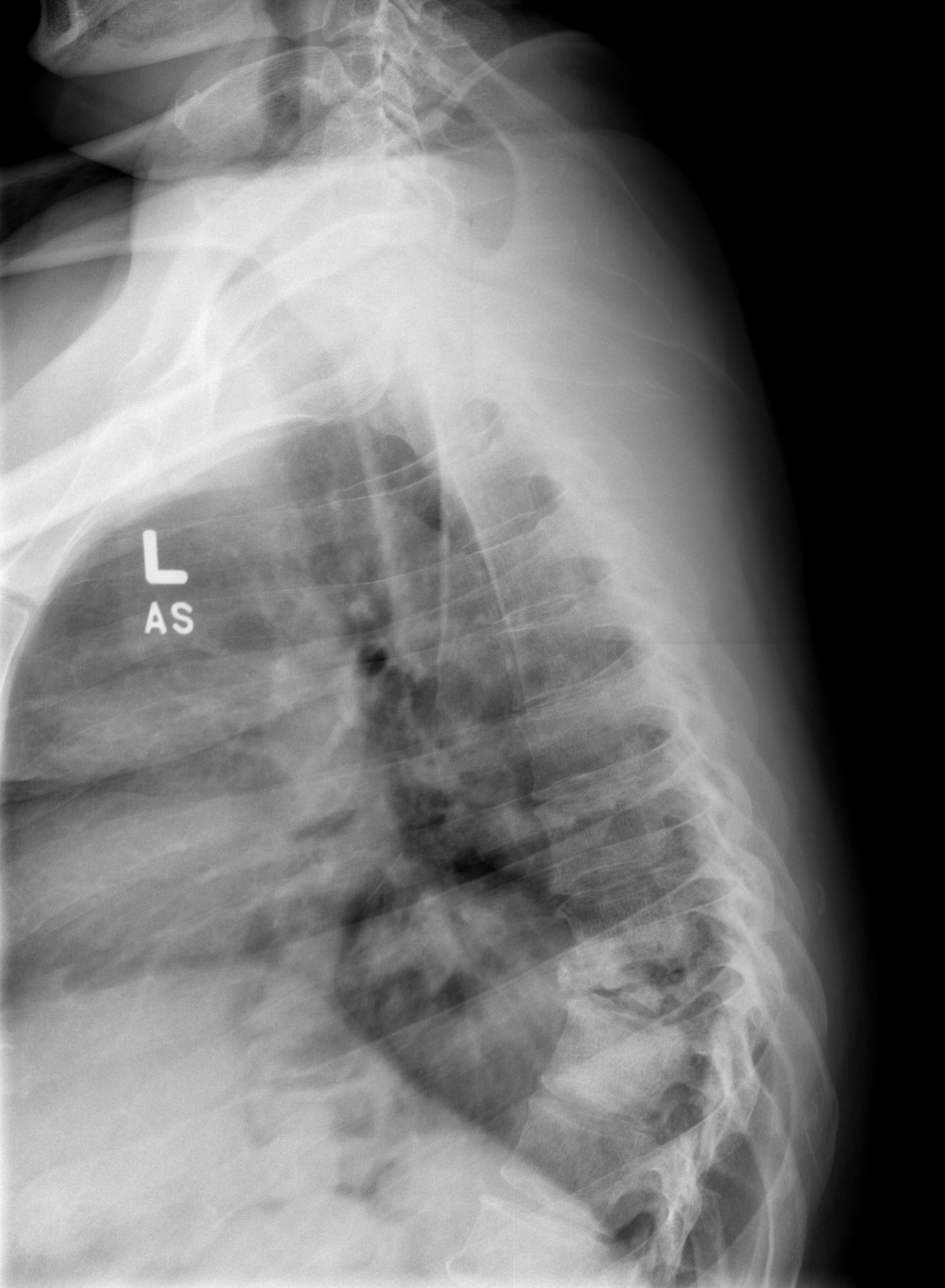

[t t-spine lat * (2 of 2)]
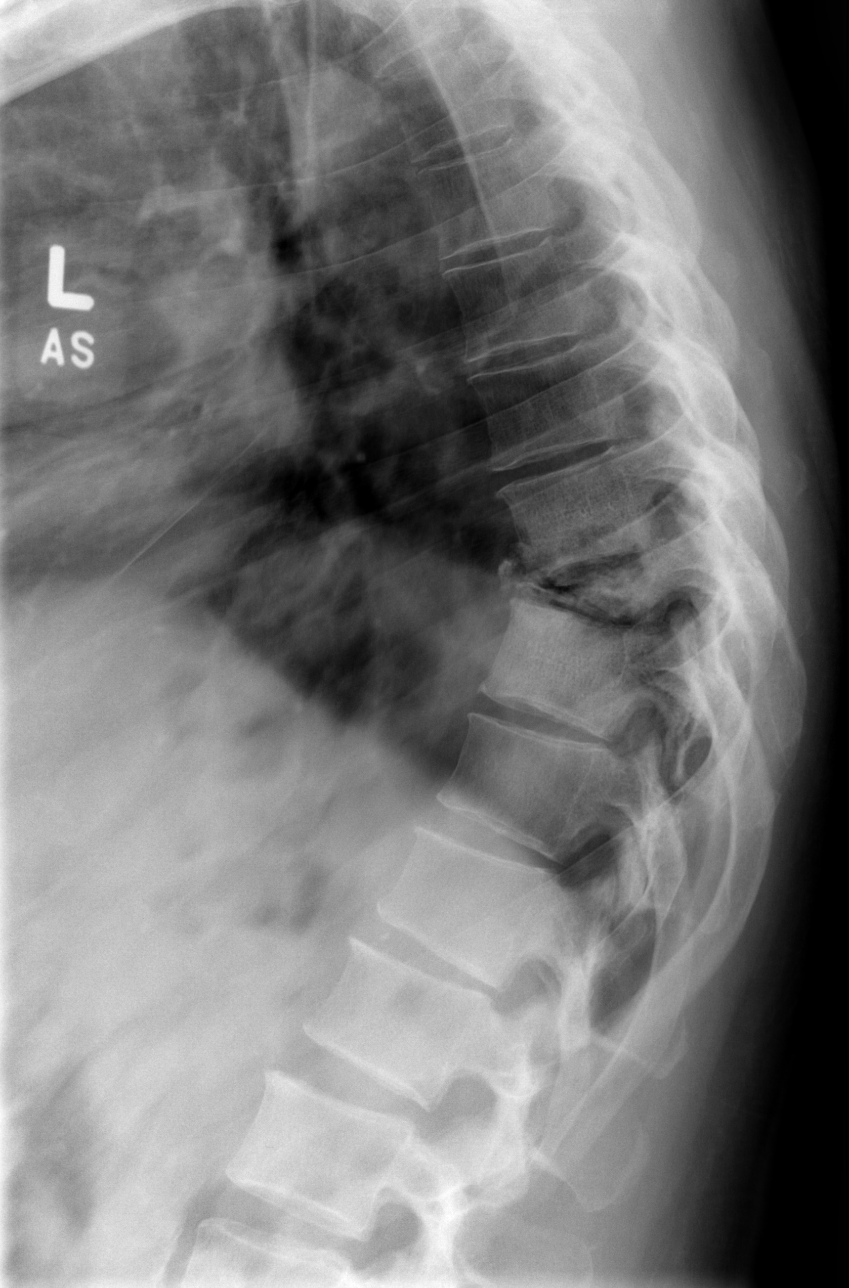

[t swimmers]
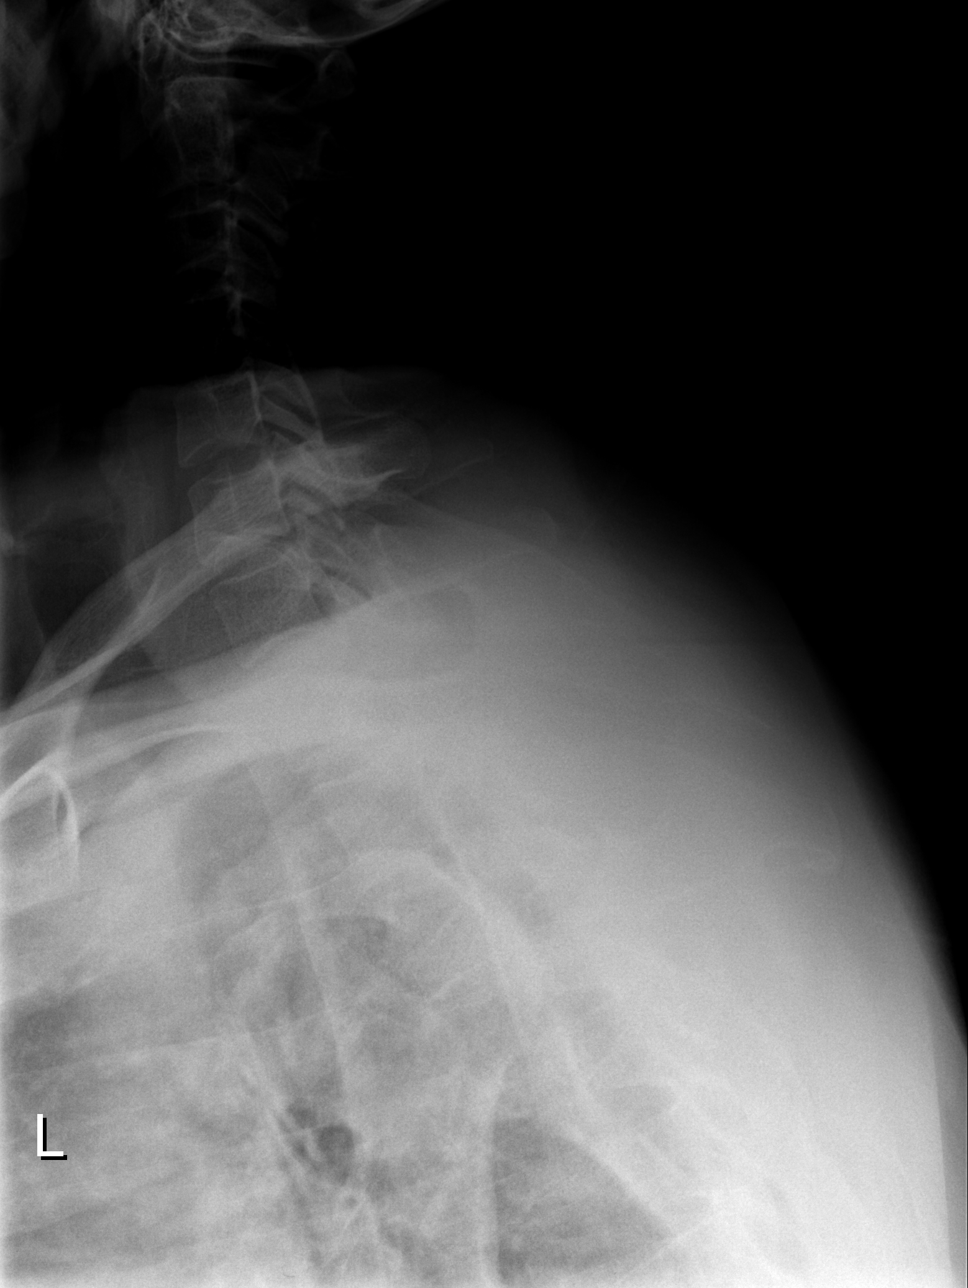

[4 of 4 positions shown; findings below may reference images not displayed]

FINDINGS: Frontal, lateral, and swimmer's views were obtained. There is marked
collapse of the T9 vertebral body with localized kyphosis at T8-9.
Lucency is noted in the inferior aspect of the T8 vertebral body
which appears essentially stable compared to the previous study.
More subtle lucency is noted along the superior aspect of the T10
vertebral body, also present previously. There is no obvious soft
tissue extension of abnormality from this area. No new vertebral
involvement. No new fracture in particular. No spondylolisthesis. No
new disc space narrowing or erosion. Visualized lungs clear. No
paraspinous lesions evident.
IMPRESSION: Changes of previous discitis osteomyelitis centered at T9 with
marked collapse at T9. Lucency in the inferior aspect of the T8
vertebral body and more subtly in the superior aspect of the T10
vertebral body corresponds with abnormality noted on previous MR. No
new fracture or new obvious bony involvement. No soft tissue
extension of infection noted. Other vertebral body levels appear
unremarkable.

It should be noted that MR would be the optimum study of choice to
assess for soft tissue abnormality in this area which potentially
could be masked by radiography.

## 2019-09-14 NOTE — Progress Notes (Signed)
[facility]  Patient Active Problem List   Diagnosis Date Noted  . Discitis of thoracolumbar region     Priority: High  . MRSA bacteremia     Priority: High  . Endocarditis of mitral valve     Priority: High  . Gangrene of toe of right foot (HCC) 04/14/2019    Priority: High  . Osteomyelitis of spine (HCC) 04/13/2019    Priority: High  . PAD (peripheral artery disease) (HCC) 06/08/2019  . Hematoma 05/09/2019  . Rectal mass 04/14/2019  . DVT of popliteal vein (HCC) 04/14/2019  . Diabetes mellitus type II, uncontrolled (HCC) 01/27/2015  . Diabetic neuropathy (HCC) 01/27/2015  . HTN (hypertension) 01/27/2015  . Depression 01/27/2015  . Hyperlipidemia 01/27/2015    Patient's Medications  New Prescriptions   No medications on file  Previous Medications   ASPIRIN EC 81 MG TABLET    Take 1 tablet (81 mg total) by mouth daily.   ATORVASTATIN (LIPITOR) 10 MG TABLET    Take 1 tablet (10 mg total) by mouth daily at 6 PM.   CLOPIDOGREL (PLAVIX) 75 MG TABLET    Take 1 tablet (75 mg total) by mouth daily with breakfast.   DOXYCYCLINE (VIBRA-TABS) 100 MG TABLET    Take 1 tablet (100 mg total) by mouth 2 (two) times daily.   GABAPENTIN (NEURONTIN) 100 MG CAPSULE    TAKE ONE CAPSULE BY MOUTH AT BEDTIME   HEATING PADS PADS    Use over the counter heating pads as needed for back pain   HYDROCODONE-ACETAMINOPHEN (NORCO) 5-325 MG TABLET    1 tab po every 8 hours as needed for pain   INSULIN GLARGINE (LANTUS SOLOSTAR) 100 UNIT/ML SOLOSTAR PEN    Inject 31 Units into the skin at bedtime.   LANCETS MISC    Use to check sugar once a day.  Dx E11.9   LIDOCAINE (LIDODERM) 5 %    Place 1 patch onto the skin daily. Remove & Discard patch within 12 hours or as directed by MD   LISINOPRIL (ZESTRIL) 20 MG TABLET    Take 1 tablet (20 mg total) by mouth daily.   METFORMIN (GLUCOPHAGE-XR) 500 MG 24 HR TABLET    TAKE TWO TABLETS BY MOUTH TWICE A DAY  Modified Medications    No medications on file  Discontinued Medications   No medications on file    Subjective: Thomas [name] is in for his routine follow-up visit. He was hospitalized on 01/31/2019 with MRSA bacteremia complicated by lower thoracic spine infection and mitral valve endocarditis.  He had persistent bacteremia for 2 weeks and was switched from vancomycin to daptomycin and ceftaroline.  He was discharged on daptomycin alone, completing therapy on 04/04/2019.    He started to feel worse shortly after completing antibiotic therapy and noted some worrisome changes in the toes of his right foot leading to readmission here on 04/14/2019.  Both blood cultures from 04/13/2019 and 1 of 2 from 04/14/2019 have grown MRSA.  MRI showed progressive infection at the T9 level with new discitis at T8 and T10.  MRI of his right foot showed no definite osteomyelitis or abscess.  He underwent TEE which did not show any vegetations or other evidence of persistent endocarditis.    He required vascular surgery on his right leg during that admission.  I elected to restart daptomycin and ceftaroline.  He completed 8 weeks of IV antibiotic therapy on 06/17/2019 before converting  to oral doxycycline.  He has not had any problems tolerating his antibiotic. He is improving. His pain is better but still remains about 7 out of 10.  It was 10+ out of 10 when hospitalized.  He says that the pain gets worse when he is walking.  He does not feel that he can return to work yet.  Plans to apply for disability.  Review of Systems: Review of Systems  Constitutional: Negative for fever and weight loss.  HENT: Negative for congestion and sore throat.   Respiratory: Negative for cough and shortness of breath.   Cardiovascular: Negative for chest pain.  Gastrointestinal: Negative for abdominal pain, diarrhea, nausea and vomiting.  Musculoskeletal: Positive for back pain.  Skin: Negative for rash.    Past Medical History:  Diagnosis Date  .  Depression 01/27/2015  . Diabetes mellitus without complication ([place])   . Hyperlipidemia   . Hypertension     Social History   Tobacco Use  . Smoking status: Current Every Day Smoker    Types: Cigarettes  . Smokeless tobacco: Current User  . Tobacco comment: 5 cigarrettes a day  Substance Use Topics  . Alcohol use: No    Alcohol/week: 0.0 standard drinks  . Drug use: No    No family history on file.  No Known Allergies  Objective: Vitals:   09/14/19 1529  BP: 118/83  Pulse: (!) 104  Temp: 98.1 F (36.7 C)  SpO2: 97%  Weight: 207 lb (93.9 kg)  Height: 5' 8.5" (1.74 m)   Body mass index is 31.02 kg/m.  Physical Exam Constitutional:      Comments: I interviewed him with the aid of the interpreter.    Cardiovascular:     Rate and Rhythm: Normal rate and regular rhythm.     Heart sounds: No murmur.  Pulmonary:     Effort: Pulmonary effort is normal.     Breath sounds: Normal breath sounds.  Abdominal:     Palpations: Abdomen is soft.     Tenderness: There is no abdominal tenderness.  Musculoskeletal:     Comments: He has no kyphotic deformities of the spine.  Skin:    Findings: No rash.  Neurological:     General: No focal deficit present.  Psychiatric:        Mood and Affect: Mood normal.    Sed Rate  Date Value  08/03/2019 17 mm/h (H)  07/05/2019 30 mm/hr (H)  06/21/2019 62 mm/hr (H)   CRP  Date Value  08/03/2019 3.9 mg/L  07/05/2019 <1.0 mg/dL  06/21/2019 <1.0 mg/dL   Problem List Items Addressed This Visit      High   Osteomyelitis of spine ([place])    Overall he is doing better but he has suffered an extremely severe bout of relapsing MRSA thoracic spine infection that led to T9 pathologic fracture.  His inflammatory markers are improving.  I will have him continue doxycycline, get repeat lab work today and obtain a plain x-ray of his thoracic spine.  He will follow-up here in 6 weeks.      Relevant Orders   CBC   Basic metabolic panel    C-reactive protein   Sedimentation rate      Michel Bickers, MD [facility] [phone] pager   [phone] cell 09/14/2019, 4:12 PM

## 2019-09-14 NOTE — Assessment & Plan Note (Signed)
Overall he is doing better but he has suffered an extremely severe bout of relapsing MRSA thoracic spine infection that led to T9 pathologic fracture.  His inflammatory markers are improving.  I will have him continue doxycycline, get repeat lab work today and obtain a plain x-ray of his thoracic spine.  He will follow-up here in 6 weeks.

## 2019-09-15 ENCOUNTER — Other Ambulatory Visit (HOSPITAL_COMMUNITY)
Admission: RE | Admit: 2019-09-15 | Discharge: 2019-09-15 | Disposition: A | Discharge status: Home / Self Care | Payer: Medicaid Other | Source: Ambulatory Visit | Attending: Vascular Surgery | Admitting: Vascular Surgery

## 2019-09-15 DIAGNOSIS — Z01812 Encounter for preprocedural laboratory examination: Secondary | ICD-10-CM | POA: Diagnosis not present

## 2019-09-15 DIAGNOSIS — Z20822 Contact with and (suspected) exposure to covid-19: Secondary | ICD-10-CM | POA: Insufficient documentation

## 2019-09-15 LAB — CBC
HCT: 46 % (ref 38.5–50.0)
Hemoglobin: 15.5 g/dL (ref 13.2–17.1)
MCH: 27.9 pg (ref 27.0–33.0)
MCHC: 33.7 g/dL (ref 32.0–36.0)
MCV: 82.9 fL (ref 80.0–100.0)
MPV: 11.1 fL (ref 7.5–12.5)
Platelets: 292 10*3/uL (ref 140–400)
RBC: 5.55 10*6/uL (ref 4.20–5.80)
RDW: 15.8 % — ABNORMAL HIGH (ref 11.0–15.0)
WBC: 8.3 10*3/uL (ref 3.8–10.8)

## 2019-09-15 LAB — BASIC METABOLIC PANEL
BUN/Creatinine Ratio: 24 (calc) — ABNORMAL HIGH (ref 6–22)
BUN: 26 mg/dL — ABNORMAL HIGH (ref 7–25)
CO2: 27 mmol/L (ref 20–32)
Calcium: 9.8 mg/dL (ref 8.6–10.3)
Chloride: 104 mmol/L (ref 98–110)
Creat: 1.08 mg/dL (ref 0.60–1.35)
Glucose, Bld: 210 mg/dL — ABNORMAL HIGH (ref 65–99)
Potassium: 4.8 mmol/L (ref 3.5–5.3)
Sodium: 138 mmol/L (ref 135–146)

## 2019-09-15 LAB — C-REACTIVE PROTEIN: CRP: 1.5 mg/L (ref <8.0)

## 2019-09-15 LAB — SEDIMENTATION RATE: Sed Rate: 9 mm/h (ref 0–15)

## 2019-09-15 LAB — SARS CORONAVIRUS 2 (TAT 6-24 HRS): SARS Coronavirus 2: NEGATIVE

## 2019-09-16 ENCOUNTER — Observation Stay (HOSPITAL_COMMUNITY)
Admission: RE | Admit: 2019-09-16 | Discharge: 2019-09-17 | Disposition: A | Discharge status: Home / Self Care | Payer: Medicaid Other | Attending: Vascular Surgery | Admitting: Vascular Surgery

## 2019-09-16 ENCOUNTER — Encounter (HOSPITAL_COMMUNITY)
Admission: RE | Disposition: A | Discharge status: Home / Self Care | Payer: Self-pay | Source: Home / Self Care | Attending: Vascular Surgery

## 2019-09-16 ENCOUNTER — Other Ambulatory Visit: Payer: Self-pay

## 2019-09-16 DIAGNOSIS — Z87891 Personal history of nicotine dependence: Secondary | ICD-10-CM | POA: Insufficient documentation

## 2019-09-16 DIAGNOSIS — Z794 Long term (current) use of insulin: Secondary | ICD-10-CM | POA: Diagnosis not present

## 2019-09-16 DIAGNOSIS — E1140 Type 2 diabetes mellitus with diabetic neuropathy, unspecified: Secondary | ICD-10-CM | POA: Diagnosis not present

## 2019-09-16 DIAGNOSIS — I739 Peripheral vascular disease, unspecified: Secondary | ICD-10-CM

## 2019-09-16 DIAGNOSIS — I70211 Atherosclerosis of native arteries of extremities with intermittent claudication, right leg: Principal | ICD-10-CM | POA: Insufficient documentation

## 2019-09-16 DIAGNOSIS — E1151 Type 2 diabetes mellitus with diabetic peripheral angiopathy without gangrene: Secondary | ICD-10-CM | POA: Diagnosis not present

## 2019-09-16 DIAGNOSIS — Z79899 Other long term (current) drug therapy: Secondary | ICD-10-CM | POA: Diagnosis not present

## 2019-09-16 DIAGNOSIS — E785 Hyperlipidemia, unspecified: Secondary | ICD-10-CM | POA: Diagnosis not present

## 2019-09-16 DIAGNOSIS — I70261 Atherosclerosis of native arteries of extremities with gangrene, right leg: Secondary | ICD-10-CM

## 2019-09-16 DIAGNOSIS — Z7982 Long term (current) use of aspirin: Secondary | ICD-10-CM | POA: Insufficient documentation

## 2019-09-16 DIAGNOSIS — I10 Essential (primary) hypertension: Secondary | ICD-10-CM | POA: Insufficient documentation

## 2019-09-16 DIAGNOSIS — Z7902 Long term (current) use of antithrombotics/antiplatelets: Secondary | ICD-10-CM | POA: Insufficient documentation

## 2019-09-16 DIAGNOSIS — F329 Major depressive disorder, single episode, unspecified: Secondary | ICD-10-CM | POA: Diagnosis not present

## 2019-09-16 DIAGNOSIS — Z86718 Personal history of other venous thrombosis and embolism: Secondary | ICD-10-CM | POA: Diagnosis not present

## 2019-09-16 HISTORY — PX: PERIPHERAL VASCULAR BALLOON ANGIOPLASTY: CATH118281

## 2019-09-16 HISTORY — PX: ABDOMINAL AORTOGRAM W/LOWER EXTREMITY: CATH118223

## 2019-09-16 HISTORY — DX: Peripheral vascular disease, unspecified: I73.9

## 2019-09-16 LAB — MRSA PCR SCREENING: MRSA by PCR: NEGATIVE

## 2019-09-16 LAB — GLUCOSE, CAPILLARY
Glucose-Capillary: 120 mg/dL — ABNORMAL HIGH (ref 70–99)
Glucose-Capillary: 99 mg/dL (ref 70–99)
Glucose-Capillary: 151 mg/dL — ABNORMAL HIGH (ref 70–99)

## 2019-09-16 SURGERY — ABDOMINAL AORTOGRAM W/LOWER EXTREMITY
Anesthesia: LOCAL | Laterality: Right

## 2019-09-16 MED ORDER — SODIUM CHLORIDE 0.9% FLUSH: 3.0000 mL | Freq: Two times a day (BID) | INTRAVENOUS | Status: DC

## 2019-09-16 MED ORDER — ATORVASTATIN CALCIUM 10 MG PO TABS
10.0000 mg | ORAL_TABLET | Freq: Every day | ORAL | Status: DC
  Administered 2019-09-16: 10 mg via ORAL
  Filled 2019-09-16: qty 1

## 2019-09-16 MED ORDER — LIDOCAINE HCL (PF) 1 % IJ SOLN
INTRAMUSCULAR | Status: DC | PRN
  Administered 2019-09-16: 12 mL

## 2019-09-16 MED ORDER — HEPARIN SODIUM (PORCINE) 1000 UNIT/ML IJ SOLN
INTRAMUSCULAR | Status: AC
  Filled 2019-09-16: qty 1

## 2019-09-16 MED ORDER — SODIUM CHLORIDE 0.9 % IV SOLN: INTRAVENOUS | Status: DC

## 2019-09-16 MED ORDER — SODIUM CHLORIDE 0.9 % IV SOLN: 250.0000 mL | INTRAVENOUS | Status: DC | PRN

## 2019-09-16 MED ORDER — FENTANYL CITRATE (PF) 100 MCG/2ML IJ SOLN
INTRAMUSCULAR | Status: DC | PRN
  Administered 2019-09-16: 25 ug via INTRAVENOUS

## 2019-09-16 MED ORDER — LABETALOL HCL 5 MG/ML IV SOLN
10.0000 mg | INTRAVENOUS | Status: DC | PRN
  Administered 2019-09-16: 10 mg via INTRAVENOUS

## 2019-09-16 MED ORDER — CLOPIDOGREL BISULFATE 75 MG PO TABS
75.0000 mg | ORAL_TABLET | Freq: Every day | ORAL | Status: DC
  Administered 2019-09-17: 75 mg via ORAL
  Filled 2019-09-16: qty 1

## 2019-09-16 MED ORDER — GABAPENTIN 100 MG PO CAPS
100.0000 mg | ORAL_CAPSULE | Freq: Every day | ORAL | Status: DC
  Administered 2019-09-16: 100 mg via ORAL
  Filled 2019-09-16: qty 1

## 2019-09-16 MED ORDER — IODIXANOL 320 MG/ML IV SOLN
INTRAVENOUS | Status: DC | PRN
  Administered 2019-09-16: 125 mL

## 2019-09-16 MED ORDER — ASPIRIN EC 81 MG PO TBEC
81.0000 mg | DELAYED_RELEASE_TABLET | Freq: Every day | ORAL | Status: DC
  Administered 2019-09-17: 81 mg via ORAL
  Filled 2019-09-16: qty 1

## 2019-09-16 MED ORDER — DOXYCYCLINE HYCLATE 100 MG PO TABS
100.0000 mg | ORAL_TABLET | Freq: Two times a day (BID) | ORAL | Status: DC
  Administered 2019-09-16 – 2019-09-17 (×3): 100 mg via ORAL
  Filled 2019-09-16 (×3): qty 1

## 2019-09-16 MED ORDER — HYDROCODONE-ACETAMINOPHEN 5-325 MG PO TABS
ORAL_TABLET | ORAL | Status: AC
  Filled 2019-09-16: qty 2

## 2019-09-16 MED ORDER — SODIUM CHLORIDE 0.9% FLUSH
3.0000 mL | Freq: Two times a day (BID) | INTRAVENOUS | Status: DC
  Administered 2019-09-16 (×2): 3 mL via INTRAVENOUS

## 2019-09-16 MED ORDER — ONDANSETRON HCL 4 MG/2ML IJ SOLN: 4.0000 mg | Freq: Four times a day (QID) | INTRAMUSCULAR | Status: DC | PRN

## 2019-09-16 MED ORDER — FENTANYL CITRATE (PF) 100 MCG/2ML IJ SOLN
INTRAMUSCULAR | Status: AC
  Filled 2019-09-16: qty 2

## 2019-09-16 MED ORDER — LABETALOL HCL 5 MG/ML IV SOLN
INTRAVENOUS | Status: AC
  Filled 2019-09-16: qty 4

## 2019-09-16 MED ORDER — HYDROCODONE-ACETAMINOPHEN 5-325 MG PO TABS
1.0000 | ORAL_TABLET | Freq: Four times a day (QID) | ORAL | Status: DC | PRN
  Administered 2019-09-16 – 2019-09-17 (×3): 2 via ORAL
  Filled 2019-09-16 (×2): qty 2

## 2019-09-16 MED ORDER — LIDOCAINE HCL (PF) 1 % IJ SOLN
INTRAMUSCULAR | Status: AC
  Filled 2019-09-16: qty 30

## 2019-09-16 MED ORDER — ACETAMINOPHEN 325 MG PO TABS: 650.0000 mg | ORAL_TABLET | ORAL | Status: DC | PRN

## 2019-09-16 MED ORDER — SODIUM CHLORIDE 0.9% FLUSH: 3.0000 mL | INTRAVENOUS | Status: DC | PRN

## 2019-09-16 MED ORDER — INSULIN GLARGINE 100 UNIT/ML SOLOSTAR PEN: 31.0000 [IU] | PEN_INJECTOR | Freq: Every day | SUBCUTANEOUS | Status: DC

## 2019-09-16 MED ORDER — MIDAZOLAM HCL 2 MG/2ML IJ SOLN
INTRAMUSCULAR | Status: AC
  Filled 2019-09-16: qty 2

## 2019-09-16 MED ORDER — INSULIN GLARGINE 100 UNIT/ML ~~LOC~~ SOLN
31.0000 [IU] | Freq: Every day | SUBCUTANEOUS | Status: DC
  Administered 2019-09-16: 31 [IU] via SUBCUTANEOUS
  Filled 2019-09-16 (×3): qty 0.31

## 2019-09-16 MED ORDER — HYDRALAZINE HCL 20 MG/ML IJ SOLN: 5.0000 mg | INTRAMUSCULAR | Status: DC | PRN

## 2019-09-16 MED ORDER — HEPARIN (PORCINE) IN NACL 1000-0.9 UT/500ML-% IV SOLN
INTRAVENOUS | Status: AC
  Filled 2019-09-16: qty 1000

## 2019-09-16 MED ORDER — MIDAZOLAM HCL 2 MG/2ML IJ SOLN
INTRAMUSCULAR | Status: DC | PRN
  Administered 2019-09-16: 1 mg via INTRAVENOUS

## 2019-09-16 MED ORDER — LISINOPRIL 10 MG PO TABS
20.0000 mg | ORAL_TABLET | Freq: Every day | ORAL | Status: DC
  Administered 2019-09-16 – 2019-09-17 (×2): 20 mg via ORAL
  Filled 2019-09-16 (×2): qty 2

## 2019-09-16 MED ORDER — HEPARIN SODIUM (PORCINE) 1000 UNIT/ML IJ SOLN
INTRAMUSCULAR | Status: DC | PRN
  Administered 2019-09-16: 10000 [IU] via INTRAVENOUS

## 2019-09-16 MED ORDER — HEPARIN (PORCINE) IN NACL 1000-0.9 UT/500ML-% IV SOLN
INTRAVENOUS | Status: DC | PRN
  Administered 2019-09-16 (×2): 500 mL

## 2019-09-16 SURGICAL SUPPLY — 15 items
CATH OMNI FLUSH 5F 65CM (CATHETERS) ×1 IMPLANT
CLOSURE MYNX CONTROL 6F/7F (Vascular Products) ×1 IMPLANT
DCB RANGER 7.0X40 135 (BALLOONS) IMPLANT
KIT ENCORE 26 ADVANTAGE (KITS) ×1 IMPLANT
KIT MICROPUNCTURE NIT STIFF (SHEATH) ×1 IMPLANT
KIT PV (KITS) ×2 IMPLANT
RANGER DCB 7.0X40 135 (BALLOONS) ×2
SHEATH FLEX ANSEL ST 6FR 45CM (SHEATH) ×1 IMPLANT
SHEATH PINNACLE 5F 10CM (SHEATH) ×1 IMPLANT
SHEATH PINNACLE 6F 10CM (SHEATH) ×1 IMPLANT
SYR MEDRAD MARK V 150ML (SYRINGE) ×1 IMPLANT
TRANSDUCER W/STOPCOCK (MISCELLANEOUS) ×2 IMPLANT
TRAY PV CATH (CUSTOM PROCEDURE TRAY) ×2 IMPLANT
WIRE BENTSON .035X145CM (WIRE) ×1 IMPLANT
WIRE SPARTACORE .014X300CM (WIRE) ×1 IMPLANT

## 2019-09-16 NOTE — Op Note (Signed)
Patient name: Thomas [name] MRN: [id number] DOB: 02-Feb-1976 Sex: male  09/16/2019 Pre-operative Diagnosis: Critical limb ischemia of right lower extremity with tissue loss and previous right external iliac stent as well as right above knee popliteal artery to TP trunk bypass with GSV Post-operative diagnosis:  Same Surgeon:  [name], MD Procedure Performed: 1.  Ultrasound-guided access of left common femoral artery 2.  Aortogram including catheter selection of aorta 3.  Right lower extremity arteriogram including selection of second-order branches in the right lower extremity 4.  Right external iliac artery angioplasty for in-stent restenosis (7 mm x 40 mm drug-coated Ranger) 5.  Mynx closure of the left common femoral artery 6.  46 minutes of monitored moderate conscious sedation time  Indications: Patient is a 44 year old male who was previously seen with gangrene of all 5 toes in the right lower extremity and earlier this year underwent a right external iliac artery angioplasty with stent placement as well as a right above-knee popliteal artery to TP trunk bypass with vein.  Ultimately he has healed all of his toes except for one small remaining scab on his second toe.  He was seen recently for surveillance and had some complaints of coolness in the foot as well as some increasing velocities in the TP trunk distal to the bypass.  He presents today for planned arteriogram and possible intervention.  Findings:   Aortogram showed patent aortoiliac segment except for in-stent restenosis in the right external iliac stent with about a 50% in-stent restenosis from likely intimal hyperplasia.    Runoff in the right lower extremity showed a patent common femoral, profunda, SFA, as well as a widely patent above-knee popliteal artery to TP trunk bypass.  Patient only had two-vessel runoff distal to the bypass with PT and peroneal arteries that are very small and only measures about 1.5  mm.  There did appear to be a moderate stenosis in the proximal peroneal as well as a high-grade stenosis at the takeoff of the PT but given very brisk flow into the foot with no complications at this time and healing foot wound I felt the risk of intervention exceeded benefit given very small arteries.  Ultimately the right external iliac artery in-stent restenosis was treated with drug-coated balloon with no evidence of residual stenosis and brisk flow down the right lower extremity and preserved runoff in the bypass as well as two-vessel runoff in the PT and peroneal artery.   Procedure:  The patient was identified in the holding area and taken to room 8.  The patient was then placed supine on the table and prepped and draped in the usual sterile fashion.  A time out was called.  Ultrasound was used to evaluate the left common femoral artery.  It was patent .  A digital ultrasound image was acquired.  A micropuncture needle was used to access the left common femoral artery under ultrasound guidance.  An 018 wire was advanced without resistance and a micropuncture sheath was placed.  The 018 wire was removed and a benson wire was placed.  The micropuncture sheath was exchanged for a 5 french sheath.  An omniflush catheter was advanced over the wire to the level of L-1.  An abdominal angiogram was obtained.  Next the Omni Flush catheter was used with a Bentson wire to select the right iliac and we advanced our catheter into the distal right external iliac.  We then got runoff in the right lower extremity.  Ultimately evaluating  runoff he had very brisk flow down the right lower extremity with widely patent above-knee popliteal to TP trunk bypass.  There is a stenosis in the takeoff of the PT as well as in the proximal peroneal but I felt risk of intervention exceeded benefit given very small tibials and only measure about 1.5 mm and ultimately a foot that is healing very nicely and very brisk flow down the leg.   We did elect intervene on the right external iliac in-stent restenosis.  Ultimately a Bentson wire was used to place a 6 [place] Ansell sheath in the left groin over the aortic bifurcation.  Patient was given 100 units per kilogram heparin.  I then selected a 7 mm x 40 mm drug-coated Ranger that was deployed to nominal pressure for 3 minutes across the in-stent restenosis after exchanged for an 018 wire.  Final injection showed no evidence of residual stenosis in the stent and preserved runoff down the right lower extremity.  Exchanged for a short 6 French sheath in the left groin and mynx closure was deployed and manual pressure was held for 5 minutes.  Taken to recovery in stable condition.  Plan: Patient will follow up in 1 month with right leg arterial duplex and ABIs.   Marty Heck, MD [facility] Office: [phone]

## 2019-09-16 NOTE — Progress Notes (Signed)
Stratus interpretor [id number] used

## 2019-09-16 NOTE — Progress Notes (Signed)
Pt arrived from unit from Cath lab PACU. VSS. Left Level 1 Groin incision soft.Will continue to monitor. Pt. Oriented to unit  Everlean Cherry, RN

## 2019-09-16 NOTE — Discharge Instructions (Signed)
   [facility]  Discharge Instructions  Lower Extremity Angiogram; Angioplasty/Stenting  Please refer to the following instructions for your post-procedure care. Your surgeon or physician assistant will discuss any changes with you.  Activity  Avoid lifting more than 8 pounds (1 gallons of milk) for 72 hours (3 days) after your procedure. You may walk as much as you can tolerate. It's OK to drive after 72 hours.  Bathing/Showering  You may shower the day after your procedure. If you have a bandage, you may remove it at 24- 48 hours. Clean your incision site with mild soap and water. Pat the area dry with a clean towel.  Diet  Resume your pre-procedure diet. There are no special food restrictions following this procedure. All patients with peripheral vascular disease should follow a low fat/low cholesterol diet. In order to heal from your surgery, it is CRITICAL to get adequate nutrition. Your body requires vitamins, minerals, and protein. Vegetables are the best source of vitamins and minerals. Vegetables also provide the perfect balance of protein. Processed food has little nutritional value, so try to avoid this.  Medications  Resume taking all of your medications unless your doctor tells you not to. If your incision is causing pain, you may take over-the-counter pain relievers such as acetaminophen (Tylenol)  Do NOT restart Metformin until 09/19/2019  Follow Up  Follow up will be arranged at the time of your procedure. You may have an office visit scheduled or may be scheduled for surgery. Ask your surgeon if you have any questions.  Please call us immediately for any of the following conditions: .Severe or worsening pain your legs or feet at rest or with walking. .Increased pain, redness, drainage at your groin puncture site. .Fever of 101 degrees or higher. .If you have any mild or slow bleeding from your puncture site: lie down, apply firm  constant pressure over the area with a piece of gauze or a clean wash cloth for 30 minutes- no peeking!, call 911 right away if you are still bleeding after 30 minutes, or if the bleeding is heavy and unmanageable.  Reduce your risk factors of vascular disease:  . Stop smoking. If you would like help call [facility] at 1-800-QUIT-NOW (513-351-6786) or [place] at [phone] [phone]. . Manage your cholesterol . Maintain a desired weight . Control your diabetes . Keep your blood pressure down .  If you have any questions, please call the office at [phone]

## 2019-09-16 NOTE — H&P (Signed)
History and Physical Interval Note:  09/16/2019 7:31 AM  Thomas [name]  has presented today for surgery, with the diagnosis of pad.  The various methods of treatment have been discussed with the patient and family. After consideration of risks, benefits and other options for treatment, the patient has consented to  Procedure(s): ABDOMINAL AORTOGRAM W/LOWER EXTREMITY (N/A) as a surgical intervention.  The patient's history has been reviewed, patient examined, no change in status, stable for surgery.  I have reviewed the patient's chart and labs.  Questions were answered to the patient's satisfaction.    Right leg arteriogram, possible intervention, previous right iliac stent and AK to TP trunk bypass - elevated velocity on surveillance.  Toes nearly healed.    [name]  CC: follow up  Requesting Provider: [name], PA-C  HPI: Thomas [name] is a 44 y.o. (1976-03-31) male who presents for follow up of peripheral vascular disease. He is s/p right iliac artery stenting and right above knee popliteal artery to Tibioperoneal trunk bypass with vein by Dr. Chestine Spore on 04/22/19. At time of his last visit his wounds on his right foot were healing. He says today they are all healed except 2nd toe. He occasionally has pain in his right toes. He says since his last visit in February with Dr. Chestine Spore he has had increased coldness and numbness in his right foot that keeps him awake at night. He has to put a sock on and sometimes dangling his foot off the bed improves his discomfort. He says during the day the coldness and numbing pain is improved. He does have same but tolerable feeling in the left foot. He has been trying not to take hydrocodone for pain and has been taking Ibuprofen when needed.  He has not been taking his statin or Plavix. He states he finished bottle and did not get any refills because he did not know he was suppose to continue taking the medications.  He continues to have back pain.  This pain additionally keeps him awake at night. He uses a rolling walker to get around due to pain and inability to walk far or stand for prolonged periods of time. He states he can barely ambulate due to both pain in his back and also numbness in his legs. He remains on Doxycycline since March for severe relapsing MRSA bacteremia and discitis per Infectious disease  The pt not currently on a statin for cholesterol management.  The pt is on a daily aspirin. Other AC: None  The pt is on ACE for hypertension.  The pt is diabetic. On insulin  Tobacco hx: Former smoker      Past Medical History:  Diagnosis Date  . Depression 01/27/2015  . Diabetes mellitus without complication (HCC)   . Hyperlipidemia   . Hypertension         Past Surgical History:  Procedure Laterality Date  . ABDOMINAL AORTOGRAM W/LOWER EXTREMITY Right 04/19/2019   Procedure: ABDOMINAL AORTOGRAM W/LOWER EXTREMITY; Surgeon: [name], MD; Location[facility] INVASIVE CV LAB; Service: Cardiovascular; Laterality: Right;  . FEMORAL-TIBIAL BYPASS GRAFT Right 04/22/2019   Procedure: Right FEMORAL-TIBIAL ARTERY BYPASS GRAFT with Harvest of Saphenous vein RIGHT SUPERFICIAL FEMORAL ARTERY TO TIBIOPERONEAL TRUNK BYPASS; Surgeon: [name], MD; Location: [facility] OR; Service: Vascular; Laterality: Right;  . IR CERVICAL/THORACIC DISC ASPIRATION W/IMAG GUIDE  04/15/2019  . IR FLUORO GUIDED NEEDLE PLC ASPIRATION/INJECTION LOC  04/15/2019  . kidney stones  2009  . PERIPHERAL VASCULAR INTERVENTION  04/19/2019  Procedure: PERIPHERAL VASCULAR INTERVENTION; Surgeon: Marty Heck, MD; Location: [facility] CV LAB; Service: Cardiovascular;; right external iliac  . TEE WITHOUT CARDIOVERSION N/A 04/19/2019   Procedure: TRANSESOPHAGEAL ECHOCARDIOGRAM (TEE); Surgeon: Donato Heinz, MD; Location: [facility] ENDOSCOPY; Service: Endoscopy; Laterality: N/A;   Social History        Socioeconomic History  . Marital status: Single     Spouse name: Not on file  . Number of children: Not on file  . Years of education: Not on file  . Highest education level: Not on file  Occupational History  . Not on file  Tobacco Use  . Smoking status: Former Research scientist (life sciences)  . Smokeless tobacco: Never Used  Substance and Sexual Activity  . Alcohol use: No    Alcohol/week: 0.0 standard drinks  . Drug use: No  . Sexual activity: Not on file  Other Topics Concern  . Not on file  Social History Narrative  . Not on file   Social Determinants of Health      Financial Resource Strain:   . Difficulty of Paying Living Expenses:   Food Insecurity:   . Worried About Charity fundraiser in the Last Year:   . Arboriculturist in the Last Year:   Transportation Needs:   . Film/video editor (Medical):   Marland Kitchen Lack of Transportation (Non-Medical):   Physical Activity:   . Days of Exercise per Week:   . Minutes of Exercise per Session:   Stress:   . Feeling of Stress :   Social Connections:   . Frequency of Communication with Friends and Family:   . Frequency of Social Gatherings with Friends and Family:   . Attends Religious Services:   . Active Member of Clubs or Organizations:   . Attends Archivist Meetings:   Marland Kitchen Marital Status:   Intimate Partner Violence:   . Fear of Current or Ex-Partner:   . Emotionally Abused:   Marland Kitchen Physically Abused:   . Sexually Abused:    History reviewed. No pertinent family history.        Current Outpatient Medications  Medication Sig Dispense Refill  . aspirin EC 81 MG tablet Take 1 tablet (81 mg total) by mouth daily. (Patient not taking: Reported on 06/08/2019) 150 tablet 2  . atorvastatin (LIPITOR) 10 MG tablet Take 1 tablet (10 mg total) by mouth daily at 6 PM. 30 tablet 2  . clopidogrel (PLAVIX) 75 MG tablet Take 1 tablet (75 mg total) by mouth daily with breakfast. 30 tablet 2  . doxycycline (VIBRA-TABS) 100 MG tablet Take 1 tablet (100 mg total) by mouth 2 (two) times daily. 60 tablet 3   . gabapentin (NEURONTIN) 100 MG capsule TAKE ONE CAPSULE BY MOUTH AT BEDTIME (Patient not taking: Reported on 06/21/2019) 30 capsule 0  . Heating Pads PADS Use over the counter heating pads as needed for back pain (Patient not taking: Reported on 08/03/2019) 1 Units 5  . HYDROcodone-acetaminophen (NORCO) 5-325 MG tablet Take 1-2 tablets by mouth every 6 (six) hours as needed for moderate pain. 24 tablet 0  . insulin glargine (LANTUS SOLOSTAR) 100 UNIT/ML Solostar Pen Inject 31 Units into the skin at bedtime. 15 mL 2  . Lancets MISC Use to check sugar once a day. Dx E11.9 (Patient not taking: Reported on 08/03/2019) 100 each 1  . lidocaine (LIDODERM) 5 % Place 1 patch onto the skin daily. Remove & Discard patch within 12 hours or as directed by MD (Patient  not taking: Reported on 06/21/2019) 30 patch 0  . lisinopril (ZESTRIL) 20 MG tablet Take 1 tablet (20 mg total) by mouth daily. 90 tablet 3  . metFORMIN (GLUCOPHAGE-XR) 500 MG 24 hr tablet TAKE TWO TABLETS BY MOUTH TWICE A DAY (Patient not taking: No sig reported) 120 tablet 0   No current facility-administered medications for this visit.   No Known Allergies  REVIEW OF SYSTEMS:  Review of Systems  Constitutional: Negative for chills, fever and malaise/fatigue.  Respiratory: Negative for cough and shortness of breath.  Cardiovascular: Negative for chest pain, palpitations and leg swelling.  Gastrointestinal: Negative for abdominal pain, constipation, diarrhea, nausea and vomiting.  Musculoskeletal: Positive for back pain.  Neurological: Negative for dizziness, tingling and weakness.  Endo/Heme/Allergies: Does not bruise/bleed easily.   PHYSICAL EXAMINATION:     Vitals:   09/06/19 1427  BP: 114/76  Pulse: 100  Resp: 18  Temp: (!) 97.2 F (36.2 C)  TempSrc: Temporal  SpO2: 99%  Weight: 200 lb (90.7 kg)   General: WDWN in NAD; vital signs documented above  Gait: shuffling gate, uses wheeling walker  HENT: WNL, normocephalic   Pulmonary: normal non-labored breathing , without Rales, rhonchi, wheezing  Cardiac: regular HR, without Murmurs without carotid bruit  Abdomen: soft, NT, no masses  Skin: without rashes  Vascular Exam/Pulses: right forefoot cool, duskiness of right 2nd toe with dry ulceration on the distal/plantar aspect of toe, other toes well healed. Motor and sensory intact. Left foot warm. Lower extremity incisions all well healed. Doppler PT/DP/ Pero right foot. Doppler PT/ faint pero left foot   Right Left  Radial 2+ (normal) 2+ (normal)  Ulnar 2+ (normal) 2+ (normal)  Femoral 2+ (normal) 2+ (normal)  Popliteal Not palpable Not palpable  DP Not palpable Not palpable  PT Not palpable Not palpable  Extremities:without Gangrene , without cellulitis; without open wounds;  Musculoskeletal: no muscle wasting or atrophy  Neurologic: A&O X 3; No focal weakness or paresthesias are detected  Psychiatric: The pt has Normal affect.  Non-Invasive Vascular Imaging:  Patient right above knee to tibioperoneal bypass graft with increased velocity in the outflow artery with 50-74% stenosis (PSV of 315 cm/s), however limited visualization  +-------+-----------+-----------+------------+------------+  ABI/TBIToday's ABIToday's TBIPrevious ABIPrevious TBI  +-------+-----------+-----------+------------+------------+  Right 0.95 0.65 0.70 0.52   +-------+-----------+-----------+------------+------------+  Left 0.70 0.53 0.82 0.55   +-------+-----------+-----------+------------+------------+  ASSESSMENT/PLAN:: 44 y.o. male here for follow up for peripheral vascular disease. He is s/p right iliac artery stenting and right above knee popliteal artery to Tibioperoneal trunk bypass with vein by Dr. Chestine Spore on 04/22/19. His wounds are almost completely healed. His right lower extremity is well perfused with Doppler DP/PT/pero signals. He however is essentially having rest pain at night with coldness in his right  foot. His right foot is somewhat cool to touch on examination and I am concerned with the elevated velocities seen on duplex evaluation today in the distal anastomosis. My recommendation would be for patient to have angiogram of the right lower extremity with possible intervention on the bypass graft. I discussed with him the risks/benefits of the procedure and he is agreeable to proceed. I also recommend continuing his aspirin and I have sent refills for Plavix and statin to his pharmacy  - He will be scheduled for Angiogram with right lower extremity arteriogram with Dr. Chestine Spore and next earliest availability  Graceann Congress, PA-C  [facility]  [phone]  Clinic MD: Dr. Myra Gianotti

## 2019-09-16 NOTE — Care Management (Signed)
Spoke to patient and friend who helps patient they  Had applied for medicaid,  I will check to see the progress of medicaid with financials. He will likely discharge tomorrow. He has a orange card to assist with medications and has been taking them appropriately. His community helps him and checks on him, but he does live  alone., Md can order some PT services and will see if we can get home health in for assistance. Uses a walker at home.

## 2019-09-17 DIAGNOSIS — I70211 Atherosclerosis of native arteries of extremities with intermittent claudication, right leg: Secondary | ICD-10-CM | POA: Diagnosis not present

## 2019-09-17 LAB — GLUCOSE, CAPILLARY
Glucose-Capillary: 178 mg/dL — ABNORMAL HIGH (ref 70–99)
Glucose-Capillary: 82 mg/dL (ref 70–99)

## 2019-09-17 NOTE — Progress Notes (Addendum)
  Progress Note    09/17/2019 7:13 AM 1 Day Post-Op  Subjective:  Chronic back pain o/w no complaints   Vitals:   09/16/19 2325 09/17/19 0400  BP: (!) 105/57 128/80  Pulse: 97 90  Resp: 18 14  Temp: 98.2 F (36.8 C) 97.7 F (36.5 C)  SpO2: 99% 99%    Physical Exam: Cardiac:  RRR Lungs:  nonlabored IExtremities:  Left groin soft, no hematoma. Monophasic right AT, triphasic right DP, biphasic right peroneal Doppler signals.  Biphasic left peroneal and PT Doppler signals.  Stable, improving dry ischemic changes to left toes     CBC    Component Value Date/Time   WBC 8.3 09/14/2019 1551   RBC 5.55 09/14/2019 1551   HGB 15.5 09/14/2019 1551   HCT 46.0 09/14/2019 1551   PLT 292 09/14/2019 1551   MCV 82.9 09/14/2019 1551   MCH 27.9 09/14/2019 1551   MCHC 33.7 09/14/2019 1551   RDW 15.8 (H) 09/14/2019 1551   LYMPHSABS 2.5 07/05/2019 1213   MONOABS 0.5 07/05/2019 1213   EOSABS 0.1 07/05/2019 1213   BASOSABS 0.0 07/05/2019 1213    BMET    Component Value Date/Time   NA 138 09/14/2019 1551   K 4.8 09/14/2019 1551   CL 104 09/14/2019 1551   CO2 27 09/14/2019 1551   GLUCOSE 210 (H) 09/14/2019 1551   BUN 26 (H) 09/14/2019 1551   CREATININE 1.08 09/14/2019 1551   CALCIUM 9.8 09/14/2019 1551   GFRNONAA >60 05/09/2019 1039   GFRNONAA >89 11/14/2016 1352   GFRAA >60 05/09/2019 1039   GFRAA >89 11/14/2016 1352     Intake/Output Summary (Last 24 hours) at 09/17/2019 0713 Last data filed at 09/17/2019 0600 Gross per 24 hour  Intake 963 ml  Output 800 ml  Net 163 ml    HOSPITAL MEDICATIONS Scheduled Meds: . aspirin EC  81 mg Oral Daily  . atorvastatin  10 mg Oral q1800  . clopidogrel  75 mg Oral Q breakfast  . doxycycline  100 mg Oral BID  . gabapentin  100 mg Oral QHS  . insulin glargine  31 Units Subcutaneous QHS  . lisinopril  20 mg Oral Daily  . sodium chloride flush  3 mL Intravenous Q12H   Continuous Infusions: . sodium chloride     PRN Meds:.sodium  chloride, acetaminophen, hydrALAZINE, HYDROcodone-acetaminophen, labetalol, ondansetron (ZOFRAN) IV, sodium chloride flush  Assessment:s/p    Right external iliac artery angioplasty for in-stent restenosis   Stable post-procedure  Plan: -DC home    Wendi Maya, PA-C [facility] [phone] 09/17/2019  7:13 AM   I have seen and evaluated the patient. I agree with the PA note as documented above. POD#1 s/p aortogram, right leg arteriogram, DCB of right externa iliac artery stent.  Left groin access site looks good.  Right foot with brisk PT peroneal signals.  D/C today on aspirin and plavix.  Will arrange f/u 4-6 weeks in clinic.  [name], MD [facility] [place] Office: [phone]

## 2019-09-17 NOTE — Discharge Summary (Signed)
Discharge Summary    Thomas [name] 12-Nov-1975 44 y.o. male  [id number]  Admission Date: 09/16/2019  Discharge Date: 09/17/2019 Physician: Marty Heck, MD  Admission Diagnosis: PAD (peripheral artery disease) ([place]) [I73.9]   HPI:   This is a 44 y.o. male who was previously seen with gangrene of all 5 toes in the right lower extremity and earlier this year underwent a right external iliac artery angioplasty with stent placement as well as a right above-knee popliteal artery to TP trunk bypass with vein.  Ultimately he has healed all of his toes except for one small remaining scab on his second toe.  He was seen recently for surveillance and had some complaints of coolness in the foot as well as some increasing velocities in the TP trunk distal to the bypass.  He presents today for planned arteriogram and possible intervention.   Hospital Course:  The patient was admitted to the hospital and taken to the [facility] lab on 09/16/2019 and underwent: Procedure Performed: 1.  Ultrasound-guided access of left common femoral artery 2.  Aortogram including catheter selection of aorta 3.  Right lower extremity arteriogram including selection of second-order branches in the right lower extremity 4.  Right external iliac artery angioplasty for in-stent restenosis (7 mm x 40 mm drug-coated Ranger) 5.  Mynx closure of the left common femoral artery 6.  46 minutes of monitored moderate conscious sedation time    The pt tolerated the procedure well and was transported to the PACU in excellent condition.   The patient was kept for observation overnight due to transportation issues.  His vital signs remained stable. He had no signs or symptoms of groin hematoma. No lower extremity pain.  He is voiding spontaneously.  He has bilateral doppler pedal signals and dry, stable ischemic changes of the right toes.  CBC    Component Value Date/Time   WBC 8.3 09/14/2019 1551   RBC 5.55 09/14/2019 1551   HGB 15.5 09/14/2019 1551   HCT 46.0 09/14/2019 1551   PLT 292 09/14/2019 1551   MCV 82.9 09/14/2019 1551   MCH 27.9 09/14/2019 1551   MCHC 33.7 09/14/2019 1551   RDW 15.8 (H) 09/14/2019 1551   LYMPHSABS 2.5 07/05/2019 1213   MONOABS 0.5 07/05/2019 1213   EOSABS 0.1 07/05/2019 1213   BASOSABS 0.0 07/05/2019 1213    BMET    Component Value Date/Time   NA 138 09/14/2019 1551   K 4.8 09/14/2019 1551   CL 104 09/14/2019 1551   CO2 27 09/14/2019 1551   GLUCOSE 210 (H) 09/14/2019 1551   BUN 26 (H) 09/14/2019 1551   CREATININE 1.08 09/14/2019 1551   CALCIUM 9.8 09/14/2019 1551   GFRNONAA >60 05/09/2019 1039   GFRNONAA >89 11/14/2016 1352   GFRAA >60 05/09/2019 1039   GFRAA >89 11/14/2016 1352      Discharge Instructions    Discharge patient   Complete by: As directed    Discharge disposition: 01-Home or Self Care   Discharge patient date: 09/17/2019   Nursing communication   Complete by: As directed    Please relay to pt that he is to restart his Metformin on 09/19/2019      Discharge Diagnosis:  PAD (peripheral artery disease) ([place]) [I73.9]  Secondary Diagnosis: Patient Active Problem List   Diagnosis Date Noted  . PAD (peripheral artery disease) ([place]) 06/08/2019  . Hematoma 05/09/2019  . Discitis of thoracolumbar region   . MRSA bacteremia   . Endocarditis of mitral valve   .  Rectal mass 04/14/2019  . DVT of popliteal vein (HCC) 04/14/2019  . Gangrene of toe of right foot (HCC) 04/14/2019  . 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 Diabetes mellitus type II, uncontrolled (HCC) 01/27/2015  . Diabetic neuropathy (HCC) 01/27/2015  . HTN (hypertension) 01/27/2015  . Depression 01/27/2015  . Hyperlipidemia 01/27/2015   Past Medical History:  Diagnosis Date  . Depression 01/27/2015  . Diabetes mellitus without complication (HCC)   . Hyperlipidemia   . Hypertension   . Peripheral vascular disease (HCC)      Allergies as of 09/17/2019   No Known Allergies      Medication List    TAKE these medications   aspirin EC 81 MG tablet Take 1 tablet (81 mg total) by mouth daily.   atorvastatin 10 MG tablet Commonly known as: LIPITOR Take 1 tablet (10 mg total) by mouth daily at 6 PM.   clopidogrel 75 MG tablet Commonly known as: PLAVIX Take 1 tablet (75 mg total) by mouth daily with breakfast.   doxycycline 100 MG tablet Commonly known as: VIBRA-TABS Take 1 tablet (100 mg total) by mouth 2 (two) times daily.   gabapentin 100 MG capsule Commonly known as: NEURONTIN TAKE ONE CAPSULE BY MOUTH AT BEDTIME   Heating Pads Pads Use over the counter heating pads as needed for back pain   HYDROcodone-acetaminophen 5-325 MG tablet Commonly known as: Norco 1 tab po every 8 hours as needed for pain   Lancets Misc Use to check sugar once a day.  Dx E11.9   Lantus SoloStar 100 UNIT/ML Solostar Pen Generic drug: insulin glargine Inject 31 Units into the skin at bedtime.   lidocaine 5 % Commonly known as: LIDODERM Place 1 patch onto the skin daily. Remove & Discard patch within 12 hours or as directed by MD   lisinopril 20 MG tablet Commonly known as: ZESTRIL Take 1 tablet (20 mg total) by mouth daily.   metFORMIN 500 MG 24 hr tablet Commonly known as: GLUCOPHAGE-XR TAKE TWO TABLETS BY MOUTH TWICE A DAY         Instructions:  [facility]  Discharge Instructions  Lower Extremity Angiogram; Angioplasty/Stenting  Please refer to the following instructions for your post-procedure care. Your surgeon or physician assistant will discuss any changes with you.  Activity  Avoid lifting more than 8 pounds (1 gallons of milk) for 72 hours (3 days) after your procedure. You may walk as much as you can tolerate. It's OK to drive after 72 hours.  Bathing/Showering  You may shower the day after your procedure. If you have a bandage, you may remove it at 24- 48 hours. Clean your incision site with mild soap and  water. Pat the area dry with a clean towel.  Diet  Resume your pre-procedure diet. There are no special food restrictions following this procedure. All patients with peripheral vascular disease should follow a low fat/low cholesterol diet. In order to heal from your surgery, it is CRITICAL to get adequate nutrition. Your body requires vitamins, minerals, and protein. Vegetables are the best source of vitamins and minerals. Vegetables also provide the perfect balance of protein. Processed food has little nutritional value, so try to avoid this.  Medications  Resume taking all of your medications unless your doctor tells you not to. If your incision is causing pain, you may take over-the-counter pain relievers such as acetaminophen (Tylenol)  Follow Up  Follow up will be arranged at the time of  your procedure. You may have an office visit scheduled or may be scheduled for surgery. Ask your surgeon if you have any questions.  Please call us immediately for any of the following conditions: .Severe or worsening pain your legs or feet at rest or with walking. .Increased pain, redness, drainage at your groin puncture site. .Fever of 101 degrees or higher. .If you have any mild or slow bleeding from your puncture site: lie down, apply firm constant pressure over the area with a piece of gauze or a clean wash cloth for 30 minutes- no peeking!, call 911 right away if you are still bleeding after 30 minutes, or if the bleeding is heavy and unmanageable.  Reduce your risk factors of vascular disease:  . Stop smoking. If you would like help call [facility] at 1-800-QUIT-NOW (8653717133) or [place] at (915)536-1513. . Manage your cholesterol . Maintain a desired weight . Control your diabetes . Keep your blood pressure down .  If you have any questions, please call the office at [phone]  Prescriptions given: 1. none  Disposition: home  Patient's condition: is Excellent  Follow  up: 1. Dr. Chestine Spore in 4-6 weeks with duplex and ABIs  Wendi Maya, PA-C [facility] [phone] 09/17/2019  7:40 AM

## 2019-09-17 NOTE — TOC Transition Note (Signed)
Transition of Care [facility]) - CM/SW Discharge Note   Patient Details  Name: Thomas [name] MRN: [id number] Date of Birth: 21-Oct-1975  Transition [facility]) CM/SW Contact:  Lockie Pares, RN Phone Number: 09/17/2019, 11:52 AM   Clinical Narrative:    Discussed with friend at bedside the items that patient most needing clarity on. Patient most concerned about mounting hospital bills. They applied for Medicaid with hospital liaisons help back in February, have not heard anything. Also, he lives alone, has a walker, but continues to have problems with mobility. Friends try to assist in their community. He does have a orange card and a PCP. They want to get his mother over here from [place] so she can be with him and help him.   I will call financial services to see if they can give me some information of application.  Update: I called DSS, and they stated his application is neither approved or denied. They will have intake call the patient. I told them they need interpretive services Arabic [place].  I called the patient, his friend was there with him.  He shared the information that DSS would be calling him to discuss the application.   Final next level of care: Home/Self Care Barriers to Discharge: No Barriers Identified   Patient Goals and CMS Choice Patient states their goals for this hospitalization and ongoing recovery are:: HOme but to find out about medicaid applicaiton      Discharge Placement             Home           Discharge Plan and Services  To home self care              DME Arranged: (already has walker at home)                    Social Determinants of Health (SDOH) Interventions     Readmission Risk Interventions No flowsheet data found.

## 2019-10-06 ENCOUNTER — Telehealth: Payer: Self-pay | Admitting: Medical

## 2019-10-06 MED ORDER — LANTUS SOLOSTAR 100 UNIT/ML ~~LOC~~ SOPN: 31.0000 [IU] | PEN_INJECTOR | Freq: Every day | SUBCUTANEOUS | 2 refills | Status: DC

## 2019-10-06 NOTE — Telephone Encounter (Signed)
Medication: insulin glargine (LANTUS SOLOSTAR) 100 UNIT/ML Solostar Pen    Has the patient contacted their pharmacy? No. (If no, request that the patient contact the pharmacy for the refill.) (If yes, when and what did the pharmacy advise?)  Preferred Pharmacy (with phone number or street name):  [facility] - [place], [place] - [address]  [address], [place] [address]  Phone:  [phone] Fax:  [phone]  Agent: Please be advised that RX refills may take up to 3 business days. We ask that you follow-up with your pharmacy.

## 2019-10-06 NOTE — Telephone Encounter (Signed)
Medication faxed to the health department

## 2019-10-18 ENCOUNTER — Other Ambulatory Visit: Payer: Self-pay | Admitting: Internal Medicine

## 2019-10-18 DIAGNOSIS — M4620 Osteomyelitis of vertebra, site unspecified: Secondary | ICD-10-CM

## 2019-10-19 ENCOUNTER — Telehealth: Payer: Self-pay | Admitting: Medical

## 2019-10-19 NOTE — Telephone Encounter (Signed)
Medication:HYDROcodone-acetaminophen (NORCO) 5-325 MG tablet [[id number]]    Has the patient contacted their pharmacy? No. (If no, request that the patient contact the pharmacy for the refill.) (If yes, when and what did the pharmacy advise?)  Preferred Pharmacy (with phone number or street name): [facility] [address] [place], [place] - [address]  [address], [place] [address]  Phone:  [phone] Fax:  [phone]  DEA #:  --  Agent: Please be advised that RX refills may take up to 3 business days. We ask that you follow-up with your pharmacy.

## 2019-10-19 NOTE — Telephone Encounter (Signed)
Requesting: Contract: UDS: Last Visit: Next Visit: Last Refill:  Please Advise  

## 2019-10-19 NOTE — Telephone Encounter (Addendum)
Requesting: hydrocodone Contract:06/16/19 UDS:07/06/19 Last Visit:09/09/19 Next Visit:10/27/19 Last Refill:09/09/19  Please Advise  I filled rx today.  Pt has hx of upper t spine osteomyelitis, compression fracture and spinal stenosis. Chronic pain. On norco. When hosptialized was on fenatany then was on oxycodone. I wanted pt to try norco and has been adequateley controlled.  Sending for review.  [name], PA-C

## 2019-10-20 MED ORDER — HYDROCODONE-ACETAMINOPHEN 5-325 MG PO TABS: ORAL_TABLET | ORAL | 0 refills | Status: DC

## 2019-10-20 NOTE — Addendum Note (Signed)
Addended by: Gwenevere Abbot on: 10/20/2019 11:54 AM   Modules accepted: Orders

## 2019-10-21 NOTE — Telephone Encounter (Signed)
This sounds like a great pt for pain management if he is going to need to stay on pain meds---- don't feel like you have to do pain management long term.

## 2019-10-22 NOTE — Telephone Encounter (Signed)
I think his insurance is [facility] discount/orange card. I usually have difficult time with referrals to specialist. I agree with you. If his pain accelerates will investigate who might accept him for pain management.

## 2019-10-26 ENCOUNTER — Other Ambulatory Visit: Payer: Self-pay

## 2019-10-26 ENCOUNTER — Encounter: Payer: Self-pay | Admitting: Internal Medicine

## 2019-10-26 ENCOUNTER — Ambulatory Visit (INDEPENDENT_AMBULATORY_CARE_PROVIDER_SITE_OTHER): Payer: No Typology Code available for payment source | Admitting: Internal Medicine

## 2019-10-26 DIAGNOSIS — B9562 Methicillin resistant Staphylococcus aureus infection as the cause of diseases classified elsewhere: Secondary | ICD-10-CM

## 2019-10-26 DIAGNOSIS — R7881 Bacteremia: Secondary | ICD-10-CM

## 2019-10-26 DIAGNOSIS — M4620 Osteomyelitis of vertebra, site unspecified: Secondary | ICD-10-CM

## 2019-10-26 DIAGNOSIS — M4624 Osteomyelitis of vertebra, thoracic region: Secondary | ICD-10-CM

## 2019-10-26 DIAGNOSIS — I739 Peripheral vascular disease, unspecified: Secondary | ICD-10-CM

## 2019-10-26 DIAGNOSIS — I058 Other rheumatic mitral valve diseases: Secondary | ICD-10-CM

## 2019-10-26 DIAGNOSIS — M4645 Discitis, unspecified, thoracolumbar region: Secondary | ICD-10-CM

## 2019-10-26 MED ORDER — DOXYCYCLINE HYCLATE 100 MG PO TABS: 100.0000 mg | ORAL_TABLET | Freq: Two times a day (BID) | ORAL | 11 refills | Status: DC

## 2019-10-26 NOTE — Assessment & Plan Note (Signed)
It is possible that his spine infection has been cured.  His inflammatory markers have normalized.  However his MRSA infection was so severe and persistent I recommend continuing doxycycline for now.  He is in agreement with that plan.  He will follow-up in 3 months.

## 2019-10-26 NOTE — Progress Notes (Addendum)
[facility]  Patient Active Problem List   Diagnosis Date Noted  . Discitis of thoracolumbar region     Priority: High  . MRSA bacteremia     Priority: High  . Endocarditis of mitral valve     Priority: High  . Gangrene of toe of right foot (HCC) 04/14/2019    Priority: High  . Osteomyelitis of spine (HCC) 04/13/2019    Priority: High  . PAD (peripheral artery disease) (HCC) 06/08/2019  . Hematoma 05/09/2019  . Rectal mass 04/14/2019  . DVT of popliteal vein (HCC) 04/14/2019  . Diabetes mellitus type II, uncontrolled (HCC) 01/27/2015  . Diabetic neuropathy (HCC) 01/27/2015  . HTN (hypertension) 01/27/2015  . Depression 01/27/2015  . Hyperlipidemia 01/27/2015    Patient's Medications  New Prescriptions   No medications on file  Previous Medications   ASPIRIN EC 81 MG TABLET    Take 1 tablet (81 mg total) by mouth daily.   ATORVASTATIN (LIPITOR) 10 MG TABLET    Take 1 tablet (10 mg total) by mouth daily at 6 PM.   CLOPIDOGREL (PLAVIX) 75 MG TABLET    Take 1 tablet (75 mg total) by mouth daily with breakfast.   GABAPENTIN (NEURONTIN) 100 MG CAPSULE    TAKE ONE CAPSULE BY MOUTH AT BEDTIME   HEATING PADS PADS    Use over the counter heating pads as needed for back pain   HYDROCODONE-ACETAMINOPHEN (NORCO) 5-325 MG TABLET    1 tab po every 8 hours as needed for pain   INSULIN GLARGINE (LANTUS SOLOSTAR) 100 UNIT/ML SOLOSTAR PEN    Inject 31 Units into the skin at bedtime.   LANCETS MISC    Use to check sugar once a day.  Dx E11.9   LIDOCAINE (LIDODERM) 5 %    Place 1 patch onto the skin daily. Remove & Discard patch within 12 hours or as directed by MD   LISINOPRIL (ZESTRIL) 20 MG TABLET    Take 1 tablet (20 mg total) by mouth daily.   METFORMIN (GLUCOPHAGE-XR) 500 MG 24 HR TABLET    TAKE TWO TABLETS BY MOUTH TWICE A DAY  Modified Medications   Modified Medication Previous Medication   DOXYCYCLINE (VIBRA-TABS) 100 MG TABLET doxycycline  (VIBRA-TABS) 100 MG tablet      Take 1 tablet (100 mg total) by mouth 2 (two) times daily.    TAKE ONE TABLET BY MOUTH TWICE A DAY  Discontinued Medications   No medications on file    Subjective: Latoya is in for his routine follow-up visit. He was hospitalized on 01/31/2019 with MRSA bacteremia complicated by lower thoracic spine infection and mitral valve endocarditis.  He had persistent bacteremia for 2 weeks and was switched from vancomycin to daptomycin and ceftaroline.  He was discharged on daptomycin alone, completing therapy on 04/04/2019.    He started to feel worse shortly after completing antibiotic therapy and noted some worrisome changes in the toes of his right foot leading to readmission here on 04/14/2019.  Both blood cultures from 04/13/2019 and 1 of 2 from 04/14/2019 have grown MRSA.  MRI showed progressive infection at the T9 level with new discitis at T8 and T10.  MRI of his right foot showed no definite osteomyelitis or abscess.  He underwent TEE which did not show any vegetations or other evidence of persistent endocarditis.    He required vascular surgery on his right leg during that admission.  I elected  to restart daptomycin and ceftaroline.  He completed 8 weeks of IV antibiotic therapy on 06/17/2019 before converting to oral doxycycline.  He has not had any problems tolerating his antibiotic. He is improving. His pain is better but still remains about 7 out of 10.  It was 10+ out of 10 when hospitalized.  He says that the pain gets worse when he is walking.  He says that he has tried the back brace he was given in the hospital on several occasions but feels that that makes the pain worse.  He does not feel that he can return to work yet.  He has applied or disability but has not heard anything yet.  Review of Systems: Review of Systems  Constitutional: Negative for fever and weight loss.  HENT: Negative for congestion and sore throat.   Respiratory: Negative for cough and  shortness of breath.   Cardiovascular: Negative for chest pain.  Gastrointestinal: Negative for abdominal pain, diarrhea, nausea and vomiting.  Musculoskeletal: Positive for back pain.  Skin: Negative for rash.    Past Medical History:  Diagnosis Date  . Depression 01/27/2015  . Diabetes mellitus without complication (HCC)   . Hyperlipidemia   . Hypertension   . Peripheral vascular disease (HCC)     Social History   Tobacco Use  . Smoking status: Former Smoker    Types: Cigarettes  . Smokeless tobacco: Former Neurosurgeon  . Tobacco comment: 5 cigarrettes a day  Vaping Use  . Vaping Use: Never used  Substance Use Topics  . Alcohol use: No    Alcohol/week: 0.0 standard drinks  . Drug use: No    No family history on file.  No Known Allergies  Objective: Vitals:   10/26/19 1431  BP: 133/87  Pulse: 96  SpO2: 100%  Weight: 215 lb (97.5 kg)   Body mass index is 32.22 kg/m.  Physical Exam Constitutional:      Comments: I interviewed him with the aid of the interpreter.    Cardiovascular:     Rate and Rhythm: Normal rate and regular rhythm.     Heart sounds: No murmur heard.   Pulmonary:     Effort: Pulmonary effort is normal.     Breath sounds: Normal breath sounds.  Abdominal:     Palpations: Abdomen is soft.     Tenderness: There is no abdominal tenderness.  Musculoskeletal:     Comments: He has no kyphotic deformities of the spine.  Skin:    Findings: No rash.  Neurological:     General: No focal deficit present.  Psychiatric:        Mood and Affect: Mood normal.   Thoracic spine x-ray 09/14/2019 IMPRESSION: Changes of previous discitis osteomyelitis centered at T9 with marked collapse at T9. Lucency in the inferior aspect of the T8 vertebral body and more subtly in the superior aspect of the T10 vertebral body corresponds with abnormality noted on previous MR. No new fracture or new obvious bony involvement. No soft tissue extension of infection noted.  Other vertebral body levels appear Unremarkable.  Sed Rate  Date Value  09/14/2019 9 mm/h  08/03/2019 17 mm/h (H)  07/05/2019 30 mm/hr (H)   CRP  Date Value  09/14/2019 1.5 mg/L  08/03/2019 3.9 mg/L  07/05/2019 <1.0 mg/dL   Problem List Items Addressed This Visit      High   Osteomyelitis of spine (HCC)    It is possible that his spine infection has been cured.  His inflammatory  markers have normalized.  However his MRSA infection was so severe and persistent I recommend continuing doxycycline for now.  He is in agreement with that plan.  He will follow-up in 3 months.      Relevant Medications   doxycycline (VIBRA-TABS) 100 MG tablet      Cliffton Asters, MD [facility] [phone] pager   [phone] cell 01/25/2020, 11:05 AM

## 2019-10-27 ENCOUNTER — Encounter: Payer: Self-pay | Admitting: Medical

## 2019-10-27 ENCOUNTER — Ambulatory Visit (INDEPENDENT_AMBULATORY_CARE_PROVIDER_SITE_OTHER): Payer: No Typology Code available for payment source | Admitting: Medical

## 2019-10-27 VITALS — BP 140/90 | HR 98 | Temp 98.2°F | Ht 68.0 in | Wt 218.0 lb

## 2019-10-27 DIAGNOSIS — I10 Essential (primary) hypertension: Secondary | ICD-10-CM

## 2019-10-27 DIAGNOSIS — E119 Type 2 diabetes mellitus without complications: Secondary | ICD-10-CM

## 2019-10-27 DIAGNOSIS — I739 Peripheral vascular disease, unspecified: Secondary | ICD-10-CM

## 2019-10-27 DIAGNOSIS — M546 Pain in thoracic spine: Secondary | ICD-10-CM

## 2019-10-27 LAB — COMPREHENSIVE METABOLIC PANEL
ALT: 27 U/L (ref 0–53)
AST: 19 U/L (ref 0–37)
Albumin: 4.3 g/dL (ref 3.5–5.2)
Alkaline Phosphatase: 103 U/L (ref 39–117)
BUN: 25 mg/dL — ABNORMAL HIGH (ref 6–23)
CO2: 27 mEq/L (ref 19–32)
Calcium: 9.7 mg/dL (ref 8.4–10.5)
Chloride: 103 mEq/L (ref 96–112)
Creatinine, Ser: 0.98 mg/dL (ref 0.40–1.50)
GFR: 83.48 mL/min (ref >60.00)
Glucose, Bld: 141 mg/dL — ABNORMAL HIGH (ref 70–99)
Potassium: 4.6 mEq/L (ref 3.5–5.1)
Sodium: 136 mEq/L (ref 135–145)
Total Bilirubin: 0.4 mg/dL (ref 0.2–1.2)
Total Protein: 7.2 g/dL (ref 6.0–8.3)

## 2019-10-27 LAB — HEMOGLOBIN A1C: Hgb A1c MFr Bld: 6.7 % — ABNORMAL HIGH (ref 4.6–6.5)

## 2019-10-27 NOTE — Progress Notes (Signed)
Subjective:    Patient ID: Thomas [name], male    DOB: 05-16-1975, 44 y.o.   MRN: [id number]  HPI  Pt in for follow up.  Pt has hx of osteomyelitis. Pt saw ID MD recently.  Hpi from ID.  Subjective: Thomas [name] is in for his routine follow-up visit. He was hospitalized on 01/31/2019 with MRSA bacteremia complicated by lower thoracic spine infection and mitral valve endocarditis. He had persistent bacteremia for 2 weeks and was switched from vancomycin to daptomycin and ceftaroline.  He was discharged on daptomycin alone, completing therapy on 04/04/2019.   He started to feel worse shortly after completing antibiotic therapy and noted some worrisome changes in the toes of his right foot leading to readmission here on 04/14/2019. Both blood cultures from 04/13/2019 and 1 of 2 from 04/14/2019 have grown MRSA. MRI showed progressive infection at the T9 level with new discitis at T8 and T10. MRI of his right foot showed no definite osteomyelitis or abscess. He underwent TEE which did not show any vegetations or other evidence of persistent endocarditis.   He required vascular surgery on his right leg during that admission.  I elected to restart daptomycin and ceftaroline.  He completed 8 weeks of IV antibiotic therapy on 06/17/2019 before converting to oral doxycycline.  He has not had any problems tolerating his antibiotic. He is improving. His pain is better but still remains about 7 out of 10.  It was 10+ out of 10 when hospitalized.  He says that the pain gets worse when he is walking.  He says that he has tried the back brace he was given in the hospital on several occasions but feels that that makes the pain worse.  He does not feel that he can return to work yet.  He has applied or disability but has not heard anything yet.  Pt pain in his back from spine infection and discitis. I placed on norco as he was formerly on oxycodone. With  norco use pain is controlled. Pt has updated uds and  controlled med contract.   Pt is diabetic last a1c was 6.9. High but better than before.  Pt stopped smoking one week.   Pt has high bp. He did not take his bp med today.   Review of Systems  Constitutional: Negative for chills, fatigue and fever.  HENT: Negative for congestion.   Respiratory: Negative for cough, chest tightness, shortness of breath and wheezing.   Cardiovascular: Negative for chest pain and palpitations.  Gastrointestinal: Negative for abdominal pain, diarrhea and rectal pain.  Musculoskeletal: Positive for back pain.  Skin: Negative for rash.  Neurological: Negative for dizziness and headaches.  Hematological: Negative for adenopathy. Does not bruise/bleed easily.  Psychiatric/Behavioral: Negative for agitation and confusion. The patient is not nervous/anxious.     Past Medical History:  Diagnosis Date  . Depression 01/27/2015  . Diabetes mellitus without complication (HCC)   . Hyperlipidemia   . Hypertension   . Peripheral vascular disease (HCC)      Social History   Socioeconomic History  . Marital status: Single    Spouse name: Not on file  . Number of children: Not on file  . Years of education: Not on file  . Highest education level: Not on file  Occupational History  . Not on file  Tobacco Use  . Smoking status: Former Smoker    Types: Cigarettes  . Smokeless tobacco: Former Neurosurgeon  . Tobacco comment: 5 cigarrettes a day  Vaping  Use  . Vaping Use: Never used  Substance and Sexual Activity  . Alcohol use: No    Alcohol/week: 0.0 standard drinks  . Drug use: No  . Sexual activity: Not on file  Other Topics Concern  . Not on file  Social History Narrative  . Not on file   Social Determinants of Health   Financial Resource Strain:   . Difficulty of Paying Living Expenses:   Food Insecurity:   . Worried About Programme researcher, broadcasting/film/video in the Last Year:   . Barista in the Last Year:   Transportation Needs:   . Freight forwarder  (Medical):   Marland Kitchen Lack of Transportation (Non-Medical):   Physical Activity:   . Days of Exercise per Week:   . Minutes of Exercise per Session:   Stress:   . Feeling of Stress :   Social Connections:   . Frequency of Communication with Friends and Family:   . Frequency of Social Gatherings with Friends and Family:   . Attends Religious Services:   . Active Member of Clubs or Organizations:   . Attends Banker Meetings:   Marland Kitchen Marital Status:   Intimate Partner Violence:   . Fear of Current or Ex-Partner:   . Emotionally Abused:   Marland Kitchen Physically Abused:   . Sexually Abused:     Past Surgical History:  Procedure Laterality Date  . ABDOMINAL AORTOGRAM W/LOWER EXTREMITY Right 04/19/2019   Procedure: ABDOMINAL AORTOGRAM W/LOWER EXTREMITY;  Surgeon: [name], MD;  Location[facility] INVASIVE CV LAB;  Service: Cardiovascular;  Laterality: Right;  . ABDOMINAL AORTOGRAM W/LOWER EXTREMITY Right 09/16/2019   Procedure: ABDOMINAL AORTOGRAM W/LOWER EXTREMITY;  Surgeon: [name], MD;  Location: [facility] INVASIVE CV LAB;  Service: Cardiovascular;  Laterality: Right;  . FEMORAL-TIBIAL BYPASS GRAFT Right 04/22/2019   Procedure: Right  FEMORAL-TIBIAL ARTERY BYPASS GRAFT with Harvest of Saphenous vein RIGHT SUPERFICIAL FEMORAL ARTERY TO TIBIOPERONEAL TRUNK BYPASS;  Surgeon: [name], MD;  Location: [facility] OR;  Service: Vascular;  Laterality: Right;  . IR CERVICAL/THORACIC DISC ASPIRATION W/IMAG GUIDE  04/15/2019  . IR FLUORO GUIDED NEEDLE PLC ASPIRATION/INJECTION LOC  04/15/2019  . kidney stones  2009  . PERIPHERAL VASCULAR BALLOON ANGIOPLASTY Right 09/16/2019   Procedure: PERIPHERAL VASCULAR BALLOON ANGIOPLASTY;  Surgeon: [name], MD;  Location: [facility] INVASIVE CV LAB;  Service: Cardiovascular;  Laterality: Right;  ext iliac DCB  . PERIPHERAL VASCULAR INTERVENTION  04/19/2019   Procedure: PERIPHERAL VASCULAR INTERVENTION;  Surgeon: [name], MD;  Location: [facility]  INVASIVE CV LAB;  Service: Cardiovascular;;  right external iliac  . TEE WITHOUT CARDIOVERSION N/A 04/19/2019   Procedure: TRANSESOPHAGEAL ECHOCARDIOGRAM (TEE);  Surgeon: [name], MD;  Location[facility] ENDOSCOPY;  Service: Endoscopy;  Laterality: N/A;    No family history on file.  No Known Allergies  Current Outpatient Medications on File Prior to Visit  Medication Sig Dispense Refill  . aspirin EC 81 MG tablet Take 1 tablet (81 mg total) by mouth daily. 150 tablet 2  . atorvastatin (LIPITOR) 10 MG tablet Take 1 tablet (10 mg total) by mouth daily at 6 PM. 30 tablet 2  . clopidogrel (PLAVIX) 75 MG tablet Take 1 tablet (75 mg total) by mouth daily with breakfast. 30 tablet 2  . doxycycline (VIBRA-TABS) 100 MG tablet Take 1 tablet (100 mg total) by mouth 2 (two) times daily. 60 tablet 11  . gabapentin (NEURONTIN) 100 MG capsule TAKE ONE CAPSULE BY  MOUTH AT BEDTIME 30 capsule 0  . Heating Pads PADS Use over the counter heating pads as needed for back pain 1 Units 5  . HYDROcodone-acetaminophen (NORCO) 5-325 MG tablet 1 tab po every 8 hours as needed for pain 90 tablet 0  . insulin glargine (LANTUS SOLOSTAR) 100 UNIT/ML Solostar Pen Inject 31 Units into the skin at bedtime. 15 mL 2  . Lancets MISC Use to check sugar once a day.  Dx E11.9 100 each 1  . lidocaine (LIDODERM) 5 % Place 1 patch onto the skin daily. Remove & Discard patch within 12 hours or as directed by MD 30 patch 0  . lisinopril (ZESTRIL) 20 MG tablet Take 1 tablet (20 mg total) by mouth daily. 90 tablet 3  . metFORMIN (GLUCOPHAGE-XR) 500 MG 24 hr tablet TAKE TWO TABLETS BY MOUTH TWICE A DAY 120 tablet 0   No current facility-administered medications on file prior to visit.    BP 140/90   Pulse 98   Temp 98.2 F (36.8 C) (Oral)   Ht 5\' 8"  (1.727 m)   Wt 218 lb (98.9 kg)   SpO2 98%   BMI 33.15 kg/m       Objective:   Physical Exam  General Mental Status- Alert. General Appearance- Not in acute distress.    Skin General: Color- Normal Color. Moisture- Normal Moisture.  Neck Carotid Arteries- Normal color. Moisture- Normal Moisture. No carotid bruits. No JVD.  Chest and Lung Exam Auscultation: Breath Sounds:-Normal.  Cardiovascular Auscultation:Rythm- Regular. Murmurs & Other Heart Sounds:Auscultation of the heart reveals- No Murmurs.  Abdomen Inspection:-Inspeection Normal. Palpation/Percussion:Note:No mass. Palpation and Percussion of the abdomen reveal- Non Tender, Non Distended + BS, no rebound or guarding.  Neurologic Cranial Nerve exam:- CN III-XII intact(No nystagmus), symmetric smile. Strength:- 5/5 equal and symmetric strength both upper and lower extremities.      Assessment & Plan:  Bp elevated today. Better when I checked. Did not take med today so expect is better. Please take med daily even if getting fasting labs.  For diabetes, will get a1c, cmp and check lipid panel.  I am glad your rt lower ext wound has healed after succesful  vascular surgery.  For chronic back pain continue norco. Keep following up with ID MD regarding chronic osteomyelitis. If you get any fever, chills, severe sweats or lethargy notify .  Follow up 3 months or as needed  Time spent with patient today was  30 minutes which consisted of chart review, discussing diagnosis, work up, treatment and documentation.

## 2019-10-27 NOTE — Patient Instructions (Signed)
Bp elevated today. Better when I checked. Did not take med today so expect is better. Please take med daily even if getting fasting labs.  For diabetes, will get a1c, cmp and check lipid panel.  I am glad your rt lower ext wound has healed after succesful  vascular surgery.  For chronic back pain continue norco. Keep following up with ID MD regarding chronic osteomyelitis. If you get any fever, chills, severe sweats or lethargy notify us.  Follow up 3 months or as needed

## 2019-11-02 ENCOUNTER — Ambulatory Visit (INDEPENDENT_AMBULATORY_CARE_PROVIDER_SITE_OTHER)
Admit: 2019-11-02 | Discharge: 2019-11-02 | Disposition: A | Discharge status: Home / Self Care | Payer: Medicaid Other | Attending: Vascular Surgery | Admitting: Vascular Surgery

## 2019-11-02 ENCOUNTER — Ambulatory Visit (INDEPENDENT_AMBULATORY_CARE_PROVIDER_SITE_OTHER): Payer: No Typology Code available for payment source | Admitting: Physician Assistant

## 2019-11-02 ENCOUNTER — Other Ambulatory Visit: Payer: Self-pay

## 2019-11-02 ENCOUNTER — Ambulatory Visit (HOSPITAL_COMMUNITY)
Admission: RE | Admit: 2019-11-02 | Discharge: 2019-11-02 | Disposition: A | Discharge status: Home / Self Care | Payer: Medicaid Other | Source: Ambulatory Visit | Attending: Vascular Surgery | Admitting: Vascular Surgery

## 2019-11-02 VITALS — BP 144/99 | HR 95 | Temp 97.3°F | Resp 20 | Ht 68.0 in | Wt 217.2 lb

## 2019-11-02 DIAGNOSIS — I739 Peripheral vascular disease, unspecified: Secondary | ICD-10-CM

## 2019-11-02 NOTE — Progress Notes (Signed)
HISTORY AND PHYSICAL     CC:  follow up. Requesting Provider:  [name], PA-C  HPI: This is a 44 y.o. male who is here today for follow up for  1.Ultrasound-guided access of left common femoral artery 2.Aortogram including catheter selection of aorta 3.Right lower extremity arteriogram including selection of second-order branches in the right lower extremity 4.Right external iliac artery angioplasty for in-stent restenosis (7 mm x 40 mm drug-coated Ranger) 5.Mynx closure of the left common femoral artery 6.46 minutes of monitored moderate conscious sedationtime  With hx of right AK popliteal to TP trunk bypass with reversed ipsilateral GSV on 04/22/2019 both by Dr. Chestine Spore.   Pt was last seen 09/17/2019 at the hospital and at that time, his left groin access looked good and his right PT and peroneal had brisk doppler signals.  He was discharged home on Plavix and asa and scheduled for f/u in 4-6 weeks.    Pt has hx of chronic back pain and takes Norco for this.  He has diabetes, which he states is under good control.  PCP is continuing to monitor his BP.  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   The pt returns today for that visit and the interpreter via pad is used. He states he continues to have numbness in the right foot as well as some swelling.  He states that his foot feels cold at night.  He states he did have some swelling in the left thigh but that has gotten better.  He states that he saw the heart doctor on 7/13 and they felt he was a little bit better per pt.    The pt is on a statin for cholesterol management.    The pt is on an aspirin.    Other AC:  Plavix The pt is on ACEI for hypertension.  The pt does have diabetes. Tobacco hx:  former  Pt does not have family hx of AAA.  Past Medical History:  Diagnosis Date  . Depression 01/27/2015  . Diabetes mellitus without  complication (HCC)   . Hyperlipidemia   . Hypertension   . Peripheral vascular disease [facility])     Past Surgical History:  Procedure Laterality Date  . ABDOMINAL AORTOGRAM W/LOWER EXTREMITY Right 04/19/2019   Procedure: ABDOMINAL AORTOGRAM W/LOWER EXTREMITY;  Surgeon: [name], MD;  Location: [place] INVASIVE CV LAB;  Service: Cardiovascular;  Laterality: Right;  . ABDOMINAL AORTOGRAM W/LOWER EXTREMITY Right 09/16/2019   Procedure: ABDOMINAL AORTOGRAM W/LOWER EXTREMITY;  Surgeon: [name], MD;  Location: [facility] INVASIVE CV LAB;  Service: Cardiovascular;  Laterality: Right;  . FEMORAL-TIBIAL BYPASS GRAFT Right 04/22/2019   Procedure: Right  FEMORAL-TIBIAL ARTERY BYPASS GRAFT with Harvest of Saphenous vein RIGHT SUPERFICIAL FEMORAL ARTERY TO TIBIOPERONEAL TRUNK BYPASS;  Surgeon: [name], MD;  Location: [facility] OR;  Service: Vascular;  Laterality: Right;  . IR CERVICAL/THORACIC DISC ASPIRATION W/IMAG GUIDE  04/15/2019  . IR FLUORO GUIDED NEEDLE PLC ASPIRATION/INJECTION LOC  04/15/2019  . kidney stones  2009  . PERIPHERAL VASCULAR BALLOON ANGIOPLASTY Right 09/16/2019   Procedure: PERIPHERAL VASCULAR BALLOON ANGIOPLASTY;  Surgeon: [name], MD;  Location: [facility] INVASIVE CV LAB;  Service: Cardiovascular;  Laterality: Right;  ext iliac DCB  . PERIPHERAL VASCULAR INTERVENTION  04/19/2019   Procedure: PERIPHERAL VASCULAR INTERVENTION;  Surgeon: [name], MD;  Location: [facility] INVASIVE CV LAB;  Service: Cardiovascular;;  right external  iliac  . TEE WITHOUT CARDIOVERSION N/A 04/19/2019   Procedure: TRANSESOPHAGEAL ECHOCARDIOGRAM (TEE);  Surgeon: [name], MD;  Location: [facility] ENDOSCOPY;  Service: Endoscopy;  Laterality: N/A;    No Known Allergies  Current Outpatient Medications  Medication Sig Dispense Refill  . aspirin EC 81 MG tablet Take 1 tablet (81 mg total) by mouth daily. 150 tablet 2  . atorvastatin (LIPITOR) 10 MG tablet Take 1 tablet (10 mg total) by  mouth daily at 6 PM. 30 tablet 2  . clopidogrel (PLAVIX) 75 MG tablet Take 1 tablet (75 mg total) by mouth daily with breakfast. 30 tablet 2  . doxycycline (VIBRA-TABS) 100 MG tablet Take 1 tablet (100 mg total) by mouth 2 (two) times daily. 60 tablet 11  . gabapentin (NEURONTIN) 100 MG capsule TAKE ONE CAPSULE BY MOUTH AT BEDTIME 30 capsule 0  . Heating Pads PADS Use over the counter heating pads as needed for back pain 1 Units 5  . HYDROcodone-acetaminophen (NORCO) 5-325 MG tablet 1 tab po every 8 hours as needed for pain 90 tablet 0  . insulin glargine (LANTUS SOLOSTAR) 100 UNIT/ML Solostar Pen Inject 31 Units into the skin at bedtime. 15 mL 2  . Lancets MISC Use to check sugar once a day.  Dx E11.9 100 each 1  . lidocaine (LIDODERM) 5 % Place 1 patch onto the skin daily. Remove & Discard patch within 12 hours or as directed by MD 30 patch 0  . lisinopril (ZESTRIL) 20 MG tablet Take 1 tablet (20 mg total) by mouth daily. 90 tablet 3  . metFORMIN (GLUCOPHAGE-XR) 500 MG 24 hr tablet TAKE TWO TABLETS BY MOUTH TWICE A DAY 120 tablet 0   No current facility-administered medications for this visit.    No family history on file.  Social History   Socioeconomic History  . Marital status: Single    Spouse name: Not on file  . Number of children: Not on file  . Years of education: Not on file  . Highest education level: Not on file  Occupational History  . Not on file  Tobacco Use  . Smoking status: Former Smoker    Types: Cigarettes  . Smokeless tobacco: Former Neurosurgeon  . Tobacco comment: 5 cigarrettes a day  Vaping Use  . Vaping Use: Never used  Substance and Sexual Activity  . Alcohol use: No    Alcohol/week: 0.0 standard drinks  . Drug use: No  . Sexual activity: Not on file  Other Topics Concern  . Not on file  Social History Narrative  . Not on file   Social Determinants of Health   Financial Resource Strain:   . Difficulty of Paying Living Expenses:   Food Insecurity:     . Worried About Programme researcher, broadcasting/film/video in the Last Year:   . Barista in the Last Year:   Transportation Needs:   . Freight forwarder (Medical):   Marland Kitchen Lack of Transportation (Non-Medical):   Physical Activity:   . Days of Exercise per Week:   . Minutes of Exercise per Session:   Stress:   . Feeling of Stress :   Social Connections:   . Frequency of Communication with Friends and Family:   . Frequency of Social Gatherings with Friends and Family:   . Attends Religious Services:   . Active Member of Clubs or Organizations:   . Attends Banker Meetings:   Marland Kitchen Marital Status:   Intimate Partner Violence:   .  Fear of Current or Ex-Partner:   . Emotionally Abused:   Marland Kitchen Physically Abused:   . Sexually Abused:      REVIEW OF SYSTEMS:   [X]  denotes positive finding, [ ]  denotes negative finding Cardiac  Comments:  Chest pain or chest pressure:    Shortness of breath upon exertion:    Short of breath when lying flat:    Irregular heart rhythm:        Vascular    Pain in calf, thigh, or hip brought on by ambulation:    Pain in feet at night that wakes you up from your sleep:     Blood clot in your veins:    Leg swelling:  x Right leg      Pulmonary    Oxygen at home:    Productive cough:     Wheezing:         Neurologic    Sudden weakness in arms or legs:     Numbness right foot x See HPI  Sudden onset of difficulty speaking or slurred speech:    Temporary loss of vision in one eye:     Problems with dizziness:         Gastrointestinal    Blood in stool:     Vomited blood:         Genitourinary    Burning when urinating:     Blood in urine:        Psychiatric    Major depression:         Hematologic    Bleeding problems:    Problems with blood clotting too easily:        Skin    Rashes or ulcers:        Constitutional    Fever or chills:      PHYSICAL EXAMINATION:  Today's Vitals   11/02/19 1045  BP: (!) 144/99  Pulse: 95  Resp:  20  Temp: (!) 97.3 F (36.3 C)  TempSrc: Temporal  SpO2: 99%  Weight: 217 lb 3.2 oz (98.5 kg)  Height: 5\' 8"  (1.727 m)   Body mass index is 33.03 kg/m.  General:  WDWN in NAD; vital signs documented above Gait: Not observed HENT: WNL, normocephalic Pulmonary: normal non-labored breathing , without wheezing Cardiac: regular HR, without  Murmur; without carotid bruits Abdomen: soft, NT, no masses Skin: without rashes Vascular Exam/Pulses:  Right Left  Radial 2+ (normal) 2+ (normal)  DP Brisk doppler absesnt  PT Brisk doppler Brisk doppler  Peroneal Brisk doppler Brisk doppler   Extremities: without ischemic changes, without Gangrene , without cellulitis; without open wounds    Musculoskeletal: no muscle wasting or atrophy  Neurologic: A&O X 3;  No focal weakness or paresthesias are detected Psychiatric:  The pt has Normal affect.   Non-Invasive Vascular Imaging:   ABI's/TBI's on 11/02/2019: Right:  0.90/0.69 - Great toe pressure: 103 Left:  0.64/0.58 - Great toe pressure: 87  Arterial duplex on 11/02/2019: +--------+--------+-----+--------+--------+--------+  RIGHT  PSV cm/sRatioStenosisWaveformComments  +--------+--------+-----+--------+--------+--------+  EIA Prox212                    +--------+--------+-----+--------+--------+--------+  EIA Mid 124                    +--------+--------+-----+--------+--------+--------+       Right Graft #1:  +------------------+--------+--------+---------+--------+           PSV cm/sStenosisWaveform Comments  +------------------+--------+--------+---------+--------+  Inflow      129  triphasic      +------------------+--------+--------+---------+--------+  Prox Anastomosis 64       biphasic       +------------------+--------+--------+---------+--------+  Proximal Graft  56       biphasic         +------------------+--------+--------+---------+--------+  Mid Graft     53       biphasic       +------------------+--------+--------+---------+--------+  Distal Graft   63       biphasic       +------------------+--------+--------+---------+--------+  Distal Anastomosis72       biphasic       +------------------+--------+--------+---------+--------+  Outflow      77       biphasic       +------------------+--------+--------+---------+--------+   Summary:  Right: Widely patent above knee femoral to tibial artery bypass graft  without evidence of stenosis.  The imaged portions of the external iliac artery are patent without  evidence of stenosis. The stent is not well visualized.    Previous ABI's/TBI's on 09/06/2019: Right:  0.95/0.65 - Great toe pressure: 85 Left:  0.70/0.53 - Great toe pressure:  70  Previous arterial duplex on 09/06/2019: Right Graft #1:  +------------------+--------+---------------+----------+--------+           PSV cm/sStenosis    Waveform Comments  +------------------+--------+---------------+----------+--------+  Inflow      102           biphasic       +------------------+--------+---------------+----------+--------+  Prox Anastomosis 89           biphasic       +------------------+--------+---------------+----------+--------+  Proximal Graft  60           monophasic      +------------------+--------+---------------+----------+--------+  Mid Graft     49           monophasic      +------------------+--------+---------------+----------+--------+  Distal Graft   69           monophasicbrisk    +------------------+--------+---------------+----------+--------+  Distal Anastomosis50           biphasic        +------------------+--------+---------------+----------+--------+  Outflow      315   50-74% stenosismonophasicbrisk     Summary:  Right: Patent above knee to tibioperoneal bypass graft with increased  velociy in the outflow artery 50 - 74% stenosis, limited visualiztion.  ASSESSMENT/PLAN:: 44 y.o. male here for follow up for  1.Ultrasound-guided access of left common femoral artery 2.Aortogram including catheter selection of aorta 3.Right lower extremity arteriogram including selection of second-order branches in the right lower extremity 4.Right external iliac artery angioplasty for in-stent restenosis (7 mm x 40 mm drug-coated Ranger) 5.Mynx closure of the left common femoral artery 6.46 minutes of monitored moderate conscious sedationtime  With hx of right AK popliteal to TP trunk bypass with reversed ipsilateral GSV on 04/22/2019 both by Dr. Chestine Sporelark.   -pt with brisk doppler signals bilateral feet and duplex today reveals patent bypass without stenosis.   He continues to have numbness in his right foot, which has been present since before surgery.  I discussed with him that it may or may not get better and it will take time to determine this.  Discussed with Dr. Chestine Sporelark and showed picture of his toes.  His toes and toe wound are much improved.   -continue plavix/asa/statin -pt will f/u in 6 months with Aorto-iliac and RLE duplex and ABI. -he will call sooner should he have any issues before his next visit.  Doreatha Massed, [facility] [phone]  Clinic MD:   Chestine Spore

## 2019-11-08 ENCOUNTER — Other Ambulatory Visit: Payer: Self-pay | Admitting: *Deleted

## 2019-11-08 DIAGNOSIS — I739 Peripheral vascular disease, unspecified: Secondary | ICD-10-CM

## 2019-12-02 ENCOUNTER — Telehealth: Payer: Self-pay | Admitting: Medical

## 2019-12-02 NOTE — Telephone Encounter (Signed)
Medication:insulin glargine (LANTUS SOLOSTAR  Has the patient contacted their pharmacy? No. (If no, request that the patient contact the pharmacy for the refill.) (If yes, when and what did the pharmacy advise?)  Preferred Pharmacy (with phone number or street name):[facility] [place], [place] - [address] Agent: Please be advised that RX refills may take up to 3 business days. We ask that you follow-up with your pharmacy.

## 2019-12-03 MED ORDER — LANTUS SOLOSTAR 100 UNIT/ML ~~LOC~~ SOPN: 31.0000 [IU] | PEN_INJECTOR | Freq: Every day | SUBCUTANEOUS | 2 refills | Status: DC

## 2019-12-03 NOTE — Telephone Encounter (Signed)
Script faxed to the health department

## 2019-12-03 NOTE — Addendum Note (Signed)
Addended by: Maximino Sarin on: 12/03/2019 08:25 AM   Modules accepted: Orders

## 2019-12-06 ENCOUNTER — Telehealth: Payer: Self-pay | Admitting: Medical

## 2019-12-06 MED ORDER — HYDROCODONE-ACETAMINOPHEN 5-325 MG PO TABS: ORAL_TABLET | ORAL | 0 refills | Status: DC

## 2019-12-06 NOTE — Telephone Encounter (Signed)
Medication: HYDROcodone-acetaminophen (NORCO) 5-325 MG tablet    Has the patient contacted their pharmacy? No. (If no, request that the patient contact the pharmacy for the refill.) (If yes, when and what did the pharmacy advise?)  Preferred Pharmacy (with phone number or street name): [facility] [address] [place], [place] - [address]  [address], [place] [address]  Phone:  [phone] Fax:  [phone]  DEA #:  --  Agent: Please be advised that RX refills may take up to 3 business days. We ask that you follow-up with your pharmacy.

## 2019-12-06 NOTE — Telephone Encounter (Signed)
Patient called back in regards to medication refill.

## 2019-12-06 NOTE — Telephone Encounter (Signed)
Thomas [name]  Contract: 07/14/19 UDS:07/05/19 Last Visit:7/14/241 Next Visit:01/28/20 Last Refill:10/20/19  Please Advise

## 2019-12-06 NOTE — Telephone Encounter (Signed)
Refilled norco. Please go ahead and call pt and let him know he needs to come in for controlled med visit in September. No further refills without appointment in September.

## 2019-12-07 ENCOUNTER — Other Ambulatory Visit: Payer: Self-pay

## 2019-12-07 MED ORDER — CLOPIDOGREL BISULFATE 75 MG PO TABS: 75.0000 mg | ORAL_TABLET | Freq: Every day | ORAL | 6 refills | Status: DC

## 2019-12-07 NOTE — Telephone Encounter (Signed)
Called pt and lvm to return to call to schedule an appt

## 2019-12-07 NOTE — Telephone Encounter (Signed)
Pt has an appt 10/15 do you want me to move it to sept or keep that date for his controlled visit

## 2019-12-07 NOTE — Telephone Encounter (Signed)
Yes move to sept. Please.

## 2020-01-04 ENCOUNTER — Telehealth: Payer: Self-pay | Admitting: Medical

## 2020-01-04 NOTE — Telephone Encounter (Signed)
Medication: insulin glargine (LANTUS SOLOSTAR) 100 UNIT/ML Solostar Pen [[id number]]    Has the patient contacted their pharmacy? No. (If no, request that the patient contact the pharmacy for the refill.) (If yes, when and what did the pharmacy advise?)  Preferred Pharmacy (with phone number or street name): [facility] - [place], [place] - [address]  [address], [place] [address]  Phone:  [phone] Fax:  [phone]  DEA #:  --  Agent: Please be advised that RX refills may take up to 3 business days. We ask that you follow-up with your pharmacy.

## 2020-01-05 MED ORDER — LANTUS SOLOSTAR 100 UNIT/ML ~~LOC~~ SOPN: 31.0000 [IU] | PEN_INJECTOR | Freq: Every day | SUBCUTANEOUS | 2 refills | Status: DC

## 2020-01-05 NOTE — Telephone Encounter (Signed)
Rx sent 

## 2020-01-25 ENCOUNTER — Ambulatory Visit (INDEPENDENT_AMBULATORY_CARE_PROVIDER_SITE_OTHER): Payer: No Typology Code available for payment source | Admitting: Internal Medicine

## 2020-01-25 ENCOUNTER — Encounter: Payer: Self-pay | Admitting: Internal Medicine

## 2020-01-25 ENCOUNTER — Other Ambulatory Visit: Payer: Self-pay

## 2020-01-25 DIAGNOSIS — M4620 Osteomyelitis of vertebra, site unspecified: Secondary | ICD-10-CM

## 2020-01-25 MED ORDER — DOXYCYCLINE HYCLATE 100 MG PO TABS: 100.0000 mg | ORAL_TABLET | Freq: Two times a day (BID) | ORAL | 11 refills | Status: DC

## 2020-01-25 NOTE — Progress Notes (Signed)
[facility]  Patient Active Problem List   Diagnosis Date Noted  . Discitis of thoracolumbar region     Priority: High  . MRSA bacteremia     Priority: High  . Endocarditis of mitral valve     Priority: High  . Gangrene of toe of right foot (HCC) 04/14/2019    Priority: High  . Osteomyelitis of spine (HCC) 04/13/2019    Priority: High  . PAD (peripheral artery disease) (HCC) 06/08/2019  . Hematoma 05/09/2019  . Rectal mass 04/14/2019  . DVT of popliteal vein (HCC) 04/14/2019  . Diabetes mellitus type II, uncontrolled (HCC) 01/27/2015  . Diabetic neuropathy (HCC) 01/27/2015  . HTN (hypertension) 01/27/2015  . Depression 01/27/2015  . Hyperlipidemia 01/27/2015    Patient's Medications  New Prescriptions   No medications on file  Previous Medications   ASPIRIN EC 81 MG TABLET    Take 1 tablet (81 mg total) by mouth daily.   ATORVASTATIN (LIPITOR) 10 MG TABLET    Take 1 tablet (10 mg total) by mouth daily at 6 PM.   CLOPIDOGREL (PLAVIX) 75 MG TABLET    Take 1 tablet (75 mg total) by mouth daily with breakfast.   GABAPENTIN (NEURONTIN) 100 MG CAPSULE    TAKE ONE CAPSULE BY MOUTH AT BEDTIME   HEATING PADS PADS    Use over the counter heating pads as needed for back pain   HYDROCODONE-ACETAMINOPHEN (NORCO) 5-325 MG TABLET    1 tab po every 8 hours as needed for pain   INSULIN GLARGINE (LANTUS SOLOSTAR) 100 UNIT/ML SOLOSTAR PEN    Inject 31 Units into the skin at bedtime.   LANCETS MISC    Use to check sugar once a day.  Dx E11.9   LIDOCAINE (LIDODERM) 5 %    Place 1 patch onto the skin daily. Remove & Discard patch within 12 hours or as directed by MD   LISINOPRIL (ZESTRIL) 20 MG TABLET    Take 1 tablet (20 mg total) by mouth daily.   METFORMIN (GLUCOPHAGE-XR) 500 MG 24 HR TABLET    TAKE TWO TABLETS BY MOUTH TWICE A DAY  Modified Medications   Modified Medication Previous Medication   DOXYCYCLINE (VIBRA-TABS) 100 MG TABLET doxycycline  (VIBRA-TABS) 100 MG tablet      Take 1 tablet (100 mg total) by mouth 2 (two) times daily.    Take 1 tablet (100 mg total) by mouth 2 (two) times daily.  Discontinued Medications   No medications on file    Subjective: Dmonte is in for his routine follow-up visit. He was hospitalized on 01/31/2019 with MRSA bacteremia complicated by lower thoracic spine infection and mitral valve endocarditis.  He had persistent bacteremia for 2 weeks and was switched from vancomycin to daptomycin and ceftaroline.  He was discharged on daptomycin alone, completing therapy on 04/04/2019.    He started to feel worse shortly after completing antibiotic therapy and noted some worrisome changes in the toes of his right foot leading to readmission here on 04/14/2019.  Both blood cultures from 04/13/2019 and 1 of 2 from 04/14/2019 have grown MRSA.  MRI showed progressive infection at the T9 level with new discitis at T8 and T10.  MRI of his right foot showed no definite osteomyelitis or abscess.  He underwent TEE which did not show any vegetations or other evidence of persistent endocarditis.    He required vascular surgery on his right leg during that  admission.  I elected to restart daptomycin and ceftaroline.  He completed 8 weeks of IV antibiotic therapy on 06/17/2019 before converting to oral doxycycline.  He has not had any problems tolerating his antibiotic. He is improving. His pain is better but still remains about 7 out of 10.  It was 10+ out of 10 when hospitalized.  He says that he is now able to do some walking.   Review of Systems: Review of Systems  Constitutional: Negative for fever and weight loss.  HENT: Negative for congestion and sore throat.   Respiratory: Negative for cough and shortness of breath.   Cardiovascular: Negative for chest pain.  Gastrointestinal: Negative for abdominal pain, diarrhea, nausea and vomiting.  Musculoskeletal: Positive for back pain.  Skin: Negative for rash.    Past  Medical History:  Diagnosis Date  . Depression 01/27/2015  . Diabetes mellitus without complication (HCC)   . Hyperlipidemia   . Hypertension   . Peripheral vascular disease (HCC)     Social History   Tobacco Use  . Smoking status: Current Every Day Smoker    Packs/day: 1.00    Types: Cigarettes  . Smokeless tobacco: Former Neurosurgeon  . Tobacco comment: 5 cigarrettes a day  Vaping Use  . Vaping Use: Never used  Substance Use Topics  . Alcohol use: No    Alcohol/week: 0.0 standard drinks  . Drug use: No    No family history on file.  No Known Allergies  Objective: Vitals:   01/25/20 1413  BP: 119/70  Pulse: 77  Temp: 98.2 F (36.8 C)  TempSrc: Oral  SpO2: 98%  Weight: 227 lb (103 kg)   Body mass index is 34.52 kg/m.  Physical Exam Constitutional:      Comments: I interviewed him with the aid of the interpreter.    Cardiovascular:     Rate and Rhythm: Normal rate and regular rhythm.     Heart sounds: No murmur heard.   Pulmonary:     Effort: Pulmonary effort is normal.     Breath sounds: Normal breath sounds.  Abdominal:     Palpations: Abdomen is soft.     Tenderness: There is no abdominal tenderness.  Musculoskeletal:     Comments: He has no kyphotic deformities of the spine.  Skin:    Findings: No rash.  Neurological:     General: No focal deficit present.  Psychiatric:        Mood and Affect: Mood normal.   Thoracic spine x-ray 09/14/2019 IMPRESSION: Changes of previous discitis osteomyelitis centered at T9 with marked collapse at T9. Lucency in the inferior aspect of the T8 vertebral body and more subtly in the superior aspect of the T10 vertebral body corresponds with abnormality noted on previous MR. No new fracture or new obvious bony involvement. No soft tissue extension of infection noted. Other vertebral body levels appear Unremarkable.  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  His inflammatory markers have normalized but he prefers to stay on doxycycline for now.  I will see if we can arrange outpatient physical  therapy.  He will follow-up here in 3 months.      Relevant Medications   doxycycline (VIBRA-TABS) 100 MG tablet   Other Relevant Orders   C-reactive protein   Sedimentation rate      Cliffton Asters, MD [facility] for Infectious Disease [facility] [phone] pager   [phone] cell 01/25/2020, 2:50 PM

## 2020-01-25 NOTE — Addendum Note (Signed)
Addended by: Cliffton Asters on: 01/25/2020 02:52 PM   Modules accepted: Orders

## 2020-01-25 NOTE — Assessment & Plan Note (Signed)
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  His inflammatory markers have normalized but he prefers to stay on doxycycline for now.  I will see if we can arrange outpatient physical therapy.  He will follow-up here in 3 months.

## 2020-01-26 LAB — C-REACTIVE PROTEIN: CRP: 2.9 mg/L (ref <8.0)

## 2020-01-26 LAB — SEDIMENTATION RATE: Sed Rate: 6 mm/h (ref 0–15)

## 2020-01-28 ENCOUNTER — Ambulatory Visit: Payer: No Typology Code available for payment source | Admitting: Medical

## 2020-01-28 ENCOUNTER — Other Ambulatory Visit: Payer: Self-pay

## 2020-01-28 VITALS — BP 133/72 | HR 73 | Temp 97.9°F | Resp 18 | Ht 68.0 in | Wt 227.0 lb

## 2020-01-28 DIAGNOSIS — E119 Type 2 diabetes mellitus without complications: Secondary | ICD-10-CM

## 2020-01-28 DIAGNOSIS — E785 Hyperlipidemia, unspecified: Secondary | ICD-10-CM

## 2020-01-28 DIAGNOSIS — M869 Osteomyelitis, unspecified: Secondary | ICD-10-CM

## 2020-01-28 DIAGNOSIS — R1013 Epigastric pain: Secondary | ICD-10-CM

## 2020-01-28 DIAGNOSIS — F17200 Nicotine dependence, unspecified, uncomplicated: Secondary | ICD-10-CM

## 2020-01-28 DIAGNOSIS — I10 Essential (primary) hypertension: Secondary | ICD-10-CM

## 2020-01-28 MED ORDER — FAMOTIDINE 20 MG PO TABS
20.0000 mg | ORAL_TABLET | Freq: Every day | ORAL | 3 refills | Status: DC
Start: 2020-01-28 — End: 2020-01-28

## 2020-01-28 MED ORDER — FAMOTIDINE 20 MG PO TABS: 20.0000 mg | ORAL_TABLET | Freq: Every day | ORAL | 3 refills | Status: DC

## 2020-01-28 MED ORDER — LANTUS SOLOSTAR 100 UNIT/ML ~~LOC~~ SOPN: 31.0000 [IU] | PEN_INJECTOR | Freq: Every day | SUBCUTANEOUS | 2 refills | Status: DC

## 2020-01-28 NOTE — Patient Instructions (Addendum)
Your blood pressure is well controlled today.  Continue with lisinopril.  For history of osteomyelitis, I do recommend that you continue doxycycline per infectious disease advisement.  Make sure you eat before you take the doxycycline.  You do describe some epigastric discomfort that sounds like heartburn/GERD.  I prescribed famotadine.  Also place order for CBC, lipase and H. pylori IgG antibody.  CMP done 2 days ago so not ordering that.  Do recommend also that she get probiotics over-the-counter.  Would recommend getting a [facility] or [facility] brand as probiotics can be very expensive.  For eat probiotic rich foods.  But which she is probiotic foods that are low in sugar.  I do recommend that you try to quit smoking again.  For diabetes, continue Lantus.  Prescription sent to pharmacy and asked staff to call over to your pharmacy and make sure that they get the prescription refill request.  Will get A1c today.  For hyperlipidemia continue current statin.  Follow-up in 3 months or as needed.

## 2020-01-28 NOTE — Progress Notes (Signed)
Subjective:    Patient ID: Thomas [name], male    DOB: 1976-02-19, 44 y.o.   MRN: [id number]  HPI   Pt in for follow up.(exam done with Arabic interpretor)+  Pt has hx of osteomyelitis of spine. Seeing the specialist and he is still on antibiotics. Pt state he can't sleep at night due to pain. He is on doxycycline twice a day.  Pt specialist notes plan. 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  His inflammatory markers have normalized but he prefers to stay on doxycycline for now.  I will see if we can arrange outpatient physical therapy.  He will follow-up here in 3 months.      Relevant Medications   doxycycline (VIBRA-TABS) 100 MG tablet   Other Relevant Orders   C-reactive protein   Sedimentation rate    Pt is expressing bit frustration for having to take antibiotics such a long time. He is not reporting side effects. But does have some heart burn. Eats before takes doxy. Some burping and heartburn.   Pt back pain is severe without medication. More at night but controlled with norco. But he states makes him drowsy.   Pt last sugar was a1c was 6.7. Pt is on lantus 30-31 units a day. Still on metformin.   Review of Systems  Constitutional: Negative for chills, fatigue and fever.  Respiratory: Negative for cough, chest tightness, shortness of breath and wheezing.   Cardiovascular: Negative for chest pain and palpitations.  Gastrointestinal: Negative for abdominal pain, blood in stool, diarrhea and nausea.  Musculoskeletal: Positive for back pain.  Skin: Negative for rash.  Neurological: Negative for dizziness, tremors, seizures, weakness, numbness and headaches.  Hematological: Negative for adenopathy. Does not bruise/bleed easily.  Psychiatric/Behavioral: Negative for behavioral problems, confusion and sleep disturbance. The patient is not nervous/anxious.      Past Medical History:  Diagnosis Date  . Depression 01/27/2015  . Diabetes mellitus without complication (HCC)   . Hyperlipidemia   . Hypertension   . Peripheral vascular disease (HCC)      Social History   Socioeconomic History  . Marital status: Single    Spouse name: Not on file  . Number of children: Not on file  . Years of education: Not on file  . Highest education level: Not on file  Occupational History  . Not on file  Tobacco Use  . Smoking status: Current Every Day Smoker    Packs/day: 1.00    Types: Cigarettes  . Smokeless tobacco: Former Neurosurgeon  . Tobacco comment: 5 cigarrettes a day  Vaping Use  . Vaping Use: Never used  Substance and Sexual Activity  . Alcohol use: No    Alcohol/week: 0.0 standard drinks  . Drug use: No  . Sexual activity: Not on file  Other Topics Concern  . Not on file  Social History Narrative  . Not on file   Social Determinants of Health   Financial Resource Strain:   . Difficulty of Paying Living Expenses: Not on file  Food Insecurity:   . Worried About Programme researcher, broadcasting/film/video in the Last Year: Not on file  . Ran Out of Food in the Last Year: Not on file  Transportation Needs:   . Lack of Transportation (Medical): Not on file  . Lack of Transportation (Non-Medical): Not on file  Physical Activity:   . Days of Exercise  per Week: Not on file  . Minutes of Exercise per Session: Not on file  Stress:   . Feeling of Stress : Not on file  Social Connections:   . Frequency of Communication with Friends and Family: Not on file  . Frequency of Social Gatherings with Friends and Family: Not on file  . Attends Religious Services: Not on file  . Active Member of Clubs or Organizations: Not on file  . Attends Banker Meetings: Not on file  . Marital Status: Not on file  Intimate Partner Violence:   . Fear of Current or Ex-Partner: Not on file  . Emotionally Abused: Not on file  . Physically Abused: Not on file  .  Sexually Abused: Not on file    Past Surgical History:  Procedure Laterality Date  . ABDOMINAL AORTOGRAM W/LOWER EXTREMITY Right 04/19/2019   Procedure: ABDOMINAL AORTOGRAM W/LOWER EXTREMITY;  Surgeon: [name], MD;  Location: [facility] INVASIVE CV LAB;  Service: Cardiovascular;  Laterality: Right;  . ABDOMINAL AORTOGRAM W/LOWER EXTREMITY Right 09/16/2019   Procedure: ABDOMINAL AORTOGRAM W/LOWER EXTREMITY;  Surgeon: [name], MD;  Location: [facility] INVASIVE CV LAB;  Service: Cardiovascular;  Laterality: Right;  . FEMORAL-TIBIAL BYPASS GRAFT Right 04/22/2019   Procedure: Right  FEMORAL-TIBIAL ARTERY BYPASS GRAFT with Harvest of Saphenous vein RIGHT SUPERFICIAL FEMORAL ARTERY TO TIBIOPERONEAL TRUNK BYPASS;  Surgeon: [name], MD;  Location: [facility] OR;  Service: Vascular;  Laterality: Right;  . IR CERVICAL/THORACIC DISC ASPIRATION W/IMAG GUIDE  04/15/2019  . IR FLUORO GUIDED NEEDLE PLC ASPIRATION/INJECTION LOC  04/15/2019  . kidney stones  2009  . PERIPHERAL VASCULAR BALLOON ANGIOPLASTY Right 09/16/2019   Procedure: PERIPHERAL VASCULAR BALLOON ANGIOPLASTY;  Surgeon: [name], MD;  Location: [facility] INVASIVE CV LAB;  Service: Cardiovascular;  Laterality: Right;  ext iliac DCB  . PERIPHERAL VASCULAR INTERVENTION  04/19/2019   Procedure: PERIPHERAL VASCULAR INTERVENTION;  Surgeon: [name], MD;  Location: [facility] INVASIVE CV LAB;  Service: Cardiovascular;;  right external iliac  . TEE WITHOUT CARDIOVERSION N/A 04/19/2019   Procedure: TRANSESOPHAGEAL ECHOCARDIOGRAM (TEE);  Surgeon: [name], MD;  Location: [facility] ENDOSCOPY;  Service: Endoscopy;  Laterality: N/A;    No family history on file.  No Known Allergies  Current Outpatient Medications on File Prior to Visit  Medication Sig Dispense Refill  . atorvastatin (LIPITOR) 10 MG tablet Take 1 tablet (10 mg total) by mouth daily at 6 PM. 30 tablet 2  . clopidogrel (PLAVIX) 75 MG tablet Take 1 tablet (75 mg total)  by mouth daily with breakfast. 30 tablet 6  . doxycycline (VIBRA-TABS) 100 MG tablet Take 1 tablet (100 mg total) by mouth 2 (two) times daily. 60 tablet 11  . HYDROcodone-acetaminophen (NORCO) 5-325 MG tablet 1 tab po every 8 hours as needed for pain 90 tablet 0  . Lancets MISC Use to check sugar once a day.  Dx E11.9 100 each 1  . lisinopril (ZESTRIL) 20 MG tablet Take 1 tablet (20 mg total) by mouth daily. 90 tablet 3  . aspirin EC 81 MG tablet Take 1 tablet (81 mg total) by mouth daily. (Patient not taking: Reported on 01/25/2020) 150 tablet 2  . gabapentin (NEURONTIN) 100 MG capsule TAKE ONE CAPSULE BY MOUTH AT BEDTIME (Patient not taking: Reported on 01/25/2020) 30 capsule 0  . Heating Pads PADS Use over the counter heating pads as needed for back pain (Patient not taking: Reported on 01/25/2020) 1 Units 5  . lidocaine (LIDODERM)  5 % Place 1 patch onto the skin daily. Remove & Discard patch within 12 hours or as directed by MD (Patient not taking: Reported on 01/25/2020) 30 patch 0  . metFORMIN (GLUCOPHAGE-XR) 500 MG 24 hr tablet TAKE TWO TABLETS BY MOUTH TWICE A DAY (Patient not taking: Reported on 01/25/2020) 120 tablet 0   No current facility-administered medications on file prior to visit.    BP 133/72   Pulse 73   Temp 97.9 F (36.6 C) (Oral)   Resp 18   Ht 5\' 8"  (1.727 m)   Wt 227 lb (103 kg)   SpO2 98%   BMI 34.52 kg/m       Objective:   Physical Exam  General- No acute distress. Pleasant patient. Neck- Full range of motion, no jvd Lungs- Clear, even and unlabored. Heart- regular rate and rhythm. Neurologic- CNII- XII grossly intact. Abdomen- soft, mild , mild epigastric tenderenss, nd, non distended, +bs, no rebound or guarding.      Assessment & Plan:  [phone].  Your blood pressure is well controlled today.  Continue with lisinopril.  For history of osteomyelitis, I do recommend that you continue doxycycline per infectious disease advisement.  Make sure  you eat before you take the doxycycline.  You do describe some epigastric discomfort that sounds like heartburn/GERD.  I prescribed famotadine.  Also place order for CBC, lipase and H. pylori IgG antibody.  CMP done 2 days ago so not ordering that.  Do recommend also that she get probiotics over-the-counter.  Would recommend getting a [facility] or [facility] brand as probiotics can be very expensive.  For eat probiotic rich foods.  But which she is probiotic foods that are low in sugar.  I do recommend that you try to quit smoking again.  For diabetes, continue Lantus.  Prescription sent to pharmacy and asked staff to call over to your pharmacy and make sure that they get the prescription refill request.  Will get A1c today.  For hyperlipidemia continue current statin.  Follow-up in 3 months or as needed.  Time spent with patient today was 40+   minutes which consisted of chart review, discussing diagnosis, work up, treatment and documentation.

## 2020-01-29 LAB — CBC WITH DIFFERENTIAL/PLATELET
Absolute Monocytes: 537 cells/uL (ref 200–950)
Basophils Absolute: 44 cells/uL (ref 0–200)
Basophils Relative: 0.5 %
Eosinophils Absolute: 123 cells/uL (ref 15–500)
Eosinophils Relative: 1.4 %
HCT: 45.8 % (ref 38.5–50.0)
Hemoglobin: 15.1 g/dL (ref 13.2–17.1)
Lymphs Abs: 2728 cells/uL (ref 850–3900)
MCH: 28.7 pg (ref 27.0–33.0)
MCHC: 33 g/dL (ref 32.0–36.0)
MCV: 86.9 fL (ref 80.0–100.0)
MPV: 12.1 fL (ref 7.5–12.5)
Monocytes Relative: 6.1 %
Neutro Abs: 5368 cells/uL (ref 1500–7800)
Neutrophils Relative %: 61 %
Platelets: 247 10*3/uL (ref 140–400)
RBC: 5.27 10*6/uL (ref 4.20–5.80)
RDW: 12.7 % (ref 11.0–15.0)
Total Lymphocyte: 31 %
WBC: 8.8 10*3/uL (ref 3.8–10.8)

## 2020-01-29 LAB — LIPASE: Lipase: 24 U/L (ref 7–60)

## 2020-01-29 LAB — HEMOGLOBIN A1C
Hgb A1c MFr Bld: 8.3 % of total Hgb — ABNORMAL HIGH (ref <5.7)
Mean Plasma Glucose: 192 (calc)
eAG (mmol/L): 10.6 (calc)

## 2020-01-31 LAB — H. PYLORI ANTIBODY, IGG: H. pylori, IgG AbS: 0.48 Index Value (ref 0.00–0.79)

## 2020-03-06 ENCOUNTER — Telehealth: Payer: Self-pay | Admitting: Medical

## 2020-03-06 NOTE — Telephone Encounter (Signed)
New Pharmacy Medication: insulin glargine (LANTUS SOLOSTAR) 100 UNIT/ML Solostar Pen [[id number]]      Has the patient contacted their pharmacy?  (If no, request that the patient contact the pharmacy for the refill.) (If yes, when and what did the pharmacy advise?)     Preferred Pharmacy (with phone number or street name):     [facility] - [place], [place] - [address] Phone:  [phone]  Fax:  [phone]       Agent: Please be advised that RX refills may take up to 3 business days. We ask that you follow-up with your pharmacy.

## 2020-03-07 MED ORDER — LANTUS SOLOSTAR 100 UNIT/ML ~~LOC~~ SOPN: 31.0000 [IU] | PEN_INJECTOR | Freq: Every day | SUBCUTANEOUS | 2 refills | Status: DC

## 2020-03-07 NOTE — Telephone Encounter (Signed)
Rx sent 

## 2020-04-06 DIAGNOSIS — Z0289 Encounter for other administrative examinations: Secondary | ICD-10-CM

## 2020-04-26 ENCOUNTER — Other Ambulatory Visit: Payer: Self-pay

## 2020-04-26 ENCOUNTER — Ambulatory Visit (INDEPENDENT_AMBULATORY_CARE_PROVIDER_SITE_OTHER): Payer: Medicaid Other | Admitting: Internal Medicine

## 2020-04-26 ENCOUNTER — Encounter: Payer: Self-pay | Admitting: Internal Medicine

## 2020-04-26 DIAGNOSIS — M4645 Discitis, unspecified, thoracolumbar region: Secondary | ICD-10-CM

## 2020-04-26 NOTE — Assessment & Plan Note (Signed)
He is improving steadily and his inflammatory markers have now been normalized for several months.  I am quite hopeful that his MRSA infection has been cured.  He will stop doxycycline now and follow-up in 6 weeks.

## 2020-04-26 NOTE — Progress Notes (Signed)
[facility]  Patient Active Problem List   Diagnosis Date Noted  . Discitis of thoracolumbar region     Priority: High  . MRSA bacteremia     Priority: High  . Endocarditis of mitral valve     Priority: High  . Gangrene of toe of right foot (HCC) 04/14/2019    Priority: High  . Osteomyelitis of spine (HCC) 04/13/2019    Priority: High  . PAD (peripheral artery disease) (HCC) 06/08/2019  . Hematoma 05/09/2019  . Rectal mass 04/14/2019  . DVT of popliteal vein (HCC) 04/14/2019  . Diabetes mellitus type II, uncontrolled (HCC) 01/27/2015  . Diabetic neuropathy (HCC) 01/27/2015  . HTN (hypertension) 01/27/2015  . Depression 01/27/2015  . Hyperlipidemia 01/27/2015    Patient's Medications  New Prescriptions   No medications on file  Previous Medications   ATORVASTATIN (LIPITOR) 10 MG TABLET    Take 1 tablet (10 mg total) by mouth daily at 6 PM.   CLOPIDOGREL (PLAVIX) 75 MG TABLET    Take 1 tablet (75 mg total) by mouth daily with breakfast.   HEATING PADS PADS    Use over the counter heating pads as needed for back pain   INSULIN GLARGINE (LANTUS SOLOSTAR) 100 UNIT/ML SOLOSTAR PEN    Inject 31 Units into the skin at bedtime.   LISINOPRIL (ZESTRIL) 20 MG TABLET    Take 1 tablet (20 mg total) by mouth daily.  Modified Medications   No medications on file  Discontinued Medications   ASPIRIN EC 81 MG TABLET    Take 1 tablet (81 mg total) by mouth daily.   DOXYCYCLINE (VIBRA-TABS) 100 MG TABLET    Take 1 tablet (100 mg total) by mouth 2 (two) times daily.   FAMOTIDINE (PEPCID) 20 MG TABLET    Take 1 tablet (20 mg total) by mouth daily.   GABAPENTIN (NEURONTIN) 100 MG CAPSULE    TAKE ONE CAPSULE BY MOUTH AT BEDTIME   HYDROCODONE-ACETAMINOPHEN (NORCO) 5-325 MG TABLET    1 tab po every 8 hours as needed for pain   LANCETS MISC    Use to check sugar once a day.  Dx E11.9   LIDOCAINE (LIDODERM) 5 %    Place 1 patch onto the skin daily. Remove &  Discard patch within 12 hours or as directed by MD   METFORMIN (GLUCOPHAGE-XR) 500 MG 24 HR TABLET    TAKE TWO TABLETS BY MOUTH TWICE A DAY    Subjective: Thomas [name] is in for his routine follow-up visit. He was hospitalized on 01/31/2019 with MRSA bacteremia complicated by lower thoracic spine infection and mitral valve endocarditis.  He had persistent bacteremia for 2 weeks and was switched from vancomycin to daptomycin and ceftaroline.  He was discharged on daptomycin alone, completing therapy on 04/04/2019.    He started to feel worse shortly after completing antibiotic therapy and noted some worrisome changes in the toes of his right foot leading to readmission here on 04/14/2019.  Both blood cultures from 04/13/2019 and 1 of 2 from 04/14/2019 have grown MRSA.  MRI showed progressive infection at the T9 level with new discitis at T8 and T10.  MRI of his right foot showed no definite osteomyelitis or abscess.  He underwent TEE which did not show any vegetations or other evidence of persistent endocarditis.    He required vascular surgery on his right leg during that admission.  I elected to restart daptomycin and ceftaroline.  He completed 8 weeks of IV antibiotic therapy on 06/17/2019 before converting to oral doxycycline.    He has not had any problems tolerating his antibiotic.  When he saw his PCP in October he mentioned that he was having heartburn.  He denies any heartburn currently.  He says that he has occasional episodes of vomiting but he is not sure that this is related to his antibiotic.  He is not having any diarrhea.  His back pain is slowly improving.  It bothers him most at night when he is in bed.  His PCP recommended physical therapy but it has not started yet.  He was approved for Medicaid last week. Review of Systems: Review of Systems  Constitutional: Negative for fever and weight loss.  HENT: Negative for congestion and sore throat.   Respiratory: Negative for cough and shortness of  breath.   Cardiovascular: Negative for chest pain.  Gastrointestinal: Negative for abdominal pain, diarrhea, nausea and vomiting.  Musculoskeletal: Positive for back pain.  Skin: Negative for rash.    Past Medical History:  Diagnosis Date  . Depression 01/27/2015  . Diabetes mellitus without complication (HCC)   . Hyperlipidemia   . Hypertension   . Peripheral vascular disease (HCC)     Social History   Tobacco Use  . Smoking status: Current Every Day Smoker    Packs/day: 1.00    Types: Cigarettes  . Smokeless tobacco: Former Neurosurgeon  . Tobacco comment: 5 cigarrettes a day  Vaping Use  . Vaping Use: Never used  Substance Use Topics  . Alcohol use: No    Alcohol/week: 0.0 standard drinks  . Drug use: No    No family history on file.  No Known Allergies  Objective: Vitals:   04/26/20 1025  BP: (!) 149/98  Pulse: (!) 109  Resp: 16  Temp: 97.8 F (36.6 C)  TempSrc: Oral  SpO2: 97%  Weight: 232 lb (105.2 kg)  Height: 5\' 8"  (1.727 m)   Body mass index is 35.28 kg/m.  Physical Exam Constitutional:      Comments: I interviewed him with the aid of the interpreter.  He is talkative and in good spirits.  Cardiovascular:     Rate and Rhythm: Normal rate and regular rhythm.     Heart sounds: No murmur heard.   Pulmonary:     Effort: Pulmonary effort is normal.     Breath sounds: Normal breath sounds.  Abdominal:     Palpations: Abdomen is soft.     Tenderness: There is no abdominal tenderness.  Musculoskeletal:     Comments: He has no kyphotic deformities of the spine.  Skin:    Findings: No rash.  Neurological:     General: No focal deficit present.  Psychiatric:        Mood and Affect: Mood normal.   Thoracic spine x-ray 09/14/2019 IMPRESSION: Changes of previous discitis osteomyelitis centered at T9 with marked collapse at T9. Lucency in the inferior aspect of the T8 vertebral body and more subtly in the superior aspect of the T10 vertebral body  corresponds with abnormality noted on previous MR. No new fracture or new obvious bony involvement. No soft tissue extension of infection noted. Other vertebral body levels appear Unremarkable.  Sed Rate (mm/h)  Date Value  01/25/2020 6  09/14/2019 9  08/03/2019 17 (H)   CRP (mg/L)  Date Value  01/25/2020 2.9  09/14/2019 1.5  08/03/2019 3.9   Problem List Items Addressed This Visit  High   Discitis of thoracolumbar region    He is improving steadily and his inflammatory markers have now been normalized for several months.  I am quite hopeful that his MRSA infection has been cured.  He will stop doxycycline now and follow-up in 6 weeks.         Cliffton Asters, MD [facility] [phone] pager   [phone] cell 04/26/2020, 10:45 AM

## 2020-05-01 ENCOUNTER — Ambulatory Visit: Payer: Medicaid Other | Admitting: Medical

## 2020-05-05 ENCOUNTER — Encounter: Payer: Self-pay | Admitting: Medical

## 2020-05-05 ENCOUNTER — Other Ambulatory Visit: Payer: Self-pay

## 2020-05-05 ENCOUNTER — Ambulatory Visit: Payer: Medicaid Other | Admitting: Medical

## 2020-05-05 ENCOUNTER — Telehealth: Payer: Self-pay | Admitting: Medical

## 2020-05-05 VITALS — BP 130/80 | HR 55 | Resp 18 | Ht 68.0 in | Wt 234.0 lb

## 2020-05-05 DIAGNOSIS — I10 Essential (primary) hypertension: Secondary | ICD-10-CM | POA: Diagnosis not present

## 2020-05-05 DIAGNOSIS — K219 Gastro-esophageal reflux disease without esophagitis: Secondary | ICD-10-CM | POA: Diagnosis not present

## 2020-05-05 DIAGNOSIS — N5319 Other ejaculatory dysfunction: Secondary | ICD-10-CM | POA: Diagnosis not present

## 2020-05-05 DIAGNOSIS — M659 Synovitis and tenosynovitis, unspecified: Secondary | ICD-10-CM | POA: Diagnosis not present

## 2020-05-05 DIAGNOSIS — S22070A Wedge compression fracture of T9-T10 vertebra, initial encounter for closed fracture: Secondary | ICD-10-CM

## 2020-05-05 DIAGNOSIS — M4800 Spinal stenosis, site unspecified: Secondary | ICD-10-CM | POA: Diagnosis not present

## 2020-05-05 DIAGNOSIS — R1111 Vomiting without nausea: Secondary | ICD-10-CM | POA: Diagnosis not present

## 2020-05-05 DIAGNOSIS — E119 Type 2 diabetes mellitus without complications: Secondary | ICD-10-CM | POA: Diagnosis not present

## 2020-05-05 LAB — CBC WITH DIFFERENTIAL/PLATELET
Basophils Absolute: 0 10*3/uL (ref 0.0–0.1)
Basophils Relative: 0.3 % (ref 0.0–3.0)
Eosinophils Absolute: 0.1 10*3/uL (ref 0.0–0.7)
Eosinophils Relative: 1.4 % (ref 0.0–5.0)
HCT: 47.1 % (ref 39.0–52.0)
Hemoglobin: 15.9 g/dL (ref 13.0–17.0)
Lymphocytes Relative: 26.7 % (ref 12.0–46.0)
Lymphs Abs: 2.5 10*3/uL (ref 0.7–4.0)
MCHC: 33.8 g/dL (ref 30.0–36.0)
MCV: 85.9 fl (ref 78.0–100.0)
Monocytes Absolute: 0.6 10*3/uL (ref 0.1–1.0)
Monocytes Relative: 6.2 % (ref 3.0–12.0)
Neutro Abs: 6.1 10*3/uL (ref 1.4–7.7)
Neutrophils Relative %: 65.4 % (ref 43.0–77.0)
Platelets: 234 10*3/uL (ref 150.0–400.0)
RBC: 5.48 Mil/uL (ref 4.22–5.81)
RDW: 14 % (ref 11.5–15.5)
WBC: 9.3 10*3/uL (ref 4.0–10.5)

## 2020-05-05 LAB — HEMOGLOBIN A1C: Hgb A1c MFr Bld: 10.1 % — ABNORMAL HIGH (ref 4.6–6.5)

## 2020-05-05 LAB — COMPREHENSIVE METABOLIC PANEL
ALT: 28 U/L (ref 0–53)
AST: 16 U/L (ref 0–37)
Albumin: 4.3 g/dL (ref 3.5–5.2)
Alkaline Phosphatase: 105 U/L (ref 39–117)
BUN: 21 mg/dL (ref 6–23)
CO2: 30 mEq/L (ref 19–32)
Calcium: 9.9 mg/dL (ref 8.4–10.5)
Chloride: 99 mEq/L (ref 96–112)
Creatinine, Ser: 1.1 mg/dL (ref 0.40–1.50)
GFR: 82.21 mL/min (ref >60.00)
Glucose, Bld: 300 mg/dL — ABNORMAL HIGH (ref 70–99)
Potassium: 4.6 mEq/L (ref 3.5–5.1)
Sodium: 134 mEq/L — ABNORMAL LOW (ref 135–145)
Total Bilirubin: 0.4 mg/dL (ref 0.2–1.2)
Total Protein: 7.2 g/dL (ref 6.0–8.3)

## 2020-05-05 LAB — LIPID PANEL
Cholesterol: 160 mg/dL (ref 0–200)
HDL: 38.7 mg/dL — ABNORMAL LOW (ref >39.00)
NonHDL: 120.84
Total CHOL/HDL Ratio: 4
Triglycerides: 265 mg/dL — ABNORMAL HIGH (ref 0.0–149.0)
VLDL: 53 mg/dL — ABNORMAL HIGH (ref 0.0–40.0)

## 2020-05-05 LAB — LDL CHOLESTEROL, DIRECT: Direct LDL: 86 mg/dL

## 2020-05-05 MED ORDER — CLOPIDOGREL BISULFATE 75 MG PO TABS: 75.0000 mg | ORAL_TABLET | Freq: Every day | ORAL | 6 refills | Status: DC

## 2020-05-05 MED ORDER — ATORVASTATIN CALCIUM 10 MG PO TABS: 10.0000 mg | ORAL_TABLET | Freq: Every day | ORAL | 2 refills | Status: DC

## 2020-05-05 MED ORDER — LANTUS SOLOSTAR 100 UNIT/ML ~~LOC~~ SOPN: PEN_INJECTOR | SUBCUTANEOUS | 2 refills | Status: DC

## 2020-05-05 MED ORDER — LISINOPRIL 20 MG PO TABS: 20.0000 mg | ORAL_TABLET | Freq: Every day | ORAL | 3 refills | Status: DC

## 2020-05-05 MED ORDER — FAMOTIDINE 20 MG PO TABS
20.0000 mg | ORAL_TABLET | Freq: Two times a day (BID) | ORAL | 3 refills | Status: DC
Start: 2020-05-05 — End: 2020-08-03

## 2020-05-05 NOTE — Telephone Encounter (Signed)
On pt referrals he prefers [place]. Also will you let specialist office know if they can call interpretor service to give address, name, location etc.  Number of interpretor service  [phone].

## 2020-05-05 NOTE — Progress Notes (Signed)
Subjective:    Patient ID: Thomas [name], male    DOB: November 25, 1975, 45 y.o.   MRN: [id number]  HPI    Pt in for follow up.(exam done with Arabic interpretor)  Pt has hx of osteomyelitis of spine.  Specialist ID  has stopped the antibiotic.  Still has siginficant pain. Hurts to lift things. Hx of compression fracture and spinal stenosis.  Previously I had written him norco for pain. But he states made him too sleepy though did control his pain. No use norco for 2 months. Pt also states that tramadol made him excessively sleepy.  Pt states recent sugar was around 300. Pt is using scale of 10 units for 200 sugar level. Then using 20 units if his sugar is 300.    Pt sugar this am was 112. Last time he took lantus at night was 2 days when sugar was  200 and he took 10 units. Sometimes will take 20 units when sugar 300.(note not the way I advised pt to use)  Pt needs refill of insulin again.    He has some reflux type symptoms with epigastric burning. He will vomit maybe once a week. But has reflux everything.  Pt bp is well controlled today. 150/77.  Pt states also when he has orgasm has sensation of having to ejaculate but no fluid comes out. No testicle pain.   Left hand thumb popping when he moves has crepitus but no pain. Also since back surgery has slight numbness to left 5th, 4th and 3rd finger. No neck pain. Same level of numbness since surgery. Surgeon told pt side effect from surgery and finger numbness would resolve in 2-3 weeks.      Review of Systems  Constitutional: Negative for chills, fatigue and fever.  Respiratory: Negative for cough, chest tightness, wheezing and stridor.   Gastrointestinal: Positive for abdominal pain. Negative for blood in stool, diarrhea and nausea.       See hpi reflux with occasional vomiting.  Genitourinary: Negative for dysuria.  Musculoskeletal: Positive for back pain. Negative for myalgias and neck stiffness.       See hpi.  Skin:  Negative for rash.  Neurological: Negative for dizziness, speech difficulty, numbness and headaches.  Hematological: Negative for adenopathy. Does not bruise/bleed easily.  Psychiatric/Behavioral: Negative for behavioral problems, decreased concentration and dysphoric mood.    Past Medical History:  Diagnosis Date  . Depression 01/27/2015  . Diabetes mellitus without complication (HCC)   . Hyperlipidemia   . Hypertension   . Peripheral vascular disease (HCC)      Social History   Socioeconomic History  . Marital status: Single    Spouse name: Not on file  . Number of children: Not on file  . Years of education: Not on file  . Highest education level: Not on file  Occupational History  . Not on file  Tobacco Use  . Smoking status: Current Every Day Smoker    Packs/day: 1.00    Types: Cigarettes  . Smokeless tobacco: Former Neurosurgeon  . Tobacco comment: 5 cigarrettes a day  Vaping Use  . Vaping Use: Never used  Substance and Sexual Activity  . Alcohol use: No    Alcohol/week: 0.0 standard drinks  . Drug use: No  . Sexual activity: Not on file  Other Topics Concern  . Not on file  Social History Narrative  . Not on file   Social Determinants of Health   Financial Resource Strain: Not on file  Food  Insecurity: Not on file  Transportation Needs: Not on file  Physical Activity: Not on file  Stress: Not on file  Social Connections: Not on file  Intimate Partner Violence: Not on file    Past Surgical History:  Procedure Laterality Date  . ABDOMINAL AORTOGRAM W/LOWER EXTREMITY Right 04/19/2019   Procedure: ABDOMINAL AORTOGRAM W/LOWER EXTREMITY;  Surgeon: [name], MD;  Location: [facility] INVASIVE CV LAB;  Service: Cardiovascular;  Laterality: Right;  . ABDOMINAL AORTOGRAM W/LOWER EXTREMITY Right 09/16/2019   Procedure: ABDOMINAL AORTOGRAM W/LOWER EXTREMITY;  Surgeon: [name], MD;  Location: [facility] INVASIVE CV LAB;  Service: Cardiovascular;  Laterality: Right;   . FEMORAL-TIBIAL BYPASS GRAFT Right 04/22/2019   Procedure: Right  FEMORAL-TIBIAL ARTERY BYPASS GRAFT with Harvest of Saphenous vein RIGHT SUPERFICIAL FEMORAL ARTERY TO TIBIOPERONEAL TRUNK BYPASS;  Surgeon: [name], MD;  Location: [facility] OR;  Service: Vascular;  Laterality: Right;  . IR CERVICAL/THORACIC DISC ASPIRATION W/IMAG GUIDE  04/15/2019  . IR FLUORO GUIDED NEEDLE PLC ASPIRATION/INJECTION LOC  04/15/2019  . kidney stones  2009  . PERIPHERAL VASCULAR BALLOON ANGIOPLASTY Right 09/16/2019   Procedure: PERIPHERAL VASCULAR BALLOON ANGIOPLASTY;  Surgeon: [name], MD;  Location: [facility] INVASIVE CV LAB;  Service: Cardiovascular;  Laterality: Right;  ext iliac DCB  . PERIPHERAL VASCULAR INTERVENTION  04/19/2019   Procedure: PERIPHERAL VASCULAR INTERVENTION;  Surgeon: [name], MD;  Location: [facility] INVASIVE CV LAB;  Service: Cardiovascular;;  right external iliac  . TEE WITHOUT CARDIOVERSION N/A 04/19/2019   Procedure: TRANSESOPHAGEAL ECHOCARDIOGRAM (TEE);  Surgeon: [name], MD;  Location: [facility] ENDOSCOPY;  Service: Endoscopy;  Laterality: N/A;    No family history on file.  No Known Allergies  Current Outpatient Medications on File Prior to Visit  Medication Sig Dispense Refill  . Heating Pads PADS Use over the counter heating pads as needed for back pain (Patient not taking: No sig reported) 1 Units 5   No current facility-administered medications on file prior to visit.    BP 130/80   Pulse (!) 55   Resp 18   Ht 5\' 8"  (1.727 m)   Wt 234 lb (106.1 kg)   SpO2 98%   BMI 35.58 kg/m      Objective:   Physical Exam  General Mental Status- Alert. General Appearance- Not in acute distress.   Skin General: Color- Normal Color. Moisture- Normal Moisture.  Neck Carotid Arteries- Normal color. Moisture- Normal Moisture. No carotid bruits. No JVD.  Chest and Lung Exam Auscultation: Breath Sounds:-Normal.  Cardiovascular Auscultation:Rythm-  Regular. Murmurs & Other Heart Sounds:Auscultation of the heart reveals- No Murmurs.  Abdomen Inspection:-Inspeection Normal. Palpation/Percussion:Note:No mass. Palpation and Percussion of the abdomen reveal- Non Tender, Non Distended + BS, no rebound or guarding.    Neurologic Cranial Nerve exam:- CN III-XII intact(No nystagmus), symmetric smile. Strength:- 5/5 equal and symmetric strength both upper and lower extremities.  Left upper extremity-on wrist manipulation does not affect numbness sensation in fifth fourth and third digit. Normal grip left hand. History of diabetes    Assessment & Plan:  With A1c in the 8 range 3 months ago. We discussed today how to titrate insulin from 10 units at night based on fasting blood sugars in the morning. I think this would be a better method and what you are doing presently. Patient confirmed understanding on titration of Lantus. Will follow A1c today.  History of hypertension and hyperlipidemia. Continue current lisinopril 20 mg daily. Continue atorvastatin. We will follow metabolic panel  and lipid panel today. Make adjustments if needed.  History of chronic back pain related to spinal stenosis, T9 compression fracture and osteomyelitis. Follow ID advice regarding discontinuing antibiotics. Went ahead and referred to spine specialist as you report pain is on the severe side and you do not tolerate sedation side effects of narcotics. We will follow metabolic panel/kidney function and might offer prescription of NSAID though that might irritate stomach. So we will proceed with caution.  History of GERD with vomiting intermittently. Concern that she might have some gastroparesis. I recommend eating healthy diet and will prescribe famotidine. Also referral to GI for EGD/possible gastroparesis.  Left side possible tenosynovitis has some crepitus on flexion and extension. Also digit numbness since surgical procedure to back. Will refer to sports  medication.  For abnormal ejaculation as you describe we will go ahead and refer you to urologist.  Follow-up in 6 weeks as you requested but sooner if needed.  Also recommend 73-month follow-up we can check your A1c and lipid panel.  [name], PA-C   Time spent with patient today was  50+ minutes which consisted of chart review, discussing diagnosis, work up treatment , answering all question thru interpretor and documentation.

## 2020-05-05 NOTE — Patient Instructions (Signed)
With A1c in the 8 range 3 months ago. We discussed today how to titrate insulin from 10 units at night based on fasting blood sugars in the morning. I think this would be a better method and what you are doing presently. Patient confirmed understanding on titration of Lantus. Will follow A1c today.  History of hypertension and hyperlipidemia. Continue current lisinopril 20 mg daily. Continue atorvastatin. We will follow metabolic panel and lipid panel today. Make adjustments if needed.  History of chronic back pain related to spinal stenosis, T9 compression fracture and osteomyelitis. Follow ID advice regarding discontinuing antibiotics. Went ahead and referred to spine specialist as you report pain is on the severe side and you do not tolerate sedation side effects of narcotics. We will follow metabolic panel/kidney function and might offer prescription of NSAID though that might irritate stomach. So we will proceed with caution.  History of GERD with vomiting intermittently. Concern that she might have some gastroparesis. I recommend eating healthy diet and will prescribe famotidine. Also referral to GI for EGD/possible gastroparesis.  Left side possible tenosynovitis has some crepitus on flexion and extension. Also digit numbness since surgical procedure to back. Will refer to sports medication.  For abnormal ejaculation as you describe we will go ahead and refer you to urologist.  Follow-up in 6 weeks as you requested but sooner if needed.  Also recommend 43-month follow-up we can check your A1c and lipid panel.

## 2020-05-16 DIAGNOSIS — H5213 Myopia, bilateral: Secondary | ICD-10-CM | POA: Diagnosis not present

## 2020-06-02 ENCOUNTER — Telehealth: Payer: Self-pay | Admitting: Medical

## 2020-06-06 ENCOUNTER — Other Ambulatory Visit: Payer: Self-pay

## 2020-06-06 ENCOUNTER — Ambulatory Visit: Payer: Medicaid Other | Admitting: Medical

## 2020-06-06 VITALS — BP 139/72 | HR 100 | Temp 98.2°F | Resp 20 | Ht 68.0 in | Wt 237.6 lb

## 2020-06-06 DIAGNOSIS — E1065 Type 1 diabetes mellitus with hyperglycemia: Secondary | ICD-10-CM | POA: Diagnosis not present

## 2020-06-06 DIAGNOSIS — R1110 Vomiting, unspecified: Secondary | ICD-10-CM

## 2020-06-06 DIAGNOSIS — I10 Essential (primary) hypertension: Secondary | ICD-10-CM

## 2020-06-06 DIAGNOSIS — Z01818 Encounter for other preprocedural examination: Secondary | ICD-10-CM

## 2020-06-06 DIAGNOSIS — K219 Gastro-esophageal reflux disease without esophagitis: Secondary | ICD-10-CM

## 2020-06-06 MED ORDER — LANTUS SOLOSTAR 100 UNIT/ML ~~LOC~~ SOPN: PEN_INJECTOR | SUBCUTANEOUS | 2 refills | Status: DC

## 2020-06-06 MED ORDER — OMEPRAZOLE 20 MG PO CPDR: 20.0000 mg | DELAYED_RELEASE_CAPSULE | Freq: Every day | ORAL | 0 refills | Status: DC

## 2020-06-06 NOTE — Patient Instructions (Addendum)
Our office needs to communicate with oral surgeon office to get info on procedure.  For gerd continue famotadine and will add omeprazoles. Will refer to gi md as well for evaluation of possible gastroparesis.  For htn continue on current meds lisinopril.  For diabetes not well controlled will refer to endocrinologist.   ekg showed pvc.Mild fast hr but no palpitation or chest pain history.  Follow up in 3 month or as needed

## 2020-06-06 NOTE — Progress Notes (Signed)
Subjective:    Patient ID: Thomas [name], male    DOB: 1975-09-13, 45 y.o.   MRN: [id number]  HPI   Pt in for preop exam.  He is going to get some dental extraction done in near future. Form sent to out office.  Pt states he is going to have all his teeth extracted. Pt friend states that all teeth will be extracted at same time under general anesthesia.  Not sure if will be done under in dentis office or in hospital. Dental form wants to know if he is optimized for out pt surgery.   Pt last a1c was 10.1. Pt is on lantus 34 units. But after breakfast today his blood sugar was 400.   On ekg today shows pvc. And mild tachycardia.   Pt medicaid will run out sometime next year.    Review of Systems  Constitutional: Negative for chills, fatigue and fever.  HENT: Negative for congestion and ear discharge.   Respiratory: Negative for cough, chest tightness, shortness of breath and wheezing.   Cardiovascular: Negative for chest pain.  Gastrointestinal: Negative for abdominal pain, blood in stool and constipation.  Genitourinary: Negative for dysuria.  Musculoskeletal: Negative for back pain and joint swelling.  Neurological: Negative for dizziness, speech difficulty, weakness, numbness and headaches.  Hematological: Negative for adenopathy. Does not bruise/bleed easily.  Psychiatric/Behavioral: Negative for behavioral problems and decreased concentration. The patient is not nervous/anxious.     Past Medical History:  Diagnosis Date  . Depression 01/27/2015  . Diabetes mellitus without complication (HCC)   . Hyperlipidemia   . Hypertension   . Peripheral vascular disease (HCC)      Social History   Socioeconomic History  . Marital status: Single    Spouse name: Not on file  . Number of children: Not on file  . Years of education: Not on file  . Highest education level: Not on file  Occupational History  . Not on file  Tobacco Use  . Smoking status: Current Every Day  Smoker    Packs/day: 1.00    Types: Cigarettes  . Smokeless tobacco: Former Neurosurgeon  . Tobacco comment: 5 cigarrettes a day  Vaping Use  . Vaping Use: Never used  Substance and Sexual Activity  . Alcohol use: No    Alcohol/week: 0.0 standard drinks  . Drug use: No  . Sexual activity: Not on file  Other Topics Concern  . Not on file  Social History Narrative  . Not on file   Social Determinants of Health   Financial Resource Strain: Not on file  Food Insecurity: Not on file  Transportation Needs: Not on file  Physical Activity: Not on file  Stress: Not on file  Social Connections: Not on file  Intimate Partner Violence: Not on file    Past Surgical History:  Procedure Laterality Date  . ABDOMINAL AORTOGRAM W/LOWER EXTREMITY Right 04/19/2019   Procedure: ABDOMINAL AORTOGRAM W/LOWER EXTREMITY;  Surgeon: [name], MD;  Location: [facility] INVASIVE CV LAB;  Service: Cardiovascular;  Laterality: Right;  . ABDOMINAL AORTOGRAM W/LOWER EXTREMITY Right 09/16/2019   Procedure: ABDOMINAL AORTOGRAM W/LOWER EXTREMITY;  Surgeon: [name], MD;  Location: [facility] INVASIVE CV LAB;  Service: Cardiovascular;  Laterality: Right;  . FEMORAL-TIBIAL BYPASS GRAFT Right 04/22/2019   Procedure: Right  FEMORAL-TIBIAL ARTERY BYPASS GRAFT with Harvest of Saphenous vein RIGHT SUPERFICIAL FEMORAL ARTERY TO TIBIOPERONEAL TRUNK BYPASS;  Surgeon: [name], MD;  Location: [facility] OR;  Service: Vascular;  Laterality: Right;  .  IR CERVICAL/THORACIC DISC ASPIRATION W/IMAG GUIDE  04/15/2019  . IR FLUORO GUIDED NEEDLE PLC ASPIRATION/INJECTION LOC  04/15/2019  . kidney stones  2009  . PERIPHERAL VASCULAR BALLOON ANGIOPLASTY Right 09/16/2019   Procedure: PERIPHERAL VASCULAR BALLOON ANGIOPLASTY;  Surgeon: [name], MD;  Location: [facility] INVASIVE CV LAB;  Service: Cardiovascular;  Laterality: Right;  ext iliac DCB  . PERIPHERAL VASCULAR INTERVENTION  04/19/2019   Procedure: PERIPHERAL VASCULAR  INTERVENTION;  Surgeon: [name], MD;  Location: [facility] INVASIVE CV LAB;  Service: Cardiovascular;;  right external iliac  . TEE WITHOUT CARDIOVERSION N/A 04/19/2019   Procedure: TRANSESOPHAGEAL ECHOCARDIOGRAM (TEE);  Surgeon: [name], MD;  Location: [facility] ENDOSCOPY;  Service: Endoscopy;  Laterality: N/A;    No family history on file.  No Known Allergies  Current Outpatient Medications on File Prior to Visit  Medication Sig Dispense Refill  . atorvastatin (LIPITOR) 10 MG tablet Take 1 tablet (10 mg total) by mouth daily at 6 PM. 30 tablet 2  . clopidogrel (PLAVIX) 75 MG tablet Take 1 tablet (75 mg total) by mouth daily with breakfast. 30 tablet 6  . famotidine (PEPCID) 20 MG tablet Take 1 tablet (20 mg total) by mouth 2 (two) times daily. 60 tablet 3  . Heating Pads PADS Use over the counter heating pads as needed for back pain 1 Units 5  . insulin glargine (LANTUS SOLOSTAR) 100 UNIT/ML Solostar Pen 10 units insulin at night sq. 15 mL 2  . lisinopril (ZESTRIL) 20 MG tablet Take 1 tablet (20 mg total) by mouth daily. 90 tablet 3   No current facility-administered medications on file prior to visit.    BP 139/72   Pulse 100   Temp 98.2 F (36.8 C) (Oral)   Resp 20   Ht 5\' 8"  (1.727 m)   Wt 237 lb 9.6 oz (107.8 kg)   SpO2 96%   BMI 36.13 kg/m       Objective:   Physical Exam  General Mental Status- Alert. General Appearance- Not in acute distress.   Skin General: Color- Normal Color. Moisture- Normal Moisture.  Neck Carotid Arteries- Normal color. Moisture- Normal Moisture. No carotid bruits. No JVD.  Chest and Lung Exam Auscultation: Breath Sounds:-Normal.  Cardiovascular Auscultation:Rythm- Regular. Murmurs & Other Heart Sounds:Auscultation of the heart reveals- No Murmurs.  Abdomen Inspection:-Inspeection Normal. Palpation/Percussion:Note:No mass. Palpation and Percussion of the abdomen reveal- Non Tender, Non Distended + BS, no rebound or  guarding.    Neurologic Cranial Nerve exam:- CN III-XII intact(No nystagmus), symmetric smile. Strength:- 5/5 equal and symmetric strength both upper and lower extremities.      Assessment & Plan:  Our office needs to communicate with oral surgeon office to get info on procedure.  For gerd continue famotadine and will add omeprazoles. Will refer to gi md as well for evaluation of possible gastroparesis.  For htn continue on current meds lisinopril.  For diabetes not well controlled will refer to endocrinologist.   ekg showed pvc.Mild fast hr but no palpitation or chest pain history.  Follow up in 3 month or as needed  , [employer]

## 2020-06-07 ENCOUNTER — Ambulatory Visit (INDEPENDENT_AMBULATORY_CARE_PROVIDER_SITE_OTHER): Payer: Medicaid Other | Admitting: Internal Medicine

## 2020-06-07 ENCOUNTER — Encounter: Payer: Self-pay | Admitting: Internal Medicine

## 2020-06-07 ENCOUNTER — Ambulatory Visit: Payer: Medicaid Other | Admitting: Medical

## 2020-06-07 DIAGNOSIS — M4620 Osteomyelitis of vertebra, site unspecified: Secondary | ICD-10-CM

## 2020-06-07 NOTE — Assessment & Plan Note (Signed)
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  He will remain off of antibiotics and follow-up in 6 weeks.

## 2020-06-07 NOTE — Progress Notes (Signed)
[facility]  Patient Active Problem List   Diagnosis Date Noted  . Discitis of thoracolumbar region     Priority: High  . MRSA bacteremia     Priority: High  . Endocarditis of mitral valve     Priority: High  . Gangrene of toe of right foot (HCC) 04/14/2019    Priority: High  . Osteomyelitis of spine (HCC) 04/13/2019    Priority: High  . PAD (peripheral artery disease) (HCC) 06/08/2019  . Hematoma 05/09/2019  . Rectal mass 04/14/2019  . DVT of popliteal vein (HCC) 04/14/2019  . Diabetes mellitus type II, uncontrolled (HCC) 01/27/2015  . Diabetic neuropathy (HCC) 01/27/2015  . HTN (hypertension) 01/27/2015  . Depression 01/27/2015  . Hyperlipidemia 01/27/2015    Patient's Medications  New Prescriptions   No medications on file  Previous Medications   ARGININE 500 MG TABLET    Take 500 mg by mouth daily.   ASHWAGANDHA PO    Take 800 mg by mouth.   ATORVASTATIN (LIPITOR) 10 MG TABLET    Take 1 tablet (10 mg total) by mouth daily at 6 PM.   ATORVASTATIN (LIPITOR) 10 MG TABLET    Take 10 mg by mouth daily.   CHOLECALCIFEROL (PA VITAMIN D-3) 125 MCG (5000 UT) CAPSULE    Take 5,000 Units by mouth daily.   CLOPIDOGREL (PLAVIX) 75 MG TABLET    Take 1 tablet (75 mg total) by mouth daily with breakfast.   CYANOCOBALAMIN (VITAMIN B-12) 5000 MCG TBDP    Take by mouth.   FAMOTIDINE (PEPCID) 20 MG TABLET    Take 1 tablet (20 mg total) by mouth 2 (two) times daily.   GLUCOSE 2 G CHEW    Chew by mouth.   HEATING PADS PADS    Use over the counter heating pads as needed for back pain   INSULIN GLARGINE (LANTUS SOLOSTAR) 100 UNIT/ML SOLOSTAR PEN    34 units at night   LISINOPRIL (ZESTRIL) 20 MG TABLET    Take 1 tablet (20 mg total) by mouth daily.   MULTIPLE VITAMIN (MULTIVITAMIN) CAPSULE    Take 1 capsule by mouth daily.   OMEGA-3 FATTY ACIDS (OMEGA-3 EPA FISH OIL PO)    Take 2,000 Units by mouth.   OMEPRAZOLE (PRILOSEC) 20 MG CAPSULE    Take 1 capsule  (20 mg total) by mouth daily.  Modified Medications   No medications on file  Discontinued Medications   DOXYCYCLINE (VIBRA-TABS) 100 MG TABLET    Take 100 mg by mouth 2 (two) times daily.    Subjective: Thomas [name] is in for his routine follow-up visit.  He completed a long course of doxycycline a little over 1 month ago for his recurrent MRSA thoracic spine infection.  He is still having back pain but states that overall he continues to improve slowly.  Review of Systems: Review of Systems  Constitutional: Negative for chills, diaphoresis and fever.  Musculoskeletal: Positive for back pain.    Past Medical History:  Diagnosis Date  . Depression 01/27/2015  . Diabetes mellitus without complication (HCC)   . Hyperlipidemia   . Hypertension   . Peripheral vascular disease (HCC)     Social History   Tobacco Use  . Smoking status: Current Every Day Smoker    Packs/day: 1.00    Types: Cigarettes  . Smokeless tobacco: Former Neurosurgeon  . Tobacco comment: 5 cigarrettes a day  Vaping Use  . Vaping Use: Never  used  Substance Use Topics  . Alcohol use: No    Alcohol/week: 0.0 standard drinks  . Drug use: No    No family history on file.  No Known Allergies  Objective: Vitals:   06/07/20 1003  BP: (!) 151/93  Pulse: (!) 101  Resp: 16  Temp: 98 F (36.7 C)  SpO2: 97%  Weight: 247 lb (112 kg)  Height: 5\' 8"  (1.727 m)   Body mass index is 37.56 kg/m.  Physical Exam Constitutional:      Comments: His spirits are good.  He was examined with the aid of the Arabic interpreter.  Cardiovascular:     Rate and Rhythm: Normal rate and regular rhythm.     Heart sounds: No murmur heard.   Pulmonary:     Effort: Pulmonary effort is normal.     Breath sounds: Normal breath sounds.  Musculoskeletal:     Comments: No spinal kyphotic deformity noted.  Psychiatric:        Mood and Affect: Mood normal.     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  He will remain off of antibiotics and follow-up in 6 weeks.          08/05/2019, MD [facility] [phone] pager   [phone] cell 06/07/2020, 10:14 AM

## 2020-06-11 ENCOUNTER — Telehealth: Payer: Self-pay | Admitting: Medical

## 2020-06-11 NOTE — Telephone Encounter (Signed)
Thomas [name] [phone].   I put in referral to GI, endocrinologist. And cardiologist. Would you call the above friend of patient. He speaks arabic. Typically in past when referred they don't go thru. I think pt does not return messages when spcialist calls him.  Please document that Thomas [name] was given numbers for each specialist. Explain to Thomas [name] to coordianate with pt calling specialist office.   He needs to see specialist before I can fill out clearance for oral surgery.

## 2020-06-12 ENCOUNTER — Telehealth: Payer: Self-pay | Admitting: Medical

## 2020-06-12 NOTE — Telephone Encounter (Signed)
Left message for clinical person to call back.

## 2020-06-12 NOTE — Telephone Encounter (Signed)
Would you  mind helping me out with this pt. I am in process of evaluating this pt  for oral surgery. Pt has diabetes, severe reflux, and pvc with tachcardia on ekg. Will get multiple teeth extractions.   I want to know what type of anesthesia oral surgeon plans on using. Is surgeon office planning on extracting all his teeth at one time or in separates procedurees? It will be in his office. Who is administrating anesthesia. Nurse anesthestitis, anesthisiologist or other? 

## 2020-06-13 NOTE — Telephone Encounter (Signed)
Spoke with Dr. Lillia Dallas.   He stated that if patient is stable enough based on our recommendation they would like to remove all teeth.  If patient is stable they will use propofoll, versad, and fentanyl and remove all teeth.   If patient is not they will use local anesthesia and get them out some at a time.   They have an anesthesiologist on staff there.

## 2020-06-13 NOTE — Telephone Encounter (Signed)
Left message with Aurther Loft at [facility] oral surgery place to call back

## 2020-06-20 ENCOUNTER — Encounter: Payer: Self-pay | Admitting: Internal Medicine

## 2020-06-20 ENCOUNTER — Telehealth: Payer: Self-pay | Admitting: Medical

## 2020-06-20 NOTE — Telephone Encounter (Signed)
Pt speaks arabic. Pt has friend Majeed who attends appointments and helps pt out. I have referred to Dr. [name] endocrine and GI at [place].  In the past all referrals fail as they leave message on [place] phone. Will you give Dr. [name] number [phone] to Majeed. GI number at [place] [phone].   Have Majeed call  Specialist  to help [facility]. I talked with him already. If you would give him the number. Majeed njmber [phone].

## 2020-06-20 NOTE — Telephone Encounter (Signed)
FYI

## 2020-06-20 NOTE — Telephone Encounter (Signed)
I am giving you a letter and his preop form will you fax both of those. Then give both back to [facility] Client to file or if you have a place to file to access in future.   Thanks,

## 2020-06-20 NOTE — Telephone Encounter (Signed)
Form faxed and given back to [facility]

## 2020-07-10 ENCOUNTER — Other Ambulatory Visit: Payer: Self-pay | Admitting: Medical

## 2020-07-19 ENCOUNTER — Encounter: Payer: Self-pay | Admitting: Internal Medicine

## 2020-07-19 ENCOUNTER — Ambulatory Visit (INDEPENDENT_AMBULATORY_CARE_PROVIDER_SITE_OTHER): Payer: Medicaid Other | Admitting: Internal Medicine

## 2020-07-19 ENCOUNTER — Other Ambulatory Visit: Payer: Self-pay

## 2020-07-19 DIAGNOSIS — M4620 Osteomyelitis of vertebra, site unspecified: Secondary | ICD-10-CM | POA: Diagnosis not present

## 2020-07-19 NOTE — Assessment & Plan Note (Signed)
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  He can follow-up here as needed.

## 2020-07-19 NOTE — Progress Notes (Signed)
[facility]  Patient Active Problem List   Diagnosis Date Noted  . Discitis of thoracolumbar region     Priority: High  . MRSA bacteremia     Priority: High  . Endocarditis of mitral valve     Priority: High  . Gangrene of toe of right foot (HCC) 04/14/2019    Priority: High  . Osteomyelitis of spine (HCC) 04/13/2019    Priority: High  . PAD (peripheral artery disease) (HCC) 06/08/2019  . Hematoma 05/09/2019  . Rectal mass 04/14/2019  . DVT of popliteal vein (HCC) 04/14/2019  . Diabetes mellitus type II, uncontrolled (HCC) 01/27/2015  . Diabetic neuropathy (HCC) 01/27/2015  . HTN (hypertension) 01/27/2015  . Depression 01/27/2015  . Hyperlipidemia 01/27/2015    Patient's Medications  New Prescriptions   No medications on file  Previous Medications   ARGININE 500 MG TABLET    Take 500 mg by mouth daily.   ASHWAGANDHA PO    Take 800 mg by mouth.   ATORVASTATIN (LIPITOR) 10 MG TABLET    Take 1 tablet (10 mg total) by mouth daily at 6 PM.   ATORVASTATIN (LIPITOR) 10 MG TABLET    Take 10 mg by mouth daily.   CHOLECALCIFEROL (PA VITAMIN D-3) 125 MCG (5000 UT) CAPSULE    Take 5,000 Units by mouth daily.   CLOPIDOGREL (PLAVIX) 75 MG TABLET    Take 1 tablet (75 mg total) by mouth daily with breakfast.   CYANOCOBALAMIN (VITAMIN B-12) 5000 MCG TBDP    Take by mouth.   FAMOTIDINE (PEPCID) 20 MG TABLET    Take 1 tablet (20 mg total) by mouth 2 (two) times daily.   GLUCOSE 2 G CHEW    Chew by mouth.   HEATING PADS PADS    Use over the counter heating pads as needed for back pain   INSULIN GLARGINE (LANTUS SOLOSTAR) 100 UNIT/ML SOLOSTAR PEN    34 units at night   LISINOPRIL (ZESTRIL) 20 MG TABLET    Take 1 tablet (20 mg total) by mouth daily.   MULTIPLE VITAMIN (MULTIVITAMIN) CAPSULE    Take 1 capsule by mouth daily.   OMEGA-3 FATTY ACIDS (OMEGA-3 EPA FISH OIL PO)    Take 2,000 Units by mouth.   OMEPRAZOLE (PRILOSEC) 20 MG CAPSULE    TAKE 1 CAPSULE  BY MOUTH DAILY  Modified Medications   No medications on file  Discontinued Medications   No medications on file    Subjective: Thomas [name] is in for his routine follow-up visit.  He completed a year-long course of antibiotics for recurrent MRSA bacteremia and vertebral infection on 04/26/2020.  He says that his back pain continues to improve.  He has not had any fever.  Since his last visit he has had all of his remaining upper teeth removed.  He is feeling better.  Review of Systems: Review of Systems  Constitutional: Negative for fever.  Gastrointestinal: Negative for abdominal pain, diarrhea, nausea and vomiting.  Musculoskeletal: Positive for back pain.    Past Medical History:  Diagnosis Date  . Depression 01/27/2015  . Diabetes mellitus without complication (HCC)   . Hyperlipidemia   . Hypertension   . Peripheral vascular disease (HCC)     Social History   Tobacco Use  . Smoking status: Current Every Day Smoker    Packs/day: 1.00    Types: Cigarettes  . Smokeless tobacco: Former Neurosurgeon  . Tobacco comment: 5 cigarrettes a day  Vaping Use  . Vaping Use: Never used  Substance Use Topics  . Alcohol use: No    Alcohol/week: 0.0 standard drinks  . Drug use: No    No family history on file.  No Known Allergies  Objective: Vitals:   07/19/20 0946  BP: (!) 149/91  Pulse: 99  Temp: 98 F (36.7 C)  Weight: 242 lb (109.8 kg)  Height: 5\' 8"  (1.727 m)   Body mass index is 36.8 kg/m.  Physical Exam Constitutional:      Comments: He is in good spirits.  He was examined with the aid of the Arabic interpreter.  HENT:     Mouth/Throat:     Comments: His upper teeth are missing.  Many lower teeth have been removed. Cardiovascular:     Rate and Rhythm: Normal rate and regular rhythm.     Heart sounds: No murmur heard.   Pulmonary:     Effort: Pulmonary effort is normal.     Breath sounds: Normal breath sounds.  Neurological:     General: No focal deficit present.      Gait: Gait normal.  Psychiatric:        Mood and Affect: Mood normal.     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  He can follow-up here as needed.          , MD [facility] [phone] pager   [phone] cell 07/19/2020, 10:19 AM

## 2020-08-03 ENCOUNTER — Other Ambulatory Visit: Payer: Self-pay

## 2020-08-03 ENCOUNTER — Telehealth: Payer: Self-pay | Admitting: Medical

## 2020-08-03 ENCOUNTER — Encounter: Payer: Self-pay | Admitting: Medical

## 2020-08-03 ENCOUNTER — Ambulatory Visit: Payer: Medicaid Other | Admitting: Medical

## 2020-08-03 VITALS — BP 138/80 | HR 102 | Temp 98.3°F | Resp 18 | Ht 68.0 in | Wt 242.0 lb

## 2020-08-03 DIAGNOSIS — E785 Hyperlipidemia, unspecified: Secondary | ICD-10-CM | POA: Diagnosis not present

## 2020-08-03 DIAGNOSIS — I10 Essential (primary) hypertension: Secondary | ICD-10-CM

## 2020-08-03 DIAGNOSIS — E1065 Type 1 diabetes mellitus with hyperglycemia: Secondary | ICD-10-CM

## 2020-08-03 DIAGNOSIS — I739 Peripheral vascular disease, unspecified: Secondary | ICD-10-CM | POA: Diagnosis not present

## 2020-08-03 DIAGNOSIS — K219 Gastro-esophageal reflux disease without esophagitis: Secondary | ICD-10-CM | POA: Diagnosis not present

## 2020-08-03 LAB — COMPREHENSIVE METABOLIC PANEL
ALT: 27 U/L (ref 0–53)
AST: 18 U/L (ref 0–37)
Albumin: 3.9 g/dL (ref 3.5–5.2)
Alkaline Phosphatase: 110 U/L (ref 39–117)
BUN: 15 mg/dL (ref 6–23)
CO2: 28 mEq/L (ref 19–32)
Calcium: 9 mg/dL (ref 8.4–10.5)
Chloride: 103 mEq/L (ref 96–112)
Creatinine, Ser: 1.03 mg/dL (ref 0.40–1.50)
GFR: 88.81 mL/min (ref >60.00)
Glucose, Bld: 174 mg/dL — ABNORMAL HIGH (ref 70–99)
Potassium: 4.7 mEq/L (ref 3.5–5.1)
Sodium: 138 mEq/L (ref 135–145)
Total Bilirubin: 0.5 mg/dL (ref 0.2–1.2)
Total Protein: 6.8 g/dL (ref 6.0–8.3)

## 2020-08-03 LAB — LIPID PANEL
Cholesterol: 179 mg/dL (ref 0–200)
HDL: 42.6 mg/dL (ref >39.00)
LDL Cholesterol: 108 mg/dL — ABNORMAL HIGH (ref 0–99)
NonHDL: 136.5
Total CHOL/HDL Ratio: 4
Triglycerides: 142 mg/dL (ref 0.0–149.0)
VLDL: 28.4 mg/dL (ref 0.0–40.0)

## 2020-08-03 LAB — HEMOGLOBIN A1C: Hgb A1c MFr Bld: 11.3 % — ABNORMAL HIGH (ref 4.6–6.5)

## 2020-08-03 MED ORDER — LISINOPRIL 20 MG PO TABS: 20.0000 mg | ORAL_TABLET | Freq: Every day | ORAL | 3 refills | Status: DC

## 2020-08-03 MED ORDER — OMEPRAZOLE 20 MG PO CPDR: 1.0000 | DELAYED_RELEASE_CAPSULE | Freq: Every day | ORAL | 11 refills | Status: DC

## 2020-08-03 MED ORDER — CLOPIDOGREL BISULFATE 75 MG PO TABS: 75.0000 mg | ORAL_TABLET | Freq: Every day | ORAL | 6 refills | Status: DC

## 2020-08-03 MED ORDER — LANTUS SOLOSTAR 100 UNIT/ML ~~LOC~~ SOPN: PEN_INJECTOR | SUBCUTANEOUS | 2 refills | Status: DC

## 2020-08-03 NOTE — Telephone Encounter (Signed)
Will you restart referral to endocrinologist and GI MD. Those referrals fell thru. I put in vascular surgeon referral today. I provided name of friend of his who speaks english and on DPR list. Please call specialist office and have them call Majeed so appointments can be scheduled.

## 2020-08-03 NOTE — Progress Notes (Signed)
Subjective:    Patient ID: Thomas [name], male    DOB: 12-28-1975, 45 y.o.   MRN: [id number]  HPI  Pt in for follow up.(Interview done with interpretor)  Pt did have dental extractions done.Pt is going to get dentures. Pt states that appointment will be May, 2022. Pt would like sooner appt to get the dentures. I explained to pt I can't make dentist move up fitting for dentures.  Pt has some gi symptoms/reflux like in past. Pt was having nausea lying supine. But since taking omeprazole that has resolved. I had referred to GI. But he never went.   Pt states spine infection resolved and saw spine specialist. No further appointment needed per pt.  Pt states at night his feet feel cold. Some cramping in his calfs when he walks. Hx of peripheral artery disease.   Pt is still smoking. 10 cigarettes a day. Pt used to be much heavier smoker. At one point 60 cigarretes a day. Smoker since 13 years. On average pack or more a day.  Pt is fasting. Pt is on lantus. Took 20 units in am and 14 units pm. A!C high in the past. Referred to endocrinologist. Referral fell thru. Message left with interpretor to schedule. Referral fell thru??   Majeed Hameed number [phone]. (name of person/fiend who will be on DPR.) Explained to pt want him to sign today so Majeed could help coordinate referral. He speaks english and is friend who has helped translate on prior visits.     Review of Systems  Constitutional: Negative for chills, fatigue and fever.  Respiratory: Negative for cough, chest tightness, shortness of breath and wheezing.   Cardiovascular: Negative for chest pain and palpitations.  Gastrointestinal: Negative for abdominal pain.  Musculoskeletal: Negative for back pain and neck stiffness.       See hpi regarding cramping in legs and cold feet.  Skin: Negative for rash.  Neurological: Negative for dizziness, weakness and numbness.  Hematological: Does not bruise/bleed easily.    Past  Medical History:  Diagnosis Date  . Depression 01/27/2015  . Diabetes mellitus without complication (HCC)   . Hyperlipidemia   . Hypertension   . Peripheral vascular disease (HCC)      Social History   Socioeconomic History  . Marital status: Single    Spouse name: Not on file  . Number of children: Not on file  . Years of education: Not on file  . Highest education level: Not on file  Occupational History  . Not on file  Tobacco Use  . Smoking status: Current Every Day Smoker    Packs/day: 1.00    Types: Cigarettes  . Smokeless tobacco: Former Neurosurgeon  . Tobacco comment: 5 cigarrettes a day  Vaping Use  . Vaping Use: Never used  Substance and Sexual Activity  . Alcohol use: No    Alcohol/week: 0.0 standard drinks  . Drug use: No  . Sexual activity: Not on file  Other Topics Concern  . Not on file  Social History Narrative  . Not on file   Social Determinants of Health   Financial Resource Strain: Not on file  Food Insecurity: Not on file  Transportation Needs: Not on file  Physical Activity: Not on file  Stress: Not on file  Social Connections: Not on file  Intimate Partner Violence: Not on file    Past Surgical History:  Procedure Laterality Date  . ABDOMINAL AORTOGRAM W/LOWER EXTREMITY Right 04/19/2019   Procedure: ABDOMINAL AORTOGRAM W/LOWER EXTREMITY;  Surgeon: [name], MD;  Location: [facility] INVASIVE CV LAB;  Service: Cardiovascular;  Laterality: Right;  . ABDOMINAL AORTOGRAM W/LOWER EXTREMITY Right 09/16/2019   Procedure: ABDOMINAL AORTOGRAM W/LOWER EXTREMITY;  Surgeon: [name], MD;  [facility] IN[facility] CV LAB;  Service: Cardiovascular;  Laterality: Right;  . FEMORAL-TIBIAL BYPASS GRAFT Right 04/22/2019   Procedure: Right  FEMORAL-TIBIAL ARTERY BYPASS GRAFT with Harvest of Saphenous vein RIGHT SUPERFICIAL FEMORAL ARTERY TO TIBIOPERONEAL TRUNK BYPASS;  Surgeon: [name], MD;  Location: [facility] OR;  Service: Vascular;  Laterality:  Right;  . IR CERVICAL/THORACIC DISC ASPIRATION W/IMAG GUIDE  04/15/2019  . IR FLUORO GUIDED NEEDLE PLC ASPIRATION/INJECTION LOC  04/15/2019  . kidney stones  2009  . PERIPHERAL VASCULAR BALLOON ANGIOPLASTY Right 09/16/2019   Procedure: PERIPHERAL VASCULAR BALLOON ANGIOPLASTY;  Surgeon: [name], MD;  Location: [facility] INVASIVE CV LAB;  Service: Cardiovascular;  Laterality: Right;  ext iliac DCB  . PERIPHERAL VASCULAR INTERVENTION  04/19/2019   Procedure: PERIPHERAL VASCULAR INTERVENTION;  Surgeon: [name], MD;  Location[facility] INVASIVE CV LAB;  Service: Cardiovascular;;  right external iliac  . TEE WITHOUT CARDIOVERSION N/A 04/19/2019   Procedure: TRANSESOPHAGEAL ECHOCARDIOGRAM (TEE);  Surgeon: [name], MD;  Location: [facility] ENDOSCOPY;  Service: Endoscopy;  Laterality: N/A;    No family history on file.  No Known Allergies  Current Outpatient Medications on File Prior to Visit  Medication Sig Dispense Refill  . atorvastatin (LIPITOR) 10 MG tablet Take 1 tablet (10 mg total) by mouth daily at 6 PM. 30 tablet 2  . atorvastatin (LIPITOR) 10 MG tablet Take 10 mg by mouth daily.    Marland Kitchen Heating Pads PADS Use over the counter heating pads as needed for back pain 1 Units 5  . Omega-3 Fatty Acids (OMEGA-3 EPA FISH OIL PO) Take 2,000 Units by mouth.     No current facility-administered medications on file prior to visit.    BP 138/80   Pulse (!) 102   Temp 98.3 F (36.8 C)   Resp 18   Ht 5\' 8"  (1.727 m)   Wt 242 lb (109.8 kg)   SpO2 96%   BMI 36.80 kg/m       Objective:   Physical Exam  General Mental Status- Alert. General Appearance- Not in acute distress.   Skin General: Color- Normal Color. Moisture- Normal Moisture.  Neck Carotid Arteries- Normal color. Moisture- Normal Moisture. No carotid bruits. No JVD.  Chest and Lung Exam Auscultation: Breath Sounds:-Normal.  Cardiovascular Auscultation:Rythm- Regular. Murmurs & Other Heart  Sounds:Auscultation of the heart reveals- No Murmurs.  Abdomen Inspection:-Inspeection Normal. Palpation/Percussion:Note:No mass. Palpation and Percussion of the abdomen reveal- Non Tender, Non Distended + BS, no rebound or guarding.    Neurologic Cranial Nerve exam:- CN III-XII intact(No nystagmus), symmetric smile. Strength:- 5/5 equal and symmetric strength both upper and lower extremities.  Lower ext- both feet feel normal temp. No breakdown or wounds to feet. Normal color. I think can feel pulses.     Assessment & Plan:  History of hypertension and blood pressure recently controlled but not ideal presently.  Continue lisinopril 20 mg daily.  Ideally would like blood pressure be closer to 130/80.  If on next BP check still a little bit borderline high then will increase dose to 40 mg daily.  History of GERD that was described severe on last visit but since using omeprazole daily symptoms have resolved.  We will try to attempt referral to GI again since I  do have some concern that he might have some gastroparesis possibly contributing to history of severe reflux.  History of diabetes with high A1c.  Continue Lantus.  We will get A1c today again.  I am going to try to get you in with endocrinologist again.   History of hyperlipidemia.  We will get metabolic panel and lipid panel today.  After review of labs might need to increase dose of atorvastatin.  History of heavy smoking with peripheral vascular disease.  Please stop smoking.  We will go ahead and refer you to vascular surgeon who operated on you previously while you are in the hospital.  Having you signed DPR today to have your friend Majeed Hameed available to help out with referrals.  Thus far every referral I put in has not gone through.  I think they are calling your number directly and you not calling them back.  Please coordinate with your friend to make sure your referrals get done.  [name], PA-C   Time spent with  patient today was 55  minutes which consisted of chart review, discussing diagnoses, work up, referrals, treatment and documentation.

## 2020-08-03 NOTE — Patient Instructions (Addendum)
History of hypertension and blood pressure recently controlled but not ideal presently.  Continue lisinopril 20 mg daily.  Ideally would like blood pressure be closer to 130/80.  If on next BP check still a little bit borderline high then will increase dose to 40 mg daily.  History of GERD that was described severe on last visit but since using omeprazole daily symptoms have resolved.  We will try to attempt referral to GI again since I do have some concern that he might have some gastroparesis possibly contributing to history of severe reflux.  History of diabetes with high A1c.  Continue Lantus.  We will get A1c today again.  I am going to try to get you in with endocrinologist again.   History of hyperlipidemia.  We will get metabolic panel and lipid panel today.  After review of labs might need to increase dose of atorvastatin.  History of heavy smoking with peripheral vascular disease.  Please stop smoking.  We will go ahead and refer you to vascular surgeon who operated on you previously while you are in the hospital.  Having you signed DPR today to have your friend Majeed Hameed available to help out with referrals.  Thus far every referral I put in has not gone through.  I think they are calling your number directly and you not calling them back.  Please coordinate with your friend to make sure your referrals get done.

## 2020-08-04 ENCOUNTER — Telehealth: Payer: Self-pay | Admitting: Medical

## 2020-08-04 ENCOUNTER — Other Ambulatory Visit: Payer: Self-pay | Admitting: Medical

## 2020-08-04 MED ORDER — ATORVASTATIN CALCIUM 10 MG PO TABS: 10.0000 mg | ORAL_TABLET | Freq: Every day | ORAL | 11 refills | Status: DC

## 2020-08-04 MED ORDER — LANTUS SOLOSTAR 100 UNIT/ML ~~LOC~~ SOPN: PEN_INJECTOR | SUBCUTANEOUS | 2 refills | Status: DC

## 2020-08-04 NOTE — Telephone Encounter (Signed)
Please sent to a new pharmacy  Medication: insulin glargine (LANTUS SOLOSTAR) 100 UNIT/ML Solostar Pen [[id number]]        Has the patient contacted their pharmacy?  (If no, request that the patient contact the pharmacy for the refill.) no (If yes, when and what did the pharmacy advise?)     Preferred Pharmacy (with phone number or street name):  [facility] - [place], [place] - [address] Phone:  [phone]  Fax:  [phone]          Agent: Please be advised that RX refills may take up to 3 business days. We ask that you follow-up with your pharmacy.

## 2020-08-04 NOTE — Telephone Encounter (Signed)
Rx faxed to health department

## 2020-08-04 NOTE — Telephone Encounter (Signed)
Rx atorvastatin sent to pt pharmacy. 

## 2020-08-07 ENCOUNTER — Other Ambulatory Visit (HOSPITAL_COMMUNITY): Payer: Self-pay | Admitting: Vascular Surgery

## 2020-08-07 DIAGNOSIS — I739 Peripheral vascular disease, unspecified: Secondary | ICD-10-CM

## 2020-08-08 ENCOUNTER — Encounter: Payer: Self-pay | Admitting: Vascular Surgery

## 2020-08-08 ENCOUNTER — Other Ambulatory Visit: Payer: Self-pay

## 2020-08-08 ENCOUNTER — Ambulatory Visit (INDEPENDENT_AMBULATORY_CARE_PROVIDER_SITE_OTHER)
Admission: RE | Admit: 2020-08-08 | Discharge: 2020-08-08 | Disposition: A | Discharge status: Home / Self Care | Payer: Medicaid Other | Source: Ambulatory Visit | Attending: Vascular Surgery | Admitting: Vascular Surgery

## 2020-08-08 ENCOUNTER — Ambulatory Visit (HOSPITAL_COMMUNITY)
Admission: RE | Admit: 2020-08-08 | Discharge: 2020-08-08 | Disposition: A | Discharge status: Home / Self Care | Payer: Medicaid Other | Source: Ambulatory Visit | Attending: Vascular Surgery | Admitting: Vascular Surgery

## 2020-08-08 ENCOUNTER — Ambulatory Visit (INDEPENDENT_AMBULATORY_CARE_PROVIDER_SITE_OTHER): Payer: Medicaid Other | Admitting: Vascular Surgery

## 2020-08-08 ENCOUNTER — Encounter (HOSPITAL_COMMUNITY): Payer: Medicaid Other

## 2020-08-08 VITALS — BP 149/83 | HR 99 | Temp 97.5°F | Resp 18 | Ht 69.0 in | Wt 241.0 lb

## 2020-08-08 DIAGNOSIS — I739 Peripheral vascular disease, unspecified: Secondary | ICD-10-CM

## 2020-08-08 NOTE — Progress Notes (Signed)
Patient name: Charlton Boule MRN: [id number] DOB: [birth year] Sex: male  REASON FOR VISIT: Bilateral cold feet and new bilateral calf claudication  HPI: Kito Cuffe is a 45 y.o. male with history of diabetes, hypertension, hyperlipidemia that presents for evaluation of new bilateral lower extremity calf claudication that started several weeks ago.  States he gets pain in both calves when he walks and this stops when he rests.  He has no new tissue loss.  He is smoking about 10 cigarettes a day.  Still taking his Plavix.  He is well-known to vascular surgery.  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  Ultimately underwent right external iliac angioplasty and stent on 04/19/2019.  Subsequently got a right above-knee popliteal artery to TP trunk bypass with reversed great saphenous vein on 04/22/2019.  All of his toes eventually healed.  Past Medical History:  Diagnosis Date  . Depression 01/27/2015  . Diabetes mellitus without complication (HCC)   . Hyperlipidemia   . Hypertension   . Peripheral vascular disease [facility])     Past Surgical History:  Procedure Laterality Date  . ABDOMINAL AORTOGRAM W/LOWER EXTREMITY Right 04/19/2019   Procedure: ABDOMINAL AORTOGRAM W/LOWER EXTREMITY;  Surgeon: [name], MD;  Location: [facility] INVASIVE CV LAB;  Service: Cardiovascular;  Laterality: Right;  . ABDOMINAL AORTOGRAM W/LOWER EXTREMITY Right 09/16/2019   Procedure: ABDOMINAL AORTOGRAM W/LOWER EXTREMITY;  Surgeon: [name], MD;  Location: [facility] INVASIVE CV LAB;  Service: Cardiovascular;  Laterality: Right;  . FEMORAL-TIBIAL BYPASS GRAFT Right 04/22/2019   Procedure: Right  FEMORAL-TIBIAL ARTERY BYPASS GRAFT with Harvest of Saphenous vein RIGHT SUPERFICIAL FEMORAL ARTERY TO TIBIOPERONEAL TRUNK BYPASS;  Surgeon: [name], MD;  Location: [facility] OR;  Service: Vascular;  Laterality: Right;  . IR CERVICAL/THORACIC DISC ASPIRATION  W/IMAG GUIDE  04/15/2019  . IR FLUORO GUIDED NEEDLE PLC ASPIRATION/INJECTION LOC  04/15/2019  . kidney stones  2009  . PERIPHERAL VASCULAR BALLOON ANGIOPLASTY Right 09/16/2019   Procedure: PERIPHERAL VASCULAR BALLOON ANGIOPLASTY;  Surgeon: [name], MD;  Location: [facility] INVASIVE CV LAB;  Service: Cardiovascular;  Laterality: Right;  ext iliac DCB  . PERIPHERAL VASCULAR INTERVENTION  04/19/2019   Procedure: PERIPHERAL VASCULAR INTERVENTION;  Surgeon: [name], MD;  Location: [facility] INVASIVE CV LAB;  Service: Cardiovascular;;  right external iliac  . TEE WITHOUT CARDIOVERSION N/A 04/19/2019   Procedure: TRANSESOPHAGEAL ECHOCARDIOGRAM (TEE);  Surgeon: [name], MD;  Location: [facility] ENDOSCOPY;  Service: Endoscopy;  Laterality: N/A;    History reviewed. No pertinent family history.  SOCIAL HISTORY: Social History   Tobacco Use  . Smoking status: Current Every Day Smoker    Packs/day: 1.00    Types: Cigarettes  . Smokeless tobacco: Former Neurosurgeon  . Tobacco comment: 5 cigarrettes a day  Substance Use Topics  . Alcohol use: No    Alcohol/week: 0.0 standard drinks    No Known Allergies  Current Outpatient Medications  Medication Sig Dispense Refill  . atorvastatin (LIPITOR) 10 MG tablet Take 1 tablet (10 mg total) by mouth daily at 6 PM. 30 tablet 2  . atorvastatin (LIPITOR) 10 MG tablet Take 1 tablet (10 mg total) by mouth daily. 30 tablet 11  . clopidogrel (PLAVIX) 75 MG tablet Take 1 tablet (75 mg total) by mouth daily with breakfast. 30 tablet 6  . insulin glargine (LANTUS SOLOSTAR) 100 UNIT/ML Solostar Pen 34 units at night 15 mL 2  . lisinopril (ZESTRIL) 20 MG tablet Take  1 tablet (20 mg total) by mouth daily. 90 tablet 3  . Omega-3 Fatty Acids (OMEGA-3 EPA FISH OIL PO) Take 2,000 Units by mouth.    Marland Kitchen omeprazole (PRILOSEC) 20 MG capsule Take 1 capsule (20 mg total) by mouth daily. 30 capsule 11  . Heating Pads PADS Use over the counter heating pads as needed  for back pain (Patient not taking: Reported on 08/08/2020) 1 Units 5   No current facility-administered medications for this visit.    REVIEW OF SYSTEMS:  [X]  denotes positive finding, [ ]  denotes negative finding Cardiac  Comments:  Chest pain or chest pressure:    Shortness of breath upon exertion:    Short of breath when lying flat:    Irregular heart rhythm:        Vascular    Pain in calf, thigh, or hip brought on by ambulation:    Pain in feet at night that wakes you up from your sleep:     Blood clot in your veins:    Leg swelling:         Pulmonary    Oxygen at home:    Productive cough:     Wheezing:         Neurologic    Sudden weakness in arms or legs:     Sudden numbness in arms or legs:     Sudden onset of difficulty speaking or slurred speech:    Temporary loss of vision in one eye:     Problems with dizziness:         Gastrointestinal    Blood in stool:     Vomited blood:         Genitourinary    Burning when urinating:     Blood in urine:        Psychiatric    Major depression:         Hematologic    Bleeding problems:    Problems with blood clotting too easily:        Skin    Rashes or ulcers:        Constitutional    Fever or chills:      PHYSICAL EXAM: Vitals:   08/08/20 0944  BP: (!) 149/83  Pulse: 99  Resp: 18  Temp: (!) 97.5 F (36.4 C)  TempSrc: Temporal  SpO2: 96%  Weight: 241 lb (109.3 kg)  Height: 5\' 9"  (1.753 m)    GENERAL: The patient is a well-nourished male, in no acute distress. The vital signs are documented above. CARDIAC: There is a regular rate and rhythm.  VASCULAR:  Palpable bilateral femoral pulse Brisk right PT signal  DATA:   ABIs today are 0.89 on the right biphasic and 0.62 on the left monophasic  Right external iliac stent has a greater than 50% stenosis with a velocity of 314 on iliac duplex today   Assessment/Plan:  45 y.o. male status post remote right leg revascularization presents with new  bilateral lower extremity short distance lifestyle limiting calf claudication.  Previously underwent right external iliac angioplasty and stent on 04/19/2019 and then a right above-knee popliteal artery to TP trunk bypass with reversed great saphenous vein on 04/22/2019 for tissue loss - all of his wounds eventually healed.  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  Discussed would recommend aortogram with lower extremity arteriogram and possible intervention.  My initial focus will be on the right leg given he does appear  to have a stenosis in his right external iliac stent that certainly could be explain his new calf claudication.  Discussed will also evaluate left leg runoff and make decisions about left leg intervention at a later time.  Risks and benefits discussed.   [name], MD [facility] Office: [phone]

## 2020-08-10 ENCOUNTER — Telehealth: Payer: Self-pay | Admitting: Medical

## 2020-08-10 NOTE — Telephone Encounter (Signed)
Majeed Hameed number [phone]. (name of person/fiend who will be on DPR.)   I have referred pt to endocrinologist and GI MD.  Various referrals have fallen thru. Not sure why. I think language barrier. Pt friend speaks english. Will you call him and give him numbers to Dr. [name] office and Dr. Chales Abrahams office. Ask him to please call on behalf of pt to help coordinate referral.   Please review most recent referral nots. Somehow he got scheduled with vascular MD. 1 out of 3. Need 3 out of 3.  See [place] note.

## 2020-08-11 NOTE — Telephone Encounter (Signed)
Left detailed message on machine.    Looks like he just needs to get appointment with GI.  Appointment for Endo is scheduled for 09/15/20.    Patient just needs to call GI to schedule.    Left number for GI for them to call to schedule.  I will try back Monday to speak with Majeed.

## 2020-08-15 NOTE — Telephone Encounter (Signed)
Spoke with Majeed and gave number to GI they will call to get scheduled.

## 2020-08-15 NOTE — Telephone Encounter (Signed)
Thanks for helping out with this pt. Can you also give him information to Dr. [name] office. That referral has fell thru some how as well.

## 2020-08-16 ENCOUNTER — Other Ambulatory Visit (HOSPITAL_COMMUNITY)
Admission: RE | Admit: 2020-08-16 | Discharge: 2020-08-16 | Disposition: A | Discharge status: Home / Self Care | Payer: Medicaid Other | Source: Ambulatory Visit | Attending: Vascular Surgery | Admitting: Vascular Surgery

## 2020-08-16 DIAGNOSIS — Z20822 Contact with and (suspected) exposure to covid-19: Secondary | ICD-10-CM | POA: Insufficient documentation

## 2020-08-16 DIAGNOSIS — Z01812 Encounter for preprocedural laboratory examination: Secondary | ICD-10-CM | POA: Diagnosis not present

## 2020-08-16 LAB — SARS CORONAVIRUS 2 (TAT 6-24 HRS): SARS Coronavirus 2: NEGATIVE

## 2020-08-16 NOTE — Telephone Encounter (Signed)
He has an appointment on 6/3 with her.

## 2020-08-16 NOTE — Telephone Encounter (Signed)
Okay thanks for your help

## 2020-08-17 ENCOUNTER — Ambulatory Visit (HOSPITAL_COMMUNITY)
Admission: RE | Disposition: A | Discharge status: Home / Self Care | Payer: Self-pay | Source: Home / Self Care | Attending: Vascular Surgery

## 2020-08-17 ENCOUNTER — Other Ambulatory Visit: Payer: Self-pay

## 2020-08-17 ENCOUNTER — Ambulatory Visit (HOSPITAL_COMMUNITY)
Admission: RE | Admit: 2020-08-17 | Discharge: 2020-08-17 | Disposition: A | Discharge status: Home / Self Care | Payer: Medicaid Other | Attending: Vascular Surgery | Admitting: Vascular Surgery

## 2020-08-17 DIAGNOSIS — E785 Hyperlipidemia, unspecified: Secondary | ICD-10-CM | POA: Insufficient documentation

## 2020-08-17 DIAGNOSIS — Z7902 Long term (current) use of antithrombotics/antiplatelets: Secondary | ICD-10-CM | POA: Insufficient documentation

## 2020-08-17 DIAGNOSIS — T82858A Stenosis of vascular prosthetic devices, implants and grafts, initial encounter: Secondary | ICD-10-CM | POA: Insufficient documentation

## 2020-08-17 DIAGNOSIS — I10 Essential (primary) hypertension: Secondary | ICD-10-CM | POA: Diagnosis not present

## 2020-08-17 DIAGNOSIS — F17210 Nicotine dependence, cigarettes, uncomplicated: Secondary | ICD-10-CM | POA: Diagnosis not present

## 2020-08-17 DIAGNOSIS — I70213 Atherosclerosis of native arteries of extremities with intermittent claudication, bilateral legs: Secondary | ICD-10-CM

## 2020-08-17 DIAGNOSIS — Y832 Surgical operation with anastomosis, bypass or graft as the cause of abnormal reaction of the patient, or of later complication, without mention of misadventure at the time of the procedure: Secondary | ICD-10-CM | POA: Insufficient documentation

## 2020-08-17 DIAGNOSIS — Z794 Long term (current) use of insulin: Secondary | ICD-10-CM | POA: Insufficient documentation

## 2020-08-17 DIAGNOSIS — E1151 Type 2 diabetes mellitus with diabetic peripheral angiopathy without gangrene: Secondary | ICD-10-CM | POA: Diagnosis present

## 2020-08-17 DIAGNOSIS — Z79899 Other long term (current) drug therapy: Secondary | ICD-10-CM | POA: Insufficient documentation

## 2020-08-17 HISTORY — PX: PERIPHERAL VASCULAR BALLOON ANGIOPLASTY: CATH118281

## 2020-08-17 HISTORY — PX: ABDOMINAL AORTOGRAM W/LOWER EXTREMITY: CATH118223

## 2020-08-17 LAB — POCT I-STAT, CHEM 8
BUN: 26 mg/dL — ABNORMAL HIGH (ref 6–20)
Calcium, Ion: 1.2 mmol/L (ref 1.15–1.40)
Chloride: 103 mmol/L (ref 98–111)
Creatinine, Ser: 1 mg/dL (ref 0.61–1.24)
Glucose, Bld: 220 mg/dL — ABNORMAL HIGH (ref 70–99)
HCT: 45 % (ref 39.0–52.0)
Hemoglobin: 15.3 g/dL (ref 13.0–17.0)
Potassium: 4.4 mmol/L (ref 3.5–5.1)
Sodium: 138 mmol/L (ref 135–145)
TCO2: 23 mmol/L (ref 22–32)

## 2020-08-17 LAB — GLUCOSE, CAPILLARY: Glucose-Capillary: 143 mg/dL — ABNORMAL HIGH (ref 70–99)

## 2020-08-17 LAB — POCT ACTIVATED CLOTTING TIME
Activated Clotting Time: 220 seconds
Activated Clotting Time: 172 seconds

## 2020-08-17 SURGERY — ABDOMINAL AORTOGRAM W/LOWER EXTREMITY
Anesthesia: LOCAL

## 2020-08-17 MED ORDER — CLOPIDOGREL BISULFATE 75 MG PO TABS: 75.0000 mg | ORAL_TABLET | Freq: Every day | ORAL | Status: DC

## 2020-08-17 MED ORDER — CLOPIDOGREL BISULFATE 75 MG PO TABS
ORAL_TABLET | ORAL | Status: AC
  Filled 2020-08-17: qty 1

## 2020-08-17 MED ORDER — LABETALOL HCL 5 MG/ML IV SOLN: 10.0000 mg | INTRAVENOUS | Status: DC | PRN

## 2020-08-17 MED ORDER — SODIUM CHLORIDE 0.9% FLUSH: 3.0000 mL | INTRAVENOUS | Status: DC | PRN

## 2020-08-17 MED ORDER — HEPARIN (PORCINE) IN NACL 1000-0.9 UT/500ML-% IV SOLN
INTRAVENOUS | Status: DC | PRN
  Administered 2020-08-17 (×2): 500 mL

## 2020-08-17 MED ORDER — SODIUM CHLORIDE 0.9% FLUSH: 3.0000 mL | Freq: Two times a day (BID) | INTRAVENOUS | Status: DC

## 2020-08-17 MED ORDER — SODIUM CHLORIDE 0.9 % IV SOLN: INTRAVENOUS | Status: AC

## 2020-08-17 MED ORDER — ASPIRIN EC 81 MG PO TBEC: 81.0000 mg | DELAYED_RELEASE_TABLET | Freq: Every day | ORAL | Status: DC

## 2020-08-17 MED ORDER — LIDOCAINE HCL (PF) 1 % IJ SOLN
INTRAMUSCULAR | Status: AC
  Filled 2020-08-17: qty 30

## 2020-08-17 MED ORDER — ASPIRIN EC 81 MG PO TBEC: 81.0000 mg | DELAYED_RELEASE_TABLET | Freq: Every day | ORAL | 0 refills | Status: AC

## 2020-08-17 MED ORDER — IODIXANOL 320 MG/ML IV SOLN
INTRAVENOUS | Status: DC | PRN
  Administered 2020-08-17: 85 mL

## 2020-08-17 MED ORDER — HYDRALAZINE HCL 20 MG/ML IJ SOLN: 5.0000 mg | INTRAMUSCULAR | Status: DC | PRN

## 2020-08-17 MED ORDER — MIDAZOLAM HCL 2 MG/2ML IJ SOLN
INTRAMUSCULAR | Status: AC
  Filled 2020-08-17: qty 2

## 2020-08-17 MED ORDER — MIDAZOLAM HCL 2 MG/2ML IJ SOLN
INTRAMUSCULAR | Status: DC | PRN
  Administered 2020-08-17: 2 mg via INTRAVENOUS

## 2020-08-17 MED ORDER — SODIUM CHLORIDE 0.9 % IV SOLN: 250.0000 mL | INTRAVENOUS | Status: DC | PRN

## 2020-08-17 MED ORDER — FENTANYL CITRATE (PF) 100 MCG/2ML IJ SOLN
INTRAMUSCULAR | Status: DC | PRN
  Administered 2020-08-17: 50 ug via INTRAVENOUS

## 2020-08-17 MED ORDER — LIDOCAINE HCL (PF) 1 % IJ SOLN
INTRAMUSCULAR | Status: DC | PRN
  Administered 2020-08-17: 12 mL

## 2020-08-17 MED ORDER — HEPARIN SODIUM (PORCINE) 1000 UNIT/ML IJ SOLN
INTRAMUSCULAR | Status: DC | PRN
  Administered 2020-08-17: 10000 [IU] via INTRAVENOUS

## 2020-08-17 MED ORDER — FENTANYL CITRATE (PF) 100 MCG/2ML IJ SOLN
INTRAMUSCULAR | Status: AC
  Filled 2020-08-17: qty 2

## 2020-08-17 MED ORDER — SODIUM CHLORIDE 0.9 % IV SOLN: INTRAVENOUS | Status: DC

## 2020-08-17 MED ORDER — ACETAMINOPHEN 325 MG PO TABS: 650.0000 mg | ORAL_TABLET | ORAL | Status: DC | PRN

## 2020-08-17 MED ORDER — ONDANSETRON HCL 4 MG/2ML IJ SOLN: 4.0000 mg | Freq: Four times a day (QID) | INTRAMUSCULAR | Status: DC | PRN

## 2020-08-17 MED ORDER — CLOPIDOGREL BISULFATE 300 MG PO TABS
ORAL_TABLET | ORAL | Status: DC | PRN
  Administered 2020-08-17: 75 mg via ORAL

## 2020-08-17 MED ORDER — HEPARIN (PORCINE) IN NACL 1000-0.9 UT/500ML-% IV SOLN
INTRAVENOUS | Status: AC
  Filled 2020-08-17: qty 1000

## 2020-08-17 MED ORDER — HEPARIN SODIUM (PORCINE) 1000 UNIT/ML IJ SOLN
INTRAMUSCULAR | Status: AC
  Filled 2020-08-17: qty 1

## 2020-08-17 SURGICAL SUPPLY — 16 items
BALLN JADE .018 3.0 X 20 (BALLOONS) ×3
BALLOON JADE .018 3.0 X 20 (BALLOONS) ×2 IMPLANT
CATH CXI SUPP 2.6F 150 ANG (CATHETERS) ×3 IMPLANT
CATH OMNI FLUSH 5F 65CM (CATHETERS) ×3 IMPLANT
KIT ENCORE 26 ADVANTAGE (KITS) ×3 IMPLANT
KIT MICROPUNCTURE NIT STIFF (SHEATH) ×3 IMPLANT
KIT PV (KITS) ×3 IMPLANT
SHEATH FLEX ANSEL ANG 6F 45CM (SHEATH) ×3 IMPLANT
SHEATH PINNACLE 5F 10CM (SHEATH) ×3 IMPLANT
SHEATH PROBE COVER 6X72 (BAG) ×3 IMPLANT
SYR MEDRAD MARK 7 150ML (SYRINGE) ×3 IMPLANT
TRANSDUCER W/STOPCOCK (MISCELLANEOUS) ×3 IMPLANT
TRAY PV CATH (CUSTOM PROCEDURE TRAY) ×3 IMPLANT
WIRE G V18X300CM (WIRE) ×6 IMPLANT
WIRE ROSEN-J .035X180CM (WIRE) ×3 IMPLANT
WIRE STARTER BENTSON 035X150 (WIRE) ×3 IMPLANT

## 2020-08-17 NOTE — Op Note (Signed)
Patient name: Thomas [name] MRN: [id number] DOB: [birth year] Sex: male  08/17/2020 Pre-operative Diagnosis: Bilateral lower extremity short distance lifestyle limiting claudication after remote right leg revascularization Post-operative diagnosis:  Same Surgeon:  [name], MD Procedure Performed: 1.  Ultrasound-guided access of left common femoral artery 2.  Aortogram including catheter selection of aorta 3.  Bilateral lower extremity arteriogram with selection of third order branches in the right lower extremity 4.  Right posterior tibial artery and TP trunk angioplasty (3 mm x 20 mm Jade) 5.  64 minutes of monitored moderate conscious sedation time  Indications:    45 year old male who is well-known to vascular surgery who previously underwent a right external iliac stent and then a right above knee popliteal artery to TP trunk bypass with saphenous vein in 2021 for critical limb ischemia with tissue loss.  All of his wounds in the right foot have healed and his bypass remains patent.  He was seen in the office last week with recurrent bilateral lower extremity claudication and presents for arteriogram with possible intervention.  Risk benefits discussed.  Findings:   Aortogram showed a patent infrarenal aorta with patent iliac segments bilaterally.  The right external iliac stent was evaluated in multiple views and there was no evidence of significant stenosis.  Right lower extremity runoff showed a patent common femoral, profunda, SFA as well as a widely patent above-knee popliteal to TP trunk bypass.  Patient had a high-grade stenosis greater than 80% in the TP trunk distally and in the proximal PT artery which is the dominant runoff.  The peroneal was also patent although very diminutive.  Ultimately I was able to put buddy wires down the PT and the peroneal.  The TP trunk and proximal PT lesions were then angioplastied with 3 mm Jade to nominal pressure for 3 minutes with  excellent results and preserved runoff in the right lower extremity down the PT and peroneal.  Left lower extremity arteriogram showed a patent common femoral and profunda with a high-grade mid SFA stenosis and also patent above and below-knee popliteal artery.  The tibial trifurcation appears occluded although there are stumps visible.  He does reconstitute a PT and peroneal in the left lower extremity.   Procedure:  The patient was identified in the holding area and taken to room 8.  The patient was then placed supine on the table and prepped and draped in the usual sterile fashion.  A time out was called.  Ultrasound was used to evaluate the left common femoral artery.  It was patent .  A digital ultrasound image was acquired.  A micropuncture needle was used to access the left common femoral artery under ultrasound guidance.  An 018 wire was advanced without resistance and a micropuncture sheath was placed.  The 018 wire was removed and a benson wire was placed.  The micropuncture sheath was exchanged for a 5 french sheath.  An omniflush catheter was advanced over the wire to the level of L-1.  An abdominal angiogram was obtained.  Next the catheter was pulled down and bilateral lower extremity runoff was obtained.  After evaluating the right leg images elected to intervene on the tibial disease distal to the bypass.  I then used a Bentson wire and Omni Flush catheter to select the right iliac and got my wire down the right SFA where we advanced the Omni Flush catheter.  I then placed a Rosen right in the right SFA and exchanged for a 6  [place] Ansell sheath in the left groin over the aortic bifurcation.  Patient was given 100 units as per kilogram heparin IV.  I then initially used a CXI catheter with a V 18 and got a wire down across the bypass and into the posterior tibial artery across the stenosis.  I then placed a second buddy wire down the peroneal in case I lost flow down the peroneal while  angioplasty in the TP trunk and PT artery.  I then selected a 3 mm x 20 mm Jade that was then inflated in the distal TP trunk proximal PT artery to nominal pressure for 2 minutes across the stenosis.  Another injection showed improvement but about 50% residual calcified stenosis.  I put the same Jade balloon back down and then did a second angioplasty to a higher pressure for 2 minutes with excellent results and no residual stenosis.  We had preserved runoff down the right lower extremity in the PT peroneal.  I then pulled the sheath back and looked at the right iliac stent in multiple oblique views and did not see any in-stent restenosis as suggested on duplex.  That point in time wires and catheters were removed and the sheath was pulled over the aortic bifurcation.  Taken holding to have the sheath removed.   Plan: Excellent results from right leg intervention today.  We will schedule for left leg intervention next week.  [name], MD [facility] Office: [phone]

## 2020-08-17 NOTE — H&P (Signed)
History and Physical Interval Note:  08/17/2020 10:03 AM  Thomas [name]  has presented today for surgery, with the diagnosis of pad.  The various methods of treatment have been discussed with the patient and family. After consideration of risks, benefits and other options for treatment, the patient has consented to  Procedure(s): ABDOMINAL AORTOGRAM W/LOWER EXTREMITY (N/A) as a surgical intervention.  The patient's history has been reviewed, patient examined, no change in status, stable for surgery.  I have reviewed the patient's chart and labs.  Questions were answered to the patient's satisfaction.    Aortogram, bilateral lower extremity arteriogram.  [name]  Patient name: Thomas [name]      MRN: [id number]        DOB: November 25, 1975          Sex: male  REASON FOR VISIT: Bilateral cold feet and new bilateral calf claudication  HPI: Thomas [name] is a 45 y.o. male with history of diabetes, hypertension, hyperlipidemia that presents for evaluation of new bilateral lower extremity calf claudication that started several weeks ago.  States he gets pain in both calves when he walks and this stops when he rests.  He has no new tissue loss.  He is smoking about 10 cigarettes a day.  Still taking his Plavix.  He is well-known to vascular surgery.  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  Ultimately underwent right external iliac angioplasty and stent on 04/19/2019.  Subsequently got a right above-knee popliteal artery to TP trunk bypass with reversed great saphenous vein on 04/22/2019.  All of his toes eventually healed.      Past Medical History:  Diagnosis Date  . Depression 01/27/2015  . Diabetes mellitus without complication (HCC)   . Hyperlipidemia   . Hypertension   . Peripheral vascular disease [facility])          Past Surgical History:  Procedure Laterality Date  . ABDOMINAL AORTOGRAM W/LOWER EXTREMITY Right  04/19/2019   Procedure: ABDOMINAL AORTOGRAM W/LOWER EXTREMITY;  Surgeon: [name], MD;  Location: [facility] INVASIVE CV LAB;  Service: Cardiovascular;  Laterality: Right;  . ABDOMINAL AORTOGRAM W/LOWER EXTREMITY Right 09/16/2019   Procedure: ABDOMINAL AORTOGRAM W/LOWER EXTREMITY;  Surgeon: [name], MD;  Location: [facility] INVASIVE CV LAB;  Service: Cardiovascular;  Laterality: Right;  . FEMORAL-TIBIAL BYPASS GRAFT Right 04/22/2019   Procedure: Right  FEMORAL-TIBIAL ARTERY BYPASS GRAFT with Harvest of Saphenous vein RIGHT SUPERFICIAL FEMORAL ARTERY TO TIBIOPERONEAL TRUNK BYPASS;  Surgeon: [name], MD;  Location: [facility] OR;  Service: Vascular;  Laterality: Right;  . IR CERVICAL/THORACIC DISC ASPIRATION W/IMAG GUIDE  04/15/2019  . IR FLUORO GUIDED NEEDLE PLC ASPIRATION/INJECTION LOC  04/15/2019  . kidney stones  2009  . PERIPHERAL VASCULAR BALLOON ANGIOPLASTY Right 09/16/2019   Procedure: PERIPHERAL VASCULAR BALLOON ANGIOPLASTY;  Surgeon: [name], MD;  Location: [facility] INVASIVE CV LAB;  Service: Cardiovascular;  Laterality: Right;  ext iliac DCB  . PERIPHERAL VASCULAR INTERVENTION  04/19/2019   Procedure: PERIPHERAL VASCULAR INTERVENTION;  Surgeon: [name], MD;  Location: [facility] INVASIVE CV LAB;  Service: Cardiovascular;;  right external iliac  . TEE WITHOUT CARDIOVERSION N/A 04/19/2019   Procedure: TRANSESOPHAGEAL ECHOCARDIOGRAM (TEE);  Surgeon: [name], MD;  Location: [facility] ENDOSCOPY;  Service: Endoscopy;  Laterality: N/A;    History reviewed. No pertinent family history.  SOCIAL HISTORY: Social History        Tobacco Use  . Smoking status: Current Every Day  Smoker    Packs/day: 1.00    Types: Cigarettes  . Smokeless tobacco: Former Neurosurgeon  . Tobacco comment: 5 cigarrettes a day  Substance Use Topics  . Alcohol use: No    Alcohol/week: 0.0 standard drinks    No Known Allergies        Current Outpatient Medications   Medication Sig Dispense Refill  . atorvastatin (LIPITOR) 10 MG tablet Take 1 tablet (10 mg total) by mouth daily at 6 PM. 30 tablet 2  . atorvastatin (LIPITOR) 10 MG tablet Take 1 tablet (10 mg total) by mouth daily. 30 tablet 11  . clopidogrel (PLAVIX) 75 MG tablet Take 1 tablet (75 mg total) by mouth daily with breakfast. 30 tablet 6  . insulin glargine (LANTUS SOLOSTAR) 100 UNIT/ML Solostar Pen 34 units at night 15 mL 2  . lisinopril (ZESTRIL) 20 MG tablet Take 1 tablet (20 mg total) by mouth daily. 90 tablet 3  . Omega-3 Fatty Acids (OMEGA-3 EPA FISH OIL PO) Take 2,000 Units by mouth.    Marland Kitchen omeprazole (PRILOSEC) 20 MG capsule Take 1 capsule (20 mg total) by mouth daily. 30 capsule 11  . Heating Pads PADS Use over the counter heating pads as needed for back pain (Patient not taking: Reported on 08/08/2020) 1 Units 5   No current facility-administered medications for this visit.    REVIEW OF SYSTEMS:  [X]  denotes positive finding, [ ]  denotes negative finding Cardiac  Comments:  Chest pain or chest pressure:    Shortness of breath upon exertion:    Short of breath when lying flat:    Irregular heart rhythm:        Vascular    Pain in calf, thigh, or hip brought on by ambulation:    Pain in feet at night that wakes you up from your sleep:     Blood clot in your veins:    Leg swelling:         Pulmonary    Oxygen at home:    Productive cough:     Wheezing:         Neurologic    Sudden weakness in arms or legs:     Sudden numbness in arms or legs:     Sudden onset of difficulty speaking or slurred speech:    Temporary loss of vision in one eye:     Problems with dizziness:         Gastrointestinal    Blood in stool:     Vomited blood:         Genitourinary    Burning when urinating:     Blood in urine:        Psychiatric    Major depression:         Hematologic    Bleeding problems:     Problems with blood clotting too easily:        Skin    Rashes or ulcers:        Constitutional    Fever or chills:      PHYSICAL EXAM:    Vitals:   08/08/20 0944  BP: (!) 149/83  Pulse: 99  Resp: 18  Temp: (!) 97.5 F (36.4 C)  TempSrc: Temporal  SpO2: 96%  Weight: 241 lb (109.3 kg)  Height: 5\' 9"  (1.753 m)    GENERAL: The patient is a well-nourished male, in no acute distress. The vital signs are documented above. CARDIAC: There is a regular rate and rhythm.  VASCULAR:  Palpable bilateral femoral pulse Brisk right PT signal  DATA:   ABIs today are 0.89 on the right biphasic and 0.62 on the left monophasic  Right external iliac stent has a greater than 50% stenosis with a velocity of 314 on iliac duplex today   Assessment/Plan:  45 y.o. male status post remote right leg revascularization presents with new bilateral lower extremity short distance lifestyle limiting calf claudication.  Previously underwent right external iliac angioplasty and stent on 04/19/2019 and then a right above-knee popliteal artery to TP trunk bypass with reversed great saphenous vein on 04/22/2019 for tissue loss - all of his wounds eventually healed.  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  Discussed would recommend aortogram with lower extremity arteriogram and possible intervention.  My initial focus will be on the right leg given he does appear to have a stenosis in his right external iliac stent that certainly could be explain his new calf claudication.  Discussed will also evaluate left leg runoff and make decisions about left leg intervention at a later time.  Risks and benefits discussed.   [name], MD [facility] [place] Office: [phone]

## 2020-08-17 NOTE — Progress Notes (Addendum)
Site area: Left groin a 6 french arterial long sheath was pulled by [employer] [facility]/ Army Melia RN  Site Prior to Removal:  Level 0  Pressure Applied For 45 MINUTES    Bedrest Beginning at 1440pm X 4hours  Manual:   Yes.    Patient Status During Pull:  stable  Post Pull Groin Site:  Level 0  Post Pull Instructions Given:  Yes.    Post Pull Pulses Present:  Yes.    Dressing Applied:  Yes.    Comments:

## 2020-08-17 NOTE — Discharge Instructions (Signed)
Angiogram, Care After This sheet gives you information about how to care for yourself after your procedure. Your health care provider may also give you more specific instructions. If you have problems or questions, contact your health care provider. What can I expect after the procedure? After the procedure, it is common to have:  Bruising and tenderness at the catheter insertion area.  A collection of blood (hematoma) at the insertion area. This may feel like a small lump under the skin at the insertion site. Follow these instructions at home: Insertion site care  Follow instructions from your health care provider about how to take care of your insertion site. Make sure you: ? Wash your hands with soap and water before and after you change your bandage (dressing). If soap and water are not available, use hand sanitizer. ? Change your dressing as told by your health care provider.  Do not take baths, swim, or use a hot tub until your health care provider approves.  You may shower 24-48 hours after the procedure, or as told by your health care provider. To clean the insertion site: ? Gently wash the area with plain soap and water. ? Pat the area dry with a clean towel. ? Do not rub the site. This may cause bleeding.  Check your insertion site every day for signs of infection. Check for: ? Redness, swelling, or pain. ? Fluid or blood. ? Warmth. ? Pus or a bad smell.  Do not apply powder or lotion to the site. Keep the site clean and dry.   Activity  Do not drive for 24 hours if you were given a sedative during your procedure.  Rest as told by your health care provider, usually for 1-2 days.  Do not lift anything that is heavier than 10 lb (4.5 kg), or the limit that you are told, until your health care provider says that it is safe.  If the insertion site was in your leg, try to avoid stairs for a few days.  Return to your normal activities as told by your health care provider,  usually in about a week. Ask your health care provider what activities are safe for you. General instructions  If your insertion site starts bleeding, lie flat and put pressure on the site. If the bleeding does not stop, get help right away. This is a medical emergency.  Take over-the-counter and prescription medicines only as told by your health care provider.  Drink enough fluid to keep your urine pale yellow. This helps flush the contrast dye from your body.  Keep all follow-up visits as told by your health care provider. This is important.   Contact a health care provider if:  You have a fever or chills.  You have redness, swelling, or pain around your insertion site.  You have fluid or blood coming from your insertion site.  Your insertion site feels warm to the touch.  You have pus or a bad smell coming from your insertion site.  You have more bruising around the insertion site. Get help right away if you have:  A problem with the insertion area, such as: ? The area swells fast or bleeds even after you apply pressure. ? The area becomes pale, cool, tingly, or numb.  Chest pain.  Trouble breathing.  A rash.  Any symptoms of a stroke. "BE FAST" is an easy way to remember the main warning signs of a stroke: ? B - Balance. Signs are dizziness, sudden trouble walking,   or loss of balance. ? E - Eyes. Signs are trouble seeing or a sudden change in vision. ? F - Face. Signs are sudden weakness or loss of feeling of the face, or the face or eyelid drooping on one side. ? A - Arms. Signs are weakness or loss of feeling in an arm. This happens suddenly and usually on one side of the body. ? S - Speech. Signs are sudden trouble speaking, slurred speech, or trouble understanding what people say. ? T - Time. Time to call emergency services. Write down what time symptoms started.  You have other signs of a stroke, such as: ? A sudden, severe headache with no known cause. ? Nausea  or vomiting. ? Seizure. These symptoms may represent a serious problem that is an emergency. Do not wait to see if the symptoms will go away. Get medical help right away. Call your local emergency services (911 in the [place] Do not drive yourself to the hospital. Summary  It is common to have bruising and tenderness at the catheter insertion area.  Do not take baths, swim, or use a hot tub until your health care provider approves. You may shower 24-48 hours after the procedure or as told.  It is important to rest and drink plenty of fluids.  If the insertion site bleeds, lie flat and put pressure on the site. If the bleeding continues, get help right away. This is a medical emergency. This information is not intended to replace advice given to you by your health care provider. Make sure you discuss any questions you have with your health care provider. Document Revised: 02/03/2019 Document Reviewed: 02/03/2019 Elsevier Patient Education  2021 Elsevier Inc.  

## 2020-08-18 ENCOUNTER — Encounter (HOSPITAL_COMMUNITY): Payer: Self-pay | Admitting: Vascular Surgery

## 2020-08-18 MED FILL — Clopidogrel Bisulfate Tab 75 MG (Base Equiv): ORAL | Qty: 1 | Status: AC

## 2020-08-21 ENCOUNTER — Other Ambulatory Visit: Payer: Self-pay

## 2020-08-23 ENCOUNTER — Other Ambulatory Visit (HOSPITAL_COMMUNITY)
Admission: RE | Admit: 2020-08-23 | Discharge: 2020-08-23 | Disposition: A | Discharge status: Home / Self Care | Payer: Medicaid Other | Source: Ambulatory Visit | Attending: Vascular Surgery | Admitting: Vascular Surgery

## 2020-08-23 DIAGNOSIS — Z01812 Encounter for preprocedural laboratory examination: Secondary | ICD-10-CM | POA: Insufficient documentation

## 2020-08-23 DIAGNOSIS — Z20822 Contact with and (suspected) exposure to covid-19: Secondary | ICD-10-CM | POA: Diagnosis not present

## 2020-08-23 LAB — SARS CORONAVIRUS 2 (TAT 6-24 HRS): SARS Coronavirus 2: NEGATIVE

## 2020-08-24 ENCOUNTER — Encounter (HOSPITAL_COMMUNITY)
Admission: RE | Disposition: A | Discharge status: Home / Self Care | Payer: Self-pay | Source: Home / Self Care | Attending: Vascular Surgery

## 2020-08-24 ENCOUNTER — Ambulatory Visit (HOSPITAL_COMMUNITY)
Admission: RE | Admit: 2020-08-24 | Discharge: 2020-08-24 | Disposition: A | Discharge status: Home / Self Care | Payer: Medicaid Other | Attending: Vascular Surgery | Admitting: Vascular Surgery

## 2020-08-24 ENCOUNTER — Other Ambulatory Visit: Payer: Self-pay

## 2020-08-24 DIAGNOSIS — F17210 Nicotine dependence, cigarettes, uncomplicated: Secondary | ICD-10-CM | POA: Insufficient documentation

## 2020-08-24 DIAGNOSIS — I70212 Atherosclerosis of native arteries of extremities with intermittent claudication, left leg: Secondary | ICD-10-CM

## 2020-08-24 DIAGNOSIS — Z794 Long term (current) use of insulin: Secondary | ICD-10-CM | POA: Diagnosis not present

## 2020-08-24 DIAGNOSIS — Z7902 Long term (current) use of antithrombotics/antiplatelets: Secondary | ICD-10-CM | POA: Diagnosis not present

## 2020-08-24 DIAGNOSIS — E1151 Type 2 diabetes mellitus with diabetic peripheral angiopathy without gangrene: Secondary | ICD-10-CM | POA: Diagnosis not present

## 2020-08-24 DIAGNOSIS — Z79899 Other long term (current) drug therapy: Secondary | ICD-10-CM | POA: Diagnosis not present

## 2020-08-24 DIAGNOSIS — I739 Peripheral vascular disease, unspecified: Secondary | ICD-10-CM

## 2020-08-24 HISTORY — PX: PERIPHERAL VASCULAR BALLOON ANGIOPLASTY: CATH118281

## 2020-08-24 LAB — POCT I-STAT, CHEM 8
BUN: 28 mg/dL — ABNORMAL HIGH (ref 6–20)
Calcium, Ion: 1.18 mmol/L (ref 1.15–1.40)
Chloride: 106 mmol/L (ref 98–111)
Creatinine, Ser: 1.1 mg/dL (ref 0.61–1.24)
Glucose, Bld: 161 mg/dL — ABNORMAL HIGH (ref 70–99)
HCT: 43 % (ref 39.0–52.0)
Hemoglobin: 14.6 g/dL (ref 13.0–17.0)
Potassium: 4.3 mmol/L (ref 3.5–5.1)
Sodium: 137 mmol/L (ref 135–145)
TCO2: 21 mmol/L — ABNORMAL LOW (ref 22–32)

## 2020-08-24 LAB — POCT ACTIVATED CLOTTING TIME: Activated Clotting Time: 172 seconds

## 2020-08-24 LAB — GLUCOSE, CAPILLARY: Glucose-Capillary: 82 mg/dL (ref 70–99)

## 2020-08-24 SURGERY — PERIPHERAL VASCULAR BALLOON ANGIOPLASTY
Anesthesia: LOCAL

## 2020-08-24 MED ORDER — HYDRALAZINE HCL 20 MG/ML IJ SOLN: 5.0000 mg | INTRAMUSCULAR | Status: DC | PRN

## 2020-08-24 MED ORDER — SODIUM CHLORIDE 0.9% FLUSH: 3.0000 mL | Freq: Two times a day (BID) | INTRAVENOUS | Status: DC

## 2020-08-24 MED ORDER — FENTANYL CITRATE (PF) 100 MCG/2ML IJ SOLN
INTRAMUSCULAR | Status: AC
  Filled 2020-08-24: qty 2

## 2020-08-24 MED ORDER — SODIUM CHLORIDE 0.9% FLUSH: 3.0000 mL | INTRAVENOUS | Status: DC | PRN

## 2020-08-24 MED ORDER — FENTANYL CITRATE (PF) 100 MCG/2ML IJ SOLN
INTRAMUSCULAR | Status: DC | PRN
  Administered 2020-08-24: 50 ug via INTRAVENOUS

## 2020-08-24 MED ORDER — MIDAZOLAM HCL 2 MG/2ML IJ SOLN
INTRAMUSCULAR | Status: AC
  Filled 2020-08-24: qty 2

## 2020-08-24 MED ORDER — IODIXANOL 320 MG/ML IV SOLN
INTRAVENOUS | Status: DC | PRN
  Administered 2020-08-24: 30 mL via INTRA_ARTERIAL

## 2020-08-24 MED ORDER — LIDOCAINE HCL (PF) 1 % IJ SOLN
INTRAMUSCULAR | Status: DC | PRN
  Administered 2020-08-24: 18 mL

## 2020-08-24 MED ORDER — CLOPIDOGREL BISULFATE 75 MG PO TABS: 75.0000 mg | ORAL_TABLET | Freq: Every day | ORAL | 11 refills | Status: DC

## 2020-08-24 MED ORDER — LIDOCAINE HCL (PF) 1 % IJ SOLN
INTRAMUSCULAR | Status: AC
  Filled 2020-08-24: qty 30

## 2020-08-24 MED ORDER — SODIUM CHLORIDE 0.9 % IV SOLN: INTRAVENOUS | Status: DC

## 2020-08-24 MED ORDER — HEPARIN SODIUM (PORCINE) 1000 UNIT/ML IJ SOLN
INTRAMUSCULAR | Status: AC
  Filled 2020-08-24: qty 1

## 2020-08-24 MED ORDER — HEPARIN (PORCINE) IN NACL 1000-0.9 UT/500ML-% IV SOLN
INTRAVENOUS | Status: AC
  Filled 2020-08-24: qty 500

## 2020-08-24 MED ORDER — HEPARIN SODIUM (PORCINE) 1000 UNIT/ML IJ SOLN
INTRAMUSCULAR | Status: DC | PRN
  Administered 2020-08-24: 10000 [IU] via INTRAVENOUS

## 2020-08-24 MED ORDER — MIDAZOLAM HCL 2 MG/2ML IJ SOLN
INTRAMUSCULAR | Status: DC | PRN
  Administered 2020-08-24: 1 mg via INTRAVENOUS

## 2020-08-24 MED ORDER — CLOPIDOGREL BISULFATE 75 MG PO TABS
ORAL_TABLET | ORAL | Status: DC | PRN
  Administered 2020-08-24: 75 mg via ORAL

## 2020-08-24 MED ORDER — ACETAMINOPHEN 325 MG PO TABS: 650.0000 mg | ORAL_TABLET | ORAL | Status: DC | PRN

## 2020-08-24 MED ORDER — ONDANSETRON HCL 4 MG/2ML IJ SOLN: 4.0000 mg | Freq: Four times a day (QID) | INTRAMUSCULAR | Status: DC | PRN

## 2020-08-24 MED ORDER — SODIUM CHLORIDE 0.9 % WEIGHT BASED INFUSION
1.0000 mL/kg/h | INTRAVENOUS | Status: DC
  Administered 2020-08-24: 1 mL/kg/h via INTRAVENOUS

## 2020-08-24 MED ORDER — LABETALOL HCL 5 MG/ML IV SOLN: 10.0000 mg | INTRAVENOUS | Status: DC | PRN

## 2020-08-24 MED ORDER — SODIUM CHLORIDE 0.9 % IV SOLN: 250.0000 mL | INTRAVENOUS | Status: DC | PRN

## 2020-08-24 MED ORDER — CLOPIDOGREL BISULFATE 75 MG PO TABS
ORAL_TABLET | ORAL | Status: AC
  Filled 2020-08-24: qty 1

## 2020-08-24 MED ORDER — HEPARIN (PORCINE) IN NACL 1000-0.9 UT/500ML-% IV SOLN
INTRAVENOUS | Status: DC | PRN
  Administered 2020-08-24 (×2): 500 mL

## 2020-08-24 SURGICAL SUPPLY — 19 items
BALLN STERLING OTW 3X60X150 (BALLOONS) ×3
BALLOON STERLING OTW 3X60X150 (BALLOONS) ×2 IMPLANT
CATH CXI SUPP 2.6F 150 ST (CATHETERS) ×3 IMPLANT
CATH OMNI FLUSH 5F 65CM (CATHETERS) ×3 IMPLANT
DCB RANGER 4.0X40 135 (BALLOONS) ×2 IMPLANT
GLIDEWIRE ANGLED NITR .018X260 (WIRE) ×3 IMPLANT
KIT ENCORE 26 ADVANTAGE (KITS) ×3 IMPLANT
KIT MICROPUNCTURE NIT STIFF (SHEATH) ×3 IMPLANT
KIT PV (KITS) ×3 IMPLANT
RANGER DCB 4.0X40 135 (BALLOONS) ×3
SHEATH FLEX ANSEL ANG 6F 45CM (SHEATH) ×3 IMPLANT
SHEATH PINNACLE 5F 10CM (SHEATH) ×3 IMPLANT
SHEATH PROBE COVER 6X72 (BAG) ×3 IMPLANT
SYR MEDRAD MARK V 150ML (SYRINGE) ×3 IMPLANT
TRANSDUCER W/STOPCOCK (MISCELLANEOUS) ×3 IMPLANT
TRAY PV CATH (CUSTOM PROCEDURE TRAY) ×3 IMPLANT
WIRE BENTSON .035X145CM (WIRE) ×3 IMPLANT
WIRE G V18X300CM (WIRE) ×12 IMPLANT
WIRE SPARTACORE .014X300CM (WIRE) ×3 IMPLANT

## 2020-08-24 NOTE — Op Note (Signed)
Patient name: Thomas [name] MRN: [id number] DOB: September 22, 1975 Sex: male  08/24/2020 Pre-operative Diagnosis: Left lower extremity short distance lifestyle limiting claudication with known tibial trifurcation occlusion Post-operative diagnosis:  Same Surgeon:  [name], MD Procedure Performed: 1.  Ultrasound-guided access of right common femoral artery 2.  Left lower extremity arteriogram with selection of third order branches 3.  Left below-knee popliteal artery, tibioperoneal trunk, proximal posterior tibial artery angioplasty (3 mm x 60 mm Sterling) 4.  Left SFA angioplasty (4 mm x 60 mm drug-coated Ranger) 5.  77 minutes of monitored moderate conscious sedation time  Indications: 45 year old male well-known to me with previous right iliac stent and a right lower extremity bypass for critical limb ischemia with tissue loss.  He recently presented with bilateral lower extremity short distance claudication.  He underwent intervention on the right leg last week and presents today for left leg intervention after risk benefits discussed.  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  Ultimately I went ahead and got right common femoral artery access and got a sheath over the aortic bifurcation into the left SFA.  I was finally able to cross the left TP trunk/proximal PT chronic total occlusion and get into the native PT artery distally in the calf.  This entire lesion was angioplastied with a 3 mm angioplasty balloon to nominal pressure for 2 minutes with excellent results and now inline flow down the left lower extremity through the TP trunk/PT artery.  The peroneal fills as well but this is heavily diseased.  I also angioplastied the left SFA focal high-grade stenosis greater than 80% in the mid SFA with a 4 mm drug-coated Ranger with excellent results.     Procedure:  The patient was  identified in the holding area and taken to room 8.  The patient was then placed supine on the table and prepped and draped in the usual sterile fashion.  A time out was called.  Ultrasound was used to evaluate the right common femoral artery.  It was patent .  A digital ultrasound image was acquired.  A micropuncture needle was used to access the right common femoral artery under ultrasound guidance.  An 018 wire was advanced without resistance and a micropuncture sheath was placed.  The 018 wire was removed and a benson wire was placed.  The micropuncture sheath was exchanged for a 5 french sheath.  An omniflush catheter was advanced over the wire to the level of L-1.  Aortogram was deferred.  I went and used Omni flush catheter with a Bentson wire to cross the aortic bifurcation and got a wire into the left SFA.  I subsequently placed a 6 [place] Ansell sheath in the right groin over the aortic bifurcation into the left SFA.  Patient was given 100 units/kg IV heparin.  I then used a CXI catheter with a V 18 to get down into the below-knee popliteal artery.  We did get some hand shots to get dedicated imaging of what appeared to be the TP trunk where it was occluded.  I then used a multitude of wires including a V 18, an 018 Glidewire as well as an 014 [place] core and I was finally able to cross the TP trunk occlusion and get into the PT true lumen distally.  We confirmed on hand-injection after advancing the catheter that we were in the true lumen of the  artery.  I went ahead and used a 3 mm Sterling and angioplasty the proximal PT/TP trunk and distal below-knee popliteal artery.  Hand-injection showed inline flow down this occluded segment with no dissection.  I then went up and got a dedicated image of the mid SFA where there was a known high-grade stenosis greater than 80%.  This was angioplastied with a 4 mm drug-coated Ranger to nominal pressure for 3 minutes with excellent results and less than 30% residual  stenosis.  Wires and catheters were removed.  Taken to holding have the sheath removed.  Plan: Patient will continue aspirin, Plavix, statin.  Will arrange follow-up in 1 month in our office with non-invasive imaging.   [name], MD [facility] [place] Office: [phone]

## 2020-08-24 NOTE — Progress Notes (Signed)
Site area: right groin  Site Prior to Removal:  Level 0  Pressure Applied For 24 MINUTES    Minutes Beginning at 1435  Manual:   Yes.    Patient Status During Pull:  Stable  Post Pull Groin Site:  Level 0  Post Pull Instructions Given:  Yes.    Post Pull Pulses Present:  Yes.    Dressing Applied:  Yes.    Comments:  Bed rest started at 1500 X 4 hr.

## 2020-08-24 NOTE — H&P (Signed)
History and Physical Interval Note:  08/24/2020 10:41 AM  Thomas [name]  has presented today for surgery, with the diagnosis of claudication.  The various methods of treatment have been discussed with the patient and family. After consideration of risks, benefits and other options for treatment, the patient has consented to  Procedure(s): ABDOMINAL AORTOGRAM W/LOWER EXTREMITY (N/A) as a surgical intervention.  The patient's history has been reviewed, patient examined, no change in status, stable for surgery.  I have reviewed the patient's chart and labs.  Questions were answered to the patient's satisfaction.    Left leg intervention.  SFA stenosis and tibial trifurcation occlusion.  Angiogram last week.    Thomas [name]  Patient name:Thomas MohammedMRN: [id number] DOB: 01/27/1977Sex: male  REASON FOR VISIT:Bilateral cold feetand new bilateralcalf claudication  HPI: Thomas [name] a 36 y.o.malewith history of diabetes, hypertension, hyperlipidemia that presents forevaluation ofnewbilateral lower extremity calf claudication that started several weeks ago.Stateshe gets pain in both calves when he walks and this stops when he rests. He has no new tissue loss. He is smoking about 10 cigarettes a day. Still taking his Plavix. He is well-known to vascular surgery.  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 Ultimately underwent right external iliac angioplasty and stent on 04/19/2019. Subsequently got a right above-knee popliteal arteryto TP trunk bypass with reversed great saphenous vein on 04/22/2019. All of his toes eventually healed.      Past Medical History:  Diagnosis Date  . Depression 01/27/2015  . Diabetes mellitus without complication (HCC)   . Hyperlipidemia   . Hypertension   . Peripheral vascular disease [facility])          Past Surgical History:  Procedure  Laterality Date  . ABDOMINAL AORTOGRAM W/LOWER EXTREMITY Right 04/19/2019   Procedure: ABDOMINAL AORTOGRAM W/LOWER EXTREMITY; Surgeon: Thomas Shelling, MD; Location: [facility] INVASIVE CV LAB; Service: Cardiovascular; Laterality: Right;  . ABDOMINAL AORTOGRAM W/LOWER EXTREMITY Right 09/16/2019   Procedure: ABDOMINAL AORTOGRAM W/LOWER EXTREMITY; Surgeon: Thomas Shelling, MD; Location: [facility] INVASIVE CV LAB; Service: Cardiovascular; Laterality: Right;  . FEMORAL-TIBIAL BYPASS GRAFT Right 04/22/2019   Procedure: Right FEMORAL-TIBIAL ARTERY BYPASS GRAFT with Harvest of Saphenous vein RIGHT SUPERFICIAL FEMORAL ARTERY TO TIBIOPERONEAL TRUNK BYPASS; Surgeon: Thomas Shelling, MD; [facility] OR; Service: Vascular; Laterality: Right;  . IR CERVICAL/THORACIC DISC ASPIRATION W/IMAG GUIDE  04/15/2019  . IR FLUORO GUIDED NEEDLE PLC ASPIRATION/INJECTION LOC  04/15/2019  . kidney stones  2009  . PERIPHERAL VASCULAR BALLOON ANGIOPLASTY Right 09/16/2019   Procedure: PERIPHERAL VASCULAR BALLOON ANGIOPLASTY; Surgeon: Thomas Shelling, MD; [facility] INVAS[facility] CV LAB; Service: Cardiovascular; Laterality: Right; ext iliac DCB  . PERIPHERAL VASCULAR INTERVENTION  04/19/2019   Procedure: PERIPHERAL VASCULAR INTERVENTION; Surgeon: Thomas Shelling, MD; Location: [facility] INVASIVE CV LAB; Service: Cardiovascular;; right external iliac  . TEE WITHOUT CARDIOVERSION N/A 04/19/2019   Procedure: TRANSESOPHAGEAL ECHOCARDIOGRAM (TEE); Surgeon: [name], MD; Location: [facility] ENDOSCOPY; Service: Endoscopy; Laterality: N/A;    History reviewed. No pertinent family history.  SOCIAL HISTORY: Social History        Tobacco Use  . Smoking status: Current Every Day Smoker    Packs/day: 1.00    Types: Cigarettes  . Smokeless tobacco: Former Neurosurgeon  . Tobacco comment: 5 cigarrettes a day  Substance Use Topics  . Alcohol use: No    Alcohol/week: 0.0 standard drinks     No Known Allergies        Current Outpatient Medications  Medication  Sig Dispense Refill  . atorvastatin (LIPITOR) 10 MG tablet Take 1 tablet (10 mg total) by mouth daily at 6 PM. 30 tablet 2  . atorvastatin (LIPITOR) 10 MG tablet Take 1 tablet (10 mg total) by mouth daily. 30 tablet 11  . clopidogrel (PLAVIX) 75 MG tablet Take 1 tablet (75 mg total) by mouth daily with breakfast. 30 tablet 6  . insulin glargine (LANTUS SOLOSTAR) 100 UNIT/ML Solostar Pen 34 units at night 15 mL 2  . lisinopril (ZESTRIL) 20 MG tablet Take 1 tablet (20 mg total) by mouth daily. 90 tablet 3  . Omega-3 Fatty Acids (OMEGA-3 EPA FISH OIL PO) Take 2,000 Units by mouth.    Marland Kitchen omeprazole (PRILOSEC) 20 MG capsule Take 1 capsule (20 mg total) by mouth daily. 30 capsule 11  . Heating Pads PADS Use over the counter heating pads as needed for back pain (Patient not taking: Reported on 08/08/2020) 1 Units 5   No current facility-administered medications for this visit.    REVIEW OF SYSTEMS: [X]  denotes positive finding, [ ]  denotes negative finding Cardiac  Comments:  Chest pain or chest pressure:    Shortness of breath upon exertion:    Short of breath when lying flat:    Irregular heart rhythm:        Vascular    Pain in calf, thigh, or hip brought on by ambulation:    Pain in feet at night that wakes you up from your sleep:     Blood clot in your veins:    Leg swelling:         Pulmonary    Oxygen at home:    Productive cough:     Wheezing:         Neurologic    Sudden weakness in arms or legs:     Sudden numbness in arms or legs:     Sudden onset of difficulty speaking or slurred speech:    Temporary loss of vision in one eye:     Problems with dizziness:         Gastrointestinal    Blood in stool:     Vomited blood:         Genitourinary    Burning when urinating:     Blood in urine:        Psychiatric     Major depression:         Hematologic    Bleeding problems:    Problems with blood clotting too easily:        Skin    Rashes or ulcers:        Constitutional    Fever or chills:      PHYSICAL EXAM:    Vitals:   08/08/20 0944  BP: (!) 149/83  Pulse: 99  Resp: 18  Temp: (!) 97.5 F (36.4 C)  TempSrc: Temporal  SpO2: 96%  Weight: 241 lb (109.3 kg)  Height: 5\' 9"  (1.753 m)    GENERAL:The patient is a well-nourished male, in no acute distress. The vital signs are documented above. CARDIAC:There is a regular rate and rhythm.  VASCULAR: Palpablebilateralfemoral pulse Brisk right PT signal  DATA:  ABIs today are 0.89 on the right biphasicand0.62 on the left monophasic  Right external iliac stent has a greater than 50% stenosis with a velocity of 314on iliac duplex today   Assessment/Plan:  44y.o. male status post remoteright leg revascularizationpresents with new bilateral lower extremity short distance lifestyle limitingcalf claudication.Previouslyunderwent right external iliac  angioplasty and stent on 04/19/2019 and then aright above-knee popliteal arteryto TP trunk bypass with reversed great saphenous vein on 04/22/2019 fortissue loss- all of his wounds eventually healed.  Duplex today would suggest he does have a recurrent stenosis in the right iliac stent.Discussed would recommend aortogram with lower extremity arteriogramand possible intervention. My initial focus will be on the right leg given he does appear to have a stenosis in his right external iliac stent that certainly could be explain his new calf claudication. Discussed will also evaluate left leg runoff and make decisions about left leg intervention at a later time.Risks and benefits discussed.   Thomas Shelling, MD [facility] Office: [phone]

## 2020-08-24 NOTE — Progress Notes (Signed)
Discharge instructions reviewed with pt and his friend Majeed (via telephone) both voice understanding.

## 2020-08-24 NOTE — Discharge Instructions (Signed)
Femoral Site Care  This sheet gives you information about how to care for yourself after your procedure. Your health care provider may also give you more specific instructions. If you have problems or questions, contact your health care provider. What can I expect after the procedure? After the procedure, it is common to have:  Bruising that usually fades within 1-2 weeks.  Tenderness at the site. Follow these instructions at home: Wound care  Follow instructions from your health care provider about how to take care of your insertion site. Make sure you: ? Wash your hands with soap and water before you change your bandage (dressing). If soap and water are not available, use hand sanitizer. ? Change your dressing as told by your health care provider. ? Leave stitches (sutures), skin glue, or adhesive strips in place. These skin closures may need to stay in place for 2 weeks or longer. If adhesive strip edges start to loosen and curl up, you may trim the loose edges. Do not remove adhesive strips completely unless your health care provider tells you to do that.  Do not take baths, swim, or use a hot tub until your health care provider approves.  You may shower 24-48 hours after the procedure or as told by your health care provider. ? Gently wash the site with plain soap and water. ? Pat the area dry with a clean towel. ? Do not rub the site. This may cause bleeding.  Do not apply powder or lotion to the site. Keep the site clean and dry.  Check your femoral site every day for signs of infection. Check for: ? Redness, swelling, or pain. ? Fluid or blood. ? Warmth. ? Pus or a bad smell. Activity  For the first 2-3 days after your procedure, or as long as directed: ? Avoid climbing stairs as much as possible. ? Do not squat.  Do not lift anything that is heavier than 10 lb (4.5 kg), or the limit that you are told, until your health care provider says that it is safe.  Rest as  directed. ? Avoid sitting for a long time without moving. Get up to take short walks every 1-2 hours.  Do not drive for 24 hours if you were given a medicine to help you relax (sedative). General instructions  Take over-the-counter and prescription medicines only as told by your health care provider.  Keep all follow-up visits as told by your health care provider. This is important. Contact a health care provider if you have:  A fever or chills.  You have redness, swelling, or pain around your insertion site. Get help right away if:  The catheter insertion area swells very fast.  You pass out.  You suddenly start to sweat or your skin gets clammy.  The catheter insertion area is bleeding, and the bleeding does not stop when you hold steady pressure on the area.  The area near or just beyond the catheter insertion site becomes pale, cool, tingly, or numb. These symptoms may represent a serious problem that is an emergency. Do not wait to see if the symptoms will go away. Get medical help right away. Call your local emergency services (911 in the [place] Do not drive yourself to the hospital. Summary  After the procedure, it is common to have bruising that usually fades within 1-2 weeks.  Check your femoral site every day for signs of infection.  Do not lift anything that is heavier than 10 lb (4.5 kg), or   the limit that you are told, until your health care provider says that it is safe. This information is not intended to replace advice given to you by your health care provider. Make sure you discuss any questions you have with your health care provider. Document Revised: 12/03/2019 Document Reviewed: 12/03/2019 Elsevier Patient Education  2021 Elsevier Inc.  

## 2020-08-25 ENCOUNTER — Encounter (HOSPITAL_COMMUNITY): Payer: Self-pay | Admitting: Vascular Surgery

## 2020-08-25 LAB — POCT ACTIVATED CLOTTING TIME: Activated Clotting Time: 214 seconds

## 2020-08-29 ENCOUNTER — Telehealth: Payer: Self-pay | Admitting: Medical

## 2020-08-29 MED ORDER — LANTUS SOLOSTAR 100 UNIT/ML ~~LOC~~ SOPN: 20.0000 [IU] | PEN_INJECTOR | Freq: Every day | SUBCUTANEOUS | 0 refills | Status: DC

## 2020-08-29 NOTE — Telephone Encounter (Signed)
Rx faxed

## 2020-08-29 NOTE — Addendum Note (Signed)
Addended by: Maximino Sarin on: 08/29/2020 01:59 PM   Modules accepted: Orders

## 2020-08-29 NOTE — Telephone Encounter (Signed)
Medication: insulin glargine (LANTUS SOLOSTAR) 100 UNIT/ML Solostar Pen [[id number]     Has the patient contacted their pharmacy? no (If no, request that the patient contact the pharmacy for the refill.) (If yes, when and what did the pharmacy advise?)    Preferred Pharmacy (with phone number or street name):   [facility] - [place], [place] - [address] Phone:  [phone]  Fax:  [phone]         Agent: Please be advised that RX refills may take up to 3 business days. We ask that you follow-up with your pharmacy.

## 2020-09-15 ENCOUNTER — Other Ambulatory Visit: Payer: Self-pay

## 2020-09-15 ENCOUNTER — Encounter: Payer: Self-pay | Admitting: Internal Medicine

## 2020-09-15 ENCOUNTER — Ambulatory Visit (INDEPENDENT_AMBULATORY_CARE_PROVIDER_SITE_OTHER): Payer: Medicaid Other | Admitting: Internal Medicine

## 2020-09-15 VITALS — BP 144/86 | HR 98 | Ht 69.0 in | Wt 243.5 lb

## 2020-09-15 DIAGNOSIS — E1142 Type 2 diabetes mellitus with diabetic polyneuropathy: Secondary | ICD-10-CM | POA: Diagnosis not present

## 2020-09-15 DIAGNOSIS — E785 Hyperlipidemia, unspecified: Secondary | ICD-10-CM | POA: Diagnosis not present

## 2020-09-15 DIAGNOSIS — Z794 Long term (current) use of insulin: Secondary | ICD-10-CM

## 2020-09-15 DIAGNOSIS — E11649 Type 2 diabetes mellitus with hypoglycemia without coma: Secondary | ICD-10-CM

## 2020-09-15 DIAGNOSIS — E1159 Type 2 diabetes mellitus with other circulatory complications: Secondary | ICD-10-CM | POA: Diagnosis not present

## 2020-09-15 DIAGNOSIS — E1165 Type 2 diabetes mellitus with hyperglycemia: Secondary | ICD-10-CM | POA: Diagnosis not present

## 2020-09-15 LAB — POCT GLUCOSE (DEVICE FOR HOME USE): POC Glucose: 247 mg/dl — AB (ref 70–99)

## 2020-09-15 MED ORDER — INSULIN PEN NEEDLE 32G X 4 MM MISC: 1.0000 | Freq: Three times a day (TID) | 3 refills | Status: DC

## 2020-09-15 MED ORDER — INSULIN LISPRO (1 UNIT DIAL) 100 UNIT/ML (KWIKPEN): 5.0000 [IU] | PEN_INJECTOR | Freq: Two times a day (BID) | SUBCUTANEOUS | 11 refills | Status: DC

## 2020-09-15 NOTE — Patient Instructions (Addendum)
-   Decrease  Lantus to 30 units daily  - Start HUmalog 5  units with each meal      HOW TO TREAT LOW BLOOD SUGARS (Blood sugar LESS THAN 70 MG/DL)  Please follow the RULE OF 15 for the treatment of hypoglycemia treatment (when your (blood sugars are less than 70 mg/dL)    STEP 1: Take 15 grams of carbohydrates when your blood sugar is low, which includes:   3-4 GLUCOSE TABS  OR  3-4 OZ OF JUICE OR REGULAR SODA OR  ONE TUBE OF GLUCOSE GEL     STEP 2: RECHECK blood sugar in 15 MINUTES STEP 3: If your blood sugar is still low at the 15 minute recheck --> then, go back to STEP 1 and treat AGAIN with another 15 grams of carbohydrates.

## 2020-09-15 NOTE — Progress Notes (Signed)
Name: Rontae Inglett  MRN/ DOB: [id number], 12/18/1975   Age/ Sex: 45 y.o., male    PCP: Marisue Brooklyn   Reason for Endocrinology Evaluation: Type 2 Diabetes Mellitus     Date of Initial Endocrinology Visit: 09/15/2020     PATIENT IDENTIFIER: Mr. Remus Hagedorn is a 45 y.o. male with a past medical history of T2DM, Dyslipidemia and HTN . The patient presented for initial endocrinology clinic visit on 09/15/2020 for consultative assistance with his diabetes management.    HPI: Mr. Alcorta was    Diagnosed with DM 2008 Prior Medications tried/Intolerance: metfrmin , insulin 3 yrs  Currently checking blood sugars 2x / day,  before meals  Hypoglycemia episodes : yes           Hemoglobin A1c has ranged from 6.7% in 2021, peaking at 15.8% in 2020. Patient required assistance for hypoglycemia: no  Patient has required hospitalization within the last 1 year from hyper or hypoglycemia: no   In terms of diet, the patient two times a day, avoids sugar-sweetened beverages   Has GERD with heartburn and vomiting      HOME DIABETES REGIMEN: Lantus 34 daily    Statin: yes ACE-I/ARB: yes Prior Diabetic Education: no   METER DOWNLOAD SUMMARY: Did not bring    DIABETIC COMPLICATIONS: Microvascular complications:     Denies: CKD, neuropathy , retinopathy   Last eye exam: Completed 03/2020  Macrovascular complications:   PVD ( S/P right external iliac angioplasty and stent (04/19/2019) followed by bypass 08/2020  Denies: CAD,  CVA   PAST HISTORY: Past Medical History:  Past Medical History:  Diagnosis Date  . Depression 01/27/2015  . Diabetes mellitus without complication (HCC)   . Hyperlipidemia   . Hypertension   . Peripheral vascular disease [facility])    Past Surgical History:  Past Surgical History:  Procedure Laterality Date  . ABDOMINAL AORTOGRAM W/LOWER EXTREMITY Right 04/19/2019   Procedure: ABDOMINAL AORTOGRAM W/LOWER EXTREMITY;  Surgeon: [name], MD;  Location: [facility] INVASIVE CV LAB;  Service: Cardiovascular;  Laterality: Right;  . ABDOMINAL AORTOGRAM W/LOWER EXTREMITY Right 09/16/2019   Procedure: ABDOMINAL AORTOGRAM W/LOWER EXTREMITY;  Surgeon: [name], MD;  Location: [facility] INVASIVE CV LAB;  Service: Cardiovascular;  Laterality: Right;  . ABDOMINAL AORTOGRAM W/LOWER EXTREMITY N/A 08/17/2020   Procedure: ABDOMINAL AORTOGRAM W/LOWER EXTREMITY;  Surgeon: [name], MD;  Location: [facility] INVASIVE CV LAB;  Service: Cardiovascular;  Laterality: N/A;  . FEMORAL-TIBIAL BYPASS GRAFT Right 04/22/2019   Procedure: Right  FEMORAL-TIBIAL ARTERY BYPASS GRAFT with Harvest of Saphenous vein RIGHT SUPERFICIAL FEMORAL ARTERY TO TIBIOPERONEAL TRUNK BYPASS;  Surgeon: [name], MD;  Location: [facility] OR;  Service: Vascular;  Laterality: Right;  . IR CERVICAL/THORACIC DISC ASPIRATION W/IMAG GUIDE  04/15/2019  . IR FLUORO GUIDED NEEDLE PLC ASPIRATION/INJECTION LOC  04/15/2019  . kidney stones  2009  . PERIPHERAL VASCULAR BALLOON ANGIOPLASTY Right 09/16/2019   Procedure: PERIPHERAL VASCULAR BALLOON ANGIOPLASTY;  Surgeon: [name], MD;  Location: [facility] INVASIVE CV LAB;  Service: Cardiovascular;  Laterality: Right;  ext iliac DCB  . PERIPHERAL VASCULAR BALLOON ANGIOPLASTY  08/17/2020   Procedure: PERIPHERAL VASCULAR BALLOON ANGIOPLASTY;  Surgeon: [name], MD;  [facility]: [facility] INVASIVE CV LAB;  Service: Cardiovascular;;  Right TP trunk  . PERIPHERAL VASCULAR BALLOON ANGIOPLASTY Left 08/24/2020   Procedure: PERIPHERAL VASCULAR BALLOON ANGIOPLASTY;  Surgeon: [name], MD;  Location: [facility] INVASIVE CV LAB;  Service: Cardiovascular;  Laterality: Left;  Tibioperoneal trunk and  superficial femoral arteries.  Marland Kitchen PERIPHERAL VASCULAR INTERVENTION  04/19/2019   Procedure: PERIPHERAL VASCULAR INTERVENTION;  Surgeon: [name], MD;  Location: [facility] INVASIVE CV LAB;  Service: Cardiovascular;;  right external iliac  . TEE WITHOUT  CARDIOVERSION N/A 04/19/2019   Procedure: TRANSESOPHAGEAL ECHOCARDIOGRAM (TEE);  Surgeon: [name], MD;  Location: [facility] ENDOSCOPY;  Service: Endoscopy;  Laterality: N/A;      Social History:  reports that he has been smoking cigarettes. He has been smoking about 1.00 pack per day. He has quit using smokeless tobacco. He reports that he does not drink alcohol and does not use drugs.  Family History: No family history on file.   HOME MEDICATIONS: Allergies as of 09/15/2020   No Known Allergies     Medication List       Accurate as of September 15, 2020  2:16 PM. If you have any questions, ask your nurse or doctor.        aspirin EC 81 MG tablet Take 1 tablet (81 mg total) by mouth daily. Swallow whole.   atorvastatin 10 MG tablet Commonly known as: LIPITOR Take 1 tablet (10 mg total) by mouth daily.   clopidogrel 75 MG tablet Commonly known as: PLAVIX Take 1 tablet (75 mg total) by mouth daily with breakfast.   clopidogrel 75 MG tablet Commonly known as: Plavix Take 1 tablet (75 mg total) by mouth daily.   Heating Pads Pads Use over the counter heating pads as needed for back pain   Lantus SoloStar 100 UNIT/ML Solostar Pen Generic drug: insulin glargine Inject 20-34 Units into the skin daily.   lisinopril 20 MG tablet Commonly known as: ZESTRIL Take 1 tablet (20 mg total) by mouth daily.   OMEGA-3 EPA FISH OIL PO Take 2,000 Units by mouth daily.   omeprazole 20 MG capsule Commonly known as: PRILOSEC Take 1 capsule (20 mg total) by mouth daily.        ALLERGIES: No Known Allergies   REVIEW OF SYSTEMS: A comprehensive ROS was conducted with the patient and is negative except as per HPI and below:  Review of Systems  Gastrointestinal: Negative for diarrhea and nausea.  Neurological: Positive for tingling.      OBJECTIVE:   VITAL SIGNS: BP (!) 152/92   Pulse 98   Ht 5\' 9"  (1.753 m)   Wt 243 lb 8 oz (110.5 kg)   SpO2 98%   BMI 35.96 kg/m     PHYSICAL EXAM:  General: Pt appears well and is in NAD  Hydration: Well-hydrated with moist mucous membranes and good skin turgor  HEENT: Head: Unremarkable with good dentition. Oropharynx clear without exudate.  Eyes: External eye exam normal without stare, lid lag or exophthalmos.  EOM intact.  PERRL.  Neck: General: Supple without adenopathy or carotid bruits. Thyroid: Thyroid size normal.  No goiter or nodules appreciated. No thyroid bruit.  Lungs: Clear with good BS bilat with no rales, rhonchi, or wheezes  Heart: RRR with normal S1 and S2 and no gallops; no murmurs; no rub  Abdomen: Normoactive bowel sounds, soft, nontender, without masses or organomegaly palpable  Extremities:  Lower extremities - No pretibial edema. No lesions.  Skin: Normal texture and temperature to palpation. No rash noted. No Acanthosis nigricans/skin tags. No lipohypertrophy.  Neuro: MS is good with appropriate affect, pt is alert and Ox3      DATA REVIEWED:  Lab Results  Component Value Date   HGBA1C 11.3 (H) 08/03/2020   HGBA1C 10.1  Repeated and verified X2. (H) 05/05/2020   HGBA1C 8.3 (H) 01/28/2020   Lab Results  Component Value Date   MICROALBUR 27.0 11/26/2018   LDLCALC 108 (H) 08/03/2020   CREATININE 1.10 08/24/2020   No results found for: [facility]  Lab Results  Component Value Date   CHOL 179 08/03/2020   HDL 42.60 08/03/2020   LDLCALC 108 (H) 08/03/2020   LDLDIRECT 86.0 05/05/2020   TRIG 142.0 08/03/2020   CHOLHDL 4 08/03/2020        ASSESSMENT / PLAN / RECOMMENDATIONS:   1) Type 2 Diabetes Mellitus, Poorly controlled, With Neuropathic macrovascular complications - Most recent A1c of 11.3 %. Goal A1c < 7.0 %.    Plan: GENERAL: I have discussed with the patient the pathophysiology of diabetes. We went over the natural progression of the disease. We talked about both insulin resistance and insulin deficiency. We stressed the importance of lifestyle changes including diet  and exercise. I explained the complications associated with diabetes including retinopathy, nephropathy, neuropathy as well as increased risk of cardiovascular disease. We went over the benefit seen with glycemic control.    I explained to the patient that diabetic patients are at higher than normal risk for amputations.  Discussed pharmacokinetics of basal/bolus insulin and the importance of taking prandial insulin with meals.   We also discussed avoiding sugar-sweetened beverages and snacks, when possible.   Pt with fear of hypoglycemia and continued to question the fact that I am asking him to take two different insulin , this was explained to the pt at least 3x, we have agreed on decreasing basal insulin to 20 units daily and if he notices hyperglycemia to increased every 4 days to the goal of 30 units if needed   I have also advised him not to vary basal insulin dossing, will consider oral glycemic agents ( Metformin/ GLP-1 agonists in the future or SGLT-2 inhibitors ) if no contra-indication   At this time he has severe GERD causing vomiting, so will avoid GLp-1 agonists and Metformin due to risk of GI side effects   MEDICATIONS:  Start Humalog 5 units with each meal   Decrease Lantus to 30 units daily   EDUCATION / INSTRUCTIONS:  BG monitoring instructions: Patient is instructed to check his blood sugars 3 times a day, before each meal   Call [facility] if: BG persistently < 70  . I reviewed the Rule of 15 for the treatment of hypoglycemia in detail with the patient. Literature supplied.   2) Diabetic complications:   Eye: Does not have known diabetic retinopathy.   Neuro/ Feet: Does  have known diabetic peripheral neuropathy. Due to c/o tingling of feet  Renal: Patient does not have known baseline CKD. He is  on an ACEI/ARB at present.   3) Dyslipidemia: Patient with elevated Tg and LDL . He is on Atorvastatin 10 mg daily , he is not sure how long he has  been on it.  Discussed cardiovascular benefits of statins    I spent 45 minutes preparing to see the patient by review of recent labs, imaging and procedures, obtaining and reviewing separately obtained history, communicating with the patient/family or caregiver, ordering medications, tests or procedures, and documenting clinical information in the EHR including the differential Dx, treatment, and any further evaluation and other management   F/U in 3 months   Signed electronically by: Lyndle Herrlich, MD  [facility]  [facility] [address] [place] [place] [address]  [place], [place] [address] Phone: [phone] FAX: [phone]   CC: Marisue BrooklynSaguier, Edward, PA-C [id number] [place] [address] Phone: [phone]  Fax: [phone]    Return to Endocrinology clinic as below: Future Appointments  Date Time Provider Department Center  09/19/2020 11:00 AM Shellia CleverlyCirigliano, Vito V, DO [facility]  09/26/2020 10:00 AM [facility] - [facility]  09/26/2020 11:00 AM [facility] - [facility]  09/26/2020 11:45 AM [facility] PA [facility]  11/06/2020 11:00 AM Saguier, Kateri McEdward, PA-C [facility]

## 2020-09-18 ENCOUNTER — Telehealth: Payer: Self-pay | Admitting: Internal Medicine

## 2020-09-18 DIAGNOSIS — E785 Hyperlipidemia, unspecified: Secondary | ICD-10-CM | POA: Insufficient documentation

## 2020-09-18 DIAGNOSIS — Z794 Long term (current) use of insulin: Secondary | ICD-10-CM | POA: Insufficient documentation

## 2020-09-18 DIAGNOSIS — E119 Type 2 diabetes mellitus without complications: Secondary | ICD-10-CM | POA: Insufficient documentation

## 2020-09-18 DIAGNOSIS — E1142 Type 2 diabetes mellitus with diabetic polyneuropathy: Secondary | ICD-10-CM | POA: Insufficient documentation

## 2020-09-18 DIAGNOSIS — E1165 Type 2 diabetes mellitus with hyperglycemia: Secondary | ICD-10-CM | POA: Insufficient documentation

## 2020-09-18 MED ORDER — DEXCOM G6 TRANSMITTER MISC: 1.0000 | 3 refills | Status: DC

## 2020-09-18 MED ORDER — DEXCOM G6 SENSOR MISC: 1.0000 | 3 refills | Status: DC

## 2020-09-18 NOTE — Telephone Encounter (Signed)
Thomas [name], Dr. [name] advised him to decrease the dose of Lantus to 20 units when starting Humalog.  It looks like he needs to increase it back to 30 units and if sugars remain elevated, he will need to also increase Humalog to 7-8 units before every meal.  He can continue to increase these if sugars remain high after meals.

## 2020-09-18 NOTE — Telephone Encounter (Signed)
Patient called via [employer] - he started the 5 units Humalog twice daily with meals as per his visit with Dr [name] on 09/15/20. Since then his sugars have gone up - he is averaging 270-300.

## 2020-09-18 NOTE — Telephone Encounter (Signed)
Please advise 

## 2020-09-19 ENCOUNTER — Telehealth: Payer: Self-pay | Admitting: Pharmacy Technician

## 2020-09-19 ENCOUNTER — Ambulatory Visit (INDEPENDENT_AMBULATORY_CARE_PROVIDER_SITE_OTHER): Payer: Medicaid Other | Admitting: Gastroenterology

## 2020-09-19 ENCOUNTER — Other Ambulatory Visit: Payer: Self-pay

## 2020-09-19 ENCOUNTER — Encounter: Payer: Self-pay | Admitting: Gastroenterology

## 2020-09-19 VITALS — BP 122/82 | HR 103 | Ht 69.0 in | Wt 244.0 lb

## 2020-09-19 DIAGNOSIS — R140 Abdominal distension (gaseous): Secondary | ICD-10-CM

## 2020-09-19 DIAGNOSIS — R9389 Abnormal findings on diagnostic imaging of other specified body structures: Secondary | ICD-10-CM

## 2020-09-19 DIAGNOSIS — E6609 Other obesity due to excess calories: Secondary | ICD-10-CM | POA: Diagnosis not present

## 2020-09-19 DIAGNOSIS — Z6836 Body mass index (BMI) 36.0-36.9, adult: Secondary | ICD-10-CM | POA: Diagnosis not present

## 2020-09-19 DIAGNOSIS — K219 Gastro-esophageal reflux disease without esophagitis: Secondary | ICD-10-CM

## 2020-09-19 NOTE — Patient Instructions (Signed)
If you are age 44 or older, your body mass index should be between 23-30. Your Body mass index is 36.03 kg/m. If this is out of the aforementioned range listed, please consider follow up with your Primary Care Provider.  If you are age 12 or younger, your body mass index should be between 19-25. Your Body mass index is 36.03 kg/m. If this is out of the aformentioned range listed, please consider follow up with your Primary Care Provider.   __________________________________________________________  The [facility] providers would like to encourage you to use [facility] to communicate with providers for non-urgent requests or questions.  Due to long hold times on the telephone, sending your provider a message by [facility] may be a faster and more efficient way to get a response.  Please allow 48 business hours for a response.  Please remember that this is for non-urgent requests.   Patient is having Dental work on 10/17/2020, will call after healing to schedule Endoscopy and Colonoscopy. Did not want to schedule at this time.  Due to recent changes in healthcare laws, you may see the results of your imaging and laboratory studies on MyChart before your provider has had a chance to review them.  We understand that in some cases there may be results that are confusing or concerning to you. Not all laboratory results come back in the same time frame and the provider may be waiting for multiple results in order to interpret others.  Please give [place] 48 hours in order for your provider to thoroughly review all the results before contacting the office for clarification of your results.   Thank you for choosing me and [facility].  Vito Cirigliano, D.O.

## 2020-09-19 NOTE — Telephone Encounter (Signed)
[facility] Patient Advocate Encounter  Prior Authorization for [facility] G6 TRANSMITTER  has been approved.    PA# [id number] Effective dates: 09/19/2020 through 03/18/2021  When PA was initiated, the response came back that the SENSOR is available without authorization.   [facility] will continue to follow.   Netty Starring. Dimas Aguas, CPhT Patient Advocate [facility] Phone: [phone] Fax:  [phone]

## 2020-09-19 NOTE — Progress Notes (Signed)
Chief Complaint: GERD, bloating   Referring Provider:     [name], PA-C   HPI:     Thomas [name] is a 45 y.o. male with a history of HTN, hyperlipidemia, diabetes, PVD (on ASA/Plavix; s/p right external iliac angioplasty and stent 04/2019 followed by bypass in 08/2020), depression, tobacco use disorder, referred to the Gastroenterology Clinic for evaluation of GERD, abdominal bloating.  History obtained via interpretor today.  Describes post prandial abdominal bloating and gas distention.  Independent of food type.  Has been present 4-5 months. Sxs last all day.  No associated change in bowel habits.  Additionally, reports heartburn and regurgitation.  GERD symptoms improve with omeprazole. No dysphagia.  He does report 40#weight gain over the last year.  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 Last ID f/u was 07/2020.   Scheduled for continued dental surgery on 10/17/20 for continued serial tooth extractions of all of his teeth due to poor dentition.   CT abdomen/pelvis in 03/2019 n/f focal circumferential wall thickening of the rectum.  No subsequent flexible sigmoidoscopy or colonoscopy.  No known family history of CRC, GI malignancy, liver disease, pancreatic disease, or IBD.     Past Medical History:  Diagnosis Date  . Depression 01/27/2015  . Diabetes mellitus without complication (HCC)   . Hyperlipidemia   . Hypertension   . Peripheral vascular disease [facility])      Past Surgical History:  Procedure Laterality Date  . ABDOMINAL AORTOGRAM W/LOWER EXTREMITY Right 04/19/2019   Procedure: ABDOMINAL AORTOGRAM W/LOWER EXTREMITY;  Surgeon: [name], MD;  Location[facility] INVASIVE CV LAB;  Service: Cardiovascular;  Laterality: Right;  . ABDOMINAL AORTOGRAM W/LOWER EXTREMITY Right 09/16/2019   Procedure: ABDOMINAL AORTOGRAM W/LOWER EXTREMITY;  Surgeon: [name], MD;  Location[facility] INVASIVE CV LAB;  Service: Cardiovascular;  Laterality: Right;  . ABDOMINAL AORTOGRAM W/LOWER EXTREMITY N/A 08/17/2020   Procedure: ABDOMINAL AORTOGRAM W/LOWER EXTREMITY;  Surgeon: [name], MD;  [facility]: [facility] INVASIVE CV LAB;  Service: Cardiovascular;  Laterality: N/A;  . FEMORAL-TIBIAL BYPASS GRAFT Right 04/22/2019   Procedure: Right  FEMORAL-TIBIAL ARTERY BYPASS GRAFT with Harvest of Saphenous vein RIGHT SUPERFICIAL FEMORAL ARTERY TO TIBIOPERONEAL TRUNK BYPASS;  Surgeon: [name], MD;  Location: [facility] OR;  Service: Vascular;  Laterality: Right;  . IR CERVICAL/THORACIC DISC ASPIRATION W/IMAG GUIDE  04/15/2019  . IR FLUORO GUIDED NEEDLE PLC ASPIRATION/INJECTION LOC  04/15/2019  . kidney stones  2009  . PERIPHERAL VASCULAR BALLOON ANGIOPLASTY Right 09/16/2019   Procedure: PERIPHERAL VASCULAR BALLOON ANGIOPLASTY;  Surgeon: [name], MD;  Location: [facility] INVASIVE CV LAB;  Service: Cardiovascular;  Laterality: Right;  ext iliac DCB  . PERIPHERAL VASCULAR BALLOON ANGIOPLASTY  08/17/2020   Procedure: PERIPHERAL VASCULAR BALLOON ANGIOPLASTY;  Surgeon: [name], MD;  Location: [facility] INVASIVE CV LAB;  Service: Cardiovascular;;  Right TP trunk  . PERIPHERAL VASCULAR BALLOON ANGIOPLASTY Left 08/24/2020   Procedure: PERIPHERAL VASCULAR BALLOON ANGIOPLASTY;  Surgeon: [name], MD;  Location: [facility] INVASIVE CV LAB;  Service: Cardiovascular;  Laterality: Left;  Tibioperoneal trunk and superficial femoral arteries.  Marland Kitchen PERIPHERAL VASCULAR INTERVENTION  04/19/2019   Procedure: PERIPHERAL VASCULAR INTERVENTION;  Surgeon: [name], MD;  Location: [facility] INVASIVE CV LAB;  Service: Cardiovascular;;  right external iliac  . TEE WITHOUT CARDIOVERSION N/A 04/19/2019   Procedure:  TRANSESOPHAGEAL ECHOCARDIOGRAM (TEE);  Surgeon: [name], MD;  Location: [facility] ENDOSCOPY;  Service: Endoscopy;  Laterality: N/A;   Family History  Problem  Relation Age of Onset  . Colon cancer Neg Hx   . Esophageal cancer Neg Hx   . Pancreatic cancer Neg Hx   . Liver cancer Neg Hx   . Stomach cancer Neg Hx    Social History   Tobacco Use  . Smoking status: Current Every Day Smoker    Packs/day: 1.00    Types: Cigarettes  . Smokeless tobacco: Former Neurosurgeon  . Tobacco comment: 5 cigarrettes a day  Vaping Use  . Vaping Use: Never used  Substance Use Topics  . Alcohol use: No    Alcohol/week: 0.0 standard drinks  . Drug use: No   Current Outpatient Medications  Medication Sig Dispense Refill  . aspirin EC 81 MG tablet Take 1 tablet (81 mg total) by mouth daily. Swallow whole. 365 tablet 0  . atorvastatin (LIPITOR) 10 MG tablet Take 1 tablet (10 mg total) by mouth daily. 30 tablet 11  . clopidogrel (PLAVIX) 75 MG tablet Take 1 tablet (75 mg total) by mouth daily. 30 tablet 11  . Continuous Blood Gluc Sensor (DEXCOM G6 SENSOR) MISC 1 Device by Does not apply route as directed. 9 each 3  . Continuous Blood Gluc Transmit (DEXCOM G6 TRANSMITTER) MISC 1 Device by Does not apply route as directed. 1 each 3  . Heating Pads PADS Use over the counter heating pads as needed for back pain 1 Units 5  . insulin glargine (LANTUS SOLOSTAR) 100 UNIT/ML Solostar Pen Inject 20-34 Units into the skin daily. 34 mL 0  . insulin lispro (HUMALOG KWIKPEN) 100 UNIT/ML KwikPen Inject 5 Units into the skin 2 (two) times daily with a meal. 15 mL 11  . Insulin Pen Needle 32G X 4 MM MISC 1 Device by Does not apply route in the morning, at noon, and at bedtime. 300 each 3  . lisinopril (ZESTRIL) 20 MG tablet Take 1 tablet (20 mg total) by mouth daily. 90 tablet 3  . Omega-3 Fatty Acids (OMEGA-3 EPA FISH OIL PO) Take 2,000 Units by mouth daily.    Marland Kitchen omeprazole (PRILOSEC) 20 MG capsule Take 1 capsule (20 mg total) by mouth daily. 30 capsule 11   No current facility-administered medications for this visit.   No Known Allergies   Review of Systems: All systems  reviewed and negative except where noted in HPI.     Physical Exam:    Wt Readings from Last 3 Encounters:  09/19/20 244 lb (110.7 kg)  09/15/20 243 lb 8 oz (110.5 kg)  08/24/20 224 lb (101.6 kg)    BP 122/82   Pulse (!) 103   Ht 5\' 9"  (1.753 m)   Wt 244 lb (110.7 kg)   SpO2 96%   BMI 36.03 kg/m  Constitutional:  Pleasant, in no acute distress. Psychiatric: Normal mood and affect. Behavior is normal. EENT: Pupils normal.  Conjunctivae are normal. No scleral icterus. Neck supple. No cervical LAD. Cardiovascular: Normal rate, regular rhythm. No edema Pulmonary/chest: Effort normal and breath sounds normal. No wheezing, rales or rhonchi. Abdominal: Soft, nondistended, nontender. Bowel sounds active throughout. There are no masses palpable. No hepatomegaly. Neurological: Alert and oriented to person place and time. Skin: Skin is warm and dry. No rashes noted.   ASSESSMENT AND PLAN;   1) Abdominal bloating 2) Abdominal distention - EGD/colonoscopy to evaluate for mucosal/luminal pathology - Potentially 2/2  prior ABX exposure.  Starting probiotic and if work-up unrevealing and no significant clinical improvement, can send for SIBO breath testing  3) GERD 4) Obesity (BMI 36) - Discussed relationship of reflux and 40 pound weight gain over the last year - Continue Prilosec as prescribed - Continue antireflux lifestyle/dietary modifications - EGD to evaluate for erosive esophagitis, LES laxity, hiatal hernia  5) Abnormal CT - CT in 2020 with rectal wall thickening - Colonoscopy to evaluate further  6) Poor dentition - Patient has been undergoing serial tooth extractions with next appointment scheduled for 10/17/2020 -Plan for upper endoscopy to be done after completion of dental surgery due to risks of loose teeth and bite block needed for EGD  The indications, risks, and benefits of EGD and colonoscopy were explained to the patient in detail. Risks include but are not  limited to bleeding, perforation, adverse reaction to medications, and cardiopulmonary compromise. Sequelae include but are not limited to the possibility of surgery, hospitalization, and mortality. The patient verbalized understanding and wished to proceed. All questions answered, referred to scheduler and bowel prep ordered. Further recommendations pending results of the exam.      Shellia Cleverly, DO, FACG  09/19/2020, 11:12 AM   [place], Ramon Dredge, PA-C

## 2020-09-19 NOTE — Telephone Encounter (Signed)
Message left for patient to return my call.  

## 2020-09-19 NOTE — Telephone Encounter (Signed)
Spoken to [facility] and notified Dr Charlean Sanfilippo comments. Verbalized understanding.

## 2020-09-26 ENCOUNTER — Ambulatory Visit (INDEPENDENT_AMBULATORY_CARE_PROVIDER_SITE_OTHER)
Admit: 2020-09-26 | Discharge: 2020-09-26 | Disposition: A | Discharge status: Home / Self Care | Payer: Medicaid Other | Attending: Vascular Surgery | Admitting: Vascular Surgery

## 2020-09-26 ENCOUNTER — Ambulatory Visit (HOSPITAL_COMMUNITY)
Admission: RE | Admit: 2020-09-26 | Discharge: 2020-09-26 | Disposition: A | Discharge status: Home / Self Care | Payer: Medicaid Other | Source: Ambulatory Visit | Attending: Vascular Surgery | Admitting: Vascular Surgery

## 2020-09-26 ENCOUNTER — Ambulatory Visit (INDEPENDENT_AMBULATORY_CARE_PROVIDER_SITE_OTHER): Payer: Medicaid Other | Admitting: Physician Assistant

## 2020-09-26 ENCOUNTER — Other Ambulatory Visit: Payer: Self-pay

## 2020-09-26 VITALS — BP 139/87 | HR 95 | Temp 97.8°F | Resp 16 | Ht 68.0 in | Wt 245.0 lb

## 2020-09-26 DIAGNOSIS — I739 Peripheral vascular disease, unspecified: Secondary | ICD-10-CM

## 2020-09-26 NOTE — Progress Notes (Signed)
Office Note     CC:  follow up Requesting Provider:  [name], PA-C  HPI: Thomas [name] is a 45 y.o. (1975-06-04) male who presents status post right PTA and TP trunk angioplasty by Dr. Chestine Spore on 08/17/2020.  He then underwent left SFA angioplasty as well as below the knee popliteal, TP trunk, and proximal PTA angioplasty by Dr. Chestine Spore on 08/24/2020.  Surgical history also significant for right external iliac artery stent as well as right femoral to popliteal bypass which are both widely patent based on arteriogram.  The staged percutaneous interventions were performed due to bilateral lower extremity lifestyle limiting claudication.  Patient states he still has claudication symptoms after short distance however this may slightly be improved.  He denies any rest pain or nonhealing wounds of bilateral lower extremities.  He does have symmetrical numbness in his feet.  Past medical history also significant for insulin-dependent diabetes mellitus.  He stopped smoking in May of this year.  He is taking aspirin, Plavix, statin daily.  Past Medical History:  Diagnosis Date   Depression 01/27/2015   Diabetes mellitus without complication (HCC)    Hyperlipidemia    Hypertension    Peripheral vascular disease [facility])     Past Surgical History:  Procedure Laterality Date   ABDOMINAL AORTOGRAM W/LOWER EXTREMITY Right 04/19/2019   Procedure: ABDOMINAL AORTOGRAM W/LOWER EXTREMITY;  Surgeon: [name], MD;  Location: [facility] INVASIVE CV LAB;  Service: Cardiovascular;  Laterality: Right;   ABDOMINAL AORTOGRAM W/LOWER EXTREMITY Right 09/16/2019   Procedure: ABDOMINAL AORTOGRAM W/LOWER EXTREMITY;  Surgeon: [name], MD;  Location: [facility] INVASIVE CV LAB;  Service: Cardiovascular;  Laterality: Right;   ABDOMINAL AORTOGRAM W/LOWER EXTREMITY N/A 08/17/2020   Procedure: ABDOMINAL AORTOGRAM W/LOWER EXTREMITY;  Surgeon: [name], MD;  Location: [facility] INVASIVE CV LAB;  Service: Cardiovascular;   Laterality: N/A;   FEMORAL-TIBIAL BYPASS GRAFT Right 04/22/2019   Procedure: Right  FEMORAL-TIBIAL ARTERY BYPASS GRAFT with Harvest of Saphenous vein RIGHT SUPERFICIAL FEMORAL ARTERY TO TIBIOPERONEAL TRUNK BYPASS;  Surgeon: [name], MD;  Location: [facility] OR;  Service: Vascular;  Laterality: Right;   IR CERVICAL/THORACIC DISC ASPIRATION W/IMAG GUIDE  04/15/2019   IR FLUORO GUIDED NEEDLE PLC ASPIRATION/INJECTION LOC  04/15/2019   kidney stones  2009   PERIPHERAL VASCULAR BALLOON ANGIOPLASTY Right 09/16/2019   Procedure: PERIPHERAL VASCULAR BALLOON ANGIOPLASTY;  Surgeon: [name], MD;  Location: [facility] INVASIVE CV LAB;  Service: Cardiovascular;  Laterality: Right;  ext iliac DCB   PERIPHERAL VASCULAR BALLOON ANGIOPLASTY  08/17/2020   Procedure: PERIPHERAL VASCULAR BALLOON ANGIOPLASTY;  Surgeon: [name], MD;  Location: [facility] INVASIVE CV LAB;  Service: Cardiovascular;;  Right TP trunk   PERIPHERAL VASCULAR BALLOON ANGIOPLASTY Left 08/24/2020   Procedure: PERIPHERAL VASCULAR BALLOON ANGIOPLASTY;  Surgeon: [name], MD;  Location: [facility] INVASIVE CV LAB;  Service: Cardiovascular;  Laterality: Left;  Tibioperoneal trunk and superficial femoral arteries.   PERIPHERAL VASCULAR INTERVENTION  04/19/2019   Procedure: PERIPHERAL VASCULAR INTERVENTION;  Surgeon: [name], MD;  Location: [facility] INVASIVE CV LAB;  Service: Cardiovascular;;  right external iliac   TEE WITHOUT CARDIOVERSION N/A 04/19/2019   Procedure: TRANSESOPHAGEAL ECHOCARDIOGRAM (TEE);  Surgeon: [name], MD;  Location: [facility] ENDOSCOPY;  Service: Endoscopy;  Laterality: N/A;    Social History   Socioeconomic History   Marital status: Single    Spouse name: Not on file   Number of children: Not on file   Years of education: Not on file  Highest education level: Not on file  Occupational History   Occupation: Unemployed  Tobacco Use   Smoking status: Every Day    Packs/day: 1.00    Pack years:  0.00    Types: Cigarettes   Smokeless tobacco: Former   Tobacco comments:    5 cigarrettes a day  Vaping Use   Vaping Use: Never used  Substance and Sexual Activity   Alcohol use: No    Alcohol/week: 0.0 standard drinks   Drug use: No   Sexual activity: Not on file  Other Topics Concern   Not on file  Social History Narrative   Not on file   Social Determinants of Health   Financial Resource Strain: Not on file  Food Insecurity: Not on file  Transportation Needs: Not on file  Physical Activity: Not on file  Stress: Not on file  Social Connections: Not on file  Intimate Partner Violence: Not on file    Family History  Problem Relation Age of Onset   Colon cancer Neg Hx    Esophageal cancer Neg Hx    Pancreatic cancer Neg Hx    Liver cancer Neg Hx    Stomach cancer Neg Hx     Current Outpatient Medications  Medication Sig Dispense Refill   acetaminophen (TYLENOL) 500 MG tablet Take 500 mg by mouth every 6 (six) hours as needed.     aspirin EC 81 MG tablet Take 1 tablet (81 mg total) by mouth daily. Swallow whole. 365 tablet 0   atorvastatin (LIPITOR) 10 MG tablet Take 1 tablet (10 mg total) by mouth daily. 30 tablet 11   clopidogrel (PLAVIX) 75 MG tablet Take 1 tablet (75 mg total) by mouth daily. 30 tablet 11   Continuous Blood Gluc Sensor (DEXCOM G6 SENSOR) MISC 1 Device by Does not apply route as directed. 9 each 3   Continuous Blood Gluc Transmit (DEXCOM G6 TRANSMITTER) MISC 1 Device by Does not apply route as directed. 1 each 3   Heating Pads PADS Use over the counter heating pads as needed for back pain 1 Units 5   insulin glargine (LANTUS SOLOSTAR) 100 UNIT/ML Solostar Pen Inject 20-34 Units into the skin daily. 34 mL 0   insulin lispro (HUMALOG KWIKPEN) 100 UNIT/ML KwikPen Inject 5 Units into the skin 2 (two) times daily with a meal. 15 mL 11   Insulin Pen Needle 32G X 4 MM MISC 1 Device by Does not apply route in the morning, at noon, and at bedtime. 300 each 3    lisinopril (ZESTRIL) 20 MG tablet Take 1 tablet (20 mg total) by mouth daily. 90 tablet 3   Omega-3 Fatty Acids (OMEGA-3 EPA FISH OIL PO) Take 2,000 Units by mouth daily.     omeprazole (PRILOSEC) 20 MG capsule Take 1 capsule (20 mg total) by mouth daily. 30 capsule 11   No current facility-administered medications for this visit.    No Known Allergies   REVIEW OF SYSTEMS:   [X]  denotes positive finding, [ ]  denotes negative finding Cardiac  Comments:  Chest pain or chest pressure:    Shortness of breath upon exertion:    Short of breath when lying flat:    Irregular heart rhythm:        Vascular    Pain in calf, thigh, or hip brought on by ambulation:    Pain in feet at night that wakes you up from your sleep:     Blood clot in your veins:  Leg swelling:         Pulmonary    Oxygen at home:    Productive cough:     Wheezing:         Neurologic    Sudden weakness in arms or legs:     Sudden numbness in arms or legs:     Sudden onset of difficulty speaking or slurred speech:    Temporary loss of vision in one eye:     Problems with dizziness:         Gastrointestinal    Blood in stool:     Vomited blood:         Genitourinary    Burning when urinating:     Blood in urine:        Psychiatric    Major depression:         Hematologic    Bleeding problems:    Problems with blood clotting too easily:        Skin    Rashes or ulcers:        Constitutional    Fever or chills:      PHYSICAL EXAMINATION:  Vitals:   09/26/20 1055  BP: 139/87  Pulse: 95  Resp: 16  Temp: 97.8 F (36.6 C)  TempSrc: Temporal  SpO2: 98%  Weight: 245 lb (111.1 kg)  Height: 5\' 8"  (1.727 m)    General:  WDWN in NAD; vital signs documented above Gait: Not observed HENT: WNL, normocephalic Pulmonary: normal non-labored breathing , without Rales, rhonchi,  wheezing Cardiac: regular HR Abdomen: soft, NT, no masses Skin: without rashes Vascular Exam/Pulses:  Right Left   Radial 2+ (normal) 2+ (normal)  DP Brisk by doppler Brisk by doppler  PT Brisk by doppler Brisk by doppler   Extremities: without ischemic changes, without Gangrene , without cellulitis; without open wounds; no firm hematoma or palpable pseudoaneurysm bilateral groin access sites Musculoskeletal: no muscle wasting or atrophy  Neurologic: A&O X 3;  No focal weakness or paresthesias are detected Psychiatric:  The pt has Normal affect.   Non-Invasive Vascular Imaging:   Right external iliac stent widely patent Right femoropopliteal widely patent Right bypass outflow widely patent  Left mid SFA  263 cm/s Widely patent beyond SFA lesion  ABI/TBIToday's ABIToday's TBIPrevious ABIPrevious TBI  +-------+-----------+-----------+------------+------------+  Right  0.99       0.74       0.89        0.47          +-------+-----------+-----------+------------+------------+  Left   0.94       0.64       0.62        0.35          +-------+-----------+-----------+------------+------------+     ASSESSMENT/PLAN:: 45 y.o. male status post bilateral staged percutaneous revascularization  -Based on physical exam feet are symmetrically warm with brisk DP and PT signals bilaterally -Arterial duplex demonstrates improved blood flow compared to before percutaneous interventions bilaterally -Patient is still complaining of calf claudication after 1-2 blocks; encouraged patient to walk regularly daily to promote collateral flow and improve symptoms -Continue aspirin, Plavix, statin daily -Recheck arterial duplex bilateral with ABIs in 6 months   56, PA-C [facility] [phone]  Clinic MD:   [phone]

## 2020-09-27 ENCOUNTER — Other Ambulatory Visit: Payer: Self-pay

## 2020-09-27 DIAGNOSIS — I739 Peripheral vascular disease, unspecified: Secondary | ICD-10-CM

## 2020-11-02 ENCOUNTER — Ambulatory Visit: Payer: Medicaid Other | Admitting: Medical

## 2020-11-02 ENCOUNTER — Other Ambulatory Visit: Payer: Self-pay | Admitting: Medical

## 2020-11-06 ENCOUNTER — Ambulatory Visit: Payer: Medicaid Other | Admitting: Medical

## 2020-11-06 ENCOUNTER — Other Ambulatory Visit: Payer: Self-pay

## 2020-11-06 VITALS — BP 134/84 | HR 95 | Temp 98.1°F | Resp 18 | Ht 68.0 in | Wt 243.0 lb

## 2020-11-06 DIAGNOSIS — E1065 Type 1 diabetes mellitus with hyperglycemia: Secondary | ICD-10-CM

## 2020-11-06 DIAGNOSIS — K047 Periapical abscess without sinus: Secondary | ICD-10-CM

## 2020-11-06 DIAGNOSIS — I10 Essential (primary) hypertension: Secondary | ICD-10-CM | POA: Diagnosis not present

## 2020-11-06 DIAGNOSIS — K219 Gastro-esophageal reflux disease without esophagitis: Secondary | ICD-10-CM | POA: Diagnosis not present

## 2020-11-06 DIAGNOSIS — R635 Abnormal weight gain: Secondary | ICD-10-CM | POA: Diagnosis not present

## 2020-11-06 DIAGNOSIS — E785 Hyperlipidemia, unspecified: Secondary | ICD-10-CM | POA: Diagnosis not present

## 2020-11-06 MED ORDER — AMOXICILLIN 500 MG PO CAPS: 500.0000 mg | ORAL_CAPSULE | Freq: Three times a day (TID) | ORAL | 0 refills | Status: AC

## 2020-11-06 NOTE — Progress Notes (Signed)
Subjective:    Patient ID: Thomas [name], male    DOB: Nov 04, 1975, 45 y.o.   MRN: [id number]  HPI  Pt in for follow up.  Pt has hx of dental extractions. Pt has intermittent left upper side face swelling. He had procedure to remove various teeth. He tells me thru translator left upper teeth extracted around juy 5th.Oral surgeon has given him amoxicillin after the surgery. 4 days ago the left upper cheek area mild swollen. He wants refill of amoxicillin. No fever, no chills or sweats.   Pt has been gaining wt. He states he is eating a lot.    Pt is diabetic and he is now seeing Dr. [name]. Plan on meds.   MEDICATIONS: Start Humalog 5 units with each meal Decrease Lantus to 30 units daily   Pt tells me fiasp (jnsulin aspart injection)was not available. They told him would be 2 months would fill.   Pt has high cholesterol. He is on atorvastatin.  Hx of htn. Good blood control with lisinopril 20 mg daily.  Pt has hx of gerd. On omeprazole. Now symptoms well controlled.       Review of Systems  Constitutional:  Negative for chills, fatigue and fever.  HENT:  Negative for dental problem.   Respiratory:  Negative for cough, chest tightness, shortness of breath and wheezing.   Cardiovascular:  Negative for chest pain and palpitations.  Gastrointestinal:  Negative for abdominal pain, blood in stool, constipation, diarrhea and nausea.  Genitourinary:  Negative for difficulty urinating, dysuria, enuresis, flank pain, frequency, genital sores and hematuria.  Musculoskeletal:  Negative for back pain.   Past Medical History:  Diagnosis Date   Depression 01/27/2015   Diabetes mellitus without complication (HCC)    Hyperlipidemia    Hypertension    Peripheral vascular disease (HCC)      Social History   Socioeconomic History   Marital status: Single    Spouse name: Not on file   Number of children: Not on file   Years of education: Not on file   Highest education  level: Not on file  Occupational History   Occupation: Unemployed  Tobacco Use   Smoking status: Every Day    Packs/day: 1.00    Types: Cigarettes   Smokeless tobacco: Former   Tobacco comments:    5 cigarrettes a day  Vaping Use   Vaping Use: Never used  Substance and Sexual Activity   Alcohol use: No    Alcohol/week: 0.0 standard drinks   Drug use: No   Sexual activity: Not on file  Other Topics Concern   Not on file  Social History Narrative   Not on file   Social Determinants of Health   Financial Resource Strain: Not on file  Food Insecurity: Not on file  Transportation Needs: Not on file  Physical Activity: Not on file  Stress: Not on file  Social Connections: Not on file  Intimate Partner Violence: Not on file    Past Surgical History:  Procedure Laterality Date   ABDOMINAL AORTOGRAM W/LOWER EXTREMITY Right 04/19/2019   Procedure: ABDOMINAL AORTOGRAM W/LOWER EXTREMITY;  Surgeon: [name], MD;  Location: [facility] INVASIVE CV [facility];  Service: Cardiovascular;  Laterality: Right;   ABDOMINAL AORTOGRAM W/LOWER EXTREMITY Right 09/16/2019   Procedure: ABDOMINAL AORTOGRAM W/LOWER EXTREMITY;  Surgeon: [name], MD;  Location: [facility];  Service: Cardiovascular;  Laterality: Right;   ABDOMINAL AORTOGRAM W/LOWER EXTREMITY N/A 08/17/2020   Procedure: ABDOMINAL AORTOGRAM W/LOWER EXTREMITY;  Surgeon: [name], MD;  Location: [facility] INVASIVE CV LAB;  Service: Cardiovascular;  Laterality: N/A;   FEMORAL-TIBIAL BYPASS GRAFT Right 04/22/2019   Procedure: Right  FEMORAL-TIBIAL ARTERY BYPASS GRAFT with Harvest of Saphenous vein RIGHT SUPERFICIAL FEMORAL ARTERY TO TIBIOPERONEAL TRUNK BYPASS;  Surgeon: [name], MD;  Location: [facility] OR;  Service: Vascular;  Laterality: Right;   IR CERVICAL/THORACIC DISC ASPIRATION W/IMAG GUIDE  04/15/2019   IR FLUORO GUIDED NEEDLE PLC ASPIRATION/INJECTION LOC  04/15/2019   kidney stones  2009   PERIPHERAL VASCULAR  BALLOON ANGIOPLASTY Right 09/16/2019   Procedure: PERIPHERAL VASCULAR BALLOON ANGIOPLASTY;  Surgeon: [name], MD;  Location: [facility];  Service: Cardiovascular;  Laterality: Right;  ext iliac DCB   PERIPHERAL VASCULAR BALLOON ANGIOPLASTY  08/17/2020   Procedure: PERIPHERAL VASCULAR BALLOON ANGIOPLASTY;  Surgeon: [name], MD;  Location: [facility];  Service: Cardiovascular;;  Right TP trunk   PERIPHERAL VASCULAR BALLOON ANGIOPLASTY Left 08/24/2020   Procedure: PERIPHERAL VASCULAR BALLOON ANGIOPLASTY;  Surgeon: [name], MD;  Location: [facility];  Service: Cardiovascular;  Laterality: Left;  Tibioperoneal trunk and superficial femoral arteries.   PERIPHERAL VASCULAR INTERVENTION  04/19/2019   Procedure: PERIPHERAL VASCULAR INTERVENTION;  Surgeon: [name], MD;  Location: [facility] INVASIVE CV LAB;  Service: Cardiovascular;;  right external iliac   TEE WITHOUT CARDIOVERSION N/A 04/19/2019   Procedure: TRANSESOPHAGEAL ECHOCARDIOGRAM (TEE);  Surgeon: [name], MD;  Location: [facility] ENDOSCOPY;  Service: Endoscopy;  Laterality: N/A;    Family History  Problem Relation Age of Onset   Colon cancer Neg Hx    Esophageal cancer Neg Hx    Pancreatic cancer Neg Hx    Liver cancer Neg Hx    Stomach cancer Neg Hx     No Known Allergies  Current Outpatient Medications on File Prior to Visit  Medication Sig Dispense Refill   acetaminophen (TYLENOL) 500 MG tablet Take 500 mg by mouth every 6 (six) hours as needed.     aspirin EC 81 MG tablet Take 1 tablet (81 mg total) by mouth daily. Swallow whole. 365 tablet 0   atorvastatin (LIPITOR) 10 MG tablet Take 1 tablet (10 mg total) by mouth daily. 30 tablet 11   clopidogrel (PLAVIX) 75 MG tablet TAKE 1 TABLET BY MOUTH DAILY WITH BREAKFAST 90 tablet 2   Continuous Blood Gluc Sensor (DEXCOM G6 SENSOR) MISC 1 Device by Does not apply route as directed. 9 each 3   Continuous Blood Gluc  Transmit (DEXCOM G6 TRANSMITTER) MISC 1 Device by Does not apply route as directed. 1 each 3   Heating Pads PADS Use over the counter heating pads as needed for back pain 1 Units 5   insulin glargine (LANTUS SOLOSTAR) 100 UNIT/ML Solostar Pen Inject 20-34 Units into the skin daily. 34 mL 0   insulin lispro (HUMALOG KWIKPEN) 100 UNIT/ML KwikPen Inject 5 Units into the skin 2 (two) times daily with a meal. 15 mL 11   Insulin Pen Needle 32G X 4 MM MISC 1 Device by Does not apply route in the morning, at noon, and at bedtime. 300 each 3   lisinopril (ZESTRIL) 20 MG tablet Take 1 tablet (20 mg total) by mouth daily. 90 tablet 3   Omega-3 Fatty Acids (OMEGA-3 EPA FISH OIL PO) Take 2,000 Units by mouth daily.     omeprazole (PRILOSEC) 20 MG capsule Take 1 capsule (20 mg total) by mouth daily. 30 capsule 11  No current facility-administered medications on file prior to visit.    BP 134/84   Pulse 95   Temp 98.1 F (36.7 C)   Resp 18   Ht 5\' 8"  (1.727 m)   Wt 243 lb (110.2 kg)   SpO2 95%   BMI 36.95 kg/m        Objective:   Physical Exam  General Mental Status- Alert. General Appearance- Not in acute distress.   Skin General: Color- Normal Color. Moisture- Normal Moisture.  Neck Carotid Arteries- Normal color. Moisture- Normal Moisture. No carotid bruits. No JVD.  Chest and Lung Exam Auscultation: Breath Sounds:-Normal.  Cardiovascular Auscultation:Rythm- Regular. Murmurs & Other Heart Sounds:Auscultation of the heart reveals- No Murmurs.  Abdomen Inspection:-Inspeection Normal. Palpation/Percussion:Note:No mass. Palpation and Percussion of the abdomen reveal- Non Tender, Non Distended + BS, no rebound or guarding.   Neurologic Cranial Nerve exam:- CN III-XII intact(No nystagmus), symmetric smile. Drift Test:- No drift. Romberg Exam:- Negative.  Heal to Toe Gait exam:-Normal. Finger to Nose:- Normal/Intact Strength:- 5/5 equal and symmetric strength both upper and  lower extremities.       Assessment & Plan:   For history of tooth extraction and intermittent swollen left side cheek rx'd amoxicillin. Call oral surgeon and explain to him about swelling.  Hypertension well controlled.  Continue lisinopril 20 mg daily.   History of GERD and recently well controlled with omeprazole.  Refilled omeprazole 20 mg Rx today.  History of diabetes with elevated A1c in the past.  We will go ahead and get A1c today and send results to your endocrinologist.  Also will send your endocrinologist note regarding the fact that insulin she prescribed is not available.  We will see if she wants to to return to  Humalog mealtime insulin.  Presently continue Lantus at night.  Recent weight gain over the last 2 to 3 months.  Discussed healthy diet.  Watch urine to reduce her carbohydrates and reduce cholesterol in your diet as we discussed.  History of hyperlipidemia and refilled your atorvastatin.  For history of peripheral vascular disease prescribed Plavix.   Follow-up in 3 months or as needed.  , PA-C   Time spent with patient today(with translator interpretting)was  47 minutes which consisted of chart review, discussing diagnoses, work up treatment, answering question and documentation.

## 2020-11-06 NOTE — Patient Instructions (Addendum)
For history of tooth extraction and intermittent swollen left side cheek rx'd amoxicillin. Call oral surgeon and explain to him about swelling.  Hypertension well controlled.  Continue lisinopril 20 mg daily.   History of GERD and recently well controlled with omeprazole.  Refilled omeprazole 20 mg Rx today.  History of diabetes with elevated A1c in the past.  We will go ahead and get A1c today and send results to your endocrinologist.  Also will send your endocrinologist note regarding the fact that insulin she prescribed is not available.  We will see if she wants to to return to  Humalog mealtime insulin.  Presently continue Lantus at night.  Recent weight gain over the last 2 to 3 months.  Discussed healthy diet.  Watch urine to reduce her carbohydrates and reduce cholesterol in your diet as we discussed.  History of hyperlipidemia and refilled your atorvastatin.  For history of peripheral vascular disease prescribed Plavix.   Follow-up in 3 months or as needed.

## 2020-11-07 LAB — COMPREHENSIVE METABOLIC PANEL
ALT: 15 U/L (ref 0–53)
AST: 12 U/L (ref 0–37)
Albumin: 4.2 g/dL (ref 3.5–5.2)
Alkaline Phosphatase: 97 U/L (ref 39–117)
BUN: 17 mg/dL (ref 6–23)
CO2: 27 mEq/L (ref 19–32)
Calcium: 9.7 mg/dL (ref 8.4–10.5)
Chloride: 103 mEq/L (ref 96–112)
Creatinine, Ser: 0.99 mg/dL (ref 0.40–1.50)
GFR: 92.96 mL/min (ref >60.00)
Glucose, Bld: 177 mg/dL — ABNORMAL HIGH (ref 70–99)
Potassium: 4.7 mEq/L (ref 3.5–5.1)
Sodium: 139 mEq/L (ref 135–145)
Total Bilirubin: 0.5 mg/dL (ref 0.2–1.2)
Total Protein: 7.4 g/dL (ref 6.0–8.3)

## 2020-11-07 LAB — LIPID PANEL
Cholesterol: 191 mg/dL (ref 0–200)
HDL: 36.4 mg/dL — ABNORMAL LOW (ref >39.00)
NonHDL: 154.51
Total CHOL/HDL Ratio: 5
Triglycerides: 219 mg/dL — ABNORMAL HIGH (ref 0.0–149.0)
VLDL: 43.8 mg/dL — ABNORMAL HIGH (ref 0.0–40.0)

## 2020-11-07 LAB — LDL CHOLESTEROL, DIRECT: Direct LDL: 113 mg/dL

## 2020-11-07 LAB — HEMOGLOBIN A1C: Hgb A1c MFr Bld: 8.6 % — ABNORMAL HIGH (ref 4.6–6.5)

## 2020-11-16 ENCOUNTER — Other Ambulatory Visit: Payer: Self-pay

## 2020-11-16 ENCOUNTER — Encounter: Payer: Self-pay | Admitting: Dietician

## 2020-11-16 ENCOUNTER — Encounter: Payer: Medicaid Other | Attending: Medical | Admitting: Dietician

## 2020-11-16 DIAGNOSIS — E1159 Type 2 diabetes mellitus with other circulatory complications: Secondary | ICD-10-CM | POA: Insufficient documentation

## 2020-11-16 DIAGNOSIS — Z794 Long term (current) use of insulin: Secondary | ICD-10-CM | POA: Insufficient documentation

## 2020-11-16 NOTE — Patient Instructions (Signed)
Check your blood sugar when you wake up each morning. Give the Lantus each morning. Eat breakfast  Include protein when you eat  Soft proteins include yogurt, lentils, beans, eggs, cheese Make vegetable soup and blend if needed.

## 2020-11-16 NOTE — Progress Notes (Signed)
Diabetes Self-Management Education  Visit Type: First/Initial  Appt. Start Time: 0915 Appt. End Time: 1030  11/20/2020  [name], identified by name and date of birth, is a 45 y.o. male with a diagnosis of Diabetes: Type 2.   ASSESSMENT Patient is here with Hanan his interpretor.  Patient speaks Aerobic.   Patient would like to learn more about what foods he can eat since his teeth have been removed.  History includes Type 2 Diabetes (2008), HTN, dyslipidemia, PVD, PVD, depression, all teeth were removed in July Smokes, eats 2 meals per day. Labs:  11/06/2020:  Cholesterol 191, HDL 36, LDL 113, Triglycerides 219, A1c 8.6%, GFR 92  Medication includes Lantus 34 units each am (patient takes this dose when his blood sugar is high but splits it between morning and evening when his blood glucose is lower in the morning). He is not taking the Humalog yet as the Health Department has not yet approved this to obtain for free.  Patient lives alone.  He is from [place]. Patient states that he has not worked since 2020 due to health and injuries.  He states that not working has caused him depression.  He does feel alone as his friends had to leave as he cannot support them. He is not on disability. He does receive food stamps.  Weight 241 lb (109.3 kg). Body mass index is 36.64 kg/m.   Diabetes Self-Management Education - 11/16/20 0943       Visit Information   Visit Type First/Initial      Initial Visit   Diabetes Type Type 2    Are you currently following a meal plan? No    Are you taking your medications as prescribed? Yes    Date Diagnosed 2008      Health Coping   How would you rate your overall health? Good      Psychosocial Assessment   Patient Belief/Attitude about Diabetes Afraid    Self-care barriers Lack of material resources;English as a second language    Self-management support Doctor's office    Other persons present Patient;Interpreter    Patient Concerns  Nutrition/Meal planning    Special Needs Other (comment)   with interpretor   Preferred Learning Style No preference indicated    Learning Readiness Ready    How often do you need to have someone help you when you read instructions, pamphlets, or other written materials from your doctor or pharmacy? 5 - Always    What is the last grade level you completed in school? elementary      Pre-Education Assessment   Patient understands the diabetes disease and treatment process. Needs Instruction    Patient understands incorporating nutritional management into lifestyle. Needs Instruction    Patient undertands incorporating physical activity into lifestyle. Needs Instruction    Patient understands using medications safely. Needs Instruction    Patient understands monitoring blood glucose, interpreting and using results Needs Instruction    Patient understands prevention, detection, and treatment of acute complications. Needs Instruction    Patient understands prevention, detection, and treatment of chronic complications. Needs Instruction    Patient understands how to develop strategies to address psychosocial issues. Needs Instruction    Patient understands how to develop strategies to promote health/change behavior. Needs Instruction      Complications   Last HgB A1C per patient/outside source 8.6 %   11/06/2020   How often do you check your blood sugar? 1-2 times/day    Fasting Blood glucose range (  mg/dL) --   doesn't check   Postprandial Blood glucose range (mg/dL) 604-540    Number of hypoglycemic episodes per month 2    Can you tell when your blood sugar is low? Yes    What do you do if your blood sugar is low? eats chocollate or drinks soda      Dietary Intake   Breakfast [employer] toast with cheese, occasional banana    Dinner Okra soup, lentils, rice, yogurt    Beverage(s) water, milk, coffee or chai tea      Exercise   Exercise Type ADL's      Patient Education   Previous Diabetes  Education No    Nutrition management  Role of diet in the treatment of diabetes and the relationship between the three main macronutrients and blood glucose level    Physical activity and exercise  Role of exercise on diabetes management, blood pressure control and cardiac health.    Medications Reviewed patients medication for diabetes, action, purpose, timing of dose and side effects.    Monitoring Identified appropriate SMBG and/or A1C goals.    Chronic complications Relationship between chronic complications and blood glucose control;Identified and discussed with patient  current chronic complications    Psychosocial adjustment Identified and addressed patients feelings and concerns about diabetes;Worked with patient to identify barriers to care and solutions;Role of stress on diabetes      Individualized Goals (developed by patient)   Nutrition General guidelines for healthy choices and portions discussed    Physical Activity Exercise 5-7 days per week    Medications take my medication as prescribed    Monitoring  test my blood glucose as discussed      Post-Education Assessment   Patient understands the diabetes disease and treatment process. Demonstrates understanding / competency    Patient understands incorporating nutritional management into lifestyle. Needs Review    Patient undertands incorporating physical activity into lifestyle. Demonstrates understanding / competency    Patient understands using medications safely. Demonstrates understanding / competency    Patient understands monitoring blood glucose, interpreting and using results Needs Review    Patient understands prevention, detection, and treatment of acute complications. Demonstrates understanding / competency    Patient understands prevention, detection, and treatment of chronic complications. Demonstrates understanding / competency    Patient understands how to develop strategies to address psychosocial issues. Needs  Review    Patient understands how to develop strategies to promote health/change behavior. Needs Review      Outcomes   Expected Outcomes Demonstrated interest in learning. Expect positive outcomes    Future DMSE 2 months    Program Status Not Completed             Individualized Plan for Diabetes Self-Management Training:   Learning Objective:  Patient will have a greater understanding of diabetes self-management. Patient education plan is to attend individual and/or group sessions per assessed needs and concerns.   Plan:   Patient Instructions  Check your blood sugar when you wake up each morning. Give the Lantus each morning. Eat breakfast  Include protein when you eat  Soft proteins include yogurt, lentils, beans, eggs, cheese Make vegetable soup and blend if needed.  Expected Outcomes:  Demonstrated interest in learning. Expect positive outcomes  Education material provided: Handouts in Arabic: All about blood glucose for people with type 2 diabetes All about insulin resistance Getting started with physical activity Protect your heart by losing weight and heart healthy foods Wise food choices  If problems or questions, patient to contact team via:  Phone  Future DSME appointment: 2 months

## 2020-12-06 ENCOUNTER — Other Ambulatory Visit: Payer: Self-pay | Admitting: Medical

## 2020-12-22 ENCOUNTER — Other Ambulatory Visit: Payer: Self-pay

## 2020-12-22 ENCOUNTER — Encounter: Payer: Self-pay | Admitting: Internal Medicine

## 2020-12-22 ENCOUNTER — Telehealth: Payer: Self-pay | Admitting: Internal Medicine

## 2020-12-22 ENCOUNTER — Ambulatory Visit (INDEPENDENT_AMBULATORY_CARE_PROVIDER_SITE_OTHER): Payer: Medicaid Other | Admitting: Internal Medicine

## 2020-12-22 VITALS — BP 140/90 | HR 94 | Ht 68.0 in | Wt 241.2 lb

## 2020-12-22 DIAGNOSIS — Z794 Long term (current) use of insulin: Secondary | ICD-10-CM

## 2020-12-22 DIAGNOSIS — E1151 Type 2 diabetes mellitus with diabetic peripheral angiopathy without gangrene: Secondary | ICD-10-CM | POA: Diagnosis not present

## 2020-12-22 DIAGNOSIS — E785 Hyperlipidemia, unspecified: Secondary | ICD-10-CM | POA: Diagnosis not present

## 2020-12-22 DIAGNOSIS — E1165 Type 2 diabetes mellitus with hyperglycemia: Secondary | ICD-10-CM | POA: Diagnosis not present

## 2020-12-22 DIAGNOSIS — E1142 Type 2 diabetes mellitus with diabetic polyneuropathy: Secondary | ICD-10-CM

## 2020-12-22 LAB — GLUCOSE, POCT (MANUAL RESULT ENTRY): POC Glucose: 162 mg/dl — AB (ref 70–99)

## 2020-12-22 MED ORDER — LANTUS SOLOSTAR 100 UNIT/ML ~~LOC~~ SOPN: 34.0000 [IU] | PEN_INJECTOR | Freq: Every day | SUBCUTANEOUS | 3 refills | Status: DC

## 2020-12-22 MED ORDER — DEXCOM G6 SENSOR MISC: 1.0000 | 3 refills | Status: DC

## 2020-12-22 MED ORDER — LANTUS SOLOSTAR 100 UNIT/ML ~~LOC~~ SOPN: 34.0000 [IU] | PEN_INJECTOR | Freq: Every day | SUBCUTANEOUS | 1 refills | Status: DC

## 2020-12-22 MED ORDER — INSULIN LISPRO (1 UNIT DIAL) 100 UNIT/ML (KWIKPEN): 10.0000 [IU] | PEN_INJECTOR | Freq: Two times a day (BID) | SUBCUTANEOUS | 3 refills | Status: DC

## 2020-12-22 MED ORDER — DEXCOM G6 TRANSMITTER MISC: 1.0000 | 3 refills | Status: DC

## 2020-12-22 MED ORDER — INSULIN PEN NEEDLE 29G X 5MM MISC: 1.0000 | Freq: Four times a day (QID) | 3 refills | Status: DC

## 2020-12-22 MED ORDER — INSULIN LISPRO (1 UNIT DIAL) 100 UNIT/ML (KWIKPEN): 10.0000 [IU] | PEN_INJECTOR | Freq: Three times a day (TID) | SUBCUTANEOUS | 3 refills | Status: DC

## 2020-12-22 MED ORDER — ATORVASTATIN CALCIUM 20 MG PO TABS: 20.0000 mg | ORAL_TABLET | Freq: Every day | ORAL | 3 refills | Status: DC

## 2020-12-22 NOTE — Patient Instructions (Addendum)
-   Continue Lantus 34 units daily  - Continue HUmalog 10 units with each meal     Please check with [facility] about the Dexcom prescription. Let me know when you get it , so we can ask Vernona Rieger to train you on use.     HOW TO TREAT LOW BLOOD SUGARS (Blood sugar LESS THAN 70 MG/DL) Please follow the RULE OF 15 for the treatment of hypoglycemia treatment (when your (blood sugars are less than 70 mg/dL)   STEP 1: Take 15 grams of carbohydrates when your blood sugar is low, which includes:  3-4 GLUCOSE TABS  OR 3-4 OZ OF JUICE OR REGULAR SODA OR ONE TUBE OF GLUCOSE GEL    STEP 2: RECHECK blood sugar in 15 MINUTES STEP 3: If your blood sugar is still low at the 15 minute recheck --> then, go back to STEP 1 and treat AGAIN with another 15 grams of carbohydrates.

## 2020-12-22 NOTE — Telephone Encounter (Signed)
Can you please contact the [facility] health department and make sure that they received my prescription?   I am not used to sending him any prescriptions, but they give the patient 1 pen of Humalog which is not going to last him much, and I just want a make sure that they have the correct prescriptions of insulin    Thank you

## 2020-12-22 NOTE — Progress Notes (Signed)
Name: Thomas [name]  Age/ Sex: 45 y.o., male   MRN/ DOB: [id number], 23-Jun-1975     PCP: Marisue BrooklynSaguier, Edward, PA-C   Reason for Endocrinology Evaluation: Type 2 Diabetes Mellitus  Initial Endocrine Consultative Visit: 09/15/2020    PATIENT IDENTIFIER: Thomas [name] is a 45 y.o. male with a past medical history of T2DM, HTN, and dyslipidemia. The patient has followed with Endocrinology clinic since 09/15/2020 for consultative assistance with management of his diabetes.  DIABETIC HISTORY:  Thomas [name] was diagnosed with DM in 2008 .  He was on metformin in the past. His hemoglobin A1c has ranged from 6.7% in 2021, peaking at 15.8% in 2020  On his initial visit with me he had an A1c of 11.3%, he was only on basal insulin, he was having a lot of heartburn, and GI symptoms and we opted to start him on prandial insulin   SUBJECTIVE:   During the last visit (09/15/2020): A1c 11.3%, we continued basal insulin and started prandial insulin  Today (12/22/2020): Thomas [name] is here for follow-up on diabetes management.  He checks his blood sugars 1 times daily. The patient has not had hypoglycemic episodes since the last clinic visit.  His GI symptoms have resolved and is not having any vomiting or abdominal pain He denies any shortness of breath  He is not aware of any status updates on his Dexcom   [facility] but they only gave him 1 pen of insulin a month ago and he has been only using Humalog once a day due to limited quantities.  HOME DIABETES REGIMEN:  Humalog 10 units with each meal -has only been taking it once a day Lantus 34  units daily     Statin: Yes ACE-I/ARB: Yes Prior Diabetic Education: Yes    DIABETIC COMPLICATIONS: Microvascular complications:   Denies: CKD, neuropathy, retinopathy Last Eye Exam: Completed 03/2020  Macrovascular complications:  PVD ( S/P right external iliac angioplasty and stent (04/19/2019)  followed by bypass 08/2020 Denies: CAD, CVA   HISTORY:  Past Medical History:  Past Medical History:  Diagnosis Date   Depression 01/27/2015   Diabetes mellitus without complication (HCC)    Hyperlipidemia    Hypertension    Peripheral vascular disease (HCC)    Past Surgical History:  Past Surgical History:  Procedure Laterality Date   ABDOMINAL AORTOGRAM W/LOWER EXTREMITY Right 04/19/2019   Procedure: ABDOMINAL AORTOGRAM W/LOWER EXTREMITY;  Surgeon: Cephus Shellinglark, Christopher J, MD;  Location: [facility] INVASIVE CV LAB;  Service: Cardiovascular;  Laterality: Right;   ABDOMINAL AORTOGRAM W/LOWER EXTREMITY Right 09/16/2019   Procedure: ABDOMINAL AORTOGRAM W/LOWER EXTREMITY;  Surgeon: Cephus Shellinglark, Christopher J, MD;  Location: [facility] INVASIVE CV [facility];  Service: Cardiovascular;  Laterality: Right;   ABDOMINAL AORTOGRAM W/LOWER EXTREMITY N/A 08/17/2020   Procedure: ABDOMINAL AORTOGRAM W/LOWER EXTREMITY;  Surgeon: Cephus Shellinglark, Christopher J, MD;  Location: [facility] INVASIVE CV LAB;  Service: Cardiovascular;  Laterality: N/A;   FEMORAL-TIBIAL BYPASS GRAFT Right 04/22/2019   Procedure: Right  FEMORAL-TIBIAL ARTERY BYPASS GRAFT with Harvest of Saphenous vein RIGHT SUPERFICIAL FEMORAL ARTERY TO TIBIOPERONEAL TRUNK BYPASS;  Surgeon: Cephus Shellinglark, Christopher J, MD;  Location: [facility] OR;  Service: Vascular;  Laterality: Right;   IR CERVICAL/THORACIC DISC ASPIRATION W/IMAG GUIDE  04/15/2019   IR FLUORO GUIDED NEEDLE PLC ASPIRATION/INJECTION LOC  04/15/2019   kidney stones  2009   PERIPHERAL VASCULAR BALLOON ANGIOPLASTY Right 09/16/2019   Procedure: PERIPHERAL VASCULAR BALLOON ANGIOPLASTY;  Surgeon: Cephus Shellinglark, Christopher J, MD;  [facility]  INVASIVE CV LAB;  Service: Cardiovascular;  Laterality: Right;  ext iliac DCB   PERIPHERAL VASCULAR BALLOON ANGIOPLASTY  08/17/2020   Procedure: PERIPHERAL VASCULAR BALLOON ANGIOPLASTY;  Surgeon: [name], MD;  Location: [facility] INVAS[facility] CV LAB;  Service: Cardiovascular;;  Right TP trunk   PERIPHERAL VASCULAR BALLOON  ANGIOPLASTY Left 08/24/2020   Procedure: PERIPHERAL VASCULAR BALLOON ANGIOPLASTY;  Surgeon: [name], MD;  Location: [facility] IN[facility]IVE CV LAB;  Service: Cardiovascular;  Laterality: Left;  Tibioperoneal trunk and superficial femoral arteries.   PERIPHERAL VASCULAR INTERVENTION  04/19/2019   Procedure: PERIPHERAL VASCULAR INTERVENTION;  Surgeon: [name], MD;  Location: [facility] INVASIVE CV LAB;  Service: Cardiovascular;;  right external iliac   TEE WITHOUT CARDIOVERSION N/A 04/19/2019   Procedure: TRANSESOPHAGEAL ECHOCARDIOGRAM (TEE);  Surgeon: [name], MD;  Location: [facility] ENDOSCOPY;  Service: Endoscopy;  Laterality: N/A;   Social History:  reports that he has been smoking cigarettes. He has been smoking an average of 1 pack per day. He has quit using smokeless tobacco. He reports that he does not drink alcohol and does not use drugs. Family History:  Family History  Problem Relation Age of Onset   Colon cancer Neg Hx    Esophageal cancer Neg Hx    Pancreatic cancer Neg Hx    Liver cancer Neg Hx    Stomach cancer Neg Hx      HOME MEDICATIONS: Allergies as of 12/22/2020   No Known Allergies      Medication List        Accurate as of December 22, 2020 10:13 AM. If you have any questions, ask your nurse or doctor.          acetaminophen 500 MG tablet Commonly known as: TYLENOL Take 500 mg by mouth every 6 (six) hours as needed.   aspirin EC 81 MG tablet Take 1 tablet (81 mg total) by mouth daily. Swallow whole.   atorvastatin 10 MG tablet Commonly known as: LIPITOR Take 1 tablet (10 mg total) by mouth daily.   clopidogrel 75 MG tablet Commonly known as: PLAVIX TAKE 1 TABLET BY MOUTH DAILY WITH BREAKFAST   Dexcom G6 Sensor Misc 1 Device by Does not apply route as directed.   Dexcom G6 Transmitter Misc 1 Device by Does not apply route as directed.   Heating Pads Pads Use over the counter heating pads as needed for back pain   insulin lispro 100  UNIT/ML KwikPen Commonly known as: HumaLOG KwikPen Inject 5 Units into the skin 2 (two) times daily with a meal.   Insulin Pen Needle 32G X 4 MM Misc 1 Device by Does not apply route in the morning, at noon, and at bedtime.   Lantus SoloStar 100 UNIT/ML Solostar Pen Generic drug: insulin glargine INJECT 20 TO 34 UNITS SUBCUTANEOUSLY ONCE DAILY   lisinopril 20 MG tablet Commonly known as: ZESTRIL Take 1 tablet (20 mg total) by mouth daily.   OMEGA-3 EPA FISH OIL PO Take 2,000 Units by mouth daily.   omeprazole 20 MG capsule Commonly known as: PRILOSEC Take 1 capsule (20 mg total) by mouth daily.   OVER THE COUNTER MEDICATION [employer] Ultra Sugar aid  Beet gummies  Arginine and Citrulline         OBJECTIVE:   Vital Signs: BP 140/90 (BP Location: Left Arm, Patient Position: Sitting, Cuff Size: Large)   Pulse 94   Ht 5\' 8"  (1.727 m)   Wt 241 lb 3.2 oz (109.4 kg)  SpO2 99%   BMI 36.67 kg/m   Wt Readings from Last 3 Encounters:  12/22/20 241 lb 3.2 oz (109.4 kg)  11/16/20 241 lb (109.3 kg)  11/06/20 243 lb (110.2 kg)     Exam: General: Pt appears well and is in NAD  Lungs: Clear with good BS bilat   Heart: RRR   Abdomen: Normoactive bowel sounds, soft, nontender, without masses or organomegaly palpable  Extremities: No pretibial edema.   Neuro: MS is good with appropriate affect, pt is alert and Ox3      DATA REVIEWED:  Lab Results  Component Value Date   HGBA1C 8.6 (H) 11/06/2020   HGBA1C 11.3 (H) 08/03/2020   HGBA1C 10.1 Repeated and verified X2. (H) 05/05/2020   Lab Results  Component Value Date   MICROALBUR 27.0 11/26/2018   LDLCALC 108 (H) 08/03/2020   CREATININE 0.99 11/06/2020   No results found for: [facility]   Lab Results  Component Value Date   CHOL 191 11/06/2020   HDL 36.40 (L) 11/06/2020   LDLCALC 108 (H) 08/03/2020   LDLDIRECT 113.0 11/06/2020   TRIG 219.0 (H) 11/06/2020   CHOLHDL 5 11/06/2020          ASSESSMENT / PLAN / RECOMMENDATIONS:   1) Type 2 Diabetes Mellitus, with improving glycemic control , With neuropathic and macrovascular complications - Most recent A1c of 8.6%. Goal A1c <7.0%.     -I have praised the patient on improved glycemic control with an A1c down from 11.3% -He received his insulin from the [facility] health department and he was only given 1 pen of Humalog a month ago, due to fear of running out too soon he is only been using it once a day with his first meal of the day -I am going to update his prescription -On his last visit I held off on starting metformin or GLP-1 agonist due to uncontrolled GERD and vomiting, but this seems to have resolved.  I will continue to hold off on these 2 agents for now -We will consider starting SGLT2 inhibitors on next visit -I have prescribed Dexcom to his pharmacy on the last visit, today he tells me he never received a call from the pharmacy.  I explained to the patient that Dexcom sensor and receiver have been approved by his pharmacy and he needs to follow-up at [facility] -Patient advised to contact our office when he receives the [facility] so I can refer him back to [facility] for training  MEDICATIONS: Continue Lantus 34 units daily  Continue HUmalog 10 units with each meal    EDUCATION / INSTRUCTIONS: BG monitoring instructions: Patient is instructed to check his blood sugars 3 times daily before meals times a day. Call [place] [facility] if: BG persistently < 70  I reviewed the Rule of 15 for the treatment of hypoglycemia in detail with the patient. Literature supplied.   2) Diabetic complications:  Eye: Does not have known diabetic retinopathy.  Neuro/ Feet: Does  have known diabetic peripheral neuropathy .  Renal: Patient does not have known baseline CKD. He   is  on an ACEI/ARB at present.    3) Dyslipidemia:   -His LDL continues to be above goal on most recent labs in 10/2020 at 113 mg/DL.  His TG is as  elevated as well.  -LDL goal<70 mg/DL -I am going to increase his atorvastatin as below   Medication Increase atorvastatin to 20 mg daily   F/U in 4 months   Signed  electronically by: Lyndle Herrlich, MD  [facility] Endocrinology  [facility] [address] [place] [place], [place] [address] Phone: [phone] FAX: [phone]   CC: Marisue Brooklyn [address] [place] [address] Phone: [phone]  Fax: [phone]  Return to Endocrinology clinic as below: Future Appointments  Date Time Provider Department Center  01/11/2021  1:00 PM Bonnita Levan, RD [facility]  02/06/2021 11:00 AM Saguier, Kateri Mc [facility]

## 2020-12-25 ENCOUNTER — Other Ambulatory Visit (HOSPITAL_COMMUNITY): Payer: Self-pay

## 2020-12-25 ENCOUNTER — Telehealth: Payer: Self-pay

## 2020-12-25 NOTE — Telephone Encounter (Signed)
Pharmacy received script.

## 2020-12-25 NOTE — Telephone Encounter (Signed)
Vm left for Health Dept to give a call back and verify that script was received

## 2020-12-26 MED ORDER — INSULIN PEN NEEDLE 31G X 5 MM MISC: 1.0000 | Freq: Four times a day (QID) | 2 refills | Status: DC

## 2020-12-26 NOTE — Telephone Encounter (Signed)
Pen needle script changed

## 2021-01-01 ENCOUNTER — Other Ambulatory Visit: Payer: Self-pay | Admitting: Medical

## 2021-01-11 ENCOUNTER — Other Ambulatory Visit: Payer: Self-pay

## 2021-01-11 ENCOUNTER — Encounter: Payer: Medicaid Other | Attending: Medical | Admitting: Dietician

## 2021-01-11 ENCOUNTER — Encounter: Payer: Self-pay | Admitting: Dietician

## 2021-01-11 DIAGNOSIS — Z794 Long term (current) use of insulin: Secondary | ICD-10-CM | POA: Insufficient documentation

## 2021-01-11 DIAGNOSIS — E1159 Type 2 diabetes mellitus with other circulatory complications: Secondary | ICD-10-CM | POA: Insufficient documentation

## 2021-01-11 NOTE — Progress Notes (Signed)
Diabetes Self-Management Education  Visit Type: Follow-up  Appt. Start Time: 1305 Appt. End Time: 1345  01/11/2021  [name], identified by name and date of birth, is a 45 y.o. male with a diagnosis of Diabetes: Type 2.   ASSESSMENT Patient is here today with his Kela Millin from [facility].  He was last seen by this RD on 11/16/2020. He states that he is not eating that much but continues to gain weight.  He wants to know why and how to prevent this.  Weight increased 3 lbs in the past 6 weeks. He has new teeth implants and still requires liquids.  He checks his blood glucose once daily using a Relion meter.  Blood glucose was 192 fasting today (no dinner last night) and 125-150 usually. He has a prescription for a Dexcom G6 CGM.  Called his pharmacy.  This is covered.  He is to pick this up today and come for training tomorrow.  History includes Type 2 Diabetes (2008), HTN, dyslipidemia, PVD, PVD, depression, all teeth were removed in July Smokes, eats 2 meals per day. Labs:  11/06/2020:  Cholesterol 191, HDL 36, LDL 113, Triglycerides 219, A1c 8.6%, GFR 92   Medication includes Lantus 34 units each am (patient takes this dose when his blood sugar is high but splits it between morning and evening when his blood glucose is lower in the morning). He is not taking the Humalog yet as the Health Department has not yet approved this to obtain for free.   Patient lives alone.  He is from [place]. Patient states that he has not worked since 2020 due to health and injuries.  He states that not working has caused him depression.  He does feel alone as his friends had to leave as he cannot support them. He is not on disability. He does receive food stamps.  Weight 244 lb (110.7 kg). Body mass index is 37.1 kg/m.   Diabetes Self-Management Education - 01/11/21 1731       Visit Information   Visit Type Follow-up      Initial Visit   Diabetes Type Type 2    Are you  taking your medications as prescribed? Yes      Psychosocial Assessment   Self-care barriers English as a second language    Self-management support Doctor's office;CDE visits    Other persons present Patient;Interpreter    Patient Concerns Nutrition/Meal planning;Monitoring      Pre-Education Assessment   Patient understands the diabetes disease and treatment process. Demonstrates understanding / competency    Patient understands incorporating nutritional management into lifestyle. Needs Review    Patient undertands incorporating physical activity into lifestyle. Demonstrates understanding / competency    Patient understands using medications safely. Demonstrates understanding / competency    Patient understands monitoring blood glucose, interpreting and using results Demonstrates understanding / competency    Patient understands prevention, detection, and treatment of acute complications. Demonstrates understanding / competency    Patient understands prevention, detection, and treatment of chronic complications. Demonstrates understanding / competency    Patient understands how to develop strategies to address psychosocial issues. Demonstrates understanding / competency    Patient understands how to develop strategies to promote health/change behavior. Needs Review      Complications   How often do you check your blood sugar? 1-2 times/day    Fasting Blood glucose range (mg/dL) 053-976;734-193      Dietary Intake   Breakfast 2 eggs, chai tea  Lunch zucchini soup or okra, oat bread, occasional rice    Beverage(s) water, 2% milk (1 cup), coffee with sweetened vanilla creamer, chai tea      Exercise   Exercise Type ADL's      Patient Education   Previous Diabetes Education Yes (please comment)   11/16/2020   Nutrition management  Meal options for control of blood glucose level and chronic complications.;Other (comment)   weight and metabolism   Physical activity and exercise  Role of  exercise on diabetes management, blood pressure control and cardiac health.    Medications Reviewed patients medication for diabetes, action, purpose, timing of dose and side effects.      Individualized Goals (developed by patient)   Nutrition General guidelines for healthy choices and portions discussed    Physical Activity Exercise 5-7 days per week;30 minutes per day    Medications take my medication as prescribed    Monitoring  test my blood glucose as discussed    Reducing Risk examine blood glucose patterns;increase portions of healthy fats      Patient Self-Evaluation of Goals - Patient rates self as meeting previously set goals (% of time)   Nutrition >75%    Physical Activity 25 - 50%    Medications >75%    Monitoring >75%    Problem Solving 50 - 75 %    Reducing Risk >75%    Health Coping >75%      Post-Education Assessment   Patient understands the diabetes disease and treatment process. Demonstrates understanding / competency    Patient understands incorporating nutritional management into lifestyle. Needs Review    Patient undertands incorporating physical activity into lifestyle. Demonstrates understanding / competency    Patient understands using medications safely. Demonstrates understanding / competency    Patient understands monitoring blood glucose, interpreting and using results Needs Review    Patient understands prevention, detection, and treatment of acute complications. Demonstrates understanding / competency    Patient understands prevention, detection, and treatment of chronic complications. Demonstrates understanding / competency    Patient understands how to develop strategies to address psychosocial issues. Demonstrates understanding / competency    Patient understands how to develop strategies to promote health/change behavior. Needs Review      Outcomes   Expected Outcomes Demonstrated interest in learning. Expect positive outcomes    Future DMSE 2  months    Program Status Not Completed      Subsequent Visit   Since your last visit have you experienced any weight changes? Gain    Weight Gain (lbs) 3    Since your last visit, are you checking your blood glucose at least once a day? Yes             Individualized Plan for Diabetes Self-Management Training:   Learning Objective:  Patient will have a greater understanding of diabetes self-management. Patient education plan is to attend individual and/or group sessions per assessed needs and concerns.   Plan:   Patient Instructions  Pick up your [facility] transmitter and sensors from your [facility] in [place].  Download the Dexcom G6 app on your phone. Download the [employer] on your phone.  Walk daily Reduce the amount of bread you eat.  Expected Outcomes:  Demonstrated interest in learning. Expect positive outcomes  Education material provided:   If problems or questions, patient to contact team via:  Phone  Future DSME appointment: 2 months

## 2021-01-11 NOTE — Patient Instructions (Addendum)
Pick up your [facility] transmitter and sensors from your [employer] in [place].  Download the Dexcom G6 app on your phone. Download the [employer] on your phone.  Walk daily Reduce the amount of bread you eat.

## 2021-01-12 ENCOUNTER — Ambulatory Visit: Payer: Medicaid Other | Admitting: Dietician

## 2021-01-15 ENCOUNTER — Encounter: Payer: Medicaid Other | Attending: Medical | Admitting: Dietician

## 2021-01-15 ENCOUNTER — Other Ambulatory Visit: Payer: Self-pay

## 2021-01-15 DIAGNOSIS — E1159 Type 2 diabetes mellitus with other circulatory complications: Secondary | ICD-10-CM | POA: Insufficient documentation

## 2021-01-15 DIAGNOSIS — Z794 Long term (current) use of insulin: Secondary | ICD-10-CM | POA: Insufficient documentation

## 2021-01-15 NOTE — Progress Notes (Signed)
Patient is here today with his Arabic interpretor Thomas [name] from [facility] for [employer] G6 training.  Dexcom G6 Personal CGM Training  Start time:0810    End time: 0910 Total time: 1 hour Thomas [name] was educated about the following:  -Getting to know device    (Phone programmed ) -Setting up device (high alert  250  , low alert 70  ) -Setting alert profile -Inserting sensor (  right abdomen     WNL) -Calibrating- none required for G6 -Ending sensor session -Trouble shooting -Reviewed insulin dosing from dexcom.  He installed the [employer] app on his cell phone.  He was added to the [facility] endocrinology system and entered all information to allow his readings to be shared.  Patient has [facility] tech support and my contact information. He is to follow up in 2 weeks.  Thomas [name], RD 01/15/2021 5:14 PM.

## 2021-01-29 ENCOUNTER — Telehealth: Payer: Self-pay | Admitting: Dietician

## 2021-01-29 NOTE — Telephone Encounter (Signed)
Patient is having problems with his Dexcom sensor as it did not sync.  Patient's friend interpreted. Patient to bring his supplies and we will start a new sensor and problem solve at his visit tomorrow morning.  Oran Rein, RD, LDN, CDCES

## 2021-01-30 ENCOUNTER — Other Ambulatory Visit: Payer: Self-pay

## 2021-01-30 ENCOUNTER — Encounter: Payer: Self-pay | Admitting: Dietician

## 2021-01-30 ENCOUNTER — Encounter: Payer: Medicaid Other | Admitting: Dietician

## 2021-01-30 DIAGNOSIS — E1159 Type 2 diabetes mellitus with other circulatory complications: Secondary | ICD-10-CM

## 2021-01-30 DIAGNOSIS — Z794 Long term (current) use of insulin: Secondary | ICD-10-CM

## 2021-01-30 NOTE — Patient Instructions (Signed)
Great job with changes made! Continue to stay active.   Glucose Goals: 80-130 first thing in the morning Less than 180 two hours after you eat.

## 2021-01-30 NOTE — Progress Notes (Signed)
Diabetes Self-Management Education  Visit Type: Follow-up  Appt. Start Time: 0810 Appt. End Time: 0910  01/30/2021  [name], identified by name and date of birth, is a 45 y.o. male with a diagnosis of Diabetes: Type 2.   ASSESSMENT  Patient is here today with his interpretor Hanan.  Patient speaks Aerobic.  Patient had an error on his Dexcom after he started this on October 13.   Dexcom tech support was called and they are sending a new sensor. Current sensor was ended and replaced.  Patient was able to do this without problem.  It is currently warming up.  Dexcom report reviewed.   85% in Range with an average glucose of 127. 3% low and 2% very low.   He reports frequently not eating dinner.  Glucose is low when he wakes occasionally.  He treats with something sweet. He denies missing insulin doses.  He is walking 30 minutes daily at [facility].  States that his legs hurt (due to previous surgery) when he walks more or faster. He remains concerned that he is gaining weight. He reports that his appetite is now increasing.  He has new dentures and requires soft foods.  History includes Type 2 Diabetes (2008), HTN, dyslipidemia, PVD, PVD, depression, all teeth were removed in July Smokes, eats 2 meals per day. Labs:  11/06/2020:  Cholesterol 191, HDL 36, LDL 113, Triglycerides 219, A1c 8.6%, GFR 92 Dexcom G6  Medication includes Lantus 34 units each am, Humalog 10 units before each meal. Patient lives alone.  He is from [place]. Patient states that he has not worked since 2020 due to health and injuries.  He states that not working has caused him depression.  He does feel alone as his friends had to leave as he cannot support them. He is not on disability. He does receive food stamps.  Weight 245 lb (111.1 kg). Body mass index is 37.25 kg/m.   Diabetes Self-Management Education - 01/30/21 0900       Visit Information   Visit Type Follow-up      Initial Visit    Diabetes Type Type 2    Are you taking your medications as prescribed? Yes      Psychosocial Assessment   Self-care barriers English as a second language    Self-management support Doctor's office;Friends;CDE visits    Other persons present Interpreter;Patient    Patient Concerns Weight Control;Monitoring    Special Needs Other (comment)   interpretor   Preferred Learning Style Auditory;Visual    Learning Readiness Ready    How often do you need to have someone help you when you read instructions, pamphlets, or other written materials from your doctor or pharmacy? 5 - Always      Pre-Education Assessment   Patient understands the diabetes disease and treatment process. Demonstrates understanding / competency    Patient understands incorporating nutritional management into lifestyle. Demonstrates understanding / competency    Patient undertands incorporating physical activity into lifestyle. Demonstrates understanding / competency    Patient understands using medications safely. Demonstrates understanding / competency    Patient understands monitoring blood glucose, interpreting and using results Needs Review    Patient understands prevention, detection, and treatment of acute complications. Demonstrates understanding / competency    Patient understands prevention, detection, and treatment of chronic complications. Demonstrates understanding / competency    Patient understands how to develop strategies to address psychosocial issues. Demonstrates understanding / competency    Patient understands how to develop  strategies to promote health/change behavior. Needs Review      Complications   How often do you check your blood sugar? > 4 times/day    Fasting Blood glucose range (mg/dL) <75;64-332;951-884    Postprandial Blood glucose range (mg/dL) 166-063;01-601    Number of hypoglycemic episodes per month 3    Can you tell when your blood sugar is low? Yes    What do you do if your blood  sugar is low? eats sweet    Number of hyperglycemic episodes per week 2    Can you tell when your blood sugar is high? No      Dietary Intake   Breakfast 2 eggs, cheese, 1 [employer] toast, occasional banana    Lunch grilled fish, broccoli, occasional bread    Dinner skips most days unless eating with friends    Beverage(s) water, 2% milk, coffee with sweet creamer, chai tea      Exercise   Exercise Type Light (walking / raking leaves)    How many days per week to you exercise? 5    How many minutes per day do you exercise? 30    Total minutes per week of exercise 150      Patient Education   Previous Diabetes Education Yes (please comment)   9/22   Physical activity and exercise  Role of exercise on diabetes management, blood pressure control and cardiac health.    Medications Reviewed patients medication for diabetes, action, purpose, timing of dose and side effects.    Monitoring Taught/evaluated SMBG meter.      Individualized Goals (developed by patient)   Physical Activity Exercise 5-7 days per week;30 minutes per day    Medications take my medication as prescribed    Monitoring  test my blood glucose as discussed    Reducing Risk examine blood glucose patterns    Health Coping discuss diabetes with (comment)   MD, RD, CDCES     Patient Self-Evaluation of Goals - Patient rates self as meeting previously set goals (% of time)   Nutrition >75%    Physical Activity >75%    Medications >75%    Monitoring >75%    Problem Solving >75%    Reducing Risk >75%    Health Coping >75%      Post-Education Assessment   Patient understands the diabetes disease and treatment process. Demonstrates understanding / competency    Patient understands incorporating nutritional management into lifestyle. Needs Review    Patient undertands incorporating physical activity into lifestyle. Demonstrates understanding / competency    Patient understands using medications safely. Demonstrates understanding  / competency    Patient understands monitoring blood glucose, interpreting and using results Demonstrates understanding / competency    Patient understands prevention, detection, and treatment of acute complications. Demonstrates understanding / competency    Patient understands prevention, detection, and treatment of chronic complications. Demonstrates understanding / competency    Patient understands how to develop strategies to address psychosocial issues. Demonstrates understanding / competency    Patient understands how to develop strategies to promote health/change behavior. Needs Review      Outcomes   Expected Outcomes Demonstrated interest in learning. Expect positive outcomes    Future DMSE 3-4 months    Program Status Not Completed      Subsequent Visit   Since your last visit have you experienced any weight changes? Gain    Weight Gain (lbs) 1    Since your last visit, are you checking your blood  glucose at least once a day? Yes             Individualized Plan for Diabetes Self-Management Training:   Learning Objective:  Patient will have a greater understanding of diabetes self-management. Patient education plan is to attend individual and/or group sessions per assessed needs and concerns.   Plan:   Patient Instructions  [employer] job with changes made! Continue to stay active.   Glucose Goals: 80-130 first thing in the morning Less than 180 two hours after you eat.  Expected Outcomes:  Demonstrated interest in learning. Expect positive outcomes  Education material provided:   If problems or questions, patient to contact team via:  Phone  Future DSME appointment: 3-4 months

## 2021-02-06 ENCOUNTER — Ambulatory Visit: Payer: Medicaid Other | Admitting: Medical

## 2021-02-06 ENCOUNTER — Telehealth: Payer: Self-pay | Admitting: Medical

## 2021-02-06 ENCOUNTER — Telehealth: Payer: Self-pay | Admitting: Dietician

## 2021-02-06 ENCOUNTER — Other Ambulatory Visit: Payer: Self-pay

## 2021-02-06 VITALS — BP 140/70 | HR 100 | Resp 18 | Ht 68.0 in | Wt 243.0 lb

## 2021-02-06 DIAGNOSIS — E1065 Type 1 diabetes mellitus with hyperglycemia: Secondary | ICD-10-CM | POA: Diagnosis not present

## 2021-02-06 DIAGNOSIS — K219 Gastro-esophageal reflux disease without esophagitis: Secondary | ICD-10-CM

## 2021-02-06 DIAGNOSIS — I739 Peripheral vascular disease, unspecified: Secondary | ICD-10-CM

## 2021-02-06 DIAGNOSIS — E785 Hyperlipidemia, unspecified: Secondary | ICD-10-CM

## 2021-02-06 DIAGNOSIS — R635 Abnormal weight gain: Secondary | ICD-10-CM

## 2021-02-06 DIAGNOSIS — I10 Essential (primary) hypertension: Secondary | ICD-10-CM | POA: Diagnosis not present

## 2021-02-06 NOTE — Progress Notes (Signed)
Subjective:    Patient ID: Thomas [name], male    DOB: 1975-08-03, 45 y.o.   MRN: [id number]  HPI  Pt in for follow up.  Exam done with interpretor.   Pt had continous glucose monitor. Pt sees Dr. [name]. Pt machine is not working. Pt got replacement on 01/30/2021. Pt states this am his machine    Pt expresses some frustration second machine from same company. It is dexcom.  Pt last a1c was 8.6. yesterday sugar was 88 and 150) . I her medical assitant here. Pt of Dr. [name] cgm. Pt states he needs code to start machine app..  Pt does not want to see the diabetic educator. He was instructed to fix the machine problem that this is appropiate step per Dr. [name] who I talked with today.  Pt is gaining weight since using the insulin.    [phone] Pt phone number.  Review of Systems  Constitutional:  Negative for chills, diaphoresis, fatigue and fever.  Respiratory:  Negative for cough, chest tightness, shortness of breath and wheezing.   Cardiovascular:  Negative for chest pain and palpitations.  Gastrointestinal:  Negative for abdominal pain and nausea.  Musculoskeletal:  Negative for back pain, gait problem and joint swelling.  Skin:  Negative for rash.  Neurological:  Negative for dizziness, seizures, light-headedness and headaches.  Hematological:  Negative for adenopathy. Does not bruise/bleed easily.  Psychiatric/Behavioral:  Negative for behavioral problems, confusion and sleep disturbance. The patient is not nervous/anxious.     Past Medical History:  Diagnosis Date   Depression 01/27/2015   Diabetes mellitus without complication (HCC)    Hyperlipidemia    Hypertension    Peripheral vascular disease (HCC)      Social History   Socioeconomic History   Marital status: Single    Spouse name: Not on file   Number of children: Not on file   Years of education: Not on file   Highest education level: Not on file  Occupational History   Occupation:  Unemployed  Tobacco Use   Smoking status: Every Day    Packs/day: 1.00    Types: Cigarettes   Smokeless tobacco: Former   Tobacco comments:    5 cigarrettes a day  Vaping Use   Vaping Use: Never used  Substance and Sexual Activity   Alcohol use: No    Alcohol/week: 0.0 standard drinks   Drug use: No   Sexual activity: Not on file  Other Topics Concern   Not on file  Social History Narrative   Not on file   Social Determinants of Health   Financial Resource Strain: Not on file  Food Insecurity: Not on file  Transportation Needs: Not on file  Physical Activity: Not on file  Stress: Not on file  Social Connections: Not on file  Intimate Partner Violence: Not on file    Past Surgical History:  Procedure Laterality Date   ABDOMINAL AORTOGRAM W/LOWER EXTREMITY Right 04/19/2019   Procedure: ABDOMINAL AORTOGRAM W/LOWER EXTREMITY;  Surgeon: [name], MD;  Location: [facility];  Service: Cardiovascular;  Laterality: Right;   ABDOMINAL AORTOGRAM W/LOWER EXTREMITY Right 09/16/2019   Procedure: ABDOMINAL AORTOGRAM W/LOWER EXTREMITY;  Surgeon: [name], MD;  Location: [facility];  Service: Cardiovascular;  Laterality: Right;   ABDOMINAL AORTOGRAM W/LOWER EXTREMITY N/A 08/17/2020   Procedure: ABDOMINAL AORTOGRAM W/LOWER EXTREMITY;  Surgeon: [name], MD;  Location: [facility];  Service: Cardiovascular;  Laterality: N/A;   FEMORAL-TIBIAL  BYPASS GRAFT Right 04/22/2019   Procedure: Right  FEMORAL-TIBIAL ARTERY BYPASS GRAFT with Harvest of Saphenous vein RIGHT SUPERFICIAL FEMORAL ARTERY TO TIBIOPERONEAL TRUNK BYPASS;  Surgeon: [name], MD;  Location: [facility] OR;  Service: Vascular;  Laterality: Right;   IR CERVICAL/THORACIC DISC ASPIRATION W/IMAG GUIDE  04/15/2019   IR FLUORO GUIDED NEEDLE PLC ASPIRATION/INJECTION LOC  04/15/2019   kidney stones  2009   PERIPHERAL VASCULAR BALLOON ANGIOPLASTY Right 09/16/2019   Procedure: PERIPHERAL  VASCULAR BALLOON ANGIOPLASTY;  Surgeon: [name], MD;  Location: [facility];  Service: Cardiovascular;  Laterality: Right;  ext iliac DCB   PERIPHERAL VASCULAR BALLOON ANGIOPLASTY  08/17/2020   Procedure: PERIPHERAL VASCULAR BALLOON ANGIOPLASTY;  Surgeon: [name], MD;  Location: [facility];  Service: Cardiovascular;;  Right TP trunk   PERIPHERAL VASCULAR BALLOON ANGIOPLASTY Left 08/24/2020   Procedure: PERIPHERAL VASCULAR BALLOON ANGIOPLASTY;  Surgeon: [name], MD;  Location: [facility];  Service: Cardiovascular;  Laterality: Left;  Tibioperoneal trunk and superficial femoral arteries.   PERIPHERAL VASCULAR INTERVENTION  04/19/2019   Procedure: PERIPHERAL VASCULAR INTERVENTION;  Surgeon: [name], MD;  Location: [facility] INVASIVE CV LAB;  Service: Cardiovascular;;  right external iliac   TEE WITHOUT CARDIOVERSION N/A 04/19/2019   Procedure: TRANSESOPHAGEAL ECHOCARDIOGRAM (TEE);  Surgeon: [name], MD;  Location: [facility] ENDOSCOPY;  Service: Endoscopy;  Laterality: N/A;    Family History  Problem Relation Age of Onset   Colon cancer Neg Hx    Esophageal cancer Neg Hx    Pancreatic cancer Neg Hx    Liver cancer Neg Hx    Stomach cancer Neg Hx     No Known Allergies  Current Outpatient Medications on File Prior to Visit  Medication Sig Dispense Refill   acetaminophen (TYLENOL) 500 MG tablet Take 500 mg by mouth every 6 (six) hours as needed.     aspirin EC 81 MG tablet Take 1 tablet (81 mg total) by mouth daily. Swallow whole. 365 tablet 0   atorvastatin (LIPITOR) 20 MG tablet Take 1 tablet (20 mg total) by mouth daily. 90 tablet 3   clopidogrel (PLAVIX) 75 MG tablet TAKE 1 TABLET BY MOUTH DAILY WITH BREAKFAST 90 tablet 2   Continuous Blood Gluc Sensor (DEXCOM G6 SENSOR) MISC 1 Device by Does not apply route as directed. 9 each 3   Continuous Blood Gluc Transmit (DEXCOM G6 TRANSMITTER) MISC 1 Device by Does not apply  route as directed. 1 each 3   Heating Pads PADS Use over the counter heating pads as needed for back pain 1 Units 5   insulin lispro (HUMALOG KWIKPEN) 100 UNIT/ML KwikPen Inject 10 Units into the skin 3 (three) times daily. 30 mL 3   Insulin Pen Needle 29G X MISC 1 Device by Does not apply route in the morning, at noon, in the evening, and at bedtime. 400 each 3   Insulin Pen Needle 31G X 5 MM MISC 1 Device by Does not apply route 4 (four) times daily. 100 each 2   LANTUS SOLOSTAR 100 UNIT/ML Solostar Pen INJECT 20 TO 34 UNITS SUBCUTANEOUSLY ONCE DAILY 12 mL 0   lisinopril (ZESTRIL) 20 MG tablet Take 1 tablet (20 mg total) by mouth daily. 90 tablet 3   Omega-3 Fatty Acids (OMEGA-3 EPA FISH OIL PO) Take 2,000 Units by mouth daily.     omeprazole (PRILOSEC) 20 MG capsule Take 1 capsule (20 mg total) by mouth daily. 30 capsule  11   OVER THE COUNTER MEDICATION [employer] Ultra Sugar aid  Beet gummies  Arginine and Citrulline     No current facility-administered medications on file prior to visit.    BP 140/70   Pulse 100   Resp 18   Ht 5\' 8"  (1.727 m)   Wt 243 lb (110.2 kg)   SpO2 100%   BMI 36.95 kg/m       Objective:   Physical Exam   General Mental Status- Alert. General Appearance- Not in acute distress.   Skin General: Color- Normal Color. Moisture- Normal Moisture.  Neck Carotid Arteries- Normal color. Moisture- Normal Moisture. No carotid bruits. No JVD.  Chest and Lung Exam Auscultation: Breath Sounds:-Normal.  Cardiovascular Auscultation:Rythm- Regular. Murmurs & Other Heart Sounds:Auscultation of the heart reveals- No Murmurs.  Abdomen Inspection:-Inspeection Normal. Palpation/Percussion:Note:No mass. Palpation and Percussion of the abdomen reveal- Non Tender, Non Distended + BS, no rebound or guarding.   Neurologic Cranial Nerve exam:- CN III-XII intact(No nystagmus), symmetric smile. Strength:- 5/5 equal and symmetric strength both upper and  lower extremities.      Assessment & Plan:   Patient Instructions  Type I diabetic patient with use of CGM.  Unfortunately he describes this is a second machine stopped working.  Notified patient's endocrinologist who advised patient to contact diabetic educator.  I sent diabetic educator message and she might recheck the patient as well.  Continue current insulin prescribed by endocrinologist.  If you need any refills of medication or equipment related to diabetes reach out to [place] office.  Hypertension.  Blood pressure is 140/80 today.  Ideally would like it to be closer to 130/80.  Check blood pressures daily.  If you seen blood pressure is not reaching 130/80 notify me and would increase your lisinopril to 40 mg daily.  Hyperlipidemia.  Continue atorvastatin.  Placed future metabolic panel and lipid panel.  Hopefully that will be able to be done tomorrow or shortly thereafter.  GERD.  Continue omeprazole.  Weight gain weight is is likely insulin related.  Recommend eat healthy diet.  Try to get some regular exercise.  Discussed weight gain with your endocrinologist.  You did mention that she had mentioned may be giving you a medication for weight loss.  Possibly Ozempic?  Follow-up with me in 3 months and as regular scheduled with Dr. [place]ford.   [name], PA-C   Time spent with patient today was 42  minutes which consisted of chart review, discussing diagnosis, work up, treatment , attempting to resolve CGM issue and documentation.

## 2021-02-06 NOTE — Patient Instructions (Addendum)
Type I diabetic patient with use of CGM.  Unfortunately he describes this is a second machine stopped working.  Notified patient's endocrinologist who advised patient to contact diabetic educator.  I sent diabetic educator message and she might recheck the patient as well.  Continue current insulin prescribed by endocrinologist.  If you need any refills of medication or equipment related to diabetes reach out to [place] office.  Hypertension.  Blood pressure is 140/80 today.  Ideally would like it to be closer to 130/80.  Check blood pressures daily.  If you seen blood pressure is not reaching 130/80 notify me and would increase your lisinopril to 40 mg daily.  Hyperlipidemia.  Continue atorvastatin.  Placed future metabolic panel and lipid panel.  Hopefully that will be able to be done tomorrow or shortly thereafter.  GERD.  Continue omeprazole.  Weight gain weight is is likely insulin related.  Recommend eat healthy diet.  Try to get some regular exercise.  Discussed weight gain with your endocrinologist.  You did mention that she had mentioned may be giving you a medication for weight loss.  Possibly Ozempic?  Follow-up with me in 3 months and as regular scheduled with Dr. [name].

## 2021-02-06 NOTE — Telephone Encounter (Signed)
Called patient re:  messaged by MD that patient's Dexcom is not functioning. Called patient's friend who speak English but he was not available. Called patient with Charyl Dancer [phone] from [employer] (Arabic). Patient was not available.   Message left.  Will reach out to patient later in the week.  Oran Rein, RD, LDN, CDCES

## 2021-02-06 NOTE — Telephone Encounter (Signed)
Chart opened to rx med, order lab, review chart, respond to my chart message or send message to staff member  

## 2021-02-06 NOTE — Telephone Encounter (Signed)
Thomas [name],   Pt CGM recently stopped working this morning. Pt states this is second machine malfunctioning. I advised him to call you but he is reluctant. At point he is expressing just want to start checking sugar by fsbs again.  Pt phone number [phone]   Thanks,  [name], PA-C

## 2021-02-07 ENCOUNTER — Other Ambulatory Visit (INDEPENDENT_AMBULATORY_CARE_PROVIDER_SITE_OTHER): Payer: Medicaid Other

## 2021-02-07 DIAGNOSIS — I10 Essential (primary) hypertension: Secondary | ICD-10-CM

## 2021-02-07 DIAGNOSIS — E785 Hyperlipidemia, unspecified: Secondary | ICD-10-CM | POA: Diagnosis not present

## 2021-02-07 LAB — LDL CHOLESTEROL, DIRECT: Direct LDL: 104 mg/dL

## 2021-02-07 LAB — COMPREHENSIVE METABOLIC PANEL
ALT: 13 U/L (ref 0–53)
AST: 13 U/L (ref 0–37)
Albumin: 3.9 g/dL (ref 3.5–5.2)
Alkaline Phosphatase: 89 U/L (ref 39–117)
BUN: 24 mg/dL — ABNORMAL HIGH (ref 6–23)
CO2: 25 mEq/L (ref 19–32)
Calcium: 8.8 mg/dL (ref 8.4–10.5)
Chloride: 107 mEq/L (ref 96–112)
Creatinine, Ser: 1.24 mg/dL (ref 0.40–1.50)
GFR: 70.83 mL/min (ref >60.00)
Glucose, Bld: 275 mg/dL — ABNORMAL HIGH (ref 70–99)
Potassium: 4.6 mEq/L (ref 3.5–5.1)
Sodium: 138 mEq/L (ref 135–145)
Total Bilirubin: 0.3 mg/dL (ref 0.2–1.2)
Total Protein: 6.6 g/dL (ref 6.0–8.3)

## 2021-02-07 LAB — LIPID PANEL
Cholesterol: 168 mg/dL (ref 0–200)
HDL: 30.2 mg/dL — ABNORMAL LOW (ref >39.00)
NonHDL: 137.94
Total CHOL/HDL Ratio: 6
Triglycerides: 222 mg/dL — ABNORMAL HIGH (ref 0.0–149.0)
VLDL: 44.4 mg/dL — ABNORMAL HIGH (ref 0.0–40.0)

## 2021-02-08 NOTE — Telephone Encounter (Signed)
[employer] [phone] with [place].  Called patient.  Patient was not available.  Message left by interpretor to call if he desires help with the [facility] [phone].  Called his friend who speaks English Programmer, systems).  Also, gave him my number for A[place] to call if questions.  Sensor ended after 7 days.  Discussed that Majeed could call Dexcom to problem solve and possibly get another Sensor.  Discussed that Marcella may need to shave the application site on his stomach to help this stick better and potentially prevent the sensor from becoming dislodged.    Oran Rein, RD, LDN, CDCES

## 2021-02-19 ENCOUNTER — Other Ambulatory Visit (HOSPITAL_COMMUNITY): Payer: Self-pay

## 2021-02-19 ENCOUNTER — Telehealth: Payer: Self-pay | Admitting: Pharmacy Technician

## 2021-02-19 NOTE — Telephone Encounter (Signed)
Patient Advocate Encounter   Received notification from CoverMyMeds that prior authorization for [employer] is required by his/her insurance [employer].  Per Test Claim: no PA needed at this time. Pt got a transmitter the end of September, so it's not due to be replaced yet.

## 2021-03-12 ENCOUNTER — Telehealth: Payer: Self-pay | Admitting: Pharmacy Technician

## 2021-03-12 NOTE — Telephone Encounter (Signed)
Patient Advocate Encounter  Received notification from [employer] that prior authorization for DEXCOM G6 TRANSMITTER is required.   PA submitted on 11.28.22 Key [id number] Status is pending   [facility] will continue to follow  Ricke Hey, CPhT Patient Advocate [facility] Phone: [phone] Fax:  [phone]

## 2021-03-13 NOTE — Telephone Encounter (Signed)
Received a fax regarding Prior Authorization from [employer] for DEXCOM G6 TRANSMITTER. Authorization has been DENIED because AUTHORIZATION WAS APPROVED ORIGINALLY THROUGH 12.4.22.  WILL NEED TO TRY AGAIN AFTER 12.4.22

## 2021-04-03 ENCOUNTER — Ambulatory Visit (HOSPITAL_COMMUNITY)
Admission: RE | Admit: 2021-04-03 | Discharge: 2021-04-03 | Disposition: A | Discharge status: Home / Self Care | Payer: Medicaid Other | Source: Ambulatory Visit | Attending: Physician Assistant | Admitting: Physician Assistant

## 2021-04-03 ENCOUNTER — Other Ambulatory Visit: Payer: Self-pay

## 2021-04-03 ENCOUNTER — Ambulatory Visit (INDEPENDENT_AMBULATORY_CARE_PROVIDER_SITE_OTHER)
Admission: RE | Admit: 2021-04-03 | Discharge: 2021-04-03 | Disposition: A | Discharge status: Home / Self Care | Payer: Medicaid Other | Source: Ambulatory Visit | Attending: Physician Assistant | Admitting: Physician Assistant

## 2021-04-03 ENCOUNTER — Ambulatory Visit: Payer: Medicaid Other | Admitting: Physician Assistant

## 2021-04-03 VITALS — BP 171/98 | HR 95 | Temp 98.2°F | Resp 20 | Ht 68.0 in

## 2021-04-03 DIAGNOSIS — I739 Peripheral vascular disease, unspecified: Secondary | ICD-10-CM

## 2021-04-03 NOTE — Progress Notes (Signed)
[facility]  HISTORY AND PHYSICAL   History of Present Illness:  Patient is a 45 y.o. year old male who presents for evaluation of PAD with history of right foot toe wounds that are now healed.  S/P status post right PTA and TP trunk angioplasty by Dr. Chestine Spore on 08/17/2020.  He then underwent left SFA angioplasty as well as below the knee popliteal, TP trunk, and proximal PTA angioplasty by Dr. Chestine Spore on 08/24/2020.  Surgical history also significant for right external iliac artery stent as well as right femoral to popliteal bypass which are both widely patent based on arteriogram.  The staged percutaneous interventions were performed due to bilateral lower extremity lifestyle limiting claudication.  Patient states he still has claudication symptoms B LE after ambulating 30 min.  This is an improvement from his initial evaluation.  He states he walks daily.  He denise new wounds or rest pain.   He is taking aspirin, Plavix, statin daily.  We communicated with the translator on the IPAD.   Past Medical History:  Diagnosis Date   Depression 01/27/2015   Diabetes mellitus without complication (HCC)    Hyperlipidemia    Hypertension    Peripheral vascular disease [facility])     Past Surgical History:  Procedure Laterality Date   ABDOMINAL AORTOGRAM W/LOWER EXTREMITY Right 04/19/2019   Procedure: ABDOMINAL AORTOGRAM W/LOWER EXTREMITY;  Surgeon: [name], MD;  Location: [facility];  Service: Cardiovascular;  Laterality: Right;   ABDOMINAL AORTOGRAM W/LOWER EXTREMITY Right 09/16/2019   Procedure: ABDOMINAL AORTOGRAM W/LOWER EXTREMITY;  Surgeon: [name], MD;  Location: [facility];  Service: Cardiovascular;  Laterality: Right;   ABDOMINAL AORTOGRAM W/LOWER EXTREMITY N/A 08/17/2020   Procedure: ABDOMINAL AORTOGRAM W/LOWER EXTREMITY;  Surgeon: [name], MD;  Location: [facility];  Service: Cardiovascular;  Laterality: N/A;    FEMORAL-TIBIAL BYPASS GRAFT Right 04/22/2019   Procedure: Right  FEMORAL-TIBIAL ARTERY BYPASS GRAFT with Harvest of Saphenous vein RIGHT SUPERFICIAL FEMORAL ARTERY TO TIBIOPERONEAL TRUNK BYPASS;  Surgeon: [name], MD;  Location: [facility] OR;  Service: Vascular;  Laterality: Right;   IR CERVICAL/THORACIC DISC ASPIRATION W/IMAG GUIDE  04/15/2019   IR FLUORO GUIDED NEEDLE PLC ASPIRATION/INJECTION LOC  04/15/2019   kidney stones  2009   PERIPHERAL VASCULAR BALLOON ANGIOPLASTY Right 09/16/2019   Procedure: PERIPHERAL VASCULAR BALLOON ANGIOPLASTY;  Surgeon: [name], MD;  Location: [facility];  Service: Cardiovascular;  Laterality: Right;  ext iliac DCB   PERIPHERAL VASCULAR BALLOON ANGIOPLASTY  08/17/2020   Procedure: PERIPHERAL VASCULAR BALLOON ANGIOPLASTY;  Surgeon: [name], MD;  Location: [facility];  Service: Cardiovascular;;  Right TP trunk   PERIPHERAL VASCULAR BALLOON ANGIOPLASTY Left 08/24/2020   Procedure: PERIPHERAL VASCULAR BALLOON ANGIOPLASTY;  Surgeon: [name], MD;  Location: [facility];  Service: Cardiovascular;  Laterality: Left;  Tibioperoneal trunk and superficial femoral arteries.   PERIPHERAL VASCULAR INTERVENTION  04/19/2019   Procedure: PERIPHERAL VASCULAR INTERVENTION;  Surgeon: [name], MD;  Location: [facility] INVASIVE CV LAB;  Service: Cardiovascular;;  right external iliac   TEE WITHOUT CARDIOVERSION N/A 04/19/2019   Procedure: TRANSESOPHAGEAL ECHOCARDIOGRAM (TEE);  Surgeon: [name], MD;  Location: [facility] ENDOSCOPY;  Service: Endoscopy;  Laterality: N/A;    ROS:   General:  No weight loss, Fever, chills  HEENT: No recent headaches, no nasal bleeding, no visual changes, no sore throat  Neurologic: No dizziness, blackouts, seizures.  No recent symptoms of stroke or mini- stroke. No recent episodes of slurred speech, or temporary blindness.  Cardiac: No recent episodes of chest pain/pressure, no  shortness of breath at rest.  No shortness of breath with exertion.  Denies history of atrial fibrillation or irregular heartbeat  Vascular: No history of rest pain in feet.  No history of claudication.  No history of non-healing ulcer, No history of DVT   Pulmonary: No home oxygen, no productive cough, no hemoptysis,  No asthma or wheezing  Musculoskeletal:   Arthritis,  Low back pain,   Joint pain  Hematologic:No history of hypercoagulable state.  No history of easy bleeding.  No history of anemia  Gastrointestinal: No hematochezia or melena,  No gastroesophageal reflux, no trouble swallowing  Urinary:  chronic Kidney disease,  on HD -  MWF or  TTHS,  Burning with urination,  Frequent urination,  Difficulty urinating;   Skin: No rashes  Psychological: No history of anxiety,  No history of depression  Social History Social History   Tobacco Use   Smoking status: Every Day    Packs/day: 1.00    Types: Cigarettes   Smokeless tobacco: Former   Tobacco comments:    5 cigarrettes a day  Vaping Use   Vaping Use: Never used  Substance Use Topics   Alcohol use: No    Alcohol/week: 0.0 standard drinks   Drug use: No    Family History Family History  Problem Relation Age of Onset   Colon cancer Neg Hx    Esophageal cancer Neg Hx    Pancreatic cancer Neg Hx    Liver cancer Neg Hx    Stomach cancer Neg Hx     Allergies  No Known Allergies   Current Outpatient Medications  Medication Sig Dispense Refill   aspirin EC 81 MG tablet Take 1 tablet (81 mg total) by mouth daily. Swallow whole. 365 tablet 0   atorvastatin (LIPITOR) 20 MG tablet Take 1 tablet (20 mg total) by mouth daily. 90 tablet 3   clopidogrel (PLAVIX) 75 MG tablet TAKE 1 TABLET BY MOUTH DAILY WITH BREAKFAST 90 tablet 2   Continuous Blood Gluc Sensor (DEXCOM G6 SENSOR) MISC 1 Device by Does not apply route as directed. 9 each 3   Continuous Blood Gluc Transmit (DEXCOM G6  TRANSMITTER) MISC 1 Device by Does not apply route as directed. 1 each 3   insulin lispro (HUMALOG KWIKPEN) 100 UNIT/ML KwikPen Inject 10 Units into the skin 3 (three) times daily. 30 mL 3   LANTUS SOLOSTAR 100 UNIT/ML Solostar Pen INJECT 20 TO 34 UNITS SUBCUTANEOUSLY ONCE DAILY 12 mL 0   lisinopril (ZESTRIL) 20 MG tablet Take 1 tablet (20 mg total) by mouth daily. 90 tablet 3   Omega-3 Fatty Acids (OMEGA-3 EPA FISH OIL PO) Take 2,000 Units by mouth daily.     acetaminophen (TYLENOL) 500 MG tablet Take 500 mg by mouth every 6 (six) hours as needed. (Patient not taking: Reported on 04/03/2021)     Heating Pads PADS Use over the counter heating pads as needed for back pain (Patient not taking: Reported on 04/03/2021) 1 Units 5   Insulin Pen Needle 29G X MISC 1 Device by Does not apply route in the morning, at noon, in the evening, and at bedtime. (Patient not taking: Reported on 04/03/2021) 400 each 3   Insulin Pen Needle 31G X 5 MM MISC 1 Device by  Does not apply route 4 (four) times daily. (Patient not taking: Reported on 04/03/2021) 100 each 2   omeprazole (PRILOSEC) 20 MG capsule Take 1 capsule (20 mg total) by mouth daily. (Patient not taking: Reported on 04/03/2021) 30 capsule 11   OVER THE COUNTER MEDICATION [employer] Ultra Sugar aid  Beet gummies  Arginine and Citrulline (Patient not taking: Reported on 04/03/2021)     No current facility-administered medications for this visit.    Physical Examination  Vitals:   04/03/21 1551  BP: (!) 171/98  Pulse: 95  Resp: 20  Temp: 98.2 F (36.8 C)  SpO2: 98%  Height: 5\' 8"  (1.727 m)    Body mass index is 36.95 kg/m.  General:  Alert and oriented, no acute distress HEENT: Normal Neck: No bruit or JVD Pulmonary: Clear to auscultation bilaterally Cardiac: Regular Rate and Rhythm without murmur Abdomen: Soft, non-tender, non-distended, no mass, no scars Skin: No rash Extremities: without ischemic changes, without Gangrene ,  without cellulitis; without open wounds; no firm hematoma or palpable pseudoaneurysm bilateral groin access sites Musculoskeletal: No deformity or edema  Neurologic: Upper and lower extremity motor 5/5 and symmetric  DATA:    +-----------+--------+-----+--------+----------+--------+   RIGHT       PSV cm/s Ratio Stenosis Waveform   Comments   +-----------+--------+-----+--------+----------+--------+   CFA Prox    129                     triphasic             +-----------+--------+-----+--------+----------+--------+   SFA Prox    100                     triphasic             +-----------+--------+-----+--------+----------+--------+   SFA Mid     68                      triphasic             +-----------+--------+-----+--------+----------+--------+   SFA Distal  70                                 bypass     +-----------+--------+-----+--------+----------+--------+   ATA Distal  7                       monophasic dampened   +-----------+--------+-----+--------+----------+--------+   PTA Distal  22                      monophasic dampened   +-----------+--------+-----+--------+----------+--------+   PERO Distal 19                      monophasic dampened   +-----------+--------+-----+--------+----------+--------+        Right Graft #1: Above SFA to tibioperoneal trunk bypass with saphenous  vein  +------------------+--------+--------+----------+--------+                      PSV cm/s Stenosis Waveform   Comments   +------------------+--------+--------+----------+--------+   Inflow             56                biphasic              +------------------+--------+--------+----------+--------+   Prox Anastomosis   46  biphasic              +------------------+--------+--------+----------+--------+   Proximal Graft     76                triphasic             +------------------+--------+--------+----------+--------+   Mid Graft          39                biphasic               +------------------+--------+--------+----------+--------+   Distal Graft       38                monophasic brisk      +------------------+--------+--------+----------+--------+   Distal Anastomosis 45                biphasic              +------------------+--------+--------+----------+--------+   Outflow                                                    +------------------+--------+--------+----------+--------+          +-----------+--------+-----+---------------+----------+--------+   LEFT        PSV cm/s Ratio Stenosis        Waveform   Comments   +-----------+--------+-----+---------------+----------+--------+   CFA Prox    170                            triphasic             +-----------+--------+-----+---------------+----------+--------+   CFA Distal  117                            triphasic             +-----------+--------+-----+---------------+----------+--------+   DFA         125                            biphasic              +-----------+--------+-----+---------------+----------+--------+   SFA Prox    121                            triphasic             +-----------+--------+-----+---------------+----------+--------+   SFA Mid     305            50-74% stenosis triphasic             +-----------+--------+-----+---------------+----------+--------+   SFA Distal  104                            triphasic             +-----------+--------+-----+---------------+----------+--------+   POP Prox    47                             triphasic             +-----------+--------+-----+---------------+----------+--------+   POP Mid  45                             biphasic   dampened   +-----------+--------+-----+---------------+----------+--------+   POP Distal  62                             triphasic             +-----------+--------+-----+---------------+----------+--------+   ATA Distal  12                             monophasic dampened    +-----------+--------+-----+---------------+----------+--------+   PTA Distal  19                             monophasic dampened   +-----------+--------+-----+---------------+----------+--------+   PERO Distal                                           NV         +-----------+--------+-----+---------------+----------+--------+         Summary:  Right: The above knee to tibioperoneal bypass appears patent with no  visualized stenosis.   Left: 50-74% stenosis noted in the superficial femoral artery.      ABI Findings:  +---------+------------------+-----+----------+--------+   Right     Rt Pressure (mmHg) Index Waveform   Comment    +---------+------------------+-----+----------+--------+   Brachial  152                                            +---------+------------------+-----+----------+--------+   PTA       122                0.74  monophasic brisk      +---------+------------------+-----+----------+--------+   DP        108                0.66  biphasic   dampened   +---------+------------------+-----+----------+--------+   Great Toe 86                 0.52  Abnormal              +---------+------------------+-----+----------+--------+   +---------+------------------+-----+----------+-------+   Left      Lt Pressure (mmHg) Index Waveform   Comment   +---------+------------------+-----+----------+-------+   Brachial  164                                           +---------+------------------+-----+----------+-------+   PTA       109                0.66  biphasic             +---------+------------------+-----+----------+-------+   DP        108                0.66  monophasic           +---------+------------------+-----+----------+-------+   Great Toe 82  0.50  Abnormal             +---------+------------------+-----+----------+-------+   +-------+-----------+-----------+------------+------------+   ABI/TBI Today's ABI Today's TBI Previous  ABI Previous TBI   +-------+-----------+-----------+------------+------------+   Right   0.74        0.52        0.99         0.74           +-------+-----------+-----------+------------+------------+   Left    0.66        0.50        0.94         0.64           +-------+-----------+-----------+------------+------------+      Bilateral ABIs and TBIs appear decreased.     Summary:  Right: Resting right ankle-brachial index indicates moderate right lower  extremity arterial disease. The right toe-brachial index is abnormal.   Left: Resting left ankle-brachial index indicates moderate left lower  extremity arterial disease. The left toe-brachial index is abnormal.   ASSESSMENT/PLAN:  PAD  status post bilateral staged percutaneous revascularization He states he can ambulate longer than before intervention and his right toe wounds are healed. B ABI's show decreased index. Mid left SFA has 50-74% stenosis with triphasic flow, the tibials have monophasic flow.   I explained he may continue to improve his collateral flow by continued to walk and increase his tolerance over time.    He will f/u with repeat studies in 6 months.  If he has decreased distance claudication, rest pain or new wounds he will call.     Fabienne Bruns, MD [facility] [place] Office: [phone] Pager: [phone]

## 2021-04-04 ENCOUNTER — Other Ambulatory Visit: Payer: Self-pay

## 2021-04-04 DIAGNOSIS — I739 Peripheral vascular disease, unspecified: Secondary | ICD-10-CM

## 2021-04-27 ENCOUNTER — Encounter: Payer: Self-pay | Admitting: Internal Medicine

## 2021-04-27 ENCOUNTER — Ambulatory Visit (INDEPENDENT_AMBULATORY_CARE_PROVIDER_SITE_OTHER): Payer: Medicaid Other | Admitting: Internal Medicine

## 2021-04-27 ENCOUNTER — Other Ambulatory Visit: Payer: Self-pay

## 2021-04-27 ENCOUNTER — Encounter: Payer: Self-pay | Admitting: Dietician

## 2021-04-27 ENCOUNTER — Encounter: Payer: Medicaid Other | Attending: Medical | Admitting: Dietician

## 2021-04-27 VITALS — BP 148/90 | HR 98 | Ht 68.0 in | Wt 254.0 lb

## 2021-04-27 DIAGNOSIS — Z794 Long term (current) use of insulin: Secondary | ICD-10-CM | POA: Diagnosis not present

## 2021-04-27 DIAGNOSIS — R809 Proteinuria, unspecified: Secondary | ICD-10-CM

## 2021-04-27 DIAGNOSIS — E11319 Type 2 diabetes mellitus with unspecified diabetic retinopathy without macular edema: Secondary | ICD-10-CM | POA: Diagnosis not present

## 2021-04-27 DIAGNOSIS — E1129 Type 2 diabetes mellitus with other diabetic kidney complication: Secondary | ICD-10-CM | POA: Diagnosis not present

## 2021-04-27 DIAGNOSIS — E1159 Type 2 diabetes mellitus with other circulatory complications: Secondary | ICD-10-CM

## 2021-04-27 DIAGNOSIS — E1165 Type 2 diabetes mellitus with hyperglycemia: Secondary | ICD-10-CM

## 2021-04-27 DIAGNOSIS — E1142 Type 2 diabetes mellitus with diabetic polyneuropathy: Secondary | ICD-10-CM

## 2021-04-27 DIAGNOSIS — E1151 Type 2 diabetes mellitus with diabetic peripheral angiopathy without gangrene: Secondary | ICD-10-CM | POA: Diagnosis not present

## 2021-04-27 DIAGNOSIS — E785 Hyperlipidemia, unspecified: Secondary | ICD-10-CM | POA: Diagnosis not present

## 2021-04-27 DIAGNOSIS — N529 Male erectile dysfunction, unspecified: Secondary | ICD-10-CM

## 2021-04-27 LAB — MICROALBUMIN / CREATININE URINE RATIO
Creatinine,U: 168.6 mg/dL
Microalb Creat Ratio: 206.4 mg/g — ABNORMAL HIGH (ref 0.0–30.0)
Microalb, Ur: 347.9 mg/dL — ABNORMAL HIGH (ref 0.0–1.9)

## 2021-04-27 LAB — POCT GLYCOSYLATED HEMOGLOBIN (HGB A1C): Hemoglobin A1C: 8 % — AB (ref 4.0–5.6)

## 2021-04-27 LAB — LUTEINIZING HORMONE: LH: 2.88 m[IU]/mL (ref 1.50–9.30)

## 2021-04-27 LAB — POCT GLUCOSE (DEVICE FOR HOME USE): Glucose Fasting, POC: 116 mg/dL — AB (ref 70–99)

## 2021-04-27 MED ORDER — ATORVASTATIN CALCIUM 20 MG PO TABS: 20.0000 mg | ORAL_TABLET | Freq: Every day | ORAL | 3 refills | Status: DC

## 2021-04-27 MED ORDER — LANTUS SOLOSTAR 100 UNIT/ML ~~LOC~~ SOPN: 34.0000 [IU] | PEN_INJECTOR | Freq: Every day | SUBCUTANEOUS | 3 refills | Status: DC

## 2021-04-27 MED ORDER — EMPAGLIFLOZIN 10 MG PO TABS: 10.0000 mg | ORAL_TABLET | Freq: Every day | ORAL | 1 refills | Status: DC

## 2021-04-27 NOTE — Patient Instructions (Addendum)
-   Continue Lantus 34 units daily  - Change HUmalog 10 units with meals  - Start Jardiance 10 mg daily   Take 20 mg of Atorvastatin     HOW TO TREAT LOW BLOOD SUGARS (Blood sugar LESS THAN 70 MG/DL) Please follow the RULE OF 15 for the treatment of hypoglycemia treatment (when your (blood sugars are less than 70 mg/dL)   STEP 1: Take 15 grams of carbohydrates when your blood sugar is low, which includes:  3-4 GLUCOSE TABS  OR 3-4 OZ OF JUICE OR REGULAR SODA OR ONE TUBE OF GLUCOSE GEL    STEP 2: RECHECK blood sugar in 15 MINUTES STEP 3: If your blood sugar is still low at the 15 minute recheck --> then, go back to STEP 1 and treat AGAIN with another 15 grams of carbohydrates.

## 2021-04-27 NOTE — Patient Instructions (Signed)
Reduce the amount of oil that you use. Avoid or limit the amount of sugar or honey you use in your tea. Less bread Eat less.  Listen to your body and stop eating when you are satisfied.  Continue to check your blood sugar as prescribed Continue to take your medication as prescribed  Continue to stay active and walk daily

## 2021-04-27 NOTE — Progress Notes (Signed)
Diabetes Self-Management Education  Visit Type: Follow-up  Appt. Start Time: 0905 Appt. End Time: 0935 Appointment started with Interpretor via the computer [facility] [phone]. Interpretor through a [employer] completed the appointment.  04/27/2021  [name], identified by name and date of birth, is a 46 y.o. male with a diagnosis of Diabetes:  .   ASSESSMENT  Patient is not using the Dexcom.  After 3 failed sensors he decided not to use it despite help from his friend, call to [facility], and additional visits with myself. He tests he glucose once daily.  Fasting 120-150 usually and occasionally 200-250.   He eats only 2 meals per day and avoids dinner. He complains of increased bloating and fullness after lunch.  He is overeating. He would like to learn how to lose weight.  Weight has increased 10 lbs in the past 3 months. He uses increased amounts of olive oil at times. Sweetens tea with 1-2 tsp sugar. He reports taking his medication regularly but misses the Humalog at night as he does not eat. He is nervous about developing hypoglycemia which has happened once. He states that he is walking 30-60 minutes daily. Fasting glucose 116 today and A1C 8% which is slightly decreased.  History includes Type 2 Diabetes (2008), HTN, dyslipidemia, PVD, PVD, depression, dentures Smokes, eats 2 meals per day. Labs:  04/27/2021 A1C 8%, 11/06/2020:  Cholesterol 191, HDL 36, LDL 113, Triglycerides 219, A1c 8.6%, GFR 92 Dexcom G6 241 lbs 11/2020   Medication includes Lantus 34 units each am, Humalog 10 units before each meal. Patient lives alone.  He is from [place]. Patient states that he has not worked since 2020 due to health and injuries.  He states that not working has caused him depression.  He does feel alone as his friends had to leave as he cannot support them. He is not on disability. He does receive food stamps.  Weight 255 lb (115.7 kg). Body mass index is 38.77  kg/m.   Diabetes Self-Management Education - 04/27/21 1000       Visit Information   Visit Type Follow-up      Pre-Education Assessment   Patient understands the diabetes disease and treatment process. Demonstrates understanding / competency    Patient understands incorporating nutritional management into lifestyle. Needs Review    Patient undertands incorporating physical activity into lifestyle. Demonstrates understanding / competency    Patient understands using medications safely. Demonstrates understanding / competency    Patient understands monitoring blood glucose, interpreting and using results Needs Review    Patient understands prevention, detection, and treatment of acute complications. Needs Review    Patient understands prevention, detection, and treatment of chronic complications. Demonstrates understanding / competency    Patient understands how to develop strategies to address psychosocial issues. Demonstrates understanding / competency    Patient understands how to develop strategies to promote health/change behavior. Needs Review      Complications   How often do you check your blood sugar? 1-2 times/day    Fasting Blood glucose range (mg/dL) 55-732;202-542;706-237;>628    Number of hypoglycemic episodes per month 0    Can you tell when your blood sugar is low? Yes    What do you do if your blood sugar is low? eats fruit      Dietary Intake   Breakfast 2 eggs, 1 avocado, 2 slices [employer] toast, occasional cheese, occasional salad with oil and vinegar dressing, 1 cup tea with 2 tsp sugar or  honey   8-10 am   Lunch grilled chicken or fish, vegetables, fruit, 2 slices bread   1-2 pm   Snack (afternoon) occasional chips    Dinner none    Snack (evening) none    Beverage(s) water, tea with 2 tsp sugar or honey, coffee with small amoutn regular vanilla creamer      Exercise   Exercise Type Light (walking / raking leaves)    How many days per week to you exercise? 7    How  many minutes per day do you exercise? 45    Total minutes per week of exercise 315      Patient Education   Previous Diabetes Education Yes (please comment)   01/2021   Nutrition management  Meal options for control of blood glucose level and chronic complications.;Meal timing in regards to the patients' current diabetes medication.    Physical activity and exercise  Other (comment)   encouraged to continue   Medications Reviewed patients medication for diabetes, action, purpose, timing of dose and side effects.    Acute complications Taught treatment of hypoglycemia - the 15 rule.      Individualized Goals (developed by patient)   Nutrition General guidelines for healthy choices and portions discussed    Physical Activity Exercise 5-7 days per week;60 minutes per day    Medications take my medication as prescribed    Monitoring  test my blood glucose as discussed      Patient Self-Evaluation of Goals - Patient rates self as meeting previously set goals (% of time)   Nutrition >75%    Physical Activity >75%    Medications >75%    Monitoring >75%    Problem Solving >75%    Reducing Risk >75%    Health Coping >75%      Post-Education Assessment   Patient understands the diabetes disease and treatment process. Demonstrates understanding / competency    Patient understands incorporating nutritional management into lifestyle. Needs Review    Patient undertands incorporating physical activity into lifestyle. Demonstrates understanding / competency    Patient understands using medications safely. Demonstrates understanding / competency    Patient understands monitoring blood glucose, interpreting and using results Demonstrates understanding / competency    Patient understands prevention, detection, and treatment of acute complications. Demonstrates understanding / competency    Patient understands prevention, detection, and treatment of chronic complications. Demonstrates understanding /  competency    Patient understands how to develop strategies to address psychosocial issues. Demonstrates understanding / competency    Patient understands how to develop strategies to promote health/change behavior. Needs Review      Outcomes   Expected Outcomes Demonstrated interest in learning. Expect positive outcomes    Future DMSE 3-4 months    Program Status Not Completed      Subsequent Visit   Since your last visit have you experienced any weight changes? Gain    Weight Gain (lbs) 10             Individualized Plan for Diabetes Self-Management Training:   Learning Objective:  Patient will have a greater understanding of diabetes self-management. Patient education plan is to attend individual and/or group sessions per assessed needs and concerns.   Plan:   Patient Instructions  Reduce the amount of oil that you use. Avoid or limit the amount of sugar or honey you use in your tea. Less bread Eat less.  Listen to your body and stop eating when you are satisfied.  Continue  to check your blood sugar as prescribed Continue to take your medication as prescribed  Continue to stay active and walk daily  Expected Outcomes:  Demonstrated interest in learning. Expect positive outcomes  Education material provided:   If problems or questions, patient to contact team via:  Phone  Future DSME appointment: 3-4 months

## 2021-04-27 NOTE — Progress Notes (Addendum)
Name: Thomas [name]  Age/ Sex: 46 y.o., male   MRN/ DOB: [id number], 1975-07-22     PCP: Elise Benne   Reason for Endocrinology Evaluation: Type 2 Diabetes Mellitus  Initial Endocrine Consultative Visit: 09/15/2020    PATIENT IDENTIFIER: [name] is a 46 y.o. male with a past medical history of T2DM, HTN, and dyslipidemia. The patient has followed with Endocrinology clinic since 09/15/2020 for consultative assistance with management of his diabetes.  DIABETIC HISTORY:  Mr. Maye was diagnosed with DM in 2008 .  He was on metformin in the past. His hemoglobin A1c has ranged from 6.7% in 2021, peaking at 15.8% in 2020  On his initial visit with me he had an A1c of 11.3%, he was only on basal insulin, he was having a lot of heartburn, and GI symptoms and we opted to start him on prandial insulin  Patient had connectivity issue with Dexcom, stopped using it 2022-not interested in freestyle libre SUBJECTIVE:   During the last visit (12/22/2020): A1c 8.6%, we adjusted MDI regimen       Today (04/27/2021): [name] is here for follow-up on diabetes management.  He checks his blood sugars 1 times daily.  The patient has not had hypoglycemic episodes since the last clinic visit.  His GI symptoms have resolved  Has ED for the past  few years  He has dizziness -up-to-date ophthalmology exam    He receives his insulin through the [facility]   Saw vascular surgeon 04/03/2021  Try the Dexcom but he was having connectivity issues and got tired of using it, he is not interested in freestyle libre  He typically is 2 meals a day (skip supper)    HOME DIABETES REGIMEN:  Humalog 10 units with each meal  Lantus 34  units daily     Statin: Yes ACE-I/ARB: Yes Prior Diabetic Education: Yes    DIABETIC COMPLICATIONS: Microvascular complications:   Denies: CKD, neuropathy, retinopathy Last Eye Exam: Completed 03/2021  Macrovascular  complications:  PVD ( S/P right external iliac angioplasty and stent (04/19/2019) followed by bypass 08/2020 Denies: CAD, CVA   HISTORY:  Past Medical History:  Past Medical History:  Diagnosis Date   Depression 01/27/2015   Diabetes mellitus without complication ([place])    Hyperlipidemia    Hypertension    Peripheral vascular disease ([place])    Past Surgical History:  Past Surgical History:  Procedure Laterality Date   ABDOMINAL AORTOGRAM W/LOWER EXTREMITY Right 04/19/2019   Procedure: ABDOMINAL AORTOGRAM W/LOWER EXTREMITY;  Surgeon: Marty Heck, MD;  Location: [place] CV LAB;  Service: Cardiovascular;  Laterality: Right;   ABDOMINAL AORTOGRAM W/LOWER EXTREMITY Right 09/16/2019   Procedure: ABDOMINAL AORTOGRAM W/LOWER EXTREMITY;  Surgeon: Marty Heck, MD;  Location: [place] [facility] LAB;  Service: Cardiovascular;  Laterality: Right;   ABDOMINAL AORTOGRAM W/LOWER EXTREMITY N/A 08/17/2020   Procedure: ABDOMINAL AORTOGRAM W/LOWER EXTREMITY;  Surgeon: Marty Heck, MD;  Location: [place] [facility] LAB;  Service: Cardiovascular;  Laterality: N/A;   FEMORAL-TIBIAL BYPASS GRAFT Right 04/22/2019   Procedure: Right  FEMORAL-TIBIAL ARTERY BYPASS GRAFT with Harvest of Saphenous vein RIGHT SUPERFICIAL FEMORAL ARTERY TO TIBIOPERONEAL TRUNK BYPASS;  Surgeon: Marty Heck, MD;  Location: [place];  Service: Vascular;  Laterality: Right;   IR CERVICAL/THORACIC DISC ASPIRATION W/IMAG GUIDE  04/15/2019   IR FLUORO GUIDED NEEDLE PLC ASPIRATION/INJECTION LOC  04/15/2019   kidney stones  2009   PERIPHERAL VASCULAR BALLOON ANGIOPLASTY Right 09/16/2019   Procedure:  PERIPHERAL VASCULAR BALLOON ANGIOPLASTY;  Surgeon: Marty Heck, MD;  Location: [place] [facility] LAB;  Service: Cardiovascular;  Laterality: Right;  ext iliac DCB   PERIPHERAL VASCULAR BALLOON ANGIOPLASTY  08/17/2020   Procedure: PERIPHERAL VASCULAR BALLOON ANGIOPLASTY;  Surgeon: Marty Heck, MD;  Location: [place]  CV LAB;  Service: Cardiovascular;;  Right TP trunk   PERIPHERAL VASCULAR BALLOON ANGIOPLASTY Left 08/24/2020   Procedure: PERIPHERAL VASCULAR BALLOON ANGIOPLASTY;  Surgeon: Marty Heck, MD;  Location: [place] CV LAB;  Service: Cardiovascular;  Laterality: Left;  Tibioperoneal trunk and superficial femoral arteries.   PERIPHERAL VASCULAR INTERVENTION  04/19/2019   Procedure: PERIPHERAL VASCULAR INTERVENTION;  Surgeon: Marty Heck, MD;  Location: [place] [facility] LAB;  Service: Cardiovascular;;  right external iliac   TEE WITHOUT CARDIOVERSION N/A 04/19/2019   Procedure: TRANSESOPHAGEAL ECHOCARDIOGRAM (TEE);  Surgeon: Donato Heinz, MD;  Location[facility] ENDOSCOPY;  Service: Endoscopy;  Laterality: N/A;   Social History:  reports that he has been smoking cigarettes. He has been smoking an average of 1 pack per day. He has quit using smokeless tobacco. He reports that he does not drink alcohol and does not use drugs. Family History:  Family History  Problem Relation Age of Onset   Colon cancer Neg Hx    Esophageal cancer Neg Hx    Pancreatic cancer Neg Hx    Liver cancer Neg Hx    Stomach cancer Neg Hx      HOME MEDICATIONS: Allergies as of 04/27/2021   No Known Allergies      Medication List        Accurate as of April 27, 2021  9:55 AM. If you have any questions, ask your nurse or doctor.          acetaminophen 500 MG tablet Commonly known as: TYLENOL Take 500 mg by mouth every 6 (six) hours as needed.   aspirin EC 81 MG tablet Take 1 tablet (81 mg total) by mouth daily. Swallow whole.   atorvastatin 20 MG tablet Commonly known as: LIPITOR Take 1 tablet (20 mg total) by mouth daily.   atorvastatin 10 MG tablet Commonly known as: LIPITOR Take 10 mg by mouth daily.   clopidogrel 75 MG tablet Commonly known as: PLAVIX TAKE 1 TABLET BY MOUTH DAILY WITH BREAKFAST   Dexcom G6 Sensor Misc 1 Device by Does not apply route as directed.   Dexcom G6  Transmitter Misc 1 Device by Does not apply route as directed.   Heating Pads Pads Use over the counter heating pads as needed for back pain   insulin lispro 100 UNIT/ML KwikPen Commonly known as: HumaLOG KwikPen Inject 10 Units into the skin 3 (three) times daily.   Insulin Pen Needle 29G X 5MM Misc 1 Device by Does not apply route in the morning, at noon, in the evening, and at bedtime.   Insulin Pen Needle 31G X 5 MM Misc 1 Device by Does not apply route 4 (four) times daily.   Lantus SoloStar 100 UNIT/ML Solostar Pen Generic drug: insulin glargine INJECT 20 TO 34 UNITS SUBCUTANEOUSLY ONCE DAILY   lisinopril 20 MG tablet Commonly known as: ZESTRIL Take 1 tablet (20 mg total) by mouth daily.   OMEGA-3 EPA FISH OIL PO Take 2,000 Units by mouth daily.   omeprazole 20 MG capsule Commonly known as: PRILOSEC Take 1 capsule (20 mg total) by mouth daily.   OVER THE COUNTER MEDICATION [employer] Ultra Sugar aid  Beet gummies  Arginine and Citrulline         OBJECTIVE:   Vital Signs: BP (!) 148/90 (BP Location: Left Arm, Patient Position: Sitting, Cuff Size: Large)    Pulse 98    Ht 5\' 8"  (1.727 m)    Wt 254 lb (115.2 kg)    SpO2 99%    BMI 38.62 kg/m   Wt Readings from Last 3 Encounters:  04/27/21 254 lb (115.2 kg)  02/06/21 243 lb (110.2 kg)  01/30/21 245 lb (111.1 kg)     Exam: General: Pt appears well and is in NAD  Lungs: Clear with good BS bilat   Heart: RRR   Abdomen: Normoactive bowel sounds, soft, nontender, without masses or organomegaly palpable  Extremities: No pretibial edema.   Neuro: MS is good with appropriate affect, pt is alert and Ox3      DATA REVIEWED:  Lab Results  Component Value Date   HGBA1C 8.0 (A) 04/27/2021   HGBA1C 8.6 (H) 11/06/2020   HGBA1C 11.3 (H) 08/03/2020    Latest Reference Range & Units 04/27/21 10:28  Testosterone, Total, LC-MS-MS 250 - 1,100 ng/dL 409   Lab Results  Component Value Date   CHOL 168  02/07/2021   HDL 30.20 (L) 02/07/2021   LDLCALC 108 (H) 08/03/2020   LDLDIRECT 104.0 02/07/2021   TRIG 222.0 (H) 02/07/2021   CHOLHDL 6 02/07/2021        Latest Reference Range & Units 04/27/21 10:28  MICROALB/CREAT RATIO 0.0 - 30.0 mg/g 206.4 (H)     Latest Reference Range & Units 04/27/21 10:28  LH 1.50 - 9.30 mIU/mL 2.88     ASSESSMENT / PLAN / RECOMMENDATIONS:   1) Type 2 Diabetes Mellitus, with improving glycemic control , With neuropathic, retinopathic and macrovascular complications as well as microalbuminuria- Most recent A1c of 8.0%. Goal A1c <7.0%.     -I have praised the patient on improved glycemic control  -He received his insulin from the [place]  -He was not very successful at using Dexcom, not interested in freestyle [place] -Discussed adding SGLT2 inhibitors, cautioned against genital infections    MEDICATIONS: Continue Lantus 34 units daily  Continue HUmalog 10 units with each meal  Start Jardiance 10 mg daily   EDUCATION / INSTRUCTIONS: BG monitoring instructions: Patient is instructed to check his blood sugars 2 times daily before meals times a day. Call [facility] [facility] if: BG persistently < 70  I reviewed the Rule of 15 for the treatment of hypoglycemia in detail with the patient. Literature supplied.   2) Diabetic complications:  Eye: Does not have known diabetic retinopathy.  Neuro/ Feet: Does  have known diabetic peripheral neuropathy .  Renal: Patient does not have known baseline CKD. He   is  on an ACEI/ARB at present.    3) Dyslipidemia:   -His LDL continues to be above goal on most recent labs in 10/2020 at 113 mg/DL.  His TG is as elevated as well.  -LDL goal<70 mg/DL -I have attempted to increase his atorvastatin from 10 mg to 20 mg daily, but today he continues to be on the 10 mg dose.  I have advised the patient again to double up on his atorvastatin until the new prescription, and to start taking 1 tablet  of 20 mg of the new bottle   Medication Increase atorvastatin to 20 mg daily  4) Dizziness:  -He is up-to-date on ophthalmological exam -This seems to be sporadic -I have asked the patient to  check glucose during these episodes and encouraged hydration   5) Erectile dysfunction:  -This is multifaceted given organic versus neurologic versus psychologic versus medication side effect. -Testosterone is normal , will refer to urology for further management    6) Microalbuminuria:  -MA/CR elevated, patient on lisinopril will encourage compliance.  We will not increase at this time due to his complaints of dizziness -We will continue to monitor  F/U in 4 months   Signed electronically by: Mack Guise, MD  [facility] Endocrinology  [facility] [address] [place], [place] [address] Phone: [phone] FAX: [phone]   CC: Elise Benne [id number] [place] [address] Phone: [phone]  Fax: [phone]  Return to Endocrinology clinic as below: Future Appointments  Date Time Provider [place]  04/27/2021 10:10 AM Farley Crooker, Melanie Crazier, MD [facility] None

## 2021-04-30 ENCOUNTER — Other Ambulatory Visit (HOSPITAL_COMMUNITY): Payer: Self-pay

## 2021-04-30 ENCOUNTER — Telehealth: Payer: Self-pay

## 2021-04-30 LAB — TESTOSTERONE, TOTAL, LC/MS/MS: Testosterone, Total, LC-MS-MS: 409 ng/dL (ref 250–1100)

## 2021-04-30 NOTE — Telephone Encounter (Signed)
Patient Advocate Encounter  Prior Authorization for Jardiance 10mg  tabs has been approved.    PA#  Effective dates: 04/30/21 through 04/30/22  Per Test Claim Patients co-pay is $4   Spoke with Pharmacy to Process.  Patient Advocate Fax: [phone]

## 2021-05-01 NOTE — Addendum Note (Signed)
Addended by: Dorita Sciara on: 05/01/2021 01:58 PM   Modules accepted: Orders

## 2021-07-09 ENCOUNTER — Ambulatory Visit: Payer: Medicaid Other | Admitting: Urology

## 2021-08-27 ENCOUNTER — Other Ambulatory Visit: Payer: Self-pay

## 2021-08-27 ENCOUNTER — Other Ambulatory Visit: Payer: Self-pay | Admitting: Medical

## 2021-08-27 MED ORDER — EMPAGLIFLOZIN 10 MG PO TABS: 10.0000 mg | ORAL_TABLET | Freq: Every day | ORAL | 0 refills | Status: DC

## 2021-08-27 NOTE — Telephone Encounter (Signed)
Rx sent 

## 2021-08-27 NOTE — Telephone Encounter (Signed)
Majeed (friend) called stating that pt's omeprezole needed Prior Auth sent to the pharmacy in order to get it refilled.  ?

## 2021-08-31 ENCOUNTER — Encounter: Payer: Medicaid Other | Attending: Internal Medicine | Admitting: Skilled Nursing Facility1

## 2021-08-31 ENCOUNTER — Encounter: Payer: Self-pay | Admitting: Internal Medicine

## 2021-08-31 ENCOUNTER — Ambulatory Visit (INDEPENDENT_AMBULATORY_CARE_PROVIDER_SITE_OTHER): Payer: Medicaid Other | Admitting: Internal Medicine

## 2021-08-31 ENCOUNTER — Encounter: Payer: Self-pay | Admitting: Skilled Nursing Facility1

## 2021-08-31 VITALS — BP 140/82 | HR 100 | Ht 66.5 in | Wt 252.0 lb

## 2021-08-31 DIAGNOSIS — Z794 Long term (current) use of insulin: Secondary | ICD-10-CM

## 2021-08-31 DIAGNOSIS — E1159 Type 2 diabetes mellitus with other circulatory complications: Secondary | ICD-10-CM | POA: Insufficient documentation

## 2021-08-31 DIAGNOSIS — E11319 Type 2 diabetes mellitus with unspecified diabetic retinopathy without macular edema: Secondary | ICD-10-CM | POA: Diagnosis not present

## 2021-08-31 DIAGNOSIS — R809 Proteinuria, unspecified: Secondary | ICD-10-CM | POA: Diagnosis not present

## 2021-08-31 DIAGNOSIS — E1129 Type 2 diabetes mellitus with other diabetic kidney complication: Secondary | ICD-10-CM

## 2021-08-31 DIAGNOSIS — E1142 Type 2 diabetes mellitus with diabetic polyneuropathy: Secondary | ICD-10-CM

## 2021-08-31 DIAGNOSIS — E1165 Type 2 diabetes mellitus with hyperglycemia: Secondary | ICD-10-CM | POA: Diagnosis not present

## 2021-08-31 DIAGNOSIS — E785 Hyperlipidemia, unspecified: Secondary | ICD-10-CM

## 2021-08-31 DIAGNOSIS — E1151 Type 2 diabetes mellitus with diabetic peripheral angiopathy without gangrene: Secondary | ICD-10-CM

## 2021-08-31 DIAGNOSIS — R739 Hyperglycemia, unspecified: Secondary | ICD-10-CM

## 2021-08-31 LAB — BASIC METABOLIC PANEL
BUN: 16 mg/dL (ref 6–23)
CO2: 25 mEq/L (ref 19–32)
Calcium: 9.3 mg/dL (ref 8.4–10.5)
Chloride: 105 mEq/L (ref 96–112)
Creatinine, Ser: 1.08 mg/dL (ref 0.40–1.50)
GFR: 83.27 mL/min (ref >60.00)
Glucose, Bld: 146 mg/dL — ABNORMAL HIGH (ref 70–99)
Potassium: 4.3 mEq/L (ref 3.5–5.1)
Sodium: 139 mEq/L (ref 135–145)

## 2021-08-31 LAB — LIPID PANEL
Cholesterol: 149 mg/dL (ref 0–200)
HDL: 33 mg/dL — ABNORMAL LOW (ref >39.00)
LDL Cholesterol: 95 mg/dL (ref 0–99)
NonHDL: 116.04
Total CHOL/HDL Ratio: 5
Triglycerides: 104 mg/dL (ref 0.0–149.0)
VLDL: 20.8 mg/dL (ref 0.0–40.0)

## 2021-08-31 LAB — POCT GLUCOSE (DEVICE FOR HOME USE): Glucose Fasting, POC: 147 mg/dL — AB (ref 70–99)

## 2021-08-31 LAB — POCT GLYCOSYLATED HEMOGLOBIN (HGB A1C): Hemoglobin A1C: 7.5 % — AB (ref 4.0–5.6)

## 2021-08-31 MED ORDER — EMPAGLIFLOZIN 25 MG PO TABS: 25.0000 mg | ORAL_TABLET | Freq: Every day | ORAL | 3 refills | Status: DC

## 2021-08-31 NOTE — Progress Notes (Signed)
Name: Thomas [name]  Age/ Sex: 46 y.o., male   MRN/ DOB: [id number], Mar 27, 1976     PCP: Marisue Brooklyn   Reason for Endocrinology Evaluation: Type 2 Diabetes Mellitus  Initial Endocrine Consultative Visit: 09/15/2020    PATIENT IDENTIFIER: Thomas [name] is a 46 y.o. male with a past medical history of T2DM, HTN, and dyslipidemia. The patient has followed with Endocrinology clinic since 09/15/2020 for consultative assistance with management of his diabetes.  DIABETIC HISTORY:  [name] was diagnosed with DM in 2008 .  He was on metformin in the past. His hemoglobin A1c has ranged from 6.7% in 2021, peaking at 15.8% in 2020  On his initial visit with me he had an A1c of 11.3%, he was only on basal insulin, he was having a lot of heartburn, and GI symptoms and we opted to start him on prandial insulin  Patient had connectivity issue with Dexcom, stopped using it 2022-not interested in freestyle [place]   Started jardiance 04/2021 SUBJECTIVE:   During the last visit (04/27/2021): A1c 8.0 %, we adjusted MDI regimen       Today (08/31/2021): [name] is here for follow-up on diabetes management.  He checks his blood sugars 1 times daily.  The patient has not had hypoglycemic episodes since the last clinic visit.  He receives his insulin through the [facility] health department   Has occasional nausea and vomiting Heartburn , has PPI   Has used warming machine on the feet but developed a skin rash    HOME DIABETES REGIMEN:  Humalog 10 units with each meal  Lantus 34  units daily  Jardiance 10 mg daily     Statin: Yes ACE-I/ARB: Yes Prior Diabetic Education: Yes    DIABETIC COMPLICATIONS: Microvascular complications:   Denies: CKD, neuropathy, retinopathy Last Eye Exam: Completed 03/2021  Macrovascular complications:  PVD ( S/P right external iliac angioplasty and stent (04/19/2019) followed by bypass 08/2020 Denies: CAD, CVA   HISTORY:  Past Medical  History:  Past Medical History:  Diagnosis Date   Depression 01/27/2015   Diabetes mellitus without complication ([facility])    Hyperlipidemia    Hypertension    Peripheral vascular disease ([facility])    Past Surgical History:  Past Surgical History:  Procedure Laterality Date   ABDOMINAL AORTOGRAM W/LOWER EXTREMITY Right 04/19/2019   Procedure: ABDOMINAL AORTOGRAM W/LOWER EXTREMITY;  Surgeon: [name], MD;  [facility];  Service: Cardiovascular;  Laterality: Right;   ABDOMINAL AORTOGRAM W/LOWER EXTREMITY Right 09/16/2019   Procedure: ABDOMINAL AORTOGRAM W/LOWER EXTREMITY;  Surgeon: [name], MD;  Location: [facility];  Service: Cardiovascular;  Laterality: Right;   ABDOMINAL AORTOGRAM W/LOWER EXTREMITY N/A 08/17/2020   Procedure: ABDOMINAL AORTOGRAM W/LOWER EXTREMITY;  Surgeon: [name], MD;  Location: [facility];  Service: Cardiovascular;  Laterality: N/A;   FEMORAL-TIBIAL BYPASS GRAFT Right 04/22/2019   Procedure: Right  FEMORAL-TIBIAL ARTERY BYPASS GRAFT with Harvest of Saphenous vein RIGHT SUPERFICIAL FEMORAL ARTERY TO TIBIOPERONEAL TRUNK BYPASS;  Surgeon: [name], MD;  Location: [facility];  Service: Vascular;  Laterality: Right;   IR CERVICAL/THORACIC DISC ASPIRATION W/IMAG GUIDE  04/15/2019   IR FLUORO GUIDED NEEDLE PLC ASPIRATION/INJECTION LOC  04/15/2019   kidney stones  2009   PERIPHERAL VASCULAR BALLOON ANGIOPLASTY Right 09/16/2019   Procedure: PERIPHERAL VASCULAR BALLOON ANGIOPLASTY;  Surgeon: [name], MD;  [facility];  Service: Cardiovascular;  Laterality: Right;  ext iliac DCB  PERIPHERAL VASCULAR BALLOON ANGIOPLASTY  08/17/2020   Procedure: PERIPHERAL VASCULAR BALLOON ANGIOPLASTY;  Surgeon: [name], MD;  [facility];  Service: Cardiovascular;;  Right TP trunk   PERIPHERAL VASCULAR BALLOON ANGIOPLASTY Left 08/24/2020   Procedure: PERIPHERAL VASCULAR BALLOON  ANGIOPLASTY;  Surgeon: [name], MD;  [facility];  Service: Cardiovascular;  Laterality: Left;  Tibioperoneal trunk and superficial femoral arteries.   PERIPHERAL VASCULAR INTERVENTION  04/19/2019   Procedure: PERIPHERAL VASCULAR INTERVENTION;  Surgeon: [name], MD;  Location: [facility] INVASIVE CV LAB;  Service: Cardiovascular;;  right external iliac   TEE WITHOUT CARDIOVERSION N/A 04/19/2019   Procedure: TRANSESOPHAGEAL ECHOCARDIOGRAM (TEE);  Surgeon: [name], MD;  Location: [facility] ENDOSCOPY;  Service: Endoscopy;  Laterality: N/A;   Social History:  reports that he has been smoking cigarettes. He has been smoking an average of 1 pack per day. He has quit using smokeless tobacco. He reports that he does not drink alcohol and does not use drugs. Family History:  Family History  Problem Relation Age of Onset   Colon cancer Neg Hx    Esophageal cancer Neg Hx    Pancreatic cancer Neg Hx    Liver cancer Neg Hx    Stomach cancer Neg Hx      HOME MEDICATIONS: Allergies as of 08/31/2021   No Known Allergies      Medication List        Accurate as of Aug 31, 2021  6:15 PM. If you have any questions, ask your nurse or doctor.          acetaminophen 500 MG tablet Commonly known as: TYLENOL Take 500 mg by mouth every 6 (six) hours as needed.   atorvastatin 20 MG tablet Commonly known as: LIPITOR Take 1 tablet (20 mg total) by mouth daily.   clopidogrel 75 MG tablet Commonly known as: PLAVIX TAKE ONE TABLET BY MOUTH DAILY WITH BREAKFAST   empagliflozin 25 MG Tabs tablet Commonly known as: Jardiance Take 1 tablet (25 mg total) by mouth daily before breakfast. What changed:  medication strength how much to take Changed by: Scarlette Shorts, MD   Heating Pads Pads Use over the counter heating pads as needed for back pain   insulin lispro 100 UNIT/ML KwikPen Commonly known as: HumaLOG KwikPen Inject 10 Units into the skin 3  (three) times daily.   Insulin Pen Needle 29G X Misc 1 Device by Does not apply route in the morning, at noon, in the evening, and at bedtime.   Insulin Pen Needle 31G X 5 MM Misc 1 Device by Does not apply route 4 (four) times daily.   Lantus SoloStar 100 UNIT/ML Solostar Pen Generic drug: insulin glargine Inject 34 Units into the skin daily.   lisinopril 20 MG tablet Commonly known as: ZESTRIL TAKE ONE TABLET BY MOUTH DAILY   OMEGA-3 EPA FISH OIL PO Take 2,000 Units by mouth daily.   omeprazole 20 MG capsule Commonly known as: PRILOSEC TAKE ONE CAPSULE BY MOUTH DAILY   OVER THE COUNTER MEDICATION [employer] Ultra Sugar aid  Beet gummies  Arginine and Citrulline         OBJECTIVE:   Vital Signs: BP 140/82 (BP Location: Left Arm, Patient Position: Sitting, Cuff Size: Large)   Pulse 100   Ht 5' 6.5" (1.689 m)   Wt 252 lb (114.3 kg)   SpO2 99%   BMI 40.06 kg/m   Wt Readings from Last 3  Encounters:  08/31/21 252 lb (114.3 kg)  08/31/21 252 lb (114.3 kg)  04/27/21 254 lb (115.2 kg)     Exam: General: Pt appears well and is in NAD  Lungs: Clear with good BS bilat   Heart: RRR   Abdomen: Normoactive bowel sounds, soft, nontender, without masses or organomegaly palpable  Extremities: No pretibial edema.   Neuro: MS is good with appropriate affect, pt is alert and Ox3   DM Foot exam 08/31/2021 The skin of the feet shows dried scabs at the dorsal dorsal B/L  The pedal pulses are undetectable  The sensation is absent  to a screening 5.07, 10 gram monofilament bilaterally   DATA REVIEWED:  Lab Results  Component Value Date   HGBA1C 7.5 (A) 08/31/2021   HGBA1C 8.0 (A) 04/27/2021   HGBA1C 8.6 (H) 11/06/2020    Latest Reference Range & Units 08/31/21 10:32  Sodium 135 - 145 mEq/L 139  Potassium 3.5 - 5.1 mEq/L 4.3  Chloride 96 - 112 mEq/L 105  CO2 19 - 32 mEq/L 25  Glucose 70 - 99 mg/dL 333 (H)  BUN 6 - 23 mg/dL 16  Creatinine 5.45 - 6.25 mg/dL  6.38  Calcium 8.4 - 93.7 mg/dL 9.3  GFR >34.28 mL/min 83.27     Latest Reference Range & Units 08/31/21 10:32  Total CHOL/HDL Ratio  5  Cholesterol 0 - 200 mg/dL 768  HDL Cholesterol >11.57 mg/dL 26.20 (L)  LDL (calc) 0 - 99 mg/dL 95  NonHDL  355.97  Triglycerides 0.0 - 149.0 mg/dL 416.3  VLDL 0.0 - 84.5 mg/dL 36.4      Latest Reference Range & Units 04/27/21 10:28  Testosterone, Total, LC-MS-MS 250 - 1,100 ng/dL 680     ASSESSMENT / PLAN / RECOMMENDATIONS:   1) Type 2 Diabetes Mellitus, with improving glycemic control , With neuropathic, retinopathic and macrovascular complications as well as microalbuminuria- Most recent A1c of 7.5%. Goal A1c <7.0%.     -I have praised the patient on improved glycemic control  -He receives his insulin from the [facility] health department  -He was not very successful at using Dexcom, not interested in freestyle libre -Tolerating Jardiance will increase    MEDICATIONS: Continue Lantus 34 units daily  Continue HUmalog 10 units with each meal  Increase Jardiance 25 mg daily   EDUCATION / INSTRUCTIONS: BG monitoring instructions: Patient is instructed to check his blood sugars 2 times daily before meals times a day. Call [place] [facility] if: BG persistently < 70  I reviewed the Rule of 15 for the treatment of hypoglycemia in detail with the patient. Literature supplied.   2) Diabetic complications:  Eye: Does not have known diabetic retinopathy.  Neuro/ Feet: Does  have known diabetic peripheral neuropathy .  Renal: Patient does not have known baseline CKD. He   is  on an ACEI/ARB at present.    3) Dyslipidemia:   -LDL trending down but continues to be above goal -We will continue to monitor at this time  Medication Continue atorvastatin 20 mg daily  4) Erectile dysfunction:   -His testosterone has been normal -This is most likely neuropathic as well as organic -Patient understands I do not prescribe Cialis nor  Viagra and we will have to discuss with PCP     F/U in 4 months   Signed electronically by: Lyndle Herrlich, MD  [facility] Endocrinology  [facility] [address] [place] [place], [place] [address] Phone: [phone] FAX: [phone]  CC: Marisue BrooklynSaguier, Edward, PA-C [address] [place] [id number] Phone: [phone]  Fax: [phone]  Return to Endocrinology clinic as below: Future Appointments  Date Time Provider Department Center  09/27/2021  7:30 AM Myles LippsScotece, Alexis B, RD [facility]  03/04/2022 10:30 AM Eamonn Sermeno, Konrad DoloresIbtehal Jaralla, MD [facility] None

## 2021-08-31 NOTE — Progress Notes (Signed)
Pt arrives with the use of [place] interpreter for Arabic  Pt states he will be seeing Dr. [name] today at 10am.   Pt states he started Jardiance about a month a go and is disappointed he did not lose weight with it.   Pt states he is takes his lantus about the same time every morning and taking his Humalog before each meal about 15 minutes.   Pt states he does take his lisinopril atorvastatin, omeprazole (as needed about a couple times a week), aspirin, clopidogel   Diabetes medications: Jardiance  Humolog Pt states he takes about 9.5-10 units of Humalog.  Lantus  Pt states he takes 34 units of Lantus   A1C 7.5    Pt states he will eat and then experience dizziness and lightheadedness every single day with every single meal about 30 minutes- 1 hour after eating starting about 2 months ago. Pt states somtiems his blood suagr will be 50 or 60 with the insulin.   Pts main concern is weight loss so clouding judgement for diabetes control   Pt seems to be Hypoglycemic unaware but is experiencing the typical symptoms.    Pt states he checks his blood suagrs sometimes in the morning before eating being about 150, 200, 120, 130. Pt states he infrequently checks his blood suagrs as he doe not like to prick his fingers. Pt states he does not want to wear a CGM as the alarm going off is embarrassing in front of his friends: Dietitian spent most of the appt educated pt on why that sensor is going off using the oil light in a car analogy which the pt seemed to respond really positively too, Dietitian also showed pt the POGO which the pt felt could be more doable for him: Dietitian advised she was not sure about insurance coverage.   Pt states he sometimes skips dinner due to a desire to lose weight: Dietitian educated pt on this strategy being a possible cause of uncontrolled blood sugars as well as increased meal size the next time he eats.   Dietitian Advised pt to check his blood sugars  daily and  when feeling headaches/doziness.   Pt states he has been only eating soft things for fear he will break his dentures: Dietitian advised pt if he feels no pain and his denture fit well he can eat just as if they were his own teeth: pt states he will work on feeling more confident with his dentures.  Pt states he drinks a lot of tea throughout the day with sugar: Dietitian advise since he does not like splenda he can either go sans sugar or try Monk fruit pt states he is definitely not drinking tea without sugar so he will try Monk fruit   Pt states he needs to start being more active: The appt ran out of time so will discuss next time    Goals: More fiber with complex carbohydrates More non starchy vegetables Take real sugar out of tea try monk fruit Do not skip dinner Start checking your blood sugar daily and if expericning symptoms   Diabetes Self-Management Education  Visit Type: Follow-up  08/31/2021  Mr. Dwana Curd, identified by name and date of birth, is a 46 y.o. male with a diagnosis of Diabetes:  .   ASSESSMENT  Height 5' 6.5" (1.689 m), weight 252 lb (114.3 kg). Body mass index is 40.06 kg/m.   Diabetes Self-Management Education - 08/31/21 0901  Visit Information   Visit Type Follow-up      Health Coping   How would you rate your overall health? Good      Psychosocial Assessment   Patient Belief/Attitude about Diabetes Other (comment)    What is the hardest part about your diabetes right now, causing you the most concern, or is the most worrisome to you about your diabetes?   Checking blood sugar;Other (comment)    Self-care barriers English as a second language    Self-management support Friends    Other persons present Interpreter    Patient Concerns Weight Control    Special Needs Other (comment)    Preferred Learning Style Auditory    Learning Readiness Contemplating    How often do you need to have someone help you when you read  instructions, pamphlets, or other written materials from your doctor or pharmacy? 4 - Often      Pre-Education Assessment   Patient understands the diabetes disease and treatment process. Comprehends key points    Patient understands incorporating nutritional management into lifestyle. Needs Review    Patient undertands incorporating physical activity into lifestyle. Needs Review    Patient understands using medications safely. Needs Review    Patient understands monitoring blood glucose, interpreting and using results Needs Review    Patient understands prevention, detection, and treatment of acute complications. Needs Review    Patient understands prevention, detection, and treatment of chronic complications. Compreheands key points    Patient understands how to develop strategies to address psychosocial issues. Needs Review    Patient understands how to develop strategies to promote health/change behavior. Needs Review      Complications   Last HgB A1C per patient/outside source 7.5 %    How often do you check your blood sugar? 3-4 times / week    Have you had a dilated eye exam in the past 12 months? Yes    Have you had a dental exam in the past 12 months? Yes      Dietary Intake   Breakfast 8-9am arabic flatbread bread + 3 boiled eggs somtiems cheese somtemt saald: toamto, lettuce, cucumber, olive oil, applecder vinegar or skipped    Snack (morning) some 70% dark chcoclate    Lunch chicken leg or breast or fish and rice  + rice noodle pasta somties potato or okra soup or lentils  soup    Snack (afternoon) chips or crackers with nuts on it    Dinner skipped or cheese sandwich somtiems with some chicken    Beverage(s) hot tea + 2 tsp sugar, water      Activity / Exercise   Activity / Exercise Type ADL's    How many days per week do you exercise? 0    How many minutes per day do you exercise? 0    Total minutes per week of exercise 0      Patient Education   Previous Diabetes  Education Yes (please comment)    Healthy Eating Reviewed blood glucose goals for pre and post meals and how to evaluate the patients' food intake on their blood glucose level.;Carbohydrate counting;Role of diet in the treatment of diabetes and the relationship between the three main macronutrients and blood glucose level    Medications Reviewed patients medication for diabetes, action, purpose, timing of dose and side effects.    Acute complications Taught prevention, symptoms, and  treatment of hypoglycemia - the 15 rule.      Individualized Goals (developed by patient)  Nutrition General guidelines for healthy choices and portions discussed;Follow meal plan discussed    Medications take my medication as prescribed    Monitoring  Test my blood glucose as discussed;Test blood glucose pre and post meals as discussed    Reducing Risk examine blood glucose patterns;treat hypoglycemia with 15 grams of carbs if blood glucose less than /dL      Patient Self-Evaluation of Goals - Patient rates self as meeting previously set goals (% of time)   Nutrition < 25% (hardly ever/never)    Physical Activity < 25% (hardly ever/never)    Medications >75% (most of the time)    Monitoring < 25% (hardly ever/never)    Problem Solving and behavior change strategies  < 25% (hardly ever/never)    Reducing Risk (treating acute and chronic complications) < 25% (hardly ever/never)    Health Coping 25 - 50% (sometimes)      Post-Education Assessment   Patient understands the diabetes disease and treatment process. Comprehends key points    Patient understands incorporating nutritional management into lifestyle. Comprehends key points    Patient undertands incorporating physical activity into lifestyle. Comprehends key points    Patient understands using medications safely. Comphrehends key points    Patient understands monitoring blood glucose, interpreting and using results Comprehends key points    Patient  understands prevention, detection, and treatment of acute complications. Comprehends key points    Patient understands prevention, detection, and treatment of chronic complications. Comprehends key points    Patient understands how to develop strategies to address psychosocial issues. Comprehends key points    Patient understands how to develop strategies to promote health/change behavior. Comprehends key points      Outcomes   Expected Outcomes Demonstrated interest in learning but significant barriers to change    Future DMSE 2 wks    Program Status Completed      Subsequent Visit   Since your last visit have you continued or begun to take your medications as prescribed? Yes    Since your last visit have you had your blood pressure checked? No    Since your last visit have you experienced any weight changes? Loss    Weight Loss (lbs) 3    Since your last visit, are you checking your blood glucose at least once a day? No             Individualized Plan for Diabetes Self-Management Training:   Learning Objective:  Patient will have a greater understanding of diabetes self-management. Patient education plan is to attend individual and/or group sessions per assessed needs and concerns.   Plan:  Listed above   Future DSME appointment: 2 wks

## 2021-08-31 NOTE — Patient Instructions (Addendum)
-   Continue Lantus 34 units daily  - Change HUmalog 10 units with meals  - Increase  Jardiance 25 mg daily   Take 20 mg of Atorvastatin        HOW TO TREAT LOW BLOOD SUGARS (Blood sugar LESS THAN 70 MG/DL) Please follow the RULE OF 15 for the treatment of hypoglycemia treatment (when your (blood sugars are less than 70 mg/dL)   STEP 1: Take 15 grams of carbohydrates when your blood sugar is low, which includes:  3-4 GLUCOSE TABS  OR 3-4 OZ OF JUICE OR REGULAR SODA OR ONE TUBE OF GLUCOSE GEL    STEP 2: RECHECK blood sugar in 15 MINUTES STEP 3: If your blood sugar is still low at the 15 minute recheck --> then, go back to STEP 1 and treat AGAIN with another 15 grams of carbohydrates.

## 2021-09-27 ENCOUNTER — Encounter: Payer: Medicaid Other | Attending: Internal Medicine | Admitting: Skilled Nursing Facility1

## 2021-09-27 ENCOUNTER — Encounter: Payer: Self-pay | Admitting: Skilled Nursing Facility1

## 2021-09-27 DIAGNOSIS — Z794 Long term (current) use of insulin: Secondary | ICD-10-CM | POA: Diagnosis not present

## 2021-09-27 DIAGNOSIS — E1159 Type 2 diabetes mellitus with other circulatory complications: Secondary | ICD-10-CM | POA: Diagnosis not present

## 2021-09-27 NOTE — Progress Notes (Signed)
Pt arrives with the use of [place] interpreter for Arabic   Pt states he started [place] about a month a go and is disappointed he did not lose weight with it.   Pt states he does take his lisinopril atorvastatin, omeprazole (as needed about a couple times a week), aspirin, clopidogel   Diabetes medications: Jardiance  Humolog Pt states he takes about 9.5-10 units of Humalog.  Lantus  Pt states he takes 34 units of Lantus   A1C 7.5    Pt states he will eat and then experience dizziness and lightheadedness every single day with every single meal about 30 minutes- 1 hour after eating starting about 2 months ago. Pt states somtiems his blood suagr will be 50 or 60 with the insulin.   Pts main concern is weight loss so clouding judgement for diabetes control: this continues; pt is also extrmeely difficult to communicate with, NOT due to language   Pt seems to be Hypoglycemic unaware but is experiencing the typical symptoms.    Pt states he checks his blood suagrs sometimes in the morning before eating being about 150, 200, 120, 130. Pt states he infrequently checks his blood suagrs as he doe not like to prick his fingers. Pt states he does not want to wear a CGM as the alarm going off is embarrassing in front of his friends: Dietitian spent most of the appt educated pt on why that sensor is going off using the oil light in a car analogy which the pt seemed to respond really positively too, Dietitian also showed pt the POGO which the pt felt could be more doable for him: Dietitian advised she was not sure about insurance coverage.   Pt states he always skips dinner due to a desire to lose weight: Dietitian educated pt on this strategy being a possible cause of uncontrolled blood sugars as well as increased meal size the next time he eats.   Dietitian Advised pt to check his blood sugars daily and  when feeling headaches/doziness: re-educated   Pt states he drinks a lot of tea throughout  the day with sugar: Dietitian advise since he does not like splenda he can either go sans sugar or try Monk fruit pt states he is definitely not drinking tea without sugar so he will try Monk fruit    2 eggs tomatoes pita bread for breakfast  Pt is most likely Lactose intolerant.  Pt states he is laying down After lunch which is most likely the cause of his either vomiting or upset stomach unknown even with clarification.   Communication is very difficult.  Pt states he did not tell [place] he was having low blood sugars when asked why he answered he does not know. Pt states he only checks his blood sugars when he does not feel well getting 170, 200. Pt states if he checks his blood sugar and it is below 70 he will eat dark chocolate: for ease of understanding dietitian had him take pictures of the glucose tablets to buy  Pt appeared to be tearful when talking about meals, this provider wonders if an eating disorder may be present which is why he refuses to eat more than 1-2 meals each day but due to communication barriers it is very difficult to pinpoint is that the issue or is it GI upset? Will keep this on providers radar.   Pt smokes a pack of cigarettes a day: interpretor attempted to get pt to make this change Dietitian  advised that recommendation is inappropriate at this time due to other behaviors he needs to work on and his lack of willingness to quit smoking at this time.    Goals: Check your blood sugar every morning fasting (not willing to check twice but might do it once a day) Go for a walk every day after your second meal instead of laying down  Continue Lantus 34 units daily  Continue HUmalog 10 units with each meal  Increase Jardiance 25 mg daily   Diabetes Self-Management Education  Visit Type:    09/27/2021  [name], identified by name and date of birth, is a 46 y.o. male with a diagnosis of Diabetes:  .   ASSESSMENT  There were no vitals taken for  this visit. There is no height or weight on file to calculate BMI.     Individualized Plan for Diabetes Self-Management Training:   Learning Objective:  Patient will have a greater understanding of diabetes self-management. Patient education plan is to attend individual and/or group sessions per assessed needs and concerns.   Plan:  Listed above   Future DSME appointment:

## 2021-11-06 ENCOUNTER — Encounter: Payer: Self-pay | Admitting: Dietician

## 2021-11-06 ENCOUNTER — Ambulatory Visit: Payer: Medicaid Other | Admitting: Medical

## 2021-11-06 ENCOUNTER — Encounter: Payer: Medicaid Other | Attending: Medical | Admitting: Dietician

## 2021-11-06 VITALS — BP 160/80 | HR 50 | Resp 18 | Ht 68.0 in | Wt 252.6 lb

## 2021-11-06 DIAGNOSIS — E785 Hyperlipidemia, unspecified: Secondary | ICD-10-CM

## 2021-11-06 DIAGNOSIS — Z713 Dietary counseling and surveillance: Secondary | ICD-10-CM | POA: Diagnosis not present

## 2021-11-06 DIAGNOSIS — E1065 Type 1 diabetes mellitus with hyperglycemia: Secondary | ICD-10-CM

## 2021-11-06 DIAGNOSIS — I10 Essential (primary) hypertension: Secondary | ICD-10-CM

## 2021-11-06 DIAGNOSIS — M792 Neuralgia and neuritis, unspecified: Secondary | ICD-10-CM | POA: Diagnosis not present

## 2021-11-06 DIAGNOSIS — R809 Proteinuria, unspecified: Secondary | ICD-10-CM | POA: Insufficient documentation

## 2021-11-06 DIAGNOSIS — Z794 Long term (current) use of insulin: Secondary | ICD-10-CM | POA: Insufficient documentation

## 2021-11-06 DIAGNOSIS — G5602 Carpal tunnel syndrome, left upper limb: Secondary | ICD-10-CM

## 2021-11-06 DIAGNOSIS — E1129 Type 2 diabetes mellitus with other diabetic kidney complication: Secondary | ICD-10-CM | POA: Diagnosis not present

## 2021-11-06 DIAGNOSIS — I739 Peripheral vascular disease, unspecified: Secondary | ICD-10-CM

## 2021-11-06 MED ORDER — TADALAFIL 5 MG PO TABS: 5.0000 mg | ORAL_TABLET | Freq: Every day | ORAL | 0 refills | Status: DC | PRN

## 2021-11-06 NOTE — Progress Notes (Signed)
Diabetes Self-Management Education  Visit Type: Follow-up  Appt. Start Time: 1030 Appt. End Time: 1100  11/06/2021  [name], identified by name and date of birth, is a 46 y.o. male with a diagnosis of Diabetes:  .   ASSESSMENT Patient is here today with an interpretor for Arabic.  Thomas [name] from [facility].  He would like to learn to check his blood glucose.   He is having numbness and tingling in his hands. He states that he is very tired after breakfast.  Discussed that he needs to check his blood glucose to make sure it is not too low.  Lantus 34 units q am, Humalog 10 units 15 minutes before eating, Jardiance He is disappointment that Jasrdiance is not helping him lose weight.  Reports not taking the meal time insulin when he skips the meal. 253 lbs today and weight has increased from 241.  Blood glucose was 164 in the office today.  He states that he is not depressed but is crying during his appointment.  He states that he is lonely and does not want to see his friends. He feels used by people. He does not want counseling.  No thoughts of self harm. He has started smoking a lot - 2 packs per day. He complains that his leg has been cold and pain in his left 3 fingers.  He would like an appointment with his PCP.  Called while we had an interpretor and arranged this appointment today. Concerns of disordered eating at times due to patient skipping meals to lose weight.  History includes Type 2 Diabetes (2008), HTN, dyslipidemia, PVD, PVD, depression, dentures Smokes, eats 1-2 meals per day. Labs:  04/27/2021 A1C 8%, 11/06/2020:  Cholesterol 191, HDL 36, LDL 113, Triglycerides 219, A1c 8.6%, GFR 92 241 lbs 11/2020   Medication includes Lantus 34 units each am, Humalog 10 units before each meal. Patient lives alone.  He is from [place]. Patient states that he has not worked since 2020 due to health and injuries.  He states that not working has caused him depression.  He does  feel alone as his friends had to leave as he cannot support them. He is not on disability. He does receive food stamps. Weight 253 lb (114.8 kg). Body mass index is 38.47 kg/m.   Diabetes Self-Management Education - 11/06/21 1154       Visit Information   Visit Type Follow-up      Psychosocial Assessment   What is the hardest part about your diabetes right now, causing you the most concern, or is the most worrisome to you about your diabetes?   Making healty food and beverage choices    Self-care barriers English as a second language    Self-management support Doctor's office    Other persons present Patient    Patient Concerns Nutrition/Meal planning    Special Needs Simplified materials      Pre-Education Assessment   Patient understands the diabetes disease and treatment process. Comprehends key points    Patient understands incorporating nutritional management into lifestyle. Needs Review    Patient undertands incorporating physical activity into lifestyle. Comprehends key points    Patient understands using medications safely. Needs Review    Patient understands monitoring blood glucose, interpreting and using results Needs Review    Patient understands prevention, detection, and treatment of acute complications. Needs Review    Patient understands prevention, detection, and treatment of chronic complications. Needs Review    Patient understands how to  develop strategies to address psychosocial issues. Needs Review    Patient understands how to develop strategies to promote health/change behavior. Needs Review      Complications   How often do you check your blood sugar? 1-2 times/day    Fasting Blood glucose range (mg/dL) 30-160;109-323      Dietary Intake   Breakfast SKIPS or egg, salad    Lunch chicken, rice, salad   3-4 pm   Dinner often skips    Snack (evening) none    Beverage(s) water, hot tea with 2 tsp sugar, black coffee      Activity / Exercise   Activity /  Exercise Type Light (walking / raking leaves)      Patient Education   Previous Diabetes Education Yes (please comment)   09/2021   Healthy Eating Other (comment)   importance of more consistent meals, add carbs to breakfast   Being Active Role of exercise on diabetes management, blood pressure control and cardiac health.    Medications Reviewed patients medication for diabetes, action, purpose, timing of dose and side effects.    Monitoring Taught/evaluated SMBG meter.    Acute complications Taught prevention, symptoms, and  treatment of hypoglycemia - the 15 rule.      Individualized Goals (developed by patient)   Nutrition General guidelines for healthy choices and portions discussed    Physical Activity Exercise 5-7 days per week;30 minutes per day    Medications take my medication as prescribed    Monitoring  Test my blood glucose as discussed    Problem Solving Eating Pattern;Addressing barriers to behavior change    Reducing Risk treat hypoglycemia with 15 grams of carbs if blood glucose less than 70mg /dL;stop smoking;do foot checks daily      Patient Self-Evaluation of Goals - Patient rates self as meeting previously set goals (% of time)   Nutrition 25 - 50% (sometimes)    Physical Activity 25 - 50% (sometimes)    Medications 50 - 75 % (half of the time)    Monitoring 50 - 75 % (half of the time)    Problem Solving and behavior change strategies  < 25% (hardly ever/never)    Reducing Risk (treating acute and chronic complications) < 25% (hardly ever/never)    Health Coping < 25% (hardly ever/never)      Post-Education Assessment   Patient understands the diabetes disease and treatment process. Comprehends key points    Patient understands incorporating nutritional management into lifestyle. Needs Review    Patient undertands incorporating physical activity into lifestyle. Comprehends key points    Patient understands using medications safely. Needs Review    Patient  understands monitoring blood glucose, interpreting and using results Needs Review    Patient understands prevention, detection, and treatment of acute complications. Needs Review    Patient understands prevention, detection, and treatment of chronic complications. Needs Review    Patient understands how to develop strategies to address psychosocial issues. Needs Review    Patient understands how to develop strategies to promote health/change behavior. Needs Review      Outcomes   Expected Outcomes Demonstrated interest in learning but significant barriers to change    Future DMSE 2 months    Program Status Not Completed      Subsequent Visit   Since your last visit have you continued or begun to take your medications as prescribed? No    Since your last visit have you experienced any weight changes? Gain    Since  your last visit, are you checking your blood glucose at least once a day? Yes             Individualized Plan for Diabetes Self-Management Training:   Learning Objective:  Patient will have a greater understanding of diabetes self-management. Patient education plan is to attend individual and/or group sessions per assessed needs and concerns.   Plan:   Patient Instructions  Stay active most days.  Go to the park and exercise outside when you can Get sunshine daily Do something everyday that brings you joy!   Work on reducing/quitting smoking. Reduce the sugar in your tea.       Expected Outcomes:  Demonstrated interest in learning but significant barriers to change  Education material provided:   If problems or questions, patient to contact team via:  Phone  Future DSME appointment: 2 months

## 2021-11-06 NOTE — Patient Instructions (Addendum)
Htn- bp is moderate elevated today. On lisinopril 20 mg daily but did not take bp today. Please take when you get home.   For diabetes- continue with med rx by Dr. [name]. Lantus, humalog and jardiance.  For ED pt states one of his specialist did prescribe. Explained contraindications. Pt not on nitroglycerine.  Rt lower ext cramping with intermittent hot and cold sensation think vascular issue and not neurologic. Follow recommendations of vascular MD.  "I explained he may continue to improve his collateral flow by continued to walk and increase his tolerance over time.             He will f/u with repeat studies in"  Placed referral back to vacular surgeon. If any upper calf pain or near back of knee notify me. In that event get rt lower ext [place].  Left upper extremity chronic tingling and numbness since 2010 to left 3th, 4th and 5th digit. Since chronic and normal movement will refer to neurologist for emg study. During the interim use wrist cock up splint daily.  If with use of splint pending referral to neurologist then get xray of cervical spine as hand symptoms could be from neck etiology. Can use tylenol otc every 8 hours. Since using plavix nsaid not recommended.   Pt want to lose weight. Would recommend you ask your endocrinologist directly if she would rx ozempic and modify other diabetic meds as well.   Follow up in 1 month or sooner.

## 2021-11-06 NOTE — Patient Instructions (Addendum)
Stay active most days.  Go to the park and exercise outside when you can Get sunshine daily Do something everyday that brings you joy!   Work on reducing/quitting smoking. Reduce the sugar in your tea.

## 2021-11-06 NOTE — Progress Notes (Signed)
Subjective:    Patient ID: Thomas [name], male    DOB: Aug 11, 1975, 46 y.o.   MRN: [id number]  HPI  Pt in for follow up.   Pt visit will be done with male arabic interpretor per pt preference.   Pt tells me he has intermittent hot and cold sensation to his rt lower extremity. No weakness or motor deficits.   At times his rt calf will cramp.   "ASSESSMENT/PLAN:  PAD  status post bilateral staged percutaneous revascularization He states he can ambulate longer than before intervention and his right toe wounds are healed. B ABI's show decreased index. Mid left SFA has 50-74% stenosis with triphasic flow, the tibials have monophasic flow.   I explained he may continue to improve his collateral flow by continued to walk and increase his tolerance over time.               He will f/u with repeat studies in"   Pt also has ED. He wants refill of cialis.  Also has chronic left finger tingling.numbness. He had this since 2022 per pt . He does not report any neck pain.  No acute gross motor or sensory function deficits. He states hand tingling since rt foot surgery.Hand symptoms are worse at night.     Review of Systems  Constitutional:  Negative for chills, fatigue and fever.  Respiratory:  Negative for cough, chest tightness, shortness of breath and wheezing.   Cardiovascular:  Negative for chest pain and palpitations.  Gastrointestinal:  Negative for abdominal pain.  Musculoskeletal:  Negative for back pain and gait problem.       Rt leg sensation of hot and cold at times. Some cramping.  See left upper extremity on hop.  Skin:  Negative for rash and wound.  Hematological:  Negative for adenopathy.  Psychiatric/Behavioral:  Negative for behavioral problems.     Past Medical History:  Diagnosis Date   Depression 01/27/2015   Diabetes mellitus without complication ([place])    Hyperlipidemia    Hypertension    Peripheral vascular disease ([place])      Social History    Socioeconomic History   Marital status: Single    Spouse name: Not on file   Number of children: Not on file   Years of education: Not on file   Highest education level: Not on file  Occupational History   Occupation: Unemployed  Tobacco Use   Smoking status: Every Day    Packs/day: 1.00    Types: Cigarettes   Smokeless tobacco: Former   Tobacco comments:    5 cigarrettes a day  Vaping Use   Vaping Use: Never used  Substance and Sexual Activity   Alcohol use: No    Alcohol/week: 0.0 standard drinks of alcohol   Drug use: No   Sexual activity: Not on file  Other Topics Concern   Not on file  Social History Narrative   Not on file   Social Determinants of Health   Financial Resource Strain: Not on file  Food Insecurity: Not on file  Transportation Needs: Not on file  Physical Activity: Not on file  Stress: Not on file  Social Connections: Not on file  Intimate Partner Violence: Not on file    Past Surgical History:  Procedure Laterality Date   ABDOMINAL AORTOGRAM W/LOWER EXTREMITY Right 04/19/2019   Procedure: ABDOMINAL AORTOGRAM W/LOWER EXTREMITY;  Surgeon: Marty Heck, MD;  Location: [place] [facility];  Service: Cardiovascular;  Laterality: Right;  ABDOMINAL AORTOGRAM W/LOWER EXTREMITY Right 09/16/2019   Procedure: ABDOMINAL AORTOGRAM W/LOWER EXTREMITY;  Surgeon: [name], MD;  Location: [facility] INVASIVE CV LAB;  Service: Cardiovascular;  Laterality: Right;   ABDOMINAL AORTOGRAM W/LOWER EXTREMITY N/A 08/17/2020   Procedure: ABDOMINAL AORTOGRAM W/LOWER EXTREMITY;  Surgeon: [name], MD;  Location: [facility];  Service: Cardiovascular;  Laterality: N/A;   FEMORAL-TIBIAL BYPASS GRAFT Right 04/22/2019   Procedure: Right  FEMORAL-TIBIAL ARTERY BYPASS GRAFT with Harvest of Saphenous vein RIGHT SUPERFICIAL FEMORAL ARTERY TO TIBIOPERONEAL TRUNK BYPASS;  Surgeon: [name], MD;  Location: [facility];  Service: Vascular;  Laterality: Right;    IR CERVICAL/THORACIC DISC ASPIRATION W/IMAG GUIDE  04/15/2019   IR FLUORO GUIDED NEEDLE PLC ASPIRATION/INJECTION LOC  04/15/2019   kidney stones  2009   PERIPHERAL VASCULAR BALLOON ANGIOPLASTY Right 09/16/2019   Procedure: PERIPHERAL VASCULAR BALLOON ANGIOPLASTY;  Surgeon: [name], MD;  Location: [facility];  Service: Cardiovascular;  Laterality: Right;  ext iliac DCB   PERIPHERAL VASCULAR BALLOON ANGIOPLASTY  08/17/2020   Procedure: PERIPHERAL VASCULAR BALLOON ANGIOPLASTY;  Surgeon: [name], MD;  Location: [facility];  Service: Cardiovascular;;  Right TP trunk   PERIPHERAL VASCULAR BALLOON ANGIOPLASTY Left 08/24/2020   Procedure: PERIPHERAL VASCULAR BALLOON ANGIOPLASTY;  Surgeon: [name], MD;  Location: [facility];  Service: Cardiovascular;  Laterality: Left;  Tibioperoneal trunk and superficial femoral arteries.   PERIPHERAL VASCULAR INTERVENTION  04/19/2019   Procedure: PERIPHERAL VASCULAR INTERVENTION;  Surgeon: [name], MD;  Location: [facility] INVASIVE [facility];  Service: Cardiovascular;;  right external iliac   TEE WITHOUT CARDIOVERSION N/A 04/19/2019   Procedure: TRANSESOPHAGEAL ECHOCARDIOGRAM (TEE);  Surgeon: [name], MD;  Location: [facility];  Service: Endoscopy;  Laterality: N/A;    Family History  Problem Relation Age of Onset   Colon cancer Neg Hx    Esophageal cancer Neg Hx    Pancreatic cancer Neg Hx    Liver cancer Neg Hx    Stomach cancer Neg Hx     No Known Allergies  Current Outpatient Medications on File Prior to Visit  Medication Sig Dispense Refill   acetaminophen (TYLENOL) 500 MG tablet Take 500 mg by mouth every 6 (six) hours as needed.     aspirin EC 81 MG tablet Take 81 mg by mouth daily. Swallow whole.     atorvastatin (LIPITOR) 20 MG tablet Take 1 tablet (20 mg total) by mouth daily. 90 tablet 3   clopidogrel (PLAVIX) 75 MG tablet TAKE ONE TABLET BY MOUTH DAILY WITH BREAKFAST 90  tablet 2   empagliflozin (JARDIANCE) 25 MG TABS tablet Take 1 tablet (25 mg total) by mouth daily before breakfast. 90 tablet 3   Heating Pads PADS Use over the counter heating pads as needed for back pain 1 Units 5   insulin glargine (LANTUS SOLOSTAR) 100 UNIT/ML Solostar Pen Inject 34 Units into the skin daily. 30 mL 3   insulin lispro (HUMALOG KWIKPEN) 100 UNIT/ML KwikPen Inject 10 Units into the skin 3 (three) times daily. 30 mL 3   Insulin Pen Needle 29G X MISC 1 Device by Does not apply route in the morning, at noon, in the evening, and at bedtime. 400 each 3   Insulin Pen Needle 31G X 5 MM MISC 1 Device by Does not apply route 4 (four) times daily. 100 each 2   lisinopril (ZESTRIL) 20 MG tablet TAKE ONE TABLET BY MOUTH DAILY  90 tablet 3   Omega-3 Fatty Acids (OMEGA-3 EPA FISH OIL PO) Take 2,000 Units by mouth daily.     omeprazole (PRILOSEC) 20 MG capsule TAKE ONE CAPSULE BY MOUTH DAILY 90 capsule 0   OVER THE COUNTER MEDICATION [employer] Ultra Sugar aid  Beet gummies  Arginine and Citrulline     No current facility-administered medications on file prior to visit.    BP (!) 160/80   Pulse (!) 50   Resp 18   Ht 5\' 8"  (1.727 m)   Wt 252 lb 9.6 oz (114.6 kg)   SpO2 92%   BMI 38.41 kg/m        Objective:   Physical Exam  General Mental Status- Alert. General Appearance- Not in acute distress.   Skin General: Color- Normal Color. Moisture- Normal Moisture.  Neck Carotid Arteries- Normal color. Moisture- Normal Moisture. No carotid bruits. No JVD.  Chest and Lung Exam Auscultation: Breath Sounds:-Normal.  Cardiovascular Auscultation:Rythm- Regular. Murmurs & Other Heart Sounds:Auscultation of the heart reveals- No Murmurs.  Abdomen Inspection:-Inspeection Normal. Palpation/Percussion:Note:No mass. Palpation and Percussion of the abdomen reveal- Non Tender, Non Distended + BS, no rebound or guarding.   Neurologic Cranial Nerve exam:- CN III-XII  intact(No nystagmus), symmetric smile. Strength:- 5/5 equal and symmetric strength both upper and lower extremities.  With wrist manipulation his 3rd, 4th and 5th digit got numbness.      Assessment & Plan:   Patient Instructions  Htn- bp is moderate elevated today. On lisinopril 20 mg daily but did not take bp today. Please take when you get home.   For diabetes- continue with med rx by Dr. . Lantus, humalog and jardiance.  For ED pt states one of his specialist did prescribe. Explained contraindications. Pt not on nitroglycerine.  Rt lower ext cramping with intermittent hot and cold sensation think vascular issue and not neurologic. Follow recommendations of vascular MD.  "I explained he may continue to improve his collateral flow by continued to walk and increase his tolerance over time.             He will f/u with repeat studies in"  Placed referral back to vacular surgeon. If any upper calf pain or near back of knee notify me. In that event get rt lower ext [name].  Left upper extremity chronic tingling and numbness since 2010 to left 3th, 4th and 5th digit. Since chronic and normal movement will refer to neurologist for emg study. During the interim use wrist cock up splint daily.  If with use of splint pending referral to neurologist then get xray of cervical spine as hand symptoms could be from neck etiology. Can use tylenol otc every 8 hours. Since using plavix nsaid not recommended.   Pt want to lose weight. Would recommend you ask your endocrinologist directly if she would rx ozempic and modify other diabetic meds as well.   Follow up in 1 month or sooner.               2011, PA-C    Time spent with patient today was 60  minutes which consisted of chart revdiew, discussing diagnosis, work up treatment and documentation. Done thru arabic interpretor.

## 2021-11-13 ENCOUNTER — Encounter: Payer: Self-pay | Admitting: Neurology

## 2021-11-26 ENCOUNTER — Other Ambulatory Visit: Payer: Self-pay | Admitting: Medical

## 2021-12-11 ENCOUNTER — Other Ambulatory Visit: Payer: Self-pay

## 2021-12-11 ENCOUNTER — Ambulatory Visit: Payer: Medicaid Other | Admitting: Physician Assistant

## 2021-12-11 ENCOUNTER — Ambulatory Visit (INDEPENDENT_AMBULATORY_CARE_PROVIDER_SITE_OTHER)
Admission: RE | Admit: 2021-12-11 | Discharge: 2021-12-11 | Disposition: A | Discharge status: Home / Self Care | Payer: Medicaid Other | Source: Ambulatory Visit | Attending: Vascular Surgery | Admitting: Vascular Surgery

## 2021-12-11 ENCOUNTER — Ambulatory Visit (HOSPITAL_COMMUNITY)
Admission: RE | Admit: 2021-12-11 | Discharge: 2021-12-11 | Disposition: A | Discharge status: Home / Self Care | Payer: Medicaid Other | Source: Ambulatory Visit | Attending: Vascular Surgery | Admitting: Vascular Surgery

## 2021-12-11 VITALS — BP 158/101 | HR 95 | Temp 98.1°F | Ht 68.0 in | Wt 246.8 lb

## 2021-12-11 DIAGNOSIS — I70213 Atherosclerosis of native arteries of extremities with intermittent claudication, bilateral legs: Secondary | ICD-10-CM | POA: Diagnosis not present

## 2021-12-11 DIAGNOSIS — I739 Peripheral vascular disease, unspecified: Secondary | ICD-10-CM

## 2021-12-11 NOTE — Progress Notes (Signed)
Office Note     CC:  follow up Requesting Provider:  [name], PA-C  HPI: Thomas [name] is a 46 y.o. (1975/09/24) male who presents for surveillance of PAD.  He is status post right PTA and TP trunk angioplasty by Dr. Chestine Spore on 08/17/2020.  He then underwent left SFA angioplasty as well as below the knee tibial intervention on 08/24/2020.  Surgical history also with right external iliac artery stenting as well as right femoral to popliteal bypass.  He continues to have short distance claudication especially of the right lower extremity.  He is on aspirin and Plavix daily.    Past Medical History:  Diagnosis Date   Depression 01/27/2015   Diabetes mellitus without complication ([facility])    Hyperlipidemia    Hypertension    Peripheral vascular disease [facility])     Past Surgical History:  Procedure Laterality Date   ABDOMINAL AORTOGRAM W/LOWER EXTREMITY Right 04/19/2019   Procedure: ABDOMINAL AORTOGRAM W/LOWER EXTREMITY;  Surgeon: [name], MD;  [facility];  Service: Cardiovascular;  Laterality: Right;   ABDOMINAL AORTOGRAM W/LOWER EXTREMITY Right 09/16/2019   Procedure: ABDOMINAL AORTOGRAM W/LOWER EXTREMITY;  Surgeon: [name], MD;  [facility]: [facility];  Service: Cardiovascular;  Laterality: Right;   ABDOMINAL AORTOGRAM W/LOWER EXTREMITY N/A 08/17/2020   Procedure: ABDOMINAL AORTOGRAM W/LOWER EXTREMITY;  Surgeon: [name], MD;  Location[facility];  Service: Cardiovascular;  Laterality: N/A;   FEMORAL-TIBIAL BYPASS GRAFT Right 04/22/2019   Procedure: Right  FEMORAL-TIBIAL ARTERY BYPASS GRAFT with Harvest of Saphenous vein RIGHT SUPERFICIAL FEMORAL ARTERY TO TIBIOPERONEAL TRUNK BYPASS;  Surgeon: [name], MD;  Location: [facility];  Service: Vascular;  Laterality: Right;   IR CERVICAL/THORACIC DISC ASPIRATION W/IMAG GUIDE  04/15/2019   IR FLUORO GUIDED NEEDLE PLC ASPIRATION/INJECTION LOC  04/15/2019   kidney stones   2009   PERIPHERAL VASCULAR BALLOON ANGIOPLASTY Right 09/16/2019   Procedure: PERIPHERAL VASCULAR BALLOON ANGIOPLASTY;  Surgeon: [name], MD;  [facility];  Service: Cardiovascular;  Laterality: Right;  ext iliac DCB   PERIPHERAL VASCULAR BALLOON ANGIOPLASTY  08/17/2020   Procedure: PERIPHERAL VASCULAR BALLOON ANGIOPLASTY;  Surgeon: [name], MD;  [facility];  Service: Cardiovascular;;  Right TP trunk   PERIPHERAL VASCULAR BALLOON ANGIOPLASTY Left 08/24/2020   Procedure: PERIPHERAL VASCULAR BALLOON ANGIOPLASTY;  Surgeon: [name], MD;  Location: [facility];  Service: Cardiovascular;  Laterality: Left;  Tibioperoneal trunk and superficial femoral arteries.   PERIPHERAL VASCULAR INTERVENTION  04/19/2019   Procedure: PERIPHERAL VASCULAR INTERVENTION;  Surgeon: [name], MD;  Location[facility] INVASIVE [facility] LAB;  Service: Cardiovascular;;  right external iliac   TEE WITHOUT CARDIOVERSION N/A 04/19/2019   Procedure: TRANSESOPHAGEAL ECHOCARDIOGRAM (TEE);  Surgeon: [name], MD;  Location: [facility] ENDOSCOPY;  Service: Endoscopy;  Laterality: N/A;    Social History   Socioeconomic History   Marital status: Single    Spouse name: Not on file   Number of children: Not on file   Years of education: Not on file   Highest education level: Not on file  Occupational History   Occupation: Unemployed  Tobacco Use   Smoking status: Every Day    Packs/day: 0.25    Types: Cigarettes   Smokeless tobacco: Former   Tobacco comments:    5 cigarrettes a day  Vaping Use   Vaping Use: Never used  Substance and Sexual Activity   Alcohol  use: No    Alcohol/week: 0.0 standard drinks of alcohol   Drug use: No   Sexual activity: Not on file  Other Topics Concern   Not on file  Social History Narrative   Not on file   Social Determinants of Health   Financial Resource Strain: Not on file  Food Insecurity: Not on file   Transportation Needs: Not on file  Physical Activity: Not on file  Stress: Not on file  Social Connections: Not on file  Intimate Partner Violence: Not on file    Family History  Problem Relation Age of Onset   Colon cancer Neg Hx    Esophageal cancer Neg Hx    Pancreatic cancer Neg Hx    Liver cancer Neg Hx    Stomach cancer Neg Hx     Current Outpatient Medications  Medication Sig Dispense Refill   acetaminophen (TYLENOL) 500 MG tablet Take 500 mg by mouth every 6 (six) hours as needed.     aspirin EC 81 MG tablet Take 81 mg by mouth daily. Swallow whole.     atorvastatin (LIPITOR) 20 MG tablet Take 1 tablet (20 mg total) by mouth daily. 90 tablet 3   clopidogrel (PLAVIX) 75 MG tablet TAKE ONE TABLET BY MOUTH DAILY WITH BREAKFAST 90 tablet 2   empagliflozin (JARDIANCE) 25 MG TABS tablet Take 1 tablet (25 mg total) by mouth daily before breakfast. 90 tablet 3   Heating Pads PADS Use over the counter heating pads as needed for back pain 1 Units 5   insulin glargine (LANTUS SOLOSTAR) 100 UNIT/ML Solostar Pen Inject 34 Units into the skin daily. 30 mL 3   insulin lispro (HUMALOG KWIKPEN) 100 UNIT/ML KwikPen Inject 10 Units into the skin 3 (three) times daily. 30 mL 3   lisinopril (ZESTRIL) 20 MG tablet TAKE ONE TABLET BY MOUTH DAILY 90 tablet 3   Omega-3 Fatty Acids (OMEGA-3 EPA FISH OIL PO) Take 2,000 Units by mouth daily.     omeprazole (PRILOSEC) 20 MG capsule TAKE ONE CAPSULE BY MOUTH DAILY 90 capsule 0   tadalafil (CIALIS) 5 MG tablet Take 1 tablet (5 mg total) by mouth daily as needed for erectile dysfunction. 30 tablet 0   No current facility-administered medications for this visit.    No Known Allergies   REVIEW OF SYSTEMS:   [X]  denotes positive finding, [ ]  denotes negative finding Cardiac  Comments:  Chest pain or chest pressure:    Shortness of breath upon exertion:    Short of breath when lying flat:    Irregular heart rhythm:        Vascular    Pain in  calf, thigh, or hip brought on by ambulation:    Pain in feet at night that wakes you up from your sleep:     Blood clot in your veins:    Leg swelling:         Pulmonary    Oxygen at home:    Productive cough:     Wheezing:         Neurologic    Sudden weakness in arms or legs:     Sudden numbness in arms or legs:     Sudden onset of difficulty speaking or slurred speech:    Temporary loss of vision in one eye:     Problems with dizziness:         Gastrointestinal    Blood in stool:     Vomited blood:  Genitourinary    Burning when urinating:     Blood in urine:        Psychiatric    Major depression:         Hematologic    Bleeding problems:    Problems with blood clotting too easily:        Skin    Rashes or ulcers:        Constitutional    Fever or chills:      PHYSICAL EXAMINATION:  Vitals:   12/11/21 1103  BP: (!) 158/101  Pulse: 95  Temp: 98.1 F (36.7 C)  TempSrc: Temporal  SpO2: 98%  Weight: 246 lb 12.8 oz (111.9 kg)  Height: 5\' 8"  (1.727 m)    General:  WDWN in NAD; vital signs documented above Gait: Not observed HENT: WNL, normocephalic Pulmonary: normal non-labored breathing , without Rales, rhonchi,  wheezing Cardiac: regular HR Abdomen: soft, NT, no masses Skin: without rashes Vascular Exam/Pulses: Brisk PT signals bilaterally Extremities: Right second toe discoloration Musculoskeletal: no muscle wasting or atrophy  Neurologic: A&O X 3;  No focal weakness or paresthesias are detected Psychiatric:  The pt has Normal affect.   Non-Invasive Vascular Imaging:   ABI/TBIToday's ABIToday's TBIPrevious ABIPrevious TBI  +-------+-----------+-----------+------------+------------+  Right  0.59       0.36       0.74        0.52          +-------+-----------+-----------+------------+------------+  Left   0.61       0.41       0.66        0.5         Left SFA widely patent Right lower extremity bypass graft patent  however sluggish flow with velocities in the 20s and 30s    ASSESSMENT/PLAN:: 46 y.o. male here for follow up for surveillance of PAD  -Mr. Nadal has a known right lower extremity bypass graft.  He has also had inflow and outflow endovascular procedures in the past.  Today's imaging studies demonstrated a drop in ABI from 0.74-0.59.  He is also having worsening claudication of the right calf.  Duplex demonstrates sluggish flow through the bypass graft with velocities in the 20s and 30s.  Because of these reasons, he may have a threatened bypass graft.  Plan will be for aortogram with right lower extremity runoff and possible intervention.  This will be planned with Dr. 01-22-1969 in the near future.  He will continue his aspirin and Plavix daily.  Case was discussed with the patient and all questions were answered and he agrees to proceed.  An interpreter was used for today's visit.   Chestine Spore, PA-C [facility] [phone]  Clinic MD:   [phone]

## 2021-12-20 ENCOUNTER — Other Ambulatory Visit: Payer: Self-pay

## 2021-12-20 ENCOUNTER — Ambulatory Visit (HOSPITAL_COMMUNITY)
Admission: RE | Admit: 2021-12-20 | Discharge: 2021-12-20 | Disposition: A | Discharge status: Home / Self Care | Payer: Medicaid Other | Attending: Vascular Surgery | Admitting: Vascular Surgery

## 2021-12-20 DIAGNOSIS — Z539 Procedure and treatment not carried out, unspecified reason: Secondary | ICD-10-CM | POA: Insufficient documentation

## 2021-12-20 DIAGNOSIS — I70211 Atherosclerosis of native arteries of extremities with intermittent claudication, right leg: Secondary | ICD-10-CM | POA: Insufficient documentation

## 2021-12-20 DIAGNOSIS — Z7902 Long term (current) use of antithrombotics/antiplatelets: Secondary | ICD-10-CM | POA: Diagnosis not present

## 2021-12-20 DIAGNOSIS — E1151 Type 2 diabetes mellitus with diabetic peripheral angiopathy without gangrene: Secondary | ICD-10-CM | POA: Diagnosis not present

## 2021-12-20 DIAGNOSIS — I739 Peripheral vascular disease, unspecified: Secondary | ICD-10-CM

## 2021-12-20 DIAGNOSIS — Z7982 Long term (current) use of aspirin: Secondary | ICD-10-CM | POA: Diagnosis not present

## 2021-12-20 LAB — POCT I-STAT, CHEM 8
BUN: 31 mg/dL — ABNORMAL HIGH (ref 6–20)
Calcium, Ion: 1.2 mmol/L (ref 1.15–1.40)
Chloride: 105 mmol/L (ref 98–111)
Creatinine, Ser: 1.9 mg/dL — ABNORMAL HIGH (ref 0.61–1.24)
Glucose, Bld: 144 mg/dL — ABNORMAL HIGH (ref 70–99)
HCT: 47 % (ref 39.0–52.0)
Hemoglobin: 16 g/dL (ref 13.0–17.0)
Potassium: 4 mmol/L (ref 3.5–5.1)
Sodium: 140 mmol/L (ref 135–145)
TCO2: 23 mmol/L (ref 22–32)

## 2021-12-20 MED ORDER — SODIUM CHLORIDE 0.9 % IV BOLUS: 1000.0000 mL | Freq: Once | INTRAVENOUS | Status: DC

## 2021-12-20 MED ORDER — SODIUM CHLORIDE 0.9 % IV SOLN: INTRAVENOUS | Status: DC

## 2021-12-20 NOTE — H&P (Signed)
History and Physical Interval Note:  12/20/2021 10:46 AM  Thomas [name]  has presented today for surgery, with the diagnosis of pad.  The various methods of treatment have been discussed with the patient and family. After consideration of risks, benefits and other options for treatment, the patient has consented to  Procedure(s): ABDOMINAL AORTOGRAM W/LOWER EXTREMITY (N/A) as a surgical intervention.  The patient's history has been reviewed, patient examined, no change in status, stable for surgery.  I have reviewed the patient's chart and labs.  Questions were answered to the patient's satisfaction.     [name]  Office Note        CC:  follow up Requesting Provider:  [name], PA-C   HPI: Thomas [name] is a 46 y.o. ([birth year]) male who presents for surveillance of PAD.  He is status post right PTA and TP trunk angioplasty by Dr. Chestine Spore on 08/17/2020.  He then underwent left SFA angioplasty as well as below the knee tibial intervention on 08/24/2020.  Surgical history also with right external iliac artery stenting as well as right femoral to popliteal bypass.  He continues to have short distance claudication especially of the right lower extremity.  He is on aspirin and Plavix daily.           Past Medical History:  Diagnosis Date   Depression 01/27/2015   Diabetes mellitus without complication (HCC)     Hyperlipidemia     Hypertension     Peripheral vascular disease [facility])             Past Surgical History:  Procedure Laterality Date   ABDOMINAL AORTOGRAM W/LOWER EXTREMITY Right 04/19/2019    Procedure: ABDOMINAL AORTOGRAM W/LOWER EXTREMITY;  Surgeon: [name], MD;  [facility]: [facility];  Service: Cardiovascular;  Laterality: Right;   ABDOMINAL AORTOGRAM W/LOWER EXTREMITY Right 09/16/2019    Procedure: ABDOMINAL AORTOGRAM W/LOWER EXTREMITY;  Surgeon: [name], MD;  [facility]: [facility];  Service: Cardiovascular;  Laterality:  Right;   ABDOMINAL AORTOGRAM W/LOWER EXTREMITY N/A 08/17/2020    Procedure: ABDOMINAL AORTOGRAM W/LOWER EXTREMITY;  Surgeon: [name], MD;  [facility]: [facility];  Service: Cardiovascular;  Laterality: N/A;   FEMORAL-TIBIAL BYPASS GRAFT Right 04/22/2019    Procedure: Right  FEMORAL-TIBIAL ARTERY BYPASS GRAFT with Harvest of Saphenous vein RIGHT SUPERFICIAL FEMORAL ARTERY TO TIBIOPERONEAL TRUNK BYPASS;  Surgeon: [name], MD;  Location: [facility] OR;  Service: Vascular;  Laterality: Right;   IR CERVICAL/THORACIC DISC ASPIRATION W/IMAG GUIDE   04/15/2019   IR FLUORO GUIDED NEEDLE PLC ASPIRATION/INJECTION LOC   04/15/2019   kidney stones   2009   PERIPHERAL VASCULAR BALLOON ANGIOPLASTY Right 09/16/2019    Procedure: PERIPHERAL VASCULAR BALLOON ANGIOPLASTY;  Surgeon: [name], MD;  [facility];  Service: Cardiovascular;  Laterality: Right;  ext iliac DCB   PERIPHERAL VASCULAR BALLOON ANGIOPLASTY   08/17/2020    Procedure: PERIPHERAL VASCULAR BALLOON ANGIOPLASTY;  Surgeon: [name], MD;  [facility];  Service: Cardiovascular;;  Right TP trunk   PERIPHERAL VASCULAR BALLOON ANGIOPLASTY Left 08/24/2020    Procedure: PERIPHERAL VASCULAR BALLOON ANGIOPLASTY;  Surgeon: [name], MD;  [facility];  Service: Cardiovascular;  Laterality: Left;  Tibioperoneal trunk and superficial femoral arteries.   PERIPHERAL VASCULAR INTERVENTION   04/19/2019    Procedure: PERIPHERAL VASCULAR INTERVENTION;  Surgeon: [name], MD;  Location: [facility] INVASIVE CV LAB;  Service:  Cardiovascular;;  right external iliac   TEE WITHOUT CARDIOVERSION N/A 04/19/2019    Procedure: TRANSESOPHAGEAL ECHOCARDIOGRAM (TEE);  Surgeon: [name], MD;  Location: [facility] ENDOSCOPY;  Service: Endoscopy;  Laterality: N/A;      Social History         Socioeconomic History   Marital status: Single      Spouse name: Not on file    Number of children: Not on file   Years of education: Not on file   Highest education level: Not on file  Occupational History   Occupation: Unemployed  Tobacco Use   Smoking status: Every Day      Packs/day: 0.25      Types: Cigarettes   Smokeless tobacco: Former   Tobacco comments:      5 cigarrettes a day  Vaping Use   Vaping Use: Never used  Substance and Sexual Activity   Alcohol use: No      Alcohol/week: 0.0 standard drinks of alcohol   Drug use: No   Sexual activity: Not on file  Other Topics Concern   Not on file  Social History Narrative   Not on file    Social Determinants of Health    Financial Resource Strain: Not on file  Food Insecurity: Not on file  Transportation Needs: Not on file  Physical Activity: Not on file  Stress: Not on file  Social Connections: Not on file  Intimate Partner Violence: Not on file           Family History  Problem Relation Age of Onset   Colon cancer Neg Hx     Esophageal cancer Neg Hx     Pancreatic cancer Neg Hx     Liver cancer Neg Hx     Stomach cancer Neg Hx              Current Outpatient Medications  Medication Sig Dispense Refill   acetaminophen (TYLENOL) 500 MG tablet Take 500 mg by mouth every 6 (six) hours as needed.       aspirin EC 81 MG tablet Take 81 mg by mouth daily. Swallow whole.       atorvastatin (LIPITOR) 20 MG tablet Take 1 tablet (20 mg total) by mouth daily. 90 tablet 3   clopidogrel (PLAVIX) 75 MG tablet TAKE ONE TABLET BY MOUTH DAILY WITH BREAKFAST 90 tablet 2   empagliflozin (JARDIANCE) 25 MG TABS tablet Take 1 tablet (25 mg total) by mouth daily before breakfast. 90 tablet 3   Heating Pads PADS Use over the counter heating pads as needed for back pain 1 Units 5   insulin glargine (LANTUS SOLOSTAR) 100 UNIT/ML Solostar Pen Inject 34 Units into the skin daily. 30 mL 3   insulin lispro (HUMALOG KWIKPEN) 100 UNIT/ML KwikPen Inject 10 Units into the skin 3 (three) times daily. 30 mL 3    lisinopril (ZESTRIL) 20 MG tablet TAKE ONE TABLET BY MOUTH DAILY 90 tablet 3   Omega-3 Fatty Acids (OMEGA-3 EPA FISH OIL PO) Take 2,000 Units by mouth daily.       omeprazole (PRILOSEC) 20 MG capsule TAKE ONE CAPSULE BY MOUTH DAILY 90 capsule 0   tadalafil (CIALIS) 5 MG tablet Take 1 tablet (5 mg total) by mouth daily as needed for erectile dysfunction. 30 tablet 0    No current facility-administered medications for this visit.      No Known Allergies     REVIEW OF SYSTEMS:    [X]  denotes positive finding, [ ]   denotes negative finding Cardiac   Comments:  Chest pain or chest pressure:      Shortness of breath upon exertion:      Short of breath when lying flat:      Irregular heart rhythm:             Vascular      Pain in calf, thigh, or hip brought on by ambulation:      Pain in feet at night that wakes you up from your sleep:       Blood clot in your veins:      Leg swelling:              Pulmonary      Oxygen at home:      Productive cough:       Wheezing:              Neurologic      Sudden weakness in arms or legs:       Sudden numbness in arms or legs:       Sudden onset of difficulty speaking or slurred speech:      Temporary loss of vision in one eye:       Problems with dizziness:              Gastrointestinal      Blood in stool:       Vomited blood:              Genitourinary      Burning when urinating:       Blood in urine:             Psychiatric      Major depression:              Hematologic      Bleeding problems:      Problems with blood clotting too easily:             Skin      Rashes or ulcers:             Constitutional      Fever or chills:          PHYSICAL EXAMINATION:      Vitals:    12/11/21 1103  BP: (!) 158/101  Pulse: 95  Temp: 98.1 F (36.7 C)  TempSrc: Temporal  SpO2: 98%  Weight: 246 lb 12.8 oz (111.9 kg)  Height: 5\' 8"  (1.727 m)      General:  WDWN in NAD; vital signs documented above Gait: Not  observed HENT: WNL, normocephalic Pulmonary: normal non-labored breathing , without Rales, rhonchi,  wheezing Cardiac: regular HR Abdomen: soft, NT, no masses Skin: without rashes Vascular Exam/Pulses: Brisk PT signals bilaterally Extremities: Right second toe discoloration Musculoskeletal: no muscle wasting or atrophy       Neurologic: A&O X 3;  No focal weakness or paresthesias are detected Psychiatric:  The pt has Normal affect.     Non-Invasive Vascular Imaging:   ABI/TBIToday's ABIToday's TBIPrevious ABIPrevious TBI  +-------+-----------+-----------+------------+------------+  Right  0.59       0.36       0.74        0.52          +-------+-----------+-----------+------------+------------+  Left   0.61       0.41       0.66        0.5          Left SFA widely patent Right  lower extremity bypass graft patent however sluggish flow with velocities in the 20s and 30s       ASSESSMENT/PLAN:: 46 y.o. male here for follow up for surveillance of PAD   -Mr. Lagrow has a known right lower extremity bypass graft.  He has also had inflow and outflow endovascular procedures in the past.  Today's imaging studies demonstrated a drop in ABI from 0.74-0.59.  He is also having worsening claudication of the right calf.  Duplex demonstrates sluggish flow through the bypass graft with velocities in the 20s and 30s.  Because of these reasons, he may have a threatened bypass graft.  Plan will be for aortogram with right lower extremity runoff and possible intervention.  This will be planned with Dr. Chestine Spore in the near future.  He will continue his aspirin and Plavix daily.  Case was discussed with the patient and all questions were answered and he agrees to proceed.  An interpreter was used for today's visit.     Emilie Rutter, PA-C [facility] [phone]   Clinic MD:   Lenell Antu

## 2021-12-20 NOTE — Progress Notes (Signed)
Patient was scheduled for angiogram today with a focus on the right leg where he has recurrent claudication symptoms and a threatened bypass.  Labs now show evidence of AKI with a creatinine of 1.9.  He has normal renal function at baseline.  I have recommended we delay this until next Thursday given need for contrast to evaluate his tibial runoff.  I will hydrate him with 1 L of normal saline.  I have also asked that he hold his ACE for several days prior to the procedure and ensure that he stays hydrated.  We will check renal function again next Thursday.  He is updated and comfortable with plan.  [name], MD [facility] [place] Office: [phone]   [name]

## 2021-12-21 ENCOUNTER — Encounter (HOSPITAL_COMMUNITY): Payer: Self-pay

## 2021-12-27 ENCOUNTER — Other Ambulatory Visit: Payer: Self-pay

## 2021-12-27 ENCOUNTER — Encounter (HOSPITAL_COMMUNITY)
Admission: RE | Disposition: A | Discharge status: Home / Self Care | Payer: Self-pay | Source: Home / Self Care | Attending: Vascular Surgery

## 2021-12-27 ENCOUNTER — Ambulatory Visit (HOSPITAL_COMMUNITY)
Admission: RE | Admit: 2021-12-27 | Discharge: 2021-12-27 | Disposition: A | Discharge status: Home / Self Care | Payer: Medicaid Other | Attending: Vascular Surgery | Admitting: Vascular Surgery

## 2021-12-27 DIAGNOSIS — Z7902 Long term (current) use of antithrombotics/antiplatelets: Secondary | ICD-10-CM | POA: Insufficient documentation

## 2021-12-27 DIAGNOSIS — E1151 Type 2 diabetes mellitus with diabetic peripheral angiopathy without gangrene: Secondary | ICD-10-CM | POA: Diagnosis present

## 2021-12-27 DIAGNOSIS — Z794 Long term (current) use of insulin: Secondary | ICD-10-CM | POA: Diagnosis not present

## 2021-12-27 DIAGNOSIS — Z7982 Long term (current) use of aspirin: Secondary | ICD-10-CM | POA: Insufficient documentation

## 2021-12-27 DIAGNOSIS — I739 Peripheral vascular disease, unspecified: Secondary | ICD-10-CM

## 2021-12-27 DIAGNOSIS — T82898A Other specified complication of vascular prosthetic devices, implants and grafts, initial encounter: Secondary | ICD-10-CM | POA: Diagnosis not present

## 2021-12-27 DIAGNOSIS — Z7984 Long term (current) use of oral hypoglycemic drugs: Secondary | ICD-10-CM | POA: Diagnosis not present

## 2021-12-27 DIAGNOSIS — I70211 Atherosclerosis of native arteries of extremities with intermittent claudication, right leg: Secondary | ICD-10-CM | POA: Insufficient documentation

## 2021-12-27 DIAGNOSIS — F17210 Nicotine dependence, cigarettes, uncomplicated: Secondary | ICD-10-CM | POA: Diagnosis not present

## 2021-12-27 HISTORY — PX: ABDOMINAL AORTOGRAM W/LOWER EXTREMITY: CATH118223

## 2021-12-27 HISTORY — PX: PERIPHERAL VASCULAR INTERVENTION: CATH118257

## 2021-12-27 HISTORY — PX: PERIPHERAL VASCULAR BALLOON ANGIOPLASTY: CATH118281

## 2021-12-27 LAB — POCT I-STAT, CHEM 8
BUN: 24 mg/dL — ABNORMAL HIGH (ref 6–20)
Calcium, Ion: 1.13 mmol/L — ABNORMAL LOW (ref 1.15–1.40)
Chloride: 106 mmol/L (ref 98–111)
Creatinine, Ser: 1.3 mg/dL — ABNORMAL HIGH (ref 0.61–1.24)
Glucose, Bld: 101 mg/dL — ABNORMAL HIGH (ref 70–99)
HCT: 41 % (ref 39.0–52.0)
Hemoglobin: 13.9 g/dL (ref 13.0–17.0)
Potassium: 3.8 mmol/L (ref 3.5–5.1)
Sodium: 140 mmol/L (ref 135–145)
TCO2: 23 mmol/L (ref 22–32)

## 2021-12-27 LAB — GLUCOSE, CAPILLARY
Glucose-Capillary: 100 mg/dL — ABNORMAL HIGH (ref 70–99)
Glucose-Capillary: 80 mg/dL (ref 70–99)

## 2021-12-27 SURGERY — ABDOMINAL AORTOGRAM W/LOWER EXTREMITY
Anesthesia: LOCAL

## 2021-12-27 MED ORDER — FENTANYL CITRATE (PF) 100 MCG/2ML IJ SOLN
INTRAMUSCULAR | Status: DC | PRN
  Administered 2021-12-27 (×3): 25 ug via INTRAVENOUS

## 2021-12-27 MED ORDER — MIDAZOLAM HCL 2 MG/2ML IJ SOLN
INTRAMUSCULAR | Status: AC
  Filled 2021-12-27: qty 2

## 2021-12-27 MED ORDER — FENTANYL CITRATE (PF) 100 MCG/2ML IJ SOLN
INTRAMUSCULAR | Status: AC
  Filled 2021-12-27: qty 2

## 2021-12-27 MED ORDER — HYDRALAZINE HCL 20 MG/ML IJ SOLN
INTRAMUSCULAR | Status: DC | PRN
  Administered 2021-12-27: 10 mg via INTRAVENOUS

## 2021-12-27 MED ORDER — HEPARIN (PORCINE) IN NACL 1000-0.9 UT/500ML-% IV SOLN
INTRAVENOUS | Status: DC | PRN
  Administered 2021-12-27 (×2): 500 mL

## 2021-12-27 MED ORDER — HYDRALAZINE HCL 20 MG/ML IJ SOLN
INTRAMUSCULAR | Status: AC
  Filled 2021-12-27: qty 1

## 2021-12-27 MED ORDER — CLOPIDOGREL BISULFATE 75 MG PO TABS
ORAL_TABLET | ORAL | Status: AC
  Filled 2021-12-27: qty 1

## 2021-12-27 MED ORDER — CLOPIDOGREL BISULFATE 75 MG PO TABS: 75.0000 mg | ORAL_TABLET | Freq: Every day | ORAL | Status: DC

## 2021-12-27 MED ORDER — LABETALOL HCL 5 MG/ML IV SOLN
10.0000 mg | INTRAVENOUS | Status: DC | PRN
  Administered 2021-12-27: 10 mg via INTRAVENOUS
  Filled 2021-12-27: qty 4

## 2021-12-27 MED ORDER — CLOPIDOGREL BISULFATE 75 MG PO TABS
ORAL_TABLET | ORAL | Status: DC | PRN
  Administered 2021-12-27: 75 mg via ORAL

## 2021-12-27 MED ORDER — SODIUM CHLORIDE 0.9% FLUSH: 3.0000 mL | Freq: Two times a day (BID) | INTRAVENOUS | Status: DC

## 2021-12-27 MED ORDER — LIDOCAINE HCL (PF) 1 % IJ SOLN
INTRAMUSCULAR | Status: DC | PRN
  Administered 2021-12-27: 20 mL

## 2021-12-27 MED ORDER — SODIUM CHLORIDE 0.9% FLUSH: 3.0000 mL | INTRAVENOUS | Status: DC | PRN

## 2021-12-27 MED ORDER — ACETAMINOPHEN 325 MG PO TABS: 650.0000 mg | ORAL_TABLET | ORAL | Status: DC | PRN

## 2021-12-27 MED ORDER — SODIUM CHLORIDE 0.9 % IV SOLN: 250.0000 mL | INTRAVENOUS | Status: DC | PRN

## 2021-12-27 MED ORDER — LIDOCAINE HCL (PF) 1 % IJ SOLN
INTRAMUSCULAR | Status: AC
  Filled 2021-12-27: qty 30

## 2021-12-27 MED ORDER — HEPARIN SODIUM (PORCINE) 1000 UNIT/ML IJ SOLN
INTRAMUSCULAR | Status: DC | PRN
  Administered 2021-12-27: 12000 [IU] via INTRAVENOUS

## 2021-12-27 MED ORDER — HEPARIN (PORCINE) IN NACL 1000-0.9 UT/500ML-% IV SOLN
INTRAVENOUS | Status: AC
  Filled 2021-12-27: qty 1000

## 2021-12-27 MED ORDER — IODIXANOL 320 MG/ML IV SOLN
INTRAVENOUS | Status: DC | PRN
  Administered 2021-12-27: 155 mL

## 2021-12-27 MED ORDER — SODIUM CHLORIDE 0.9 % IV SOLN: INTRAVENOUS | Status: DC

## 2021-12-27 MED ORDER — HEPARIN SODIUM (PORCINE) 1000 UNIT/ML IJ SOLN
INTRAMUSCULAR | Status: AC
  Filled 2021-12-27: qty 10

## 2021-12-27 MED ORDER — MIDAZOLAM HCL 2 MG/2ML IJ SOLN
INTRAMUSCULAR | Status: DC | PRN
  Administered 2021-12-27 (×2): 1 mg via INTRAVENOUS

## 2021-12-27 MED ORDER — ASPIRIN 81 MG PO CHEW
CHEWABLE_TABLET | ORAL | Status: AC
  Filled 2021-12-27: qty 1

## 2021-12-27 MED ORDER — ONDANSETRON HCL 4 MG/2ML IJ SOLN: 4.0000 mg | Freq: Four times a day (QID) | INTRAMUSCULAR | Status: DC | PRN

## 2021-12-27 MED ORDER — ASPIRIN 81 MG PO CHEW
CHEWABLE_TABLET | ORAL | Status: DC | PRN
  Administered 2021-12-27: 81 mg via ORAL

## 2021-12-27 MED ORDER — SODIUM CHLORIDE 0.9 % IV SOLN: INTRAVENOUS | Status: AC

## 2021-12-27 MED ORDER — ASPIRIN 81 MG PO TBEC: 81.0000 mg | DELAYED_RELEASE_TABLET | Freq: Every day | ORAL | Status: DC

## 2021-12-27 MED ORDER — HYDRALAZINE HCL 20 MG/ML IJ SOLN
5.0000 mg | INTRAMUSCULAR | Status: AC | PRN
  Administered 2021-12-27 (×2): 5 mg via INTRAVENOUS

## 2021-12-27 SURGICAL SUPPLY — 25 items
BALLN IN.PACT DCB 4X60 (BALLOONS) ×2
BALLN IN.PACT DCB 5X80 (BALLOONS) ×2
BALLN STERLING OTW 3X100X150 (BALLOONS) ×2
BALLN STERLING OTW 5X80X135 (BALLOONS) ×2
BALLOON STERLING OTW 3X100X150 (BALLOONS) IMPLANT
BALLOON STERLING OTW 5X80X135 (BALLOONS) IMPLANT
CATH 0.018 NAVICROSS ANG 135 (CATHETERS) IMPLANT
CATH OMNI FLUSH 5F 65CM (CATHETERS) IMPLANT
CATH QUICKCROSS SUPP .035X90CM (MICROCATHETER) IMPLANT
DCB IN.PACT 4X60 (BALLOONS) IMPLANT
DCB IN.PACT 5X80 (BALLOONS) IMPLANT
GLIDEWIRE ADV .035X260CM (WIRE) IMPLANT
GLIDEWIRE ANGLED NITR .018X260 (WIRE) IMPLANT
KIT ENCORE 26 ADVANTAGE (KITS) IMPLANT
KIT MICROPUNCTURE NIT STIFF (SHEATH) IMPLANT
KIT PV (KITS) ×2 IMPLANT
SHEATH FLEX ANSEL ANG 6F 45CM (SHEATH) IMPLANT
SHEATH PINNACLE 5F 10CM (SHEATH) IMPLANT
SHEATH PROBE COVER 6X72 (BAG) IMPLANT
STENT ELUVIA 6X80X130 (Permanent Stent) IMPLANT
TRANSDUCER W/STOPCOCK (MISCELLANEOUS) ×2 IMPLANT
TRAY PV CATH (CUSTOM PROCEDURE TRAY) ×2 IMPLANT
WIRE BENTSON .035X145CM (WIRE) IMPLANT
WIRE G V18X300CM (WIRE) IMPLANT
WIRE SPARTACORE .014X300CM (WIRE) IMPLANT

## 2021-12-27 NOTE — Progress Notes (Signed)
Patient ambulated in hall. No bleeding noted after ambulation.

## 2021-12-27 NOTE — Progress Notes (Signed)
SITE AREA: left groin/femoral  SITE PRIOR TO REMOVAL:  LEVEL 0  PRESSURE APPLIED FOR: approximately 20 minutes  MANUAL: yes  PATIENT STATUS DURING PULL: stable, resting with eyes closed  POST PULL SITE:  LEVEL 0  POST PULL INSTRUCTIONS GIVEN: yes  POST PULL PULSES PRESENT: bilateral post tibial pulses dopplerable  DRESSING APPLIED: gauze with tegaderm  BEDREST BEGINS @ 1226  COMMENTS:  interpreter by room, bay 3

## 2021-12-27 NOTE — H&P (Signed)
History and Physical Interval Note:  12/27/2021 7:51 AM  Thomas [name]  has presented today for surgery, with the diagnosis of pad.  The various methods of treatment have been discussed with the patient and family. After consideration of risks, benefits and other options for treatment, the patient has consented to  Procedure(s): ABDOMINAL AORTOGRAM W/LOWER EXTREMITY (N/A) as a surgical intervention.  The patient's history has been reviewed, patient examined, no change in status, stable for surgery.  I have reviewed the patient's chart and labs.  Questions were answered to the patient's satisfaction.     [name]  Office Note        CC:  follow up Requesting Provider:  [name], PA-C   HPI: Thomas [name] is a 46 y.o. (01/06/1976) male who presents for surveillance of PAD.  He is status post right PTA and TP trunk angioplasty by Dr. Chestine Spore on 08/17/2020.  He then underwent left SFA angioplasty as well as below the knee tibial intervention on 08/24/2020.  Surgical history also with right external iliac artery stenting as well as right femoral to popliteal bypass.  He continues to have short distance claudication especially of the right lower extremity.  He is on aspirin and Plavix daily.           Past Medical History:  Diagnosis Date   Depression 01/27/2015   Diabetes mellitus without complication (HCC)     Hyperlipidemia     Hypertension     Peripheral vascular disease [facility])             Past Surgical History:  Procedure Laterality Date   ABDOMINAL AORTOGRAM W/LOWER EXTREMITY Right 04/19/2019    Procedure: ABDOMINAL AORTOGRAM W/LOWER EXTREMITY;  Surgeon: [name], MD;  [facility];  Service: Cardiovascular;  Laterality: Right;   ABDOMINAL AORTOGRAM W/LOWER EXTREMITY Right 09/16/2019    Procedure: ABDOMINAL AORTOGRAM W/LOWER EXTREMITY;  Surgeon: [name], MD;  [facility]: [facility];  Service: Cardiovascular;  Laterality:  Right;   ABDOMINAL AORTOGRAM W/LOWER EXTREMITY N/A 08/17/2020    Procedure: ABDOMINAL AORTOGRAM W/LOWER EXTREMITY;  Surgeon: [name], MD;  [facility]: [facility];  Service: Cardiovascular;  Laterality: N/A;   FEMORAL-TIBIAL BYPASS GRAFT Right 04/22/2019    Procedure: Right  FEMORAL-TIBIAL ARTERY BYPASS GRAFT with Harvest of Saphenous vein RIGHT SUPERFICIAL FEMORAL ARTERY TO TIBIOPERONEAL TRUNK BYPASS;  Surgeon: [name], MD;  Location: [facility] OR;  Service: Vascular;  Laterality: Right;   IR CERVICAL/THORACIC DISC ASPIRATION W/IMAG GUIDE   04/15/2019   IR FLUORO GUIDED NEEDLE PLC ASPIRATION/INJECTION LOC   04/15/2019   kidney stones   2009   PERIPHERAL VASCULAR BALLOON ANGIOPLASTY Right 09/16/2019    Procedure: PERIPHERAL VASCULAR BALLOON ANGIOPLASTY;  Surgeon: [name], MD;  [facility];  Service: Cardiovascular;  Laterality: Right;  ext iliac DCB   PERIPHERAL VASCULAR BALLOON ANGIOPLASTY   08/17/2020    Procedure: PERIPHERAL VASCULAR BALLOON ANGIOPLASTY;  Surgeon: [name], MD;  [facility];  Service: Cardiovascular;;  Right TP trunk   PERIPHERAL VASCULAR BALLOON ANGIOPLASTY Left 08/24/2020    Procedure: PERIPHERAL VASCULAR BALLOON ANGIOPLASTY;  Surgeon: [name], MD;  [facility]: [facility];  Service: Cardiovascular;  Laterality: Left;  Tibioperoneal trunk and superficial femoral arteries.   PERIPHERAL VASCULAR INTERVENTION   04/19/2019    Procedure: PERIPHERAL VASCULAR INTERVENTION;  Surgeon: [name], MD;  Location: [facility] INVASIVE CV LAB;  Service:  Cardiovascular;;  right external iliac   TEE WITHOUT CARDIOVERSION N/A 04/19/2019    Procedure: TRANSESOPHAGEAL ECHOCARDIOGRAM (TEE);  Surgeon: [name], MD;  Location: [facility] ENDOSCOPY;  Service: Endoscopy;  Laterality: N/A;      Social History         Socioeconomic History   Marital status: Single      Spouse name: Not on file    Number of children: Not on file   Years of education: Not on file   Highest education level: Not on file  Occupational History   Occupation: Unemployed  Tobacco Use   Smoking status: Every Day      Packs/day: 0.25      Types: Cigarettes   Smokeless tobacco: Former   Tobacco comments:      5 cigarrettes a day  Vaping Use   Vaping Use: Never used  Substance and Sexual Activity   Alcohol use: No      Alcohol/week: 0.0 standard drinks of alcohol   Drug use: No   Sexual activity: Not on file  Other Topics Concern   Not on file  Social History Narrative   Not on file    Social Determinants of Health    Financial Resource Strain: Not on file  Food Insecurity: Not on file  Transportation Needs: Not on file  Physical Activity: Not on file  Stress: Not on file  Social Connections: Not on file  Intimate Partner Violence: Not on file           Family History  Problem Relation Age of Onset   Colon cancer Neg Hx     Esophageal cancer Neg Hx     Pancreatic cancer Neg Hx     Liver cancer Neg Hx     Stomach cancer Neg Hx              Current Outpatient Medications  Medication Sig Dispense Refill   acetaminophen (TYLENOL) 500 MG tablet Take 500 mg by mouth every 6 (six) hours as needed.       aspirin EC 81 MG tablet Take 81 mg by mouth daily. Swallow whole.       atorvastatin (LIPITOR) 20 MG tablet Take 1 tablet (20 mg total) by mouth daily. 90 tablet 3   clopidogrel (PLAVIX) 75 MG tablet TAKE ONE TABLET BY MOUTH DAILY WITH BREAKFAST 90 tablet 2   empagliflozin (JARDIANCE) 25 MG TABS tablet Take 1 tablet (25 mg total) by mouth daily before breakfast. 90 tablet 3   Heating Pads PADS Use over the counter heating pads as needed for back pain 1 Units 5   insulin glargine (LANTUS SOLOSTAR) 100 UNIT/ML Solostar Pen Inject 34 Units into the skin daily. 30 mL 3   insulin lispro (HUMALOG KWIKPEN) 100 UNIT/ML KwikPen Inject 10 Units into the skin 3 (three) times daily. 30 mL 3    lisinopril (ZESTRIL) 20 MG tablet TAKE ONE TABLET BY MOUTH DAILY 90 tablet 3   Omega-3 Fatty Acids (OMEGA-3 EPA FISH OIL PO) Take 2,000 Units by mouth daily.       omeprazole (PRILOSEC) 20 MG capsule TAKE ONE CAPSULE BY MOUTH DAILY 90 capsule 0   tadalafil (CIALIS) 5 MG tablet Take 1 tablet (5 mg total) by mouth daily as needed for erectile dysfunction. 30 tablet 0    No current facility-administered medications for this visit.      No Known Allergies     REVIEW OF SYSTEMS:    [X]  denotes positive finding, [ ]   denotes negative finding Cardiac   Comments:  Chest pain or chest pressure:      Shortness of breath upon exertion:      Short of breath when lying flat:      Irregular heart rhythm:             Vascular      Pain in calf, thigh, or hip brought on by ambulation:      Pain in feet at night that wakes you up from your sleep:       Blood clot in your veins:      Leg swelling:              Pulmonary      Oxygen at home:      Productive cough:       Wheezing:              Neurologic      Sudden weakness in arms or legs:       Sudden numbness in arms or legs:       Sudden onset of difficulty speaking or slurred speech:      Temporary loss of vision in one eye:       Problems with dizziness:              Gastrointestinal      Blood in stool:       Vomited blood:              Genitourinary      Burning when urinating:       Blood in urine:             Psychiatric      Major depression:              Hematologic      Bleeding problems:      Problems with blood clotting too easily:             Skin      Rashes or ulcers:             Constitutional      Fever or chills:          PHYSICAL EXAMINATION:      Vitals:    12/11/21 1103  BP: (!) 158/101  Pulse: 95  Temp: 98.1 F (36.7 C)  TempSrc: Temporal  SpO2: 98%  Weight: 246 lb 12.8 oz (111.9 kg)  Height: 5\' 8"  (1.727 m)      General:  WDWN in NAD; vital signs documented above Gait: Not  observed HENT: WNL, normocephalic Pulmonary: normal non-labored breathing , without Rales, rhonchi,  wheezing Cardiac: regular HR Abdomen: soft, NT, no masses Skin: without rashes Vascular Exam/Pulses: Brisk PT signals bilaterally Extremities: Right second toe discoloration Musculoskeletal: no muscle wasting or atrophy       Neurologic: A&O X 3;  No focal weakness or paresthesias are detected Psychiatric:  The pt has Normal affect.     Non-Invasive Vascular Imaging:   ABI/TBIToday's ABIToday's TBIPrevious ABIPrevious TBI  +-------+-----------+-----------+------------+------------+  Right  0.59       0.36       0.74        0.52          +-------+-----------+-----------+------------+------------+  Left   0.61       0.41       0.66        0.5          Left SFA widely patent Right  lower extremity bypass graft patent however sluggish flow with velocities in the 20s and 30s       ASSESSMENT/PLAN:: 46 y.o. male here for follow up for surveillance of PAD   -Mr. Schwalb has a known right lower extremity bypass graft.  He has also had inflow and outflow endovascular procedures in the past.  Today's imaging studies demonstrated a drop in ABI from 0.74-0.59.  He is also having worsening claudication of the right calf.  Duplex demonstrates sluggish flow through the bypass graft with velocities in the 20s and 30s.  Because of these reasons, he may have a threatened bypass graft.  Plan will be for aortogram with right lower extremity runoff and possible intervention.  This will be planned with Dr. Chestine Spore in the near future.  He will continue his aspirin and Plavix daily.  Case was discussed with the patient and all questions were answered and he agrees to proceed.  An interpreter was used for today's visit.     Emilie Rutter, PA-C [facility] [phone]   Clinic MD:   Lenell Antu

## 2021-12-27 NOTE — Op Note (Signed)
Patient name: Thomas [name] MRN: [id number] DOB: April 19, 1975 Sex: male  12/27/2021 Pre-operative Diagnosis: Threatened right above-knee popliteal artery to tibioperoneal trunk bypass with saphenous vein Post-operative diagnosis:  Same Surgeon:  [name], MD Procedure Performed: 1.  Ultrasound-guided access left common femoral artery 2.  Aortogram with catheter selection of aorta 3.  Bilateral lower extremity arteriogram with runoff 4.  Right proximal SFA angioplasty with stent placement (initially treated with 4 mm x 60 mm drug-coated impact and then 5 mm x 80 mm drug-coated impact and then stented with a 6 mm x 80 mm Eluvia) 5.  Right TP trunk and posterior tibial artery angioplasty (3 mm x 100 mm Sterling) 6.  101 minutes of monitored moderate conscious sedation time  Indications: 46 year old male previously underwent a right above-knee popliteal artery to TP trunk bypass with reversed ipsilateral saphenous vein for critical limb ischemia with tissue loss.  His wounds all healed years ago.  Subsequently found to have diminished flow in the bypass and presents today for lower extremity arteriogram with possible intervention to maintain long-term patency of the bypass after risk benefits discussed.  Findings:   Aortogram showed no flow-limiting stenosis in the aortoiliac segment.  Both renal arteries were patent.  The right iliac stent was widely patent.  Right lower extremity runoff showed a patent common femoral and profunda.  The SFA is patent but had a high-grade stenosis greater than 80% in the proximal vessel.  The above-knee popliteal artery to TP trunk bypass was widely patent.  He had severe recurrent high-grade stenosis greater than 99% in the TP trunk and proximal PT artery as the dominant runoff.  Ultimately the right SFA was initially treated with two drug-coated balloons and then we had a dissection so this lesion was stented with a 6 mm x 80 mm drug-coated Eluvia  postdilated with a 5 mm balloon with widely patent stent and excellent runoff.  I then got down the TP trunk into the posterior tibial artery and this was angioplastied with a 3 mm Sterling.  Excellent results and now widely patent TP trunk/PT with preserved flow down the PT.  Left lower extremity runoff showed a patent common femoral, profunda, SFA, above and below-knee popliteal artery.  The tibial trifurcation appears occluded.  He does reconstitute a PT and peroneal as dominant runoff.   Procedure:  The patient was identified in the holding area and taken to room 8.  The patient was then placed supine on the table and prepped and draped in the usual sterile fashion.  A time out was called.  The patient received Versed and fentanyl for moderate conscious sedation.  Vital signs were monitored including heart rate, respiratory rate, blood pressure and oxygenation.  I was present for all of sedation.  Ultrasound was used to evaluate the left common femoral artery.  It was patent.  A digital ultrasound image was acquired.  A micropuncture needle was used to access the left common femoral artery under ultrasound guidance.  An 018 wire was advanced without resistance and a micropuncture sheath was placed.  The 018 wire was removed and a benson wire was placed.  The micropuncture sheath was exchanged for a 5 french sheath.  An omniflush catheter was advanced over the wire to the level of L-1.  An abdominal angiogram was obtained.  Next the catheter was pulled down and bilateral lower extremity runoff was obtained.  Pertinent findings are noted above.  Ultimately we elected for intervention on the right  lower extremity.  The Omni Flush catheter with a Glidewire advantage was used to cross the aortic bifurcation and got a wire into the right SFA.  Patient was given 100 units/kg IV heparin.  Then used a long 6 [place] Ansell sheath in the left groin over the aortic bifurcation.  Right SFA lesion was then crossed  antegrade with a Glidewire advantage and then elected to treat this with a drug-coated balloon using a 4 mm x 60 mm drug-coated impact and then a 5 mm x 80 mm drug-coated impact.  Unfortunate there was a dissection and I elected to place a stent with a 6 mm x 80 mm Eluvia postdilated with a 5 mm balloon with excellent results and preserved runoff.  I then went down the bypass with a V18 and got across the high-grade stenosis in the TP trunk and my wire kept wanting to go down the peroneal.  I used multiple wires and catheters and finally got down the PT as the dominant runoff in the lower leg.  The TP trunk and PT artery was then angioplastied with a 3 mm x 100 mm Sterling to nominal pressure for 2 minutes.  Excellent results.  Preserved runoff.  No residual stenosis.  Wires and catheters were removed.  Taken holding to have the sheath removed.  Plan: Patient is optimized from vascular surgery standpoint after intervention on the right lower extremity.  Needs to continue aspirin statin Plavix.  Needs to stop smoking.  We will see him in 1 month with non-invasive imaging in the office.   [name], MD [facility] Office: [phone]

## 2021-12-28 ENCOUNTER — Encounter (HOSPITAL_COMMUNITY): Payer: Self-pay | Admitting: Vascular Surgery

## 2021-12-28 LAB — POCT ACTIVATED CLOTTING TIME
Activated Clotting Time: 227 seconds
Activated Clotting Time: 185 seconds

## 2021-12-31 ENCOUNTER — Telehealth: Payer: Self-pay | Admitting: Vascular Surgery

## 2021-12-31 NOTE — Telephone Encounter (Signed)
Appt scheduled

## 2021-12-31 NOTE — Telephone Encounter (Signed)
-----   Message from Marty Heck, MD sent at 12/27/2021 11:34 AM EDT ----- Patient name: Thomas [name]      MRN: [id number]        DOB: 1975-05-07          Sex: male  12/27/2021 Pre-operative Diagnosis: Threatened right above-knee popliteal artery to tibioperoneal trunk bypass with saphenous vein Post-operative diagnosis:  Same Surgeon:  Marty Heck, MD Procedure Performed: 1.  Ultrasound-guided access left common femoral artery 2.  Aortogram with catheter selection of aorta 3.  Bilateral lower extremity arteriogram with runoff 4.  Right proximal SFA angioplasty with stent placement (initially treated with 4 mm x 60 mm drug-coated impact and then 5 mm x 80 mm drug-coated impact and then stented with a 6 mm x 80 mm Eluvia) 5.  Right TP trunk and posterior tibial artery angioplasty (3 mm x 100 mm Sterling) 6.  101 minutes of monitored moderate conscious sedation time  #Can you arrange follow-up in the office in one month with PA clinic?  Right leg arterial duplex and ABI.  Thanks,  Gerald Stabs

## 2022-01-07 ENCOUNTER — Telehealth: Payer: Self-pay | Admitting: Medical

## 2022-01-07 MED ORDER — INSULIN LISPRO (1 UNIT DIAL) 100 UNIT/ML (KWIKPEN): 10.0000 [IU] | PEN_INJECTOR | Freq: Three times a day (TID) | SUBCUTANEOUS | 3 refills | Status: DC

## 2022-01-07 NOTE — Telephone Encounter (Signed)
Einar Gip, called back to advise that patient uses [facility]. [place], #[phone]

## 2022-01-07 NOTE — Telephone Encounter (Signed)
Rx sent to health department 

## 2022-01-07 NOTE — Telephone Encounter (Signed)
Medication: insulin lispro (HUMALOG KWIKPEN) 100 UNIT/ML KwikPen   Has the patient contacted their pharmacy? Yes.     Preferred Pharmacy (with phone number or street name):  [facility] [id number] - [place], [place] - [address]  [address], [place] [address]  Phone:  [phone]  Fax:  [phone]     Please let patient know when sent. He is running out.    Agent: Please be advised that RX refills may take up to 3 business days. We ask that you follow-up with your pharmacy.

## 2022-01-10 ENCOUNTER — Encounter: Payer: Medicaid Other | Attending: Medical | Admitting: Dietician

## 2022-01-10 ENCOUNTER — Encounter: Payer: Self-pay | Admitting: Dietician

## 2022-01-10 DIAGNOSIS — Z794 Long term (current) use of insulin: Secondary | ICD-10-CM | POA: Insufficient documentation

## 2022-01-10 DIAGNOSIS — E1159 Type 2 diabetes mellitus with other circulatory complications: Secondary | ICD-10-CM | POA: Diagnosis not present

## 2022-01-10 NOTE — Progress Notes (Addendum)
Diabetes Self-Management Education  Visit Type: Follow-up  Appt. Start Time: 1000 Appt. End Time: 1030  01/11/2022  [name], identified by name and date of birth, is a 46 y.o. male with a diagnosis of Diabetes:  .   ASSESSMENT Patient is here today with his interpretor from [place], Thomas [name] (arabic).  He was last seen by this dietitian on 11/06/2021.  Fasting glucose 120-150 Quit smoking Walks 30-60 minutes daily. Goes to [employer] daily English as a second language.  He is understanding more [place]. Mood is more positive. He has lost 9 lbs in the past 2 months.  Concerns in the past of disordered eating at times due to patient skipping meals to lose weight.   History includes Type 2 Diabetes (2008), HTN, dyslipidemia, PVD, PVD, depression, dentures Smokes, eats 1-2 meals per day. Labs: A1C 7.5% 08/31/2021 decreased from 04/27/2021 A1C 8%, 11/06/2020:  Cholesterol 191, HDL 36, LDL 113, Triglycerides 219, A1c 8.6%, GFR 92 241 lbs 11/2020 Medication includes Lantus 34 units each am, Humalog 10 units before each meal, Jardiance  Patient lives alone.  He is from [place]. Patient states that he has not worked since 2020 due to health and injuries.  He states that not working has caused him depression.  He does feel alone as his friends had to leave as he cannot support them. He is not on disability. He does receive food stamps.  Weight 245 lb (111.1 kg). Body mass index is 37.25 kg/m.   Diabetes Self-Management Education - 01/10/22 1100       Visit Information   Visit Type Follow-up      Psychosocial Assessment   What is the hardest part about your diabetes right now, causing you the most concern, or is the most worrisome to you about your diabetes?   Getting support / problem solving    Self-care barriers English as a second language    Self-management support Doctor's office;CDE visits    Other persons present Patient;Interpreter    Patient Concerns Nutrition/Meal planning       Pre-Education Assessment   Patient understands the diabetes disease and treatment process. Comprehends key points    Patient understands incorporating nutritional management into lifestyle. Comprehends key points    Patient undertands incorporating physical activity into lifestyle. Comprehends key points    Patient understands using medications safely. Comprehends key points    Patient understands monitoring blood glucose, interpreting and using results Comprehends key points    Patient understands prevention, detection, and treatment of acute complications. Comprehends key points    Patient understands prevention, detection, and treatment of chronic complications. Compreheands key points    Patient understands how to develop strategies to address psychosocial issues. Comprehends key points    Patient understands how to develop strategies to promote health/change behavior. Comprehends key points      Complications   Last HgB A1C per patient/outside source 7.5 %   08/2021 decreased from 8% 04/2021   How often do you check your blood sugar? 1-2 times/day    Fasting Blood glucose range (mg/dL) 74-081;448-185   631-497     Dietary Intake   Breakfast 2eggs, [employer] bread, occasional tomatoes, hot tea with 2 tsp sugar    Dinner fish or chicken, rice or [employer] bread, salad OR potato soup   4 pm   Beverage(s) water, 1 cup hot tea with 2 tsp sugar      Activity / Exercise   Activity / Exercise Type Light (walking / raking leaves)  How many days per week do you exercise? 7    How many minutes per day do you exercise? 45    Total minutes per week of exercise 315      Patient Education   Previous Diabetes Education Yes (please comment)   08/2021   Healthy Eating Role of diet in the treatment of diabetes and the relationship between the three main macronutrients and blood glucose level    Being Active Role of exercise on diabetes management, blood pressure control and cardiac health.    Medications  Reviewed patients medication for diabetes, action, purpose, timing of dose and side effects.    Monitoring Taught/evaluated SMBG meter.   discussed newest Dexcom G7.  Patient not interested at this time due to problems with the G6   Diabetes Stress and Support Identified and addressed patients feelings and concerns about diabetes;Role of stress on diabetes      Individualized Goals (developed by patient)   Nutrition General guidelines for healthy choices and portions discussed    Physical Activity Exercise 5-7 days per week;45 minutes per day    Medications take my medication as prescribed    Monitoring  Test my blood glucose as discussed    Problem Solving Eating Pattern    Reducing Risk examine blood glucose patterns    Health Coping Discuss barriers to diabetes care with support person/system (comment specifics as needed)      Patient Self-Evaluation of Goals - Patient rates self as meeting previously set goals (% of time)   Nutrition 50 - 75 % (half of the time)    Physical Activity >75% (most of the time)    Medications >75% (most of the time)    Monitoring >75% (most of the time)    Problem Solving and behavior change strategies  50 - 75 % (half of the time)    Reducing Risk (treating acute and chronic complications) >75% (most of the time)    Health Coping 50 - 75 % (half of the time)      Post-Education Assessment   Patient understands the diabetes disease and treatment process. Comprehends key points    Patient understands incorporating nutritional management into lifestyle. Comprehends key points    Patient undertands incorporating physical activity into lifestyle. Comprehends key points    Patient understands using medications safely. Comphrehends key points    Patient understands monitoring blood glucose, interpreting and using results Comprehends key points    Patient understands prevention, detection, and treatment of acute complications. Comprehends key points    Patient  understands prevention, detection, and treatment of chronic complications. Comprehends key points    Patient understands how to develop strategies to address psychosocial issues. Comprehends key points    Patient understands how to develop strategies to promote health/change behavior. Comprehends key points      Outcomes   Expected Outcomes Demonstrated interest in learning. Expect positive outcomes    Future DMSE 3-4 months    Program Status Not Completed      Subsequent Visit   Since your last visit have you continued or begun to take your medications as prescribed? Yes    Since your last visit have you experienced any weight changes? Loss    Weight Loss (lbs) 9    Since your last visit, are you checking your blood glucose at least once a day? Yes             Individualized Plan for Diabetes Self-Management Training:   Learning Objective:  Patient will have a greater understanding of diabetes self-management. Patient education plan is to attend individual and/or group sessions per assessed needs and concerns.   Plan:   Patient Instructions  Consider having your vitamin B-12 checked again.  Consider Stevia rather than sugar in your tea. Continue to check your blood sugar daily. Continue to walk daily. Congratulations on stopping smoking!!  Expected Outcomes:  Demonstrated interest in learning. Expect positive outcomes  Education material provided:   If problems or questions, patient to contact team via:  Phone  Future DSME appointment: 3-4 months

## 2022-01-10 NOTE — Patient Instructions (Addendum)
Consider having your vitamin B-12 checked again.   Consider Stevia rather than sugar in your tea. Continue to check your blood sugar daily. Continue to walk daily. Congratulations on stopping smoking!! 

## 2022-01-21 ENCOUNTER — Other Ambulatory Visit: Payer: Self-pay | Admitting: *Deleted

## 2022-01-21 DIAGNOSIS — I739 Peripheral vascular disease, unspecified: Secondary | ICD-10-CM

## 2022-01-21 DIAGNOSIS — I70213 Atherosclerosis of native arteries of extremities with intermittent claudication, bilateral legs: Secondary | ICD-10-CM

## 2022-01-29 ENCOUNTER — Ambulatory Visit (HOSPITAL_COMMUNITY)
Admission: RE | Admit: 2022-01-29 | Discharge: 2022-01-29 | Disposition: A | Discharge status: Home / Self Care | Payer: Medicaid Other | Source: Ambulatory Visit | Attending: Vascular Surgery | Admitting: Vascular Surgery

## 2022-01-29 ENCOUNTER — Ambulatory Visit (INDEPENDENT_AMBULATORY_CARE_PROVIDER_SITE_OTHER)
Admission: RE | Admit: 2022-01-29 | Discharge: 2022-01-29 | Disposition: A | Discharge status: Home / Self Care | Payer: Medicaid Other | Source: Ambulatory Visit | Attending: Vascular Surgery | Admitting: Vascular Surgery

## 2022-01-29 ENCOUNTER — Ambulatory Visit (INDEPENDENT_AMBULATORY_CARE_PROVIDER_SITE_OTHER): Payer: Medicaid Other | Admitting: Physician Assistant

## 2022-01-29 VITALS — BP 145/91 | HR 117 | Temp 97.9°F | Resp 20 | Ht 68.0 in | Wt 235.9 lb

## 2022-01-29 DIAGNOSIS — I70213 Atherosclerosis of native arteries of extremities with intermittent claudication, bilateral legs: Secondary | ICD-10-CM

## 2022-01-29 DIAGNOSIS — I739 Peripheral vascular disease, unspecified: Secondary | ICD-10-CM | POA: Diagnosis not present

## 2022-01-29 NOTE — Progress Notes (Signed)
Office Note     CC:  follow up Requesting Provider:  Mackie Pai, PA-C  HPI: Thomas [name] is a 46 y.o. (1976-03-29) male who presents status post right leg arteriogram with proximal SFA angioplasty and stenting as well as TP trunk and PT angioplasty by Dr. Carlis Abbott on 12/27/2021.  This was performed due to a threatened bypass graft with low flow velocities.  He also underwent tibial intervention in May 2022.  He has also had left SFA angioplasty as well as tibial intervention later in May 2022.  It should also be noted that his right external iliac artery stent was widely patent during recent angiography.  An interpreter was used for today's visit.  Patient states his short distance claudication of the right leg has completely resolved since angiography.  He is on aspirin, Plavix, statin daily.  He denies any pain in the left groin catheterization site.   Past Medical History:  Diagnosis Date   Depression 01/27/2015   Diabetes mellitus without complication ([place])    Hyperlipidemia    Hypertension    Peripheral vascular disease [facility])     Past Surgical History:  Procedure Laterality Date   ABDOMINAL AORTOGRAM W/LOWER EXTREMITY Right 04/19/2019   Procedure: ABDOMINAL AORTOGRAM W/LOWER EXTREMITY;  Surgeon: Marty Heck, MD;  Location: [facility];  Service: Cardiovascular;  Laterality: Right;   ABDOMINAL AORTOGRAM W/LOWER EXTREMITY Right 09/16/2019   Procedure: ABDOMINAL AORTOGRAM W/LOWER EXTREMITY;  Surgeon: Marty Heck, MD;  Location: [place] [facility];  Service: Cardiovascular;  Laterality: Right;   ABDOMINAL AORTOGRAM W/LOWER EXTREMITY N/A 08/17/2020   Procedure: ABDOMINAL AORTOGRAM W/LOWER EXTREMITY;  Surgeon: Marty Heck, MD;  Location: [facility];  Service: Cardiovascular;  Laterality: N/A;   ABDOMINAL AORTOGRAM W/LOWER EXTREMITY N/A 12/27/2021   Procedure: ABDOMINAL AORTOGRAM W/LOWER EXTREMITY;  Surgeon: Marty Heck, MD;  Location: [facility];  Service: Cardiovascular;  Laterality: N/A;   FEMORAL-TIBIAL BYPASS GRAFT Right 04/22/2019   Procedure: Right  FEMORAL-TIBIAL ARTERY BYPASS GRAFT with Harvest of Saphenous vein RIGHT SUPERFICIAL FEMORAL ARTERY TO TIBIOPERONEAL TRUNK BYPASS;  Surgeon: Marty Heck, MD;  Location: [facility];  Service: Vascular;  Laterality: Right;   IR CERVICAL/THORACIC DISC ASPIRATION W/IMAG GUIDE  04/15/2019   IR FLUORO GUIDED NEEDLE PLC ASPIRATION/INJECTION LOC  04/15/2019   kidney stones  2009   PERIPHERAL VASCULAR BALLOON ANGIOPLASTY Right 09/16/2019   Procedure: PERIPHERAL VASCULAR BALLOON ANGIOPLASTY;  Surgeon: Marty Heck, MD;  Location: [facility];  Service: Cardiovascular;  Laterality: Right;  ext iliac DCB   PERIPHERAL VASCULAR BALLOON ANGIOPLASTY  08/17/2020   Procedure: PERIPHERAL VASCULAR BALLOON ANGIOPLASTY;  Surgeon: Marty Heck, MD;  Location: [facility];  Service: Cardiovascular;;  Right TP trunk   PERIPHERAL VASCULAR BALLOON ANGIOPLASTY Left 08/24/2020   Procedure: PERIPHERAL VASCULAR BALLOON ANGIOPLASTY;  Surgeon: Marty Heck, MD;  Location: [facility];  Service: Cardiovascular;  Laterality: Left;  Tibioperoneal trunk and superficial femoral arteries.   PERIPHERAL VASCULAR BALLOON ANGIOPLASTY  12/27/2021   Procedure: PERIPHERAL VASCULAR BALLOON ANGIOPLASTY;  Surgeon: Marty Heck, MD;  Location: [facility];  Service: Cardiovascular;;   PERIPHERAL VASCULAR INTERVENTION  04/19/2019   Procedure: PERIPHERAL VASCULAR INTERVENTION;  Surgeon: Marty Heck, MD;  Location: [facility];  Service: Cardiovascular;;  right external iliac   PERIPHERAL VASCULAR INTERVENTION  12/27/2021   Procedure: PERIPHERAL VASCULAR INTERVENTION;  Surgeon: Marty Heck, MD;  Location: [place]  [facility];  Service: Cardiovascular;;   TEE WITHOUT CARDIOVERSION N/A 04/19/2019   Procedure: TRANSESOPHAGEAL ECHOCARDIOGRAM (TEE);   Surgeon: Donato Heinz, MD;  Location: [facility];  Service: Endoscopy;  Laterality: N/A;    Social History   Socioeconomic History   Marital status: Single    Spouse name: Not on file   Number of children: Not on file   Years of education: Not on file   Highest education level: Not on file  Occupational History   Occupation: Unemployed  Tobacco Use   Smoking status: Former    Packs/day: 0.25    Types: Cigarettes    Passive exposure: Never   Smokeless tobacco: Former   Tobacco comments:    5 cigarrettes a day  Vaping Use   Vaping Use: Never used  Substance and Sexual Activity   Alcohol use: No    Alcohol/week: 0.0 standard drinks of alcohol   Drug use: No   Sexual activity: Not on file  Other Topics Concern   Not on file  Social History Narrative   Not on file   Social Determinants of Health   Financial Resource Strain: Not on file  Food Insecurity: Not on file  Transportation Needs: Not on file  Physical Activity: Not on file  Stress: Not on file  Social Connections: Not on file  Intimate Partner Violence: Not on file    Family History  Problem Relation Age of Onset   Colon cancer Neg Hx    Esophageal cancer Neg Hx    Pancreatic cancer Neg Hx    Liver cancer Neg Hx    Stomach cancer Neg Hx     Current Outpatient Medications  Medication Sig Dispense Refill   acetaminophen (TYLENOL) 500 MG tablet Take 500 mg by mouth 2 (two) times daily.     aspirin EC 81 MG tablet Take 81 mg by mouth daily. Swallow whole.     atorvastatin (LIPITOR) 20 MG tablet Take 1 tablet (20 mg total) by mouth daily. 90 tablet 3   clopidogrel (PLAVIX) 75 MG tablet TAKE ONE TABLET BY MOUTH DAILY WITH BREAKFAST 90 tablet 2   empagliflozin (JARDIANCE) 25 MG TABS tablet Take 1 tablet (25 mg total) by mouth daily before breakfast. 90 tablet 3   Heating Pads PADS Use over the counter heating pads as needed for back pain 1 Units 5   insulin glargine (LANTUS SOLOSTAR) 100 UNIT/ML  Solostar Pen Inject 34 Units into the skin daily. 30 mL 3   insulin lispro (HUMALOG KWIKPEN) 100 UNIT/ML KwikPen Inject 10 Units into the skin 3 (three) times daily. 30 mL 3   lisinopril (ZESTRIL) 20 MG tablet TAKE ONE TABLET BY MOUTH DAILY 90 tablet 3   omeprazole (PRILOSEC) 20 MG capsule TAKE ONE CAPSULE BY MOUTH DAILY 90 capsule 0   tadalafil (CIALIS) 5 MG tablet Take 1 tablet (5 mg total) by mouth daily as needed for erectile dysfunction. 30 tablet 0   No current facility-administered medications for this visit.    No Known Allergies   REVIEW OF SYSTEMS:   [X]  denotes positive finding, [ ]  denotes negative finding Cardiac  Comments:  Chest pain or chest pressure:    Shortness of breath upon exertion:    Short of breath when lying flat:    Irregular heart rhythm:        Vascular    Pain in calf, thigh, or hip brought on by ambulation:    Pain in feet at night that wakes you  up from your sleep:     Blood clot in your veins:    Leg swelling:         Pulmonary    Oxygen at home:    Productive cough:     Wheezing:         Neurologic    Sudden weakness in arms or legs:     Sudden numbness in arms or legs:     Sudden onset of difficulty speaking or slurred speech:    Temporary loss of vision in one eye:     Problems with dizziness:         Gastrointestinal    Blood in stool:     Vomited blood:         Genitourinary    Burning when urinating:     Blood in urine:        Psychiatric    Major depression:         Hematologic    Bleeding problems:    Problems with blood clotting too easily:        Skin    Rashes or ulcers:        Constitutional    Fever or chills:      PHYSICAL EXAMINATION:  Vitals:   01/29/22 1349  BP: (!) 145/91  Pulse: (!) 117  Resp: 20  Temp: 97.9 F (36.6 C)  TempSrc: Temporal  SpO2: 95%  Weight: 235 lb 14.4 oz (107 kg)  Height: 5\' 8"  (1.727 m)    General:  WDWN in NAD; vital signs documented above Gait: Not observed HENT:  WNL, normocephalic Pulmonary: normal non-labored breathing , without Rales, rhonchi,  wheezing Cardiac: regular HR Abdomen: soft, NT, no masses Skin: without rashes Vascular Exam/Pulses: Brisk right PT by Doppler Extremities: Left groin catheterization site without hematoma Musculoskeletal: no muscle wasting or atrophy  Neurologic: A&O X 3;  No focal weakness or paresthesias are detected Psychiatric:  The pt has Normal affect.   Non-Invasive Vascular Imaging:   Widely patent right leg bypass graft and SFA stent ABI/TBIToday's ABIToday's TBIPrevious ABIPrevious TBI  +-------+-----------+-----------+------------+------------+  Right  0.88       0.28       0.59        0.36          +-------+-----------+-----------+------------+------------+  Left   0.53       0.28       0.61        0.41          +-------+-----------+-----------+------------+------------+    ASSESSMENT/PLAN:: 46 y.o. male status post proximal right SFA stent as well as TP trunk and PT angioplasty   -Subjectively right leg short distance claudication symptoms have completely resolved since angiography -Arterial duplex demonstrates a widely patent right leg bypass graft now with widely patent inflow and outflow after intervention -Continue aspirin, Plavix, statin daily -Recheck right leg duplex and ABI in 3 months   Dagoberto Ligas, PA-C [facility] [phone]  Clinic MD:   Carlis Abbott

## 2022-01-31 ENCOUNTER — Other Ambulatory Visit: Payer: Self-pay

## 2022-01-31 DIAGNOSIS — I739 Peripheral vascular disease, unspecified: Secondary | ICD-10-CM

## 2022-01-31 DIAGNOSIS — I70213 Atherosclerosis of native arteries of extremities with intermittent claudication, bilateral legs: Secondary | ICD-10-CM

## 2022-02-18 ENCOUNTER — Ambulatory Visit: Payer: Medicaid Other | Admitting: Neurology

## 2022-02-18 ENCOUNTER — Encounter: Payer: Self-pay | Admitting: Neurology

## 2022-02-18 VITALS — BP 147/77 | HR 50 | Ht 68.0 in | Wt 230.0 lb

## 2022-02-18 DIAGNOSIS — R202 Paresthesia of skin: Secondary | ICD-10-CM

## 2022-02-18 NOTE — Progress Notes (Signed)
[facility] Clinic Note - Initial Visit   Date: 02/18/2022   Toure Edmonds MRN: [id number] DOB: 02/05/1976   Dear Mackie Pai, PA-C:  Thank you for your kind referral of Abdulai Blaylock for consultation of left hand tingling. Although his history is well known to you, please allow [place] to reiterate it for the purpose of our medical record. The patient was accompanied to the clinic by self.  Spanish interpretor via telephone was used during the visit.     Anna Livers is a 46 y.o. right-handed Arabic  male with diabetes mellitus, hypertension, peripheral arterial disease s/p bilateral revascularization, and hyperlipidemia presenting for evaluation of left hand tingling.   IMPRESSION/PLAN: Left ulnar neuropathy vs C8 radiculopathy  - NCS/EMG of the left hand   Further recommendations pending results.   ------------------------------------------------------------- History of present illness: Starting around 2020, he has numbness and tingling involving the left 3th, 4th, and 5th fingers, which has been constant.  He has some weakness in the hand with grip. Occasionally, he has neck pain.  No radicular arm pain.  Symptoms are worse at night time.  Nothing improves his symptoms.   Out-side paper records, electronic medical record, and images have been reviewed where available and summarized as:  Lab Results  Component Value Date   HGBA1C 7.5 (A) 08/31/2021   Lab Results  Component Value Date   ELFYBOFB51 025 07/25/2015   No results found for: "TSH" Lab Results  Component Value Date   ESRSEDRATE 6 01/25/2020    Past Medical History:  Diagnosis Date   Depression 01/27/2015   Diabetes mellitus without complication ([place])    Hyperlipidemia    Hypertension    Peripheral vascular disease ([place])     Past Surgical History:  Procedure Laterality Date   ABDOMINAL AORTOGRAM W/LOWER EXTREMITY Right 04/19/2019   Procedure: ABDOMINAL AORTOGRAM W/LOWER  EXTREMITY;  Surgeon: Marty Heck, MD;  Location: [facility] [facility];  Service: Cardiovascular;  Laterality: Right;   ABDOMINAL AORTOGRAM W/LOWER EXTREMITY Right 09/16/2019   Procedure: ABDOMINAL AORTOGRAM W/LOWER EXTREMITY;  Surgeon: Marty Heck, MD;  Location: [place] [place] [facility];  Service: Cardiovascular;  Laterality: Right;   ABDOMINAL AORTOGRAM W/LOWER EXTREMITY N/A 08/17/2020   Procedure: ABDOMINAL AORTOGRAM W/LOWER EXTREMITY;  Surgeon: Marty Heck, MD;  Location: [facility];  Service: Cardiovascular;  Laterality: N/A;   ABDOMINAL AORTOGRAM W/LOWER EXTREMITY N/A 12/27/2021   Procedure: ABDOMINAL AORTOGRAM W/LOWER EXTREMITY;  Surgeon: Marty Heck, MD;  Location: [place] [facility];  Service: Cardiovascular;  Laterality: N/A;   FEMORAL-TIBIAL BYPASS GRAFT Right 04/22/2019   Procedure: Right  FEMORAL-TIBIAL ARTERY BYPASS GRAFT with Harvest of Saphenous vein RIGHT SUPERFICIAL FEMORAL ARTERY TO TIBIOPERONEAL TRUNK BYPASS;  Surgeon: Marty Heck, MD;  Location: [place];  Service: Vascular;  Laterality: Right;   IR CERVICAL/THORACIC DISC ASPIRATION W/IMAG GUIDE  04/15/2019   IR FLUORO GUIDED NEEDLE PLC ASPIRATION/INJECTION LOC  04/15/2019   kidney stones  2009   PERIPHERAL VASCULAR BALLOON ANGIOPLASTY Right 09/16/2019   Procedure: PERIPHERAL VASCULAR BALLOON ANGIOPLASTY;  Surgeon: Marty Heck, MD;  Location: [place] [facility];  Service: Cardiovascular;  Laterality: Right;  ext iliac DCB   PERIPHERAL VASCULAR BALLOON ANGIOPLASTY  08/17/2020   Procedure: PERIPHERAL VASCULAR BALLOON ANGIOPLASTY;  Surgeon: Marty Heck, MD;  Location: [place] [facility];  Service: Cardiovascular;;  Right TP trunk   PERIPHERAL VASCULAR BALLOON ANGIOPLASTY Left 08/24/2020   Procedure: PERIPHERAL VASCULAR BALLOON ANGIOPLASTY;  Surgeon: Monica Martinez  J, MD;  [facility];  Service: Cardiovascular;  Laterality: Left;  Tibioperoneal trunk and  superficial femoral arteries.   PERIPHERAL VASCULAR BALLOON ANGIOPLASTY  12/27/2021   Procedure: PERIPHERAL VASCULAR BALLOON ANGIOPLASTY;  Surgeon: [name], MD;  [facility];  Service: Cardiovascular;;   PERIPHERAL VASCULAR INTERVENTION  04/19/2019   Procedure: PERIPHERAL VASCULAR INTERVENTION;  Surgeon: [name], MD;  [facility];  Service: Cardiovascular;;  right external iliac   PERIPHERAL VASCULAR INTERVENTION  12/27/2021   Procedure: PERIPHERAL VASCULAR INTERVENTION;  Surgeon: [name], MD;  [facility];  Service: Cardiovascular;;   TEE WITHOUT CARDIOVERSION N/A 04/19/2019   Procedure: TRANSESOPHAGEAL ECHOCARDIOGRAM (TEE);  Surgeon: [name], MD;  Location: [facility];  Service: Endoscopy;  Laterality: N/A;     Medications:  Outpatient Encounter Medications as of 02/18/2022  Medication Sig Note   acetaminophen (TYLENOL) 500 MG tablet Take 500 mg by mouth 2 (two) times daily.    aspirin EC 81 MG tablet Take 81 mg by mouth daily. Swallow whole.    atorvastatin (LIPITOR) 20 MG tablet Take 1 tablet (20 mg total) by mouth daily.    clopidogrel (PLAVIX) 75 MG tablet TAKE ONE TABLET BY MOUTH DAILY WITH BREAKFAST    empagliflozin (JARDIANCE) 25 MG TABS tablet Take 1 tablet (25 mg total) by mouth daily before breakfast.    Heating Pads PADS Use over the counter heating pads as needed for back pain    insulin glargine (LANTUS SOLOSTAR) 100 UNIT/ML Solostar Pen Inject 34 Units into the skin daily. 12/20/2021: 34 units yesterday 1000   insulin lispro (HUMALOG KWIKPEN) 100 UNIT/ML KwikPen Inject 10 Units into the skin 3 (three) times daily.    lisinopril (ZESTRIL) 20 MG tablet TAKE ONE TABLET BY MOUTH DAILY    omeprazole (PRILOSEC) 20 MG capsule TAKE ONE CAPSULE BY MOUTH DAILY    tadalafil (CIALIS) 5 MG tablet Take 1 tablet (5 mg total) by mouth daily as needed for erectile dysfunction.    No  facility-administered encounter medications on file as of 02/18/2022.    Allergies: No Known Allergies  Family History: Family History  Problem Relation Age of Onset   Colon cancer Neg Hx    Esophageal cancer Neg Hx    Pancreatic cancer Neg Hx    Liver cancer Neg Hx    Stomach cancer Neg Hx     Social History: Social History   Tobacco Use   Smoking status: Every Day    Packs/day: 0.25    Types: Cigarettes    Passive exposure: Never   Smokeless tobacco: Former   Tobacco comments:    2 cigarrettes a day  Vaping Use   Vaping Use: Never used  Substance Use Topics   Alcohol use: No    Alcohol/week: 0.0 standard drinks of alcohol   Drug use: No   Social History   Social History Narrative   Are you right handed or left handed? Right Handed    Are you currently employed ? No work    What is your current occupation?   Do you live at home alone? Yes   Who lives with you? No   What type of home do you live in: 1 story or 2 story? One story home        Vital Signs:  BP (!) 147/77   Pulse (!) 50   Ht 5\' 8"  (1.727 m)   Wt 230 lb (  104.3 kg)   SpO2 98%   BMI 34.97 kg/m    Neurological Exam: MENTAL STATUS including orientation to time, place, person, recent and remote memory, attention span and concentration, language, and fund of knowledge is normal.  Speech is not dysarthric.  CRANIAL NERVES: II:  No visual field defects.   III-IV-VI: Pupils equal round and reactive to light.  Normal conjugate, extra-ocular eye movements in all directions of gaze.  No nystagmus.  No ptosis.   V:  Normal facial sensation.    VII:  Normal facial symmetry and movements.   VIII:  Normal hearing and vestibular function.   IX-X:  Normal palatal movement.   XI:  Normal shoulder shrug and head rotation.   XII:  Normal tongue strength and range of motion, no deviation or fasciculation.  MOTOR:  Motor strength is 5/5 throughout except trace weakness 5-/5 in left interosseus muscles. No  atrophy, fasciculations or abnormal movements.  No pronator drift.   MSRs:                                           Right        Left brachioradialis 2+  2+  biceps 2+  2+  triceps 2+  2+  patellar 1+  1+  ankle jerk 1+  1+  Hoffman no  no  plantar response down  down   SENSORY:  Normal and symmetric perception of light touch, pinprick, vibration, and temperature.   COORDINATION/GAIT: Normal finger-to- nose-finger.  Intact rapid alternating movements bilaterally. Gait is mildly wide-based, stable unassisted.    Thank you for allowing me to participate in patient's care.  If I can answer any additional questions, I would be pleased to do so.    Sincerely,    Carlo Lorson K. Allena Katz, DO

## 2022-02-18 NOTE — Patient Instructions (Signed)
Nerve testing of the left arm  ELECTROMYOGRAM AND NERVE CONDUCTION STUDIES (EMG/NCS) INSTRUCTIONS  How to Prepare The neurologist conducting the EMG will need to know if you have certain medical conditions. Tell the neurologist and other EMG lab personnel if you: Have a pacemaker or any other electrical medical device Take blood-thinning medications Have hemophilia, a blood-clotting disorder that causes prolonged bleeding Bathing Take a shower or bath shortly before your exam in order to remove oils from your skin. Don't apply lotions or creams before the exam.  What to Expect You'll likely be asked to change into a hospital gown for the procedure and lie down on an examination table. The following explanations can help you understand what will happen during the exam.  Electrodes. The neurologist or a technician places surface electrodes at various locations on your skin depending on where you're experiencing symptoms. Or the neurologist may insert needle electrodes at different sites depending on your symptoms.  Sensations. The electrodes will at times transmit a tiny electrical current that you may feel as a twinge or spasm. The needle electrode may cause discomfort or pain that usually ends shortly after the needle is removed. If you are concerned about discomfort or pain, you may want to talk to the neurologist about taking a short break during the exam.  Instructions. During the needle EMG, the neurologist will assess whether there is any spontaneous electrical activity when the muscle is at rest - activity that isn't present in healthy muscle tissue - and the degree of activity when you slightly contract the muscle.  He or she will give you instructions on resting and contracting a muscle at appropriate times. Depending on what muscles and nerves the neurologist is examining, he or she may ask you to change positions during the exam.  After your EMG You may experience some temporary, minor  bruising where the needle electrode was inserted into your muscle. This bruising should fade within several days. If it persists, contact your primary care doctor.   

## 2022-03-02 ENCOUNTER — Other Ambulatory Visit: Payer: Self-pay | Admitting: Medical

## 2022-03-04 ENCOUNTER — Ambulatory Visit: Payer: Medicaid Other | Admitting: Internal Medicine

## 2022-03-04 ENCOUNTER — Encounter: Payer: Self-pay | Admitting: Internal Medicine

## 2022-03-04 DIAGNOSIS — R809 Proteinuria, unspecified: Secondary | ICD-10-CM

## 2022-03-04 DIAGNOSIS — E1151 Type 2 diabetes mellitus with diabetic peripheral angiopathy without gangrene: Secondary | ICD-10-CM

## 2022-03-04 DIAGNOSIS — E785 Hyperlipidemia, unspecified: Secondary | ICD-10-CM | POA: Diagnosis not present

## 2022-03-04 DIAGNOSIS — E1129 Type 2 diabetes mellitus with other diabetic kidney complication: Secondary | ICD-10-CM | POA: Diagnosis not present

## 2022-03-04 DIAGNOSIS — E1142 Type 2 diabetes mellitus with diabetic polyneuropathy: Secondary | ICD-10-CM | POA: Diagnosis not present

## 2022-03-04 DIAGNOSIS — Z794 Long term (current) use of insulin: Secondary | ICD-10-CM | POA: Diagnosis not present

## 2022-03-04 LAB — POCT GLYCOSYLATED HEMOGLOBIN (HGB A1C): Hemoglobin A1C: 6.5 % — AB (ref 4.0–5.6)

## 2022-03-04 LAB — POCT GLUCOSE (DEVICE FOR HOME USE): Glucose Fasting, POC: 159 mg/dL — AB (ref 70–99)

## 2022-03-04 MED ORDER — INSULIN PEN NEEDLE 32G X 4 MM MISC: 1.0000 | Freq: Four times a day (QID) | 3 refills | Status: DC

## 2022-03-04 MED ORDER — EMPAGLIFLOZIN 25 MG PO TABS: 25.0000 mg | ORAL_TABLET | Freq: Every day | ORAL | 3 refills | Status: DC

## 2022-03-04 MED ORDER — INSULIN LISPRO (1 UNIT DIAL) 100 UNIT/ML (KWIKPEN): 8.0000 [IU] | PEN_INJECTOR | Freq: Three times a day (TID) | SUBCUTANEOUS | 3 refills | Status: DC

## 2022-03-04 MED ORDER — LANTUS SOLOSTAR 100 UNIT/ML ~~LOC~~ SOPN: 34.0000 [IU] | PEN_INJECTOR | Freq: Every day | SUBCUTANEOUS | 3 refills | Status: DC

## 2022-03-04 NOTE — Progress Notes (Signed)
Name: Thomas [name]  Age/ Sex: 46 y.o., male   MRN/ DOB: [id number], 02-03-1976     PCP: Marisue Brooklyn   Reason for Endocrinology Evaluation: Type 2 Diabetes Mellitus  Initial Endocrine Consultative Visit: 09/15/2020    PATIENT IDENTIFIER: [name] is a 46 y.o. male with a past medical history of T2DM, HTN, and dyslipidemia. The patient has followed with Endocrinology clinic since 09/15/2020 for consultative assistance with management of his diabetes.  DIABETIC HISTORY:  [name] was diagnosed with DM in 2008 .  He was on metformin in the past. His hemoglobin A1c has ranged from 6.7% in 2021, peaking at 15.8% in 2020  On his initial visit with me he had an A1c of 11.3%, he was only on basal insulin, he was having a lot of heartburn, and GI symptoms and we opted to start him on prandial insulin  Patient had connectivity issue with Dexcom, stopped using it 2022-not interested in freestyle [place]   Started jardiance 04/2021 SUBJECTIVE:   During the last visit (04/27/2021): A1c 8.0 %, we adjusted MDI regimen       Today (03/04/2022): [name] is here for follow-up on diabetes management.  He checks his blood sugars 1 times daily.  The patient has not had hypoglycemic episodes since the last clinic visit.  He receives his insulin through the [facility] health department   He continues with nausea, vomiting Heartburn , has PPI  He has been following intermittent fasting as well as exercising He has been noted with weight loss    HOME DIABETES REGIMEN:  Humalog 10 units with each meal  Lantus 34  units daily  Jardiance 25 mg daily     Statin: Yes ACE-I/ARB: Yes Prior Diabetic Education: Yes    DIABETIC COMPLICATIONS: Microvascular complications:   Denies: CKD, neuropathy, retinopathy Last Eye Exam: Completed 03/2021  Macrovascular complications:  PVD ( S/P right external iliac angioplasty and stent (04/19/2019) followed by bypass 08/2020 Denies:  CAD, CVA   HISTORY:  Past Medical History:  Past Medical History:  Diagnosis Date   Depression 01/27/2015   Diabetes mellitus without complication ([facility])    Hyperlipidemia    Hypertension    Peripheral vascular disease ([facility])    Past Surgical History:  Past Surgical History:  Procedure Laterality Date   ABDOMINAL AORTOGRAM W/LOWER EXTREMITY Right 04/19/2019   Procedure: ABDOMINAL AORTOGRAM W/LOWER EXTREMITY;  Surgeon: [name], MD;  [facility];  Service: Cardiovascular;  Laterality: Right;   ABDOMINAL AORTOGRAM W/LOWER EXTREMITY Right 09/16/2019   Procedure: ABDOMINAL AORTOGRAM W/LOWER EXTREMITY;  Surgeon: [name], MD;  [facility];  Service: Cardiovascular;  Laterality: Right;   ABDOMINAL AORTOGRAM W/LOWER EXTREMITY N/A 08/17/2020   Procedure: ABDOMINAL AORTOGRAM W/LOWER EXTREMITY;  Surgeon: [name], MD;  [facility];  Service: Cardiovascular;  Laterality: N/A;   ABDOMINAL AORTOGRAM W/LOWER EXTREMITY N/A 12/27/2021   Procedure: ABDOMINAL AORTOGRAM W/LOWER EXTREMITY;  Surgeon: [name], MD;  [facility];  Service: Cardiovascular;  Laterality: N/A;   FEMORAL-TIBIAL BYPASS GRAFT Right 04/22/2019   Procedure: Right  FEMORAL-TIBIAL ARTERY BYPASS GRAFT with Harvest of Saphenous vein RIGHT SUPERFICIAL FEMORAL ARTERY TO TIBIOPERONEAL TRUNK BYPASS;  Surgeon: [name], MD;  [facility];  Service: Vascular;  Laterality: Right;   IR CERVICAL/THORACIC DISC ASPIRATION W/IMAG GUIDE  04/15/2019   IR FLUORO GUIDED NEEDLE PLC ASPIRATION/INJECTION LOC  04/15/2019   kidney stones  2009  PERIPHERAL VASCULAR BALLOON ANGIOPLASTY Right 09/16/2019   Procedure: PERIPHERAL VASCULAR BALLOON ANGIOPLASTY;  Surgeon: [name], MD;  [facility];  Service: Cardiovascular;  Laterality: Right;  ext iliac DCB   PERIPHERAL VASCULAR BALLOON ANGIOPLASTY  08/17/2020    Procedure: PERIPHERAL VASCULAR BALLOON ANGIOPLASTY;  Surgeon: [name], MD;  [facility];  Service: Cardiovascular;;  Right TP trunk   PERIPHERAL VASCULAR BALLOON ANGIOPLASTY Left 08/24/2020   Procedure: PERIPHERAL VASCULAR BALLOON ANGIOPLASTY;  Surgeon: [name], MD;  [facility];  Service: Cardiovascular;  Laterality: Left;  Tibioperoneal trunk and superficial femoral arteries.   PERIPHERAL VASCULAR BALLOON ANGIOPLASTY  12/27/2021   Procedure: PERIPHERAL VASCULAR BALLOON ANGIOPLASTY;  Surgeon: [name], MD;  [facility];  Service: Cardiovascular;;   PERIPHERAL VASCULAR INTERVENTION  04/19/2019   Procedure: PERIPHERAL VASCULAR INTERVENTION;  Surgeon: [name], MD;  [facility];  Service: Cardiovascular;;  right external iliac   PERIPHERAL VASCULAR INTERVENTION  12/27/2021   Procedure: PERIPHERAL VASCULAR INTERVENTION;  Surgeon: [name], MD;  [facility];  Service: Cardiovascular;;   TEE WITHOUT CARDIOVERSION N/A 04/19/2019   Procedure: TRANSESOPHAGEAL ECHOCARDIOGRAM (TEE);  Surgeon: [name], MD;  Location: [facility];  Service: Endoscopy;  Laterality: N/A;   Social History:  reports that he has been smoking cigarettes. He has been smoking an average of .25 packs per day. He has never been exposed to tobacco smoke. He has quit using smokeless tobacco. He reports that he does not drink alcohol and does not use drugs. Family History:  Family History  Problem Relation Age of Onset   Colon cancer Neg Hx    Esophageal cancer Neg Hx    Pancreatic cancer Neg Hx    Liver cancer Neg Hx    Stomach cancer Neg Hx      HOME MEDICATIONS: Allergies as of 03/04/2022   No Known Allergies      Medication List        Accurate as of March 04, 2022 10:34 AM. If you have any questions, ask your nurse or doctor.          acetaminophen 500 MG  tablet Commonly known as: TYLENOL Take 500 mg by mouth 2 (two) times daily.   aspirin EC 81 MG tablet Take 81 mg by mouth daily. Swallow whole.   atorvastatin 20 MG tablet Commonly known as: LIPITOR Take 1 tablet (20 mg total) by mouth daily.   clopidogrel 75 MG tablet Commonly known as: PLAVIX TAKE ONE TABLET BY MOUTH DAILY WITH BREAKFAST   empagliflozin 25 MG Tabs tablet Commonly known as: Jardiance Take 1 tablet (25 mg total) by mouth daily before breakfast.   Heating Pads Pads Use over the counter heating pads as needed for back pain   insulin lispro 100 UNIT/ML KwikPen Commonly known as: HumaLOG KwikPen Inject 10 Units into the skin 3 (three) times daily.   Lantus SoloStar 100 UNIT/ML Solostar Pen Generic drug: insulin glargine Inject 34 Units into the skin daily.   lisinopril 20 MG tablet Commonly known as: ZESTRIL TAKE ONE TABLET BY MOUTH DAILY   omeprazole 20 MG capsule Commonly known as: PRILOSEC TAKE ONE CAPSULE BY MOUTH DAILY   tadalafil 5 MG tablet Commonly known as: CIALIS Take 1 tablet (5 mg total) by mouth daily as needed for erectile dysfunction.         OBJECTIVE:   Vital Signs: BP  136/84 (BP Location: Left Arm, Patient Position: Sitting, Cuff Size: Large)   Pulse 96   Ht 5\' 8"  (1.727 m)   Wt 228 lb (103.4 kg)   SpO2 97%   BMI 34.67 kg/m   Wt Readings from Last 3 Encounters:  03/04/22 228 lb (103.4 kg)  02/18/22 230 lb (104.3 kg)  01/29/22 235 lb 14.4 oz (107 kg)     Exam: General: Pt appears well and is in NAD  Lungs: Clear with good BS bilat   Heart: RRR   Abdomen: soft, nontender, without masses or organomegaly palpable  Extremities: No pretibial edema.   Neuro: MS is good with appropriate affect, pt is alert and Ox3   DM Foot exam 08/31/2021  The skin of the feet shows dried scabs at the dorsal dorsal B/L  The pedal pulses are undetectable  The sensation is absent  to a screening 5.07, 10 gram monofilament  bilaterally   DATA REVIEWED:  Lab Results  Component Value Date   HGBA1C 6.5 (A) 03/04/2022   HGBA1C 7.5 (A) 08/31/2021   HGBA1C 8.0 (A) 04/27/2021     Latest Reference Range & Units 12/27/21 06:39  Sodium 135 - 145 mmol/L 140  Potassium 3.5 - 5.1 mmol/L 3.8  Chloride 98 - 111 mmol/L 106  Glucose 70 - 99 mg/dL 161101 (H)  BUN 6 - 20 mg/dL 24 (H)  Creatinine 0.960.61 - 1.24 mg/dL 0.451.30 (H)  Calcium Ionized 1.15 - 1.40 mmol/L 1.13 (L)    Latest Reference Range & Units 08/31/21 10:32  Total CHOL/HDL Ratio  5  Cholesterol 0 - 200 mg/dL 409149  HDL Cholesterol >81.19>39.00 mg/dL 14.7833.00 (L)  LDL (calc) 0 - 99 mg/dL 95  NonHDL  295.62116.04  Triglycerides 0.0 - 149.0 mg/dL 130.8104.0  VLDL 0.0 - 65.740.0 mg/dL 84.620.8     Latest Reference Range & Units 04/27/21 10:28  Testosterone, Total, LC-MS-MS 250 - 1,100 ng/dL 962409     ASSESSMENT / PLAN / RECOMMENDATIONS:   1) Type 2 Diabetes Mellitus, with improving glycemic control , With neuropathic, retinopathic and macrovascular complications as well as microalbuminuria- Most recent A1c of 6.5 %. Goal A1c <7.0%.      -I have praised the patient on improved glycemic control and weight loss  -We discussed the importance of checking glucose before each meal, unfortunately he did not have a successful trial with Dexcom.  He declined CGM technology -His fasting BG this morning is 159 mg/DL, I would not change his Lantus but will preemptively reduce Humalog to prevent hypoglycemia -[facility] which was [facility] today  -I have refilled Jardiance to his local pharmacy    MEDICATIONS: Continue Lantus 34 units daily  Decrease HUmalog 10 units with each meal  Continue  Jardiance 25 mg daily   EDUCATION / INSTRUCTIONS: BG monitoring instructions: Patient is instructed to check his blood sugars 2 times daily before meals times a day. Call [place] [facility] clinic if: BG persistently < 70  I reviewed the Rule of 15 for  the treatment of hypoglycemia in detail with the patient. Literature supplied.   2) Diabetic complications:  Eye: Does not have known diabetic retinopathy.  Neuro/ Feet: Does  have known diabetic peripheral neuropathy .  Renal: Patient does not have known baseline CKD. He   is  on an ACEI/ARB at present.    3) Dyslipidemia:   -Given peripheral vascular disease, LDL goal <70 mg/dL  -LDL trending down but continues to be  above goal -We will continue to monitor at this time, if LDL remains to be above goal, will increase atorvastatin  Medication Continue atorvastatin 20 mg daily    F/U in 6 months   Signed electronically by: Lyndle Herrlich, MD  [facility] Endocrinology  [facility] [address] [place] [place], [place] [address] Phone: [phone] FAX: [phone]   CC: Marisue Brooklyn [address] [place] [address] Phone: [phone]  Fax: [phone]  Return to Endocrinology clinic as below: Future Appointments  Date Time Provider Department Center  03/12/2022  3:00 PM Glendale Chard, DO [facility] None  04/30/2022  1:00 PM [facility] 1 - [facility]  04/30/2022  1:30 PM [facility] 1 - [facility]  04/30/2022  2:15 PM [facility] PA-2 [facility]  05/09/2022  9:00 AM Jobe, Rolin Barry, RD [facility] NDM

## 2022-03-04 NOTE — Patient Instructions (Signed)
-   Continue Lantus 34 units daily  - Decrease  HUmalog 8 units with meals  - Continue  Jardiance 25 mg daily        HOW TO TREAT LOW BLOOD SUGARS (Blood sugar LESS THAN 70 MG/DL) Please follow the RULE OF 15 for the treatment of hypoglycemia treatment (when your (blood sugars are less than 70 mg/dL)   STEP 1: Take 15 grams of carbohydrates when your blood sugar is low, which includes:  3-4 GLUCOSE TABS  OR 3-4 OZ OF JUICE OR REGULAR SODA OR ONE TUBE OF GLUCOSE GEL    STEP 2: RECHECK blood sugar in 15 MINUTES STEP 3: If your blood sugar is still low at the 15 minute recheck --> then, go back to STEP 1 and treat AGAIN with another 15 grams of carbohydrates.

## 2022-03-12 ENCOUNTER — Ambulatory Visit: Payer: Medicaid Other | Admitting: Neurology

## 2022-03-12 DIAGNOSIS — G5622 Lesion of ulnar nerve, left upper limb: Secondary | ICD-10-CM

## 2022-03-12 DIAGNOSIS — R202 Paresthesia of skin: Secondary | ICD-10-CM | POA: Diagnosis not present

## 2022-03-12 DIAGNOSIS — G5602 Carpal tunnel syndrome, left upper limb: Secondary | ICD-10-CM

## 2022-03-12 NOTE — Procedures (Signed)
[facility]  [address] [place], [address]  [place], [place] [address] Tel: [phone] Fax:  [phone] Test Date:  03/12/2022  Patient: Thomas [name] DOB: 10-11-1975 Physician: Nita Sickle, DO  Sex: Male Height: 5\' 8"  Ref Phys: , DO  ID#: Nita Sickle   Technician:    Patient Complaints: This is a 46 year old man referred for evaluation of left hand paresthesias, worse over the last 3 fingers.  NCV & EMG Findings: Extensive electrodiagnostic testing of the left upper extremity shows:  Left median sensory response shows prolonged latency (5.6 ms) and reduced amplitude (6.5 V).  Left ulnar sensory response is absent.  Left radial sensory response is within normal limits. Left median motor response shows prolonged latency (4.5 ms).  Left ulnar motor response shows reduced amplitude (6.0 mV) and decreased conduction velocity across the elbow (A Elbow-B Elbow, 42 m/s).   Chronic motor axon loss changes are seen affecting the first dorsal interosseus and abductor digiti minimi muscles.  Impression: Left ulnar neuropathy with slowing across the elbow, moderate. Left median neuropathy at or distal to the wrist, consistent with a clinical diagnosis of carpal tunnel syndrome.  Overall, these findings are moderate-to-severe in degree electrically.   ___________________________ 55, DO    Nerve Conduction Studies Anti Sensory Summary Table   Stim Site NR Peak (ms) Norm Peak (ms) O-P Amp (V) Norm O-P Amp  Left Median Anti Sensory (2nd Digit)  32C  Wrist    5.6 <3.4 6.5 >20  Left Radial Anti Sensory (Base 1st Digit)  32C  Wrist    2.6 <2.7 18.6 >18  Left Ulnar Anti Sensory (5th Digit)  32C  Wrist NR  <3.1  >12   Motor Summary Table   Stim Site NR Onset (ms) Norm Onset (ms) O-P Amp (mV) Norm O-P Amp Site1 Site2 Delta-0 (ms) Dist (cm) Vel (m/s) Norm Vel (m/s)  Left Median Motor (Abd Poll Brev)  32C  Wrist    4.5 <3.9 6.2 >6 Elbow Wrist 5.6 30.0 54  >50  Elbow    10.1  5.7         Left Ulnar Motor (Abd Dig Minimi)  32C  Wrist    2.9 <3.1 6.0 >7 B Elbow Wrist 4.4 23.0 52 >50  B Elbow    7.3  5.7  A Elbow B Elbow 2.4 10.0 42 >50  A Elbow    9.7  5.3          EMG   Side Muscle Ins Act Fibs Fasc Recrt Dur. Amp. Poly. Activation Comment  Left 1stDorInt Nml Nml Nml 1- 1+ 1+ 1+ Nml N/A  Left Abd Poll Brev Nml Nml Nml Nml Nml Nml Nml Nml N/A  Left Ext Indicis Nml Nml Nml Nml Nml Nml Nml Nml N/A  Left PronatorTeres Nml Nml Nml Nml Nml Nml Nml Nml N/A  Left Biceps Nml Nml Nml Nml Nml Nml Nml Nml N/A  Left Triceps Nml Nml Nml Nml Nml Nml Nml Nml N/A  Left Deltoid Nml Nml Nml Nml Nml Nml Nml Nml N/A  Left ABD Dig Min Nml Nml Nml 1- 1+ 1+ 1+ Nml N/A  Left FlexCarpiUln Nml Nml Nml Nml Nml Nml Nml Nml N/A      Waveforms:

## 2022-03-13 ENCOUNTER — Telehealth: Payer: Self-pay | Admitting: Neurology

## 2022-03-13 NOTE — Telephone Encounter (Signed)
Results of EMG discussed with patient which showed left ulnar neuropathy at the elbow and left CTS.  Strategies to avoid nerve compression/stretching discussed.  He will follow-up, if symptoms do not improve.

## 2022-04-30 ENCOUNTER — Ambulatory Visit (INDEPENDENT_AMBULATORY_CARE_PROVIDER_SITE_OTHER)
Admission: RE | Admit: 2022-04-30 | Discharge: 2022-04-30 | Disposition: A | Discharge status: Home / Self Care | Payer: Medicaid Other | Source: Ambulatory Visit | Attending: Vascular Surgery | Admitting: Vascular Surgery

## 2022-04-30 ENCOUNTER — Ambulatory Visit (INDEPENDENT_AMBULATORY_CARE_PROVIDER_SITE_OTHER): Payer: Medicaid Other | Admitting: Physician Assistant

## 2022-04-30 ENCOUNTER — Ambulatory Visit (HOSPITAL_COMMUNITY)
Admission: RE | Admit: 2022-04-30 | Discharge: 2022-04-30 | Disposition: A | Discharge status: Home / Self Care | Payer: Medicaid Other | Source: Ambulatory Visit | Attending: Vascular Surgery | Admitting: Vascular Surgery

## 2022-04-30 VITALS — BP 158/108 | HR 90 | Temp 98.2°F | Resp 20 | Ht 68.0 in | Wt 226.8 lb

## 2022-04-30 DIAGNOSIS — I70213 Atherosclerosis of native arteries of extremities with intermittent claudication, bilateral legs: Secondary | ICD-10-CM | POA: Insufficient documentation

## 2022-04-30 DIAGNOSIS — I739 Peripheral vascular disease, unspecified: Secondary | ICD-10-CM

## 2022-04-30 LAB — VAS US ABI WITH/WO TBI
Left ABI: 0.63
Right ABI: 0.94

## 2022-04-30 NOTE — Progress Notes (Signed)
[facility] [place] HISTORY AND PHYSICAL   History of Present Illness:  Patient is a 47 y.o. year old male who presents for evaluation of B LE.   status post right leg arteriogram with proximal SFA angioplasty and stenting as well as TP trunk and PT angioplasty by Dr. Carlis Abbott on 12/27/2021.  This was performed due to a threatened bypass graft with low flow velocities.  He also underwent tibial intervention in May 2022.  He has also had left SFA angioplasty as well as tibial intervention later in May 2022.    He has stopped smoking and is walking for exercise daily 30 mins. at a time. He denise claudication, rest pain or non healing wounds.  He is medically managed on ASA, Plavix and Lipitor.       Past Medical History:  Diagnosis Date   Depression 01/27/2015   Diabetes mellitus without complication ([place])    Hyperlipidemia    Hypertension    Peripheral vascular disease [facility])     Past Surgical History:  Procedure Laterality Date   ABDOMINAL AORTOGRAM W/LOWER EXTREMITY Right 04/19/2019   Procedure: ABDOMINAL AORTOGRAM W/LOWER EXTREMITY;  Surgeon: Marty Heck, MD;  Location: [place] [facility];  Service: Cardiovascular;  Laterality: Right;   ABDOMINAL AORTOGRAM W/LOWER EXTREMITY Right 09/16/2019   Procedure: ABDOMINAL AORTOGRAM W/LOWER EXTREMITY;  Surgeon: Marty Heck, MD;  Location: [place] [facility];  Service: Cardiovascular;  Laterality: Right;   ABDOMINAL AORTOGRAM W/LOWER EXTREMITY N/A 08/17/2020   Procedure: ABDOMINAL AORTOGRAM W/LOWER EXTREMITY;  Surgeon: Marty Heck, MD;  Location: [place] [facility];  Service: Cardiovascular;  Laterality: N/A;   ABDOMINAL AORTOGRAM W/LOWER EXTREMITY N/A 12/27/2021   Procedure: ABDOMINAL AORTOGRAM W/LOWER EXTREMITY;  Surgeon: Marty Heck, MD;  Location: [place] [facility];  Service: Cardiovascular;  Laterality: N/A;   FEMORAL-TIBIAL BYPASS GRAFT Right 04/22/2019   Procedure: Right  FEMORAL-TIBIAL ARTERY  BYPASS GRAFT with Harvest of Saphenous vein RIGHT SUPERFICIAL FEMORAL ARTERY TO TIBIOPERONEAL TRUNK BYPASS;  Surgeon: Marty Heck, MD;  Location: [facility];  Service: Vascular;  Laterality: Right;   IR CERVICAL/THORACIC DISC ASPIRATION W/IMAG GUIDE  04/15/2019   IR FLUORO GUIDED NEEDLE PLC ASPIRATION/INJECTION LOC  04/15/2019   kidney stones  2009   PERIPHERAL VASCULAR BALLOON ANGIOPLASTY Right 09/16/2019   Procedure: PERIPHERAL VASCULAR BALLOON ANGIOPLASTY;  Surgeon: Marty Heck, MD;  Location: [facility];  Service: Cardiovascular;  Laterality: Right;  ext iliac DCB   PERIPHERAL VASCULAR BALLOON ANGIOPLASTY  08/17/2020   Procedure: PERIPHERAL VASCULAR BALLOON ANGIOPLASTY;  Surgeon: Marty Heck, MD;  Location: [place] [facility];  Service: Cardiovascular;;  Right TP trunk   PERIPHERAL VASCULAR BALLOON ANGIOPLASTY Left 08/24/2020   Procedure: PERIPHERAL VASCULAR BALLOON ANGIOPLASTY;  Surgeon: Marty Heck, MD;  Location: [place] [facility];  Service: Cardiovascular;  Laterality: Left;  Tibioperoneal trunk and superficial femoral arteries.   PERIPHERAL VASCULAR BALLOON ANGIOPLASTY  12/27/2021   Procedure: PERIPHERAL VASCULAR BALLOON ANGIOPLASTY;  Surgeon: Marty Heck, MD;  Location: [place];  Service: Cardiovascular;;   PERIPHERAL VASCULAR INTERVENTION  04/19/2019   Procedure: PERIPHERAL VASCULAR INTERVENTION;  Surgeon: Marty Heck, MD;  Location: [place] [facility];  Service: Cardiovascular;;  right external iliac   PERIPHERAL VASCULAR INTERVENTION  12/27/2021   Procedure: PERIPHERAL VASCULAR INTERVENTION;  Surgeon: Marty Heck, MD;  Location: [place] [facility];  Service: Cardiovascular;;   TEE WITHOUT CARDIOVERSION N/A 04/19/2019   Procedure: TRANSESOPHAGEAL  ECHOCARDIOGRAM (TEE);  Surgeon: [name], MD;  Location: [facility]OS[facility];  Service: Endoscopy;  Laterality: N/A;    ROS:   General:  No weight loss, Fever,  chills  HEENT: No recent headaches, no nasal bleeding, no visual changes, no sore throat  Neurologic: No dizziness, blackouts, seizures. No recent symptoms of stroke or mini- stroke. No recent episodes of slurred speech, or temporary blindness.  Cardiac: No recent episodes of chest pain/pressure, no shortness of breath at rest.  No shortness of breath with exertion.  Denies history of atrial fibrillation or irregular heartbeat  Vascular: No history of rest pain in feet.  No history of claudication.  No history of non-healing ulcer, No history of DVT   Pulmonary: No home oxygen, no productive cough, no hemoptysis,  No asthma or wheezing  Musculoskeletal:  [ ]  Arthritis, [ ]  Low back pain,  [ ]  Joint pain  Hematologic:No history of hypercoagulable state.  No history of easy bleeding.  No history of anemia  Gastrointestinal: No hematochezia or melena,  No gastroesophageal reflux, no trouble swallowing  Urinary: [ ]  chronic Kidney disease, [ ]  on HD - [ ]  MWF or [ ]  TTHS, [ ]  Burning with urination, [ ]  Frequent urination, [ ]  Difficulty urinating;   Skin: No rashes  Psychological: No history of anxiety,  No history of depression  Social History Social History   Tobacco Use   Smoking status: Former    Packs/day: 0.25    Types: Cigarettes    Quit date: 01/13/2022    Years since quitting: 0.2    Passive exposure: Never   Smokeless tobacco: Former   Tobacco comments:    2 cigarrettes a day  Vaping Use   Vaping Use: Never used  Substance Use Topics   Alcohol use: No    Alcohol/week: 0.0 standard drinks of alcohol   Drug use: No    Family History Family History  Problem Relation Age of Onset   Colon cancer Neg Hx    Esophageal cancer Neg Hx    Pancreatic cancer Neg Hx    Liver cancer Neg Hx    Stomach cancer Neg Hx     Allergies  No Known Allergies   Current Outpatient Medications  Medication Sig Dispense Refill   acetaminophen (TYLENOL) 500 MG tablet Take 500 mg  by mouth 2 (two) times daily.     aspirin EC 81 MG tablet Take 81 mg by mouth daily. Swallow whole.     atorvastatin (LIPITOR) 20 MG tablet Take 1 tablet (20 mg total) by mouth daily. 90 tablet 3   clopidogrel (PLAVIX) 75 MG tablet TAKE ONE TABLET BY MOUTH DAILY WITH BREAKFAST 90 tablet 2   empagliflozin (JARDIANCE) 25 MG TABS tablet Take 1 tablet (25 mg total) by mouth daily before breakfast. 90 tablet 3   Heating Pads PADS Use over the counter heating pads as needed for back pain 1 Units 5   insulin glargine (LANTUS SOLOSTAR) 100 UNIT/ML Solostar Pen Inject 34 Units into the skin daily. 30 mL 3   insulin lispro (HUMALOG KWIKPEN) 100 UNIT/ML KwikPen Inject 8 Units into the skin 3 (three) times daily. 30 mL 3   Insulin Pen Needle 32G X 4 MM MISC 1 Device by Does not apply route in the morning, at noon, in the evening, and at bedtime. 400 each 3   lisinopril (ZESTRIL) 20 MG tablet TAKE ONE TABLET BY MOUTH DAILY 90 tablet 3   omeprazole (PRILOSEC) 20 MG capsule  TAKE 1 CAPSULE BY MOUTH DAILY 90 capsule 0   tadalafil (CIALIS) 5 MG tablet Take 1 tablet (5 mg total) by mouth daily as needed for erectile dysfunction. 30 tablet 0   No current facility-administered medications for this visit.    Physical Examination  Vitals:   04/30/22 1354  BP: (!) 158/108  Pulse: 90  Resp: 20  Temp: 98.2 F (36.8 C)  TempSrc: Temporal  SpO2: 98%  Weight: 226 lb 12.8 oz (102.9 kg)  Height: 5\' 8"  (1.727 m)    Body mass index is 34.48 kg/m.  General:  Alert and oriented, no acute distress HEENT: Normal Neck: No bruit or JVD Pulmonary: Clear to auscultation bilaterally Cardiac: Regular Rate and Rhythm without murmur Abdomen: Soft, non-tender, non-distended, no mass, no scars Skin: No rash Extremity:  without ischemic changes, without Gangrene , without cellulitis; without open wounds  Musculoskeletal: No deformity or edema  Neurologic: Upper and lower extremity motor 5/5 and symmetric  DATA:   +----------+--------+-----+--------+---------+--------+  RIGHT    PSV cm/sRatioStenosisWaveform Comments  +----------+--------+-----+--------+---------+--------+  CFA Distal121                  triphasic          +----------+--------+-----+--------+---------+--------+  SFA Prox                                stent     +----------+--------+-----+--------+---------+--------+  SFA Mid   113                  triphasic          +----------+--------+-----+--------+---------+--------+  SFA Distal                              graft     +----------+--------+-----+--------+---------+--------+  POP Prox                                graft     +----------+--------+-----+--------+---------+--------+  POP Distal                              graft     +----------+--------+-----+--------+---------+--------+     Right Stent(s):  +------------------+--------+--------+---------+--------+  Origin to prox SFAPSV cm/sStenosisWaveform Comments  +------------------+--------+--------+---------+--------+  Prox to Stent     129             triphasic          +------------------+--------+--------+---------+--------+  Proximal Stent    128             triphasic          +------------------+--------+--------+---------+--------+  Mid Stent         132             triphasic          +------------------+--------+--------+---------+--------+  Distal Stent      113             triphasic          +------------------+--------+--------+---------+--------+  Distal to Stent   117             triphasic          +------------------+--------+--------+---------+--------+     Right Graft #1: mid SFA to TPT  +------------------+--------+--------+----------+--------+  PSV cm/sStenosisWaveform  Comments  +------------------+--------+--------+----------+--------+  Inflow           102             biphasic             +------------------+--------+--------+----------+--------+  Prox Anastomosis  82              monophasic          +------------------+--------+--------+----------+--------+  Proximal Graft    55              monophasic          +------------------+--------+--------+----------+--------+  Mid Graft         55              monophasic          +------------------+--------+--------+----------+--------+  Distal Graft      83              biphasic            +------------------+--------+--------+----------+--------+  Distal Anastomosis81              monophasic          +------------------+--------+--------+----------+--------+  Outflow          56              monophasic          +------------------+--------+--------+----------+--------+    Summary:  Right: Patent right proximal SFA stent with no stenosis.  Patent right mid SFA to TPT bypass graft with no stenosis.   ASSESSMENT/PLAN:   PAD with history of right foot toe wounds that are now healed.  S/P status post right PTA and TP trunk angioplasty by Dr. Carlis Abbott on 08/17/2020.  He then underwent left SFA angioplasty as well as below the knee popliteal, TP trunk, and proximal PTA angioplasty by Dr. Carlis Abbott on 08/24/2020.     His ABI's show improvement and the duplex demonstrates a patent bypass and stent on the right LE today.  He denise symptoms of ischemia.  He will continue to walk for exercise daily.  I will have him f/u in 6 months for B LE ABI and right LE arterial duplex.    If he develops new symptoms of ischemia he will call our office.        Roxy Horseman PA-C [facility] Office: [phone]  MD in clinic [place]

## 2022-05-02 ENCOUNTER — Other Ambulatory Visit: Payer: Self-pay

## 2022-05-02 DIAGNOSIS — I70213 Atherosclerosis of native arteries of extremities with intermittent claudication, bilateral legs: Secondary | ICD-10-CM

## 2022-05-02 DIAGNOSIS — I739 Peripheral vascular disease, unspecified: Secondary | ICD-10-CM

## 2022-05-09 ENCOUNTER — Encounter: Payer: Self-pay | Admitting: Dietician

## 2022-05-09 ENCOUNTER — Encounter: Payer: Medicaid Other | Attending: Internal Medicine | Admitting: Dietician

## 2022-05-09 VITALS — Wt 224.0 lb

## 2022-05-09 DIAGNOSIS — E1159 Type 2 diabetes mellitus with other circulatory complications: Secondary | ICD-10-CM | POA: Insufficient documentation

## 2022-05-09 DIAGNOSIS — Z794 Long term (current) use of insulin: Secondary | ICD-10-CM | POA: Insufficient documentation

## 2022-05-09 NOTE — Patient Instructions (Signed)
Consider having your vitamin B-12 checked again.   Consider Stevia rather than sugar in your tea. Continue to check your blood sugar daily. Continue to walk daily. Congratulations on stopping smoking!!

## 2022-05-09 NOTE — Progress Notes (Signed)
Diabetes Self-Management Education  Visit Type: Follow-up  Appt. Start Time: 0905 Appt. End Time: 0930  05/09/2022  [name], identified by name and date of birth, is a 47 y.o. male with a diagnosis of Diabetes:  .   ASSESSMENT Patient is here today with his Arabic interpretor Sahar from [facility].  He was last seen by this RD on 01/10/2022.  He has lost 21 lbs by fasting.  He does not eat until sunset. He continues not to smoke. He continues to be quite positive and states that he enjoys the [place] class and is making friends.  History of depression.  Weight hx: 68" 224 lbs 05/09/2022 245 lbs 01/11/2022  History includes Type 2 Diabetes (2008), HTN, dyslipidemia, PVD, PVD, depression, dentures Smokes, eats 1-2 meals per day. Labs: 6.5% 03/04/2022 decreased from A1C 7.5% 08/31/2021 and Cholesterol 191, HDL 36, LDL 113, Triglycerides 219, A1c 8.6%, GFR 92 241 lbs 11/2020 Medication includes Lantus 34-35 units each am, Humalog 8 units before each meal, Jardiance   Patient lives alone.  He is from [place]. Patient states that he has not worked since 2020 due to health and injuries.  He states that not working has caused him depression.  He does feel alone as his friends had to leave as he cannot support them. He is not on disability. He does receive food stamps.  1 pm:  2 eggs, occasional cheese 4 pm:  2 Ground meat sandwiches on white, occasional salad or vegetable soup 5 pm:  orange Water, black hot tea with 1 tsp sugar  Walk:  Walks 30-60 minutes daily  Inside at [facility] or outside on nice days.  Weight 224 lb (101.6 kg). Body mass index is 34.06 kg/m.   Diabetes Self-Management Education - 05/09/22 1640       Visit Information   Visit Type Follow-up      Health Coping   How would you rate your overall health? Good      Psychosocial Assessment   Self-care barriers English as a second language    Self-management support Doctor's office    Other persons present  Patient;Interpreter    Patient Concerns Nutrition/Meal planning;Healthy Lifestyle;Weight Control    Special Needs None    Preferred Learning Style No preference indicated    Learning Readiness Ready    How often do you need to have someone help you when you read instructions, pamphlets, or other written materials from your doctor or pharmacy? 1 - Never      Pre-Education Assessment   Patient understands the diabetes disease and treatment process. Comprehends key points    Patient understands incorporating nutritional management into lifestyle. Comprehends key points    Patient undertands incorporating physical activity into lifestyle. Comprehends key points    Patient understands using medications safely. Comprehends key points    Patient understands monitoring blood glucose, interpreting and using results Comprehends key points    Patient understands prevention, detection, and treatment of acute complications. Comprehends key points    Patient understands prevention, detection, and treatment of chronic complications. Compreheands key points    Patient understands how to develop strategies to address psychosocial issues. Comprehends key points    Patient understands how to develop strategies to promote health/change behavior. Comprehends key points      Complications   Last HgB A1C per patient/outside source 6.5 %   03/04/22 decreased from 7.5% 08/31/2021   How often do you check your blood sugar? 1-2 times/day    Fasting Blood  glucose range (mg/dL) 70-129;130-179   100-147     Dietary Intake   Lunch 2 eggs, occasional cheese   1 pm   Dinner 2 ground meat sandwiches on white bread, occasional salad or vegetabe soup   4 pm   Snack (evening) orange   5 pm   Beverage(s) water, 1 cup hot tea with 1 tsp sugar      Activity / Exercise   Activity / Exercise Type Light (walking / raking leaves)    How many days per week do you exercise? 7    How many minutes per day do you exercise? 45     Total minutes per week of exercise 315      Patient Education   Previous Diabetes Education Yes (please comment)   12/2021   Healthy Eating Role of diet in the treatment of diabetes and the relationship between the three main macronutrients and blood glucose level    Being Active Role of exercise on diabetes management, blood pressure control and cardiac health.    Medications Reviewed patients medication for diabetes, action, purpose, timing of dose and side effects.    Monitoring Taught/evaluated SMBG meter.    Diabetes Stress and Support Identified and addressed patients feelings and concerns about diabetes      Individualized Goals (developed by patient)   Nutrition General guidelines for healthy choices and portions discussed    Physical Activity Exercise 5-7 days per week;45 minutes per day    Medications take my medication as prescribed    Monitoring  Test my blood glucose as discussed    Problem Solving Eating Pattern    Reducing Risk examine blood glucose patterns      Patient Self-Evaluation of Goals - Patient rates self as meeting previously set goals (% of time)   Nutrition >75% (most of the time)    Physical Activity >75% (most of the time)    Medications >75% (most of the time)    Monitoring >75% (most of the time)    Problem Solving and behavior change strategies  >75% (most of the time)    Reducing Risk (treating acute and chronic complications) >11% (most of the time)    Health Coping >75% (most of the time)      Post-Education Assessment   Patient understands the diabetes disease and treatment process. Comprehends key points    Patient understands incorporating nutritional management into lifestyle. Comprehends key points    Patient undertands incorporating physical activity into lifestyle. Comprehends key points    Patient understands using medications safely. Comphrehends key points    Patient understands monitoring blood glucose, interpreting and using results  Comprehends key points    Patient understands prevention, detection, and treatment of acute complications. Comprehends key points    Patient understands prevention, detection, and treatment of chronic complications. Comprehends key points    Patient understands how to develop strategies to address psychosocial issues. Comprehends key points    Patient understands how to develop strategies to promote health/change behavior. Comprehends key points      Outcomes   Expected Outcomes Demonstrated interest in learning. Expect positive outcomes    Future DMSE 6 months    Program Status Completed      Subsequent Visit   Since your last visit have you continued or begun to take your medications as prescribed? Yes    Since your last visit have you experienced any weight changes? Loss    Weight Loss (lbs) 21  Individualized Plan for Diabetes Self-Management Training:   Learning Objective:  Patient will have a greater understanding of diabetes self-management. Patient education plan is to attend individual and/or group sessions per assessed needs and concerns.   Plan:   Patient Instructions  Consider having your vitamin B-12 checked again.   Consider Stevia rather than sugar in your tea. Continue to check your blood sugar daily. Continue to walk daily. Congratulations on stopping smoking!!  Expected Outcomes:  Demonstrated interest in learning. Expect positive outcomes  Education material provided:   If problems or questions, patient to contact team via:  Phone  Future DSME appointment: 6 months

## 2022-06-04 ENCOUNTER — Other Ambulatory Visit: Payer: Self-pay | Admitting: Internal Medicine

## 2022-06-04 ENCOUNTER — Other Ambulatory Visit: Payer: Self-pay | Admitting: Medical

## 2022-06-12 NOTE — Telephone Encounter (Signed)
Opened in error

## 2022-09-07 ENCOUNTER — Other Ambulatory Visit: Payer: Self-pay | Admitting: Internal Medicine

## 2022-09-07 ENCOUNTER — Other Ambulatory Visit: Payer: Self-pay | Admitting: Medical

## 2022-09-16 ENCOUNTER — Telehealth: Payer: Self-pay

## 2022-09-16 NOTE — Telephone Encounter (Signed)
Caller: Pt's friend, Morene Rankins  Concern: New swollen middle toe with open wound on top and yellow pus-like drainage  Pt denies any pain, fever, N/V, chills  Location: R foot  Description:  Noticed 2 days ago  Treatments:  Washed with soap and water, instructed him to wash and keep clean and cover with gauze  Resolution: Appointment scheduled to evaluate for infection  Next Appt: Appointment scheduled for PA on 6/4 @ 12:30 PM

## 2022-09-17 ENCOUNTER — Ambulatory Visit: Payer: Medicaid Other | Admitting: Physician Assistant

## 2022-09-17 VITALS — BP 145/76 | HR 76 | Temp 97.6°F | Wt 206.0 lb

## 2022-09-17 DIAGNOSIS — I739 Peripheral vascular disease, unspecified: Secondary | ICD-10-CM

## 2022-09-17 DIAGNOSIS — I70435 Atherosclerosis of autologous vein bypass graft(s) of the right leg with ulceration of other part of foot: Secondary | ICD-10-CM | POA: Diagnosis not present

## 2022-09-17 MED ORDER — SULFAMETHOXAZOLE-TRIMETHOPRIM 800-160 MG PO TABS: 1.0000 | ORAL_TABLET | Freq: Two times a day (BID) | ORAL | 0 refills | Status: DC

## 2022-09-17 MED ORDER — CEPHALEXIN 500 MG PO CAPS: 500.0000 mg | ORAL_CAPSULE | Freq: Two times a day (BID) | ORAL | 0 refills | Status: DC

## 2022-09-17 NOTE — Progress Notes (Signed)
Office Note   History of Present Illness   Marcella Brassard is a 47 y.o. (07-07-1975) male who presents as a triage visit.  He has a history of multiple prior vascular interventions including:  1) right external iliac artery angioplasty with stent placement on 04/19/2019 by Dr. 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 Chestine Spore 3) right external iliac artery stent angioplasty for in-stent stenosis on 09/16/2019 by Dr. Chestine Spore 4) right posterior tibial artery and TP trunk angioplasty on 08/17/2020 by [facility] for lifestyle limiting claudication 5) left SFA, below-knee popliteal artery, tibioperoneal trunk, and proximal posterior tibial artery angioplasty on 08/24/2020 by [facility] for lifestyle limiting claudication 6) right proximal SFA angioplasty with stenting and right TP trunk and posterior tibial artery angioplasty on 12/27/2021 by Dr. Chestine Spore for threatened bypass graft  At his last follow-up with our office in January he was doing well.  He did not have any rest pain, claudication, nonhealing wounds of the lower extremities.  At that time his right SFA stent and right leg bypass graft were patent without stenosis.  He returns today as a triage visit.  An interpreter was used for today's visit.  He states he noticed a wound on his right third toe 5 days ago.  Originally it looked like a "fluid sac" filled with pus.  He states about 4 days ago the wound opened up and pus poured out.  Since then he has tried to keep his foot clean and bandaged.  He endorses significant serous drainage from the wound.  He denies any erythema or pain of the right foot.  He denies any claudication or rest pain.  Current Outpatient Medications  Medication Sig Dispense Refill   acetaminophen (TYLENOL) 500 MG tablet Take 500 mg by mouth 2 (two) times daily.     aspirin EC 81 MG tablet Take 81 mg by mouth daily. Swallow whole.      atorvastatin (LIPITOR) 20 MG tablet TAKE 1 TABLET BY MOUTH DAILY 30 tablet 0   clopidogrel (PLAVIX) 75 MG tablet TAKE ONE TABLET BY MOUTH DAILY WITH BREAKFAST 90 tablet 2   empagliflozin (JARDIANCE) 25 MG TABS tablet Take 1 tablet (25 mg total) by mouth daily before breakfast. 90 tablet 3   Heating Pads PADS Use over the counter heating pads as needed for back pain 1 Units 5   insulin glargine (LANTUS SOLOSTAR) 100 UNIT/ML Solostar Pen Inject 34 Units into the skin daily. 30 mL 3   insulin lispro (HUMALOG KWIKPEN) 100 UNIT/ML KwikPen Inject 8 Units into the skin 3 (three) times daily. 30 mL 3   Insulin Pen Needle 32G X 4 MM MISC 1 Device by Does not apply route in the morning, at noon, in the evening, and at bedtime. 400 each 3   lisinopril (ZESTRIL) 20 MG tablet TAKE ONE TABLET BY MOUTH DAILY 90 tablet 3   omeprazole (PRILOSEC) 20 MG capsule TAKE 1 CAPSULE BY MOUTH DAILY 90 capsule 0   tadalafil (CIALIS) 5 MG tablet Take 1 tablet (5 mg total) by mouth daily as needed for erectile dysfunction. 30 tablet 0   No current facility-administered medications for this visit.    REVIEW OF SYSTEMS (negative unless checked):   Cardiac:  []  Chest pain or chest pressure? []  Shortness of breath upon activity? []  Shortness of breath when lying flat? []  Irregular heart rhythm?  Vascular:  []  Pain in calf, thigh,  or hip brought on by walking? []  Pain in feet at night that wakes you up from your sleep? []  Blood clot in your veins? []  Leg swelling?  Pulmonary:  []  Oxygen at home? []  Productive cough? []  Wheezing?  Neurologic:  []  Sudden weakness in arms or legs? []  Sudden numbness in arms or legs? []  Sudden onset of difficult speaking or slurred speech? []  Temporary loss of vision in one eye? []  Problems with dizziness?  Gastrointestinal:  []  Blood in stool? []  Vomited blood?  Genitourinary:  []  Burning when urinating? []  Blood in urine?  Psychiatric:  []  Major  depression  Hematologic:  []  Bleeding problems? []  Problems with blood clotting?  Dermatologic:  [x]  Rashes or ulcers?  Constitutional:  []  Fever or chills?  Ear/Nose/Throat:  []  Change in hearing? []  Nose bleeds? []  Sore throat?  Musculoskeletal:  []  Back pain? []  Joint pain? []  Muscle pain?   Physical Examination   Vitals:   09/17/22 1220  BP: (!) 145/76  Pulse: 76  Temp: 97.6 F (36.4 C)  TempSrc: Temporal  SpO2: 95%  Weight: 206 lb (93.4 kg)   Body mass index is 31.32 kg/m.  General:  WDWN in NAD; vital signs documented above Gait: Not observed HENT: WNL, normocephalic Pulmonary: normal non-labored breathing , without rales, rhonchi,  wheezing Cardiac: regular Abdomen: soft, NT, no masses Skin: without rashes Vascular Exam/Pulses: Palpable femoral pulses bilaterally.  Brisk right DP/PT Doppler signals Extremities: Right third toe wound with mostly healthy appearing tissue, serous drainage appreciated Musculoskeletal: no muscle wasting or atrophy  Neurologic: A&O X 3;  No focal weakness or paresthesias are detected Psychiatric:  The pt has Normal affect.   Medical Decision Making   Koden Leeder is a 47 y.o. male who presents as a triage visit  The patient has a history of right EIA stenting, proximal right SFA stenting, tibial vessel angioplasty, and right above-knee to TPT trunk bypass with vein for previous CLI. His recent studies in January demonstrated right ABI of 0.94 with triphasic tibial flow. He had a patent right mid SFA to TPT bypass graft without stenosis and patent right proximal SFA stent.  His right external iliac artery stent was visualized during angiogram in September 2023, and was widely patent at the time. He returns today reporting a right third toe wound he noticed about 5 days ago.  This originally was a pocket of "pus" that popped when he was in the shower.  Since then the patient has kept his wound clean and bandage.  He denies  any puslike drainage, but does have significant serous drainage from the wound He denies any rest pain or lower extremity claudication On exam he has palpable femoral pulses.  He also has brisk triphasic DP/PT Doppler signals in the right foot.  Given that he has a new wound of the right foot he may require intervention.  This cannot be properly planned until he gets noninvasive studies. We will schedule patient for close interval follow-up with right aortoiliac duplex, ABIs, and right lower extremity bypass graft duplex.  In the interim I will refer the patient to Dr. Lajoyce Corners for wound care.  The patient will keep his toe clean and dry daily.  I will also send him a week prescription of Keflex for possible underlying infection.   Loel Dubonnet PA-C [facility] Office: [phone]  Clinic MD: Steve Rattler

## 2022-09-18 ENCOUNTER — Other Ambulatory Visit: Payer: Self-pay

## 2022-09-18 DIAGNOSIS — I70213 Atherosclerosis of native arteries of extremities with intermittent claudication, bilateral legs: Secondary | ICD-10-CM

## 2022-09-18 DIAGNOSIS — I739 Peripheral vascular disease, unspecified: Secondary | ICD-10-CM

## 2022-09-18 DIAGNOSIS — I70435 Atherosclerosis of autologous vein bypass graft(s) of the right leg with ulceration of other part of foot: Secondary | ICD-10-CM

## 2022-09-19 ENCOUNTER — Other Ambulatory Visit: Payer: Self-pay | Admitting: Internal Medicine

## 2022-09-24 ENCOUNTER — Ambulatory Visit: Payer: Medicaid Other | Admitting: Orthopedic Surgery

## 2022-09-24 ENCOUNTER — Encounter: Payer: Self-pay | Admitting: Orthopedic Surgery

## 2022-09-24 ENCOUNTER — Other Ambulatory Visit (INDEPENDENT_AMBULATORY_CARE_PROVIDER_SITE_OTHER): Payer: Medicaid Other

## 2022-09-24 DIAGNOSIS — M79671 Pain in right foot: Secondary | ICD-10-CM | POA: Diagnosis not present

## 2022-09-24 NOTE — Progress Notes (Signed)
Office Visit Note   Patient: Thomas [name]           Date of Birth: May 22, 1975           MRN: [id number] Visit Date: 09/24/2022              Requested by: [name], MD [address] [place],  [place] [address] PCP: [name], [place]  Chief Complaint  Patient presents with   Right Foot - Wound Check      HPI: Patient is a 47 year old gentleman who is seen with an interpreter.  Patient states he has been having redness and swelling and drainage from the third toe right foot.  Patient states he is unsure of his hemoglobin A1c he is diabetic he is currently on Keflex.  Patient has undergone revascularization to the right lower extremity with vascular vein surgery.  He mostly recently was seen in the office last week.  Assessment & Plan: Visit Diagnoses:  1. Pain in right foot     Plan: Patient has follow-up with vascular vein surgery he wants to consult with them regarding the recommendation to proceed with amputation of the third toe.  Patient states he is very reluctant to proceed with the amputation of his third toe.  Follow-Up Instructions: No follow-ups on file.   Ortho Exam  Patient is alert, oriented, no adenopathy, well-dressed, normal affect, normal respiratory effort. Examination the Doppler was used and patient has a strong biphasic dorsalis pedis and posterior tibial pulse.  He has sausage digit swelling of the third toe with redness cellulitis and ulceration.  MRI scan from January 2021 shows osteomyelitis of the third toe.  Most recent endovascular intervention December 27, 2021  Imaging: No results found. No images are attached to the encounter.  Labs: Lab Results  Component Value Date   HGBA1C 6.5 (A) 03/04/2022   HGBA1C 7.5 (A) 08/31/2021   HGBA1C 8.0 (A) 04/27/2021   ESRSEDRATE 6 01/25/2020   ESRSEDRATE 9 09/14/2019   ESRSEDRATE 17 (H) 08/03/2019   CRP 2.9 01/25/2020   CRP 1.5 09/14/2019   CRP 3.9 08/03/2019   REPTSTATUS 05/14/2019  FINAL 05/09/2019   GRAMSTAIN NO WBC SEEN RARE GRAM POSITIVE COCCI IN PAIRS  04/15/2019   GRAMSTAIN NO WBC SEEN NO ORGANISMS SEEN  04/15/2019   CULT  05/09/2019    NO GROWTH 5 DAYS Performed at [facility], [address], [place], [place] [address]    LABORGA METHICILLIN RESISTANT STAPHYLOCOCCUS AUREUS 04/15/2019   LABORGA METHICILLIN RESISTANT STAPHYLOCOCCUS AUREUS 04/15/2019     Lab Results  Component Value Date   ALBUMIN 3.9 02/07/2021   ALBUMIN 4.2 11/06/2020   ALBUMIN 3.9 08/03/2020    Lab Results  Component Value Date   MG 1.5 08/27/2017   No results found for: "VD25OH"  No results found for: "PREALBUMIN"    Latest Ref Rng & Units 12/27/2021    6:39 AM 12/20/2021   10:21 AM 08/24/2020    9:17 AM  CBC EXTENDED  Hemoglobin 13.0 - 17.0 g/dL 82.9  56.2  13.0   HCT 39.0 - 52.0 % 41.0  47.0  43.0      There is no height or weight on file to calculate BMI.  Orders:  Orders Placed This Encounter  Procedures   XR Foot Complete Right   No orders of the defined types were placed in this encounter.    Procedures: No procedures performed  Clinical Data: No additional findings.  ROS:  All  other systems negative, except as noted in the HPI. Review of Systems  Objective: Vital Signs: There were no vitals taken for this visit.  Specialty Comments:  No specialty comments available.  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, with long-term current use of insulin (HCC) 04/27/2021   Type 2 diabetes mellitus with microalbuminuria, with long-term current use of insulin (HCC) 04/27/2021   Microalbuminuria 04/27/2021   Type 2 diabetes mellitus with diabetic peripheral angiopathy without gangrene, with long-term current use of insulin (HCC) 12/22/2020   Type 2 diabetes mellitus with diabetic polyneuropathy, with long-term current use of insulin  (HCC) 09/18/2020   Type 2 diabetes mellitus with hyperglycemia, with long-term current use of insulin (HCC) 09/18/2020   Diabetes mellitus (HCC) 09/18/2020   Dyslipidemia 09/18/2020   PAD (peripheral artery disease) (HCC) 06/08/2019   Hematoma 05/09/2019   Discitis of thoracolumbar region    MRSA bacteremia    Endocarditis of mitral valve    Rectal mass 04/14/2019   DVT of popliteal vein (HCC) 04/14/2019   Gangrene of toe of right foot (HCC) 04/14/2019   Osteomyelitis of spine (HCC) 04/13/2019   Diabetes mellitus type II, uncontrolled 01/27/2015   Diabetic neuropathy (HCC) 01/27/2015   HTN (hypertension) 01/27/2015   Depression 01/27/2015   Hyperlipidemia 01/27/2015   Past Medical History:  Diagnosis Date   Depression 01/27/2015   Diabetes mellitus without complication (HCC)    Hyperlipidemia    Hypertension    Peripheral vascular disease (HCC)     Family History  Problem Relation Age of Onset   Colon cancer Neg Hx    Esophageal cancer Neg Hx    Pancreatic cancer Neg Hx    Liver cancer Neg Hx    Stomach cancer Neg Hx     Past Surgical History:  Procedure Laterality Date   ABDOMINAL AORTOGRAM W/LOWER EXTREMITY Right 04/19/2019   Procedure: ABDOMINAL AORTOGRAM W/LOWER EXTREMITY;  Surgeon: [name], MD;  Location: [facility];  Service: Cardiovascular;  Laterality: Right;   ABDOMINAL AORTOGRAM W/LOWER EXTREMITY Right 09/16/2019   Procedure: ABDOMINAL AORTOGRAM W/LOWER EXTREMITY;  Surgeon: [name], MD;  Location: [facility];  Service: Cardiovascular;  Laterality: Right;   ABDOMINAL AORTOGRAM W/LOWER EXTREMITY N/A 08/17/2020   Procedure: ABDOMINAL AORTOGRAM W/LOWER EXTREMITY;  Surgeon: [name], MD;  Location: [facility];  Service: Cardiovascular;  Laterality: N/A;   ABDOMINAL AORTOGRAM W/LOWER EXTREMITY N/A 12/27/2021   Procedure: ABDOMINAL AORTOGRAM W/LOWER EXTREMITY;  Surgeon: [name], MD;  Location: [facility];  Service: Cardiovascular;  Laterality: N/A;   FEMORAL-TIBIAL BYPASS GRAFT Right 04/22/2019   Procedure: Right  FEMORAL-TIBIAL ARTERY BYPASS GRAFT with Harvest of Saphenous vein RIGHT SUPERFICIAL FEMORAL ARTERY TO TIBIOPERONEAL TRUNK BYPASS;  Surgeon: [name], MD;  Location: [facility];  Service: Vascular;  Laterality: Right;   IR CERVICAL/THORACIC DISC ASPIRATION W/IMAG GUIDE  04/15/2019   IR FLUORO GUIDED NEEDLE PLC ASPIRATION/INJECTION LOC  04/15/2019   kidney stones  2009   PERIPHERAL VASCULAR BALLOON ANGIOPLASTY Right 09/16/2019   Procedure: PERIPHERAL VASCULAR BALLOON ANGIOPLASTY;  Surgeon: [name], MD;  Location: [facility];  Service: Cardiovascular;  Laterality: Right;  ext iliac DCB   PERIPHERAL VASCULAR BALLOON ANGIOPLASTY  08/17/2020   Procedure: PERIPHERAL VASCULAR BALLOON ANGIOPLASTY;  Surgeon: [name], MD;  Location: [facility];  Service: Cardiovascular;;  Right TP trunk   PERIPHERAL VASCULAR BALLOON ANGIOPLASTY Left 08/24/2020   Procedure: PERIPHERAL VASCULAR BALLOON ANGIOPLASTY;  Surgeon: [name], MD;  Location: [facility];  Service: Cardiovascular;  Laterality: Left;  Tibioperoneal trunk and superficial femoral arteries.   PERIPHERAL VASCULAR BALLOON ANGIOPLASTY  12/27/2021   Procedure: PERIPHERAL VASCULAR BALLOON ANGIOPLASTY;  Surgeon: [name], MD;  Location: [facility];  Service: Cardiovascular;;   PERIPHERAL VASCULAR INTERVENTION  04/19/2019   Procedure: PERIPHERAL VASCULAR INTERVENTION;  Surgeon: [name], MD;  Location: [facility];  Service: Cardiovascular;;  right external iliac   PERIPHERAL VASCULAR INTERVENTION  12/27/2021   Procedure: PERIPHERAL VASCULAR INTERVENTION;  Surgeon: [name], MD;  Location: [facility];  Service: Cardiovascular;;   TEE WITHOUT CARDIOVERSION N/A 04/19/2019   Procedure: TRANSESOPHAGEAL ECHOCARDIOGRAM (TEE);   Surgeon: [name], MD;  Location: [facility];  Service: Endoscopy;  Laterality: N/A;   Social History   Occupational History   Occupation: Unemployed  Tobacco Use   Smoking status: Former    Packs/day: .25    Types: Cigarettes    Quit date: 01/13/2022    Years since quitting: 0.6    Passive exposure: Never   Smokeless tobacco: Former   Tobacco comments:    2 cigarrettes a day  Vaping Use   Vaping Use: Never used  Substance and Sexual Activity   Alcohol use: No    Alcohol/week: 0.0 standard drinks of alcohol   Drug use: No   Sexual activity: Not on file

## 2022-09-25 LAB — OPHTHALMOLOGY REPORT-SCANNED

## 2022-09-25 NOTE — Progress Notes (Signed)
HISTORY AND PHYSICAL     CC:  follow up. Requesting Provider:  [name], PA-C  HPI: [name] who is here today for follow up for PAD.  Pt has hx of  -angiogram with right EIA angioplasty and stent 04/19/2019 Dr. Chestine Spore -right AK popliteal to TPT bypass with reversed GSV 04/22/2019 Dr. Chestine Spore  -angiogram with right EIA angioplasty for in-stent restenosis 09/16/2019 Dr. Chestine Spore -angiogram with right PTA and TPT angioplasty 08/17/2020 Dr. Chestine Spore -angiogram with left BK popliteal, TPT, proximal PTA angioplasty and left SFA angioplasty 08/25/2022 Dr. Chestine Spore -angiogram with right proximal SFA angioplasty with stent and right TPT and PTA angioplasty 12/27/2021 Dr. Chestine Spore  Pt was last seen 09/17/2022 and at that time, he had a new sore on the right 3rd toe.  He was not having any rest pain, claudication.  He stated the sore was originally filled with pus like drainage.  He was keeping it clean.  He had palpable femoral pulses and brisk doppler signals in the right foot.  He was scheduled for non-invasive studies and follow up.    Pt was seen by Dr. Lajoyce Corners 09/24/2022.  Pt was very reluctant to proceed with amputation of the right 3rd toe until follow up with [facility].   The pt returns today for follow up.  He states he has had this toe wound for about 3 weeks.  He tells me that it has gotten better.  He states it was draining but now it is not.  He has been on abx and has finished them but would like another course of abx.  He denies any claudication or rest pain.  He is still not smoking.  He is on asa/statin.  He works as a Curator.   Pt speaks Arabic and translator with pad used for today's visit.    The pt is on a statin for cholesterol management.    The pt is on an aspirin.    Other AC:  Plavix The pt is on ACEI for hypertension.  The pt is  on medication for diabetes. Tobacco hx:  former    Past Medical History:  Diagnosis Date   Depression 01/27/2015   Diabetes mellitus without  complication (HCC)    Hyperlipidemia    Hypertension    Peripheral vascular disease [facility])     Past Surgical History:  Procedure Laterality Date   ABDOMINAL AORTOGRAM W/LOWER EXTREMITY Right 04/19/2019   Procedure: ABDOMINAL AORTOGRAM W/LOWER EXTREMITY;  Surgeon: [name], MD;  Location: [facility];  Service: Cardiovascular;  Laterality: Right;   ABDOMINAL AORTOGRAM W/LOWER EXTREMITY Right 09/16/2019   Procedure: ABDOMINAL AORTOGRAM W/LOWER EXTREMITY;  Surgeon: [name], MD;  Location: [facility];  Service: Cardiovascular;  Laterality: Right;   ABDOMINAL AORTOGRAM W/LOWER EXTREMITY N/A 08/17/2020   Procedure: ABDOMINAL AORTOGRAM W/LOWER EXTREMITY;  Surgeon: [name], MD;  Location: [facility];  Service: Cardiovascular;  Laterality: N/A;   ABDOMINAL AORTOGRAM W/LOWER EXTREMITY N/A 12/27/2021   Procedure: ABDOMINAL AORTOGRAM W/LOWER EXTREMITY;  Surgeon: [name], MD;  Location: [facility];  Service: Cardiovascular;  Laterality: N/A;   FEMORAL-TIBIAL BYPASS GRAFT Right 04/22/2019   Procedure: Right  FEMORAL-TIBIAL ARTERY BYPASS GRAFT with Harvest of Saphenous vein RIGHT SUPERFICIAL FEMORAL ARTERY TO TIBIOPERONEAL TRUNK BYPASS;  Surgeon: [name], MD;  Location: [facility];  Service: Vascular;  Laterality: Right;   IR CERVICAL/THORACIC DISC ASPIRATION W/IMAG GUIDE  04/15/2019   IR FLUORO  GUIDED NEEDLE PLC ASPIRATION/INJECTION LOC  04/15/2019   kidney stones  2009   PERIPHERAL VASCULAR BALLOON ANGIOPLASTY Right 09/16/2019   Procedure: PERIPHERAL VASCULAR BALLOON ANGIOPLASTY;  Surgeon: [name], MD;  Location: [facility];  Service: Cardiovascular;  Laterality: Right;  ext iliac DCB   PERIPHERAL VASCULAR BALLOON ANGIOPLASTY  08/17/2020   Procedure: PERIPHERAL VASCULAR BALLOON ANGIOPLASTY;  Surgeon: [name], MD;  Location: [facility];  Service: Cardiovascular;;  Right TP trunk    PERIPHERAL VASCULAR BALLOON ANGIOPLASTY Left 08/24/2020   Procedure: PERIPHERAL VASCULAR BALLOON ANGIOPLASTY;  Surgeon: [name], MD;  Location: [facility];  Service: Cardiovascular;  Laterality: Left;  Tibioperoneal trunk and superficial femoral arteries.   PERIPHERAL VASCULAR BALLOON ANGIOPLASTY  12/27/2021   Procedure: PERIPHERAL VASCULAR BALLOON ANGIOPLASTY;  Surgeon: [name], MD;  Location: [facility];  Service: Cardiovascular;;   PERIPHERAL VASCULAR INTERVENTION  04/19/2019   Procedure: PERIPHERAL VASCULAR INTERVENTION;  Surgeon: [name], MD;  [facility]: [facility];  Service: Cardiovascular;;  right external iliac   PERIPHERAL VASCULAR INTERVENTION  12/27/2021   Procedure: PERIPHERAL VASCULAR INTERVENTION;  Surgeon: [name], MD;  Location: [facility];  Service: Cardiovascular;;   TEE WITHOUT CARDIOVERSION N/A 04/19/2019   Procedure: TRANSESOPHAGEAL ECHOCARDIOGRAM (TEE);  Surgeon: [name], MD;  Location: [facility];  Service: Endoscopy;  Laterality: N/A;    No Known Allergies  Current Outpatient Medications  Medication Sig Dispense Refill   acetaminophen (TYLENOL) 500 MG tablet Take 500 mg by mouth 2 (two) times daily.     aspirin EC 81 MG tablet Take 81 mg by mouth daily. Swallow whole.     atorvastatin (LIPITOR) 20 MG tablet TAKE 1 TABLET BY MOUTH DAILY 30 tablet 0   cephALEXin (KEFLEX) 500 MG capsule Take 1 capsule (500 mg total) by mouth 2 (two) times daily. 21 capsule 0   clopidogrel (PLAVIX) 75 MG tablet TAKE ONE TABLET BY MOUTH DAILY WITH BREAKFAST 90 tablet 2   empagliflozin (JARDIANCE) 25 MG TABS tablet Take 1 tablet (25 mg total) by mouth daily before breakfast. 90 tablet 3   Heating Pads PADS Use over the counter heating pads as needed for back pain 1 Units 5   insulin glargine (LANTUS SOLOSTAR) 100 UNIT/ML Solostar Pen Inject 34 Units into the skin daily. 30 mL 3   insulin lispro  (HUMALOG KWIKPEN) 100 UNIT/ML KwikPen Inject 8 Units into the skin 3 (three) times daily. 30 mL 3   Insulin Pen Needle 32G X 4 MM MISC 1 Device by Does not apply route in the morning, at noon, in the evening, and at bedtime. 400 each 3   lisinopril (ZESTRIL) 20 MG tablet TAKE ONE TABLET BY MOUTH DAILY 90 tablet 3   omeprazole (PRILOSEC) 20 MG capsule TAKE 1 CAPSULE BY MOUTH DAILY 90 capsule 0   tadalafil (CIALIS) 5 MG tablet Take 1 tablet (5 mg total) by mouth daily as needed for erectile dysfunction. 30 tablet 0   No current facility-administered medications for this visit.    Family History  Problem Relation Age of Onset   Colon cancer Neg Hx    Esophageal cancer Neg Hx    Pancreatic cancer Neg Hx    Liver cancer Neg Hx    Stomach cancer Neg Hx     Social History   Socioeconomic History   Marital status: Single    Spouse name: Not on file  Number of children: Not on file   Years of education: Not on file   Highest education level: Not on file  Occupational History   Occupation: Unemployed  Tobacco Use   Smoking status: Former    Packs/day: .25    Types: Cigarettes    Quit date: 01/13/2022    Years since quitting: 0.6    Passive exposure: Never   Smokeless tobacco: Former   Tobacco comments:    2 cigarrettes a day  Vaping Use   Vaping Use: Never used  Substance and Sexual Activity   Alcohol use: No    Alcohol/week: 0.0 standard drinks of alcohol   Drug use: No   Sexual activity: Not on file  Other Topics Concern   Not on file  Social History Narrative   Are you right handed or left handed? Right Handed    Are you currently employed ? No work    What is your current occupation?   Do you live at home alone? Yes   Who lives with you? No   What type of home do you live in: 1 story or 2 story? One story home       Social Determinants of Health   Financial Resource Strain: Not on file  Food Insecurity: Not on file  Transportation Needs: Not on file  Physical  Activity: Not on file  Stress: Not on file  Social Connections: Not on file  Intimate Partner Violence: Not on file     REVIEW OF SYSTEMS:   [X]  denotes positive finding, [ ]  denotes negative finding Cardiac  Comments:  Chest pain or chest pressure:    Shortness of breath upon exertion:    Short of breath when lying flat:    Irregular heart rhythm:        Vascular    Pain in calf, thigh, or hip brought on by ambulation:    Pain in feet at night that wakes you up from your sleep:     Blood clot in your veins:    Leg swelling:         Pulmonary    Oxygen at home:    Productive cough:     Wheezing:         Neurologic    Sudden weakness in arms or legs:     Sudden numbness in arms or legs:     Sudden onset of difficulty speaking or slurred speech:    Temporary loss of vision in one eye:     Problems with dizziness:         Gastrointestinal    Blood in stool:     Vomited blood:         Genitourinary    Burning when urinating:     Blood in urine:        Psychiatric    Major depression:         Hematologic    Bleeding problems:    Problems with blood clotting too easily:        Skin    Rashes or ulcers:        Constitutional    Fever or chills:      PHYSICAL EXAMINATION:  Today's Vitals   10/02/22 1131  BP: 123/73  Pulse: 90  Temp: 98.7 F (37.1 C)  TempSrc: Temporal  SpO2: 96%  Weight: 213 lb (96.6 kg)   Body mass index is 32.39 kg/m.   General:  WDWN in NAD; vital signs documented above Gait:  Not observed HENT: WNL, normocephalic Pulmonary: normal non-labored breathing , without wheezing Cardiac: regular HR, without carotid bruits Abdomen: soft, NT Skin: without rashes Vascular Exam/Pulses:  Right Left  Radial 2+ (normal) 2+ (normal)  Femoral 2+ (normal) 1+ (weak)  DP 2+ (normal) absent  PT Brisk doppler Brisk doppler   Extremities: without ischemic changes, without Gangrene , without cellulitis;      Musculoskeletal: no muscle  wasting or atrophy  Neurologic: A&O X 3 Psychiatric:  The pt has Normal affect.   Non-Invasive Vascular Imaging:   ABI's/TBI's on 10/02/2022: Right:  1.04/0.51 - Great toe pressure: 69 Left:  0.54/0.31 - Great toe pressure: 41  Arterial duplex on 10/02/2022: +----------+--------+-----+--------+---------+--------+  RIGHT    PSV cm/sRatioStenosisWaveform Comments  +----------+--------+-----+--------+---------+--------+  CFA Prox  137                  triphasic          +----------+--------+-----+--------+---------+--------+  CFA Distal129                  triphasic          +----------+--------+-----+--------+---------+--------+  SFA Mid   125                  triphasic          +----------+--------+-----+--------+---------+--------+       Right Stent(s): Prox SFA  +---------------+--------+--------+---------+--------+  Prox SFA       PSV cm/sStenosisWaveform Comments  +---------------+--------+--------+---------+--------+  Prox to Stent  133             biphasic           +---------------+--------+--------+---------+--------+  Proximal Stent 140             triphasic          +---------------+--------+--------+---------+--------+  Mid Stent      137             triphasic          +---------------+--------+--------+---------+--------+  Distal Stent   107             triphasic          +---------------+--------+--------+---------+--------+  Distal to Stent138             triphasic          +---------------+--------+--------+---------+--------+   Right Graft #1: SFA to TPT bypass  +------------------+--------+--------+----------+--------+                   PSV cm/sStenosisWaveform  Comments  +------------------+--------+--------+----------+--------+  Inflow           86              triphasic           +------------------+--------+--------+----------+--------+  Prox Anastomosis  109              triphasic           +------------------+--------+--------+----------+--------+  Proximal Graft    103             biphasic            +------------------+--------+--------+----------+--------+  Mid Graft         56              monophasic          +------------------+--------+--------+----------+--------+  Distal Graft      47              monophasicbrisk     +------------------+--------+--------+----------+--------+  Distal Anastomosis36  monophasic          +------------------+--------+--------+----------+--------+  Outflow          59              monophasic          +------------------+--------+--------+----------+--------+    Previous ABI's/TBI's on 04/30/2022: Right:  0.94/0.45 - Great toe pressure: 73 Left:  0.63/0.28 - Great toe pressure:  45  Previous arterial duplex on 04/30/2022: +----------+--------+-----+--------+---------+--------+  RIGHT    PSV cm/sRatioStenosisWaveform Comments  +----------+--------+-----+--------+---------+--------+  CFA Distal121                  triphasic          +----------+--------+-----+--------+---------+--------+  SFA Prox                                stent     +----------+--------+-----+--------+---------+--------+  SFA Mid   113                  triphasic          +----------+--------+-----+--------+---------+--------+  SFA Distal                              graft     +----------+--------+-----+--------+---------+--------+  POP Prox                                graft     +----------+--------+-----+--------+---------+--------+  POP Distal                              graft     +----------+--------+-----+--------+---------+--------+     Right Stent(s):  +------------------+--------+--------+---------+--------+  Origin to prox SFAPSV cm/sStenosisWaveform Comments  +------------------+--------+--------+---------+--------+  Prox to  Stent     129             triphasic          +------------------+--------+--------+---------+--------+  Proximal Stent    128             triphasic          +------------------+--------+--------+---------+--------+  Mid Stent         132             triphasic          +------------------+--------+--------+---------+--------+  Distal Stent      113             triphasic          +------------------+--------+--------+---------+--------+  Distal to Stent   117             triphasic          +------------------+--------+--------+---------+--------+   Right Graft #1: mid SFA to TPT  +------------------+--------+--------+----------+--------+                   PSV cm/sStenosisWaveform  Comments  +------------------+--------+--------+----------+--------+  Inflow           102             biphasic            +------------------+--------+--------+----------+--------+  Prox Anastomosis  82              monophasic          +------------------+--------+--------+----------+--------+  Proximal Graft    55  monophasic          +------------------+--------+--------+----------+--------+  Mid Graft         55              monophasic          +------------------+--------+--------+----------+--------+  Distal Graft      83              biphasic            +------------------+--------+--------+----------+--------+  Distal Anastomosis81              monophasic          +------------------+--------+--------+----------+--------+  Outflow          56              monophasic          +------------------+--------+--------+----------+--------+  Summary:  Right: Patent right proximal SFA stent with no stenosis.  Patent right mid SFA to TPT bypass graft with no stenosis     ASSESSMENT/PLAN:: 47 y.o. male here for follow up for PAD with hx of complicated vascular history with interventions to BLE now with right 3rd toe wound.     -pt with palpable right DP pulse with good toe pressures.  Pt states that his toe wound has been present for about 3 weeks but is improving.  Discussed pt and studies with Dr. Chestine Spore.  Will have the pt return in 2-3 weeks to check his toe to make sure it is improving.  Will also get a podiatry referral for a 2nd opinion for pt.   -Rx for Keflex 500mg  bid x 10 days sent.  Instructed him to clean toe with soap and water and paint with betadine daily.  -continue asa/plavix/statin -fortunately, he is continuing not to smoke.   -pt will f/u in 3 months with ABI, RLE bypass duplex and LLE arterial duplex and right aorto iliac duplex.   Doreatha Massed, [facility] [phone]  Clinic MD:   Edilia Bo

## 2022-10-02 ENCOUNTER — Ambulatory Visit (INDEPENDENT_AMBULATORY_CARE_PROVIDER_SITE_OTHER)
Admission: RE | Admit: 2022-10-02 | Discharge: 2022-10-02 | Disposition: A | Discharge status: Home / Self Care | Payer: Medicaid Other | Source: Ambulatory Visit | Attending: Vascular Surgery | Admitting: Vascular Surgery

## 2022-10-02 ENCOUNTER — Ambulatory Visit: Payer: Medicaid Other | Admitting: Physician Assistant

## 2022-10-02 ENCOUNTER — Ambulatory Visit (HOSPITAL_COMMUNITY)
Admission: RE | Admit: 2022-10-02 | Discharge: 2022-10-02 | Disposition: A | Discharge status: Home / Self Care | Payer: Medicaid Other | Source: Ambulatory Visit | Attending: Vascular Surgery | Admitting: Vascular Surgery

## 2022-10-02 ENCOUNTER — Encounter: Payer: Self-pay | Admitting: Physician Assistant

## 2022-10-02 VITALS — BP 123/73 | HR 90 | Temp 98.7°F | Wt 213.0 lb

## 2022-10-02 DIAGNOSIS — I70435 Atherosclerosis of autologous vein bypass graft(s) of the right leg with ulceration of other part of foot: Secondary | ICD-10-CM | POA: Diagnosis not present

## 2022-10-02 DIAGNOSIS — I739 Peripheral vascular disease, unspecified: Secondary | ICD-10-CM

## 2022-10-02 DIAGNOSIS — I70213 Atherosclerosis of native arteries of extremities with intermittent claudication, bilateral legs: Secondary | ICD-10-CM | POA: Diagnosis not present

## 2022-10-02 LAB — VAS US ABI WITH/WO TBI
Left ABI: 0.54
Right ABI: 1.04

## 2022-10-02 MED ORDER — CEPHALEXIN 500 MG PO CAPS: 500.0000 mg | ORAL_CAPSULE | Freq: Two times a day (BID) | ORAL | 0 refills | Status: DC

## 2022-10-12 ENCOUNTER — Other Ambulatory Visit: Payer: Self-pay

## 2022-10-12 DIAGNOSIS — I70213 Atherosclerosis of native arteries of extremities with intermittent claudication, bilateral legs: Secondary | ICD-10-CM

## 2022-10-12 DIAGNOSIS — I739 Peripheral vascular disease, unspecified: Secondary | ICD-10-CM

## 2022-10-22 ENCOUNTER — Ambulatory Visit: Payer: Medicaid Other | Admitting: Physician Assistant

## 2022-10-22 VITALS — BP 160/118 | HR 88 | Temp 98.2°F | Resp 18 | Ht 68.0 in | Wt 210.8 lb

## 2022-10-22 DIAGNOSIS — I739 Peripheral vascular disease, unspecified: Secondary | ICD-10-CM | POA: Diagnosis not present

## 2022-10-22 NOTE — Progress Notes (Signed)
[facility] [place] HISTORY AND PHYSICAL   [name] who is here today for follow up for PAD.  Pt has hx of   -angiogram with right EIA angioplasty and stent 04/19/2019 Dr. Chestine Spore -right AK popliteal to TPT bypass with reversed GSV 04/22/2019 Dr. Chestine Spore  -angiogram with right EIA angioplasty for in-stent restenosis 09/16/2019 Dr. Chestine Spore -angiogram with right PTA and TPT angioplasty 08/17/2020 Dr. Chestine Spore -angiogram with left BK popliteal, TPT, proximal PTA angioplasty and left SFA angioplasty 08/25/2022 Dr. Chestine Spore -angiogram with right proximal SFA angioplasty with stent and right TPT and PTA angioplasty 12/27/2021 Dr. Chestine Spore   Pt was last seen 09/17/2022 and at that time, he had a new sore on the right 3rd toe.  He was not having any rest pain, claudication.  He stated the sore was originally filled with pus like drainage.  He was keeping it clean.  He had palpable femoral pulses and brisk doppler signals in the right foot.  He was scheduled for non-invasive studies and follow up.     Pt was seen by Dr. Lajoyce Corners 09/24/2022.  Pt was very reluctant to proceed with amputation of the right 3rd toe until follow up with [facility].    The pt returns today for follow up.  He states he has had this toe wound for about 3 weeks.  He tells me that it has gotten better.  He states it was draining but now it is not.  He has been on abx and has finished them but would like another course of abx.  He denies any claudication or rest pain.  He is still not smoking.  He is on asa/statin.  He works as a Curator.    Pt speaks Arabic and translator with pad used for today's visit.     The pt is on a statin for cholesterol management.    The pt is on an aspirin.    Other AC:  Plavix The pt is on ACEI for hypertension.  The pt is  on medication for diabetes. Tobacco hx:  former   History of Present Illness:   Past Medical History:  Diagnosis Date   Depression 01/27/2015   Diabetes mellitus without  complication (HCC)    Hyperlipidemia    Hypertension    Peripheral vascular disease [facility])     Past Surgical History:  Procedure Laterality Date   ABDOMINAL AORTOGRAM W/LOWER EXTREMITY Right 04/19/2019   Procedure: ABDOMINAL AORTOGRAM W/LOWER EXTREMITY;  Surgeon: [name], MD;  [facility]: [facility];  Service: Cardiovascular;  Laterality: Right;   ABDOMINAL AORTOGRAM W/LOWER EXTREMITY Right 09/16/2019   Procedure: ABDOMINAL AORTOGRAM W/LOWER EXTREMITY;  Surgeon: [name], MD;  Location: [facility];  Service: Cardiovascular;  Laterality: Right;   ABDOMINAL AORTOGRAM W/LOWER EXTREMITY N/A 08/17/2020   Procedure: ABDOMINAL AORTOGRAM W/LOWER EXTREMITY;  Surgeon: [name], MD;  Location: [facility];  Service: Cardiovascular;  Laterality: N/A;   ABDOMINAL AORTOGRAM W/LOWER EXTREMITY N/A 12/27/2021   Procedure: ABDOMINAL AORTOGRAM W/LOWER EXTREMITY;  Surgeon: [name], MD;  [facility]: [facility];  Service: Cardiovascular;  Laterality: N/A;   FEMORAL-TIBIAL BYPASS GRAFT Right 04/22/2019   Procedure: Right  FEMORAL-TIBIAL ARTERY BYPASS GRAFT with Harvest of Saphenous vein RIGHT SUPERFICIAL FEMORAL ARTERY TO TIBIOPERONEAL TRUNK BYPASS;  Surgeon: [name], MD;  Location: [facility] OR;  Service: Vascular;  Laterality: Right;   IR CERVICAL/THORACIC DISC ASPIRATION W/IMAG GUIDE  04/15/2019  IR FLUORO GUIDED NEEDLE PLC ASPIRATION/INJECTION LOC  04/15/2019   kidney stones  2009   PERIPHERAL VASCULAR BALLOON ANGIOPLASTY Right 09/16/2019   Procedure: PERIPHERAL VASCULAR BALLOON ANGIOPLASTY;  Surgeon: [name], MD;  [facility];  Service: Cardiovascular;  Laterality: Right;  ext iliac DCB   PERIPHERAL VASCULAR BALLOON ANGIOPLASTY  08/17/2020   Procedure: PERIPHERAL VASCULAR BALLOON ANGIOPLASTY;  Surgeon: [name], MD;  [facility];  Service: Cardiovascular;;  Right TP trunk    PERIPHERAL VASCULAR BALLOON ANGIOPLASTY Left 08/24/2020   Procedure: PERIPHERAL VASCULAR BALLOON ANGIOPLASTY;  Surgeon: [name], MD;  [facility];  Service: Cardiovascular;  Laterality: Left;  Tibioperoneal trunk and superficial femoral arteries.   PERIPHERAL VASCULAR BALLOON ANGIOPLASTY  12/27/2021   Procedure: PERIPHERAL VASCULAR BALLOON ANGIOPLASTY;  Surgeon: [name], MD;  [facility];  Service: Cardiovascular;;   PERIPHERAL VASCULAR INTERVENTION  04/19/2019   Procedure: PERIPHERAL VASCULAR INTERVENTION;  Surgeon: [name], MD;  [facility];  Service: Cardiovascular;;  right external iliac   PERIPHERAL VASCULAR INTERVENTION  12/27/2021   Procedure: PERIPHERAL VASCULAR INTERVENTION;  Surgeon: [name], MD;  [facility];  Service: Cardiovascular;;   TEE WITHOUT CARDIOVERSION N/A 04/19/2019   Procedure: TRANSESOPHAGEAL ECHOCARDIOGRAM (TEE);  Surgeon: [name], MD;  Location[facility] ENDOSCOPY;  Service: Endoscopy;  Laterality: N/A;    ROS:   General:  No weight loss, Fever, chills  HEENT: No recent headaches, no nasal bleeding, no visual changes, no sore throat  Neurologic: No dizziness, blackouts, seizures. No recent symptoms of stroke or mini- stroke. No recent episodes of slurred speech, or temporary blindness.  Cardiac: No recent episodes of chest pain/pressure, no shortness of breath at rest.  No shortness of breath with exertion.  Denies history of atrial fibrillation or irregular heartbeat  Vascular: No history of rest pain in feet.  No history of claudication.  No history of non-healing ulcer, No history of DVT   Pulmonary: No home oxygen, no productive cough, no hemoptysis,  No asthma or wheezing  Musculoskeletal:  [ ]  Arthritis, [ ]  Low back pain,  [ ]  Joint pain  Hematologic:No history of hypercoagulable state.  No history of easy bleeding.  No history of  anemia  Gastrointestinal: No hematochezia or melena,  No gastroesophageal reflux, no trouble swallowing  Urinary: [ ]  chronic Kidney disease, [ ]  on HD - [ ]  MWF or [ ]  TTHS, [ ]  Burning with urination, [ ]  Frequent urination, [ ]  Difficulty urinating;   Skin: No rashes  Psychological: No history of anxiety,  No history of depression  Social History Social History   Tobacco Use   Smoking status: Former    Packs/day: .25    Types: Cigarettes    Quit date: 01/13/2022    Years since quitting: 0.7    Passive exposure: Never   Smokeless tobacco: Former   Tobacco comments:    2 cigarrettes a day  Vaping Use   Vaping Use: Never used  Substance Use Topics   Alcohol use: No    Alcohol/week: 0.0 standard drinks of alcohol   Drug use: No    Family History Family History  Problem Relation Age of Onset   Colon cancer Neg Hx    Esophageal cancer Neg Hx    Pancreatic cancer Neg Hx    Liver cancer Neg Hx    Stomach cancer Neg Hx  Allergies  No Known Allergies   Current Outpatient Medications  Medication Sig Dispense Refill   acetaminophen (TYLENOL) 500 MG tablet Take 500 mg by mouth 2 (two) times daily.     aspirin EC 81 MG tablet Take 81 mg by mouth daily. Swallow whole.     atorvastatin (LIPITOR) 20 MG tablet TAKE 1 TABLET BY MOUTH DAILY 30 tablet 0   cephALEXin (KEFLEX) 500 MG capsule Take 1 capsule (500 mg total) by mouth 2 (two) times daily. (Patient not taking: Reported on 10/22/2022) 20 capsule 0   clopidogrel (PLAVIX) 75 MG tablet TAKE ONE TABLET BY MOUTH DAILY WITH BREAKFAST 90 tablet 2   empagliflozin (JARDIANCE) 25 MG TABS tablet Take 1 tablet (25 mg total) by mouth daily before breakfast. 90 tablet 3   Heating Pads PADS Use over the counter heating pads as needed for back pain 1 Units 5   insulin glargine (LANTUS SOLOSTAR) 100 UNIT/ML Solostar Pen Inject 34 Units into the skin daily. 30 mL 3   insulin lispro (HUMALOG KWIKPEN) 100 UNIT/ML KwikPen Inject 8 Units into  the skin 3 (three) times daily. 30 mL 3   Insulin Pen Needle 32G X 4 MM MISC 1 Device by Does not apply route in the morning, at noon, in the evening, and at bedtime. 400 each 3   lisinopril (ZESTRIL) 20 MG tablet TAKE ONE TABLET BY MOUTH DAILY 90 tablet 3   omeprazole (PRILOSEC) 20 MG capsule TAKE 1 CAPSULE BY MOUTH DAILY 90 capsule 0   tadalafil (CIALIS) 5 MG tablet Take 1 tablet (5 mg total) by mouth daily as needed for erectile dysfunction. 30 tablet 0   No current facility-administered medications for this visit.    Physical Examination  Vitals:   10/22/22 1056  BP: (!) 160/118  Pulse: 88  Resp: 18  Temp: 98.2 F (36.8 C)  TempSrc: Temporal  SpO2: 97%  Weight: 210 lb 12.8 oz (95.6 kg)  Height: 5\' 8"  (1.727 m)    Body mass index is 32.05 kg/m.    The toe tip is healing well.       ASSESSMENT:  [name] who is here today for follow up for PAD.  Pt has hx of   -angiogram with right EIA angioplasty and stent 04/19/2019 Dr. Chestine Spore -right AK popliteal to TPT bypass with reversed GSV 04/22/2019 Dr. Chestine Spore  -angiogram with right EIA angioplasty for in-stent restenosis 09/16/2019 Dr. Chestine Spore -angiogram with right PTA and TPT angioplasty 08/17/2020 Dr. Chestine Spore -angiogram with left BK popliteal, TPT, proximal PTA angioplasty and left SFA angioplasty 08/25/2022 Dr. Chestine Spore -angiogram with right proximal SFA angioplasty with stent and right TPT and PTA angioplasty 12/27/2021 Dr. Chestine Spore   Pt was last seen 09/17/2022 and at that time, he had a new sore on the right 3rd toe.   PLAN: He will f/u for repeat studies and surveillance in 3 months.  If the toe does not continue to heal he will call our office he is maximally medically maximized.     Mosetta Pigeon PA-C [facility] [place] Office: [phone]  MD in clinic [place]

## 2022-10-29 ENCOUNTER — Encounter (HOSPITAL_COMMUNITY): Payer: Medicaid Other

## 2022-10-29 ENCOUNTER — Ambulatory Visit: Payer: Medicaid Other

## 2022-11-07 ENCOUNTER — Encounter: Payer: Medicaid Other | Attending: Internal Medicine | Admitting: Dietician

## 2022-11-07 ENCOUNTER — Other Ambulatory Visit: Payer: Self-pay | Admitting: Internal Medicine

## 2022-11-07 ENCOUNTER — Encounter: Payer: Self-pay | Admitting: Dietician

## 2022-11-07 VITALS — Wt 214.0 lb

## 2022-11-07 DIAGNOSIS — Z794 Long term (current) use of insulin: Secondary | ICD-10-CM | POA: Insufficient documentation

## 2022-11-07 DIAGNOSIS — E1129 Type 2 diabetes mellitus with other diabetic kidney complication: Secondary | ICD-10-CM

## 2022-11-07 DIAGNOSIS — E1159 Type 2 diabetes mellitus with other circulatory complications: Secondary | ICD-10-CM | POA: Insufficient documentation

## 2022-11-07 MED ORDER — DEXCOM G7 SENSOR MISC: 1.0000 | 3 refills | Status: DC

## 2022-11-07 NOTE — Progress Notes (Signed)
Diabetes Self-Management Education  Visit Type: Follow-up  Appt. Start Time: 0905 Appt. End Time: 0935  11/07/2022  Thomas [name], identified by name and date of birth, is a 47 y.o. male with a diagnosis of Diabetes:  .   ASSESSMENT Patient is here today with his interpretor (Aerobic)  Leda Min from [employer].  He was last seen by this RD on 05/09/2022.  MD message myself after this visit wishing for patient to retry the newest Dexcom G7 CGM.  He struggled understanding the Dexcom G6 but the new product is simpler.  Called patient with an interpretor and he has agreed to retry the new CGM.  MD order sensor and patient will come in tomorrow for training.  He is continuing to due intermittent fasting. He has lost another 10 lbs in the past 6 months. Fasting blood glucose 85-112 and usually < 100.   Occasional low blood glucose symptoms.  Tests and blood glucose is 72.  Treats with a small amount of dark chocolate. Walks walks everyday in [place]. He is not rotating insulin injection sites well.  Weight hx: 68" 214 lbs 11/07/2022 224 lbs 05/09/2022 245 lbs 01/11/2022   History includes Type 2 Diabetes (2008), HTN, dyslipidemia, PVD, PVD, depression, dentures Smokes, eats 1-2 meals per day. Labs: 6.5% 03/04/2022 decreased from A1C 7.5% 08/31/2021 and Cholesterol 191, HDL 36, LDL 113, Triglycerides 219, A1c 8.6%, GFR 92 241 lbs 11/2020 Medication includes Lantus 30 units each am, Humalog 8 units before each meal, Jardiance   Patient lives alone.  He is from [place]. Patient states that he has not worked since 2020 due to health and injuries.  He states that not working has caused him depression.  He does feel alone as his friends had to leave as he cannot support them. He is not on disability. He does receive food stamps. Walk:  Walks 30-60 minutes daily  Inside at [place] or outside on nice days.  Weight 214 lb (97.1 kg). Body mass index is 32.54  kg/m.   Diabetes Self-Management Education - 11/07/22 1352       Visit Information   Visit Type Follow-up      Psychosocial Assessment   Self-care barriers English as a second language    Self-management support Doctor's office;CDE visits    Other persons present Patient    Patient Concerns Nutrition/Meal planning    Special Needs None    Preferred Learning Style No preference indicated    Learning Readiness Ready    How often do you need to have someone help you when you read instructions, pamphlets, or other written materials from your doctor or pharmacy? 1 - Never      Pre-Education Assessment   Patient understands the diabetes disease and treatment process. Comprehends key points    Patient understands incorporating nutritional management into lifestyle. Needs Review    Patient undertands incorporating physical activity into lifestyle. Comprehends key points    Patient understands using medications safely. Needs Review    Patient understands monitoring blood glucose, interpreting and using results Comprehends key points    Patient understands prevention, detection, and treatment of acute complications. Comprehends key points    Patient understands prevention, detection, and treatment of chronic complications. Compreheands key points    Patient understands how to develop strategies to address psychosocial issues. Comprehends key points    Patient understands how to develop strategies to promote health/change behavior. Comprehends key points      Complications  Last HgB A1C per patient/outside source 6.5 %   02/2022   How often do you check your blood sugar? 1-2 times/day    Fasting Blood glucose range (mg/dL) 16-109      Dietary Intake   Breakfast 3 hard boiled eggs    Lunch 3 cups rice, 8 oz tilapia   3 pm - discussed portion size   Dinner none    Snack (evening) apple    Beverage(s) water      Activity / Exercise   Activity / Exercise Type Light (walking / raking  leaves)    How many days per week do you exercise? 7    How many minutes per day do you exercise? 20    Total minutes per week of exercise 140      Patient Education   Previous Diabetes Education Yes (please comment)   04/2022   Healthy Eating Meal options for control of blood glucose level and chronic complications.   portion control, balanced meals, vegetables   Being Active Role of exercise on diabetes management, blood pressure control and cardiac health.    Medications Reviewed patients medication for diabetes, action, purpose, timing of dose and side effects.      Individualized Goals (developed by patient)   Nutrition General guidelines for healthy choices and portions discussed    Physical Activity Exercise 5-7 days per week;30 minutes per day    Medications take my medication as prescribed    Monitoring  Consistenly use CGM    Problem Solving Eating Pattern;Addressing barriers to behavior change    Reducing Risk examine blood glucose patterns;do foot checks daily;treat hypoglycemia with 15 grams of carbs if blood glucose less than 70mg /dL      Patient Self-Evaluation of Goals - Patient rates self as meeting previously set goals (% of time)   Nutrition >75% (most of the time)    Physical Activity >75% (most of the time)    Medications >75% (most of the time)    Monitoring >75% (most of the time)    Problem Solving and behavior change strategies  >75% (most of the time)    Reducing Risk (treating acute and chronic complications) >75% (most of the time)    Health Coping >75% (most of the time)      Post-Education Assessment   Patient understands the diabetes disease and treatment process. Comprehends key points    Patient understands incorporating nutritional management into lifestyle. Needs Review    Patient undertands incorporating physical activity into lifestyle. Comprehends key points    Patient understands using medications safely. Comphrehends key points    Patient  understands monitoring blood glucose, interpreting and using results Comprehends key points    Patient understands prevention, detection, and treatment of acute complications. Comprehends key points    Patient understands prevention, detection, and treatment of chronic complications. Comprehends key points    Patient understands how to develop strategies to address psychosocial issues. Comprehends key points    Patient understands how to develop strategies to promote health/change behavior. Comprehends key points      Outcomes   Expected Outcomes Demonstrated interest in learning. Expect positive outcomes    Future DMSE 6 months    Program Status Not Completed      Subsequent Visit   Since your last visit have you continued or begun to take your medications as prescribed? Yes    Since your last visit have you experienced any weight changes? Loss    Weight Loss (lbs) 10  Since your last visit, are you checking your blood glucose at least once a day? Yes             Individualized Plan for Diabetes Self-Management Training:   Learning Objective:  Patient will have a greater understanding of diabetes self-management. Patient education plan is to attend individual and/or group sessions per assessed needs and concerns.   Plan:   Patient Instructions  Continue to stay active daily Continue to take your medicine Continue to be mindful about your portion or carbohydrates   Expected Outcomes:  Demonstrated interest in learning. Expect positive outcomes  Education material provided:   If problems or questions, patient to contact team via:  Phone  Future DSME appointment: 6 months

## 2022-11-07 NOTE — Patient Instructions (Signed)
Continue to stay active daily Continue to take your medicine Continue to be mindful about your portion or carbohydrates

## 2022-11-08 ENCOUNTER — Encounter: Payer: Medicaid Other | Admitting: Dietician

## 2022-11-08 DIAGNOSIS — Z794 Long term (current) use of insulin: Secondary | ICD-10-CM

## 2022-11-08 NOTE — Progress Notes (Signed)
Dexcom G7 Personal CGM Training  Start time:1015    End time: 1045 Total time: 30 Phelps Eiche was educated about the following:  -Getting to know device    (Phone programmed ) -Setting up device (high alert  250, low alert 70  ) -Inserting sensor (Left upper arm  WNL) -Calibrating- none required for G6 -Ending sensor session -Trouble shooting -Tape guide, clarity information  -Reviewed insulin dosing from dexcom.  Recommendation for waterproof patches for G7 if he is to be in a pool longer than 30 minutes  Patient has [facility] tech support and my contact information.  Follow up in 4 weeks.  Jeoffrey Massed, RD 11/08/2022 2:22 PM.

## 2022-11-11 ENCOUNTER — Ambulatory Visit: Payer: Medicaid Other

## 2022-11-11 ENCOUNTER — Encounter (HOSPITAL_COMMUNITY): Payer: Medicaid Other

## 2022-11-14 ENCOUNTER — Ambulatory Visit: Payer: Medicaid Other | Admitting: Podiatry

## 2022-11-14 ENCOUNTER — Ambulatory Visit: Payer: Medicaid Other | Admitting: Dietician

## 2022-11-14 ENCOUNTER — Ambulatory Visit (INDEPENDENT_AMBULATORY_CARE_PROVIDER_SITE_OTHER): Payer: Medicaid Other

## 2022-11-14 ENCOUNTER — Telehealth: Payer: Self-pay

## 2022-11-14 ENCOUNTER — Encounter: Payer: Self-pay | Admitting: Podiatry

## 2022-11-14 DIAGNOSIS — M869 Osteomyelitis, unspecified: Secondary | ICD-10-CM | POA: Diagnosis not present

## 2022-11-14 DIAGNOSIS — M86171 Other acute osteomyelitis, right ankle and foot: Secondary | ICD-10-CM

## 2022-11-14 MED ORDER — DOXYCYCLINE HYCLATE 100 MG PO TABS
100.0000 mg | ORAL_TABLET | Freq: Two times a day (BID) | ORAL | 0 refills | Status: DC
Start: 2022-11-14 — End: 2023-08-14

## 2022-11-14 MED ORDER — SILVER SULFADIAZINE 1 % EX CREA: 1.0000 | TOPICAL_CREAM | Freq: Every day | CUTANEOUS | 0 refills | Status: DC

## 2022-11-14 NOTE — Progress Notes (Signed)
Subjective:   Patient ID: Thomas [name], male   DOB: 47 y.o.   MRN: [id number]   HPI Chief Complaint  Patient presents with   right foot toe nail concern    47 year old male presents the office today with concern of infection of the right third toe, swelling.  He was referred by vascular surgery.  Patient states he has not had any recent treatment.  Per the chart reports that he was seen by Dr. Lajoyce Corners on September 24, 2022.  He does not report any current antibiotic use.  Does not report any fevers chills.  Translator utilized via tablet.   Review of Systems  All other systems reviewed and are negative.  Past Medical History:  Diagnosis Date   Depression 01/27/2015   Diabetes mellitus without complication (HCC)    Hyperlipidemia    Hypertension    Peripheral vascular disease [facility])     Past Surgical History:  Procedure Laterality Date   ABDOMINAL AORTOGRAM W/LOWER EXTREMITY Right 04/19/2019   Procedure: ABDOMINAL AORTOGRAM W/LOWER EXTREMITY;  Surgeon: [name], MD;  Location: [facility];  Service: Cardiovascular;  Laterality: Right;   ABDOMINAL AORTOGRAM W/LOWER EXTREMITY Right 09/16/2019   Procedure: ABDOMINAL AORTOGRAM W/LOWER EXTREMITY;  Surgeon: [name], MD;  Location: [facility];  Service: Cardiovascular;  Laterality: Right;   ABDOMINAL AORTOGRAM W/LOWER EXTREMITY N/A 08/17/2020   Procedure: ABDOMINAL AORTOGRAM W/LOWER EXTREMITY;  Surgeon: [name], MD;  [facility]: [facility];  Service: Cardiovascular;  Laterality: N/A;   ABDOMINAL AORTOGRAM W/LOWER EXTREMITY N/A 12/27/2021   Procedure: ABDOMINAL AORTOGRAM W/LOWER EXTREMITY;  Surgeon: [name], MD;  [facility]: [facility];  Service: Cardiovascular;  Laterality: N/A;   FEMORAL-TIBIAL BYPASS GRAFT Right 04/22/2019   Procedure: Right  FEMORAL-TIBIAL ARTERY BYPASS GRAFT with Harvest of Saphenous vein RIGHT SUPERFICIAL FEMORAL ARTERY TO TIBIOPERONEAL TRUNK BYPASS;   Surgeon: [name], MD;  Location: [facility];  Service: Vascular;  Laterality: Right;   IR CERVICAL/THORACIC DISC ASPIRATION W/IMAG GUIDE  04/15/2019   IR FLUORO GUIDED NEEDLE PLC ASPIRATION/INJECTION LOC  04/15/2019   kidney stones  2009   PERIPHERAL VASCULAR BALLOON ANGIOPLASTY Right 09/16/2019   Procedure: PERIPHERAL VASCULAR BALLOON ANGIOPLASTY;  Surgeon: [name], MD;  [facility]: [facility];  Service: Cardiovascular;  Laterality: Right;  ext iliac DCB   PERIPHERAL VASCULAR BALLOON ANGIOPLASTY  08/17/2020   Procedure: PERIPHERAL VASCULAR BALLOON ANGIOPLASTY;  Surgeon: [name], MD;  [facility]: [facility];  Service: Cardiovascular;;  Right TP trunk   PERIPHERAL VASCULAR BALLOON ANGIOPLASTY Left 08/24/2020   Procedure: PERIPHERAL VASCULAR BALLOON ANGIOPLASTY;  Surgeon: [name], MD;  [facility];  Service: Cardiovascular;  Laterality: Left;  Tibioperoneal trunk and superficial femoral arteries.   PERIPHERAL VASCULAR BALLOON ANGIOPLASTY  12/27/2021   Procedure: PERIPHERAL VASCULAR BALLOON ANGIOPLASTY;  Surgeon: [name], MD;  [facility];  Service: Cardiovascular;;   PERIPHERAL VASCULAR INTERVENTION  04/19/2019   Procedure: PERIPHERAL VASCULAR INTERVENTION;  Surgeon: [name], MD;  [facility]: [facility];  Service: Cardiovascular;;  right external iliac   PERIPHERAL VASCULAR INTERVENTION  12/27/2021   Procedure: PERIPHERAL VASCULAR INTERVENTION;  Surgeon: [name], MD;  [facility];  Service: Cardiovascular;;   TEE WITHOUT CARDIOVERSION N/A 04/19/2019   Procedure: TRANSESOPHAGEAL ECHOCARDIOGRAM (TEE);  Surgeon: [name], MD;  Location: [facility] ENDOSCOPY;  Service: Endoscopy;  Laterality: N/A;  Current Outpatient Medications:    doxycycline (VIBRA-TABS) 100 MG tablet, Take 1 tablet (100 mg total) by mouth 2 (two) times daily., Disp: 20 tablet,  Rfl: 0   silver sulfADIAZINE (SILVADENE) 1 % cream, Apply 1 Application topically daily., Disp: 50 g, Rfl: 0   acetaminophen (TYLENOL) 500 MG tablet, Take 500 mg by mouth 2 (two) times daily., Disp: , Rfl:    aspirin EC 81 MG tablet, Take 81 mg by mouth daily. Swallow whole., Disp: , Rfl:    atorvastatin (LIPITOR) 20 MG tablet, TAKE 1 TABLET BY MOUTH DAILY, Disp: 30 tablet, Rfl: 0   cephALEXin (KEFLEX) 500 MG capsule, Take 1 capsule (500 mg total) by mouth 2 (two) times daily. (Patient not taking: Reported on 10/22/2022), Disp: 20 capsule, Rfl: 0   clopidogrel (PLAVIX) 75 MG tablet, TAKE ONE TABLET BY MOUTH DAILY WITH BREAKFAST, Disp: 90 tablet, Rfl: 2   Continuous Glucose Sensor (DEXCOM G7 SENSOR) MISC, 1 Device by Does not apply route as directed., Disp: 9 each, Rfl: 3   empagliflozin (JARDIANCE) 25 MG TABS tablet, Take 1 tablet (25 mg total) by mouth daily before breakfast., Disp: 90 tablet, Rfl: 3   Heating Pads PADS, Use over the counter heating pads as needed for back pain, Disp: 1 Units, Rfl: 5   insulin glargine (LANTUS SOLOSTAR) 100 UNIT/ML Solostar Pen, Inject 34 Units into the skin daily., Disp: 30 mL, Rfl: 3   insulin lispro (HUMALOG KWIKPEN) 100 UNIT/ML KwikPen, Inject 8 Units into the skin 3 (three) times daily., Disp: 30 mL, Rfl: 3   Insulin Pen Needle 32G X 4 MM MISC, 1 Device by Does not apply route in the morning, at noon, in the evening, and at bedtime., Disp: 400 each, Rfl: 3   lisinopril (ZESTRIL) 20 MG tablet, TAKE ONE TABLET BY MOUTH DAILY, Disp: 90 tablet, Rfl: 3   omeprazole (PRILOSEC) 20 MG capsule, TAKE 1 CAPSULE BY MOUTH DAILY, Disp: 90 capsule, Rfl: 0   tadalafil (CIALIS) 5 MG tablet, Take 1 tablet (5 mg total) by mouth daily as needed for erectile dysfunction., Disp: 30 tablet, Rfl: 0  No Known Allergies         Objective:  Physical Exam  General: AAO x3, NAD  Dermatological: Ulceration with superficial distal aspect of the right third toe there is edema  localized erythema to the distal portions of the injury past.  There is no is any cellulitis.  There is no fluctuation or crepitus or erythema.  Vascular: Appropriately warm and well-perfused.  Neruologic: Sensation decreased  Musculoskeletal: Digital contracture.  No pain on exam.     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  Reviewed with her team.  No evidence of acute fracture.  There is cortical destruction of the distal aspect of the third toe consistent with osteomyelitis. -I discussed with the patient both conservative and surgical options.  I do think his best option is to proceed with amputation of the digit. He does seem somewhat hesitant to this.  Discussed risks of surgery. -Doxycyline for now -Sublingual continues daily. -Monitor for any clinical signs or symptoms of infection and directed to call the office immediately should any occur or go to the ER.  Return for toe infection 1-2 weeks.  Vivi Barrack DPM

## 2022-11-14 NOTE — Telephone Encounter (Signed)
Dexcom sensors need PA  

## 2022-11-14 NOTE — Progress Notes (Signed)
Toe nail concern Was on antibiotc 1 month ago

## 2022-11-19 ENCOUNTER — Other Ambulatory Visit (HOSPITAL_COMMUNITY): Payer: Self-pay

## 2022-11-19 ENCOUNTER — Telehealth: Payer: Self-pay | Admitting: Pharmacy Technician

## 2022-11-19 NOTE — Telephone Encounter (Signed)
Pharmacy Patient Advocate Encounter   Received notification from Pt Calls Messages that prior authorization for Dexcom G7 is required/requested.   Insurance verification completed.   The patient is insured through   [employer]  .   Per test claim: PA required; PA submitted to  [employer] [facility] via CoverMyMeds Key/confirmation #/EOC [id number] Status is pending

## 2022-11-21 NOTE — Telephone Encounter (Signed)
Pharmacy Patient Advocate Encounter  Received notification from  [facility]  that Prior Authorization for Dexcom G7 sensors has been DENIED. Please advise how you'd like to proceed. Full denial letter will be uploaded to the media tab. See denial reason below.  [employer] reviewed your [facility] G7 SENSOR request for the above-identified member,  and it is denied for the following reason: because we did not see what we need to approve the  device you asked for, (Dexcom G7 sensor). We may be able to approve this device in certain  situations (when you or your caregiver[s] is willing and able to use the therapeutic continuous  glucose monitoring system as prescribed and you have had a face-to-face encounter with your  treating practitioner within six months of the initial authorization request to evaluate your  glycemic control and determine that you have insulin-dependent diabetes, and you or your  caregiver[s] are/is willing and able to use the therapeutic continuous glucose monitoring  system as prescribed). Looks like it's b/c her office visit was more than 6 months ago.  PA #/Case ID/Reference #: PA Case: [id number]

## 2022-12-02 ENCOUNTER — Ambulatory Visit: Payer: Medicaid Other | Admitting: Podiatry

## 2022-12-02 DIAGNOSIS — M86171 Other acute osteomyelitis, right ankle and foot: Secondary | ICD-10-CM

## 2022-12-02 DIAGNOSIS — M869 Osteomyelitis, unspecified: Secondary | ICD-10-CM

## 2022-12-02 DIAGNOSIS — M86071 Acute hematogenous osteomyelitis, right ankle and foot: Secondary | ICD-10-CM | POA: Diagnosis not present

## 2022-12-02 MED ORDER — DOXYCYCLINE HYCLATE 100 MG PO TABS
100.0000 mg | ORAL_TABLET | Freq: Two times a day (BID) | ORAL | 0 refills | Status: DC
Start: 2022-12-02 — End: 2022-12-25

## 2022-12-03 NOTE — Progress Notes (Signed)
Subjective:   Patient ID: Thomas [name], male   DOB: 47 y.o.   MRN: [id number]   HPI Chief Complaint  Patient presents with   Osteomyelitis of third toe of right foot     47 year old male presents the office today with concern of infection of the right third toe, swelling.  He states he is doing better his toe is well.  Does not report any fever or chills.  Today.  No other concerns today.   Translator present.      Objective:  Physical Exam  General: AAO x3, NAD  Dermatological: Keratotic tissue at the distal aspect of the toe with underlying ulceration with superficial distal aspect of the right third toe there is edema localized erythema to the distal portions of the digit.  There is no ascending cellulitis.  No fluctuation or crepitation.  There is no drainage or pus.  There is no current event today.  Vascular: Appropriately warm and well-perfused.  Neruologic: Sensation decreased  Musculoskeletal: Digital contracture.  No pain on exam.     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  I again discussed with him conservative treatment versus surgical intervention which would require amputation of the loose part of the toe.  Amputation is my recommendation. Discussed with Dr. Chestine Spore as well and he is being followed by them and should have adequate circulation to heal. He states he like to give it 2 more weeks before making a decision as he thinks is doing better.  Discussed with him the longer infection is present it can spread.  If there is any changes in his report appears emergency room.  Will refill antibiotics.  Continue limited weightbearing in surgical shoe. -Monitor for any clinical signs  or symptoms of infection and directed to call the office immediately should any occur or go to the ER.  Return in about 2 weeks (around 12/16/2022) for toe infection; x-ray right foot .  Vivi Barrack DPM

## 2022-12-05 ENCOUNTER — Encounter: Payer: Self-pay | Admitting: Dietician

## 2022-12-05 ENCOUNTER — Encounter: Payer: Medicaid Other | Attending: Internal Medicine | Admitting: Dietician

## 2022-12-05 ENCOUNTER — Other Ambulatory Visit: Payer: Self-pay | Admitting: Internal Medicine

## 2022-12-05 DIAGNOSIS — Z794 Long term (current) use of insulin: Secondary | ICD-10-CM | POA: Diagnosis not present

## 2022-12-05 DIAGNOSIS — E1159 Type 2 diabetes mellitus with other circulatory complications: Secondary | ICD-10-CM | POA: Diagnosis not present

## 2022-12-05 NOTE — Patient Instructions (Signed)
Keep up your good habits!  Continue to walk  Be mindful of your portion sizes Avoid sugar in your tea Find ways to cope with stress  Exercise  Pray  Enjoy nature Use the Dexcom when your insurance covers this.  I have sent a request for the prior authorization to the medical assistants here.

## 2022-12-05 NOTE — Progress Notes (Signed)
Diabetes Self-Management Education  Visit Type: Follow-up  Appt. Start Time: 1305 Appt. End Time: 1340  12/05/2022  [name], identified by name and date of birth, is a 47 y.o. male with a diagnosis of Diabetes:  .   ASSESSMENT Patient is here today with an Arabic Field seismologist.from [facility]. He has a wound in his toe (osteomyelitis).  He is now on an antibiotic and MD care. He is not aware of any new A1C and I cannot find this in my system. He states that insurance did not approve of the Dexcom G7 and needs a prior authorization.  Request for this sent to the [facility] Clinical team. He did not check his blood glucose today.  Blood glucose in office was 117. He quit smoking a year ago but states that the he has been tempted to return to smoking due to the increased stress regarding his foot.  He asks if there is a medication that he could have that would help.  He states that he has been on an antidepressant in the past and did not like it.  Discussed coping mechanisms with patient such as exercise, prayer, speaking with friends.  Patient is aware of increased risk of smoking again.  He states that his friend vapes.  Discussed that this is also not recommended and unhealthy.  He is currently coping by eating more food.  Discussed.   Weight hx: 68" 213 lbs 12/05/2022 214 lbs 11/07/2022 224 lbs 05/09/2022 245 lbs 01/11/2022   History includes Type 2 Diabetes (2008), HTN, dyslipidemia, PVD, PVD, depression, dentures Smokes, eats 1-2 meals per day. Labs: 6.5% 03/04/2022 decreased from A1C 7.5% 08/31/2021 and Cholesterol 191, HDL 36, LDL 113, Triglycerides 219, A1c 8.6%, GFR 92 241 lbs 11/2020 Medication includes Lantus 30 units each am, Humalog 7-8 units before each meal, Jardiance CGM:  Dexcom G7 (currently not wearing as his insurance needs a prior authorization.  CGM Results from download:   % Time CGM active:   79 %   (Goal >70%)  Average glucose:   116 mg/dL for  14 days  Glucose management indicator:   N/A  Time in range (70-180 mg/dL):   89 %   (Goal >16%)  Time High (181-250 mg/dL):   7 %   (Goal < 10%)  Time Very High (>250 mg/dL):    0 %   (Goal < 5%)  Time Low (54-69 mg/dL):   4 %   (Goal <9%)  Time Very Low (<54 mg/dL):   <1 %   (Goal <6%)  %CV (glucose variability)    36   Patient lives alone.  He is from [place]. Patient states that he has not worked since 2020 due to health and injuries.  He states that not working has caused him depression.  He does feel alone as his friends had to leave as he cannot support them. He is not on disability. He does receive food stamps. Walk:  Walks 30-60 minutes daily  Inside at [place] or outside on nice days.  Diabetes Self-Management Education - 12/05/22 1400       Visit Information   Visit Type Follow-up      Psychosocial Assessment   Patient Belief/Attitude about Diabetes Afraid    What is the hardest part about your diabetes right now, causing you the most concern, or is the most worrisome to you about your diabetes?   Other (comment)   toe problem   Self-care barriers English as a second language  Self-management support Doctor's office;CDE visits    Other persons present Patient;Interpreter    Patient Concerns Nutrition/Meal planning;Problem Solving;Support    Special Needs None    Preferred Learning Style No preference indicated    Learning Readiness Ready    How often do you need to have someone help you when you read instructions, pamphlets, or other written materials from your doctor or pharmacy? 1 - Never      Pre-Education Assessment   Patient understands the diabetes disease and treatment process. Comprehends key points    Patient understands incorporating nutritional management into lifestyle. Needs Review    Patient undertands incorporating physical activity into lifestyle. Comprehends key points    Patient understands using medications safely. Comprehends key points    Patient  understands monitoring blood glucose, interpreting and using results Comprehends key points    Patient understands prevention, detection, and treatment of acute complications. Comprehends key points    Patient understands prevention, detection, and treatment of chronic complications. Compreheands key points    Patient understands how to develop strategies to address psychosocial issues. Needs Review    Patient understands how to develop strategies to promote health/change behavior. Needs Review      Complications   How often do you check your blood sugar? 1-2 times/day    Fasting Blood glucose range (mg/dL) 53-664    Number of hypoglycemic episodes per month 4    Can you tell when your blood sugar is low? Yes    What do you do if your blood sugar is low? chocolate    Number of hyperglycemic episodes ( >200mg /dL): Weekly      Dietary Intake   Breakfast 3 boiled eggs, banana    Lunch eggplant, zucchini, tomatoes, meat, potatoes, rice    Snack (afternoon) none    Dinner none    Snack (evening) none    Beverage(s) water, black tea with 2 heaping tsp sugar      Activity / Exercise   Activity / Exercise Type Light (walking / raking leaves)    How many days per week do you exercise? 7    How many minutes per day do you exercise? 30    Total minutes per week of exercise 210      Patient Education   Previous Diabetes Education Yes (please comment)   10/2022   Healthy Eating Meal options for control of blood glucose level and chronic complications.    Being Active Role of exercise on diabetes management, blood pressure control and cardiac health.    Medications Reviewed patients medication for diabetes, action, purpose, timing of dose and side effects.    Monitoring Taught/evaluated CGM (comment)    Diabetes Stress and Support Identified and addressed patients feelings and concerns about diabetes;Worked with patient to identify barriers to care and solutions;Brainstormed with patient on coping  mechanisms for social situations, getting support from significant others, dealing with feelings about diabetes      Individualized Goals (developed by patient)   Nutrition General guidelines for healthy choices and portions discussed    Physical Activity Exercise 5-7 days per week;30 minutes per day    Medications take my medication as prescribed    Monitoring  Consistenly use CGM    Problem Solving Eating Pattern    Reducing Risk examine blood glucose patterns      Patient Self-Evaluation of Goals - Patient rates self as meeting previously set goals (% of time)   Nutrition >75% (most of the time)  Physical Activity >75% (most of the time)    Medications >75% (most of the time)    Monitoring >75% (most of the time)    Problem Solving and behavior change strategies  >75% (most of the time)    Reducing Risk (treating acute and chronic complications) >75% (most of the time)    Health Coping >75% (most of the time)      Post-Education Assessment   Patient understands the diabetes disease and treatment process. Comprehends key points    Patient understands incorporating nutritional management into lifestyle. Needs Review    Patient undertands incorporating physical activity into lifestyle. Demonstrates understanding / competency    Patient understands using medications safely. Demonstrates understanding / competency    Patient understands monitoring blood glucose, interpreting and using results Demonstrates understanding / competency    Patient understands prevention, detection, and treatment of acute complications. Demonstrates understanding / competency    Patient understands prevention, detection, and treatment of chronic complications. Comprehends key points    Patient understands how to develop strategies to address psychosocial issues. Comprehends key points    Patient understands how to develop strategies to promote health/change behavior. Comprehends key points      Outcomes    Expected Outcomes Demonstrated interest in learning. Expect positive outcomes    Future DMSE 3-4 months    Program Status Not Completed      Subsequent Visit   Since your last visit have you continued or begun to take your medications as prescribed? Yes    Since your last visit have you experienced any weight changes? Loss    Weight Loss (lbs) 1    Since your last visit, are you checking your blood glucose at least once a day? Yes             Individualized Plan for Diabetes Self-Management Training:   Learning Objective:  Patient will have a greater understanding of diabetes self-management. Patient education plan is to attend individual and/or group sessions per assessed needs and concerns.   Plan:   Patient Instructions  Keep up your good habits!  Continue to walk  Be mindful of your portion sizes Avoid sugar in your tea Find ways to cope with stress  Exercise  Pray  Enjoy nature Use the Dexcom when your insurance covers this.  I have sent a request for the prior authorization to the medical assistants here.    Expected Outcomes:  Demonstrated interest in learning. Expect positive outcomes  Education material provided:   If problems or questions, patient to contact team via:  Phone  Future DSME appointment: 3-4 months

## 2022-12-19 ENCOUNTER — Telehealth: Payer: Self-pay | Admitting: Medical

## 2022-12-19 MED ORDER — LANTUS SOLOSTAR 100 UNIT/ML ~~LOC~~ SOPN: 34.0000 [IU] | PEN_INJECTOR | Freq: Every day | SUBCUTANEOUS | 0 refills | Status: DC

## 2022-12-19 MED ORDER — INSULIN LISPRO (1 UNIT DIAL) 100 UNIT/ML (KWIKPEN): 8.0000 [IU] | PEN_INJECTOR | Freq: Three times a day (TID) | SUBCUTANEOUS | 0 refills | Status: DC

## 2022-12-19 NOTE — Telephone Encounter (Signed)
Done

## 2022-12-19 NOTE — Telephone Encounter (Signed)
Pt came in office stating is needing refill for insulin lispro (HUMALOG KWIKPEN) 100 UNIT/ML KwikPen  and also insulin glargine (LANTUS SOLOSTAR) 100 UNIT/ML Solostar Pen sent to [facility] [phone] [address], [place], [place] [address]. Pt is needing meds sooner, pt has an appt with PCP on Sept 10, for a cpe. Please advise.

## 2022-12-23 ENCOUNTER — Ambulatory Visit (INDEPENDENT_AMBULATORY_CARE_PROVIDER_SITE_OTHER): Payer: Medicaid Other

## 2022-12-23 ENCOUNTER — Ambulatory Visit: Payer: Medicaid Other | Admitting: Podiatry

## 2022-12-23 DIAGNOSIS — M86171 Other acute osteomyelitis, right ankle and foot: Secondary | ICD-10-CM

## 2022-12-23 NOTE — Progress Notes (Signed)
  Subjective:   Patient ID: Thomas [name], male   DOB: 47 y.o.   MRN: [id number]   HPI Chief Complaint  Patient presents with   Acute osteomyelitis of right foot     47 year old male presents the office today with concern of infection of the right third toe, swelling.  He states that he is doing well he has no issues.  He thinks his toe is healed.  Does not report any drainage or pus.  No fever or chills.  Spoke to the patient via translator [phone]      Objective:  Physical Exam  General: AAO x3, NAD  Dermatological: Minimal callus formation at distal aspect of the third toe without any drainage or pus.  Still some localized edema without any ascending cellulitis.  There is no drainage or pus.  There is no fluctuation or crepitation.  There is no malodor.  Vascular: Appropriately warm and well-perfused.  Neruologic: Sensation decreased  Musculoskeletal: Digital contracture.  No pain on exam.     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  There is cortical changes along the distal phalanx.  Somewhat stable compared to previous x-rays. -This patient's course of antibiotics and toe continues to heal and remained stable.  I had a long discussion with him via translator in regards to the conservative as well as surgical options.  I recommended amputation of the toe but he does not want to proceed with this.  We discussed holding off on further antibiotics and observing off antibiotics.  There is any changes that occur or any worsening to me know and we will restart them. He seemed to understand the plan he did not have any further questions or concerns.  Return in about 4 weeks (around 01/20/2023) for infection, x-ray.  Vivi Barrack DPM

## 2022-12-24 ENCOUNTER — Telehealth: Payer: Self-pay | Admitting: Medical

## 2022-12-24 ENCOUNTER — Ambulatory Visit (INDEPENDENT_AMBULATORY_CARE_PROVIDER_SITE_OTHER): Payer: Medicaid Other | Admitting: Medical

## 2022-12-24 VITALS — BP 128/84 | HR 89 | Resp 18 | Ht 68.0 in | Wt 211.6 lb

## 2022-12-24 DIAGNOSIS — I10 Essential (primary) hypertension: Secondary | ICD-10-CM | POA: Diagnosis not present

## 2022-12-24 DIAGNOSIS — Z0000 Encounter for general adult medical examination without abnormal findings: Secondary | ICD-10-CM | POA: Diagnosis not present

## 2022-12-24 DIAGNOSIS — E1065 Type 1 diabetes mellitus with hyperglycemia: Secondary | ICD-10-CM

## 2022-12-24 DIAGNOSIS — I739 Peripheral vascular disease, unspecified: Secondary | ICD-10-CM

## 2022-12-24 DIAGNOSIS — Z1211 Encounter for screening for malignant neoplasm of colon: Secondary | ICD-10-CM

## 2022-12-24 DIAGNOSIS — M869 Osteomyelitis, unspecified: Secondary | ICD-10-CM | POA: Diagnosis not present

## 2022-12-24 LAB — CBC WITH DIFFERENTIAL/PLATELET
Basophils Absolute: 0 10*3/uL (ref 0.0–0.1)
Basophils Relative: 0.3 % (ref 0.0–3.0)
Eosinophils Absolute: 0.1 10*3/uL (ref 0.0–0.7)
Eosinophils Relative: 1.9 % (ref 0.0–5.0)
HCT: 43.3 % (ref 39.0–52.0)
Hemoglobin: 14.4 g/dL (ref 13.0–17.0)
Lymphocytes Relative: 32.8 % (ref 12.0–46.0)
Lymphs Abs: 2.1 10*3/uL (ref 0.7–4.0)
MCHC: 33.2 g/dL (ref 30.0–36.0)
MCV: 81.5 fl (ref 78.0–100.0)
Monocytes Absolute: 0.5 10*3/uL (ref 0.1–1.0)
Monocytes Relative: 7.8 % (ref 3.0–12.0)
Neutro Abs: 3.7 10*3/uL (ref 1.4–7.7)
Neutrophils Relative %: 57.2 % (ref 43.0–77.0)
Platelets: 257 10*3/uL (ref 150.0–400.0)
RBC: 5.32 Mil/uL (ref 4.22–5.81)
RDW: 13.9 % (ref 11.5–15.5)
WBC: 6.4 10*3/uL (ref 4.0–10.5)

## 2022-12-24 LAB — MICROALBUMIN / CREATININE URINE RATIO
Creatinine,U: 53 mg/dL
Microalb Creat Ratio: 29 mg/g (ref 0.0–30.0)
Microalb, Ur: 15.4 mg/dL — ABNORMAL HIGH (ref 0.0–1.9)

## 2022-12-24 LAB — LIPID PANEL
Cholesterol: 111 mg/dL (ref 0–200)
HDL: 40.1 mg/dL (ref >39.00)
LDL Cholesterol: 58 mg/dL (ref 0–99)
NonHDL: 71.25
Total CHOL/HDL Ratio: 3
Triglycerides: 68 mg/dL (ref 0.0–149.0)
VLDL: 13.6 mg/dL (ref 0.0–40.0)

## 2022-12-24 LAB — COMPREHENSIVE METABOLIC PANEL
ALT: 23 U/L (ref 0–53)
AST: 16 U/L (ref 0–37)
Albumin: 4 g/dL (ref 3.5–5.2)
Alkaline Phosphatase: 113 U/L (ref 39–117)
BUN: 30 mg/dL — ABNORMAL HIGH (ref 6–23)
CO2: 26 meq/L (ref 19–32)
Calcium: 9.7 mg/dL (ref 8.4–10.5)
Chloride: 104 meq/L (ref 96–112)
Creatinine, Ser: 1.01 mg/dL (ref 0.40–1.50)
GFR: 89.41 mL/min (ref >60.00)
Glucose, Bld: 128 mg/dL — ABNORMAL HIGH (ref 70–99)
Potassium: 4.7 meq/L (ref 3.5–5.1)
Sodium: 137 meq/L (ref 135–145)
Total Bilirubin: 0.4 mg/dL (ref 0.2–1.2)
Total Protein: 7.3 g/dL (ref 6.0–8.3)

## 2022-12-24 MED ORDER — LISINOPRIL 20 MG PO TABS
20.0000 mg | ORAL_TABLET | Freq: Every day | ORAL | 3 refills | Status: DC
Start: 2022-12-24 — End: 2024-02-09

## 2022-12-24 NOTE — Telephone Encounter (Signed)
Dr. Ardelle Anton,  I saw [facility] today. He is under impression he should get antibiotic for his toe. He states no med at pharmacy. I reached out to your office but they state no indication he should be on antibiotic. Your note is not done. He changed his pharmacy to [facility] on [address] if you are going to give antibiotic.  Thanks,   [name], PA-C

## 2022-12-24 NOTE — Patient Instructions (Addendum)
For you wellness exam today I have ordered cbc, cmp and lipid panel.  Vaccine declined today by pt.  Referral for colonoscopy  placed.  When you get annual vision check/diabetic eye exam please get optometrist to fax [place] results  Recommend exercise and healthy diet.  We will let you know lab results as they come in.  Follow up date appointment will be determined after lab review.    Htn- bp is controlled today. Continue  lisinopril 20 mg daily.    For diabetes- continue with med rx by Dr. [name]. Lantus, humalog and jardiance. On 03/04/2022 6.5.  Yesterday seen by podiatrist. Dx osteomyelitis. But only note I see in computer is a "Hold antibiotics to see" we contacted office of podiatrist and they are not giving our office answer. So please have your friend call to speak directly with podiatrist office. To clarify. If you need antibiotic want you to receive correct antibiotic.   Genella Rife- controlled with omeprazole. He states does not have to use daily.   Follow up date to be determined after lab review.

## 2022-12-24 NOTE — Progress Notes (Signed)
Subjective:    Patient ID: Thomas [name], male    DOB: March 24, 1976, 47 y.o.   MRN: [id number]  HPI  Pt  her for wellness exam.  Pt stopped smoking one year ago. No alcohol. Exercising some walking one hour daily. Pt states eating healthy.   On review pt not had colonoscopy. Will place referral today.  Pt has seen optometrist one year ago. Wears glasses.   Declines flu vaccine. Declined tdap today as well.  Chronic med problems below  Htn- bp is controlled today. On lisinopril 20 mg daily. Bp controlled today   For diabetes- continue with med rx by Dr. [name]. Lantus, humalog and jardiance. On 03/04/2022 6.5.  Yesterday seen by podiatrist. Dx osteomyelitis. Podiatrist sent rx to [facility] per pt but med did not arrive to [facility]. I reviewed not but it is not done yet? I don't know which antibiotic was recommended. Pt wants to change pharmacy to [facility] [address]. Asking staff today to call over and talk with podiatrist.  Genella Rife- controlled with omeprazole. He states does not have to use daily.    Review of Systems  Constitutional:  Negative for chills, fatigue and fever.  HENT:  Negative for congestion and ear pain.   Respiratory:  Negative for cough, chest tightness, shortness of breath and wheezing.   Cardiovascular:  Negative for chest pain and palpitations.  Gastrointestinal:  Negative for abdominal pain, blood in stool, constipation, diarrhea and vomiting.  Genitourinary:  Negative for dysuria and frequency.  Musculoskeletal:  Negative for back pain, myalgias and neck pain.  Skin:  Negative for rash.  Neurological:  Negative for dizziness, syncope and light-headedness.  Hematological:  Negative for adenopathy. Does not bruise/bleed easily.  Psychiatric/Behavioral:  Negative for behavioral problems and decreased concentration. The patient is not nervous/anxious.    Past Medical History:  Diagnosis Date   Depression 01/27/2015   Diabetes mellitus  without complication (HCC)    Hyperlipidemia    Hypertension    Peripheral vascular disease (HCC)      Social History   Socioeconomic History   Marital status: Single    Spouse name: Not on file   Number of children: Not on file   Years of education: Not on file   Highest education level: Not on file  Occupational History   Occupation: Unemployed  Tobacco Use   Smoking status: Former    Current packs/day: 0.00    Types: Cigarettes    Quit date: 01/13/2022    Years since quitting: 0.9    Passive exposure: Never   Smokeless tobacco: Former   Tobacco comments:    2 cigarrettes a day  Vaping Use   Vaping status: Never Used  Substance and Sexual Activity   Alcohol use: No    Alcohol/week: 0.0 standard drinks of alcohol   Drug use: No   Sexual activity: Not on file  Other Topics Concern   Not on file  Social History Narrative   Are you right handed or left handed? Right Handed    Are you currently employed ? No work    What is your current occupation?   Do you live at home alone? Yes   Who lives with you? No   What type of home do you live in: 1 story or 2 story? One story home       Social Determinants of Health   Financial Resource Strain: Not on file  Food Insecurity: Not on file  Transportation Needs: Not on  file  Physical Activity: Not on file  Stress: Not on file  Social Connections: Not on file  Intimate Partner Violence: Not on file    Past Surgical History:  Procedure Laterality Date   ABDOMINAL AORTOGRAM W/LOWER EXTREMITY Right 04/19/2019   Procedure: ABDOMINAL AORTOGRAM W/LOWER EXTREMITY;  Surgeon: [name], MD;  Location: [facility] INVASIVE CV LAB;  Service: Cardiovascular;  Laterality: Right;   ABDOMINAL AORTOGRAM W/LOWER EXTREMITY Right 09/16/2019   Procedure: ABDOMINAL AORTOGRAM W/LOWER EXTREMITY;  Surgeon: [name], MD;  Location: [facility] INVASIVE CV LAB;  Service: Cardiovascular;  Laterality: Right;   ABDOMINAL AORTOGRAM W/LOWER EXTREMITY  N/A 08/17/2020   Procedure: ABDOMINAL AORTOGRAM W/LOWER EXTREMITY;  Surgeon: [name], MD;  Location: [facility] INVASIVE CV LAB;  Service: Cardiovascular;  Laterality: N/A;   ABDOMINAL AORTOGRAM W/LOWER EXTREMITY N/A 12/27/2021   Procedure: ABDOMINAL AORTOGRAM W/LOWER EXTREMITY;  Surgeon: [name], MD;  Location: [facility] INVASIVE CV LAB;  Service: Cardiovascular;  Laterality: N/A;   FEMORAL-TIBIAL BYPASS GRAFT Right 04/22/2019   Procedure: Right  FEMORAL-TIBIAL ARTERY BYPASS GRAFT with Harvest of Saphenous vein RIGHT SUPERFICIAL FEMORAL ARTERY TO TIBIOPERONEAL TRUNK BYPASS;  Surgeon: [name], MD;  Location: [facility] OR;  Service: Vascular;  Laterality: Right;   IR CERVICAL/THORACIC DISC ASPIRATION W/IMAG GUIDE  04/15/2019   IR FLUORO GUIDED NEEDLE PLC ASPIRATION/INJECTION LOC  04/15/2019   kidney stones  2009   PERIPHERAL VASCULAR BALLOON ANGIOPLASTY Right 09/16/2019   Procedure: PERIPHERAL VASCULAR BALLOON ANGIOPLASTY;  Surgeon: [name], MD;  Location: [facility] INVASIVE CV LAB;  Service: Cardiovascular;  Laterality: Right;  ext iliac DCB   PERIPHERAL VASCULAR BALLOON ANGIOPLASTY  08/17/2020   Procedure: PERIPHERAL VASCULAR BALLOON ANGIOPLASTY;  Surgeon: [name], MD;  [facility]: [facility] INVASIVE CV LAB;  Service: Cardiovascular;;  Right TP trunk   PERIPHERAL VASCULAR BALLOON ANGIOPLASTY Left 08/24/2020   Procedure: PERIPHERAL VASCULAR BALLOON ANGIOPLASTY;  Surgeon: [name], MD;  [facility]: [facility] INVASIVE CV LAB;  Service: Cardiovascular;  Laterality: Left;  Tibioperoneal trunk and superficial femoral arteries.   PERIPHERAL VASCULAR BALLOON ANGIOPLASTY  12/27/2021   Procedure: PERIPHERAL VASCULAR BALLOON ANGIOPLASTY;  Surgeon: [name], MD;  Location: [facility]VASIVE CV LAB;  Service: Cardiovascular;;   PERIPHERAL VASCULAR INTERVENTION  04/19/2019   Procedure: PERIPHERAL VASCULAR INTERVENTION;  Surgeon: [name], MD;  Location: [facility] INVASIVE CV LAB;   Service: Cardiovascular;;  right external iliac   PERIPHERAL VASCULAR INTERVENTION  12/27/2021   Procedure: PERIPHERAL VASCULAR INTERVENTION;  Surgeon: [name], MD;  Location: [facility] INVASIVE CV LAB;  Service: Cardiovascular;;   TEE WITHOUT CARDIOVERSION N/A 04/19/2019   Procedure: TRANSESOPHAGEAL ECHOCARDIOGRAM (TEE);  Surgeon: [name], MD;  Location: [facility] ENDOSCOPY;  Service: Endoscopy;  Laterality: N/A;    Family History  Problem Relation Age of Onset   Colon cancer Neg Hx    Esophageal cancer Neg Hx    Pancreatic cancer Neg Hx    Liver cancer Neg Hx    Stomach cancer Neg Hx     No Known Allergies  Current Outpatient Medications on File Prior to Visit  Medication Sig Dispense Refill   acetaminophen (TYLENOL) 500 MG tablet Take 500 mg by mouth 2 (two) times daily.     aspirin EC 81 MG tablet Take 81 mg by mouth daily. Swallow whole.     atorvastatin (LIPITOR) 20 MG tablet TAKE 1 TABLET BY MOUTH DAILY 30 tablet 0   cephALEXin (KEFLEX) 500 MG capsule Take 1 capsule (500 mg  total) by mouth 2 (two) times daily. (Patient not taking: Reported on 10/22/2022) 20 capsule 0   clopidogrel (PLAVIX) 75 MG tablet TAKE ONE TABLET BY MOUTH DAILY WITH BREAKFAST 90 tablet 2   Continuous Glucose Sensor (DEXCOM G7 SENSOR) MISC 1 Device by Does not apply route as directed. 9 each 3   doxycycline (VIBRA-TABS) 100 MG tablet Take 1 tablet (100 mg total) by mouth 2 (two) times daily. 20 tablet 0   doxycycline (VIBRA-TABS) 100 MG tablet Take 1 tablet (100 mg total) by mouth 2 (two) times daily. 20 tablet 0   empagliflozin (JARDIANCE) 25 MG TABS tablet Take 1 tablet (25 mg total) by mouth daily before breakfast. 90 tablet 3   Heating Pads PADS Use over the counter heating pads as needed for back pain 1 Units 5   insulin glargine (LANTUS SOLOSTAR) 100 UNIT/ML Solostar Pen Inject 34 Units into the skin daily. 30 mL 0   insulin lispro (HUMALOG KWIKPEN) 100 UNIT/ML KwikPen Inject 8 Units into the  skin 3 (three) times daily. 30 mL 0   Insulin Pen Needle 32G X 4 MM MISC 1 Device by Does not apply route in the morning, at noon, in the evening, and at bedtime. 400 each 3   lisinopril (ZESTRIL) 20 MG tablet TAKE ONE TABLET BY MOUTH DAILY 90 tablet 3   omeprazole (PRILOSEC) 20 MG capsule TAKE 1 CAPSULE BY MOUTH DAILY 90 capsule 0   silver sulfADIAZINE (SILVADENE) 1 % cream Apply 1 Application topically daily. 50 g 0   tadalafil (CIALIS) 5 MG tablet Take 1 tablet (5 mg total) by mouth daily as needed for erectile dysfunction. 30 tablet 0   No current facility-administered medications on file prior to visit.    BP 128/84   Pulse 89   Resp 18   Ht 5\' 8"  (1.727 m)   Wt 211 lb 9.6 oz (96 kg)   SpO2 100%   BMI 32.17 kg/m        Objective:   Physical Exam  General Mental Status- Alert. General Appearance- Not in acute distress.    Neck Carotid Arteries- Normal color. Moisture- Normal Moisture. No carotid bruits. No JVD.  Chest and Lung Exam Auscultation: Breath Sounds:-Normal.  Cardiovascular Auscultation:Rythm- Regular. Murmurs & Other Heart Sounds:Auscultation of the heart reveals- No Murmurs.  Abdomen Inspection:-Inspeection Normal. Palpation/Percussion:Note:No mass. Palpation and Percussion of the abdomen reveal- Non Tender, Non Distended + BS, no rebound or guarding.   Neurologic Cranial Nerve exam:- CN III-XII intact(No nystagmus), symmetric smile. Strength:- 5/5 equal and symmetric strength both upper and lower extremities.   Rt foot- 3rd toe distal aspect. Normal color. No obvious deep ulcer. No dc. No infection that I can see. Xray report not back in epic?    Assessment & Plan:   Patient Instructions  For you wellness exam today I have ordered cbc, cmp and lipid panel.  Vaccine declined today by pt.  Referral for colonoscopy  placed.  When you get annual vision check/diabetic eye exam please get optometrist to fax [place] results  Recommend exercise and  healthy diet.  We will let you know lab results as they come in.  Follow up date appointment will be determined after lab review.    Htn- bp is controlled today. Continue  lisinopril 20 mg daily.    For diabetes- continue with med rx by Dr. [name]. Lantus, humalog and jardiance. On 03/04/2022 6.5.  Yesterday seen by podiatrist. Dx osteomyelitis. But only note I see in computer  is a "Hold antibiotics to see" we contacted office of podiatrist and they are not giving our office answer. So please have your friend call to speak directly with podiatrist office. To clarify. If you need antibiotic want you to receive correct antibiotic.   Genella Rife- controlled with omeprazole. He states does not have to use daily.     Follow up date to be determined after lab review.  [name], [place]   [id number] charge as addressed gerd,  htn, diabetes, discussed his request for antibiotic for toe infection(not seen pt in more than a year). Various attempts to commnicate with podiatrist office to get answer for pt who thinks antibiotics was to be sent to pharmacy. In end sent message direct to Dr. Ardelle Anton and asked pt friend to call podiatrist office directly.

## 2022-12-25 ENCOUNTER — Telehealth: Payer: Self-pay | Admitting: Podiatry

## 2022-12-25 ENCOUNTER — Other Ambulatory Visit: Payer: Self-pay | Admitting: Podiatry

## 2022-12-25 MED ORDER — DOXYCYCLINE HYCLATE 100 MG PO TABS: 100.0000 mg | ORAL_TABLET | Freq: Two times a day (BID) | ORAL | 0 refills | Status: DC

## 2022-12-25 NOTE — Telephone Encounter (Signed)
Notified pts friend that he was going to try to not have pt use antibiotics they were just going to observe but just for precaution he sent in the antibiotic

## 2022-12-25 NOTE — Telephone Encounter (Signed)
Pts friend called as pts translator and stated pt said he was to have an antibiotic called in and the pharmacy has not received it. The pharmacy is correct in the chart. Please notify friend when done.

## 2023-01-02 ENCOUNTER — Other Ambulatory Visit: Payer: Self-pay | Admitting: Internal Medicine

## 2023-01-07 ENCOUNTER — Other Ambulatory Visit: Payer: Self-pay

## 2023-01-07 MED ORDER — LANTUS SOLOSTAR 100 UNIT/ML ~~LOC~~ SOPN: 34.0000 [IU] | PEN_INJECTOR | Freq: Every day | SUBCUTANEOUS | 1 refills | Status: DC

## 2023-01-13 ENCOUNTER — Telehealth: Payer: Self-pay | Admitting: Medical

## 2023-01-13 MED ORDER — ATORVASTATIN CALCIUM 20 MG PO TABS: 20.0000 mg | ORAL_TABLET | Freq: Every day | ORAL | 0 refills | Status: DC

## 2023-01-13 NOTE — Progress Notes (Unsigned)
HISTORY AND PHYSICAL     CC:  follow up. Requesting Provider:  [name], PA-C  HPI: [name] who is here today for follow up for PAD.  Pt has hx of: -angiogram with right EIA angioplasty and stent 04/19/2019 Dr. Chestine Spore -right AK popliteal to TPT bypass with reversed GSV 04/22/2019 Dr. Chestine Spore  -angiogram with right EIA angioplasty for in-stent restenosis 09/16/2019 Dr. Chestine Spore -angiogram with right PTA and TPT angioplasty 08/17/2020 Dr. Chestine Spore -angiogram with left BK popliteal, TPT, proximal PTA angioplasty and left SFA angioplasty 08/25/2022 Dr. Chestine Spore -angiogram with right proximal SFA angioplasty with stent and right TPT and PTA angioplasty 12/27/2021 Dr. Chestine Spore  Pt was last seen 10/22/2022 and at that time, his toe was improving and he was scheduled for 3 month follow up.  He did see Dr. Ardelle Anton on 12/23/2022 and at that time, his xrays were somewhat stable.  He recommended toe amputation but pt did not want to proceed.  He recommended holding abx and observing.  He will see him back in a week or so for follow up xrays.    The pt returns today for follow up.  ***  The pt is on a statin for cholesterol management.    The pt is on an aspirin.    Other AC:  plavix The pt is on ACEI for hypertension.  The pt is  on medication for diabetes. Tobacco hx:  former   Past Medical History:  Diagnosis Date   Depression 01/27/2015   Diabetes mellitus without complication (HCC)    Hyperlipidemia    Hypertension    Peripheral vascular disease [facility])     Past Surgical History:  Procedure Laterality Date   ABDOMINAL AORTOGRAM W/LOWER EXTREMITY Right 04/19/2019   Procedure: ABDOMINAL AORTOGRAM W/LOWER EXTREMITY;  Surgeon: [name], MD;  Location: [facility] INVASIVE CV LAB;  Service: Cardiovascular;  Laterality: Right;   ABDOMINAL AORTOGRAM W/LOWER EXTREMITY Right 09/16/2019   Procedure: ABDOMINAL AORTOGRAM W/LOWER EXTREMITY;  Surgeon: [name], MD;  Location: [facility] INVASIVE CV  LAB;  Service: Cardiovascular;  Laterality: Right;   ABDOMINAL AORTOGRAM W/LOWER EXTREMITY N/A 08/17/2020   Procedure: ABDOMINAL AORTOGRAM W/LOWER EXTREMITY;  Surgeon: [name], MD;  Location: [facility] INVASIVE CV LAB;  Service: Cardiovascular;  Laterality: N/A;   ABDOMINAL AORTOGRAM W/LOWER EXTREMITY N/A 12/27/2021   Procedure: ABDOMINAL AORTOGRAM W/LOWER EXTREMITY;  Surgeon: [name], MD;  [facility]: [facility] INVASIVE CV LAB;  Service: Cardiovascular;  Laterality: N/A;   FEMORAL-TIBIAL BYPASS GRAFT Right 04/22/2019   Procedure: Right  FEMORAL-TIBIAL ARTERY BYPASS GRAFT with Harvest of Saphenous vein RIGHT SUPERFICIAL FEMORAL ARTERY TO TIBIOPERONEAL TRUNK BYPASS;  Surgeon: [name], MD;  Location: [facility] OR;  Service: Vascular;  Laterality: Right;   IR CERVICAL/THORACIC DISC ASPIRATION W/IMAG GUIDE  04/15/2019   IR FLUORO GUIDED NEEDLE PLC ASPIRATION/INJECTION LOC  04/15/2019   kidney stones  2009   PERIPHERAL VASCULAR BALLOON ANGIOPLASTY Right 09/16/2019   Procedure: PERIPHERAL VASCULAR BALLOON ANGIOPLASTY;  Surgeon: [name], MD;  Location: [facility] INVASIVE CV LAB;  Service: Cardiovascular;  Laterality: Right;  ext iliac DCB   PERIPHERAL VASCULAR BALLOON ANGIOPLASTY  08/17/2020   Procedure: PERIPHERAL VASCULAR BALLOON ANGIOPLASTY;  Surgeon: [name], MD;  [facility]: [facility] INVASIVE CV LAB;  Service: Cardiovascular;;  Right TP trunk   PERIPHERAL VASCULAR BALLOON ANGIOPLASTY Left 08/24/2020   Procedure: PERIPHERAL VASCULAR BALLOON ANGIOPLASTY;  Surgeon: [name], MD;  Location: [facility] INVASIVE CV LAB;  Service: Cardiovascular;  Laterality: Left;  Tibioperoneal trunk and superficial femoral arteries.   PERIPHERAL VASCULAR BALLOON ANGIOPLASTY  12/27/2021   Procedure: PERIPHERAL VASCULAR BALLOON ANGIOPLASTY;  Surgeon: [name], MD;  Location: [facility] INVASIVE CV LAB;  Service: Cardiovascular;;   PERIPHERAL VASCULAR INTERVENTION  04/19/2019   Procedure: PERIPHERAL  VASCULAR INTERVENTION;  Surgeon: [name], MD;  [facility]: [facility] INVAS[facility] CV LAB;  Service: Cardiovascular;;  right external iliac   PERIPHERAL VASCULAR INTERVENTION  12/27/2021   Procedure: PERIPHERAL VASCULAR INTERVENTION;  Surgeon: [name], MD;  [facility]: [facility]VASIVE [facility] LAB;  Service: Cardiovascular;;   TEE WITHOUT CARDIOVERSION N/A 04/19/2019   Procedure: TRANSESOPHAGEAL ECHOCARDIOGRAM (TEE);  Surgeon: [name], MD;  Location: [facility] ENDOSCOPY;  Service: Endoscopy;  Laterality: N/A;    No Known Allergies  Current Outpatient Medications  Medication Sig Dispense Refill   acetaminophen (TYLENOL) 500 MG tablet Take 500 mg by mouth 2 (two) times daily.     aspirin EC 81 MG tablet Take 81 mg by mouth daily. Swallow whole.     atorvastatin (LIPITOR) 20 MG tablet TAKE 1 TABLET BY MOUTH DAILY 60 tablet 0   cephALEXin (KEFLEX) 500 MG capsule Take 1 capsule (500 mg total) by mouth 2 (two) times daily. (Patient not taking: Reported on 10/22/2022) 20 capsule 0   clopidogrel (PLAVIX) 75 MG tablet TAKE ONE TABLET BY MOUTH DAILY WITH BREAKFAST 90 tablet 2   Continuous Glucose Sensor (DEXCOM G7 SENSOR) MISC 1 Device by Does not apply route as directed. 9 each 3   doxycycline (VIBRA-TABS) 100 MG tablet Take 1 tablet (100 mg total) by mouth 2 (two) times daily. 20 tablet 0   doxycycline (VIBRA-TABS) 100 MG tablet Take 1 tablet (100 mg total) by mouth 2 (two) times daily. 20 tablet 0   empagliflozin (JARDIANCE) 25 MG TABS tablet Take 1 tablet (25 mg total) by mouth daily before breakfast. 90 tablet 3   Heating Pads PADS Use over the counter heating pads as needed for back pain 1 Units 5   insulin glargine (LANTUS SOLOSTAR) 100 UNIT/ML Solostar Pen Inject 34 Units into the skin daily. 30 mL 1   insulin lispro (HUMALOG KWIKPEN) 100 UNIT/ML KwikPen Inject 8 Units into the skin 3 (three) times daily. 30 mL 0   Insulin Pen Needle 32G X 4 MM MISC 1 Device by Does not apply route in the  morning, at noon, in the evening, and at bedtime. 400 each 3   lisinopril (ZESTRIL) 20 MG tablet Take 1 tablet (20 mg total) by mouth daily. 90 tablet 3   omeprazole (PRILOSEC) 20 MG capsule TAKE 1 CAPSULE BY MOUTH DAILY 90 capsule 0   silver sulfADIAZINE (SILVADENE) 1 % cream Apply 1 Application topically daily. 50 g 0   tadalafil (CIALIS) 5 MG tablet Take 1 tablet (5 mg total) by mouth daily as needed for erectile dysfunction. 30 tablet 0   No current facility-administered medications for this visit.    Family History  Problem Relation Age of Onset   Colon cancer Neg Hx    Esophageal cancer Neg Hx    Pancreatic cancer Neg Hx    Liver cancer Neg Hx    Stomach cancer Neg Hx     Social History   Socioeconomic History   Marital status: Single    Spouse name: Not on file   Number of children: Not on file   Years of education: Not on file   Highest education level: Not on file  Occupational History  Occupation: Unemployed  Tobacco Use   Smoking status: Former    Current packs/day: 0.00    Types: Cigarettes    Quit date: 01/13/2022    Years since quitting: 1.0    Passive exposure: Never   Smokeless tobacco: Former   Tobacco comments:    2 cigarrettes a day  Vaping Use   Vaping status: Never Used  Substance and Sexual Activity   Alcohol use: No    Alcohol/week: 0.0 standard drinks of alcohol   Drug use: No   Sexual activity: Not on file  Other Topics Concern   Not on file  Social History Narrative   Are you right handed or left handed? Right Handed    Are you currently employed ? No work    What is your current occupation?   Do you live at home alone? Yes   Who lives with you? No   What type of home do you live in: 1 story or 2 story? One story home       Social Determinants of Health   Financial Resource Strain: Not on file  Food Insecurity: Not on file  Transportation Needs: Not on file  Physical Activity: Not on file  Stress: Not on file  Social  Connections: Not on file  Intimate Partner Violence: Not on file     REVIEW OF SYSTEMS:  *** [X]  denotes positive finding, [ ]  denotes negative finding Cardiac  Comments:  Chest pain or chest pressure:    Shortness of breath upon exertion:    Short of breath when lying flat:    Irregular heart rhythm:        Vascular    Pain in calf, thigh, or hip brought on by ambulation:    Pain in feet at night that wakes you up from your sleep:     Blood clot in your veins:    Leg swelling:         Pulmonary    Oxygen at home:    Productive cough:     Wheezing:         Neurologic    Sudden weakness in arms or legs:     Sudden numbness in arms or legs:     Sudden onset of difficulty speaking or slurred speech:    Temporary loss of vision in one eye:     Problems with dizziness:         Gastrointestinal    Blood in stool:     Vomited blood:         Genitourinary    Burning when urinating:     Blood in urine:        Psychiatric    Major depression:         Hematologic    Bleeding problems:    Problems with blood clotting too easily:        Skin    Rashes or ulcers:        Constitutional    Fever or chills:      PHYSICAL EXAMINATION:  ***  General:  WDWN in NAD; vital signs documented above Gait: Not observed HENT: WNL, normocephalic Pulmonary: normal non-labored breathing , without wheezing Cardiac: {Desc; regular/irreg:14544} HR, {With/Without:20273} carotid bruit*** Abdomen: soft, NT; aortic pulse is *** palpable Skin: {With/Without:20273} rashes Vascular Exam/Pulses:  Right Left  Radial {Exam; arterial pulse strength 0-4:30167} {Exam; arterial pulse strength 0-4:30167}  Femoral {Exam; arterial pulse strength 0-4:30167} {Exam; arterial pulse strength 0-4:30167}  Popliteal {Exam; arterial pulse  strength 0-4:30167} {Exam; arterial pulse strength 0-4:30167}  DP {Exam; arterial pulse strength 0-4:30167} {Exam; arterial pulse strength 0-4:30167}  PT {Exam; arterial  pulse strength 0-4:30167} {Exam; arterial pulse strength 0-4:30167}  Peroneal *** ***   Extremities: {With/Without:20273} ischemic changes, {With/Without:20273} Gangrene , {With/Without:20273} cellulitis; {With/Without:20273} open wounds Musculoskeletal: no muscle wasting or atrophy  Neurologic: A&O X 3 Psychiatric:  The pt has {Desc; normal/abnormal:11317::"Normal"} affect.   Non-Invasive Vascular Imaging:   ABI's/TBI's on ***: Right:  *** - Great toe pressure: *** Left:  *** - Great toe pressure: ***  Arterial duplex on ***: ***  Previous ABI's/TBI's on ***: Right:  *** - Great toe pressure: *** Left:  *** - Great toe pressure:  ***  Previous arterial duplex on ***: ***    ASSESSMENT/PLAN:: 47 y.o. male here for follow up for PAD with hx of: -angiogram with right EIA angioplasty and stent 04/19/2019 Dr. Chestine Spore -right AK popliteal to TPT bypass with reversed GSV 04/22/2019 Dr. Chestine Spore  -angiogram with right EIA angioplasty for in-stent restenosis 09/16/2019 Dr. Chestine Spore -angiogram with right PTA and TPT angioplasty 08/17/2020 Dr. Chestine Spore -angiogram with left BK popliteal, TPT, proximal PTA angioplasty and left SFA angioplasty 08/25/2022 Dr. Chestine Spore -angiogram with right proximal SFA angioplasty with stent and right TPT and PTA angioplasty 12/27/2021 Dr. Chestine Spore   -*** -continue asa/plavix/statin -pt will f/u in *** with ***.   Doreatha Massed, [facility] [phone]  Clinic MD:   Chestine Spore

## 2023-01-13 NOTE — Telephone Encounter (Signed)
Rx sent 

## 2023-01-13 NOTE — Telephone Encounter (Signed)
Pt came in asking for a refill of atorvastatin. Pt asks that it be sent to the [facility] on [address].Please call pt when rx sent in.

## 2023-01-14 ENCOUNTER — Other Ambulatory Visit (HOSPITAL_COMMUNITY): Payer: Medicaid Other

## 2023-01-14 ENCOUNTER — Ambulatory Visit: Payer: Medicaid Other | Admitting: Physician Assistant

## 2023-01-14 ENCOUNTER — Ambulatory Visit (HOSPITAL_COMMUNITY)
Admission: RE | Admit: 2023-01-14 | Discharge: 2023-01-14 | Disposition: A | Discharge status: Home / Self Care | Payer: Medicaid Other | Source: Ambulatory Visit | Attending: Vascular Surgery | Admitting: Vascular Surgery

## 2023-01-14 ENCOUNTER — Ambulatory Visit (INDEPENDENT_AMBULATORY_CARE_PROVIDER_SITE_OTHER)
Admission: RE | Admit: 2023-01-14 | Discharge: 2023-01-14 | Disposition: A | Discharge status: Home / Self Care | Payer: Medicaid Other | Source: Ambulatory Visit | Attending: Vascular Surgery | Admitting: Vascular Surgery

## 2023-01-14 VITALS — BP 136/81 | HR 89 | Temp 98.2°F | Wt 209.0 lb

## 2023-01-14 DIAGNOSIS — I739 Peripheral vascular disease, unspecified: Secondary | ICD-10-CM | POA: Diagnosis not present

## 2023-01-14 DIAGNOSIS — I70213 Atherosclerosis of native arteries of extremities with intermittent claudication, bilateral legs: Secondary | ICD-10-CM | POA: Diagnosis not present

## 2023-01-14 LAB — VAS US ABI WITH/WO TBI
Left ABI: 0.53
Right ABI: 0.88

## 2023-01-20 ENCOUNTER — Ambulatory Visit (INDEPENDENT_AMBULATORY_CARE_PROVIDER_SITE_OTHER): Payer: Medicaid Other

## 2023-01-20 ENCOUNTER — Ambulatory Visit: Payer: Medicaid Other | Admitting: Podiatry

## 2023-01-20 ENCOUNTER — Encounter: Payer: Self-pay | Admitting: Podiatry

## 2023-01-20 VITALS — HR 70 | Resp 18 | Ht 68.0 in | Wt 209.0 lb

## 2023-01-20 DIAGNOSIS — M86171 Other acute osteomyelitis, right ankle and foot: Secondary | ICD-10-CM

## 2023-01-20 NOTE — Patient Instructions (Signed)
Monitor for any signs/symptoms of infection. Call the office immediately if any occur or go directly to the emergency room. Call with any questions/concerns.  

## 2023-01-27 NOTE — Progress Notes (Signed)
  Subjective:   Patient ID: Thomas [name], male   DOB: 47 y.o.   MRN: [id number]   HPI Chief Complaint  Patient presents with   Toe Pain    Return in about 4 weeks (around 01/20/2023) for infection F/U      47 year old male presents the office today with concern of infection of the right third toe, swelling.  States he is doing very well.  He has no concerns.  No open lesion he reports no drainage.  Denies any fevers or chills.       Objective:  Physical Exam  General: AAO x3, NAD-translator present  Dermatological: Minimal callus formation at distal aspect of the third toe without any drainage or pus.  Still localized edema without any ascending cellulitis.  There is no drainage or pus.  There is no fluctuation or crepitation.  There is no malodor.  Overall appears to be stable.  Vascular: Appropriately warm and well-perfused.  Neruologic: Sensation decreased  Musculoskeletal: Digital contracture.  No pain on exam.     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  There is cortical changes along the distal phalanx.  Does not seem to be significantly worsened compared to prior x-rays. -We can discuss both conservative as well as surgical options.  Previously would recommend amputation of the toe but he does not want to proceed with this.  For now since he is stable there is no wound will get observe off of antibiotics.  Should there be any changes or worsening to let me know immediately and we will restart antibiotics or recommend going to emergency room. -Monitor for any clinical signs or symptoms of infection and directed to call the office immediately should any occur or go to the ER.  Return in about 2 months (around 03/22/2023).  X-ray next appointment  Vivi Barrack DPM

## 2023-01-31 ENCOUNTER — Other Ambulatory Visit: Payer: Self-pay

## 2023-01-31 DIAGNOSIS — I70435 Atherosclerosis of autologous vein bypass graft(s) of the right leg with ulceration of other part of foot: Secondary | ICD-10-CM

## 2023-01-31 DIAGNOSIS — I739 Peripheral vascular disease, unspecified: Secondary | ICD-10-CM

## 2023-01-31 DIAGNOSIS — I70213 Atherosclerosis of native arteries of extremities with intermittent claudication, bilateral legs: Secondary | ICD-10-CM

## 2023-02-04 ENCOUNTER — Ambulatory Visit: Payer: Medicaid Other | Admitting: Internal Medicine

## 2023-02-04 ENCOUNTER — Encounter: Payer: Self-pay | Admitting: Internal Medicine

## 2023-02-04 ENCOUNTER — Telehealth: Payer: Self-pay

## 2023-02-04 ENCOUNTER — Telehealth: Payer: Self-pay | Admitting: Internal Medicine

## 2023-02-04 VITALS — BP 132/80 | HR 92 | Ht 68.0 in | Wt 213.0 lb

## 2023-02-04 DIAGNOSIS — E1151 Type 2 diabetes mellitus with diabetic peripheral angiopathy without gangrene: Secondary | ICD-10-CM

## 2023-02-04 DIAGNOSIS — Z794 Long term (current) use of insulin: Secondary | ICD-10-CM | POA: Diagnosis not present

## 2023-02-04 DIAGNOSIS — E1142 Type 2 diabetes mellitus with diabetic polyneuropathy: Secondary | ICD-10-CM

## 2023-02-04 MED ORDER — LANTUS SOLOSTAR 100 UNIT/ML ~~LOC~~ SOPN: 32.0000 [IU] | PEN_INJECTOR | Freq: Every day | SUBCUTANEOUS | 3 refills | Status: DC

## 2023-02-04 MED ORDER — INSULIN PEN NEEDLE 32G X 4 MM MISC: 1.0000 | Freq: Four times a day (QID) | 3 refills | Status: DC

## 2023-02-04 MED ORDER — DEXCOM G7 SENSOR MISC: 1.0000 | 3 refills | Status: DC

## 2023-02-04 MED ORDER — EMPAGLIFLOZIN 25 MG PO TABS: 25.0000 mg | ORAL_TABLET | Freq: Every day | ORAL | 3 refills | Status: DC

## 2023-02-04 MED ORDER — INSULIN LISPRO (1 UNIT DIAL) 100 UNIT/ML (KWIKPEN): 7.0000 [IU] | PEN_INJECTOR | Freq: Three times a day (TID) | SUBCUTANEOUS | 3 refills | Status: DC

## 2023-02-04 NOTE — Telephone Encounter (Signed)
Pharmacy Patient Advocate Encounter   Received notification from Pt Calls Messages that prior authorization for Dexcom G7 sensor is required/requested.  Per test claim: PA required; PA submitted to [facility] via CoverMyMeds Key/confirmation #/EOC [id number] Status is pending   PA has been APPROVED through 08/02/2023 PA#: [id number]

## 2023-02-04 NOTE — Telephone Encounter (Signed)
Can you please proceed with prior authorization for Dexcom on this patient?   I believe the last time in office note was needed or some kind of formation.   Thanks

## 2023-02-04 NOTE — Patient Instructions (Addendum)
-   Decrease Lantus 32 units daily  - Continue  HUmalog 7 units with meals  - Continue  Jardiance 25 mg daily        HOW TO TREAT LOW BLOOD SUGARS (Blood sugar LESS THAN 70 MG/DL) Please follow the RULE OF 15 for the treatment of hypoglycemia treatment (when your (blood sugars are less than 70 mg/dL)   STEP 1: Take 15 grams of carbohydrates when your blood sugar is low, which includes:  3-4 GLUCOSE TABS  OR 3-4 OZ OF JUICE OR REGULAR SODA OR ONE TUBE OF GLUCOSE GEL    STEP 2: RECHECK blood sugar in 15 MINUTES STEP 3: If your blood sugar is still low at the 15 minute recheck --> then, go back to STEP 1 and treat AGAIN with another 15 grams of carbohydrates.

## 2023-02-04 NOTE — Progress Notes (Signed)
Name: Thomas [name]  Age/ Sex: 47 y.o., male   MRN/ DOB: [id number], 01/15/1976     PCP: Marisue Brooklyn   Reason for Endocrinology Evaluation: Type 2 Diabetes Mellitus  Initial Endocrine Consultative Visit: 09/15/2020    PATIENT IDENTIFIER: [name] is a 47 y.o. male with a past medical history of T2DM, HTN, and dyslipidemia. The patient has followed with Endocrinology clinic since 09/15/2020 for consultative assistance with management of his diabetes.  DIABETIC HISTORY:  [name] was diagnosed with DM in 2008 .  He was on metformin in the past. His hemoglobin A1c has ranged from 6.7% in 2021, peaking at 15.8% in 2020  On his initial visit with me he had an A1c of 11.3%, he was only on basal insulin, he was having a lot of heartburn, and GI symptoms and we opted to start him on prandial insulin  Patient had connectivity issue with Dexcom, stopped using it 2022-not interested in freestyle [place]   Started jardiance 04/2021 SUBJECTIVE:   During the last visit (03/04/2022): A1c 6.5%     Today (02/04/2023): Mr. [name] is here for follow-up on diabetes management.  He checks his blood sugars 1 times daily.  The patient has not had hypoglycemic episodes since the last clinic visit, but has noted tight BG's in the morning   He receives his insulin through the [facility] health department   The patient has been following up with podiatry (Dr. Loreta Ave) for osteomyelitis of the right third toe He continues to follow-up with vascular surgery for peripheral vascular disease   Denies  nausea, or vomiting  Has occasional constipation but no diarrhea     HOME DIABETES REGIMEN:  Humalog 7 units with each meal  Lantus 34  units daily  Jardiance 25 mg daily     Statin: Yes ACE-I/ARB: Yes Prior Diabetic Education: Yes    DIABETIC COMPLICATIONS: Microvascular complications:   Denies: CKD, neuropathy, retinopathy Last Eye Exam: Completed 03/2021  Macrovascular  complications:  PVD ( S/P right external iliac angioplasty and stent (04/19/2019) followed by bypass 08/2020, left angiogram 08/2022 Denies: CAD, CVA   HISTORY:  Past Medical History:  Past Medical History:  Diagnosis Date   Depression 01/27/2015   Diabetes mellitus without complication (HCC)    Hyperlipidemia    Hypertension    Peripheral vascular disease (HCC)    Past Surgical History:  Past Surgical History:  Procedure Laterality Date   ABDOMINAL AORTOGRAM W/LOWER EXTREMITY Right 04/19/2019   Procedure: ABDOMINAL AORTOGRAM W/LOWER EXTREMITY;  Surgeon: [name], MD;  [facility]: [facility];  Service: Cardiovascular;  Laterality: Right;   ABDOMINAL AORTOGRAM W/LOWER EXTREMITY Right 09/16/2019   Procedure: ABDOMINAL AORTOGRAM W/LOWER EXTREMITY;  Surgeon: [name], MD;  Location: [facility];  Service: Cardiovascular;  Laterality: Right;   ABDOMINAL AORTOGRAM W/LOWER EXTREMITY N/A 08/17/2020   Procedure: ABDOMINAL AORTOGRAM W/LOWER EXTREMITY;  Surgeon: [name], MD;  Location: [facility];  Service: Cardiovascular;  Laterality: N/A;   ABDOMINAL AORTOGRAM W/LOWER EXTREMITY N/A 12/27/2021   Procedure: ABDOMINAL AORTOGRAM W/LOWER EXTREMITY;  Surgeon: [name], MD;  [facility]: [facility];  Service: Cardiovascular;  Laterality: N/A;   FEMORAL-TIBIAL BYPASS GRAFT Right 04/22/2019   Procedure: Right  FEMORAL-TIBIAL ARTERY BYPASS GRAFT with Harvest of Saphenous vein RIGHT SUPERFICIAL FEMORAL ARTERY TO TIBIOPERONEAL TRUNK BYPASS;  Surgeon: [name], MD;  Location: [facility];  Service: Vascular;  Laterality: Right;   IR CERVICAL/THORACIC DISC ASPIRATION W/IMAG  GUIDE  04/15/2019   IR FLUORO GUIDED NEEDLE PLC ASPIRATION/INJECTION LOC  04/15/2019   kidney stones  2009   PERIPHERAL VASCULAR BALLOON ANGIOPLASTY Right 09/16/2019   Procedure: PERIPHERAL VASCULAR BALLOON ANGIOPLASTY;  Surgeon: [name], MD;  [facility];  Service: Cardiovascular;  Laterality: Right;  ext iliac DCB   PERIPHERAL VASCULAR BALLOON ANGIOPLASTY  08/17/2020   Procedure: PERIPHERAL VASCULAR BALLOON ANGIOPLASTY;  Surgeon: [name], MD;  [facility];  Service: Cardiovascular;;  Right TP trunk   PERIPHERAL VASCULAR BALLOON ANGIOPLASTY Left 08/24/2020   Procedure: PERIPHERAL VASCULAR BALLOON ANGIOPLASTY;  Surgeon: [name], MD;  [facility];  Service: Cardiovascular;  Laterality: Left;  Tibioperoneal trunk and superficial femoral arteries.   PERIPHERAL VASCULAR BALLOON ANGIOPLASTY  12/27/2021   Procedure: PERIPHERAL VASCULAR BALLOON ANGIOPLASTY;  Surgeon: [name], MD;  [facility];  Service: Cardiovascular;;   PERIPHERAL VASCULAR INTERVENTION  04/19/2019   Procedure: PERIPHERAL VASCULAR INTERVENTION;  Surgeon: [name], MD;  [facility];  Service: Cardiovascular;;  right external iliac   PERIPHERAL VASCULAR INTERVENTION  12/27/2021   Procedure: PERIPHERAL VASCULAR INTERVENTION;  Surgeon: [name], MD;  [facility];  Service: Cardiovascular;;   TEE WITHOUT CARDIOVERSION N/A 04/19/2019   Procedure: TRANSESOPHAGEAL ECHOCARDIOGRAM (TEE);  Surgeon: [name], MD;  Location: [facility] ENDOSCOPY;  Service: Endoscopy;  Laterality: N/A;   Social History:  reports that he quit smoking about 12 months ago. His smoking use included cigarettes. He has never been exposed to tobacco smoke. He has quit using smokeless tobacco. He reports that he does not drink alcohol and does not use drugs. Family History:  Family History  Problem Relation Age of Onset   Colon cancer Neg Hx    Esophageal cancer Neg Hx    Pancreatic cancer Neg Hx    Liver cancer Neg Hx    Stomach cancer Neg Hx      HOME MEDICATIONS: Allergies as of 02/04/2023   No Known Allergies      Medication List        Accurate  as of February 04, 2023 11:49 AM. If you have any questions, ask your nurse or doctor.          acetaminophen 500 MG tablet Commonly known as: TYLENOL Take 500 mg by mouth 2 (two) times daily.   aspirin EC 81 MG tablet Take 81 mg by mouth daily. Swallow whole.   atorvastatin 20 MG tablet Commonly known as: LIPITOR Take 1 tablet (20 mg total) by mouth daily.   clopidogrel 75 MG tablet Commonly known as: PLAVIX TAKE ONE TABLET BY MOUTH DAILY WITH BREAKFAST   Dexcom G7 Sensor Misc 1 Device by Does not apply route as directed.   doxycycline 100 MG tablet Commonly known as: VIBRA-TABS Take 1 tablet (100 mg total) by mouth 2 (two) times daily.   doxycycline 100 MG tablet Commonly known as: VIBRA-TABS Take 1 tablet (100 mg total) by mouth 2 (two) times daily.   empagliflozin 25 MG Tabs tablet Commonly known as: Jardiance Take 1 tablet (25 mg total) by mouth daily before breakfast.   Heating Pads Pads Use over the counter heating pads as needed for back pain   insulin lispro 100 UNIT/ML KwikPen Commonly known as: HumaLOG KwikPen Inject 8 Units into the skin 3 (three) times daily.   Insulin Pen Needle 32G X 4 MM Misc 1 Device  by Does not apply route in the morning, at noon, in the evening, and at bedtime.   Lantus SoloStar 100 UNIT/ML Solostar Pen Generic drug: insulin glargine Inject 34 Units into the skin daily.   lisinopril 20 MG tablet Commonly known as: ZESTRIL Take 1 tablet (20 mg total) by mouth daily.   omeprazole 20 MG capsule Commonly known as: PRILOSEC TAKE 1 CAPSULE BY MOUTH DAILY   silver sulfADIAZINE 1 % cream Commonly known as: Silvadene Apply 1 Application topically daily.   tadalafil 5 MG tablet Commonly known as: CIALIS Take 1 tablet (5 mg total) by mouth daily as needed for erectile dysfunction.         OBJECTIVE:   Vital Signs: BP 132/80   Pulse 92   Ht 5\' 8"  (1.727 m)   Wt 213 lb (96.6 kg)   SpO2 98%   BMI 32.39 kg/m   Wt  Readings from Last 3 Encounters:  02/04/23 213 lb (96.6 kg)  01/20/23 209 lb (94.8 kg)  01/14/23 209 lb (94.8 kg)     Exam: General: Pt appears well and is in NAD  Lungs: Clear with good BS bilat   Heart: RRR   Extremities: No pretibial edema.   Neuro: MS is good with appropriate affect, pt is alert and Ox3   DM Foot exam 01/20/2023 per podiatry      DATA REVIEWED:  Lab Results  Component Value Date   HGBA1C 6.5 (A) 03/04/2022   HGBA1C 7.5 (A) 08/31/2021   HGBA1C 8.0 (A) 04/27/2021    Latest Reference Range & Units 12/24/22 08:56  Sodium 135 - 145 mEq/L 137  Potassium 3.5 - 5.1 mEq/L 4.7  Chloride 96 - 112 mEq/L 104  CO2 19 - 32 mEq/L 26  Glucose 70 - 99 mg/dL 161 (H)  BUN 6 - 23 mg/dL 30 (H)  Creatinine 0.96 - 1.50 mg/dL 0.45  Calcium 8.4 - 40.9 mg/dL 9.7  Alkaline Phosphatase 39 - 117 U/L 113  Albumin 3.5 - 5.2 g/dL 4.0  AST 0 - 37 U/L 16  ALT 0 - 53 U/L 23  Total Protein 6.0 - 8.3 g/dL 7.3  Total Bilirubin 0.2 - 1.2 mg/dL 0.4  GFR >81.19 mL/min 89.41     Latest Reference Range & Units 12/24/22 08:56  Total CHOL/HDL Ratio  3  Cholesterol 0 - 200 mg/dL 147  HDL Cholesterol >82.95 mg/dL 62.13  LDL (calc) 0 - 99 mg/dL 58  MICROALB/CREAT RATIO 0.0 - 30.0 mg/g 29.0  NonHDL  71.25  Triglycerides 0.0 - 149.0 mg/dL 08.6  VLDL 0.0 - 57.8 mg/dL 46.9    ASSESSMENT / PLAN / RECOMMENDATIONS:   1) Type 2 Diabetes Mellitus, with improving glycemic control , With neuropathic, retinopathic and macrovascular complications as well as microalbuminuria- Most recent A1c of 6.4 %. Goal A1c <7.0%.     -A1c remains at goal -He has been noted with tight BG's in the 70s mg/DL, will decrease basal insulin as below -No glucose data -A new prescription for Dexcom has been sent with a message to help authorization team to proceed as the patient should be able to meet coverage criteria  MEDICATIONS: Decrease Lantus 32 units daily  Continue HUmalog 7 units with each meal   Continue  Jardiance 25 mg daily   EDUCATION / INSTRUCTIONS: BG monitoring instructions: Patient is instructed to check his blood sugars 2 times daily before meals times a day. Call  Endocrinology clinic if: BG persistently < 70  I reviewed the Rule  of 15 for the treatment of hypoglycemia in detail with the patient. Literature supplied.   2) Diabetic complications:  Eye: Does not have known diabetic retinopathy.  Neuro/ Feet: Does  have known diabetic peripheral neuropathy .  Renal: Patient does not have known baseline CKD. He   is  on an ACEI/ARB at present.    3) Dyslipidemia:   -Given peripheral vascular disease, LDL goal <70 mg/dL  -LDL has trended down    Medication Continue atorvastatin 20 mg daily    F/U in 6 months  I spent 25 minutes preparing to see the patient by review of recent labs, imaging and procedures, obtaining and reviewing separately obtained history, communicating with the patient, ordering medications, tests or procedures, and documenting clinical information in the EHR including the differential Dx, treatment, and any further evaluation and other management   Signed electronically by: Lyndle Herrlich, MD  [facility] Endocrinology  [facility] [address] [place], [place] [address] Phone: [phone] FAX: [phone]   CC: Marisue Brooklyn [address] [place] [address] Phone: [phone]  Fax: [phone]  Return to Endocrinology clinic as below: Future Appointments  Date Time Provider Department Center  02/04/2023 11:50 AM Crickett Abbett, Konrad Dolores, MD [facility] None  03/24/2023  9:15 AM Vivi Barrack, DPM [facility]  05/06/2023  9:00 AM [facility]  05/06/2023 10:00 AM [facility]  05/06/2023 10:30 AM [facility]  05/06/2023 11:20 AM [name], MD [facility]  05/08/2023  9:00 AM Derrell Lolling, Rolin Barry, RD  [facility]DM

## 2023-03-11 ENCOUNTER — Telehealth: Payer: Self-pay | Admitting: Medical

## 2023-03-11 MED ORDER — CLOPIDOGREL BISULFATE 75 MG PO TABS: 75.0000 mg | ORAL_TABLET | Freq: Every day | ORAL | 2 refills | Status: DC

## 2023-03-11 NOTE — Telephone Encounter (Signed)
Rx sent 

## 2023-03-11 NOTE — Telephone Encounter (Signed)
Pt came in office stating needing refill on  clopidogrel (PLAVIX) 75 MG tablet  sent to Pharmacy:  [facility] [address], [place] , [place] [address]. Please advise when rx is sent to pharmacy Pt tel [phone].

## 2023-03-24 ENCOUNTER — Encounter: Payer: Self-pay | Admitting: Podiatry

## 2023-03-24 ENCOUNTER — Ambulatory Visit: Payer: Medicaid Other | Admitting: Podiatry

## 2023-03-24 ENCOUNTER — Ambulatory Visit (INDEPENDENT_AMBULATORY_CARE_PROVIDER_SITE_OTHER): Payer: Medicaid Other

## 2023-03-24 DIAGNOSIS — M778 Other enthesopathies, not elsewhere classified: Secondary | ICD-10-CM | POA: Diagnosis not present

## 2023-03-24 DIAGNOSIS — M86671 Other chronic osteomyelitis, right ankle and foot: Secondary | ICD-10-CM | POA: Diagnosis not present

## 2023-03-24 NOTE — Progress Notes (Signed)
  Subjective:   Patient ID: Thomas [name], male   DOB: 47 y.o.   MRN: [id number]   HPI Chief Complaint  Patient presents with   Foot Ulcer    Rm#14 Follow up on right toe ulcers patient states is better .     47 year old male presents the office today with concern of infection of the right third toe, swelling.  States he is doing well.  Denies any open lesions.  No drainage.  He has been using a cream on the toe which been very helpful.  Does not Dors any fevers or chills.  No other concerns today.     Objective:  Physical Exam  General: AAO x3, NAD-translator present  Dermatological: Minimal callus to the distal aspect of the right third toe there is no underlying ulceration noted.  There is some trace edema but is no erythema or warmth.  No increased temperature.  There is no areas of fluctuance or crepitation.  Overall clinically appears to be much improved.    Vascular: Appropriately warm and well-perfused.  Neruologic: Sensation decreased  Musculoskeletal: Digital contracture.  No pain on exam.     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  There is cortical changes along the distal phalanx.  Appears to be chronic changes at this time. -At this time.  Wound is healing clinically does not appear to have any signs of infection and doing much better.  Discussed he is a still monitor this very closely for any signs or symptoms of reoccurrence of infection or open lesions as he is still at risk of losing the toe but for now we will continue to monitor.  Should there be any changes on the nail medially go to emergency room. -Daily foot inspection.  Return in about 3 months (around 06/22/2023) for foot exam.  Vivi Barrack DPM

## 2023-03-24 NOTE — Patient Instructions (Signed)

## 2023-03-26 ENCOUNTER — Other Ambulatory Visit: Payer: Self-pay

## 2023-04-01 LAB — OPHTHALMOLOGY REPORT-SCANNED

## 2023-04-04 ENCOUNTER — Other Ambulatory Visit: Payer: Self-pay

## 2023-04-04 MED ORDER — INSULIN LISPRO (1 UNIT DIAL) 100 UNIT/ML (KWIKPEN): 7.0000 [IU] | PEN_INJECTOR | Freq: Three times a day (TID) | SUBCUTANEOUS | 3 refills | Status: DC

## 2023-05-06 ENCOUNTER — Ambulatory Visit (INDEPENDENT_AMBULATORY_CARE_PROVIDER_SITE_OTHER)
Admission: RE | Admit: 2023-05-06 | Discharge: 2023-05-06 | Disposition: A | Discharge status: Home / Self Care | Payer: Medicaid Other | Source: Ambulatory Visit | Attending: Vascular Surgery

## 2023-05-06 ENCOUNTER — Encounter: Payer: Self-pay | Admitting: Vascular Surgery

## 2023-05-06 ENCOUNTER — Ambulatory Visit (HOSPITAL_COMMUNITY)
Admission: RE | Admit: 2023-05-06 | Discharge: 2023-05-06 | Disposition: A | Discharge status: Home / Self Care | Payer: Medicaid Other | Source: Ambulatory Visit | Attending: Vascular Surgery | Admitting: Vascular Surgery

## 2023-05-06 ENCOUNTER — Ambulatory Visit: Payer: Medicaid Other | Admitting: Vascular Surgery

## 2023-05-06 VITALS — BP 136/91 | HR 93 | Temp 97.7°F | Resp 18 | Ht 68.0 in | Wt 220.3 lb

## 2023-05-06 DIAGNOSIS — I70221 Atherosclerosis of native arteries of extremities with rest pain, right leg: Secondary | ICD-10-CM | POA: Diagnosis not present

## 2023-05-06 DIAGNOSIS — I70213 Atherosclerosis of native arteries of extremities with intermittent claudication, bilateral legs: Secondary | ICD-10-CM

## 2023-05-06 DIAGNOSIS — I70435 Atherosclerosis of autologous vein bypass graft(s) of the right leg with ulceration of other part of foot: Secondary | ICD-10-CM

## 2023-05-06 DIAGNOSIS — I739 Peripheral vascular disease, unspecified: Secondary | ICD-10-CM | POA: Diagnosis present

## 2023-05-06 LAB — VAS US ABI WITH/WO TBI
Left ABI: 0.69
Right ABI: 1.03

## 2023-05-06 NOTE — Progress Notes (Signed)
Patient name: Thomas [name] MRN: [id number] DOB: 1975/06/25 Sex: male  REASON FOR VISIT: 79-month follow-up  HPI: Thomas [name] is a 48 y.o. male with history of diabetes, hypertension, hyperlipidemia, peripheral vascular disease that presents for 49-month interval follow-up.  Patient is well-known to [place] and previously underwent right external iliac stent on 04/19/2019 followed by a right above-knee popliteal to TP trunk bypass with vein on 04/22/2019 for tissue loss.  At a later time in 2023 he required right SFA stenting as well as right TP trunk and PT angioplasty for a threatened bypass.  He is also had left SFA and BK popliteal as well as PT angioplasty 2022 for claudication.  Today he denies any lower extremity complaints.  No active tissue loss.  States he is not smoking.  Taking aspirin Plavix.  Past Medical History:  Diagnosis Date   Depression 01/27/2015   Diabetes mellitus without complication (HCC)    Hyperlipidemia    Hypertension    Peripheral arterial disease (HCC)    Peripheral vascular disease (HCC)     Past Surgical History:  Procedure Laterality Date   ABDOMINAL AORTOGRAM W/LOWER EXTREMITY Right 04/19/2019   Procedure: ABDOMINAL AORTOGRAM W/LOWER EXTREMITY;  Surgeon: [name], MD;  Location: [facility];  Service: Cardiovascular;  Laterality: Right;   ABDOMINAL AORTOGRAM W/LOWER EXTREMITY Right 09/16/2019   Procedure: ABDOMINAL AORTOGRAM W/LOWER EXTREMITY;  Surgeon: [name], MD;  Location: [facility];  Service: Cardiovascular;  Laterality: Right;   ABDOMINAL AORTOGRAM W/LOWER EXTREMITY N/A 08/17/2020   Procedure: ABDOMINAL AORTOGRAM W/LOWER EXTREMITY;  Surgeon: [name], MD;  Location: [facility];  Service: Cardiovascular;  Laterality: N/A;   ABDOMINAL AORTOGRAM W/LOWER EXTREMITY N/A 12/27/2021   Procedure: ABDOMINAL AORTOGRAM W/LOWER EXTREMITY;  Surgeon: [name], MD;  Location: [facility];   Service: Cardiovascular;  Laterality: N/A;   FEMORAL-TIBIAL BYPASS GRAFT Right 04/22/2019   Procedure: Right  FEMORAL-TIBIAL ARTERY BYPASS GRAFT with Harvest of Saphenous vein RIGHT SUPERFICIAL FEMORAL ARTERY TO TIBIOPERONEAL TRUNK BYPASS;  Surgeon: [name], MD;  Location: [facility];  Service: Vascular;  Laterality: Right;   IR CERVICAL/THORACIC DISC ASPIRATION W/IMAG GUIDE  04/15/2019   IR FLUORO GUIDED NEEDLE PLC ASPIRATION/INJECTION LOC  04/15/2019   kidney stones  2009   PERIPHERAL VASCULAR BALLOON ANGIOPLASTY Right 09/16/2019   Procedure: PERIPHERAL VASCULAR BALLOON ANGIOPLASTY;  Surgeon: [name], MD;  Location: [facility];  Service: Cardiovascular;  Laterality: Right;  ext iliac DCB   PERIPHERAL VASCULAR BALLOON ANGIOPLASTY  08/17/2020   Procedure: PERIPHERAL VASCULAR BALLOON ANGIOPLASTY;  Surgeon: [name], MD;  [facility]: [facility];  Service: Cardiovascular;;  Right TP trunk   PERIPHERAL VASCULAR BALLOON ANGIOPLASTY Left 08/24/2020   Procedure: PERIPHERAL VASCULAR BALLOON ANGIOPLASTY;  Surgeon: [name], MD;  [facility]: [facility];  Service: Cardiovascular;  Laterality: Left;  Tibioperoneal trunk and superficial femoral arteries.   PERIPHERAL VASCULAR BALLOON ANGIOPLASTY  12/27/2021   Procedure: PERIPHERAL VASCULAR BALLOON ANGIOPLASTY;  Surgeon: [name], MD;  [facility]: [facility];  Service: Cardiovascular;;   PERIPHERAL VASCULAR INTERVENTION  04/19/2019   Procedure: PERIPHERAL VASCULAR INTERVENTION;  Surgeon: [name], MD;  [facility]: [facility];  Service: Cardiovascular;;  right external iliac   PERIPHERAL VASCULAR INTERVENTION  12/27/2021   Procedure: PERIPHERAL VASCULAR INTERVENTION;  Surgeon: [name], MD;  [facility]: [facility];  Service: Cardiovascular;;   TEE WITHOUT  CARDIOVERSION N/A 04/19/2019   Procedure: TRANSESOPHAGEAL ECHOCARDIOGRAM (TEE);  Surgeon: [name], MD;  [facility];  Service: Endoscopy;  Laterality: N/A;    Family History  Problem Relation Age of Onset   Colon cancer Neg Hx    Esophageal cancer Neg Hx    Pancreatic cancer Neg Hx    Liver cancer Neg Hx    Stomach cancer Neg Hx     SOCIAL HISTORY: Social History   Tobacco Use   Smoking status: Former    Current packs/day: 0.00    Types: Cigarettes    Quit date: 01/13/2022    Years since quitting: 1.3    Passive exposure: Never   Smokeless tobacco: Former   Tobacco comments:    2 cigarrettes a day  Substance Use Topics   Alcohol use: No    Alcohol/week: 0.0 standard drinks of alcohol    No Known Allergies  Current Outpatient Medications  Medication Sig Dispense Refill   acetaminophen (TYLENOL) 500 MG tablet Take 500 mg by mouth 2 (two) times daily.     aspirin EC 81 MG tablet Take 81 mg by mouth daily. Swallow whole.     atorvastatin (LIPITOR) 20 MG tablet Take 1 tablet (20 mg total) by mouth daily. 60 tablet 0   clopidogrel (PLAVIX) 75 MG tablet Take 1 tablet (75 mg total) by mouth daily with breakfast. 90 tablet 2   Continuous Glucose Sensor (DEXCOM G7 SENSOR) MISC 1 Device by Does not apply route as directed. 9 each 3   doxycycline (VIBRA-TABS) 100 MG tablet Take 1 tablet (100 mg total) by mouth 2 (two) times daily. 20 tablet 0   doxycycline (VIBRA-TABS) 100 MG tablet Take 1 tablet (100 mg total) by mouth 2 (two) times daily. 20 tablet 0   empagliflozin (JARDIANCE) 25 MG TABS tablet Take 1 tablet (25 mg total) by mouth daily before breakfast. 90 tablet 3   Heating Pads PADS Use over the counter heating pads as needed for back pain 1 Units 5   insulin glargine (LANTUS SOLOSTAR) 100 UNIT/ML Solostar Pen Inject 32 Units into the skin daily. 30 mL 3   insulin lispro (HUMALOG KWIKPEN) 100 UNIT/ML KwikPen Inject 7 Units into the skin 3 (three) times daily. 30 mL 3   Insulin Pen Needle 32G X 4 MM MISC 1 Device by Does not apply route in the  morning, at noon, in the evening, and at bedtime. 400 each 3   lisinopril (ZESTRIL) 20 MG tablet Take 1 tablet (20 mg total) by mouth daily. 90 tablet 3   omeprazole (PRILOSEC) 20 MG capsule TAKE 1 CAPSULE BY MOUTH DAILY 90 capsule 0   silver sulfADIAZINE (SILVADENE) 1 % cream Apply 1 Application topically daily. 50 g 0   tadalafil (CIALIS) 5 MG tablet Take 1 tablet (5 mg total) by mouth daily as needed for erectile dysfunction. 30 tablet 0   No current facility-administered medications for this visit.    REVIEW OF SYSTEMS:  [X]  denotes positive finding, [ ]  denotes negative finding Cardiac  Comments:  Chest pain or chest pressure:    Shortness of breath upon exertion:    Short of breath when lying flat:    Irregular heart rhythm:        Vascular    Pain in calf, thigh, or hip brought on by ambulation:    Pain in feet at night that wakes you up from your sleep:     Blood clot in your veins:  Leg swelling:         Pulmonary    Oxygen at home:    Productive cough:     Wheezing:         Neurologic    Sudden weakness in arms or legs:     Sudden numbness in arms or legs:     Sudden onset of difficulty speaking or slurred speech:    Temporary loss of vision in one eye:     Problems with dizziness:         Gastrointestinal    Blood in stool:     Vomited blood:         Genitourinary    Burning when urinating:     Blood in urine:        Psychiatric    Major depression:         Hematologic    Bleeding problems:    Problems with blood clotting too easily:        Skin    Rashes or ulcers:        Constitutional    Fever or chills:      PHYSICAL EXAM: Vitals:   05/06/23 1010  BP: (!) 136/91  Pulse: 93  Resp: 18  Temp: 97.7 F (36.5 C)  TempSrc: Temporal  SpO2: 97%  Weight: 220 lb 4.8 oz (99.9 kg)  Height: 5\' 8"  (1.727 m)    GENERAL: The patient is a well-nourished male, in no acute distress. The vital signs are documented above. CARDIAC: There is a regular  rate and rhythm.  VASCULAR:  Bilateral femoral pulses palpable No palpable pedal pulses No tissue loss. PULMONARY: No respiratory distress. ABDOMEN: Soft and non-tender. MUSCULOSKELETAL: There are no major deformities or cyanosis. NEUROLOGIC: No focal weakness or paresthesias are detected. SKIN: There are no ulcers or rashes noted. PSYCHIATRIC: The patient has a normal affect.  DATA:   Lower extremity arterial duplex shows concern for over 50% stenosis in the distal stent on the right SFA with a velocity of 215.  Some sluggish flow in the right above-knee popliteal to TP trunk bypass with velocities in the 30s.  Moderate left SFA mid stenosis.  ABIs today are 1.03 on the right triphasic and 0.69 on the left bipahsic   Assessment/Plan:  48 y.o. male with history of diabetes, hypertension, hyperlipidemia, peripheral vascular disease that presents for 30-month interval follow-up.  Patient is well-known to [place] and previously underwent right external iliac stent on 04/19/2019 followed by a right above-knee popliteal to TP trunk bypass with vein on 04/22/2019 for tissue loss.  At a later time in 2023 he required right SFA stenting as well as right TP trunk and PT angioplasty for a threatened bypass.  He is also had left SFA and BK popliteal as well as PT angioplasty 2022 for intermittent claudication.  Biggest concern today is he does have some sluggish flow in his right leg bypass with some concern in the SFA stent although velocities are not terrible at 215.  He has no lower extremity complaints.  I discussed the option of angiogram to further evaluate his right SFA stent and right leg bypass versus continued surveillance.  Ultimately he decided on continued surveillance.  I will see him in 3 months with repeat lower extremity arterial duplex and ABIs.  He needs to remain on aspirin statin Plavix.  He is not smoking.   [name], MD [facility] Office:  [phone]

## 2023-05-08 ENCOUNTER — Encounter: Payer: Medicaid Other | Attending: Internal Medicine | Admitting: Dietician

## 2023-05-08 ENCOUNTER — Encounter: Payer: Self-pay | Admitting: Dietician

## 2023-05-08 DIAGNOSIS — E1165 Type 2 diabetes mellitus with hyperglycemia: Secondary | ICD-10-CM | POA: Diagnosis not present

## 2023-05-08 DIAGNOSIS — Z794 Long term (current) use of insulin: Secondary | ICD-10-CM | POA: Insufficient documentation

## 2023-05-08 NOTE — Patient Instructions (Signed)
Patient Instructions  Keep up your good habits!             Continue to walk             Be mindful of your portion sizes Avoid sugar in your tea Find ways to cope with stress             Exercise             Pray             Enjoy nature Use the Dexcom when your insurance covers this.  I have sent a request for the prior authorization to the medical assistants here.

## 2023-05-08 NOTE — Progress Notes (Signed)
Diabetes Self-Management Education  Visit Type: Follow-up  Appt. Start Time: 0915 Appt. End Time: 0945  05/08/2023  Mr. Dwana Curd, identified by name and date of birth, is a 48 y.o. male with a diagnosis of Diabetes:  .   ASSESSMENT Patient is here today with an Arabic Interpretor Pamella Pert.from [facility].  He was last seen by myself 12/05/2022.  Wound on toe is healed (osteomyelitis). He is not aware of any new A1C and I cannot find this in my system. He states that insurance did not approve of the Dexcom G7 and needs a prior authorization.  (Confirmed by pharmacy today.)   This was address in 02/04/2023 with [facility] staff.  Have messaged them again. Checks his blood glucose once each am or if he does not feel well. Fasting blood glucose 110 and higher if he eats supper.  Average blood glucose 134 over the past 7 days and 172 over the past 14 days, 186 over the past 30 days. He states that he has been mindful about his eating. He states that his stress is fair and denies stress eating at this time. States that he continues to walk most every day. States that he has continued to avoid smoking and is not vaping.  Weight hx: 68" 222 lbs 05/08/23 213 lbs 12/05/2022 214 lbs 11/07/2022 224 lbs 05/09/2022 245 lbs 01/11/2022   History includes Type 2 Diabetes (2008), HTN, dyslipidemia, PVD, PVD, depression, dentures Smokes, eats 1-2 meals per day. Labs: 6.5% 03/04/2022 decreased from A1C 7.5% 08/31/2021 and Cholesterol 111, HDL 40, LDL 58, Triglycerides 68 on 12/24/2022 Medication includes Lantus 32 units each am, Humalog 8 units before each meal, Jardiance, probiotic, vitamin B-12 CGM:  Dexcom G7 (currently not wearing as his insurance needs a prior authorization.)   CGM Results from download:  11/2022  % Time CGM active:   79 %   (Goal >70%)  Average glucose:   116 mg/dL for 14 days  Glucose management indicator:   N/A  Time in range (70-180 mg/dL):   89 %   (Goal >16%)   Time High (181-250 mg/dL):   7 %   (Goal < 10%)  Time Very High (>250 mg/dL):    0 %   (Goal < 5%)  Time Low (54-69 mg/dL):   4 %   (Goal <9%)  Time Very Low (<54 mg/dL):   <1 %   (Goal <6%)  %CV (glucose variability)    36    Patient lives alone.  He is from [place]. Patient states that he has not worked since 2020 due to health and injuries.  He states that not working has caused him depression.  He does feel alone as his friends had to leave as he cannot support them. He is not on disability. He does receive food stamps. 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 Walk:  Walks 30-60 minutes daily inside at [place] or outside on nice days.    Diabetes Self-Management Education - 05/08/23 1344       Visit Information   Visit Type Follow-up      Health Coping   How would you rate your overall health? Good      Psychosocial Assessment   Patient Belief/Attitude about Diabetes Motivated to manage diabetes    What is the hardest part about your diabetes right now, causing you the most concern, or is the most worrisome to you about your diabetes?   Making healty food  and beverage choices    Self-care barriers English as a second language    Self-management support Doctor's office;CDE visits    Other persons present Patient;Interpreter    Patient Concerns Nutrition/Meal planning;Problem Solving;Glycemic Control;Monitoring    Special Needs Simplified materials    Preferred Learning Style Auditory    Learning Readiness Ready      Pre-Education Assessment   Patient understands the diabetes disease and treatment process. Comprehends key points    Patient understands incorporating nutritional management into lifestyle. Needs Review    Patient undertands incorporating physical activity into lifestyle. Comprehends key points    Patient understands using medications safely. Comprehends key points    Patient understands monitoring blood glucose, interpreting and using results  Comprehends key points    Patient understands prevention, detection, and treatment of acute complications. Comprehends key points    Patient understands prevention, detection, and treatment of chronic complications. Compreheands key points    Patient understands how to develop strategies to address psychosocial issues. Needs Review    Patient understands how to develop strategies to promote health/change behavior. Needs Review      Complications   How often do you check your blood sugar? 1-2 times/day    Fasting Blood glucose range (mg/dL) 21-308    Postprandial Blood glucose range (mg/dL) 657-846;962-952    Number of hyperglycemic episodes ( >200mg /dL): Occasional      Dietary Intake   Breakfast cheese, bread    Lunch air fried fish, rice, salad    Dinner occasional apple    Beverage(s) water. jpt tea wotj 1 1/2 Tlbs sugar      Activity / Exercise   Activity / Exercise Type Light (walking / raking leaves)    How many days per week do you exercise? 5    How many minutes per day do you exercise? 30    Total minutes per week of exercise 150      Patient Education   Previous Diabetes Education Yes (please comment)   11/2022   Healthy Eating Meal options for control of blood glucose level and chronic complications.    Being Active Role of exercise on diabetes management, blood pressure control and cardiac health.    Medications Reviewed patients medication for diabetes, action, purpose, timing of dose and side effects.    Diabetes Stress and Support Worked with patient to identify barriers to care and solutions;Identified and addressed patients feelings and concerns about diabetes      Individualized Goals (developed by patient)   Nutrition General guidelines for healthy choices and portions discussed    Physical Activity Exercise 5-7 days per week;30 minutes per day    Medications take my medication as prescribed    Monitoring  Test my blood glucose as discussed      Patient  Self-Evaluation of Goals - Patient rates self as meeting previously set goals (% of time)   Nutrition >75% (most of the time)    Physical Activity >75% (most of the time)    Medications >75% (most of the time)    Monitoring >75% (most of the time)    Problem Solving and behavior change strategies  >75% (most of the time)    Reducing Risk (treating acute and chronic complications) >75% (most of the time)    Health Coping >75% (most of the time)      Post-Education Assessment   Patient understands the diabetes disease and treatment process. Comprehends key points    Patient understands incorporating nutritional management into  lifestyle. Needs Review    Patient undertands incorporating physical activity into lifestyle. Demonstrates understanding / competency    Patient understands using medications safely. Demonstrates understanding / competency    Patient understands monitoring blood glucose, interpreting and using results Demonstrates understanding / competency    Patient understands prevention, detection, and treatment of acute complications. Demonstrates understanding / competency    Patient understands prevention, detection, and treatment of chronic complications. Demonstrates understanding / competency    Patient understands how to develop strategies to address psychosocial issues. Comprehends key points    Patient understands how to develop strategies to promote health/change behavior. Comprehends key points      Outcomes   Expected Outcomes Demonstrated interest in learning. Expect positive outcomes    Future DMSE 3-4 months    Program Status Not Completed      Subsequent Visit   Since your last visit have you continued or begun to take your medications as prescribed? Yes    Since your last visit have you experienced any weight changes? Gain    Weight Gain (lbs) 9             Individualized Plan for Diabetes Self-Management Training:   Learning Objective:  Patient will  have a greater understanding of diabetes self-management. Patient education plan is to attend individual and/or group sessions per assessed needs and concerns.   Plan:   Patient Instructions   Patient Instructions  Keep up your good habits!             Continue to walk             Be mindful of your portion sizes Avoid sugar in your tea Find ways to cope with stress             Exercise             Pray             Enjoy nature Use the Dexcom when your insurance covers this.  I have sent a request for the prior authorization to the medical assistants here.      Expected Outcomes:  Demonstrated interest in learning. Expect positive outcomes  Education material provided:   If problems or questions, patient to contact team via:  Phone  Future DSME appointment: 3-4 months

## 2023-05-27 ENCOUNTER — Other Ambulatory Visit: Payer: Self-pay

## 2023-05-27 DIAGNOSIS — I739 Peripheral vascular disease, unspecified: Secondary | ICD-10-CM

## 2023-06-13 ENCOUNTER — Telehealth: Payer: Self-pay

## 2023-06-13 ENCOUNTER — Other Ambulatory Visit (HOSPITAL_COMMUNITY): Payer: Self-pay

## 2023-06-13 NOTE — Telephone Encounter (Signed)
 Pharmacy Patient Advocate Encounter   Received notification from RX Request Messages that prior authorization for Dexcom G7 sensor is required/requested.   Insurance verification completed.   The patient is insured through [facility] .   Per test claim: The current 30 day co-pay is, $0.  No PA needed at this time. This test claim was processed through [facility]- copay amounts may vary at other pharmacies due to pharmacy/plan contracts, or as the patient moves through the different stages of their insurance plan.

## 2023-06-23 ENCOUNTER — Ambulatory Visit: Payer: Medicaid Other | Admitting: Podiatry

## 2023-06-25 ENCOUNTER — Other Ambulatory Visit: Payer: Self-pay | Admitting: Medical

## 2023-06-26 ENCOUNTER — Encounter: Payer: Self-pay | Admitting: Podiatry

## 2023-06-26 ENCOUNTER — Ambulatory Visit (INDEPENDENT_AMBULATORY_CARE_PROVIDER_SITE_OTHER)

## 2023-06-26 ENCOUNTER — Ambulatory Visit: Payer: Medicaid Other | Admitting: Podiatry

## 2023-06-26 DIAGNOSIS — M86671 Other chronic osteomyelitis, right ankle and foot: Secondary | ICD-10-CM

## 2023-06-28 NOTE — Progress Notes (Signed)
  Subjective:   Patient ID: Thomas [name], male   DOB: 48 y.o.   MRN: [id number]   HPI Chief Complaint  Patient presents with   Diabetic Ulcer    RM#11 Follow up right root third toe ulcer appears in a good healing state.      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  States he is doing well.  Denies any open lesions.  No drainage.  Does not report any fevers chills or any pain.  He has no other concerns. Objective:  Physical Exam  General: AAO x3, NAD-translator present  Dermatological: Minimal callus to the distal aspect of the right third toe there is no underlying ulceration noted.  There is some trace chronic edema but is no erythema or warmth.  No increased temperature.  There is no areas of fluctuance or crepitation.  Overall clinically appears to be much improved.    Vascular: Appropriately warm and well-perfused.  Neruologic: Sensation decreased  Musculoskeletal: Digital contracture.  No pain on exam.     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  There is stable cortical changes along the distal phalanx.  No evidence of acute fracture. -There is very minimal calcific debrided as a courtesy without any complications or bleeding.  There is no ulceration or any signs of infection.  Continue to monitor for any reoccurrence.  Should symptoms recur will need to have amputation but she is well aware.  Return in about 3 months (around 09/26/2023) for chronic bone infection, x-ray.  Vivi Barrack DPM

## 2023-07-09 DIAGNOSIS — H532 Diplopia: Secondary | ICD-10-CM | POA: Diagnosis not present

## 2023-07-14 ENCOUNTER — Other Ambulatory Visit (HOSPITAL_COMMUNITY): Payer: Self-pay

## 2023-07-22 ENCOUNTER — Encounter: Payer: Self-pay | Admitting: Vascular Surgery

## 2023-07-22 ENCOUNTER — Ambulatory Visit: Payer: Medicaid Other | Admitting: Vascular Surgery

## 2023-07-22 ENCOUNTER — Ambulatory Visit (HOSPITAL_COMMUNITY)
Admission: RE | Admit: 2023-07-22 | Discharge: 2023-07-22 | Disposition: A | Discharge status: Home / Self Care | Payer: Medicaid Other | Source: Ambulatory Visit | Attending: Vascular Surgery | Admitting: Vascular Surgery

## 2023-07-22 ENCOUNTER — Ambulatory Visit (INDEPENDENT_AMBULATORY_CARE_PROVIDER_SITE_OTHER)
Admission: RE | Admit: 2023-07-22 | Discharge: 2023-07-22 | Disposition: A | Discharge status: Home / Self Care | Payer: Medicaid Other | Source: Ambulatory Visit | Attending: Vascular Surgery | Admitting: Vascular Surgery

## 2023-07-22 VITALS — BP 143/86 | HR 89 | Temp 98.0°F | Resp 18 | Ht 68.0 in | Wt 219.1 lb

## 2023-07-22 DIAGNOSIS — I739 Peripheral vascular disease, unspecified: Secondary | ICD-10-CM

## 2023-07-22 DIAGNOSIS — I70221 Atherosclerosis of native arteries of extremities with rest pain, right leg: Secondary | ICD-10-CM

## 2023-07-22 LAB — VAS US ABI WITH/WO TBI
Left ABI: 0.64
Right ABI: 0.99

## 2023-07-22 NOTE — Progress Notes (Signed)
 Patient name: Thomas [name] MRN: [id number] DOB: 12-29-1975 Sex: male  REASON FOR VISIT: 83-month follow-up, lower extremity surveillance for PAD  HPI: Thomas [name] is a 48 y.o. male with history of diabetes, hypertension, hyperlipidemia, peripheral vascular disease that presents for 77-month interval follow-up for his PAD.  Patient is well-known to [place] and previously underwent right external iliac stent on 04/19/2019 followed by a right above-knee popliteal to TP trunk bypass with vein on 04/22/2019 for tissue loss.  At a later time in 2023 he required right SFA stenting as well as right TP trunk and PT angioplasty for a threatened bypass.  He is also had left SFA and BK popliteal as well as PT angioplasty 2022 for claudication.  Today he denies any lower extremity complaints.  No active tissue loss.  States he is not smoking.  Taking aspirin Plavix.  I did recommend short interval follow-up given concern for stenosis on prior imaging.  Past Medical History:  Diagnosis Date   Depression 01/27/2015   Diabetes mellitus without complication (HCC)    Hyperlipidemia    Hypertension    Peripheral arterial disease (HCC)    Peripheral vascular disease (HCC)     Past Surgical History:  Procedure Laterality Date   ABDOMINAL AORTOGRAM W/LOWER EXTREMITY Right 04/19/2019   Procedure: ABDOMINAL AORTOGRAM W/LOWER EXTREMITY;  Surgeon: [name], MD;  [facility];  Service: Cardiovascular;  Laterality: Right;   ABDOMINAL AORTOGRAM W/LOWER EXTREMITY Right 09/16/2019   Procedure: ABDOMINAL AORTOGRAM W/LOWER EXTREMITY;  Surgeon: [name], MD;  [facility];  Service: Cardiovascular;  Laterality: Right;   ABDOMINAL AORTOGRAM W/LOWER EXTREMITY N/A 08/17/2020   Procedure: ABDOMINAL AORTOGRAM W/LOWER EXTREMITY;  Surgeon: [name], MD;  [facility];  Service: Cardiovascular;  Laterality: N/A;   ABDOMINAL AORTOGRAM W/LOWER EXTREMITY N/A  12/27/2021   Procedure: ABDOMINAL AORTOGRAM W/LOWER EXTREMITY;  Surgeon: [name], MD;  [facility];  Service: Cardiovascular;  Laterality: N/A;   FEMORAL-TIBIAL BYPASS GRAFT Right 04/22/2019   Procedure: Right  FEMORAL-TIBIAL ARTERY BYPASS GRAFT with Harvest of Saphenous vein RIGHT SUPERFICIAL FEMORAL ARTERY TO TIBIOPERONEAL TRUNK BYPASS;  Surgeon: [name], MD;  [facility];  Service: Vascular;  Laterality: Right;   IR CERVICAL/THORACIC DISC ASPIRATION W/IMAG GUIDE  04/15/2019   IR FLUORO GUIDED NEEDLE PLC ASPIRATION/INJECTION LOC  04/15/2019   kidney stones  2009   PERIPHERAL VASCULAR BALLOON ANGIOPLASTY Right 09/16/2019   Procedure: PERIPHERAL VASCULAR BALLOON ANGIOPLASTY;  Surgeon: [name], MD;  [facility];  Service: Cardiovascular;  Laterality: Right;  ext iliac DCB   PERIPHERAL VASCULAR BALLOON ANGIOPLASTY  08/17/2020   Procedure: PERIPHERAL VASCULAR BALLOON ANGIOPLASTY;  Surgeon: [name], MD;  [facility];  Service: Cardiovascular;;  Right TP trunk   PERIPHERAL VASCULAR BALLOON ANGIOPLASTY Left 08/24/2020   Procedure: PERIPHERAL VASCULAR BALLOON ANGIOPLASTY;  Surgeon: [name], MD;  [facility];  Service: Cardiovascular;  Laterality: Left;  Tibioperoneal trunk and superficial femoral arteries.   PERIPHERAL VASCULAR BALLOON ANGIOPLASTY  12/27/2021   Procedure: PERIPHERAL VASCULAR BALLOON ANGIOPLASTY;  Surgeon: [name], MD;  [facility];  Service: Cardiovascular;;   PERIPHERAL VASCULAR INTERVENTION  04/19/2019   Procedure: PERIPHERAL VASCULAR INTERVENTION;  Surgeon: [name], MD;  [facility];  Service: Cardiovascular;;  right external iliac   PERIPHERAL VASCULAR INTERVENTION  12/27/2021   Procedure:  PERIPHERAL VASCULAR INTERVENTION;  Surgeon: [name], MD;  Location: [facility] INVAS[facility] CV LAB;  Service:  Cardiovascular;;   TEE WITHOUT CARDIOVERSION N/A 04/19/2019   Procedure: TRANSESOPHAGEAL ECHOCARDIOGRAM (TEE);  Surgeon: [name], MD;  Location: [facility] ENDOSCOPY;  Service: Endoscopy;  Laterality: N/A;    Family History  Problem Relation Age of Onset   Colon cancer Neg Hx    Esophageal cancer Neg Hx    Pancreatic cancer Neg Hx    Liver cancer Neg Hx    Stomach cancer Neg Hx     SOCIAL HISTORY: Social History   Tobacco Use   Smoking status: Former    Current packs/day: 0.00    Types: Cigarettes    Quit date: 01/13/2022    Years since quitting: 1.5    Passive exposure: Never   Smokeless tobacco: Former   Tobacco comments:    2 cigarrettes a day  Substance Use Topics   Alcohol use: No    Alcohol/week: 0.0 standard drinks of alcohol    No Known Allergies  Current Outpatient Medications  Medication Sig Dispense Refill   lisinopril (ZESTRIL) 20 MG tablet Take 1 tablet (20 mg total) by mouth daily. 90 tablet 3   omeprazole (PRILOSEC) 20 MG capsule TAKE 1 CAPSULE BY MOUTH DAILY 90 capsule 0   acetaminophen (TYLENOL) 500 MG tablet Take 500 mg by mouth 2 (two) times daily.     aspirin EC 81 MG tablet Take 81 mg by mouth daily. Swallow whole.     atorvastatin (LIPITOR) 20 MG tablet Take 1 tablet (20 mg total) by mouth daily. 30 tablet 0   Bacillus Coagulans-Inulin (PROBIOTIC) 1-250 BILLION-MG CAPS Take by mouth.     clopidogrel (PLAVIX) 75 MG tablet Take 1 tablet (75 mg total) by mouth daily with breakfast. 90 tablet 2   Continuous Glucose Sensor (DEXCOM G7 SENSOR) MISC 1 Device by Does not apply route as directed. 9 each 3   cyanocobalamin (VITAMIN B12) 1000 MCG tablet Take 1,000 mcg by mouth daily.     doxycycline (VIBRA-TABS) 100 MG tablet Take 1 tablet (100 mg total) by mouth 2 (two) times daily. 20 tablet 0   doxycycline (VIBRA-TABS) 100 MG tablet Take 1 tablet (100 mg total) by mouth 2 (two) times daily. (Patient not taking: Reported on 07/22/2023) 20 tablet 0    empagliflozin (JARDIANCE) 25 MG TABS tablet Take 1 tablet (25 mg total) by mouth daily before breakfast. 90 tablet 3   Heating Pads PADS Use over the counter heating pads as needed for back pain (Patient not taking: Reported on 07/22/2023) 1 Units 5   insulin glargine (LANTUS SOLOSTAR) 100 UNIT/ML Solostar Pen Inject 32 Units into the skin daily. 30 mL 3   insulin lispro (HUMALOG KWIKPEN) 100 UNIT/ML KwikPen Inject 7 Units into the skin 3 (three) times daily. 30 mL 3   Insulin Pen Needle 32G X 4 MM MISC 1 Device by Does not apply route in the morning, at noon, in the evening, and at bedtime. 400 each 3   silver sulfADIAZINE (SILVADENE) 1 % cream Apply 1 Application topically daily. 50 g 0   tadalafil (CIALIS) 5 MG tablet Take 1 tablet (5 mg total) by mouth daily as needed for erectile dysfunction. 30 tablet 0   No current facility-administered medications for this visit.    REVIEW OF SYSTEMS:  [X]  denotes positive finding, [ ]  denotes negative finding Cardiac  Comments:  Chest pain or chest pressure:    Shortness of breath  upon exertion:    Short of breath when lying flat:    Irregular heart rhythm:        Vascular    Pain in calf, thigh, or hip brought on by ambulation:    Pain in feet at night that wakes you up from your sleep:     Blood clot in your veins:    Leg swelling:         Pulmonary    Oxygen at home:    Productive cough:     Wheezing:         Neurologic    Sudden weakness in arms or legs:     Sudden numbness in arms or legs:     Sudden onset of difficulty speaking or slurred speech:    Temporary loss of vision in one eye:     Problems with dizziness:         Gastrointestinal    Blood in stool:     Vomited blood:         Genitourinary    Burning when urinating:     Blood in urine:        Psychiatric    Major depression:         Hematologic    Bleeding problems:    Problems with blood clotting too easily:        Skin    Rashes or ulcers:         Constitutional    Fever or chills:      PHYSICAL EXAM: Vitals:   07/22/23 1130  BP: (!) 143/86  Pulse: 89  Resp: 18  Temp: 98 F (36.7 C)  TempSrc: Temporal  Weight: 219 lb 1.6 oz (99.4 kg)  Height: 5\' 8"  (1.727 m)    GENERAL: The patient is a well-nourished male, in no acute distress. The vital signs are documented above. CARDIAC: There is a regular rate and rhythm.  VASCULAR:  Bilateral femoral pulses palpable No palpable pedal pulses No tissue loss. PULMONARY: No respiratory distress. ABDOMEN: Soft and non-tender. MUSCULOSKELETAL: There are no major deformities or cyanosis. NEUROLOGIC: No focal weakness or paresthesias are detected. SKIN: There are no ulcers or rashes noted. PSYCHIATRIC: The patient has a normal affect.  DATA:   Lower extremity arterial duplex today does show high-grade stenosis of the right SFA stent proximal to the bypass with a velocity of 342; right external iliac stent patent without stenosis; patent right leg bypass    ABIs today are 0.99 on the right biphasic and 0.64 on the left monophasic  Assessment/Plan:  48 y.o. male with history of diabetes, hypertension, hyperlipidemia, peripheral vascular disease that presents for 57-month interval follow-up.  Patient is well-known to [place] and previously underwent right external iliac stent on 04/19/2019 followed by a right above-knee popliteal to TP trunk bypass with vein on 04/22/2019 for tissue loss.  At a later time in 2023 he required right SFA stenting as well as right TP trunk and PT angioplasty for a threatened bypass.  He is also had left SFA and BK popliteal as well as PT angioplasty 2022 for intermittent claudication.  On 37-month interval follow-up today he has a higher velocity with increasing stenosis in his right SFA stent with some sluggish flow in the bypass.  I have recommended returning to the Cath Lab for aortogram lower extremity arteriogram with a focus on the right leg.  Discussed this  would likely be SFA angioplasty versus additional stent placement for in-stent stenosis.  Will  get scheduled in the Cath Lab.  Risk-benefit discussed.  Asked that he stay on aspirin Plavix statin.   [name], MD [facility] Office: [phone]

## 2023-07-23 ENCOUNTER — Other Ambulatory Visit: Payer: Self-pay

## 2023-07-23 DIAGNOSIS — I70221 Atherosclerosis of native arteries of extremities with rest pain, right leg: Secondary | ICD-10-CM

## 2023-08-05 ENCOUNTER — Other Ambulatory Visit: Payer: Self-pay | Admitting: Medical

## 2023-08-06 ENCOUNTER — Encounter: Payer: Self-pay | Admitting: Internal Medicine

## 2023-08-06 ENCOUNTER — Ambulatory Visit: Payer: Medicaid Other | Admitting: Internal Medicine

## 2023-08-06 VITALS — BP 122/80 | HR 96 | Ht 68.0 in | Wt 217.4 lb

## 2023-08-06 DIAGNOSIS — E1151 Type 2 diabetes mellitus with diabetic peripheral angiopathy without gangrene: Secondary | ICD-10-CM | POA: Diagnosis not present

## 2023-08-06 DIAGNOSIS — E0590 Thyrotoxicosis, unspecified without thyrotoxic crisis or storm: Secondary | ICD-10-CM | POA: Diagnosis not present

## 2023-08-06 DIAGNOSIS — E0500 Thyrotoxicosis with diffuse goiter without thyrotoxic crisis or storm: Secondary | ICD-10-CM

## 2023-08-06 DIAGNOSIS — Z794 Long term (current) use of insulin: Secondary | ICD-10-CM | POA: Diagnosis not present

## 2023-08-06 DIAGNOSIS — K219 Gastro-esophageal reflux disease without esophagitis: Secondary | ICD-10-CM

## 2023-08-06 LAB — POCT GLYCOSYLATED HEMOGLOBIN (HGB A1C): Hemoglobin A1C: 6.9 % — AB (ref 4.0–5.6)

## 2023-08-06 LAB — POCT GLUCOSE (DEVICE FOR HOME USE): Glucose Fasting, POC: 109 mg/dL — AB (ref 70–99)

## 2023-08-06 MED ORDER — METHIMAZOLE 5 MG PO TABS: 5.0000 mg | ORAL_TABLET | Freq: Every day | ORAL | 3 refills | Status: DC

## 2023-08-06 NOTE — Progress Notes (Signed)
 Name: Thomas [name]  Age/ Sex: 48 y.o., male   MRN/ DOB: [id number], 1975/11/15     PCP: Francine Iron   Reason for Endocrinology Evaluation: Type 2 Diabetes Mellitus  Initial Endocrine Consultative Visit: 09/15/2020    PATIENT IDENTIFIER: Thomas [name] is a 48 y.o. male with a past medical history of T2DM, HTN, and dyslipidemia. The patient has followed with Endocrinology clinic since 09/15/2020 for consultative assistance with management of his diabetes.  DIABETIC HISTORY:  Thomas [name] was diagnosed with DM in 2008 .  He was on metformin  in the past. His hemoglobin A1c has ranged from 6.7% in 2021, peaking at 15.8% in 2020  On his initial visit with me he had an A1c of 11.3%, he was only on basal insulin , he was having a lot of heartburn, and GI symptoms and we opted to start him on prandial insulin   Patient had connectivity issue with Dexcom, stopped using it 2022-not interested in freestyle libre   Started jardiance  04/2021    THYROID HISTORY: During evaluation for diplopia through ophthalmology 06/2023 patient was noted with hyperthyroidism, with suppressed TSH 0.05 u IU/mL and elevated free T4.  As well as elevated TSI  No family history of thyroid disease   SUBJECTIVE:   During the last visit (03/04/2022): A1c 6.5%     Today (08/06/2023): Thomas [name] is here for follow-up on diabetes management.  He checks his blood sugars multiple  times daily, through the Dexcom, he forgot his receiver today, unable to download information. No recent hypoglycemia     He receives his insulin  through the [facility]  He was evaluated by ophthalmology 06/2023 due to diplopia, brain/orbital MRI was ordered, scheduled 09/02/2023.  Suspected TED, TFTs show hyperthyroid, as well as elevated TSI  He continues to follow up with podiatry (Dr. Mabel Savage) for right third toe ulcer, schedule for sx 5/1 He continues to follow-up with vascular surgery for peripheral  vascular disease   Weight stable  Has heartburn, results with vomiting , uses tums . Omeprazole  didn;t helps  Denies palpitations  Continue with occasional constipation but no diarrhea Denies tremors  Left hand tingling has been improving      HOME DIABETES REGIMEN:  Humalog  8 units with each meal  Lantus  32  units daily  Jardiance  25 mg daily     Statin: Yes ACE-I/ARB: Yes Prior Diabetic Education: Yes    DIABETIC COMPLICATIONS: Microvascular complications:   Denies: CKD, neuropathy, retinopathy Last Eye Exam: Completed 03/2021  Macrovascular complications:  PVD ( S/P right external iliac angioplasty and stent (04/19/2019) followed by bypass 08/2020, left angiogram 08/2022 Denies: CAD, CVA   HISTORY:  Past Medical History:  Past Medical History:  Diagnosis Date   Depression 01/27/2015   Diabetes mellitus without complication (HCC)    Hyperlipidemia    Hypertension    Peripheral arterial disease (HCC)    Peripheral vascular disease (HCC)    Past Surgical History:  Past Surgical History:  Procedure Laterality Date   ABDOMINAL AORTOGRAM W/LOWER EXTREMITY Right 04/19/2019   Procedure: ABDOMINAL AORTOGRAM W/LOWER EXTREMITY;  Surgeon: [name], MD;  [facility];  Service: Cardiovascular;  Laterality: Right;   ABDOMINAL AORTOGRAM W/LOWER EXTREMITY Right 09/16/2019   Procedure: ABDOMINAL AORTOGRAM W/LOWER EXTREMITY;  Surgeon: [name], MD;  [facility];  Service: Cardiovascular;  Laterality: Right;   ABDOMINAL AORTOGRAM W/LOWER EXTREMITY N/A 08/17/2020   Procedure: ABDOMINAL AORTOGRAM W/LOWER EXTREMITY;  Surgeon: [name], MD;  [facility];  Service: Cardiovascular;  Laterality: N/A;   ABDOMINAL AORTOGRAM W/LOWER EXTREMITY N/A 12/27/2021   Procedure: ABDOMINAL AORTOGRAM W/LOWER EXTREMITY;  Surgeon: [name], MD;  [facility];  Service: Cardiovascular;  Laterality:  N/A;   FEMORAL-TIBIAL BYPASS GRAFT Right 04/22/2019   Procedure: Right  FEMORAL-TIBIAL ARTERY BYPASS GRAFT with Harvest of Saphenous vein RIGHT SUPERFICIAL FEMORAL ARTERY TO TIBIOPERONEAL TRUNK BYPASS;  Surgeon: [name], MD;  [facility];  Service: Vascular;  Laterality: Right;   IR CERVICAL/THORACIC DISC ASPIRATION W/IMAG GUIDE  04/15/2019   IR FLUORO GUIDED NEEDLE PLC ASPIRATION/INJECTION LOC  04/15/2019   kidney stones  2009   PERIPHERAL VASCULAR BALLOON ANGIOPLASTY Right 09/16/2019   Procedure: PERIPHERAL VASCULAR BALLOON ANGIOPLASTY;  Surgeon: [name], MD;  [facility];  Service: Cardiovascular;  Laterality: Right;  ext iliac DCB   PERIPHERAL VASCULAR BALLOON ANGIOPLASTY  08/17/2020   Procedure: PERIPHERAL VASCULAR BALLOON ANGIOPLASTY;  Surgeon: [name], MD;  [facility];  Service: Cardiovascular;;  Right TP trunk   PERIPHERAL VASCULAR BALLOON ANGIOPLASTY Left 08/24/2020   Procedure: PERIPHERAL VASCULAR BALLOON ANGIOPLASTY;  Surgeon: [name], MD;  [facility];  Service: Cardiovascular;  Laterality: Left;  Tibioperoneal trunk and superficial femoral arteries.   PERIPHERAL VASCULAR BALLOON ANGIOPLASTY  12/27/2021   Procedure: PERIPHERAL VASCULAR BALLOON ANGIOPLASTY;  Surgeon: [name], MD;  [facility];  Service: Cardiovascular;;   PERIPHERAL VASCULAR INTERVENTION  04/19/2019   Procedure: PERIPHERAL VASCULAR INTERVENTION;  Surgeon: [name], MD;  [facility];  Service: Cardiovascular;;  right external iliac   PERIPHERAL VASCULAR INTERVENTION  12/27/2021   Procedure: PERIPHERAL VASCULAR INTERVENTION;  Surgeon: [name], MD;  [facility];  Service: Cardiovascular;;   TEE WITHOUT CARDIOVERSION N/A 04/19/2019   Procedure: TRANSESOPHAGEAL ECHOCARDIOGRAM (TEE);  Surgeon: Wendie Hamburg, MD;  Location: [facility]  ENDOSCOPY;  Service: Endoscopy;  Laterality: N/A;   Social History:  reports that he quit smoking about 18 months ago. His smoking use included cigarettes. He has never been exposed to tobacco smoke. He has quit using smokeless tobacco. He reports that he does not drink alcohol and does not use drugs. Family History:  Family History  Problem Relation Age of Onset   Colon cancer Neg Hx    Esophageal cancer Neg Hx    Pancreatic cancer Neg Hx    Liver cancer Neg Hx    Stomach cancer Neg Hx      HOME MEDICATIONS: Allergies as of 08/06/2023   No Known Allergies      Medication List        Accurate as of August 06, 2023  4:00 PM. If you have any questions, ask your nurse or doctor.          acetaminophen  500 MG tablet Commonly known as: TYLENOL  Take 500 mg by mouth 2 (two) times daily.   aspirin  EC 81 MG tablet Take 81 mg by mouth daily. Swallow whole.   atorvastatin  20 MG tablet Commonly known as: LIPITOR Take 1 tablet by mouth once daily   clopidogrel  75 MG tablet Commonly known as: PLAVIX  Take 1 tablet (75 mg total) by mouth daily with breakfast.   cyanocobalamin 1000 MCG tablet Commonly known as: VITAMIN B12 Take 1,000 mcg by mouth daily.   Dexcom G7 Sensor Misc 1 Device by Does not apply route as directed.  doxycycline  100 MG tablet Commonly known as: VIBRA -TABS Take 1 tablet (100 mg total) by mouth 2 (two) times daily.   doxycycline  100 MG tablet Commonly known as: VIBRA -TABS Take 1 tablet (100 mg total) by mouth 2 (two) times daily.   empagliflozin  25 MG Tabs tablet Commonly known as: Jardiance  Take 1 tablet (25 mg total) by mouth daily before breakfast.   Heating Pads Pads Use over the counter heating pads as needed for back pain   insulin  lispro 100 UNIT/ML KwikPen Commonly known as: HumaLOG  KwikPen Inject 7 Units into the skin 3 (three) times daily. What changed: how much to take   Insulin  Pen Needle 32G X 4 MM Misc 1 Device by Does not  apply route in the morning, at noon, in the evening, and at bedtime.   Lantus  SoloStar 100 UNIT/ML Solostar Pen Generic drug: insulin  glargine Inject 32 Units into the skin daily.   lisinopril  20 MG tablet Commonly known as: ZESTRIL  Take 1 tablet (20 mg total) by mouth daily.   methimazole  5 MG tablet Commonly known as: TAPAZOLE  Take 1 tablet (5 mg total) by mouth daily. Started by: Waldon Sheerin J Ashea Winiarski   omeprazole  20 MG capsule Commonly known as: PRILOSEC TAKE 1 CAPSULE BY MOUTH DAILY   Probiotic 1-250 BILLION-MG Caps Take by mouth.   silver  sulfADIAZINE  1 % cream Commonly known as: Silvadene  Apply 1 Application topically daily.   tadalafil  5 MG tablet Commonly known as: CIALIS  Take 1 tablet (5 mg total) by mouth daily as needed for erectile dysfunction.         OBJECTIVE:   Vital Signs: BP 122/80 (BP Location: Left Arm, Patient Position: Sitting, Cuff Size: Normal)   Pulse 96   Ht 5\' 8"  (1.727 m)   Wt 217 lb 6.4 oz (98.6 kg)   SpO2 98%   BMI 33.06 kg/m   Wt Readings from Last 3 Encounters:  08/06/23 217 lb 6.4 oz (98.6 kg)  07/22/23 219 lb 1.6 oz (99.4 kg)  05/06/23 220 lb 4.8 oz (99.9 kg)     Exam: General: Pt appears well and is in NAD  Lungs: Clear with good BS bilat   Heart: RRR   Extremities: No pretibial edema.   Neuro: MS is good with appropriate affect, pt is alert and Ox3   DM Foot exam 06/26/2023 per podiatry      DATA REVIEWED:  Lab Results  Component Value Date   HGBA1C 6.9 (A) 08/06/2023   HGBA1C 6.5 (A) 03/04/2022   HGBA1C 7.5 (A) 08/31/2021   Labs 07/09/2023  TSH <0.05 uIU/mL Free T4 2.3 NG/DL TSI 4.09 (8.11-9.14) TPO antibodies 1    Latest Reference Range & Units 12/24/22 08:56  Sodium 135 - 145 mEq/L 137  Potassium 3.5 - 5.1 mEq/L 4.7  Chloride 96 - 112 mEq/L 104  CO2 19 - 32 mEq/L 26  Glucose 70 - 99 mg/dL 782 (H)  BUN 6 - 23 mg/dL 30 (H)  Creatinine 9.56 - 1.50 mg/dL 2.13  Calcium  8.4 - 10.5 mg/dL 9.7   Alkaline Phosphatase 39 - 117 U/L 113  Albumin  3.5 - 5.2 g/dL 4.0  AST 0 - 37 U/L 16  ALT 0 - 53 U/L 23  Total Protein 6.0 - 8.3 g/dL 7.3  Total Bilirubin 0.2 - 1.2 mg/dL 0.4  GFR >08.65 mL/min 89.41     Latest Reference Range & Units 12/24/22 08:56  Total CHOL/HDL Ratio  3  Cholesterol 0 - 200 mg/dL 784  HDL Cholesterol >69.62 mg/dL  40.10  LDL (calc) 0 - 99 mg/dL 58  MICROALB/CREAT RATIO 0.0 - 30.0 mg/g 29.0  NonHDL  71.25  Triglycerides 0.0 - 149.0 mg/dL 86.5  VLDL 0.0 - 78.4 mg/dL 69.6    ASSESSMENT / PLAN / RECOMMENDATIONS:   1) Type 2 Diabetes Mellitus, with improving glycemic control , With neuropathic, retinopathic and macrovascular complications as well as microalbuminuria- Most recent A1c of 6.9%. Goal A1c <7.0%.     -A1c remains at goal -He has been noted with tight BG's in the 70s mg/DL, will decrease basal insulin  as below -No glucose data -A new prescription for Dexcom has been sent with a message to help authorization team to proceed as the patient should be able to meet coverage criteria  MEDICATIONS: Decrease Lantus  32 units daily  Continue HUmalog  7 units with each meal  Continue  Jardiance  25 mg daily   EDUCATION / INSTRUCTIONS: BG monitoring instructions: Patient is instructed to check his blood sugars 2 times daily before meals times a day. Call [place] [facility] if: BG persistently < 70  I reviewed the Rule of 15 for the treatment of hypoglycemia in detail with the patient. Literature supplied.   2) Diabetic complications:  Eye: Does not have known diabetic retinopathy.  Neuro/ Feet: Does  have known diabetic peripheral neuropathy .  Renal: Patient does not have known baseline CKD. He   is  on an ACEI/ARB at present.    3) Dyslipidemia:   -Given peripheral vascular disease, LDL goal <70 mg/dL  -LDL has trended down    Medication Continue atorvastatin  20 mg daily  4) Hyperthyroidism   - Patient is clinically euthyroid -  TFTs were elevated through ophthalmology office, this is under Care Everywhere - I will start methimazole  - Will recheck labs in 6 weeks  Medication  Start methimazole  5 mg daily  5) Graves' Disease:  -TSI elevated - I did explain to the patient that Graves' disease is an autoimmune hereditary condition - He follows with ophthalmology   6)GERD:  - Patient with severe GERD - PPI have not helped him, he is currently on Tums and Gas-X - A referral has been placed to GI for further evaluation and treatment  F/U in 3 months   Signed electronically by: Natale Bail, MD  [facility]  [facility] [address] [place] [place], [place] [address] Phone: [phone] FAX: [phone]   CC: Francine Iron [id number] [place] [address] Phone: [phone]  Fax: [phone]  Return to Endocrinology clinic as below: Future Appointments  Date Time Provider Department Center  09/17/2023 11:30 AM [facility] END[facility]NEURO [facility] None  09/26/2023 10:15 AM Charity Conch, DPM [facility]  11/06/2023  9:00 AM Tommy Frames Audley Leap, RD [facility]  11/06/2023 10:50 AM Brydan Downard, Julian Obey, MD [facility] None

## 2023-08-06 NOTE — Patient Instructions (Addendum)
-   Continue Lantus  32 units daily  - Continue  HUmalog  8 units with meals  - Continue  Jardiance  25 mg daily    Start Methimazole  5 mg daily     HOW TO TREAT LOW BLOOD SUGARS (Blood sugar LESS THAN 70 MG/DL) Please follow the RULE OF 15 for the treatment of hypoglycemia treatment (when your (blood sugars are less than 70 mg/dL)   STEP 1: Take 15 grams of carbohydrates when your blood sugar is low, which includes:  3-4 GLUCOSE TABS  OR 3-4 OZ OF JUICE OR REGULAR SODA OR ONE TUBE OF GLUCOSE GEL    STEP 2: RECHECK blood sugar in 15 MINUTES STEP 3: If your blood sugar is still low at the 15 minute recheck --> then, go back to STEP 1 and treat AGAIN with another 15 grams of carbohydrates.

## 2023-08-14 ENCOUNTER — Observation Stay (HOSPITAL_COMMUNITY)
Admission: RE | Admit: 2023-08-14 | Discharge: 2023-08-15 | Disposition: A | Discharge status: Home / Self Care | Attending: Vascular Surgery | Admitting: Vascular Surgery

## 2023-08-14 ENCOUNTER — Encounter (HOSPITAL_COMMUNITY)
Admission: RE | Disposition: A | Discharge status: Home / Self Care | Payer: Self-pay | Source: Home / Self Care | Attending: Vascular Surgery

## 2023-08-14 ENCOUNTER — Other Ambulatory Visit: Payer: Self-pay

## 2023-08-14 DIAGNOSIS — Z794 Long term (current) use of insulin: Secondary | ICD-10-CM | POA: Diagnosis not present

## 2023-08-14 DIAGNOSIS — Z7902 Long term (current) use of antithrombotics/antiplatelets: Secondary | ICD-10-CM | POA: Insufficient documentation

## 2023-08-14 DIAGNOSIS — E119 Type 2 diabetes mellitus without complications: Secondary | ICD-10-CM | POA: Diagnosis not present

## 2023-08-14 DIAGNOSIS — Y828 Other medical devices associated with adverse incidents: Secondary | ICD-10-CM | POA: Insufficient documentation

## 2023-08-14 DIAGNOSIS — Z79899 Other long term (current) drug therapy: Secondary | ICD-10-CM | POA: Insufficient documentation

## 2023-08-14 DIAGNOSIS — I739 Peripheral vascular disease, unspecified: Secondary | ICD-10-CM | POA: Diagnosis not present

## 2023-08-14 DIAGNOSIS — T82857A Stenosis of cardiac prosthetic devices, implants and grafts, initial encounter: Principal | ICD-10-CM | POA: Insufficient documentation

## 2023-08-14 DIAGNOSIS — Z7982 Long term (current) use of aspirin: Secondary | ICD-10-CM | POA: Insufficient documentation

## 2023-08-14 DIAGNOSIS — I70221 Atherosclerosis of native arteries of extremities with rest pain, right leg: Secondary | ICD-10-CM | POA: Diagnosis not present

## 2023-08-14 DIAGNOSIS — Z87891 Personal history of nicotine dependence: Secondary | ICD-10-CM | POA: Diagnosis not present

## 2023-08-14 DIAGNOSIS — I10 Essential (primary) hypertension: Secondary | ICD-10-CM | POA: Diagnosis not present

## 2023-08-14 HISTORY — PX: LOWER EXTREMITY INTERVENTION: CATH118252

## 2023-08-14 HISTORY — PX: LOWER EXTREMITY ANGIOGRAPHY: CATH118251

## 2023-08-14 HISTORY — PX: ABDOMINAL AORTOGRAM: CATH118222

## 2023-08-14 LAB — POCT I-STAT, CHEM 8
BUN: 21 mg/dL — ABNORMAL HIGH (ref 6–20)
Calcium, Ion: 1.29 mmol/L (ref 1.15–1.40)
Chloride: 106 mmol/L (ref 98–111)
Creatinine, Ser: 1.1 mg/dL (ref 0.61–1.24)
Glucose, Bld: 100 mg/dL — ABNORMAL HIGH (ref 70–99)
HCT: 47 % (ref 39.0–52.0)
Hemoglobin: 16 g/dL (ref 13.0–17.0)
Potassium: 4 mmol/L (ref 3.5–5.1)
Sodium: 140 mmol/L (ref 135–145)
TCO2: 21 mmol/L — ABNORMAL LOW (ref 22–32)

## 2023-08-14 LAB — POCT ACTIVATED CLOTTING TIME: Activated Clotting Time: 273 s

## 2023-08-14 LAB — GLUCOSE, CAPILLARY
Glucose-Capillary: 86 mg/dL (ref 70–99)
Glucose-Capillary: 223 mg/dL — ABNORMAL HIGH (ref 70–99)

## 2023-08-14 SURGERY — ABDOMINAL AORTOGRAM
Anesthesia: LOCAL

## 2023-08-14 MED ORDER — ASPIRIN 81 MG PO TBEC
81.0000 mg | DELAYED_RELEASE_TABLET | Freq: Every day | ORAL | Status: DC
  Administered 2023-08-15: 81 mg via ORAL
  Filled 2023-08-14: qty 1

## 2023-08-14 MED ORDER — CLOPIDOGREL BISULFATE 75 MG PO TABS
75.0000 mg | ORAL_TABLET | Freq: Every day | ORAL | Status: DC
  Administered 2023-08-15: 75 mg via ORAL
  Filled 2023-08-14: qty 1

## 2023-08-14 MED ORDER — ATORVASTATIN CALCIUM 10 MG PO TABS
20.0000 mg | ORAL_TABLET | Freq: Every day | ORAL | Status: DC
  Administered 2023-08-15: 20 mg via ORAL
  Filled 2023-08-14: qty 2

## 2023-08-14 MED ORDER — SODIUM CHLORIDE 0.9% FLUSH: 3.0000 mL | Freq: Two times a day (BID) | INTRAVENOUS | Status: DC

## 2023-08-14 MED ORDER — ASPIRIN 81 MG PO CHEW
CHEWABLE_TABLET | ORAL | Status: DC | PRN
  Administered 2023-08-14: 81 mg via ORAL

## 2023-08-14 MED ORDER — SODIUM CHLORIDE 0.9 % IV SOLN
INTRAVENOUS | Status: DC | PRN
  Administered 2023-08-14: 1.75 mg/kg/h via INTRAVENOUS

## 2023-08-14 MED ORDER — FENTANYL CITRATE (PF) 100 MCG/2ML IJ SOLN
INTRAMUSCULAR | Status: AC
  Filled 2023-08-14: qty 2

## 2023-08-14 MED ORDER — MIDAZOLAM HCL 2 MG/2ML IJ SOLN
INTRAMUSCULAR | Status: DC | PRN
  Administered 2023-08-14: 1 mg via INTRAVENOUS

## 2023-08-14 MED ORDER — CLOPIDOGREL BISULFATE 75 MG PO TABS
ORAL_TABLET | ORAL | Status: AC
  Filled 2023-08-14: qty 1

## 2023-08-14 MED ORDER — CLOPIDOGREL BISULFATE 300 MG PO TABS
ORAL_TABLET | ORAL | Status: DC | PRN
  Administered 2023-08-14: 300 mg via ORAL

## 2023-08-14 MED ORDER — SODIUM CHLORIDE 0.9 % IV SOLN: INTRAVENOUS | Status: DC

## 2023-08-14 MED ORDER — SODIUM CHLORIDE 0.9 % IV SOLN: 250.0000 mL | INTRAVENOUS | Status: DC | PRN

## 2023-08-14 MED ORDER — ONDANSETRON HCL 4 MG/2ML IJ SOLN: 4.0000 mg | Freq: Four times a day (QID) | INTRAMUSCULAR | Status: DC | PRN

## 2023-08-14 MED ORDER — ACETAMINOPHEN 325 MG PO TABS: 650.0000 mg | ORAL_TABLET | ORAL | Status: DC | PRN

## 2023-08-14 MED ORDER — INSULIN ASPART 100 UNIT/ML IJ SOLN
0.0000 [IU] | Freq: Three times a day (TID) | INTRAMUSCULAR | Status: DC
Start: 2023-08-15 — End: 2023-08-15
  Administered 2023-08-15: 2 [IU] via SUBCUTANEOUS

## 2023-08-14 MED ORDER — HYDRALAZINE HCL 20 MG/ML IJ SOLN: 5.0000 mg | INTRAMUSCULAR | Status: DC | PRN

## 2023-08-14 MED ORDER — LABETALOL HCL 5 MG/ML IV SOLN: 10.0000 mg | INTRAVENOUS | Status: DC | PRN

## 2023-08-14 MED ORDER — ASPIRIN 81 MG PO CHEW
CHEWABLE_TABLET | ORAL | Status: AC
  Filled 2023-08-14: qty 1

## 2023-08-14 MED ORDER — INSULIN ASPART 100 UNIT/ML IJ SOLN
0.0000 [IU] | Freq: Every day | INTRAMUSCULAR | Status: DC
  Administered 2023-08-14: 2 [IU] via SUBCUTANEOUS

## 2023-08-14 MED ORDER — LIDOCAINE HCL (PF) 1 % IJ SOLN
INTRAMUSCULAR | Status: DC | PRN
  Administered 2023-08-14: 15 mL

## 2023-08-14 MED ORDER — SODIUM CHLORIDE 0.9 % IV SOLN: INTRAVENOUS | Status: AC

## 2023-08-14 MED ORDER — BIVALIRUDIN BOLUS VIA INFUSION - CUPID
INTRAVENOUS | Status: DC | PRN
  Administered 2023-08-14: 72.15 mg via INTRAVENOUS

## 2023-08-14 MED ORDER — IODIXANOL 320 MG/ML IV SOLN
INTRAVENOUS | Status: DC | PRN
  Administered 2023-08-14: 75 mL via INTRA_ARTERIAL

## 2023-08-14 MED ORDER — CLOPIDOGREL BISULFATE 75 MG PO TABS: 75.0000 mg | ORAL_TABLET | Freq: Every day | ORAL | Status: DC

## 2023-08-14 MED ORDER — SODIUM CHLORIDE 0.9% FLUSH
3.0000 mL | Freq: Two times a day (BID) | INTRAVENOUS | Status: DC
  Administered 2023-08-14 (×2): 3 mL via INTRAVENOUS

## 2023-08-14 MED ORDER — FENTANYL CITRATE (PF) 100 MCG/2ML IJ SOLN
INTRAMUSCULAR | Status: DC | PRN
  Administered 2023-08-14: 25 ug via INTRAVENOUS

## 2023-08-14 MED ORDER — LIDOCAINE HCL (PF) 1 % IJ SOLN
INTRAMUSCULAR | Status: AC
  Filled 2023-08-14: qty 30

## 2023-08-14 MED ORDER — SODIUM CHLORIDE 0.9 % IV SOLN
INTRAVENOUS | Status: AC | PRN
  Administered 2023-08-14: 10 mL/h via INTRAVENOUS

## 2023-08-14 MED ORDER — ASPIRIN 81 MG PO TBEC: 81.0000 mg | DELAYED_RELEASE_TABLET | Freq: Every day | ORAL | Status: DC

## 2023-08-14 MED ORDER — SODIUM CHLORIDE 0.9% FLUSH: 3.0000 mL | INTRAVENOUS | Status: DC | PRN

## 2023-08-14 MED ORDER — MIDAZOLAM HCL 2 MG/2ML IJ SOLN
INTRAMUSCULAR | Status: AC
  Filled 2023-08-14: qty 2

## 2023-08-14 SURGICAL SUPPLY — 18 items
CATH OMNI FLUSH 5F 65CM (CATHETERS) IMPLANT
COVER DOME SNAP 22 D (MISCELLANEOUS) IMPLANT
DCB IN.PACT 5X40 (BALLOONS) IMPLANT
DEVICE CLOSURE MYNXGRIP 6/7F (Vascular Products) IMPLANT
GLIDEWIRE ADV .035X260CM (WIRE) IMPLANT
KIT ENCORE 26 ADVANTAGE (KITS) IMPLANT
KIT MICROPUNCTURE NIT STIFF (SHEATH) IMPLANT
KIT PV (KITS) ×1 IMPLANT
KIT SYRINGE INJ CVI SPIKEX1 (MISCELLANEOUS) IMPLANT
SET ATX-X65L (MISCELLANEOUS) IMPLANT
SHEATH CATAPULT 6FR 45 (SHEATH) IMPLANT
SHEATH PINNACLE 5F 10CM (SHEATH) IMPLANT
SHEATH PINNACLE 6F 10CM (SHEATH) IMPLANT
SHEATH PROBE COVER 6X72 (BAG) IMPLANT
SYR MEDRAD MARK 7 150ML (SYRINGE) ×1 IMPLANT
TRANSDUCER W/STOPCOCK (MISCELLANEOUS) ×1 IMPLANT
TRAY PV CATH (CUSTOM PROCEDURE TRAY) ×1 IMPLANT
WIRE BENTSON .035X145CM (WIRE) IMPLANT

## 2023-08-14 NOTE — Progress Notes (Signed)
 Nurse for [phone]  Amber called report and client transferred via wheelchair to 4E19; client up and walked and tolerated well; left groin stable, no bleeding or hematoma

## 2023-08-14 NOTE — H&P (Signed)
 History and Physical Interval Note:  08/14/2023 7:34 AM  Thomas [name]  has presented today for surgery, with the diagnosis of Critical Limb Ischemia-Right Extremity.  The various methods of treatment have been discussed with the patient and family. After consideration of risks, benefits and other options for treatment, the patient has consented to  Procedure(s): ABDOMINAL AORTOGRAM (N/A) Lower Extremity Angiography (N/A) LOWER EXTREMITY INTERVENTION (N/A) as a surgical intervention.  The patient's history has been reviewed, patient examined, no change in status, stable for surgery.  I have reviewed the patient's chart and labs.  Questions were answered to the patient's satisfaction.     Thomas [name]     Patient name: Thomas [name]      MRN: [id number]        DOB: 08-Jun-1975          Sex: male   REASON FOR VISIT: 80-month follow-up, lower extremity surveillance for PAD   HPI: Thomas [name] is a 48 y.o. male with history of diabetes, hypertension, hyperlipidemia, peripheral vascular disease that presents for 55-month interval follow-up for his PAD.  Patient is well-known to us  and previously underwent right external iliac stent on 04/19/2019 followed by a right above-knee popliteal to TP trunk bypass with vein on 04/22/2019 for tissue loss.  At a later time in 2023 he required right SFA stenting as well as right TP trunk and PT angioplasty for a threatened bypass.  He is also had left SFA and BK popliteal as well as PT angioplasty 2022 for claudication.   Today he denies any lower extremity complaints.  No active tissue loss.  States he is not smoking.  Taking aspirin  Plavix .  I did recommend short interval follow-up given concern for stenosis on prior imaging.       Past Medical History:  Diagnosis Date   Depression 01/27/2015   Diabetes mellitus without complication (HCC)     Hyperlipidemia     Hypertension     Peripheral arterial disease (HCC)     Peripheral vascular disease  (HCC)                 Past Surgical History:  Procedure Laterality Date   ABDOMINAL AORTOGRAM W/LOWER EXTREMITY Right 04/19/2019    Procedure: ABDOMINAL AORTOGRAM W/LOWER EXTREMITY;  Surgeon: Thomas Hensen, MD;  Location: [facility];  Service: Cardiovascular;  Laterality: Right;   ABDOMINAL AORTOGRAM W/LOWER EXTREMITY Right 09/16/2019    Procedure: ABDOMINAL AORTOGRAM W/LOWER EXTREMITY;  Surgeon: Thomas Hensen, MD;  Location: [facility];  Service: Cardiovascular;  Laterality: Right;   ABDOMINAL AORTOGRAM W/LOWER EXTREMITY N/A 08/17/2020    Procedure: ABDOMINAL AORTOGRAM W/LOWER EXTREMITY;  Surgeon: Thomas Hensen, MD;  Location: [facility];  Service: Cardiovascular;  Laterality: N/A;   ABDOMINAL AORTOGRAM W/LOWER EXTREMITY N/A 12/27/2021    Procedure: ABDOMINAL AORTOGRAM W/LOWER EXTREMITY;  Surgeon: Thomas Hensen, MD;  Location: [facility];  Service: Cardiovascular;  Laterality: N/A;   FEMORAL-TIBIAL BYPASS GRAFT Right 04/22/2019    Procedure: Right  FEMORAL-TIBIAL ARTERY BYPASS GRAFT with Harvest of Saphenous vein RIGHT SUPERFICIAL FEMORAL ARTERY TO TIBIOPERONEAL TRUNK BYPASS;  Surgeon: Thomas Hensen, MD;  Location: [facility] OR;  Service: Vascular;  Laterality: Right;   IR CERVICAL/THORACIC DISC ASPIRATION W/IMAG GUIDE   04/15/2019   IR FLUORO GUIDED NEEDLE PLC ASPIRATION/INJECTION LOC   04/15/2019   kidney stones   2009   PERIPHERAL VASCULAR BALLOON ANGIOPLASTY Right 09/16/2019    Procedure: PERIPHERAL VASCULAR BALLOON  ANGIOPLASTY;  Surgeon: Thomas Hensen, MD;  Location: [facility] INVASIVE CV LAB;  Service: Cardiovascular;  Laterality: Right;  ext iliac DCB   PERIPHERAL VASCULAR BALLOON ANGIOPLASTY   08/17/2020    Procedure: PERIPHERAL VASCULAR BALLOON ANGIOPLASTY;  Surgeon: Thomas Hensen, MD;  Location: [facility];  Service: Cardiovascular;;  Right TP trunk   PERIPHERAL VASCULAR BALLOON ANGIOPLASTY Left 08/24/2020    Procedure:  PERIPHERAL VASCULAR BALLOON ANGIOPLASTY;  Surgeon: Thomas Hensen, MD;  Location: [facility];  Service: Cardiovascular;  Laterality: Left;  Tibioperoneal trunk and superficial femoral arteries.   PERIPHERAL VASCULAR BALLOON ANGIOPLASTY   12/27/2021    Procedure: PERIPHERAL VASCULAR BALLOON ANGIOPLASTY;  Surgeon: Thomas Hensen, MD;  Location: [facility];  Service: Cardiovascular;;   PERIPHERAL VASCULAR INTERVENTION   04/19/2019    Procedure: PERIPHERAL VASCULAR INTERVENTION;  Surgeon: Thomas Hensen, MD;  Location: [facility];  Service: Cardiovascular;;  right external iliac   PERIPHERAL VASCULAR INTERVENTION   12/27/2021    Procedure: PERIPHERAL VASCULAR INTERVENTION;  Surgeon: Thomas Hensen, MD;  Location: [facility];  Service: Cardiovascular;;   TEE WITHOUT CARDIOVERSION N/A 04/19/2019    Procedure: TRANSESOPHAGEAL ECHOCARDIOGRAM (TEE);  Surgeon: Wendie Hamburg, MD;  Location: [facility] ENDOSCOPY;  Service: Endoscopy;  Laterality: N/A;               Family History  Problem Relation Age of Onset   Colon cancer Neg Hx     Esophageal cancer Neg Hx     Pancreatic cancer Neg Hx     Liver cancer Neg Hx     Stomach cancer Neg Hx            SOCIAL HISTORY: Social History         Tobacco Use   Smoking status: Former      Current packs/day: 0.00      Types: Cigarettes      Quit date: 01/13/2022      Years since quitting: 1.5      Passive exposure: Never   Smokeless tobacco: Former   Tobacco comments:      2 cigarrettes a day  Substance Use Topics   Alcohol use: No      Alcohol/week: 0.0 standard drinks of alcohol      Allergies  No Known Allergies           Current Outpatient Medications  Medication Sig Dispense Refill   lisinopril  (ZESTRIL ) 20 MG tablet Take 1 tablet (20 mg total) by mouth daily. 90 tablet 3   omeprazole  (PRILOSEC) 20 MG capsule TAKE 1 CAPSULE BY MOUTH DAILY 90 capsule 0   acetaminophen  (TYLENOL ) 500  MG tablet Take 500 mg by mouth 2 (two) times daily.       aspirin  EC 81 MG tablet Take 81 mg by mouth daily. Swallow whole.       atorvastatin  (LIPITOR) 20 MG tablet Take 1 tablet (20 mg total) by mouth daily. 30 tablet 0   Bacillus Coagulans-Inulin (PROBIOTIC) 1-250 BILLION-MG CAPS Take by mouth.       clopidogrel  (PLAVIX ) 75 MG tablet Take 1 tablet (75 mg total) by mouth daily with breakfast. 90 tablet 2   Continuous Glucose Sensor (DEXCOM G7 SENSOR) MISC 1 Device by Does not apply route as directed. 9 each 3   cyanocobalamin (VITAMIN B12) 1000 MCG tablet Take 1,000 mcg by mouth daily.       doxycycline  (VIBRA -TABS) 100 MG tablet Take  1 tablet (100 mg total) by mouth 2 (two) times daily. 20 tablet 0   doxycycline  (VIBRA -TABS) 100 MG tablet Take 1 tablet (100 mg total) by mouth 2 (two) times daily. (Patient not taking: Reported on 07/22/2023) 20 tablet 0   empagliflozin  (JARDIANCE ) 25 MG TABS tablet Take 1 tablet (25 mg total) by mouth daily before breakfast. 90 tablet 3   Heating Pads PADS Use over the counter heating pads as needed for back pain (Patient not taking: Reported on 07/22/2023) 1 Units 5   insulin  glargine (LANTUS  SOLOSTAR) 100 UNIT/ML Solostar Pen Inject 32 Units into the skin daily. 30 mL 3   insulin  lispro (HUMALOG  KWIKPEN) 100 UNIT/ML KwikPen Inject 7 Units into the skin 3 (three) times daily. 30 mL 3   Insulin  Pen Needle 32G X 4 MM MISC 1 Device by Does not apply route in the morning, at noon, in the evening, and at bedtime. 400 each 3   silver  sulfADIAZINE  (SILVADENE ) 1 % cream Apply 1 Application topically daily. 50 g 0   tadalafil  (CIALIS ) 5 MG tablet Take 1 tablet (5 mg total) by mouth daily as needed for erectile dysfunction. 30 tablet 0      No current facility-administered medications for this visit.        REVIEW OF SYSTEMS:  [X]  denotes positive finding, [ ]  denotes negative finding Cardiac   Comments:  Chest pain or chest pressure:      Shortness of breath upon  exertion:      Short of breath when lying flat:      Irregular heart rhythm:             Vascular      Pain in calf, thigh, or hip brought on by ambulation:      Pain in feet at night that wakes you up from your sleep:       Blood clot in your veins:      Leg swelling:              Pulmonary      Oxygen at home:      Productive cough:       Wheezing:              Neurologic      Sudden weakness in arms or legs:       Sudden numbness in arms or legs:       Sudden onset of difficulty speaking or slurred speech:      Temporary loss of vision in one eye:       Problems with dizziness:              Gastrointestinal      Blood in stool:       Vomited blood:              Genitourinary      Burning when urinating:       Blood in urine:             Psychiatric      Major depression:              Hematologic      Bleeding problems:      Problems with blood clotting too easily:             Skin      Rashes or ulcers:             Constitutional      Fever or chills:  PHYSICAL EXAM:    Vitals:    07/22/23 1130  BP: (!) 143/86  Pulse: 89  Resp: 18  Temp: 98 F (36.7 C)  TempSrc: Temporal  Weight: 219 lb 1.6 oz (99.4 kg)  Height: 5\' 8"  (1.727 m)      GENERAL: The patient is a well-nourished male, in no acute distress. The vital signs are documented above. CARDIAC: There is a regular rate and rhythm.  VASCULAR:  Bilateral femoral pulses palpable No palpable pedal pulses No tissue loss. PULMONARY: No respiratory distress. ABDOMEN: Soft and non-tender. MUSCULOSKELETAL: There are no major deformities or cyanosis. NEUROLOGIC: No focal weakness or paresthesias are detected. SKIN: There are no ulcers or rashes noted. PSYCHIATRIC: The patient has a normal affect.   DATA:    Lower extremity arterial duplex today does show high-grade stenosis of the right SFA stent proximal to the bypass with a velocity of 342; right external iliac stent patent without  stenosis; patent right leg bypass       ABIs today are 0.99 on the right biphasic and 0.64 on the left monophasic   Assessment/Plan:   48 y.o. male with history of diabetes, hypertension, hyperlipidemia, peripheral vascular disease that presents for 39-month interval follow-up.  Patient is well-known to us  and previously underwent right external iliac stent on 04/19/2019 followed by a right above-knee popliteal to TP trunk bypass with vein on 04/22/2019 for tissue loss.  At a later time in 2023 he required right SFA stenting as well as right TP trunk and PT angioplasty for a threatened bypass.  He is also had left SFA and BK popliteal as well as PT angioplasty 2022 for intermittent claudication.   On 22-month interval follow-up today he has a higher velocity with increasing stenosis in his right SFA stent with some sluggish flow in the bypass.  I have recommended returning to the Cath Lab for aortogram lower extremity arteriogram with a focus on the right leg.  Discussed this would likely be SFA angioplasty versus additional stent placement for in-stent stenosis.  Will get scheduled in the Cath Lab.  Risk-benefit discussed.  Asked that he stay on aspirin  Plavix  statin.     Thomas Hensen, MD [facility] [place] Office: [phone]

## 2023-08-14 NOTE — Op Note (Signed)
 Patient name: Thomas [name] MRN: [id number] DOB: 1975-06-19 Sex: male  08/14/2023 Pre-operative Diagnosis: High-grade stenosis right SFA stent with history of right leg bypass Post-operative diagnosis:  Same Surgeon:  [name], MD Procedure Performed: 1.  Ultrasound-guided access left common femoral artery 2.  Aortogram with catheter selection of aorta 3.  Right lower extremity arteriogram with catheter selection of the right external iliac artery 4.  Drug-coated balloon angioplasty of in-stent right SFA stenosis (5 mm x 40 mm drug-coated In*pact) 5.  Mynx closure of the left common femoral artery 6.  42 minutes of monitored moderate conscious sedation time  Indications: 48 year old male with extensive vascular history including previous right above-knee popliteal artery to TP trunk bypass in 2021 for tissue loss.  Subsequently has undergone right external iliac stenting and right SFA stenting.  Recent duplex and surveillance showed high-grade stenosis in his right SFA stent.  He presents for intervention after risk benefits discussed.  Findings:   Ultrasound guided access left common femoral artery.  Aortogram showed patent renal arteries with a patent infrarenal aorta.  The right iliac was widely patent including the external iliac stent.  On the right, the common femoral and profunda were widely patent.  The proximal SFA stent had a high-grade focal 80% stenosis.  The remainder of the SFA and popliteal artery was patent with a widely patent above-knee popliteal artery to TP trunk bypass.  Patient has two-vessel runoff in the peroneal and PT.  Dominant runoff is into the PT that is widely patent.  Ultimately the right SFA in-stent stenosis was treated with a 5 mm drug-coated In*pact.  Excellent results.  No significant residual stenosis.  Preserved runoff with patent bypass.   Procedure:  The patient was identified in the holding area and taken to room 8.  The patient was  then placed supine on the table and prepped and draped in the usual sterile fashion.  A time out was called.  The patient received Versed  and fentanyl  for conscious moderate sedation.  Vital signs were monitored including heart rate, respiratory rate, oxygenation and blood pressure.  I was present for all of sedation.  Ultrasound was used to evaluate the left common femoral artery.  It was patent .  A digital ultrasound image was acquired.  A micropuncture needle was used to access the left common femoral artery under ultrasound guidance.  An 018 wire was advanced without resistance and a micropuncture sheath was placed.  The 018 wire was removed and a benson wire was placed.  The micropuncture sheath was exchanged for a 5 french sheath.  An omniflush catheter was advanced over the wire to the level of L-1.  An abdominal angiogram was obtained.  Next, using the omniflush catheter and a benson wire, the aortic bifurcation was crossed and the catheter was placed into theright external iliac artery and right runoff was obtained.  Pertinent findings are noted above.  Ultimately we elected for intervention.  I used a Glidewire advantage down the right SFA and then upsized for 6 [place] catapult sheath in the left groin over the aortic bifurcation.  Patient was given Angiomax  given concern for pork derived products and he did not want heparin .  We did check an ACT to ensure it was therapeutic.  This was dosed according manufactures recommendations.  I then got a planning angiogram with a hand-injection.  The right SFA in-stent stenosis was treated with a 5 mm x 40 mm drug-coated Impact for 3 minutes.  Widely  patent stent at completion with preserved runoff.  Short 6 French sheath was placed in the left groin.  Mynx closure was deployed.  Plan: Continue aspirin  Plavix  statin.  Will arrange follow-up in 1 month with right leg arterial duplex and ABIs.  [name], MD [facility]  [place] Office: [phone]

## 2023-08-15 ENCOUNTER — Encounter (HOSPITAL_COMMUNITY): Payer: Self-pay | Admitting: Vascular Surgery

## 2023-08-15 DIAGNOSIS — I739 Peripheral vascular disease, unspecified: Secondary | ICD-10-CM | POA: Diagnosis not present

## 2023-08-15 DIAGNOSIS — T82857A Stenosis of cardiac prosthetic devices, implants and grafts, initial encounter: Secondary | ICD-10-CM | POA: Diagnosis not present

## 2023-08-15 DIAGNOSIS — I10 Essential (primary) hypertension: Secondary | ICD-10-CM | POA: Diagnosis not present

## 2023-08-15 DIAGNOSIS — Z7982 Long term (current) use of aspirin: Secondary | ICD-10-CM | POA: Diagnosis not present

## 2023-08-15 DIAGNOSIS — Z87891 Personal history of nicotine dependence: Secondary | ICD-10-CM | POA: Diagnosis not present

## 2023-08-15 DIAGNOSIS — Z79899 Other long term (current) drug therapy: Secondary | ICD-10-CM | POA: Diagnosis not present

## 2023-08-15 DIAGNOSIS — Z794 Long term (current) use of insulin: Secondary | ICD-10-CM | POA: Diagnosis not present

## 2023-08-15 DIAGNOSIS — E119 Type 2 diabetes mellitus without complications: Secondary | ICD-10-CM | POA: Diagnosis not present

## 2023-08-15 DIAGNOSIS — Z7902 Long term (current) use of antithrombotics/antiplatelets: Secondary | ICD-10-CM | POA: Diagnosis not present

## 2023-08-15 LAB — GLUCOSE, CAPILLARY: Glucose-Capillary: 132 mg/dL — ABNORMAL HIGH (ref 70–99)

## 2023-08-15 NOTE — Progress Notes (Signed)
 Patient has multiple events of vtach from a 5 beat run to a sustained run of vtach for a whole minute. It happens every time patient is out of bed and moving in the room. Notified MD.

## 2023-08-15 NOTE — Progress Notes (Signed)
 Telemetry called to inform this nurse that patient is sustaining Vtach with rate of 140 bpm, immediately went to patient's room and patient is ambulating in room stating he just came back from the bathroom, patient instructed to lay back on bed and rest, heart rate went down to 91 bpm while resting. Patient states he doesn't feel any discomfort or pain and requests for coffee. Patient advised to have coffee later as heart rate has just recently gone up.  Dr. Delon Ferris updated.

## 2023-08-15 NOTE — Plan of Care (Signed)
  Problem: Cardiovascular: Goal: Ability to achieve and maintain adequate cardiovascular perfusion will improve Outcome: Not Progressing Goal: Vascular access site(s) Level 0-1 will be maintained Outcome: Not Progressing   Problem: Pain Managment: Goal: General experience of comfort will improve and/or be controlled Outcome: Not Progressing

## 2023-08-15 NOTE — Progress Notes (Addendum)
  Progress Note    08/15/2023 8:03 AM 1 Day Post-Op  Subjective:  no complaints, ready to go home    Vitals:   08/14/23 2310 08/15/23 0325  BP: (!) 144/91 (!) 147/95  Pulse: 84 89  Resp: 16 17  Temp: (!) 97.5 F (36.4 C) 97.6 F (36.4 C)  SpO2: 99% 98%    Physical Exam: General:  resting comfortably, A&O x3 Lungs:  nonlabored Incisions:  L groin cath site c/d/l without hematoma Extremities:  RLE well perfused with brisk DP/PT doppler signals   CBC    Component Value Date/Time   WBC 6.4 12/24/2022 0856   RBC 5.32 12/24/2022 0856   HGB 16.0 08/14/2023 0551   HCT 47.0 08/14/2023 0551   PLT 257.0 12/24/2022 0856   MCV 81.5 12/24/2022 0856   MCH 28.7 01/28/2020 1116   MCHC 33.2 12/24/2022 0856   RDW 13.9 12/24/2022 0856   LYMPHSABS 2.1 12/24/2022 0856   MONOABS 0.5 12/24/2022 0856   EOSABS 0.1 12/24/2022 0856   BASOSABS 0.0 12/24/2022 0856    BMET    Component Value Date/Time   NA 140 08/14/2023 0551   K 4.0 08/14/2023 0551   CL 106 08/14/2023 0551   CO2 26 12/24/2022 0856   GLUCOSE 100 (H) 08/14/2023 0551   BUN 21 (H) 08/14/2023 0551   CREATININE 1.10 08/14/2023 0551   CREATININE 1.08 09/14/2019 1551   CALCIUM  9.7 12/24/2022 0856   GFRNONAA >60 05/09/2019 1039   GFRNONAA >89 11/14/2016 1352   GFRAA >60 05/09/2019 1039   GFRAA >89 11/14/2016 1352    INR    Component Value Date/Time   INR 1.1 05/09/2019 1039     Intake/Output Summary (Last 24 hours) at 08/15/2023 0803 Last data filed at 08/14/2023 1500 Gross per 24 hour  Intake 76.78 ml  Output --  Net 76.78 ml      Assessment/Plan:  48 y.o. male is 1 day post op, s/p: RLE angio with DCBA of right SFA in-stent stenosis  -He is doing well this morning. He denies any pain -L groin cath site is intact and dry without any bleeding or hematoma -RLE warm and well perfused with brisk DP/PT doppler signals -He is tolerating a normal diet and ambulating without issue. He has also gone to the bathroom  without issue -Will d/c home today with follow up with our office in 4-6 wks. Continue asa, plavix , and statin   McKenzi Schuh, PA-C [facility] [phone] 08/15/2023 8:03 AM  I have seen and evaluated the patient. I agree with the PA note as documented above.  Admitted for social reasons as he had nobody to stay with him after moderate sedation.  He has no complaints today.  Wants to go home.  Underwent right SFA DCB for high-grade stenosis in setting of right leg bypass.  Left groin is doing fine with no hematoma.  Brisk right DP PT signals.  Aspirin  Plavix  statin.  Arrange follow-up in 1 month with right leg arterial duplex and ABIs.  [name], MD [facility] [place] Office: [phone]

## 2023-08-15 NOTE — Progress Notes (Signed)
 Informed Dr. Delon Ferris that patient had run of Bigeminy and Trigeminy but did not sustain and went back to Sinus Rhythm.patient asymptomatic and has no complaint at the moment.

## 2023-08-15 NOTE — Discharge Instructions (Signed)
 Remove the dressing on your left groin on 5/2.  You may shower when you go home.   Resume all your medications as prescribed.  Do not perform any heavy lifting > 15 pounds for 4 weeks after your procedure.

## 2023-08-20 NOTE — Discharge Summary (Signed)
 Discharge Summary  Patient ID: Thomas [name] [id number] 47 y.o. 09/06/1975  Admit date: 08/14/2023  Discharge date and time: 08/15/2023  9:12 AM   Admitting Physician: [name], MD   Discharge Physician: [name], MD   Admission Diagnoses: PAD (peripheral artery disease) [facility]) [I73.9]  Discharge Diagnoses: PAD (peripheral artery disease) (HCC) I73.9  Admission Condition: fair  Discharged Condition: good  Indication for Admission: 48 year old male with extensive vascular history including previous right above-knee popliteal artery to TP trunk bypass in 2021 for tissue loss.  Subsequently has undergone right external iliac stenting and right SFA stenting.  Recent duplex and surveillance showed high-grade stenosis in his right SFA stent.  He presents for intervention after risk benefits discussed.   Hospital Course: The patient presented to [facility] on 08/14/2023 and underwent right lower extremity angiogram with drug-coated balloon angioplasty of in-stent right SFA stenosis by Dr. Fulton Job.  He tolerated the procedure well and was transferred to the PACU in stable condition.  He was admitted overnight for observation.   On POD 1, he was doing well without any issues.  His left groin access site was intact without bleeding or hematoma.  His right lower extremity was warm and well-perfused.  He was tolerating a normal diet, voiding without difficulty, and ambulating without difficulty.  He was discharged home on POD 1.  Consults: None  Treatments: IV hydration and analgesia: fentanyl   Discharge Exam: See progress note  Vitals:   08/15/23 0325 08/15/23 0822  BP: (!) 147/95 123/77  Pulse: 89 92  Resp: 17 18  Temp: 97.6 F (36.4 C) 98.1 F (36.7 C)  SpO2: 98% 97%     Disposition: Discharge disposition: 01-Home or Self Care       Patient Instructions:  Allergies as of 08/15/2023       Reactions   Pork-derived Products Other (See  Comments)   Does not eat        Medication List     TAKE these medications    acetaminophen  500 MG tablet Commonly known as: TYLENOL  Take 500 mg by mouth every 8 (eight) hours as needed for mild pain (pain score 1-3) or moderate pain (pain score 4-6).   aspirin  EC 81 MG tablet Take 81 mg by mouth daily. Swallow whole.   atorvastatin  20 MG tablet Commonly known as: LIPITOR Take 1 tablet by mouth once daily   clopidogrel  75 MG tablet Commonly known as: PLAVIX  Take 1 tablet (75 mg total) by mouth daily with breakfast.   [employer] MAGNESIUM  PO Take 1 capsule by mouth daily at 12 noon. Magnesium  citrate 250   cyanocobalamin 1000 MCG tablet Commonly known as: VITAMIN B12 Take 1,000 mcg by mouth daily.   Dexcom G7 Sensor Misc 1 Device by Does not apply route as directed. What changed:  how to take this when to take this additional instructions   empagliflozin  25 MG Tabs tablet Commonly known as: Jardiance  Take 1 tablet (25 mg total) by mouth daily before breakfast.   insulin  lispro 100 UNIT/ML KwikPen Commonly known as: HumaLOG  KwikPen Inject 7 Units into the skin 3 (three) times daily. What changed: how much to take   Lantus  SoloStar 100 UNIT/ML Solostar Pen Generic drug: insulin  glargine Inject 32 Units into the skin daily.   lisinopril  20 MG tablet Commonly known as: ZESTRIL  Take 1 tablet (20 mg total) by mouth daily.   methimazole  5 MG tablet Commonly known as: TAPAZOLE  Take 1 tablet (5 mg total) by mouth  daily.   omeprazole  20 MG capsule Commonly known as: PRILOSEC TAKE 1 CAPSULE BY MOUTH DAILY       Activity: activity as tolerated, no driving for 2 weeks, and no driving while on analgesics Diet: regular diet Wound Care: keep wound clean and dry  Follow-up with [facility] in 4 weeks.  SignedDeneise Finlay, PA-C 08/20/2023 10:55 PM [facility] Office: [phone]

## 2023-09-03 DIAGNOSIS — J3489 Other specified disorders of nose and nasal sinuses: Secondary | ICD-10-CM | POA: Diagnosis not present

## 2023-09-03 DIAGNOSIS — H532 Diplopia: Secondary | ICD-10-CM | POA: Diagnosis not present

## 2023-09-03 DIAGNOSIS — G9340 Encephalopathy, unspecified: Secondary | ICD-10-CM | POA: Diagnosis not present

## 2023-09-05 ENCOUNTER — Other Ambulatory Visit: Payer: Self-pay | Admitting: *Deleted

## 2023-09-05 DIAGNOSIS — I70221 Atherosclerosis of native arteries of extremities with rest pain, right leg: Secondary | ICD-10-CM

## 2023-09-06 ENCOUNTER — Other Ambulatory Visit: Payer: Self-pay | Admitting: Medical

## 2023-09-09 DIAGNOSIS — E079 Disorder of thyroid, unspecified: Secondary | ICD-10-CM | POA: Diagnosis not present

## 2023-09-09 DIAGNOSIS — H5789 Other specified disorders of eye and adnexa: Secondary | ICD-10-CM | POA: Diagnosis not present

## 2023-09-09 LAB — HM DIABETES EYE EXAM

## 2023-09-17 ENCOUNTER — Other Ambulatory Visit

## 2023-09-17 ENCOUNTER — Encounter: Payer: Self-pay | Admitting: Internal Medicine

## 2023-09-17 DIAGNOSIS — E0590 Thyrotoxicosis, unspecified without thyrotoxic crisis or storm: Secondary | ICD-10-CM | POA: Diagnosis not present

## 2023-09-19 ENCOUNTER — Ambulatory Visit: Payer: Self-pay | Admitting: Internal Medicine

## 2023-09-19 MED ORDER — METHIMAZOLE 5 MG PO TABS: 5.0000 mg | ORAL_TABLET | Freq: Two times a day (BID) | ORAL | 3 refills | Status: DC

## 2023-09-19 NOTE — Telephone Encounter (Signed)
 Please use an interpreter line and contact the patient to let him know that his thyroid continues to be overactive and to increase methimazole  from 1 tablet a day to 2 tablets daily    Thanks

## 2023-09-20 LAB — TRAB (TSH RECEPTOR BINDING ANTIBODY): TRAB: 4.42 IU/L — ABNORMAL HIGH (ref <=2.00)

## 2023-09-20 LAB — TSH: TSH: <0.01 m[IU]/L — ABNORMAL LOW (ref 0.40–4.50)

## 2023-09-20 LAB — T4, FREE: Free T4: 2.5 ng/dL — ABNORMAL HIGH (ref 0.8–1.8)

## 2023-09-23 ENCOUNTER — Ambulatory Visit: Attending: Vascular Surgery | Admitting: Physician Assistant

## 2023-09-23 ENCOUNTER — Ambulatory Visit (HOSPITAL_COMMUNITY)
Admission: RE | Admit: 2023-09-23 | Discharge: 2023-09-23 | Disposition: A | Discharge status: Home / Self Care | Source: Ambulatory Visit | Attending: Vascular Surgery | Admitting: Vascular Surgery

## 2023-09-23 ENCOUNTER — Ambulatory Visit (HOSPITAL_COMMUNITY): Admit: 2023-09-23 | Discharge: 2023-09-23 | Disposition: A | Discharge status: Home / Self Care

## 2023-09-23 VITALS — BP 156/101 | HR 87 | Temp 97.8°F | Resp 18 | Ht 68.0 in | Wt 220.8 lb

## 2023-09-23 DIAGNOSIS — I70221 Atherosclerosis of native arteries of extremities with rest pain, right leg: Secondary | ICD-10-CM

## 2023-09-23 DIAGNOSIS — I70213 Atherosclerosis of native arteries of extremities with intermittent claudication, bilateral legs: Secondary | ICD-10-CM | POA: Insufficient documentation

## 2023-09-23 DIAGNOSIS — I739 Peripheral vascular disease, unspecified: Secondary | ICD-10-CM | POA: Insufficient documentation

## 2023-09-23 LAB — VAS US ABI WITH/WO TBI
Left ABI: 0.68
Right ABI: 0.95

## 2023-09-23 NOTE — Progress Notes (Signed)
 Office Note   History of Present Illness   Thomas [name] is a 48 y.o. (20-Aug-1975) male who presents for post angio follow up.  He recently underwent right lower extremity angiogram with drug-coated balloon angioplasty of in-stent right SFA stenosis on 08/14/2023 by Dr. Fulton Job.  He returns today for follow-up.  He says that he is feeling great.  He denies any claudication, rest pain, or tissue loss in the right leg.  He says that his left leg sometimes tires out after an hour of walking.   Current Outpatient Medications  Medication Sig Dispense Refill   acetaminophen  (TYLENOL ) 500 MG tablet Take 500 mg by mouth every 8 (eight) hours as needed for mild pain (pain score 1-3) or moderate pain (pain score 4-6).     aspirin  EC 81 MG tablet Take 81 mg by mouth daily. Swallow whole.     atorvastatin  (LIPITOR) 20 MG tablet Take 1 tablet by mouth once daily 30 tablet 0   clopidogrel  (PLAVIX ) 75 MG tablet Take 1 tablet (75 mg total) by mouth daily with breakfast. 90 tablet 2   Continuous Glucose Sensor (DEXCOM G7 SENSOR) MISC 1 Device by Does not apply route as directed. (Patient taking differently: Inject 1 Device into the skin See admin instructions. Change every 10 days) 9 each 3   [employer] MAGNESIUM  PO Take 1 capsule by mouth daily at 12 noon. Magnesium  citrate 250     empagliflozin  (JARDIANCE ) 25 MG TABS tablet Take 1 tablet (25 mg total) by mouth daily before breakfast. 90 tablet 3   insulin  glargine (LANTUS  SOLOSTAR) 100 UNIT/ML Solostar Pen Inject 32 Units into the skin daily. 30 mL 3   insulin  lispro (HUMALOG  KWIKPEN) 100 UNIT/ML KwikPen Inject 7 Units into the skin 3 (three) times daily. (Patient taking differently: Inject 8 Units into the skin 3 (three) times daily.) 30 mL 3   lisinopril  (ZESTRIL ) 20 MG tablet Take 1 tablet (20 mg total) by mouth daily. 90 tablet 3   methimazole  (TAPAZOLE ) 5 MG tablet Take 1 tablet (5 mg total) by mouth 2 (two) times daily. 180 tablet 3   cyanocobalamin  (VITAMIN B12) 1000 MCG tablet Take 1,000 mcg by mouth daily. (Patient not taking: Reported on 09/23/2023)     omeprazole  (PRILOSEC) 20 MG capsule TAKE 1 CAPSULE BY MOUTH DAILY (Patient not taking: Reported on 08/14/2023) 90 capsule 0   No current facility-administered medications for this visit.    REVIEW OF SYSTEMS (negative unless checked):   Cardiac:  []  Chest pain or chest pressure? []  Shortness of breath upon activity? []  Shortness of breath when lying flat? []  Irregular heart rhythm?  Vascular:  []  Pain in calf, thigh, or hip brought on by walking? []  Pain in feet at night that wakes you up from your sleep? []  Blood clot in your veins? []  Leg swelling?  Pulmonary:  []  Oxygen at home? []  Productive cough? []  Wheezing?  Neurologic:  []  Sudden weakness in arms or legs? []  Sudden numbness in arms or legs? []  Sudden onset of difficult speaking or slurred speech? []  Temporary loss of vision in one eye? []  Problems with dizziness?  Gastrointestinal:  []  Blood in stool? []  Vomited blood?  Genitourinary:  []  Burning when urinating? []  Blood in urine?  Psychiatric:  []  Major depression  Hematologic:  []  Bleeding problems? []  Problems with blood clotting?  Dermatologic:  []  Rashes or ulcers?  Constitutional:  []  Fever or chills?  Ear/Nose/Throat:  []  Change in hearing? []  Nose  bleeds? []  Sore throat?  Musculoskeletal:  []  Back pain? []  Joint pain? []  Muscle pain?   Physical Examination    Vitals:   09/23/23 1446  BP: (!) 156/101  Pulse: 87  Resp: 18  Temp: 97.8 F (36.6 C)  TempSrc: Temporal  SpO2: 98%  Weight: 220 lb 12.8 oz (100.2 kg)  Height: 5' 8 (1.727 m)   Body mass index is 33.57 kg/m.  General:  WDWN in NAD; vital signs documented above Gait: Not observed HENT: WNL, normocephalic Pulmonary: normal non-labored breathing , without rales, rhonchi,  wheezing Cardiac: Regular Abdomen: soft, NT, no masses Skin: without  rashes Vascular Exam/Pulses: Brisk PT Doppler signals bilaterally Extremities: without ischemic changes, without gangrene , without cellulitis; without open wounds;  Musculoskeletal: no muscle wasting or atrophy  Neurologic: A&O X 3;  No focal weakness or paresthesias are detected Psychiatric:  The pt has Normal affect.  Non-Invasive Vascular imaging   ABI (09/23/2023) R:  ABI: 0.95 (0.99),  PT: tri DP: mono TBI:  0.48 L:  ABI: 0.68 (0.64),  PT: mono DP: mono TBI: 0.26  RLE Arterial Duplex (09/23/2023) Patent right SFA stent without flow-limiting stenosis.  Medical Decision Making   Thomas [name] is a 48 y.o. male who presents for post angio follow up  Based on the patient's vascular studies, his ABIs are essentially unchanged from his last visit.  His right ABI is 0.95 and left ABI is 0.68 Arterial duplex demonstrates a patent right SFA stent without flow-limiting stenosis.  There appears to be 50% stenosis of the midportion of the stent, which does not require intervention at this time He denies any rest pain or tissue loss of bilateral lower extremities.  He reports some fatigue of his left leg after an hour of walking, however this is not limiting to him His lower extremities are well-perfused with brisk PT Doppler signals He can follow up with our office in 3 months with repeat ABIs, right aortoiliac duplex, and bilateral lower extremity arterial duplex   Deneise Finlay PA-C [facility] Office: [phone]  Clinic MD: Marcelino Sera

## 2023-09-26 ENCOUNTER — Ambulatory Visit (INDEPENDENT_AMBULATORY_CARE_PROVIDER_SITE_OTHER)

## 2023-09-26 ENCOUNTER — Ambulatory Visit: Admitting: Podiatry

## 2023-09-26 DIAGNOSIS — M86671 Other chronic osteomyelitis, right ankle and foot: Secondary | ICD-10-CM | POA: Diagnosis not present

## 2023-09-26 DIAGNOSIS — E119 Type 2 diabetes mellitus without complications: Secondary | ICD-10-CM

## 2023-09-26 DIAGNOSIS — L02619 Cutaneous abscess of unspecified foot: Secondary | ICD-10-CM | POA: Diagnosis not present

## 2023-09-26 DIAGNOSIS — L03115 Cellulitis of right lower limb: Secondary | ICD-10-CM | POA: Diagnosis not present

## 2023-09-26 NOTE — Patient Instructions (Signed)

## 2023-09-27 NOTE — Progress Notes (Signed)
  Subjective:   Patient ID: Thomas [name], male   DOB: 48 y.o.   MRN: [id number]   HPI Chief Complaint  Patient presents with   Wound Check    RM#13 Right foot third toe patient doing well no signs of infection at this time.     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  States he has been doing well.  Denies any increase in swelling and no open lesions or any redness or warmth.  He has no pain.  He has no concerns today.  He does not report any fevers or chills.  Objective:  Physical Exam  General: AAO x3, NAD-translator present  Dermatological: Minimal callus to the distal aspect of the right second and third toe there is no underlying ulceration noted.  There is no significant edema but is no erythema or warmth.  No increased temperature.  There is no areas of fluctuance or crepitation.  No signs of infection noted today.  Vascular: Appropriately warm and well-perfused.  Neruologic: Sensation decreased  Musculoskeletal: Digital contracture.  No pain on exam.     Assessment:   Chronic osteomyelitis right foot     Plan:  -Treatment options discussed including all alternatives, risks, and complications -Etiology of symptoms were discussed -X-rays obtained reviewed.  There is stable cortical changes along the distal phalanx.  No evidence of acute fracture.  No soft tissue emphysema.  - At this time he is doing well and the toe has been stable for quite some time.  We discussed the risks of reoccurrence of the infection he is to monitor closely.  I did debride his occurs at the very minimal callus at the distal aspect of the toes without any complications or bleeding to make sure there is no underlying ulceration. -Glucose control, daily foot inspection discussed.  -Monitor for any clinical signs or symptoms of infection and directed to call the office immediately should any occur or go to the  ER.  Return in about 1 year (around 09/25/2024) for diabetic foot exam.  Charity Conch DPM

## 2023-10-09 ENCOUNTER — Other Ambulatory Visit: Payer: Self-pay | Admitting: Medical

## 2023-11-06 ENCOUNTER — Encounter: Payer: Self-pay | Admitting: Dietician

## 2023-11-06 ENCOUNTER — Telehealth: Payer: Self-pay

## 2023-11-06 ENCOUNTER — Encounter: Payer: Self-pay | Admitting: Internal Medicine

## 2023-11-06 ENCOUNTER — Other Ambulatory Visit (HOSPITAL_COMMUNITY): Payer: Self-pay

## 2023-11-06 ENCOUNTER — Encounter: Payer: Medicaid Other | Attending: Internal Medicine | Admitting: Dietician

## 2023-11-06 ENCOUNTER — Ambulatory Visit: Admitting: Internal Medicine

## 2023-11-06 VITALS — BP 122/78 | HR 90 | Wt 221.0 lb

## 2023-11-06 DIAGNOSIS — E0500 Thyrotoxicosis with diffuse goiter without thyrotoxic crisis or storm: Secondary | ICD-10-CM | POA: Diagnosis not present

## 2023-11-06 DIAGNOSIS — E1151 Type 2 diabetes mellitus with diabetic peripheral angiopathy without gangrene: Secondary | ICD-10-CM

## 2023-11-06 DIAGNOSIS — E1129 Type 2 diabetes mellitus with other diabetic kidney complication: Secondary | ICD-10-CM | POA: Diagnosis not present

## 2023-11-06 DIAGNOSIS — Z794 Long term (current) use of insulin: Secondary | ICD-10-CM | POA: Diagnosis not present

## 2023-11-06 DIAGNOSIS — E0590 Thyrotoxicosis, unspecified without thyrotoxic crisis or storm: Secondary | ICD-10-CM | POA: Diagnosis not present

## 2023-11-06 DIAGNOSIS — E1142 Type 2 diabetes mellitus with diabetic polyneuropathy: Secondary | ICD-10-CM | POA: Diagnosis not present

## 2023-11-06 DIAGNOSIS — E1165 Type 2 diabetes mellitus with hyperglycemia: Secondary | ICD-10-CM | POA: Diagnosis not present

## 2023-11-06 DIAGNOSIS — R809 Proteinuria, unspecified: Secondary | ICD-10-CM | POA: Diagnosis not present

## 2023-11-06 LAB — POCT GLYCOSYLATED HEMOGLOBIN (HGB A1C): Hemoglobin A1C: 7.1 % — AB (ref 4.0–5.6)

## 2023-11-06 MED ORDER — INSULIN LISPRO (1 UNIT DIAL) 100 UNIT/ML (KWIKPEN): PEN_INJECTOR | SUBCUTANEOUS | 3 refills | Status: AC

## 2023-11-06 MED ORDER — OMEPRAZOLE 40 MG PO CPDR: 40.0000 mg | DELAYED_RELEASE_CAPSULE | Freq: Every day | ORAL | 3 refills | Status: AC

## 2023-11-06 MED ORDER — OZEMPIC (0.25 OR 0.5 MG/DOSE) 2 MG/3ML ~~LOC~~ SOPN: 0.2500 mg | PEN_INJECTOR | SUBCUTANEOUS | 3 refills | Status: AC

## 2023-11-06 MED ORDER — DEXCOM G7 SENSOR MISC: 1.0000 | 3 refills | Status: DC

## 2023-11-06 NOTE — Telephone Encounter (Signed)
 Pharmacy Patient Advocate Encounter   Received notification from Pt Calls Messages that prior authorization for Dexcom G7 sensor is required/requested.   Insurance verification completed.   The patient is insured through [facility] .   Per test claim: PA required and submitted KEY/EOC/Request #: [id number] from 11/06/23 to 05/04/24 PA Case ID #: [id number]

## 2023-11-06 NOTE — Patient Instructions (Addendum)
 Keep up your good habits!             Continue to walk             Be mindful of your portion sizes  Half your plate should be vegetables  Aim to eat when you wake to see if this helps you avoid overnight snacks.  Find ways to cope with stress             Exercise             Pray             Enjoy nature

## 2023-11-06 NOTE — Telephone Encounter (Signed)
 [facility] pharmacy states that Dexcom needs PA.  Can we check this last PA not needed

## 2023-11-06 NOTE — Progress Notes (Unsigned)
 Name: Thomas [name]  Age/ Sex: 48 y.o., male   MRN/ DOB: [id number], 06-Jan-1976     PCP: Dorina Dallas RIGGERS   Reason for Endocrinology Evaluation: Type 2 Diabetes Mellitus  Initial Endocrine Consultative Visit: 09/15/2020    PATIENT IDENTIFIER: [name] is a 48 y.o. male with a past medical history of T2DM, HTN, and dyslipidemia. The patient has followed with Endocrinology clinic since 09/15/2020 for consultative assistance with management of his diabetes.  DIABETIC HISTORY:  [name] was diagnosed with DM in 2008 .  He was on metformin  in the past. His hemoglobin A1c has ranged from 6.7% in 2021, peaking at 15.8% in 2020  On his initial visit with me he had an A1c of 11.3%, he was only on basal insulin , he was having a lot of heartburn, and GI symptoms and we opted to start him on prandial insulin    Started jardiance  04/2021    THYROID  HISTORY: During evaluation for diplopia through ophthalmology 06/2023 patient was noted with hyperthyroidism, with suppressed TSH 0.05 u IU/mL and elevated free T4.  As well as elevated TSI  No family history of thyroid  disease   SUBJECTIVE:   During the last visit (08/06/2023): A1c 6.9%     Today (11/06/2023): [name] is here for follow-up on diabetes management.  He checks his blood sugars occasionally, he has not been using the Dexcom, patient states he discussed prohibitive .No recent hypoglycemia    I had referred him to GI due to severe GERD symptoms, they have contacted him multiple times to schedule an appointment without success    He is s/p right lower extremity angiogram with drug-coated balloon angioplasty of in-stent right SFA stenosis 08/14/2023  He continues to follow-up with ophthalmology for diplopia, MRI did not show significantly enlarged extra ocular muscles  He continues to follow up with podiatry (Dr. Alona) for right third toe chronic infection .  Weight has been slightly trending up. Continues  with heartburn issues, takes tums Denies palpitations  Denies constipation  or diarrhea  Patient states he has been very hungry lately, he has been eating a fourth meal in the middle of the night He does not have depression but does admit to stress  GLUCOSE METER: 14 day average 233 mg/dL  90 day average 849 mg/dL   899-765 mg/dL   HOME DIABETES REGIMEN:  Humalog  7 units with each meal  Lantus  32  units daily  Jardiance  25 mg daily     Statin: Yes ACE-I/ARB: Yes Prior Diabetic Education: Yes    DIABETIC COMPLICATIONS: Microvascular complications:   Denies: CKD, neuropathy, retinopathy Last Eye Exam: Completed 03/2021  Macrovascular complications:  PVD ( S/P right external iliac angioplasty and stent (04/19/2019) followed by bypass 08/2020, left angiogram 08/2022 Denies: CAD, CVA   HISTORY:  Past Medical History:  Past Medical History:  Diagnosis Date   Depression 01/27/2015   Diabetes mellitus without complication (HCC)    Hyperlipidemia    Hypertension    Peripheral arterial disease (HCC)    Peripheral vascular disease (HCC)    Past Surgical History:  Past Surgical History:  Procedure Laterality Date   ABDOMINAL AORTOGRAM N/A 08/14/2023   Procedure: ABDOMINAL AORTOGRAM;  Surgeon: Gretta Lonni PARAS, MD;  Location: [facility];  Service: Cardiovascular;  Laterality: N/A;   ABDOMINAL AORTOGRAM W/LOWER EXTREMITY Right 04/19/2019   Procedure: ABDOMINAL AORTOGRAM W/LOWER EXTREMITY;  Surgeon: Gretta Lonni PARAS, MD;  Location: [facility];  Service: Cardiovascular;  Laterality: Right;  ABDOMINAL AORTOGRAM W/LOWER EXTREMITY Right 09/16/2019   Procedure: ABDOMINAL AORTOGRAM W/LOWER EXTREMITY;  Surgeon: Gretta Lonni PARAS, MD;  Location: [facility] INVASIVE CV LAB;  Service: Cardiovascular;  Laterality: Right;   ABDOMINAL AORTOGRAM W/LOWER EXTREMITY N/A 08/17/2020   Procedure: ABDOMINAL AORTOGRAM W/LOWER EXTREMITY;  Surgeon: Gretta Lonni PARAS, MD;  Location: [facility];  Service: Cardiovascular;  Laterality: N/A;   ABDOMINAL AORTOGRAM W/LOWER EXTREMITY N/A 12/27/2021   Procedure: ABDOMINAL AORTOGRAM W/LOWER EXTREMITY;  Surgeon: Gretta Lonni PARAS, MD;  Location: [facility];  Service: Cardiovascular;  Laterality: N/A;   FEMORAL-TIBIAL BYPASS GRAFT Right 04/22/2019   Procedure: Right  FEMORAL-TIBIAL ARTERY BYPASS GRAFT with Harvest of Saphenous vein RIGHT SUPERFICIAL FEMORAL ARTERY TO TIBIOPERONEAL TRUNK BYPASS;  Surgeon: Gretta Lonni PARAS, MD;  Location: [facility] OR;  Service: Vascular;  Laterality: Right;   IR CERVICAL/THORACIC DISC ASPIRATION W/IMAG GUIDE  04/15/2019   IR FLUORO GUIDED NEEDLE PLC ASPIRATION/INJECTION LOC  04/15/2019   kidney stones  2009   LOWER EXTREMITY ANGIOGRAPHY N/A 08/14/2023   Procedure: Lower Extremity Angiography;  Surgeon: Gretta Lonni PARAS, MD;  Location: [facility];  Service: Cardiovascular;  Laterality: N/A;   LOWER EXTREMITY INTERVENTION N/A 08/14/2023   Procedure: LOWER EXTREMITY INTERVENTION;  Surgeon: Gretta Lonni PARAS, MD;  Location: [facility];  Service: Cardiovascular;  Laterality: N/A;   PERIPHERAL VASCULAR BALLOON ANGIOPLASTY Right 09/16/2019   Procedure: PERIPHERAL VASCULAR BALLOON ANGIOPLASTY;  Surgeon: Gretta Lonni PARAS, MD;  Location: [facility];  Service: Cardiovascular;  Laterality: Right;  ext iliac DCB   PERIPHERAL VASCULAR BALLOON ANGIOPLASTY  08/17/2020   Procedure: PERIPHERAL VASCULAR BALLOON ANGIOPLASTY;  Surgeon: Gretta Lonni PARAS, MD;  Location: [facility];  Service: Cardiovascular;;  Right TP trunk   PERIPHERAL VASCULAR BALLOON ANGIOPLASTY Left 08/24/2020   Procedure: PERIPHERAL VASCULAR BALLOON ANGIOPLASTY;  Surgeon: Gretta Lonni PARAS, MD;  Location: [facility];  Service: Cardiovascular;  Laterality: Left;  Tibioperoneal trunk and superficial femoral arteries.   PERIPHERAL VASCULAR BALLOON ANGIOPLASTY  12/27/2021   Procedure: PERIPHERAL  VASCULAR BALLOON ANGIOPLASTY;  Surgeon: Gretta Lonni PARAS, MD;  Location: [facility];  Service: Cardiovascular;;   PERIPHERAL VASCULAR INTERVENTION  04/19/2019   Procedure: PERIPHERAL VASCULAR INTERVENTION;  Surgeon: Gretta Lonni PARAS, MD;  Location: [facility];  Service: Cardiovascular;;  right external iliac   PERIPHERAL VASCULAR INTERVENTION  12/27/2021   Procedure: PERIPHERAL VASCULAR INTERVENTION;  Surgeon: Gretta Lonni PARAS, MD;  Location: [facility];  Service: Cardiovascular;;   TEE WITHOUT CARDIOVERSION N/A 04/19/2019   Procedure: TRANSESOPHAGEAL ECHOCARDIOGRAM (TEE);  Surgeon: Kate Lonni CROME, MD;  Location: [facility] ENDOSCOPY;  Service: Endoscopy;  Laterality: N/A;   Social History:  reports that he quit smoking about 21 months ago. His smoking use included cigarettes. He has never been exposed to tobacco smoke. He has quit using smokeless tobacco. He reports that he does not drink alcohol and does not use drugs. Family History:  Family History  Problem Relation Age of Onset   Colon cancer Neg Hx    Esophageal cancer Neg Hx    Pancreatic cancer Neg Hx    Liver cancer Neg Hx    Stomach cancer Neg Hx      HOME MEDICATIONS: Allergies as of 11/06/2023       Reactions   Pork-derived Products Other (See Comments)   Does not eat        Medication List  Accurate as of November 06, 2023  9:47 AM. If you have any questions, ask your nurse or doctor.          acetaminophen  500 MG tablet Commonly known as: TYLENOL  Take 500 mg by mouth every 8 (eight) hours as needed for mild pain (pain score 1-3) or moderate pain (pain score 4-6).   aspirin  EC 81 MG tablet Take 81 mg by mouth daily. Swallow whole.   atorvastatin  20 MG tablet Commonly known as: LIPITOR Take 1 tablet by mouth once daily   clopidogrel  75 MG tablet Commonly known as: PLAVIX  Take 1 tablet (75 mg total) by mouth daily with breakfast.   [employer] MAGNESIUM  PO Take 1 capsule by  mouth daily at 12 noon. Magnesium  citrate 250   cyanocobalamin 1000 MCG tablet Commonly known as: VITAMIN B12 Take 1,000 mcg by mouth daily.   Dexcom G7 Sensor Misc 1 Device by Does not apply route as directed.   empagliflozin  25 MG Tabs tablet Commonly known as: Jardiance  Take 1 tablet (25 mg total) by mouth daily before breakfast.   insulin  lispro 100 UNIT/ML KwikPen Commonly known as: HumaLOG  KwikPen Inject 7 Units into the skin 3 (three) times daily.   Lantus  SoloStar 100 UNIT/ML Solostar Pen Generic drug: insulin  glargine Inject 32 Units into the skin daily.   lisinopril  20 MG tablet Commonly known as: ZESTRIL  Take 1 tablet (20 mg total) by mouth daily.   methimazole  5 MG tablet Commonly known as: TAPAZOLE  Take 1 tablet (5 mg total) by mouth 2 (two) times daily.   omeprazole  20 MG capsule Commonly known as: PRILOSEC TAKE 1 CAPSULE BY MOUTH DAILY         OBJECTIVE:   Vital Signs: BP 122/78 (BP Location: Left Arm, Patient Position: Sitting, Cuff Size: Normal)   Pulse 90   Wt 221 lb (100.2 kg)   SpO2 98%   BMI 33.60 kg/m   Wt Readings from Last 3 Encounters:  11/06/23 221 lb (100.2 kg)  09/23/23 220 lb 12.8 oz (100.2 kg)  08/14/23 212 lb (96.2 kg)     Exam: General: Pt appears well and is in NAD  Lungs: Clear with good BS bilat   Heart: RRR   Extremities: No pretibial edema.   Neuro: MS is good with appropriate affect, pt is alert and Ox3   DM Foot exam 06/26/2023 per podiatry      DATA REVIEWED:  Lab Results  Component Value Date   HGBA1C 7.1 (A) 11/06/2023   HGBA1C 6.9 (A) 08/06/2023   HGBA1C 6.5 (A) 03/04/2022    Latest Reference Range & Units 08/14/23 05:51  Sodium 135 - 145 mmol/L 140  Potassium 3.5 - 5.1 mmol/L 4.0  Chloride 98 - 111 mmol/L 106  Glucose 70 - 99 mg/dL 899 (H)  BUN 6 - 20 mg/dL 21 (H)  Creatinine 9.38 - 1.24 mg/dL 8.89  Calcium  Ionized 1.15 - 1.40 mmol/L 1.29     Labs 07/09/2023  TSH <0.05 uIU/mL Free T4 2.3  NG/DL TSI 7.67 (9.99-9.44) TPO antibodies 1    ASSESSMENT / PLAN / RECOMMENDATIONS:   1) Type 2 Diabetes Mellitus, with improving glycemic control , With neuropathic, retinopathic and macrovascular complications as well as microalbuminuria- Most recent A1c of 7.1%. Goal A1c <7.0%.     -A1c is trending up -Patient has been eating more up to 4 meals a day -I have recommended GLP-1 agonist, historically I been trying to avoid this due to severe GERD, but I have suggested starting  a small dose and see how his GERD symptoms - A new prescription for Dexcom was sent and a note to our prior authorization team has been sent   MEDICATIONS: Lantus  32 units daily  Continue Humalog  8-10 units with each meal  Continue  Jardiance  25 mg daily Start Ozempic  0.25 mg weekly   EDUCATION / INSTRUCTIONS: BG monitoring instructions: Patient is instructed to check his blood sugars 2 times daily before meals times a day. Call [facility] if: BG persistently < 70  I reviewed the Rule of 15 for the treatment of hypoglycemia in detail with the patient. Literature supplied.   2) Diabetic complications:  Eye: Does not have known diabetic retinopathy.  Neuro/ Feet: Does  have known diabetic peripheral neuropathy .  Renal: Patient does not have known baseline CKD. He   is  on an ACEI/ARB at present.     3) Hyperthyroidism   - Patient is clinically euthyroid -He continues to be biochemically hyperthyroid -He assures me compliance, will increase methimazole  as below  Medication  methimazole  5 mg, 2 tabs QAM and 1 tab QPM  5) Graves' Disease:  -TSI elevated - I did explain to the patient that Graves' disease is an autoimmune hereditary condition - He follows with ophthalmology   6)GERD:  - Symptom  are not well-controlled - Uses Tums as needed - He is out of PPI, will refill - I have placed a referral to GI, they have contacted him multiple times to schedule without  success - I did caution him to monitor his symptoms while he is on GLP-1 agonist  Medication Omeprazole  40 mg daily   F/U in 4 months   Signed electronically by: Stefano Redgie Butts, MD  [facility]  [facility] [address] [place] [place], [place] [address] Phone: [phone] FAX: [phone]   CC: Dorina Dallas RIGGERS [address] [facility] [address] [place] [address] Phone: [phone]  Fax: [phone]  Return to Endocrinology clinic as below: Future Appointments  Date Time Provider Department Center  11/06/2023 10:50 AM Amarisa Wilinski, Donell Redgie, MD [facility] None  01/27/2024  8:00 AM [facility]  01/27/2024  9:00 AM [facility]  01/27/2024  9:30 AM [facility]  01/27/2024 10:15 AM [facility] PA-2 [facility]V

## 2023-11-06 NOTE — Telephone Encounter (Signed)
 Pharmacy Patient Advocate Encounter   Received notification from CoverMyMeds that prior authorization for Ozempic  is required/requested.   Insurance verification completed.   The patient is insured through [facility] .   Per test claim: PA required and submitted KEY/EOC/Request #: [id number] from 11/06/23 to 11/05/24

## 2023-11-06 NOTE — Patient Instructions (Addendum)
-   Continue Lantus  32 units daily  - Continue  Humalog  10  units with meals  - Continue  Jardiance  25 mg daily  - Start Ozempic  0.25 mg weekly      HOW TO TREAT LOW BLOOD SUGARS (Blood sugar LESS THAN 70 MG/DL) Please follow the RULE OF 15 for the treatment of hypoglycemia treatment (when your (blood sugars are less than 70 mg/dL)   STEP 1: Take 15 grams of carbohydrates when your blood sugar is low, which includes:  3-4 GLUCOSE TABS  OR 3-4 OZ OF JUICE OR REGULAR SODA OR ONE TUBE OF GLUCOSE GEL    STEP 2: RECHECK blood sugar in 15 MINUTES STEP 3: If your blood sugar is still low at the 15 minute recheck --> then, go back to STEP 1 and treat AGAIN with another 15 grams of carbohydrates.

## 2023-11-06 NOTE — Progress Notes (Signed)
 Diabetes Self-Management Education  Visit Type: Follow-up  Appt. Start Time: 0900 Appt. End Time: 0930  11/16/2023  [name], identified by name and date of birth, is a 48 y.o. male with a diagnosis of Diabetes:  .   ASSESSMENT  Patient is here today with an Arabic Interpretor Sarra.from [facility].  He was last seen by myself 05/08/2023.  He states that he needs a prescription for omeprazole  and and an increased volume of Humalog  as he is running out too soon. He states that he consistently remembers his insulin . He states that the Methimazole  makes him very hungry and he wakes and eats in the middle of the night.  Fruit, hummus and dark chocolate. He is not using the Dexcom as it is too expensive. - Dexcom placed at MD visit today and prior auth started. He reports checking his blood glucose was 160 in the am but ate during the night. A1C 6.9% 08/06/2023 Walks in [employer] 30-60 minutes 4 days per week.  Weight hx: 68 227 lbs 11/06/2023 222 lbs 05/08/23 213 lbs 12/05/2022 214 lbs 11/07/2022 224 lbs 05/09/2022 245 lbs 01/11/2022   History includes Type 2 Diabetes (2008), HTN, dyslipidemia, PVD, PVD, depression, dentures Smokes, eats 1-2 meals per day. Labs: 6.5% 03/04/2022 decreased from A1C 7.5% 08/31/2021 and Cholesterol 111, HDL 40, LDL 58, Triglycerides 68 on 12/24/2022 Medication includes Lantus  31-32 units each am, Humalog  8-10 units before each meal, Jardiance , probiotic, vitamin B-12, magnesium  CGM:  Dexcom G7 (currently not wearing as this is too expensive).  He states that he is checking his blood glucose once per day.   CGM Results from download:  11/2022  % Time CGM active:   79 %   (Goal >70%)  Average glucose:   116 mg/dL for 14 days  Glucose management indicator:   N/A  Time in range (70-180 mg/dL):   89 %   (Goal >29%)  Time High (181-250 mg/dL):   7 %   (Goal < 74%)  Time Very High (>250 mg/dL):    0 %   (Goal < 5%)  Time Low (54-69 mg/dL):   4 %   (Goal <5%)   Time Very Low (<54 mg/dL):   <1 %   (Goal <8%)  %CV (glucose variability)    36    Patient lives alone.  He is from [place]. Patient states that he has not worked since 2020 due to health and injuries.  He states that not working has caused him depression.  He does feel alone as his friends had to leave as he cannot support them. He is not on disability. He does receive food stamps. 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 Walk:  Walks 30-60 minutes daily inside at [place] or outside on nice days.    Diabetes Self-Management Education - 11/16/23 2207       Visit Information   Visit Type Follow-up      Psychosocial Assessment   Patient Belief/Attitude about Diabetes Motivated to manage diabetes    What is the hardest part about your diabetes right now, causing you the most concern, or is the most worrisome to you about your diabetes?   Making healty food and beverage choices    Self-care barriers None    Self-management support None    Other persons present Patient    Patient Concerns Nutrition/Meal planning    Special Needs None    Preferred Learning Style No preference indicated  Learning Readiness Ready    How often do you need to have someone help you when you read instructions, pamphlets, or other written materials from your doctor or pharmacy? 1 - Never      Pre-Education Assessment   Patient understands the diabetes disease and treatment process. Comprehends key points    Patient understands incorporating nutritional management into lifestyle. Needs Review    Patient undertands incorporating physical activity into lifestyle. Comprehends key points    Patient understands using medications safely. Comprehends key points    Patient understands monitoring blood glucose, interpreting and using results Comprehends key points    Patient understands prevention, detection, and treatment of acute complications. Comprehends key points    Patient understands prevention,  detection, and treatment of chronic complications. Compreheands key points    Patient understands how to develop strategies to address psychosocial issues. Needs Review    Patient understands how to develop strategies to promote health/change behavior. Needs Review      Complications   How often do you check your blood sugar? 0 times/day (not testing)      Dietary Intake   Breakfast none    Snack (morning) boiled egg, tomato, olive oil, flat bread    Lunch rice, okra, beef    Snack (afternoon) 2 oranges    Dinner chick pea soup    Beverage(s) water, unsweetened green tea      Activity / Exercise   Activity / Exercise Type Light (walking / raking leaves)    How many days per week do you exercise? 6    How many minutes per day do you exercise? 30    Total minutes per week of exercise 180      Patient Education   Previous Diabetes Education Yes   05/08/2023.   Healthy Eating Meal options for control of blood glucose level and chronic complications.    Medications Reviewed patients medication for diabetes, action, purpose, timing of dose and side effects.    Diabetes Stress and Support Worked with patient to identify barriers to care and solutions;Identified and addressed patients feelings and concerns about diabetes      Individualized Goals (developed by patient)   Nutrition General guidelines for healthy choices and portions discussed    Physical Activity Exercise 5-7 days per week;30 minutes per day    Medications take my medication as prescribed    Monitoring  Consistenly use CGM    Problem Solving Eating Pattern    Reducing Risk examine blood glucose patterns;do foot checks daily;treat hypoglycemia with 15 grams of carbs if blood glucose less than 70mg /dL    Health Coping Ask for help with psychological, social, or emotional issues      Patient Self-Evaluation of Goals - Patient rates self as meeting previously set goals (% of time)   Nutrition >75% (most of the time)     Physical Activity >75% (most of the time)    Medications >75% (most of the time)    Monitoring >75% (most of the time)    Problem Solving and behavior change strategies  >75% (most of the time)    Reducing Risk (treating acute and chronic complications) >75% (most of the time)    Health Coping 50 - 75 % (half of the time)      Post-Education Assessment   Patient understands the diabetes disease and treatment process. Comprehends key points    Patient understands incorporating nutritional management into lifestyle. Needs Review    Patient undertands incorporating physical activity  into lifestyle. Comprehends key points    Patient understands using medications safely. Comphrehends key points    Patient understands monitoring blood glucose, interpreting and using results Comprehends key points    Patient understands prevention, detection, and treatment of acute complications. Comprehends key points    Patient understands prevention, detection, and treatment of chronic complications. Comprehends key points    Patient understands how to develop strategies to address psychosocial issues. Comprehends key points    Patient understands how to develop strategies to promote health/change behavior. Needs Review      Outcomes   Expected Outcomes Demonstrated interest in learning. Expect positive outcomes    Future DMSE 3-4 months    Program Status Not Completed      Subsequent Visit   Since your last visit have you continued or begun to take your medications as prescribed? Yes    Since your last visit have you experienced any weight changes? Gain    Weight Gain (lbs) 5    Since your last visit, are you checking your blood glucose at least once a day? No          Individualized Plan for Diabetes Self-Management Training:   Learning Objective:  Patient will have a greater understanding of diabetes self-management. Patient education plan is to attend individual and/or group sessions per assessed  needs and concerns.   Plan:   Patient Instructions  Keep up your good habits!             Continue to walk             Be mindful of your portion sizes  Half your plate should be vegetables  Aim to eat when you wake to see if this helps you avoid overnight snacks.  Find ways to cope with stress             Exercise             Pray             Enjoy nature   Expected Outcomes:  Demonstrated interest in learning. Expect positive outcomes  Education material provided:   If problems or questions, patient to contact team via:  Phone  Future DSME appointment: 3-4 months

## 2023-11-07 ENCOUNTER — Ambulatory Visit: Admitting: Dietician

## 2023-11-07 ENCOUNTER — Ambulatory Visit: Payer: Self-pay | Admitting: Internal Medicine

## 2023-11-07 ENCOUNTER — Telehealth: Payer: Self-pay | Admitting: Dietician

## 2023-11-07 LAB — TSH: TSH: 0.01 m[IU]/L — ABNORMAL LOW (ref 0.40–4.50)

## 2023-11-07 LAB — T3, FREE: T3, Free: 4.6 pg/mL — ABNORMAL HIGH (ref 2.3–4.2)

## 2023-11-07 LAB — T4, FREE: Free T4: 1.5 ng/dL (ref 0.8–1.8)

## 2023-11-07 MED ORDER — DEXCOM G7 SENSOR MISC: 1.0000 | 11 refills | Status: AC

## 2023-11-07 MED ORDER — METHIMAZOLE 5 MG PO TABS: 5.0000 mg | ORAL_TABLET | Freq: Three times a day (TID) | ORAL | 3 refills | Status: AC

## 2023-11-07 NOTE — Telephone Encounter (Signed)
 Please let the patient know that his thyroid  continues to be overactive and to increase methimazole  to 3 tablets total a day   I would suggest he takes 2 in the morning and 1 in the evening   Please recommend a pillbox to assure compliance    Thanks

## 2023-11-07 NOTE — Addendum Note (Signed)
 Addended by: OCTAVIO SHOUTS L on: 11/07/2023 10:00 AM   Modules accepted: Orders

## 2023-11-07 NOTE — Telephone Encounter (Signed)
Patient aware and will contact pharmacy

## 2023-11-07 NOTE — Telephone Encounter (Signed)
 Patient states that the [facility] Receiver that he received in the office yesterday is not working today.  He will come in today at 4 to problem solve this.  Leita Constable, RD, LDN, CDCES, DipACLM

## 2023-11-09 ENCOUNTER — Other Ambulatory Visit: Payer: Self-pay | Admitting: Medical

## 2023-12-10 ENCOUNTER — Other Ambulatory Visit: Payer: Self-pay | Admitting: Medical

## 2023-12-23 ENCOUNTER — Encounter (HOSPITAL_COMMUNITY)

## 2023-12-23 ENCOUNTER — Ambulatory Visit

## 2023-12-26 ENCOUNTER — Other Ambulatory Visit: Payer: Self-pay

## 2023-12-26 DIAGNOSIS — I739 Peripheral vascular disease, unspecified: Secondary | ICD-10-CM

## 2023-12-26 DIAGNOSIS — I70213 Atherosclerosis of native arteries of extremities with intermittent claudication, bilateral legs: Secondary | ICD-10-CM

## 2024-01-11 ENCOUNTER — Other Ambulatory Visit: Payer: Self-pay | Admitting: Medical

## 2024-01-27 ENCOUNTER — Ambulatory Visit (HOSPITAL_BASED_OUTPATIENT_CLINIC_OR_DEPARTMENT_OTHER)
Admission: RE | Admit: 2024-01-27 | Discharge: 2024-01-27 | Disposition: A | Discharge status: Home / Self Care | Source: Ambulatory Visit | Attending: Vascular Surgery | Admitting: Vascular Surgery

## 2024-01-27 ENCOUNTER — Ambulatory Visit (HOSPITAL_COMMUNITY)
Admission: RE | Admit: 2024-01-27 | Discharge: 2024-01-27 | Disposition: A | Discharge status: Home / Self Care | Source: Ambulatory Visit | Attending: Vascular Surgery | Admitting: Vascular Surgery

## 2024-01-27 ENCOUNTER — Ambulatory Visit: Admitting: Physician Assistant

## 2024-01-27 VITALS — BP 162/99 | HR 88 | Temp 97.2°F | Resp 18 | Ht 68.0 in | Wt 238.3 lb

## 2024-01-27 DIAGNOSIS — I739 Peripheral vascular disease, unspecified: Secondary | ICD-10-CM

## 2024-01-27 DIAGNOSIS — I70435 Atherosclerosis of autologous vein bypass graft(s) of the right leg with ulceration of other part of foot: Secondary | ICD-10-CM | POA: Insufficient documentation

## 2024-01-27 DIAGNOSIS — I70213 Atherosclerosis of native arteries of extremities with intermittent claudication, bilateral legs: Secondary | ICD-10-CM | POA: Insufficient documentation

## 2024-01-27 LAB — VAS US ABI WITH/WO TBI
Left ABI: 0.81
Right ABI: 0.96

## 2024-01-27 NOTE — Progress Notes (Signed)
 Office Note     CC:  follow up Requesting Provider:  Dorina Loving, PA-C  HPI: Thomas [name] is a 48 y.o. (1975/09/07) male who presents for surveillance of PAD.  He is well-known to [facility] with numerous interventions in the past including right external iliac stenting on 04/19/2019.  He then underwent right above-the-knee popliteal to TP trunk bypass with vein on 04/22/2019 due to tissue loss.  In 2023 he required right SFA stenting as well as TP trunk and PT angioplasty for a threatened bypass.  He also had a left SFA, below the knee popliteal, and PT angioplasty in 2022 for claudication.  Most recently he underwent drug-coated balloon angioplasty of the in-stent stenosis of the right SFA in May of this year by Dr. Gretta.  He denies any claudication, rest pain, or tissue loss of bilateral lower extremities.  He has been taking his aspirin , Plavix , and statin daily.  He denies tobacco use.  A video interpreter was used during today's visit.   Past Medical History:  Diagnosis Date   Depression 01/27/2015   Diabetes mellitus without complication (HCC)    Hyperlipidemia    Hypertension    Peripheral arterial disease    Peripheral vascular disease     Past Surgical History:  Procedure Laterality Date   ABDOMINAL AORTOGRAM N/A 08/14/2023   Procedure: ABDOMINAL AORTOGRAM;  Surgeon: Gretta Lonni PARAS, MD;  Location: [facility];  Service: Cardiovascular;  Laterality: N/A;   ABDOMINAL AORTOGRAM W/LOWER EXTREMITY Right 04/19/2019   Procedure: ABDOMINAL AORTOGRAM W/LOWER EXTREMITY;  Surgeon: Gretta Lonni PARAS, MD;  Location: [facility];  Service: Cardiovascular;  Laterality: Right;   ABDOMINAL AORTOGRAM W/LOWER EXTREMITY Right 09/16/2019   Procedure: ABDOMINAL AORTOGRAM W/LOWER EXTREMITY;  Surgeon: Gretta Lonni PARAS, MD;  Location: [facility];  Service: Cardiovascular;  Laterality: Right;   ABDOMINAL AORTOGRAM W/LOWER EXTREMITY N/A 08/17/2020   Procedure: ABDOMINAL  AORTOGRAM W/LOWER EXTREMITY;  Surgeon: Gretta Lonni PARAS, MD;  Location: [facility];  Service: Cardiovascular;  Laterality: N/A;   ABDOMINAL AORTOGRAM W/LOWER EXTREMITY N/A 12/27/2021   Procedure: ABDOMINAL AORTOGRAM W/LOWER EXTREMITY;  Surgeon: Gretta Lonni PARAS, MD;  Location: [facility];  Service: Cardiovascular;  Laterality: N/A;   FEMORAL-TIBIAL BYPASS GRAFT Right 04/22/2019   Procedure: Right  FEMORAL-TIBIAL ARTERY BYPASS GRAFT with Harvest of Saphenous vein RIGHT SUPERFICIAL FEMORAL ARTERY TO TIBIOPERONEAL TRUNK BYPASS;  Surgeon: Gretta Lonni PARAS, MD;  Location: [facility];  Service: Vascular;  Laterality: Right;   IR CERVICAL/THORACIC DISC ASPIRATION W/IMAG GUIDE  04/15/2019   IR FLUORO GUIDED NEEDLE PLC ASPIRATION/INJECTION LOC  04/15/2019   kidney stones  2009   LOWER EXTREMITY ANGIOGRAPHY N/A 08/14/2023   Procedure: Lower Extremity Angiography;  Surgeon: Gretta Lonni PARAS, MD;  Location: [facility];  Service: Cardiovascular;  Laterality: N/A;   LOWER EXTREMITY INTERVENTION N/A 08/14/2023   Procedure: LOWER EXTREMITY INTERVENTION;  Surgeon: Gretta Lonni PARAS, MD;  [facility]: [facility];  Service: Cardiovascular;  Laterality: N/A;   PERIPHERAL VASCULAR BALLOON ANGIOPLASTY Right 09/16/2019   Procedure: PERIPHERAL VASCULAR BALLOON ANGIOPLASTY;  Surgeon: Gretta Lonni PARAS, MD;  [facility]: [facility];  Service: Cardiovascular;  Laterality: Right;  ext iliac DCB   PERIPHERAL VASCULAR BALLOON ANGIOPLASTY  08/17/2020   Procedure: PERIPHERAL VASCULAR BALLOON ANGIOPLASTY;  Surgeon: Gretta Lonni PARAS, MD;  [facility];  Service: Cardiovascular;;  Right TP trunk   PERIPHERAL VASCULAR BALLOON ANGIOPLASTY Left 08/24/2020   Procedure: PERIPHERAL  VASCULAR BALLOON ANGIOPLASTY;  Surgeon: Gretta Lonni PARAS, MD;  [facility];  Service: Cardiovascular;  Laterality: Left;  Tibioperoneal trunk and superficial femoral arteries.    PERIPHERAL VASCULAR BALLOON ANGIOPLASTY  12/27/2021   Procedure: PERIPHERAL VASCULAR BALLOON ANGIOPLASTY;  Surgeon: Gretta Lonni PARAS, MD;  [facility];  Service: Cardiovascular;;   PERIPHERAL VASCULAR INTERVENTION  04/19/2019   Procedure: PERIPHERAL VASCULAR INTERVENTION;  Surgeon: Gretta Lonni PARAS, MD;  [facility];  Service: Cardiovascular;;  right external iliac   PERIPHERAL VASCULAR INTERVENTION  12/27/2021   Procedure: PERIPHERAL VASCULAR INTERVENTION;  Surgeon: Gretta Lonni PARAS, MD;  [facility];  Service: Cardiovascular;;   TEE WITHOUT CARDIOVERSION N/A 04/19/2019   Procedure: TRANSESOPHAGEAL ECHOCARDIOGRAM (TEE);  Surgeon: Kate Lonni CROME, MD;  Location: [facility] ENDOSCOPY;  Service: Endoscopy;  Laterality: N/A;    Social History   Socioeconomic History   Marital status: Single    Spouse name: Not on file   Number of children: Not on file   Years of education: Not on file   Highest education level: Not on file  Occupational History   Occupation: Unemployed  Tobacco Use   Smoking status: Former    Current packs/day: 0.00    Types: Cigarettes    Quit date: 01/13/2022    Years since quitting: 2.0    Passive exposure: Never   Smokeless tobacco: Former   Tobacco comments:    2 cigarrettes a day  Vaping Use   Vaping status: Never Used  Substance and Sexual Activity   Alcohol use: No    Alcohol/week: 0.0 standard drinks of alcohol   Drug use: No   Sexual activity: Not on file  Other Topics Concern   Not on file  Social History Narrative   Are you right handed or left handed? Right Handed    Are you currently employed ? No work    What is your current occupation?   Do you live at home alone? Yes   Who lives with you? No   What type of home do you live in: 1 story or 2 story? One story home       Social Drivers of Corporate investment banker Strain: Not on file  Food Insecurity: Not on file  Transportation  Needs: Not on file  Physical Activity: Not on file  Stress: Not on file  Social Connections: Not on file  Intimate Partner Violence: Not on file    Family History  Problem Relation Age of Onset   Colon cancer Neg Hx    Esophageal cancer Neg Hx    Pancreatic cancer Neg Hx    Liver cancer Neg Hx    Stomach cancer Neg Hx     Current Outpatient Medications  Medication Sig Dispense Refill   acetaminophen  (TYLENOL ) 500 MG tablet Take 500 mg by mouth every 8 (eight) hours as needed for mild pain (pain score 1-3) or moderate pain (pain score 4-6).     aspirin  EC 81 MG tablet Take 81 mg by mouth daily. Swallow whole.     atorvastatin  (LIPITOR) 20 MG tablet Take 1 tablet by mouth once daily 30 tablet 0   clopidogrel  (PLAVIX ) 75 MG tablet Take 1 tablet (75 mg total) by mouth daily with breakfast. 90 tablet 2   Continuous Glucose Sensor (DEXCOM G7 SENSOR) MISC 1 Device by Does not apply route as directed. 3 each 11   empagliflozin  (JARDIANCE ) 25 MG TABS tablet Take  1 tablet (25 mg total) by mouth daily before breakfast. 90 tablet 3   insulin  glargine (LANTUS  SOLOSTAR) 100 UNIT/ML Solostar Pen Inject 32 Units into the skin daily. 30 mL 3   insulin  lispro (HUMALOG  KWIKPEN) 100 UNIT/ML KwikPen Max daily 45 units 45 mL 3   lisinopril  (ZESTRIL ) 20 MG tablet Take 1 tablet (20 mg total) by mouth daily. 90 tablet 3   methimazole  (TAPAZOLE ) 5 MG tablet Take 1 tablet (5 mg total) by mouth 3 (three) times daily. 270 tablet 3   omeprazole  (PRILOSEC) 40 MG capsule Take 1 capsule (40 mg total) by mouth daily. 90 capsule 3   Semaglutide ,0.25 or 0.5MG /DOS, (OZEMPIC , 0.25 OR 0.5 MG/DOSE,) 2 MG/3ML SOPN Inject 0.25 mg into the skin once a week. 9 mL 3   [facility] MAGNESIUM  PO Take 1 capsule by mouth daily at 12 noon. Magnesium  citrate 250 (Patient not taking: Reported on 01/27/2024)     cyanocobalamin (VITAMIN B12) 1000 MCG tablet Take 1,000 mcg by mouth daily. (Patient not taking: Reported on 01/27/2024)     No  current facility-administered medications for this visit.    Allergies  Allergen Reactions   Porcine (Pork) Protein-Containing Drug Products Other (See Comments)    Does not eat     REVIEW OF SYSTEMS:  Negative unless noted in HPI [X]  denotes positive finding, [ ]  denotes negative finding Cardiac  Comments:  Chest pain or chest pressure:    Shortness of breath upon exertion:    Short of breath when lying flat:    Irregular heart rhythm:        Vascular    Pain in calf, thigh, or hip brought on by ambulation:    Pain in feet at night that wakes you up from your sleep:     Blood clot in your veins:    Leg swelling:         Pulmonary    Oxygen at home:    Productive cough:     Wheezing:         Neurologic    Sudden weakness in arms or legs:     Sudden numbness in arms or legs:     Sudden onset of difficulty speaking or slurred speech:    Temporary loss of vision in one eye:     Problems with dizziness:         Gastrointestinal    Blood in stool:     Vomited blood:         Genitourinary    Burning when urinating:     Blood in urine:        Psychiatric    Major depression:         Hematologic    Bleeding problems:    Problems with blood clotting too easily:        Skin    Rashes or ulcers:        Constitutional    Fever or chills:      PHYSICAL EXAMINATION:  Vitals:   01/27/24 0913  BP: (!) 162/99  Pulse: 88  Resp: 18  Temp: (!) 97.2 F (36.2 C)  TempSrc: Temporal  Weight: 238 lb 4.8 oz (108.1 kg)  Height: 5' 8 (1.727 m)    General:  WDWN in NAD; vital signs documented above Gait: Not observed HENT: WNL, normocephalic Pulmonary: normal non-labored breathing Cardiac: regular HR Abdomen: soft, NT, no masses Skin: without rashes Vascular Exam/Pulses: brisk DP signals by doppler Extremities: without ischemic changes, without Gangrene ,  without cellulitis; without open wounds;  Musculoskeletal: no muscle wasting or atrophy  Neurologic: A&O X  3 Psychiatric:  The pt has Normal affect.   Non-Invasive Vascular Imaging:   Right external iliac artery stent patent with a 238 cm velocity mid stent  Right SFA stent has mid stent restenosis of 383 cm/s which has increased from 220 cm/s 3 months ago  Right above-the-knee popliteal to TP trunk bypass widely patent  ABI/TBIToday's ABIToday's TBIPrevious ABIPrevious TBI  +-------+-----------+-----------+------------+------------+  Right 0.96       0.65       0.95        0.48          +-------+-----------+-----------+------------+------------+  Left  0.81       0.39       0.68        0.26             ASSESSMENT/PLAN:: 48 y.o. male here for follow up for surveillance of PAD.  Most recent intervention was drug-coated balloon angioplasty of the right SFA stent in May of this year due to recurrent in-stent stenosis and threatened above-the-knee popliteal to TP trunk bypass  Subjectively, Mr. Sherrod is doing well and denies any claudication symptoms.  He is also without rest pain or tissue loss.  He underwent drug-coated balloon angioplasty of recurrent in-stent stenosis of the right SFA in May of this year by Dr. Gretta due to a threatened above-the-knee popliteal to TP trunk bypass.  He was seen 3 months ago and noted to have right SFA in-stent velocity of 220 cm/s after drug-coated balloon angioplasty.  Unfortunately ultrasound today shows an increase in the SFA stent velocity to 383 cm/s.  This is concerning for impending thrombosis which threatens the right leg bypass.  We discussed proceeding with angiography again to evaluate and possibly treat this.  We also discussed the durability of the stent given that he is only 48 years old.  He is aware he may require bypass surgery from the right common femoral artery in the future.  Findings will be discussed with Dr. Gretta to see if he agrees with proceeding with angiography to address recurrent stenosis.   Donnice Sender,  PA-C [facility] [phone]  Clinic MD:   [place]da

## 2024-01-28 ENCOUNTER — Other Ambulatory Visit: Payer: Self-pay

## 2024-01-28 DIAGNOSIS — I70213 Atherosclerosis of native arteries of extremities with intermittent claudication, bilateral legs: Secondary | ICD-10-CM

## 2024-02-02 NOTE — Progress Notes (Unsigned)
 Name: Thomas Thomas [name]  Age/ Sex: 48 y.o., male   MRN/ DOB: [id number], 1975-06-04     PCP: Thomas Thomas [name]   Reason for Endocrinology Evaluation: Type 2 Diabetes Mellitus  Initial Endocrine Consultative Visit: 09/15/2020    PATIENT IDENTIFIER: Mr. Thomas Thomas [name] is a 48 y.o. male with a past medical history of T2DM, HTN, and dyslipidemia. The patient has followed with Endocrinology clinic since 09/15/2020 for consultative assistance with management of his diabetes.  DIABETIC HISTORY:  Thomas Thomas [name] was diagnosed with DM in 2008 .  He was on metformin  in the past. His hemoglobin A1c has ranged from 6.7% in 2021, peaking at 15.8% in 2020  On his initial visit with me he had an A1c of 11.3%, he was only on basal insulin , he was having a lot of heartburn, and GI symptoms and we opted to start him on prandial insulin    Started jardiance  04/2021  Started Ozempic  10/2023 with an A1c of 7.1%   THYROID  HISTORY: During evaluation for diplopia through ophthalmology 06/2023 patient was noted with hyperthyroidism, with suppressed TSH 0.05 u IU/mL and elevated free T4.  As well as elevated TSI  Thomas [name] family history of thyroid  disease   SUBJECTIVE:   During the last visit (11/06/2023): A1c 7.1%     Today (02/02/2024): Thomas [name] is here for follow-up on diabetes management.  He checks his blood sugars occasionally, he has not been using the Dexcom, patient states he discussed prohibitive .Thomas [name] recent hypoglycemia       He is s/p right lower extremity angiogram with drug-coated balloon angioplasty of in-stent right SFA stenosis 08/14/2023.  Patient is scheduled for abdominal aortogram in January, 2026  He continues to follow-up with ophthalmology for diplopia, MRI did not show significantly enlarged extra ocular muscles, patient declined Fidela  He continues to follow up with podiatry (Dr. Alona) for right third toe chronic infection .  Continues with heartburn issues, takes tums Denies  palpitations  Denies constipation  or diarrhea   GLUCOSE METER: 14 day average 233 mg/dL  90 day average 849 mg/dL   899-765 mg/dL   HOME ENDOCRINE REGIMEN:  Humalog  8-10 units with each meal  Lantus  32  units daily  Ozempic  0.25 mg weekly Jardiance  25 mg daily  Methimazole  5 mg, 3 tabs daily    Statin: Yes ACE-I/ARB: Yes Prior Diabetic Education: Yes    DIABETIC COMPLICATIONS: Microvascular complications:   Denies: CKD, neuropathy, retinopathy Last Eye Exam: Completed 03/2021  Macrovascular complications:  PVD ( S/P right external iliac angioplasty and stent (04/19/2019) followed by bypass 08/2020, left angiogram 08/2022 Denies: CAD, CVA   HISTORY:  Past Medical History:  Past Medical History:  Diagnosis Date   Depression 01/27/2015   Diabetes mellitus without complication (HCC)    Hyperlipidemia    Hypertension    Peripheral arterial disease    Peripheral vascular disease    Past Surgical History:  Past Surgical History:  Procedure Laterality Date   ABDOMINAL AORTOGRAM N/A 08/14/2023   Procedure: ABDOMINAL AORTOGRAM;  Surgeon: Gretta Lonni PARAS, MD;  Location: [facility];  Service: Cardiovascular;  Laterality: N/A;   ABDOMINAL AORTOGRAM W/LOWER EXTREMITY Right 04/19/2019   Procedure: ABDOMINAL AORTOGRAM W/LOWER EXTREMITY;  Surgeon: Gretta Lonni PARAS, MD;  Location: [facility];  Service: Cardiovascular;  Laterality: Right;   ABDOMINAL AORTOGRAM W/LOWER EXTREMITY Right 09/16/2019   Procedure: ABDOMINAL AORTOGRAM W/LOWER EXTREMITY;  Surgeon: Gretta Lonni PARAS, MD;  Location: [facility];  Service: Cardiovascular;  Laterality:  Right;   ABDOMINAL AORTOGRAM W/LOWER EXTREMITY N/A 08/17/2020   Procedure: ABDOMINAL AORTOGRAM W/LOWER EXTREMITY;  Surgeon: Gretta Lonni PARAS, MD;  Location: [facility];  Service: Cardiovascular;  Laterality: N/A;   ABDOMINAL AORTOGRAM W/LOWER EXTREMITY N/A 12/27/2021   Procedure: ABDOMINAL AORTOGRAM W/LOWER  EXTREMITY;  Surgeon: Gretta Lonni PARAS, MD;  Location: [facility];  Service: Cardiovascular;  Laterality: N/A;   FEMORAL-TIBIAL BYPASS GRAFT Right 04/22/2019   Procedure: Right  FEMORAL-TIBIAL ARTERY BYPASS GRAFT with Harvest of Saphenous vein RIGHT SUPERFICIAL FEMORAL ARTERY TO TIBIOPERONEAL TRUNK BYPASS;  Surgeon: Gretta Lonni PARAS, MD;  Location: [facility];  Service: Vascular;  Laterality: Right;   IR CERVICAL/THORACIC DISC ASPIRATION W/IMAG GUIDE  04/15/2019   IR FLUORO GUIDED NEEDLE PLC ASPIRATION/INJECTION LOC  04/15/2019   kidney stones  2009   LOWER EXTREMITY ANGIOGRAPHY N/A 08/14/2023   Procedure: Lower Extremity Angiography;  Surgeon: Gretta Lonni PARAS, MD;  Location: [facility];  Service: Cardiovascular;  Laterality: N/A;   LOWER EXTREMITY INTERVENTION N/A 08/14/2023   Procedure: LOWER EXTREMITY INTERVENTION;  Surgeon: Gretta Lonni PARAS, MD;  [facility];  Service: Cardiovascular;  Laterality: N/A;   PERIPHERAL VASCULAR BALLOON ANGIOPLASTY Right 09/16/2019   Procedure: PERIPHERAL VASCULAR BALLOON ANGIOPLASTY;  Surgeon: Gretta Lonni PARAS, MD;  [facility]: [facility];  Service: Cardiovascular;  Laterality: Right;  ext iliac DCB   PERIPHERAL VASCULAR BALLOON ANGIOPLASTY  08/17/2020   Procedure: PERIPHERAL VASCULAR BALLOON ANGIOPLASTY;  Surgeon: Gretta Lonni PARAS, MD;  [facility];  Service: Cardiovascular;;  Right TP trunk   PERIPHERAL VASCULAR BALLOON ANGIOPLASTY Left 08/24/2020   Procedure: PERIPHERAL VASCULAR BALLOON ANGIOPLASTY;  Surgeon: Gretta Lonni PARAS, MD;  [facility];  Service: Cardiovascular;  Laterality: Left;  Tibioperoneal trunk and superficial femoral arteries.   PERIPHERAL VASCULAR BALLOON ANGIOPLASTY  12/27/2021   Procedure: PERIPHERAL VASCULAR BALLOON ANGIOPLASTY;  Surgeon: Gretta Lonni PARAS, MD;  [facility];  Service: Cardiovascular;;   PERIPHERAL VASCULAR INTERVENTION   04/19/2019   Procedure: PERIPHERAL VASCULAR INTERVENTION;  Surgeon: Gretta Lonni PARAS, MD;  [facility];  Service: Cardiovascular;;  right external iliac   PERIPHERAL VASCULAR INTERVENTION  12/27/2021   Procedure: PERIPHERAL VASCULAR INTERVENTION;  Surgeon: Gretta Lonni PARAS, MD;  [facility];  Service: Cardiovascular;;   TEE WITHOUT CARDIOVERSION N/A 04/19/2019   Procedure: TRANSESOPHAGEAL ECHOCARDIOGRAM (TEE);  Surgeon: Kate Lonni CROME, MD;  Location: [facility];  Service: Endoscopy;  Laterality: N/A;   Social History:  reports that he quit smoking about 2 years ago. His smoking use included cigarettes. He has never been exposed to tobacco smoke. He has quit using smokeless tobacco. He reports that he does not drink alcohol and does not use drugs. Family History:  Family History  Problem Relation Age of Onset   Colon cancer Neg Hx    Esophageal cancer Neg Hx    Pancreatic cancer Neg Hx    Liver cancer Neg Hx    Stomach cancer Neg Hx      HOME MEDICATIONS: Allergies as of 02/03/2024       Reactions   Porcine (pork) Protein-containing Drug Products Other (See Comments)   Does not eat        Medication List        Accurate as of February 02, 2024  1:36 PM. If you have any questions, ask your nurse or doctor.  acetaminophen  500 MG tablet Commonly known as: TYLENOL  Take 500 mg by mouth every 8 (eight) hours as needed for mild pain (pain score 1-3) or moderate pain (pain score 4-6).   aspirin  EC 81 MG tablet Take 81 mg by mouth daily. Swallow whole.   atorvastatin  20 MG tablet Commonly known as: LIPITOR Take 1 tablet by mouth once daily   clopidogrel  75 MG tablet Commonly known as: PLAVIX  Take 1 tablet (75 mg total) by mouth daily with breakfast.   [employer] MAGNESIUM  PO Take 1 capsule by mouth daily at 12 noon. Magnesium  citrate 250   cyanocobalamin 1000 MCG tablet Commonly known as: VITAMIN B12 Take 1,000 mcg by  mouth daily.   Dexcom G7 Sensor Misc 1 Device by Does not apply route as directed.   empagliflozin  25 MG Tabs tablet Commonly known as: Jardiance  Take 1 tablet (25 mg total) by mouth daily before breakfast.   insulin  lispro 100 UNIT/ML KwikPen Commonly known as: HumaLOG  KwikPen Max daily 45 units   Lantus  SoloStar 100 UNIT/ML Solostar Pen Generic drug: insulin  glargine Inject 32 Units into the skin daily.   lisinopril  20 MG tablet Commonly known as: ZESTRIL  Take 1 tablet (20 mg total) by mouth daily.   methimazole  5 MG tablet Commonly known as: TAPAZOLE  Take 1 tablet (5 mg total) by mouth 3 (three) times daily.   omeprazole  40 MG capsule Commonly known as: PRILOSEC Take 1 capsule (40 mg total) by mouth daily.   Ozempic  (0.25 or 0.5 MG/DOSE) 2 MG/3ML Sopn Generic drug: Semaglutide (0.25 or 0.5MG /DOS) Inject 0.25 mg into the skin once a week.         OBJECTIVE:   Vital Signs: There were Thomas [name] vitals taken for this visit.  Wt Readings from Last 3 Encounters:  01/27/24 238 lb 4.8 oz (108.1 kg)  11/06/23 221 lb (100.2 kg)  09/23/23 220 lb 12.8 oz (100.2 kg)     Exam: General: Pt appears well and is in NAD  Lungs: Clear with good BS bilat   Heart: RRR   Extremities: Thomas [name] pretibial edema.   Neuro: MS is good with appropriate affect, pt is alert and Ox3   DM Foot exam 06/26/2023 per podiatry      DATA REVIEWED:  Lab Results  Component Value Date   HGBA1C 7.1 (A) 11/06/2023   HGBA1C 6.9 (A) 08/06/2023   HGBA1C 6.5 (A) 03/04/2022    Latest Reference Range & Units 08/14/23 05:51  Sodium 135 - 145 mmol/L 140  Potassium 3.5 - 5.1 mmol/L 4.0  Chloride 98 - 111 mmol/L 106  Glucose 70 - 99 mg/dL 899 (H)  BUN 6 - 20 mg/dL 21 (H)  Creatinine 9.38 - 1.24 mg/dL 8.89  Calcium  Ionized 1.15 - 1.40 mmol/L 1.29     Labs 07/09/2023  TSH <0.05 uIU/mL Free T4 2.3 NG/DL TSI 7.67 (9.99-9.44) TPO antibodies 1    ASSESSMENT / PLAN / RECOMMENDATIONS:   1) Type 2  Diabetes Mellitus, with improving glycemic control , With neuropathic, retinopathic and macrovascular complications as well as microalbuminuria- Most recent A1c of 7.1%. Goal A1c <7.0%.     -A1c is trending up -Patient has been eating more up to 4 meals a day -I have recommended GLP-1 agonist, historically I been trying to avoid this due to severe GERD, but I have suggested starting a small dose and see how his GERD symptoms - A new prescription for Dexcom was sent and a note to our prior authorization team has been sent  MEDICATIONS: Lantus  32 units daily  Continue Humalog  8-10 units with each meal  Continue  Jardiance  25 mg daily Start Ozempic  0.25 mg weekly   EDUCATION / INSTRUCTIONS: BG monitoring instructions: Patient is instructed to check his blood sugars 2 times daily before meals times a day. Call [place] [facility] if: BG persistently < 70  I reviewed the Rule of 15 for the treatment of hypoglycemia in detail with the patient. Literature supplied.   2) Diabetic complications:  Eye: Does not have known diabetic retinopathy.  Neuro/ Feet: Does  have known diabetic peripheral neuropathy .  Renal: Patient does not have known baseline CKD. He   is  on an ACEI/ARB at present.     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 [place] [place], [place] [address] Phone: [phone] FAX: [phone]   CC: Thomas Thomas [name] [address] [place]  [address] Phone: [phone]  Fax: [phone]  Return to Endocrinology clinic as below: Future Appointments  Date Time Provider Department Center  02/03/2024 11:30 AM Avalene Sealy, Donell Redgie, MD [id number] None

## 2024-02-03 ENCOUNTER — Other Ambulatory Visit

## 2024-02-03 ENCOUNTER — Ambulatory Visit: Admitting: Internal Medicine

## 2024-02-03 ENCOUNTER — Encounter: Payer: Self-pay | Admitting: Internal Medicine

## 2024-02-03 VITALS — BP 136/84 | HR 99 | Ht 68.0 in | Wt 240.0 lb

## 2024-02-03 DIAGNOSIS — E0590 Thyrotoxicosis, unspecified without thyrotoxic crisis or storm: Secondary | ICD-10-CM

## 2024-02-03 DIAGNOSIS — Z794 Long term (current) use of insulin: Secondary | ICD-10-CM

## 2024-02-03 DIAGNOSIS — E1151 Type 2 diabetes mellitus with diabetic peripheral angiopathy without gangrene: Secondary | ICD-10-CM

## 2024-02-03 DIAGNOSIS — E0500 Thyrotoxicosis with diffuse goiter without thyrotoxic crisis or storm: Secondary | ICD-10-CM

## 2024-02-03 DIAGNOSIS — K219 Gastro-esophageal reflux disease without esophagitis: Secondary | ICD-10-CM

## 2024-02-03 DIAGNOSIS — E1129 Type 2 diabetes mellitus with other diabetic kidney complication: Secondary | ICD-10-CM

## 2024-02-03 DIAGNOSIS — R809 Proteinuria, unspecified: Secondary | ICD-10-CM

## 2024-02-03 LAB — POCT GLYCOSYLATED HEMOGLOBIN (HGB A1C): Hemoglobin A1C: 6.8 % — AB (ref 4.0–5.6)

## 2024-02-03 LAB — POCT GLUCOSE (DEVICE FOR HOME USE): Glucose Fasting, POC: 91 mg/dL (ref 70–99)

## 2024-02-03 NOTE — Patient Instructions (Addendum)
-   Decrease  Lantus  28 units daily  - Decrease Humalog  6 units with meals  - Continue  Jardiance  25 mg daily  - Increase Ozempic  0.50 mg weekly      HOW TO TREAT LOW BLOOD SUGARS (Blood sugar LESS THAN 70 MG/DL) Please follow the RULE OF 15 for the treatment of hypoglycemia treatment (when your (blood sugars are less than 70 mg/dL)   STEP 1: Take 15 grams of carbohydrates when your blood sugar is low, which includes:  3-4 GLUCOSE TABS  OR 3-4 OZ OF JUICE OR REGULAR SODA OR ONE TUBE OF GLUCOSE GEL    STEP 2: RECHECK blood sugar in 15 MINUTES STEP 3: If your blood sugar is still low at the 15 minute recheck --> then, go back to STEP 1 and treat AGAIN with another 15 grams of carbohydrates.

## 2024-02-04 LAB — MICROALBUMIN / CREATININE URINE RATIO
Creatinine, Urine: 86 mg/dL (ref 20–320)
Microalb Creat Ratio: 1110 mg/g{creat} — ABNORMAL HIGH (ref <30)
Microalb, Ur: 95.5 mg/dL

## 2024-02-04 LAB — T4, FREE: Free T4: 1 ng/dL (ref 0.8–1.8)

## 2024-02-04 LAB — TSH: TSH: 0.06 m[IU]/L — ABNORMAL LOW (ref 0.40–4.50)

## 2024-02-05 ENCOUNTER — Ambulatory Visit: Payer: Self-pay | Admitting: Internal Medicine

## 2024-02-05 NOTE — Telephone Encounter (Signed)
 Please contact the patient through the interpreter line and let him know that his thyroid  function is improving, and to continue to take methimazole  2 in the morning and 1 in the evening    He continues to leak extra protein in the urine, this is due to diabetes, I would like for him to double up on lisinopril  from 20 mg to 40 mg,    He may take 2 of the lisinopril  20 mg that he has now at home until finished, a new prescription of lisinopril  40 mg will be sent to the pharmacy     Thanks

## 2024-02-07 ENCOUNTER — Other Ambulatory Visit: Payer: Self-pay | Admitting: Medical

## 2024-02-09 ENCOUNTER — Other Ambulatory Visit: Payer: Self-pay | Admitting: Internal Medicine

## 2024-02-09 MED ORDER — LISINOPRIL 40 MG PO TABS
40.0000 mg | ORAL_TABLET | Freq: Every day | ORAL | 3 refills | Status: AC
Start: 2024-02-09 — End: ?

## 2024-02-18 ENCOUNTER — Other Ambulatory Visit: Payer: Self-pay | Admitting: Internal Medicine

## 2024-03-09 ENCOUNTER — Other Ambulatory Visit: Payer: Self-pay | Admitting: Medical

## 2024-03-14 ENCOUNTER — Other Ambulatory Visit: Payer: Self-pay | Admitting: Medical

## 2024-03-16 ENCOUNTER — Other Ambulatory Visit: Payer: Self-pay | Admitting: Medical

## 2024-03-16 NOTE — Telephone Encounter (Signed)
 Called pt and notified him that he needs to be seen in order to receive further medication. Was able to get him scheduled for 03/19/24

## 2024-03-16 NOTE — Telephone Encounter (Signed)
 Pt came in stating he needed all of his medications refilled. The one he brought in looks like it got send in today for atorvastatin . Please call pt with any questions.

## 2024-03-18 ENCOUNTER — Other Ambulatory Visit: Payer: Self-pay

## 2024-03-18 MED ORDER — LANTUS SOLOSTAR 100 UNIT/ML ~~LOC~~ SOPN: 32.0000 [IU] | PEN_INJECTOR | Freq: Every day | SUBCUTANEOUS | 3 refills | Status: AC

## 2024-03-19 ENCOUNTER — Other Ambulatory Visit: Payer: Self-pay

## 2024-03-19 ENCOUNTER — Ambulatory Visit: Admitting: Medical

## 2024-03-19 ENCOUNTER — Encounter: Payer: Self-pay | Admitting: Medical

## 2024-03-19 ENCOUNTER — Telehealth: Payer: Self-pay

## 2024-03-19 VITALS — BP 137/85 | HR 100 | Temp 98.0°F | Resp 16 | Ht 68.0 in | Wt 241.0 lb

## 2024-03-19 DIAGNOSIS — E109 Type 1 diabetes mellitus without complications: Secondary | ICD-10-CM | POA: Diagnosis not present

## 2024-03-19 DIAGNOSIS — K219 Gastro-esophageal reflux disease without esophagitis: Secondary | ICD-10-CM | POA: Diagnosis not present

## 2024-03-19 DIAGNOSIS — E785 Hyperlipidemia, unspecified: Secondary | ICD-10-CM | POA: Diagnosis not present

## 2024-03-19 DIAGNOSIS — Z23 Encounter for immunization: Secondary | ICD-10-CM

## 2024-03-19 DIAGNOSIS — E1065 Type 1 diabetes mellitus with hyperglycemia: Secondary | ICD-10-CM | POA: Diagnosis not present

## 2024-03-19 DIAGNOSIS — I739 Peripheral vascular disease, unspecified: Secondary | ICD-10-CM | POA: Diagnosis not present

## 2024-03-19 DIAGNOSIS — E1169 Type 2 diabetes mellitus with other specified complication: Secondary | ICD-10-CM

## 2024-03-19 DIAGNOSIS — I10 Essential (primary) hypertension: Secondary | ICD-10-CM | POA: Diagnosis not present

## 2024-03-19 DIAGNOSIS — E0590 Thyrotoxicosis, unspecified without thyrotoxic crisis or storm: Secondary | ICD-10-CM

## 2024-03-19 LAB — LIPID PANEL
Cholesterol: 127 mg/dL (ref 0–200)
HDL: 40.7 mg/dL (ref >39.00)
LDL Cholesterol: 69 mg/dL (ref 0–99)
NonHDL: 86.34
Total CHOL/HDL Ratio: 3
Triglycerides: 88 mg/dL (ref 0.0–149.0)
VLDL: 17.6 mg/dL (ref 0.0–40.0)

## 2024-03-19 LAB — COMPREHENSIVE METABOLIC PANEL WITH GFR
ALT: 16 U/L (ref 0–53)
AST: 15 U/L (ref 0–37)
Albumin: 4.3 g/dL (ref 3.5–5.2)
Alkaline Phosphatase: 122 U/L — ABNORMAL HIGH (ref 39–117)
BUN: 12 mg/dL (ref 6–23)
CO2: 25 meq/L (ref 19–32)
Calcium: 9.1 mg/dL (ref 8.4–10.5)
Chloride: 105 meq/L (ref 96–112)
Creatinine, Ser: 1.03 mg/dL (ref 0.40–1.50)
GFR: 86.58 mL/min (ref >60.00)
Glucose, Bld: 98 mg/dL (ref 70–99)
Potassium: 4.1 meq/L (ref 3.5–5.1)
Sodium: 137 meq/L (ref 135–145)
Total Bilirubin: 0.5 mg/dL (ref 0.2–1.2)
Total Protein: 7.3 g/dL (ref 6.0–8.3)

## 2024-03-19 MED ORDER — ATORVASTATIN CALCIUM 20 MG PO TABS: 20.0000 mg | ORAL_TABLET | Freq: Every day | ORAL | 3 refills | Status: AC

## 2024-03-19 NOTE — Telephone Encounter (Signed)
 Copied from CRM [id number]. Topic: Clinical - Medical Advice >> Mar 19, 2024  1:29 PM Brittany M wrote: Reason for CRM: Patient calling back to give information for [facility] [address] [place]- [address] [place].

## 2024-03-19 NOTE — Patient Instructions (Signed)
 Type 1 diabetes mellitus. Diabetes management ongoing . Adequate control recently. - Continue current diabetes management plan. - Monitor blood pressure at home 3-4 times a week and report if consistently over 130/80 mmHg.  Peripheral artery disease Mild blockage in right leg. Surgery scheduled. No claudication or fatigue symptoms. - Continue Plavix .  Essential hypertension Blood pressure 137/85 mmHg. Target closer to 130/80 mmHg due to diabetes. - Continue lisinopril  40 mg daily. - Monitor blood pressure at home 3-4 times a week and report if consistently over 130/80 mmHg.  Gastroesophageal reflux disease GERD well-controlled with omeprazole . - Continue omeprazole  40 mg daily.  Hyperthyroidism Managed with methimazole . Dosage recently increased. Endocrinologist's prescription duration unclear. - Sent message to endocrinologist to clarify methimazole  prescription duration.  Hyperlipidemia Cholesterol medication requires refill. Recent labs unavailable. - Refilled cholesterol medication for 90 days. - Ordered lipid panel and metabolic panel.  Chronic osteomyelitis Improved blood flow may aid wound healing. - Continue follow-up with podiatrist. -upcoming vascular surgery  Cerebral small vessel disease MRI shows small lacunar infarcts likely related to age, diabetes, and hyperlipidemia. - Continue management of diabetes and hyperlipidemia. -continue plavix .  General health maintenance Declined flu and pneumonia vaccines. Agreed to TDAP. Declined colonoscopy and stool test for colon cancer screening. - Administered TDAP vaccine. - Discussed colon cancer screening options, declined.  Follow up 6 month or sooner if needed

## 2024-03-19 NOTE — Progress Notes (Signed)
 Subjective:    Patient ID: Thomas [name], male    DOB: 04/16/1975, 48 y.o.   MRN: [id number]  HPI  Thomas [name] is a 48 year old male with hypertension, diabetes, and peripheral artery disease who presents for a follow-up visit.  His blood pressure today is 137/85 mmHg. He checks his blood pressure at home three to four times per week and takes lisinopril  40 mg daily.  He has diabetes with no current symptoms and is overdue for a diabetic eye exam. He is on 4 med regimen jardiace, ozempic , lantus  and humalog . Followed by Dr. Sam.   He has overactive thyroid  treated with methimazole . He was initially told to use it for two months but has now been taking it for over a year, three pills daily (two in the morning and one at night). Dr [facility] endocrinologist managing this condition.  I did find last endocrine note and review. Oct note states pt should continue methimazole . He has follow up in feb as that note state follow up in 3 months.  He has peripheral artery disease and is taking Plavix . He is scheduled for surgery in January to improve blood flow in his right leg. He denies fatigue and is able to walk without limitation.  He has chronic osteomyelitis rt foot and continues to follow with a podiatrist.  He takes omeprazole  40 mg for heartburn, which is well controlled.  He takes cholesterol medication.  He needs refill.   He has not received flu or pneumonia vaccines and declined them. He works as a curator. Willing to get tdap.   He has not had a colonoscopy despite prior referral for screening. He denies family history of colon cancer, though his family history is limited due to relatives passing during the war. Pt declines both colonoscopy and coloquard testing.         Phq-9 score 9. No thoughts of harm. Gad-7 0. Pt told MA he does not want to     Review of Systems  Constitutional:  Negative for chills, fatigue and fever.  HENT:  Negative for congestion  and ear pain.   Respiratory:  Negative for cough, chest tightness, shortness of breath and wheezing.   Cardiovascular:  Negative for chest pain and palpitations.  Gastrointestinal:  Negative for abdominal pain, blood in stool, constipation, diarrhea and vomiting.  Genitourinary:  Negative for dysuria and frequency.  Musculoskeletal:  Negative for back pain, myalgias and neck pain.  Skin:  Negative for rash.  Neurological:  Negative for dizziness, syncope and light-headedness.  Hematological:  Negative for adenopathy. Does not bruise/bleed easily.  Psychiatric/Behavioral:  Negative for behavioral problems and decreased concentration. The patient is not nervous/anxious.     Past Medical History:  Diagnosis Date   Depression 01/27/2015   Diabetes mellitus without complication (HCC)    Hyperlipidemia    Hypertension    Peripheral arterial disease    Peripheral vascular disease      Social History   Socioeconomic History   Marital status: Single    Spouse name: Not on file   Number of children: Not on file   Years of education: Not on file   Highest education level: Not on file  Occupational History   Occupation: Unemployed  Tobacco Use   Smoking status: Former    Current packs/day: 0.00    Types: Cigarettes    Quit date: 01/13/2022    Years since quitting: 2.1    Passive exposure: Never   Smokeless tobacco: Former  Tobacco comments:    2 cigarrettes a day  Vaping Use   Vaping status: Never Used  Substance and Sexual Activity   Alcohol use: No    Alcohol/week: 0.0 standard drinks of alcohol   Drug use: No   Sexual activity: Not on file  Other Topics Concern   Not on file  Social History Narrative   Are you right handed or left handed? Right Handed    Are you currently employed ? No work    What is your current occupation?   Do you live at home alone? Yes   Who lives with you? No   What type of home do you live in: 1 story or 2 story? One story home       Social  Drivers of Health   Financial Resource Strain: Not on file  Food Insecurity: Not on file  Transportation Needs: Not on file  Physical Activity: Not on file  Stress: Not on file  Social Connections: Not on file  Intimate Partner Violence: Not on file    Past Surgical History:  Procedure Laterality Date   ABDOMINAL AORTOGRAM N/A 08/14/2023   Procedure: ABDOMINAL AORTOGRAM;  Surgeon: Gretta Lonni PARAS, MD;  Location: [facility] INVASIVE CV LAB;  Service: Cardiovascular;  Laterality: N/A;   ABDOMINAL AORTOGRAM W/LOWER EXTREMITY Right 04/19/2019   Procedure: ABDOMINAL AORTOGRAM W/LOWER EXTREMITY;  Surgeon: Gretta Lonni PARAS, MD;  Location: [facility] INVASIVE CV LAB;  Service: Cardiovascular;  Laterality: Right;   ABDOMINAL AORTOGRAM W/LOWER EXTREMITY Right 09/16/2019   Procedure: ABDOMINAL AORTOGRAM W/LOWER EXTREMITY;  Surgeon: Gretta Lonni PARAS, MD;  Location: [facility] INVASIVE CV LAB;  Service: Cardiovascular;  Laterality: Right;   ABDOMINAL AORTOGRAM W/LOWER EXTREMITY N/A 08/17/2020   Procedure: ABDOMINAL AORTOGRAM W/LOWER EXTREMITY;  Surgeon: Gretta Lonni PARAS, MD;  Location: [facility] INVASIVE CV LAB;  Service: Cardiovascular;  Laterality: N/A;   ABDOMINAL AORTOGRAM W/LOWER EXTREMITY N/A 12/27/2021   Procedure: ABDOMINAL AORTOGRAM W/LOWER EXTREMITY;  Surgeon: Gretta Lonni PARAS, MD;  Location: [facility] INVASIVE CV LAB;  Service: Cardiovascular;  Laterality: N/A;   FEMORAL-TIBIAL BYPASS GRAFT Right 04/22/2019   Procedure: Right  FEMORAL-TIBIAL ARTERY BYPASS GRAFT with Harvest of Saphenous vein RIGHT SUPERFICIAL FEMORAL ARTERY TO TIBIOPERONEAL TRUNK BYPASS;  Surgeon: Gretta Lonni PARAS, MD;  Location: [facility] OR;  Service: Vascular;  Laterality: Right;   IR CERVICAL/THORACIC DISC ASPIRATION W/IMAG GUIDE  04/15/2019   IR FLUORO GUIDED NEEDLE PLC ASPIRATION/INJECTION LOC  04/15/2019   kidney stones  2009   LOWER EXTREMITY ANGIOGRAPHY N/A 08/14/2023   Procedure: Lower Extremity Angiography;  Surgeon: Gretta Lonni PARAS, MD;   Location: [facility] INVASIVE CV LAB;  Service: Cardiovascular;  Laterality: N/A;   LOWER EXTREMITY INTERVENTION N/A 08/14/2023   Procedure: LOWER EXTREMITY INTERVENTION;  Surgeon: Gretta Lonni PARAS, MD;  Location: [facility] INVASIVE CV LAB;  Service: Cardiovascular;  Laterality: N/A;   PERIPHERAL VASCULAR BALLOON ANGIOPLASTY Right 09/16/2019   Procedure: PERIPHERAL VASCULAR BALLOON ANGIOPLASTY;  Surgeon: Gretta Lonni PARAS, MD;  Location: [facility] INVASIVE CV LAB;  Service: Cardiovascular;  Laterality: Right;  ext iliac DCB   PERIPHERAL VASCULAR BALLOON ANGIOPLASTY  08/17/2020   Procedure: PERIPHERAL VASCULAR BALLOON ANGIOPLASTY;  Surgeon: Gretta Lonni PARAS, MD;  Location: [facility] INVASIVE CV LAB;  Service: Cardiovascular;;  Right TP trunk   PERIPHERAL VASCULAR BALLOON ANGIOPLASTY Left 08/24/2020   Procedure: PERIPHERAL VASCULAR BALLOON ANGIOPLASTY;  Surgeon: Gretta Lonni PARAS, MD;  Location: [facility] INVASIVE CV LAB;  Service: Cardiovascular;  Laterality: Left;  Tibioperoneal trunk and superficial femoral arteries.   PERIPHERAL  VASCULAR BALLOON ANGIOPLASTY  12/27/2021   Procedure: PERIPHERAL VASCULAR BALLOON ANGIOPLASTY;  Surgeon: Gretta Lonni PARAS, MD;  Location: [facility] INVASIVE CV LAB;  Service: Cardiovascular;;   PERIPHERAL VASCULAR INTERVENTION  04/19/2019   Procedure: PERIPHERAL VASCULAR INTERVENTION;  Surgeon: Gretta Lonni PARAS, MD;  Location: [facility] INVASIVE CV LAB;  Service: Cardiovascular;;  right external iliac   PERIPHERAL VASCULAR INTERVENTION  12/27/2021   Procedure: PERIPHERAL VASCULAR INTERVENTION;  Surgeon: Gretta Lonni PARAS, MD;  Location: [facility] INVASIVE CV LAB;  Service: Cardiovascular;;   TEE WITHOUT CARDIOVERSION N/A 04/19/2019   Procedure: TRANSESOPHAGEAL ECHOCARDIOGRAM (TEE);  Surgeon: Kate Lonni CROME, MD;  Location: [facility] ENDOSCOPY;  Service: Endoscopy;  Laterality: N/A;    Family History  Problem Relation Age of Onset   Colon cancer Neg Hx    Esophageal cancer Neg Hx    Pancreatic cancer Neg Hx     Liver cancer Neg Hx    Stomach cancer Neg Hx     Allergies  Allergen Reactions   Porcine (Pork) Protein-Containing Drug Products Other (See Comments)    Does not eat    Current Outpatient Medications on File Prior to Visit  Medication Sig Dispense Refill   acetaminophen  (TYLENOL ) 500 MG tablet Take 500 mg by mouth every 8 (eight) hours as needed for mild pain (pain score 1-3) or moderate pain (pain score 4-6).     aspirin  EC 81 MG tablet Take 81 mg by mouth daily. Swallow whole.     atorvastatin  (LIPITOR) 20 MG tablet Take 1 tablet (20 mg total) by mouth daily. 15 tablet 0   clopidogrel  (PLAVIX ) 75 MG tablet Take 1 tablet (75 mg total) by mouth daily with breakfast. Needs appt 30 tablet 0   Continuous Glucose Sensor (DEXCOM G7 SENSOR) MISC 1 Device by Does not apply route as directed. 3 each 11   empagliflozin  (JARDIANCE ) 25 MG TABS tablet Take 1 tablet (25 mg total) by mouth daily before breakfast. 90 tablet 3   insulin  lispro (HUMALOG  KWIKPEN) 100 UNIT/ML KwikPen Max daily 45 units 45 mL 3   LANTUS  SOLOSTAR 100 UNIT/ML Solostar Pen Inject 32 Units into the skin daily. INJECT 32 UNITS SUBCUTANEOUSLY ONCE DAILY 30 mL 3   lisinopril  (ZESTRIL ) 40 MG tablet Take 1 tablet (40 mg total) by mouth daily. 90 tablet 3   methimazole  (TAPAZOLE ) 5 MG tablet Take 1 tablet (5 mg total) by mouth 3 (three) times daily. 270 tablet 3   omeprazole  (PRILOSEC) 40 MG capsule Take 1 capsule (40 mg total) by mouth daily. 90 capsule 3   Semaglutide ,0.25 or 0.5MG /DOS, (OZEMPIC , 0.25 OR 0.5 MG/DOSE,) 2 MG/3ML SOPN Inject 0.25 mg into the skin once a week. 9 mL 3   [facility] MAGNESIUM  PO Take 1 capsule by mouth daily at 12 noon. Magnesium  citrate 250 (Patient not taking: Reported on 03/19/2024)     cyanocobalamin (VITAMIN B12) 1000 MCG tablet Take 1,000 mcg by mouth daily. (Patient not taking: Reported on 03/19/2024)     No current facility-administered medications on file prior to visit.    BP 137/85   Pulse 100   Temp  98 F (36.7 C) (Oral)   Resp 16   Ht 5' 8 (1.727 m)   Wt 241 lb (109.3 kg)   SpO2 98%   BMI 36.64 kg/m        Objective:   Physical Exam  General Mental Status- Alert. General Appearance- Not in acute distress.   Skin General: Color- Normal Color. Moisture- Normal Moisture.  Neck Carotid  Arteries- Normal color. Moisture- Normal Moisture. No carotid bruits. No JVD.  Chest and Lung Exam Auscultation: Breath Sounds:-CTA  Cardiovascular Auscultation:Rythm- RRR Murmurs & Other Heart Sounds:Auscultation of the heart reveals- No Murmurs.  Abdomen Inspection:-Inspeection Normal. Palpation/Percussion:Note:No mass. Palpation and Percussion of the abdomen reveal- Non Tender, Non Distended + BS, no rebound or guarding.   Neurologic Cranial Nerve exam:- CN III-XII intact(No nystagmus), symmetric smile. Strength:- 5/5 equal and symmetric strength both upper and lower extremities.       Assessment & Plan:   Patient Instructions  Type 1 diabetes mellitus. Diabetes management ongoing . Adequate control recently. - Continue current diabetes management plan. - Monitor blood pressure at home 3-4 times a week and report if consistently over 130/80 mmHg.  Peripheral artery disease Mild blockage in right leg. Surgery scheduled. No claudication or fatigue symptoms. - Continue Plavix .  Essential hypertension Blood pressure 137/85 mmHg. Target closer to 130/80 mmHg due to diabetes. - Continue lisinopril  40 mg daily. - Monitor blood pressure at home 3-4 times a week and report if consistently over 130/80 mmHg.  Gastroesophageal reflux disease GERD well-controlled with omeprazole . - Continue omeprazole  40 mg daily.  Hyperthyroidism Managed with methimazole . Dosage recently increased. Endocrinologist's prescription duration unclear. - Sent message to endocrinologist to clarify methimazole  prescription duration.  Hyperlipidemia Cholesterol medication requires refill. Recent  labs unavailable. - Refilled cholesterol medication for 90 days. - Ordered lipid panel and metabolic panel.  Chronic osteomyelitis Improved blood flow may aid wound healing. - Continue follow-up with podiatrist. -upcoming vascular surgery  Cerebral small vessel disease MRI shows small lacunar infarcts likely related to age, diabetes, and hyperlipidemia. - Continue management of diabetes and hyperlipidemia. -continue plavix .  General health maintenance Declined flu and pneumonia vaccines. Agreed to TDAP. Declined colonoscopy and stool test for colon cancer screening. - Administered TDAP vaccine. - Discussed colon cancer screening options, declined.  Follow up 6 month or sooner if needed   [employer], PA-C     I personally spent a total of 45 minutes in the care of the patient today including performing a medically appropriate exam/evaluation, counseling and educating, placing orders, and documenting clinical information in the EHR.

## 2024-03-19 NOTE — Telephone Encounter (Signed)
 Fax for records request sent over

## 2024-03-20 ENCOUNTER — Ambulatory Visit: Payer: Self-pay | Admitting: Medical

## 2024-04-02 ENCOUNTER — Other Ambulatory Visit: Payer: Self-pay | Admitting: Internal Medicine

## 2024-04-02 NOTE — Telephone Encounter (Signed)
 Requested Prescriptions   Pending Prescriptions Disp Refills   JARDIANCE 25 MG TABS tablet [Pharmacy Med Name: Jardiance 25 MG Oral Tablet] 90 tablet 0    Sig: TAKE 1 TABLET BY MOUTH ONCE DAILY BEFORE BREAKFAST

## 2024-04-09 ENCOUNTER — Other Ambulatory Visit (HOSPITAL_COMMUNITY): Payer: Self-pay

## 2024-04-09 ENCOUNTER — Telehealth: Payer: Self-pay | Admitting: Pharmacy Technician

## 2024-04-09 NOTE — Telephone Encounter (Signed)
 Pharmacy Patient Advocate Encounter  Received notification from [employer] that Prior Authorization for Dexcom G7 Sensor  has been DENIED.  Full denial letter will be uploaded to the media tab. See denial reason below.     PA #/Case ID/Reference #: [id number]

## 2024-04-09 NOTE — Telephone Encounter (Signed)
 Thomas [name]

## 2024-04-09 NOTE — Telephone Encounter (Signed)
 Pharmacy Patient Advocate Encounter   Received notification from Onbase that prior authorization for Dexcom G7 Sensor  is required/requested.   Insurance verification completed.   The patient is insured through [employer].   Per test claim: PA required; PA submitted to above mentioned insurance via Latent Key/confirmation #/EOC [id number] Status is pending

## 2024-04-13 ENCOUNTER — Other Ambulatory Visit: Payer: Self-pay | Admitting: Medical

## 2024-04-22 ENCOUNTER — Ambulatory Visit (HOSPITAL_COMMUNITY)
Admission: RE | Admit: 2024-04-22 | Discharge: 2024-04-22 | Disposition: A | Discharge status: Home / Self Care | Attending: Vascular Surgery | Admitting: Vascular Surgery

## 2024-04-22 ENCOUNTER — Encounter (HOSPITAL_COMMUNITY)
Admission: RE | Disposition: A | Discharge status: Home / Self Care | Payer: Self-pay | Source: Home / Self Care | Attending: Vascular Surgery

## 2024-04-22 ENCOUNTER — Other Ambulatory Visit: Payer: Self-pay

## 2024-04-22 DIAGNOSIS — Z79899 Other long term (current) drug therapy: Secondary | ICD-10-CM | POA: Insufficient documentation

## 2024-04-22 DIAGNOSIS — I70213 Atherosclerosis of native arteries of extremities with intermittent claudication, bilateral legs: Secondary | ICD-10-CM

## 2024-04-22 DIAGNOSIS — Z7902 Long term (current) use of antithrombotics/antiplatelets: Secondary | ICD-10-CM | POA: Diagnosis not present

## 2024-04-22 DIAGNOSIS — Z7982 Long term (current) use of aspirin: Secondary | ICD-10-CM | POA: Diagnosis not present

## 2024-04-22 DIAGNOSIS — Z87891 Personal history of nicotine dependence: Secondary | ICD-10-CM | POA: Insufficient documentation

## 2024-04-22 DIAGNOSIS — Z7985 Long-term (current) use of injectable non-insulin antidiabetic drugs: Secondary | ICD-10-CM | POA: Insufficient documentation

## 2024-04-22 DIAGNOSIS — Z794 Long term (current) use of insulin: Secondary | ICD-10-CM | POA: Diagnosis not present

## 2024-04-22 DIAGNOSIS — E1151 Type 2 diabetes mellitus with diabetic peripheral angiopathy without gangrene: Secondary | ICD-10-CM | POA: Diagnosis not present

## 2024-04-22 DIAGNOSIS — I70211 Atherosclerosis of native arteries of extremities with intermittent claudication, right leg: Secondary | ICD-10-CM | POA: Insufficient documentation

## 2024-04-22 DIAGNOSIS — Z7984 Long term (current) use of oral hypoglycemic drugs: Secondary | ICD-10-CM | POA: Insufficient documentation

## 2024-04-22 DIAGNOSIS — T82856A Stenosis of peripheral vascular stent, initial encounter: Secondary | ICD-10-CM | POA: Insufficient documentation

## 2024-04-22 DIAGNOSIS — Y832 Surgical operation with anastomosis, bypass or graft as the cause of abnormal reaction of the patient, or of later complication, without mention of misadventure at the time of the procedure: Secondary | ICD-10-CM | POA: Insufficient documentation

## 2024-04-22 HISTORY — PX: LOWER EXTREMITY ANGIOGRAPHY: CATH118251

## 2024-04-22 HISTORY — PX: ABDOMINAL AORTOGRAM: CATH118222

## 2024-04-22 LAB — POCT I-STAT, CHEM 8
BUN: 12 mg/dL (ref 6–20)
Calcium, Ion: 1.25 mmol/L (ref 1.15–1.40)
Chloride: 102 mmol/L (ref 98–111)
Creatinine, Ser: 1.1 mg/dL (ref 0.61–1.24)
Glucose, Bld: 107 mg/dL — ABNORMAL HIGH (ref 70–99)
HCT: 46 % (ref 39.0–52.0)
Hemoglobin: 15.6 g/dL (ref 13.0–17.0)
Potassium: 4 mmol/L (ref 3.5–5.1)
Sodium: 142 mmol/L (ref 135–145)
TCO2: 24 mmol/L (ref 22–32)

## 2024-04-22 LAB — POCT ACTIVATED CLOTTING TIME: Activated Clotting Time: 148 s

## 2024-04-22 SURGERY — ABDOMINAL AORTOGRAM
Anesthesia: LOCAL

## 2024-04-22 MED ORDER — LIDOCAINE HCL (PF) 1 % IJ SOLN
INTRAMUSCULAR | Status: AC
  Filled 2024-04-22: qty 30

## 2024-04-22 MED ORDER — MIDAZOLAM HCL 2 MG/2ML IJ SOLN
INTRAMUSCULAR | Status: AC
  Filled 2024-04-22: qty 2

## 2024-04-22 MED ORDER — ACETAMINOPHEN 325 MG PO TABS: 650.0000 mg | ORAL_TABLET | ORAL | Status: DC | PRN

## 2024-04-22 MED ORDER — MIDAZOLAM HCL (PF) 2 MG/2ML IJ SOLN
INTRAMUSCULAR | Status: DC | PRN
  Administered 2024-04-22: 1 mg via INTRAVENOUS

## 2024-04-22 MED ORDER — SODIUM CHLORIDE 0.9% FLUSH: 3.0000 mL | INTRAVENOUS | Status: DC | PRN

## 2024-04-22 MED ORDER — SODIUM CHLORIDE 0.9% FLUSH: 3.0000 mL | Freq: Two times a day (BID) | INTRAVENOUS | Status: DC

## 2024-04-22 MED ORDER — ONDANSETRON HCL 4 MG/2ML IJ SOLN: 4.0000 mg | Freq: Four times a day (QID) | INTRAMUSCULAR | Status: DC | PRN

## 2024-04-22 MED ORDER — BIVALIRUDIN TRIFLUOROACETATE 250 MG IV SOLR
INTRAVENOUS | Status: AC
  Filled 2024-04-22: qty 250

## 2024-04-22 MED ORDER — ASPIRIN 81 MG PO CHEW
CHEWABLE_TABLET | ORAL | Status: DC | PRN
  Administered 2024-04-22: 81 mg via ORAL

## 2024-04-22 MED ORDER — IODIXANOL 320 MG/ML IV SOLN
INTRAVENOUS | Status: DC | PRN
  Administered 2024-04-22: 70 mL

## 2024-04-22 MED ORDER — LABETALOL HCL 5 MG/ML IV SOLN: 10.0000 mg | INTRAVENOUS | Status: DC | PRN

## 2024-04-22 MED ORDER — SODIUM CHLORIDE 0.9 % IV SOLN: INTRAVENOUS | Status: AC

## 2024-04-22 MED ORDER — SODIUM CHLORIDE 0.9 % IV SOLN
INTRAVENOUS | Status: DC | PRN
  Administered 2024-04-22: 1.754 mg/kg/h via INTRAVENOUS

## 2024-04-22 MED ORDER — BIVALIRUDIN BOLUS VIA INFUSION - CUPID
INTRAVENOUS | Status: DC | PRN
  Administered 2024-04-22: 14.5 mg via INTRAVENOUS

## 2024-04-22 MED ORDER — SODIUM CHLORIDE 0.9 % IV SOLN: INTRAVENOUS | Status: DC

## 2024-04-22 MED ORDER — SODIUM CHLORIDE 0.9 % IV SOLN: 250.0000 mL | INTRAVENOUS | Status: DC | PRN

## 2024-04-22 MED ORDER — ASPIRIN 81 MG PO CHEW
CHEWABLE_TABLET | ORAL | Status: AC
  Filled 2024-04-22: qty 1

## 2024-04-22 MED ORDER — STERILE WATER FOR INJECTION IJ SOLN
INTRAMUSCULAR | Status: AC
  Filled 2024-04-22: qty 10

## 2024-04-22 MED ORDER — ASPIRIN 81 MG PO TBEC: 81.0000 mg | DELAYED_RELEASE_TABLET | Freq: Every day | ORAL | Status: DC

## 2024-04-22 MED ORDER — SODIUM CHLORIDE 0.9 % IV SOLN
INTRAVENOUS | Status: DC | PRN
  Administered 2024-04-22: 1000 mL via INTRAVENOUS

## 2024-04-22 MED ORDER — CLOPIDOGREL BISULFATE 75 MG PO TABS
ORAL_TABLET | ORAL | Status: AC
  Filled 2024-04-22: qty 1

## 2024-04-22 MED ORDER — FENTANYL CITRATE (PF) 100 MCG/2ML IJ SOLN
INTRAMUSCULAR | Status: DC | PRN
  Administered 2024-04-22: 25 ug via INTRAVENOUS

## 2024-04-22 MED ORDER — FENTANYL CITRATE (PF) 100 MCG/2ML IJ SOLN
INTRAMUSCULAR | Status: AC
  Filled 2024-04-22: qty 2

## 2024-04-22 MED ORDER — HYDRALAZINE HCL 20 MG/ML IJ SOLN: 5.0000 mg | INTRAMUSCULAR | Status: DC | PRN

## 2024-04-22 MED ORDER — CLOPIDOGREL BISULFATE 75 MG PO TABS: 75.0000 mg | ORAL_TABLET | Freq: Every day | ORAL | Status: DC

## 2024-04-22 MED ORDER — CLOPIDOGREL BISULFATE 300 MG PO TABS
ORAL_TABLET | ORAL | Status: DC | PRN
  Administered 2024-04-22: 75 mg via ORAL

## 2024-04-22 MED ORDER — LIDOCAINE HCL (PF) 1 % IJ SOLN
INTRAMUSCULAR | Status: DC | PRN
  Administered 2024-04-22: 15 mL via INTRADERMAL

## 2024-04-22 SURGICAL SUPPLY — 14 items
CATH OMNI FLUSH 5F 65CM (CATHETERS) IMPLANT
DCB IN.PACT 5X80 (BALLOONS) IMPLANT
GLIDEWIRE ADV .035X260CM (WIRE) IMPLANT
KIT ENCORE 26 ADVANTAGE (KITS) IMPLANT
KIT SINGLE USE MANIFOLD (KITS) IMPLANT
KIT SYRINGE INJ CVI SPIKEX1 (MISCELLANEOUS) IMPLANT
PACK CARDIAC CATHETERIZATION (CUSTOM PROCEDURE TRAY) IMPLANT
SET ATX-X65L (MISCELLANEOUS) IMPLANT
SET MICROPUNCTURE 5F STIFF (MISCELLANEOUS) IMPLANT
SHEATH CATAPULT 6F 45 MP (SHEATH) IMPLANT
SHEATH PINNACLE 5F 10CM (SHEATH) IMPLANT
SHEATH PINNACLE 6F 10CM (SHEATH) IMPLANT
SHEATH PROBE COVER 6X72 (BAG) IMPLANT
WIRE BENTSON .035X145CM (WIRE) IMPLANT

## 2024-04-22 NOTE — Progress Notes (Signed)
 6Fr. Sheath in Left Femoral artery had good blood return. Instructions about vagal symptoms explained and sheath removed at 0910. Pressure held continuously until 0940. Pt had no vagal responses. No issues with sheath removal. Pt given instruction about bedrest rules and groin site complications to be aware of. Pt had translator and understood. Site is soft, non-tender, and without hematoma. Pt to transition to short stay for remainder of stay.

## 2024-04-22 NOTE — Discharge Instructions (Signed)
 Femoral Site Care This sheet gives you information about how to care for yourself after your procedure. Your health care provider may also give you more specific instructions. If you have problems or questions, contact your health care provider. What can I expect after the procedure?  After the procedure, it is common to have: Bruising that usually fades within 1-2 weeks. Tenderness at the site. Follow these instructions at home: Wound care Follow instructions from your health care provider about how to take care of your insertion site. Make sure you: Wash your hands with soap and water before you change your bandage (dressing). If soap and water are not available, use hand sanitizer. Remove your dressing as told by your health care provider. In 24 hours Do not take baths, swim, or use a hot tub until your health care provider approves. You may shower 24-48 hours after the procedure or as told by your health care provider. Gently wash the site with plain soap and water. Pat the area dry with a clean towel. Do not rub the site. This may cause bleeding. Do not apply powder or lotion to the site. Keep the site clean and dry. Check your femoral site every day for signs of infection. Check for: Redness, swelling, or pain. Fluid or blood. Warmth. Pus or a bad smell. Activity For the first 2-3 days after your procedure, or as long as directed: Avoid climbing stairs as much as possible. Do not squat. Do not lift anything that is heavier than 10 lb (4.5 kg), or the limit that you are told, until your health care provider says that it is safe. For 5 days Rest as directed. Avoid sitting for a long time without moving. Get up to take short walks every 1-2 hours. Do not drive for 24 hours if you were given a medicine to help you relax (sedative). General instructions Take over-the-counter and prescription medicines only as told by your health care provider. Keep all follow-up visits as told by  your health care provider. This is important. Contact a health care provider if you have: A fever or chills. You have redness, swelling, or pain around your insertion site. Get help right away if: The catheter insertion area swells very fast. You pass out. You suddenly start to sweat or your skin gets clammy. The catheter insertion area is bleeding, and the bleeding does not stop when you hold steady pressure on the area. The area near or just beyond the catheter insertion site becomes pale, cool, tingly, or numb. These symptoms may represent a serious problem that is an emergency. Do not wait to see if the symptoms will go away. Get medical help right away. Call your local emergency services (911 in the [place] Do not drive yourself to the hospital. Summary After the procedure, it is common to have bruising that usually fades within 1-2 weeks. Check your femoral site every day for signs of infection. Do not lift anything that is heavier than 10 lb (4.5 kg), or the limit that you are told, until your health care provider says that it is safe. This information is not intended to replace advice given to you by your health care provider. Make sure you discuss any questions you have with your health care provider. Document Revised: 04/14/2017 Document Reviewed: 04/14/2017 Elsevier Patient Education  2020 [employer].

## 2024-04-22 NOTE — H&P (Signed)
 " Office Note        CC:  follow up Requesting Provider:  Dorina Loving, PA-C   HPI: Thomas [name] is a 49 y.o. ([birth year]) male who presents for surveillance of PAD.  He is well-known to [facility] with numerous interventions in the past including right external iliac stenting on 04/19/2019.  He then underwent right above-the-knee popliteal to TP trunk bypass with vein on 04/22/2019 due to tissue loss.  In 2023 he required right SFA stenting as well as TP trunk and PT angioplasty for a threatened bypass.  He also had a left SFA, below the knee popliteal, and PT angioplasty in 2022 for claudication.  Most recently he underwent drug-coated balloon angioplasty of the in-stent stenosis of the right SFA in May of this year by Dr. Gretta.   He denies any claudication, rest pain, or tissue loss of bilateral lower extremities.  He has been taking his aspirin , Plavix , and statin daily.  He denies tobacco use.  A video interpreter was used during today's visit.         Past Medical History:  Diagnosis Date   Depression 01/27/2015   Diabetes mellitus without complication (HCC)     Hyperlipidemia     Hypertension     Peripheral arterial disease     Peripheral vascular disease                 Past Surgical History:  Procedure Laterality Date   ABDOMINAL AORTOGRAM N/A 08/14/2023    Procedure: ABDOMINAL AORTOGRAM;  Surgeon: Gretta Lonni PARAS, MD;  Location: [facility];  Service: Cardiovascular;  Laterality: N/A;   ABDOMINAL AORTOGRAM W/LOWER EXTREMITY Right 04/19/2019    Procedure: ABDOMINAL AORTOGRAM W/LOWER EXTREMITY;  Surgeon: Gretta Lonni PARAS, MD;  Location: [facility];  Service: Cardiovascular;  Laterality: Right;   ABDOMINAL AORTOGRAM W/LOWER EXTREMITY Right 09/16/2019    Procedure: ABDOMINAL AORTOGRAM W/LOWER EXTREMITY;  Surgeon: Gretta Lonni PARAS, MD;  Location: [facility];  Service: Cardiovascular;  Laterality: Right;   ABDOMINAL AORTOGRAM W/LOWER EXTREMITY N/A  08/17/2020    Procedure: ABDOMINAL AORTOGRAM W/LOWER EXTREMITY;  Surgeon: Gretta Lonni PARAS, MD;  Location: [facility];  Service: Cardiovascular;  Laterality: N/A;   ABDOMINAL AORTOGRAM W/LOWER EXTREMITY N/A 12/27/2021    Procedure: ABDOMINAL AORTOGRAM W/LOWER EXTREMITY;  Surgeon: Gretta Lonni PARAS, MD;  Location: [facility];  Service: Cardiovascular;  Laterality: N/A;   FEMORAL-TIBIAL BYPASS GRAFT Right 04/22/2019    Procedure: Right  FEMORAL-TIBIAL ARTERY BYPASS GRAFT with Harvest of Saphenous vein RIGHT SUPERFICIAL FEMORAL ARTERY TO TIBIOPERONEAL TRUNK BYPASS;  Surgeon: Gretta Lonni PARAS, MD;  Location: [facility];  Service: Vascular;  Laterality: Right;   IR CERVICAL/THORACIC DISC ASPIRATION W/IMAG GUIDE   04/15/2019   IR FLUORO GUIDED NEEDLE PLC ASPIRATION/INJECTION LOC   04/15/2019   kidney stones   2009   LOWER EXTREMITY ANGIOGRAPHY N/A 08/14/2023    Procedure: Lower Extremity Angiography;  Surgeon: Gretta Lonni PARAS, MD;  Location: [facility];  Service: Cardiovascular;  Laterality: N/A;   LOWER EXTREMITY INTERVENTION N/A 08/14/2023    Procedure: LOWER EXTREMITY INTERVENTION;  Surgeon: Gretta Lonni PARAS, MD;  Location: [facility];  Service: Cardiovascular;  Laterality: N/A;   PERIPHERAL VASCULAR BALLOON ANGIOPLASTY Right 09/16/2019    Procedure: PERIPHERAL VASCULAR BALLOON ANGIOPLASTY;  Surgeon: Gretta Lonni PARAS, MD;  Location: [facility];  Service: Cardiovascular;  Laterality: Right;  ext iliac DCB   PERIPHERAL VASCULAR BALLOON ANGIOPLASTY  08/17/2020    Procedure: PERIPHERAL VASCULAR BALLOON ANGIOPLASTY;  Surgeon: Gretta Lonni PARAS, MD;  Location: [facility];  Service: Cardiovascular;;  Right TP trunk   PERIPHERAL VASCULAR BALLOON ANGIOPLASTY Left 08/24/2020    Procedure: PERIPHERAL VASCULAR BALLOON ANGIOPLASTY;  Surgeon: Gretta Lonni PARAS, MD;  Location: [facility];  Service: Cardiovascular;  Laterality: Left;  Tibioperoneal  trunk and superficial femoral arteries.   PERIPHERAL VASCULAR BALLOON ANGIOPLASTY   12/27/2021    Procedure: PERIPHERAL VASCULAR BALLOON ANGIOPLASTY;  Surgeon: Gretta Lonni PARAS, MD;  Location: [facility];  Service: Cardiovascular;;   PERIPHERAL VASCULAR INTERVENTION   04/19/2019    Procedure: PERIPHERAL VASCULAR INTERVENTION;  Surgeon: Gretta Lonni PARAS, MD;  Location: [facility];  Service: Cardiovascular;;  right external iliac   PERIPHERAL VASCULAR INTERVENTION   12/27/2021    Procedure: PERIPHERAL VASCULAR INTERVENTION;  Surgeon: Gretta Lonni PARAS, MD;  Location: [facility];  Service: Cardiovascular;;   TEE WITHOUT CARDIOVERSION N/A 04/19/2019    Procedure: TRANSESOPHAGEAL ECHOCARDIOGRAM (TEE);  Surgeon: Kate Lonni CROME, MD;  Location: [facility];  Service: Endoscopy;  Laterality: N/A;          Social History         Socioeconomic History   Marital status: Single      Spouse name: Not on file   Number of children: Not on file   Years of education: Not on file   Highest education level: Not on file  Occupational History   Occupation: Unemployed  Tobacco Use   Smoking status: Former      Current packs/day: 0.00      Types: Cigarettes      Quit date: 01/13/2022      Years since quitting: 2.0      Passive exposure: Never   Smokeless tobacco: Former   Tobacco comments:      2 cigarrettes a day  Vaping Use   Vaping status: Never Used  Substance and Sexual Activity   Alcohol use: No      Alcohol/week: 0.0 standard drinks of alcohol   Drug use: No   Sexual activity: Not on file  Other Topics Concern   Not on file  Social History Narrative    Are you right handed or left handed? Right Handed     Are you currently employed ? No work     What is your current occupation?    Do you live at home alone? Yes    Who lives with you? No    What type of home do you live in: 1 story or 2 story? One story home         Social Drivers of Psychologist, [employer] Strain: Not on file  Food Insecurity: Not on file  Transportation Needs: Not on file  Physical Activity: Not on file  Stress: Not on file  Social Connections: Not on file  Intimate Partner Violence: Not on file           Family History  Problem Relation Age of Onset   Colon cancer Neg Hx     Esophageal cancer Neg Hx     Pancreatic cancer Neg Hx     Liver cancer Neg Hx     Stomach cancer Neg Hx                  Current Outpatient Medications  Medication Sig Dispense Refill   acetaminophen  (TYLENOL ) 500 MG tablet Take  500 mg by mouth every 8 (eight) hours as needed for mild pain (pain score 1-3) or moderate pain (pain score 4-6).       aspirin  EC 81 MG tablet Take 81 mg by mouth daily. Swallow whole.       atorvastatin  (LIPITOR) 20 MG tablet Take 1 tablet by mouth once daily 30 tablet 0   clopidogrel  (PLAVIX ) 75 MG tablet Take 1 tablet (75 mg total) by mouth daily with breakfast. 90 tablet 2   Continuous Glucose Sensor (DEXCOM G7 SENSOR) MISC 1 Device by Does not apply route as directed. 3 each 11   empagliflozin  (JARDIANCE ) 25 MG TABS tablet Take 1 tablet (25 mg total) by mouth daily before breakfast. 90 tablet 3   insulin  glargine (LANTUS  SOLOSTAR) 100 UNIT/ML Solostar Pen Inject 32 Units into the skin daily. 30 mL 3   insulin  lispro (HUMALOG  KWIKPEN) 100 UNIT/ML KwikPen Max daily 45 units 45 mL 3   lisinopril  (ZESTRIL ) 20 MG tablet Take 1 tablet (20 mg total) by mouth daily. 90 tablet 3   methimazole  (TAPAZOLE ) 5 MG tablet Take 1 tablet (5 mg total) by mouth 3 (three) times daily. 270 tablet 3   omeprazole  (PRILOSEC) 40 MG capsule Take 1 capsule (40 mg total) by mouth daily. 90 capsule 3   Semaglutide ,0.25 or 0.5MG /DOS, (OZEMPIC , 0.25 OR 0.5 MG/DOSE,) 2 MG/3ML SOPN Inject 0.25 mg into the skin once a week. 9 mL 3   [facility] MAGNESIUM  PO Take 1 capsule by mouth daily at 12 noon. Magnesium  citrate 250 (Patient not taking: Reported on 01/27/2024)        cyanocobalamin (VITAMIN B12) 1000 MCG tablet Take 1,000 mcg by mouth daily. (Patient not taking: Reported on 01/27/2024)          No current facility-administered medications for this visit.        Allergies       Allergies  Allergen Reactions   Porcine (Pork) Protein-Containing Drug Products Other (See Comments)      Does not eat          REVIEW OF SYSTEMS:  Negative unless noted in HPI [X]  denotes positive finding, [ ]  denotes negative finding Cardiac   Comments:  Chest pain or chest pressure:      Shortness of breath upon exertion:      Short of breath when lying flat:      Irregular heart rhythm:             Vascular      Pain in calf, thigh, or hip brought on by ambulation:      Pain in feet at night that wakes you up from your sleep:       Blood clot in your veins:      Leg swelling:              Pulmonary      Oxygen at home:      Productive cough:       Wheezing:              Neurologic      Sudden weakness in arms or legs:       Sudden numbness in arms or legs:       Sudden onset of difficulty speaking or slurred speech:      Temporary loss of vision in one eye:       Problems with dizziness:              Gastrointestinal  Blood in stool:       Vomited blood:              Genitourinary      Burning when urinating:       Blood in urine:             Psychiatric      Major depression:              Hematologic      Bleeding problems:      Problems with blood clotting too easily:             Skin      Rashes or ulcers:             Constitutional      Fever or chills:          PHYSICAL EXAMINATION:      Vitals:    01/27/24 0913  BP: (!) 162/99  Pulse: 88  Resp: 18  Temp: (!) 97.2 F (36.2 C)  TempSrc: Temporal  Weight: 238 lb 4.8 oz (108.1 kg)  Height: 5' 8 (1.727 m)      General:  WDWN in NAD; vital signs documented above Gait: Not observed HENT: WNL, normocephalic Pulmonary: normal non-labored breathing Cardiac: regular  HR Abdomen: soft, NT, no masses Skin: without rashes Vascular Exam/Pulses: brisk DP signals by doppler Extremities: without ischemic changes, without Gangrene , without cellulitis; without open wounds;  Musculoskeletal: no muscle wasting or atrophy       Neurologic: A&O X 3 Psychiatric:  The pt has Normal affect.     Non-Invasive Vascular Imaging:   Right external iliac artery stent patent with a 238 cm velocity mid stent   Right SFA stent has mid stent restenosis of 383 cm/s which has increased from 220 cm/s 3 months ago   Right above-the-knee popliteal to TP trunk bypass widely patent   ABI/TBIToday's ABIToday's TBIPrevious ABIPrevious TBI  +-------+-----------+-----------+------------+------------+  Right 0.96       0.65       0.95        0.48          +-------+-----------+-----------+------------+------------+  Left  0.81       0.39       0.68        0.26                ASSESSMENT/PLAN:: 49 y.o. male here for follow up for surveillance of PAD.  Most recent intervention was drug-coated balloon angioplasty of the right SFA stent in May of this year due to recurrent in-stent stenosis and threatened above-the-knee popliteal to TP trunk bypass   Subjectively, Mr. Sherrod is doing well and denies any claudication symptoms.  He is also without rest pain or tissue loss.  He underwent drug-coated balloon angioplasty of recurrent in-stent stenosis of the right SFA in May of this year by Dr. Gretta due to a threatened above-the-knee popliteal to TP trunk bypass.  He was seen 3 months ago and noted to have right SFA in-stent velocity of 220 cm/s after drug-coated balloon angioplasty.  Unfortunately ultrasound today shows an increase in the SFA stent velocity to 383 cm/s.  This is concerning for impending thrombosis which threatens the right leg bypass.  We discussed proceeding with angiography again to evaluate and possibly treat this.  We also discussed the durability of the stent  given that he is only 49 years old.  He is aware he may require bypass surgery from the  right common femoral artery in the future.     Thomas Sender, PA-C [facility] [phone]   "

## 2024-04-22 NOTE — Op Note (Addendum)
 "   Patient name: Thomas [name] MRN: [id number] DOB: 12-Apr-1976 Sex: male  04/22/2024 Pre-operative Diagnosis: High-grade right SFA in-stent stenosis Post-operative diagnosis:  Same Surgeon:  Lonni DOROTHA Gaskins, MD Procedure Performed: 1.  Ultrasound-guided access left common femoral artery 2.  Aortogram with catheter selection of aorta 3.  Right lower extremity arteriogram with catheter selection of the right SFA 4.  Right SFA drug-coated balloon angioplasty for in-stent stenosis (5 mm x 80 mm drug-coated Inpact)  5.  30 minutes of monitored moderate conscious sedation time  Indications: Patient is a 49 year old male that has previously undergone extensive right lower extremity intervention including right above-knee popliteal to TP trunk bypass with vein in 2021 in addition to right SFA stenting and right external iliac artery stenting.  He presents today for right lower extremity angiogram with possible invention for an in-stent high-grade right SFA stenosis after risks and  benefits discussed.  Findings:   Ultrasound-guided access left common femoral artery.  Aortogram showed a widely patent right renal with a patent infrarenal aorta.  Both iliacs were patent without flow-limiting stenosis.  The right external iliac stent was widely patent.    On the right the common femoral and profunda were patent without flow-limiting stenosis.  The right SFA stent had haziness suggestive of the high-grade stenosis noted on duplex with velocity 383.  Distally the above-knee popliteal to TP trunk bypass was widely patent with dominant runoff in the PT although there was a atretic peroneal as well.  Ultimately the right SFA in-stent stenosis was treated with drug-coated balloon angioplasty with 5 mm x 80 mm drug-coated Inpact.  Widely patent stent at completion.  Preserved runoff distally with patent bypass.   Procedure:  The patient was identified in the holding area and taken to room 8.  The patient  was then placed supine on the table and prepped and draped in the usual sterile fashion.  A time out was called.  The patient received Versed  and fentanyl  for conscious moderate sedation.  Vital signs are monitored including heart rate, respiratory rate, oxygenation and blood pressure.  I was present for all moderate sedation.  Ultrasound was used to evaluate the left common femoral artery.  It was patent .  A digital ultrasound image was acquired.  A micropuncture needle was used to access the left common femoral artery under ultrasound guidance.  An 018 wire was advanced without resistance and a micropuncture sheath was placed.  The 018 wire was removed and a benson wire was placed.  The micropuncture sheath was exchanged for a 5 french sheath.  An omniflush catheter was advanced over the wire to the level of L-1.  An abdominal angiogram was obtained.  Next, using the omniflush catheter and a benson wire, the aortic bifurcation was crossed and the catheter was placed into theright external iliac artery and right runoff was obtained.  Pertinent findings are noted above.  Ultimately elected for intervention.  Used a Glidewire advantage got down the right SFA through the in-stent stenosis.  I upsized to the 6 French catapult sheath in the left groin over the aortic bifurcation.  The patient was loaded with Angiomax  given pork allergy and we were unable to use heparin .  Ultimately we selected a drug-coated balloon angioplasty using a 5 mm 80 mm drug-coated Inpact inflated to nominal pressure for 3 minutes.  We got final imaging that showed widely patent stent with no residual stenosis with patent bypass and preserved runoff.  Wires and catheters were removed.  A short 6 French sheath was placed in the left groin.  He does have left common femoral disease so elected to take him to holding for sheath removal.   Plan: Continue aspirin  Plavix  statin.  Will arrange follow-up in 1 month with right leg arterial duplex and  ABIs.  Lonni DOROTHA Gaskins, MD [facility] Office: [phone]   "

## 2024-04-22 NOTE — Progress Notes (Signed)
 Discharge instructions reviewed with patient and Friend Majeed via telephone. Denies questions oro concerns. PT ambulated in the hallway, was able to void prior to discharge. PT tolerated PO intake. No s/s of complications at the incision site. PT was escorted from the unit via wheel chair to discharge lobby where he will wait for his friend to pick him up at 5:30 pm.

## 2024-04-24 ENCOUNTER — Encounter (HOSPITAL_COMMUNITY): Payer: Self-pay | Admitting: Vascular Surgery

## 2024-05-06 ENCOUNTER — Other Ambulatory Visit: Payer: Self-pay | Admitting: Vascular Surgery

## 2024-05-06 DIAGNOSIS — I739 Peripheral vascular disease, unspecified: Secondary | ICD-10-CM

## 2024-05-06 DIAGNOSIS — I70213 Atherosclerosis of native arteries of extremities with intermittent claudication, bilateral legs: Secondary | ICD-10-CM

## 2024-06-01 ENCOUNTER — Encounter

## 2024-06-01 ENCOUNTER — Ambulatory Visit (HOSPITAL_COMMUNITY)

## 2024-08-03 ENCOUNTER — Ambulatory Visit: Admitting: Internal Medicine

## 2024-09-17 ENCOUNTER — Ambulatory Visit: Admitting: Medical
